# Patient Record
Sex: Male | Born: 1975 | Race: Black or African American | Hispanic: No | Marital: Single | Smoking: Current every day smoker
Health system: Southern US, Community
[De-identification: ages and names within clinical notes are randomized; demographics above are authoritative.]

## PROBLEM LIST (undated history)

## (undated) DIAGNOSIS — J189 Pneumonia, unspecified organism: Secondary | ICD-10-CM

## (undated) DIAGNOSIS — M869 Osteomyelitis, unspecified: Secondary | ICD-10-CM

## (undated) DIAGNOSIS — M87052 Idiopathic aseptic necrosis of left femur: Secondary | ICD-10-CM

## (undated) DIAGNOSIS — D5700 Hb-SS disease with crisis, unspecified: Secondary | ICD-10-CM

## (undated) DIAGNOSIS — IMO0001 Reserved for inherently not codable concepts without codable children: Secondary | ICD-10-CM

## (undated) DIAGNOSIS — M87059 Idiopathic aseptic necrosis of unspecified femur: Secondary | ICD-10-CM

## (undated) DIAGNOSIS — Z5189 Encounter for other specified aftercare: Secondary | ICD-10-CM

## (undated) HISTORY — PX: BONE GRAFT HIP ILIAC CREST: SUR159

---
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
## 1998-02-10 ENCOUNTER — Inpatient Hospital Stay (HOSPITAL_COMMUNITY): Admission: RE | Admit: 1998-02-10 | Discharge: 1998-02-13 | Payer: Self-pay | Admitting: Family Medicine

## 2000-02-06 ENCOUNTER — Inpatient Hospital Stay (HOSPITAL_COMMUNITY): Admission: EM | Admit: 2000-02-06 | Discharge: 2000-02-07 | Payer: Self-pay | Admitting: Emergency Medicine

## 2000-12-12 ENCOUNTER — Inpatient Hospital Stay (HOSPITAL_COMMUNITY): Admission: EM | Admit: 2000-12-12 | Discharge: 2000-12-19 | Payer: Self-pay

## 2000-12-13 ENCOUNTER — Encounter: Payer: Self-pay | Admitting: Internal Medicine

## 2000-12-16 ENCOUNTER — Encounter: Payer: Self-pay | Admitting: Internal Medicine

## 2000-12-19 ENCOUNTER — Encounter: Payer: Self-pay | Admitting: Internal Medicine

## 2001-04-25 ENCOUNTER — Inpatient Hospital Stay (HOSPITAL_COMMUNITY): Admission: EM | Admit: 2001-04-25 | Discharge: 2001-04-28 | Payer: Self-pay | Admitting: *Deleted

## 2001-06-27 ENCOUNTER — Inpatient Hospital Stay (HOSPITAL_COMMUNITY): Admission: EM | Admit: 2001-06-27 | Discharge: 2001-06-29 | Payer: Self-pay | Admitting: Emergency Medicine

## 2001-09-06 ENCOUNTER — Inpatient Hospital Stay (HOSPITAL_COMMUNITY): Admission: EM | Admit: 2001-09-06 | Discharge: 2001-09-09 | Payer: Self-pay

## 2001-09-06 ENCOUNTER — Encounter: Payer: Self-pay | Admitting: Internal Medicine

## 2002-05-12 ENCOUNTER — Encounter: Payer: Self-pay | Admitting: Emergency Medicine

## 2002-05-12 ENCOUNTER — Inpatient Hospital Stay (HOSPITAL_COMMUNITY): Admission: EM | Admit: 2002-05-12 | Discharge: 2002-05-14 | Payer: Self-pay | Admitting: Emergency Medicine

## 2002-09-30 ENCOUNTER — Encounter: Payer: Self-pay | Admitting: *Deleted

## 2002-09-30 ENCOUNTER — Inpatient Hospital Stay (HOSPITAL_COMMUNITY): Admission: EM | Admit: 2002-09-30 | Discharge: 2002-10-10 | Payer: Self-pay | Admitting: *Deleted

## 2002-10-03 ENCOUNTER — Encounter: Payer: Self-pay | Admitting: Family Medicine

## 2002-10-07 ENCOUNTER — Encounter: Payer: Self-pay | Admitting: Family Medicine

## 2002-10-10 ENCOUNTER — Encounter: Payer: Self-pay | Admitting: Family Medicine

## 2002-11-25 ENCOUNTER — Inpatient Hospital Stay (HOSPITAL_COMMUNITY): Admission: EM | Admit: 2002-11-25 | Discharge: 2002-12-05 | Payer: Self-pay | Admitting: Emergency Medicine

## 2002-11-25 ENCOUNTER — Encounter: Payer: Self-pay | Admitting: Emergency Medicine

## 2002-12-03 ENCOUNTER — Encounter: Payer: Self-pay | Admitting: Family Medicine

## 2003-08-13 ENCOUNTER — Inpatient Hospital Stay (HOSPITAL_COMMUNITY): Admission: EM | Admit: 2003-08-13 | Discharge: 2003-08-22 | Payer: Self-pay | Admitting: Emergency Medicine

## 2003-08-30 ENCOUNTER — Inpatient Hospital Stay (HOSPITAL_COMMUNITY): Admission: EM | Admit: 2003-08-30 | Discharge: 2003-09-02 | Payer: Self-pay | Admitting: Emergency Medicine

## 2003-10-16 ENCOUNTER — Emergency Department (HOSPITAL_COMMUNITY): Admission: EM | Admit: 2003-10-16 | Discharge: 2003-10-16 | Payer: Self-pay | Admitting: Emergency Medicine

## 2003-11-11 ENCOUNTER — Inpatient Hospital Stay (HOSPITAL_COMMUNITY): Admission: EM | Admit: 2003-11-11 | Discharge: 2003-11-26 | Payer: Self-pay | Admitting: Emergency Medicine

## 2003-12-18 ENCOUNTER — Emergency Department (HOSPITAL_COMMUNITY): Admission: EM | Admit: 2003-12-18 | Discharge: 2003-12-18 | Payer: Self-pay | Admitting: Emergency Medicine

## 2004-03-14 ENCOUNTER — Inpatient Hospital Stay (HOSPITAL_COMMUNITY): Admission: EM | Admit: 2004-03-14 | Discharge: 2004-03-17 | Payer: Self-pay | Admitting: *Deleted

## 2004-04-27 HISTORY — PX: JOINT REPLACEMENT: SHX530

## 2004-05-28 ENCOUNTER — Emergency Department (HOSPITAL_COMMUNITY): Admission: EM | Admit: 2004-05-28 | Discharge: 2004-05-28 | Payer: Self-pay | Admitting: Emergency Medicine

## 2004-05-30 ENCOUNTER — Inpatient Hospital Stay (HOSPITAL_COMMUNITY): Admission: EM | Admit: 2004-05-30 | Discharge: 2004-06-03 | Payer: Self-pay | Admitting: Family Medicine

## 2004-10-13 ENCOUNTER — Emergency Department (HOSPITAL_COMMUNITY): Admission: EM | Admit: 2004-10-13 | Discharge: 2004-10-13 | Payer: Self-pay | Admitting: Emergency Medicine

## 2004-11-17 ENCOUNTER — Emergency Department (HOSPITAL_COMMUNITY): Admission: EM | Admit: 2004-11-17 | Discharge: 2004-11-17 | Payer: Self-pay | Admitting: Emergency Medicine

## 2005-01-17 ENCOUNTER — Emergency Department (HOSPITAL_COMMUNITY): Admission: EM | Admit: 2005-01-17 | Discharge: 2005-01-17 | Payer: Self-pay | Admitting: Emergency Medicine

## 2005-02-20 ENCOUNTER — Inpatient Hospital Stay (HOSPITAL_COMMUNITY): Admission: RE | Admit: 2005-02-20 | Discharge: 2005-02-26 | Payer: Self-pay | Admitting: Orthopedic Surgery

## 2005-06-24 ENCOUNTER — Inpatient Hospital Stay (HOSPITAL_COMMUNITY): Admission: EM | Admit: 2005-06-24 | Discharge: 2005-06-27 | Payer: Self-pay | Admitting: Emergency Medicine

## 2005-08-31 ENCOUNTER — Inpatient Hospital Stay (HOSPITAL_COMMUNITY): Admission: EM | Admit: 2005-08-31 | Discharge: 2005-09-01 | Payer: Self-pay | Admitting: Emergency Medicine

## 2005-11-13 ENCOUNTER — Inpatient Hospital Stay (HOSPITAL_COMMUNITY): Admission: EM | Admit: 2005-11-13 | Discharge: 2005-11-14 | Payer: Self-pay | Admitting: Emergency Medicine

## 2006-01-05 ENCOUNTER — Emergency Department (HOSPITAL_COMMUNITY): Admission: EM | Admit: 2006-01-05 | Discharge: 2006-01-05 | Payer: Self-pay | Admitting: Emergency Medicine

## 2006-02-01 ENCOUNTER — Inpatient Hospital Stay (HOSPITAL_COMMUNITY): Admission: EM | Admit: 2006-02-01 | Discharge: 2006-02-03 | Payer: Self-pay | Admitting: Emergency Medicine

## 2006-03-01 ENCOUNTER — Emergency Department (HOSPITAL_COMMUNITY): Admission: EM | Admit: 2006-03-01 | Discharge: 2006-03-02 | Payer: Self-pay | Admitting: Emergency Medicine

## 2006-06-03 ENCOUNTER — Inpatient Hospital Stay (HOSPITAL_COMMUNITY): Admission: EM | Admit: 2006-06-03 | Discharge: 2006-06-05 | Payer: Self-pay | Admitting: Emergency Medicine

## 2006-07-21 ENCOUNTER — Emergency Department (HOSPITAL_COMMUNITY): Admission: EM | Admit: 2006-07-21 | Discharge: 2006-07-22 | Payer: Self-pay | Admitting: Emergency Medicine

## 2006-08-26 ENCOUNTER — Inpatient Hospital Stay (HOSPITAL_COMMUNITY): Admission: EM | Admit: 2006-08-26 | Discharge: 2006-08-30 | Payer: Self-pay | Admitting: Emergency Medicine

## 2006-10-30 ENCOUNTER — Inpatient Hospital Stay (HOSPITAL_COMMUNITY): Admission: EM | Admit: 2006-10-30 | Discharge: 2006-11-02 | Payer: Self-pay | Admitting: Emergency Medicine

## 2010-04-28 ENCOUNTER — Inpatient Hospital Stay (HOSPITAL_COMMUNITY)
Admission: EM | Admit: 2010-04-28 | Discharge: 2010-05-04 | Payer: Self-pay | Source: Home / Self Care | Attending: Internal Medicine | Admitting: Internal Medicine

## 2010-04-30 LAB — BASIC METABOLIC PANEL
BUN: 4 mg/dL — ABNORMAL LOW (ref 6–23)
CO2: 28 mEq/L (ref 19–32)
Calcium: 8.9 mg/dL (ref 8.4–10.5)
Chloride: 101 mEq/L (ref 96–112)
Creatinine, Ser: 0.74 mg/dL (ref 0.4–1.5)
GFR calc Af Amer: >60 mL/min (ref >60)
GFR calc non Af Amer: >60 mL/min (ref >60)
Glucose, Bld: 127 mg/dL — ABNORMAL HIGH (ref 70–99)
Potassium: 3.8 mEq/L (ref 3.5–5.1)
Sodium: 136 mEq/L (ref 135–145)

## 2010-04-30 LAB — CBC
HCT: 29.9 % — ABNORMAL LOW (ref 39.0–52.0)
Hemoglobin: 10.9 g/dL — ABNORMAL LOW (ref 13.0–17.0)
MCH: 34.2 pg — ABNORMAL HIGH (ref 26.0–34.0)
MCHC: 36.5 g/dL — ABNORMAL HIGH (ref 30.0–36.0)
MCV: 93.7 fL (ref 78.0–100.0)
Platelets: 209 10*3/uL (ref 150–400)
RBC: 3.19 MIL/uL — ABNORMAL LOW (ref 4.22–5.81)
RDW: 15.2 % (ref 11.5–15.5)
WBC: 19.6 10*3/uL — ABNORMAL HIGH (ref 4.0–10.5)

## 2010-04-30 LAB — DIFFERENTIAL
Basophils Absolute: 0.1 10*3/uL (ref 0.0–0.1)
Basophils Relative: 0 % (ref 0–1)
Eosinophils Absolute: 0.4 10*3/uL (ref 0.0–0.7)
Eosinophils Relative: 2 % (ref 0–5)
Lymphocytes Relative: 9 % — ABNORMAL LOW (ref 12–46)
Lymphs Abs: 1.8 10*3/uL (ref 0.7–4.0)
Monocytes Absolute: 1.8 10*3/uL — ABNORMAL HIGH (ref 0.1–1.0)
Monocytes Relative: 9 % (ref 3–12)
Neutro Abs: 15.6 10*3/uL — ABNORMAL HIGH (ref 1.7–7.7)
Neutrophils Relative %: 80 % — ABNORMAL HIGH (ref 43–77)

## 2010-05-01 LAB — CBC
HCT: 26.3 % — ABNORMAL LOW (ref 39.0–52.0)
Hemoglobin: 9.6 g/dL — ABNORMAL LOW (ref 13.0–17.0)
MCH: 34 pg (ref 26.0–34.0)
MCHC: 36.5 g/dL — ABNORMAL HIGH (ref 30.0–36.0)
MCV: 93.3 fL (ref 78.0–100.0)
Platelets: 195 10*3/uL (ref 150–400)
RBC: 2.82 MIL/uL — ABNORMAL LOW (ref 4.22–5.81)
RDW: 15.5 % (ref 11.5–15.5)
WBC: 16 10*3/uL — ABNORMAL HIGH (ref 4.0–10.5)

## 2010-05-01 LAB — DIFFERENTIAL
Basophils Absolute: 0.1 10*3/uL (ref 0.0–0.1)
Basophils Relative: 0 % (ref 0–1)
Eosinophils Absolute: 0.7 10*3/uL (ref 0.0–0.7)
Eosinophils Relative: 4 % (ref 0–5)
Lymphocytes Relative: 12 % (ref 12–46)
Lymphs Abs: 1.9 10*3/uL (ref 0.7–4.0)
Monocytes Absolute: 1.6 10*3/uL — ABNORMAL HIGH (ref 0.1–1.0)
Monocytes Relative: 10 % (ref 3–12)
Neutro Abs: 11.8 10*3/uL — ABNORMAL HIGH (ref 1.7–7.7)
Neutrophils Relative %: 74 % (ref 43–77)

## 2010-05-02 LAB — BASIC METABOLIC PANEL
BUN: 4 mg/dL — ABNORMAL LOW (ref 6–23)
CO2: 27 mEq/L (ref 19–32)
Calcium: 9 mg/dL (ref 8.4–10.5)
Chloride: 105 mEq/L (ref 96–112)
Creatinine, Ser: 0.61 mg/dL (ref 0.4–1.5)
GFR calc Af Amer: >60 mL/min (ref >60)
GFR calc non Af Amer: >60 mL/min (ref >60)
Glucose, Bld: 89 mg/dL (ref 70–99)
Potassium: 4.1 mEq/L (ref 3.5–5.1)
Sodium: 141 mEq/L (ref 135–145)

## 2010-05-02 LAB — CBC
HCT: 24.6 % — ABNORMAL LOW (ref 39.0–52.0)
Hemoglobin: 9.1 g/dL — ABNORMAL LOW (ref 13.0–17.0)
MCH: 34.1 pg — ABNORMAL HIGH (ref 26.0–34.0)
MCHC: 37 g/dL — ABNORMAL HIGH (ref 30.0–36.0)
MCV: 92.1 fL (ref 78.0–100.0)
Platelets: 190 10*3/uL (ref 150–400)
RBC: 2.67 MIL/uL — ABNORMAL LOW (ref 4.22–5.81)
RDW: 15.9 % — ABNORMAL HIGH (ref 11.5–15.5)
WBC: 16.5 10*3/uL — ABNORMAL HIGH (ref 4.0–10.5)

## 2010-05-12 LAB — CULTURE, BLOOD (ROUTINE X 2)
Culture  Setup Time: 201201040601
Culture  Setup Time: 201201040601
Culture: NO GROWTH
Culture: NO GROWTH

## 2010-06-05 NOTE — H&P (Signed)
NAMEDONTEE, JASO                ACCOUNT NO.:  [id number]  MEDICAL RECORD NO.:  [id number]          PATIENT TYPE:  EMS  LOCATION:  ED                           FACILITY:  [facility]  PHYSICIAN:  Calvert Cantor, M.D.     DATE OF BIRTH:  [birth year]  DATE OF ADMISSION:  04/28/2010 DATE OF DISCHARGE:                             HISTORY & PHYSICAL   PRIMARY CARE PHYSICIAN:  Fleet Contras, M.D.  PRESENTING COMPLAINT:  Chest pain.  HISTORY OF PRESENT ILLNESS:  This is a 35 year old male with sickle cell anemia, who comes in with a complaint of diffuse chest pain which started earlier today, described as 10/10.  He does have any other associated symptoms such as cough or shortness of breath, but he is having trouble taking a deep breath.  He has not had any fevers and has no other complaints.  The patient is being admitted for a sickle cell crisis.  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  FAMILY HISTORY:  Diabetes.  SOCIAL HISTORY:  Quit smoking on New Year's Eve, was smoking about a pack a day.  Drinks alcohol occasionally.  Does not admit to any drug use.  ALLERGIES:  No known drug allergies.  CURRENT MEDICATIONS:  None.  REVIEW OF SYSTEMS:  No recent weight loss or weight gain.  No complaints of headaches.  No blurred vision, double vision.  No sore throat, sinus trouble, earache.  RESPIRATORY:  No cough or shortness of breath. CARDIAC:  Positive for diffuse chest pain.  No palpitations.  No pedal edema.  GI:  No nausea, vomiting, diarrhea, or constipation. NEUROLOGICAL:  No focal numbness, weakness, tingling.  PSYCHOLOGICAL: No anxiety, depression.  PHYSICAL EXAMINATION:  GENERAL:  Young male lying in bed in moderate distress. VITAL SIGNS:  Blood pressure 141/93, pulse 84, respiratory rate 18, temperature 98.4, oxygen 97%. HEENT:  Pupils equal, round, reactive to light.  Extraocular  movements are intact.  Conjunctivae are pink.  No scleral icterus.  Oral mucosa is moist.  Normal dentition. NECK:  Supple.  No thyromegaly, lymphadenopathy, or carotid bruits. HEART:  Regular rate and rhythm.  No murmurs, rubs, or gallops. LUNGS:  Decreased breath sounds due to poor inspiration effort.  No rhonchi or wheezing. ABDOMEN:  Soft, nontender, nondistended.  Bowel sounds positive.  No organomegaly. EXTREMITIES:  No cyanosis, clubbing, or edema. NEUROLOGICAL:  Cranial nerves II through XII intact.  Strength intact in all 4 extremities. PSYCHOLOGICAL:  Awake, alert, oriented x3. SKIN:  Warm, dry.  No rash or bruising.  BLOOD WORK:  Abnormal blood work includes a WBC count of 15.2 and a hemoglobin of 11.6.  His retic count is normal at 2.1.  Metabolic panel is essentially normal.  He has had a chest x-ray 2-view which reveals scarring versus atelectasis in the left lung base and scarring in the right midlung. There is patchy sclerosis of humeral head suggestive of prior avascular necrosis.  ASSESSMENT AND PLAN: 1. Sickle cell crisis.  We will treat this with oxycodone and     Dilaudid,  IV fluids and oxygen.  We will need to monitor his     hemoglobin.  I am afraid that he is going to splint and may develop     a respiratory infection.  Therefore, once pain is controlled, we     will need to start incentive spirometry to prevent further     atelectasis. 2. Leukocytosis, possibly stress response.  We will go ahead and get a     UA to ensure that there is no infection and monitor for fevers as     well. 3. Anemia.  This is mild, continue to follow. 4. History of avascular necrosis status post bilateral hip     replacements. 5. Smoker.  We will get him a nicotine patch as he is trying to quit. 6. Deep venous thrombosis prophylaxis will be with Lovenox.  Time on admission 45 minutes.     Calvert Cantor, M.D.     SR/MEDQ  D:  04/28/2010  T:  04/28/2010  Job:   [id number]  cc:   Fleet Contras, M.D. Fax: [phone]  Electronically Signed by Calvert Cantor M.D. on 06/05/2010 12:21:38 PM

## 2010-07-07 LAB — BASIC METABOLIC PANEL
BUN: 16 mg/dL (ref 6–23)
BUN: 9 mg/dL (ref 6–23)
CO2: 27 mEq/L (ref 19–32)
CO2: 28 mEq/L (ref 19–32)
Calcium: 9.3 mg/dL (ref 8.4–10.5)
Chloride: 104 mEq/L (ref 96–112)
GFR calc non Af Amer: >60 mL/min (ref >60)
Glucose, Bld: 109 mg/dL — ABNORMAL HIGH (ref 70–99)
Glucose, Bld: 86 mg/dL (ref 70–99)
Potassium: 3.9 mEq/L (ref 3.5–5.1)
Potassium: 4.3 mEq/L (ref 3.5–5.1)
Sodium: 139 mEq/L (ref 135–145)
Sodium: 140 mEq/L (ref 135–145)

## 2010-07-07 LAB — URINALYSIS, ROUTINE W REFLEX MICROSCOPIC
Nitrite: NEGATIVE
Specific Gravity, Urine: 1.033 — ABNORMAL HIGH (ref 1.005–1.030)
Urobilinogen, UA: 1 mg/dL (ref 0.0–1.0)
pH: 6 (ref 5.0–8.0)

## 2010-07-07 LAB — CBC
Hemoglobin: 10.7 g/dL — ABNORMAL LOW (ref 13.0–17.0)
MCV: 94.3 fL (ref 78.0–100.0)
Platelets: 239 10*3/uL (ref 150–400)
Platelets: 301 10*3/uL (ref 150–400)
RBC: 3.36 MIL/uL — ABNORMAL LOW (ref 4.22–5.81)
WBC: 15.2 10*3/uL — ABNORMAL HIGH (ref 4.0–10.5)

## 2010-07-07 LAB — DIFFERENTIAL
Eosinophils Relative: 4 % (ref 0–5)
Lymphocytes Relative: 21 % (ref 12–46)
Lymphs Abs: 3.2 10*3/uL (ref 0.7–4.0)
Monocytes Relative: 11 % (ref 3–12)
Neutro Abs: 9.5 10*3/uL — ABNORMAL HIGH (ref 1.7–7.7)
Neutrophils Relative %: 63 % (ref 43–77)

## 2010-07-07 LAB — RAPID URINE DRUG SCREEN, HOSP PERFORMED
Cocaine: NOT DETECTED
Opiates: POSITIVE — AB

## 2010-07-09 ENCOUNTER — Inpatient Hospital Stay (HOSPITAL_COMMUNITY)
Admission: EM | Admit: 2010-07-09 | Discharge: 2010-07-18 | DRG: 812 | Disposition: A | Discharge status: Home / Self Care | Payer: Medicare Other | Attending: Internal Medicine | Admitting: Internal Medicine

## 2010-07-09 DIAGNOSIS — M79609 Pain in unspecified limb: Secondary | ICD-10-CM | POA: Diagnosis present

## 2010-07-09 DIAGNOSIS — D5700 Hb-SS disease with crisis, unspecified: Principal | ICD-10-CM | POA: Diagnosis present

## 2010-07-09 DIAGNOSIS — K5900 Constipation, unspecified: Secondary | ICD-10-CM | POA: Diagnosis present

## 2010-07-09 DIAGNOSIS — D72829 Elevated white blood cell count, unspecified: Secondary | ICD-10-CM | POA: Diagnosis present

## 2010-07-09 DIAGNOSIS — F17200 Nicotine dependence, unspecified, uncomplicated: Secondary | ICD-10-CM | POA: Diagnosis present

## 2010-07-09 LAB — CBC
HCT: 34.6 % — ABNORMAL LOW (ref 39.0–52.0)
MCH: 33.7 pg (ref 26.0–34.0)
MCV: 92.5 fL (ref 78.0–100.0)
Platelets: 268 10*3/uL (ref 150–400)
RBC: 3.74 MIL/uL — ABNORMAL LOW (ref 4.22–5.81)

## 2010-07-09 LAB — URINALYSIS, ROUTINE W REFLEX MICROSCOPIC
Glucose, UA: NEGATIVE mg/dL
Hgb urine dipstick: NEGATIVE
Specific Gravity, Urine: 1.009 (ref 1.005–1.030)
pH: 6.5 (ref 5.0–8.0)

## 2010-07-09 LAB — DIFFERENTIAL
Eosinophils Absolute: 0.9 10*3/uL — ABNORMAL HIGH (ref 0.0–0.7)
Eosinophils Relative: 8 % — ABNORMAL HIGH (ref 0–5)
Lymphocytes Relative: 23 % (ref 12–46)
Lymphs Abs: 2.8 10*3/uL (ref 0.7–4.0)
Monocytes Relative: 10 % (ref 3–12)
Neutrophils Relative %: 59 % (ref 43–77)

## 2010-07-09 LAB — RETICULOCYTES: Retic Ct Pct: 2.4 % (ref 0.4–3.1)

## 2010-07-09 LAB — BASIC METABOLIC PANEL
BUN: 10 mg/dL (ref 6–23)
CO2: 28 mEq/L (ref 19–32)
Chloride: 104 mEq/L (ref 96–112)
Creatinine, Ser: 0.63 mg/dL (ref 0.4–1.5)
Glucose, Bld: 81 mg/dL (ref 70–99)

## 2010-07-10 LAB — DIFFERENTIAL
Basophils Absolute: 0.1 10*3/uL (ref 0.0–0.1)
Eosinophils Relative: 8 % — ABNORMAL HIGH (ref 0–5)
Lymphocytes Relative: 31 % (ref 12–46)
Monocytes Relative: 10 % (ref 3–12)
Neutrophils Relative %: 50 % (ref 43–77)

## 2010-07-10 LAB — CBC
Hemoglobin: 10.4 g/dL — ABNORMAL LOW (ref 13.0–17.0)
MCH: 34 pg (ref 26.0–34.0)
Platelets: 213 10*3/uL (ref 150–400)
RBC: 3.06 MIL/uL — ABNORMAL LOW (ref 4.22–5.81)

## 2010-07-10 LAB — RETICULOCYTES
RBC.: 3.06 MIL/uL — ABNORMAL LOW (ref 4.22–5.81)
Retic Ct Pct: 3 % (ref 0.4–3.1)

## 2010-07-11 LAB — RETICULOCYTES
Retic Count, Absolute: 77 10*3/uL (ref 19.0–186.0)
Retic Ct Pct: 2.4 % (ref 0.4–3.1)

## 2010-07-11 LAB — CBC
MCH: 33 pg (ref 26.0–34.0)
MCV: 93.5 fL (ref 78.0–100.0)
Platelets: 225 10*3/uL (ref 150–400)
RBC: 3.21 MIL/uL — ABNORMAL LOW (ref 4.22–5.81)
RDW: 15.5 % (ref 11.5–15.5)
WBC: 10.3 10*3/uL (ref 4.0–10.5)

## 2010-07-11 LAB — LACTATE DEHYDROGENASE: LDH: 149 U/L (ref 94–250)

## 2010-07-12 LAB — CBC
Hemoglobin: 10.5 g/dL — ABNORMAL LOW (ref 13.0–17.0)
MCH: 33.2 pg (ref 26.0–34.0)
MCHC: 35.6 g/dL (ref 30.0–36.0)
MCV: 93.4 fL (ref 78.0–100.0)
RBC: 3.16 MIL/uL — ABNORMAL LOW (ref 4.22–5.81)

## 2010-07-13 LAB — CBC
HCT: 29.2 % — ABNORMAL LOW (ref 39.0–52.0)
MCH: 33.2 pg (ref 26.0–34.0)
MCHC: 35.6 g/dL (ref 30.0–36.0)
MCV: 93.3 fL (ref 78.0–100.0)
Platelets: 243 10*3/uL (ref 150–400)
RDW: 15.6 % — ABNORMAL HIGH (ref 11.5–15.5)

## 2010-07-14 LAB — CROSSMATCH
Antibody Screen: NEGATIVE
Unit division: 0

## 2010-07-14 LAB — CBC
HCT: 29.9 % — ABNORMAL LOW (ref 39.0–52.0)
MCH: 32.1 pg (ref 26.0–34.0)
MCHC: 35.1 g/dL (ref 30.0–36.0)
MCV: 91.4 fL (ref 78.0–100.0)
RDW: 15.9 % — ABNORMAL HIGH (ref 11.5–15.5)
WBC: 9.5 10*3/uL (ref 4.0–10.5)

## 2010-07-15 LAB — CBC
Hemoglobin: 10.5 g/dL — ABNORMAL LOW (ref 13.0–17.0)
MCH: 32.5 pg (ref 26.0–34.0)
MCHC: 35.1 g/dL (ref 30.0–36.0)
RDW: 15.9 % — ABNORMAL HIGH (ref 11.5–15.5)

## 2010-07-15 LAB — RETICULOCYTES: Retic Ct Pct: 2.6 % (ref 0.4–3.1)

## 2010-07-15 NOTE — H&P (Addendum)
[name], [name]                ACCOUNT NO.:  [id number]  MEDICAL RECORD NO.:  [id number]           PATIENT TYPE:  E  LOCATION:  WLED                         FACILITY:  [facility]  PHYSICIAN:  Homero Fellers, MD   DATE OF BIRTH:  09-Feb-1976  DATE OF ADMISSION:  07/09/2010 DATE OF DISCHARGE:                             HISTORY & PHYSICAL   PRIMARY CARE PHYSICIAN:  Fleet Contras, M.D.  The patient is being admitted to [facility].  CHIEF COMPLAINT:  Right arm pain.  HISTORY OF PRESENT ILLNESS:  [name] is a very pleasant 35 year old male with a history of sickle cell anemia who presents to the [facility] ED with a chief complaint of right arm pain.  Information is obtained from the patient.  He states that 2 days ago he developed a sharp, nonradiating pain in his right shoulder that was worse with movement.  He indicates that over the last 2 days this pain has worsened in intensity and spread to diffusely throughout the arm and down to his right wrist and right hand.  He does indicate that he has had the same type of pain in previous sickle cell crises, but it has never gone down to his hand.  He indicates that holding his arm up over his head is the most comfortable position and down by his side is the worst.  He rates the pain at 10/10 on presentation and at the time of my interview after pain medication an 8/10.  The patient does deny recent fever, chills, nausea, vomiting, diarrhea.  He also denies any chest pain, palpitation, cough, shortness of breath.  He indicates that his last crisis was in January of this year.  He was hospitalized at [facility] for 5 days at that time.  He indicates that he is compliant with his medications and that he quit smoking in January and has been wearing the patch ever since.  He continued with his home medications that he uses for pain without relief.  Today he awakened and the pain was so intense he was unable  to use his arm.  He decided to come to the emergency room.  ALLERGIES:  NO KNOWN DRUG ALLERGIES.  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  SOCIAL HISTORY:  He is single.  He quit smoking in January of this year. Prior to that he smoked about a pack a day for many years.  He has an occasional EtOH.  He denies any drug use.  FAMILY HISTORY:  Family medical history includes diabetes.  MEDICATIONS: 1. Phenergan 25 mg p.o. one tablet every 12 hours as needed for     nausea. 2. Nicotine patch 14 mg/24 hours transdermal one patch daily. 3. Morphine sulfate 30 mg p.o. one tablet every 4 hours as needed for     pain. 4. Ibuprofen 800 mg p.o. t.i.d. as needed for pain. 5. Hydroxyurea 500 mg p.o. daily. 6. Folic acid 1 mg p.o. daily. 7. Albuterol inhaler two puffs every 4 hours as needed  for shortness     of breath. 8. Morphine sulfate CR 60 mg p.o. one tablet every 12 hours.  REVIEW OF SYSTEMS:  GENERAL:  Denies fever, chills, anorexia or unintentional weight loss.  ENT:  Denies any ear pain, nasal congestion or sore throat.  CARDIOVASCULAR:  Denies chest pain, palpitations or lower extremity edema.  RESPIRATORY:  Denies any shortness of breath or cough.  MUSCULOSKELETAL:  See HPI.  NEUROLOGIC:  Denies headache, visual disturbances, numbness or tingling of extremities.  GASTROINTESTINAL: Denies any abdominal pain, nausea, vomiting, diarrhea or constipation. GENITOURINARY:  Denies dysuria, hematuria, frequency or urgency. PSYCHIATRIC:  Denies depression or anxiety.  HEMATOLOGIC:  Denies any unusual bruising or bleeding.  LABORATORY DATA:  Sodium 137, potassium 3.6, chloride 104, CO2 28, BUN 10, creatinine 0.63, glucose 81.  WBC is 12.0, hemoglobin 12.6, hematocrit 34.6, platelets 268, reticulocyte percentage 2.4, RBCs 3.77, absolute reticulocytes 90.5.  RADIOLOGIC:  EKG reviewed by Dr. Phineas Douglas  shows normal sinus rhythm at 60 a minute.  PHYSICAL EXAMINATION:  VITAL SIGNS:  Temperature 98.0, blood pressure 106/68, heart rate is 62, respiratory rate is 18, saturations 96% on room air. GENERAL:  Awake, alert, sitting up in bed, well-nourished, well- hydrated, in no acute distress. HEENT:  Head normocephalic, atraumatic.  Pupils equal, round and reactive to light.  EOMI.  Mucous membranes of his mouth are pink, slightly dry.  No obvious lesion or exudate in nose or ears. NECK:  Supple.  No JVD.  Full range of motion.  No lymphadenopathy. CARDIOVASCULAR:  Regular rate and rhythm.  No murmur, gallop or rub.  No lower extremity edema.  Pedal pulses present and palpable. RESPIRATORY:  No increased work of breathing.  Breath sounds clear to auscultation bilaterally.  No rhonchi, wheezes or rales. ABDOMEN:  Flat, soft, positive bowel sounds throughout.  Nontender to palpation.  No mass or organomegaly noted. MUSCULOSKELETAL:  Moves all extremities.  Joints without swelling/erythema.  Right shoulder without swelling or erythema.  Full range of motion.  Bilateral upper extremity strength 5/5. EXTREMITIES:  Without clubbing or cyanosis.  ASSESSMENT AND PLAN: 1. Sickle cell crisis.  Admit to floor.  We will hydrate vigorously     with half normal saline.  Provide oxygen support and provide pain     medicine, specifically Dilaudid 2 mg intravenous every two hours.     We will continue his home MS Contin at 30 mg per oral every 12.  We     will monitor effectiveness of this pain regimen and adjust as     needed.  We will monitor his hemoglobin. 2. Leukocytosis.  White count was 12.0.  Probably stress response.     The patient is currently afebrile with normal sinus rhythm.  We     will check a urinalysis.  No symptoms of cough or shortness of     breath.  We will monitor closely for fever. 3. Anemia.  Mild.  Hemoglobin 12.6.  We will monitor closely. 4. History of avascular necrosis  status post bilateral hip     replacement. 5. Former smoker.  The patient quit smoking in January and currently     uses nicotine patch.  We will continue. 6. Deep vein thrombosis prophylaxis.  We will use Lovenox. 7. Code status.  The patient is a Full Code.  This assessment and plan was discussed with Dr. Phineas Douglas.  It was truly a pleasure taking care of Mr. Joos.     Gwenyth Bender, NP  ______________________________ Homero Fellers, MD    KMB/MEDQ  D:  07/09/2010  T:  07/09/2010  Job:  [id number]  cc:   Fleet Contras, M.D. Fax: [phone]  Electronically Signed by Homero Fellers  on 07/15/2010 02:16:12 AM Electronically Signed by Toya Smothers  on 07/17/2010 10:42:34 AM

## 2010-07-15 NOTE — Progress Notes (Signed)
  [name], [name]                ACCOUNT NO.:  [id number]  MEDICAL RECORD NO.:  [id number]           PATIENT TYPE:  I  LOCATION:  1339                         FACILITY:  [facility]  PHYSICIAN:  Hillery Aldo, M.D.   DATE OF BIRTH:  [birth year]                                PROGRESS NOTE   DATE OF DISCHARGE: Pending.  PRIMARY CARE PHYSICIAN: Fleet Contras, M.D.  CURRENT DIAGNOSES: 1. Sickle cell anemia with vaso-occlusive crisis. 2. Constipation. 3. 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 4. Leukocytosis. 5. History of tobacco abuse.  DISCHARGE MEDICATIONS: Will be dictated at the time of actual discharge.  CONSULTATIONS: None.  BRIEF ADMISSION HISTORY OF PRESENT ILLNESS: The patient is a 35 year old male with past medical history of sickle cell anemia who presented to the hospital with a chief complaint of severe right upper extremity pain consistent with his usual presentation of vaso-occlusive crisis.  When his pain could not be adequately controlled in the emergency department, he was referred to the hospitalist service for further inpatient evaluation and stabilization. For full details, please see the dictated report done by Dr. Phineas Douglas.  PROCEDURES AND DIAGNOSTIC STUDIES: None.  DISCHARGE LABORATORY VALUES: Will be dictated at the time of actual discharge.  HOSPITAL COURSE: 1. Sickle cell anemia/vaso-occlusive crisis:  The patient was admitted     and put on a combination of IV fluids, intravenous pain     medications, and p.o. folic acid.  Because of ongoing symptoms of     severe right upper extremity pain, the patient was given an     exchange transfusion on July 13, 2010, which had a mild     improvement in his presenting complaints.  At this point, the     patient is still requiring IV narcotics for pain control and is not     yet stable for discharge.  Discharge summary addendum will be     dictated when he is actually discharged. 2.  Constipation:  The patient responds to sorbitol p.r.n. 3. Anemia secondary to sickle cell anemia:  The patient is to receive     exchange transfusion of 350 cc.  Hemoglobin has been stable. 4. Former tobacco abuse:  The patient has been maintained on nicotine     patch.  DISPOSITION: The patient will be discharged home once he is successfully weaned from IV narcotics and his pain can be adequately controlled with p.o. narcotics.  A discharge summary addendum will be dictated at that time.     Hillery Aldo, M.D.     CR/MEDQ  D:  07/15/2010  T:  07/15/2010  Job:  [id number]  cc:   Fleet Contras, M.D. Fax: [phone]  Electronically Signed by Hillery Aldo M.D. on 07/15/2010 03:31:13 PM

## 2010-07-16 LAB — BASIC METABOLIC PANEL
BUN: 9 mg/dL (ref 6–23)
Calcium: 9.1 mg/dL (ref 8.4–10.5)
GFR calc non Af Amer: >60 mL/min (ref >60)
Glucose, Bld: 100 mg/dL — ABNORMAL HIGH (ref 70–99)
Potassium: 4.1 mEq/L (ref 3.5–5.1)

## 2010-07-16 LAB — CBC
HCT: 28.2 % — ABNORMAL LOW (ref 39.0–52.0)
MCHC: 35.5 g/dL (ref 30.0–36.0)
MCV: 92.5 fL (ref 78.0–100.0)
RDW: 15.8 % — ABNORMAL HIGH (ref 11.5–15.5)

## 2010-07-17 LAB — COMPREHENSIVE METABOLIC PANEL
ALT: 33 U/L (ref 0–53)
AST: 50 U/L — ABNORMAL HIGH (ref 0–37)
Albumin: 3.9 g/dL (ref 3.5–5.2)
Alkaline Phosphatase: 83 U/L (ref 39–117)
CO2: 28 mEq/L (ref 19–32)
Chloride: 103 mEq/L (ref 96–112)
Creatinine, Ser: 0.77 mg/dL (ref 0.4–1.5)
GFR calc Af Amer: >60 mL/min (ref >60)
GFR calc non Af Amer: >60 mL/min (ref >60)
Potassium: 4.2 mEq/L (ref 3.5–5.1)
Sodium: 138 mEq/L (ref 135–145)
Total Bilirubin: 2.2 mg/dL — ABNORMAL HIGH (ref 0.3–1.2)

## 2010-07-17 LAB — DIFFERENTIAL
Basophils Absolute: 0.2 10*3/uL — ABNORMAL HIGH (ref 0.0–0.1)
Basophils Absolute: 0.1 10*3/uL (ref 0.0–0.1)
Basophils Relative: 2 % — ABNORMAL HIGH (ref 0–1)
Basophils Relative: 1 % (ref 0–1)
Eosinophils Absolute: 1 10*3/uL — ABNORMAL HIGH (ref 0.0–0.7)
Eosinophils Relative: 9 % — ABNORMAL HIGH (ref 0–5)
Lymphs Abs: 3.2 10*3/uL (ref 0.7–4.0)
Monocytes Absolute: 1.4 10*3/uL — ABNORMAL HIGH (ref 0.1–1.0)
Monocytes Relative: 13 % — ABNORMAL HIGH (ref 3–12)
Monocytes Relative: 13 % — ABNORMAL HIGH (ref 3–12)
Neutro Abs: 3.4 10*3/uL (ref 1.7–7.7)

## 2010-07-17 LAB — CBC
HCT: 28.1 % — ABNORMAL LOW (ref 39.0–52.0)
MCH: 32.4 pg (ref 26.0–34.0)
MCH: 32.2 pg (ref 26.0–34.0)
MCHC: 35.4 g/dL (ref 30.0–36.0)
MCV: 91.8 fL (ref 78.0–100.0)
Platelets: 246 10*3/uL (ref 150–400)
RBC: 3.06 MIL/uL — ABNORMAL LOW (ref 4.22–5.81)
RDW: 15.7 % — ABNORMAL HIGH (ref 11.5–15.5)

## 2010-07-18 LAB — DIFFERENTIAL
Basophils Absolute: 0.1 10*3/uL (ref 0.0–0.1)
Basophils Relative: 1 % (ref 0–1)
Eosinophils Absolute: 1.1 10*3/uL — ABNORMAL HIGH (ref 0.0–0.7)
Neutro Abs: 5.1 10*3/uL (ref 1.7–7.7)
Neutrophils Relative %: 50 % (ref 43–77)

## 2010-07-18 LAB — CBC
Hemoglobin: 9.8 g/dL — ABNORMAL LOW (ref 13.0–17.0)
MCH: 32.2 pg (ref 26.0–34.0)
MCHC: 34.9 g/dL (ref 30.0–36.0)
Platelets: 239 10*3/uL (ref 150–400)
RBC: 3.04 MIL/uL — ABNORMAL LOW (ref 4.22–5.81)

## 2010-07-21 LAB — PROTEIN ELECTROPH W RFLX QUANT IMMUNOGLOBULINS
Alpha-2-Globulin: 7.5 % (ref 7.1–11.8)
Gamma Globulin: 17.6 % (ref 11.1–18.8)
M-Spike, %: NOT DETECTED g/dL
Total Protein ELP: 7.5 g/dL (ref 6.0–8.3)

## 2010-08-05 NOTE — Discharge Summary (Signed)
[name], BOEHNING                ACCOUNT NO.:  [id number]  MEDICAL RECORD NO.:  [id number]           PATIENT TYPE:  I  LOCATION:  1339                         [facility]ILITY:  [facility]  PHYSICIAN:  Altha Harm, MDDATE OF BIRTH:  [birth year]  DATE OF ADMISSION:  07/09/2010 DATE OF DISCHARGE:  07/18/2010                              DISCHARGE SUMMARY   DISCHARGE DISPOSITION:  Home.  FINAL DISCHARGE DIAGNOSES: 1. Exacerbation of hemoglobin SS with vaso-occlusive crisis. 2. Right arm pain secondary to exacerbation of hemoglobin SS with vaso-     occlusive crisis. 3. Anemia status post exchange transfusion secondary to hemoglobin SS     vaso-occlusive episode. 4. Tobacco use disorder.  DISCHARGE MEDICATIONS:  Include the following: 1. Multivitamin with minerals 1 tablet p.o. daily. 2. Senokot 1 to 2 tabs p.o. as needed for constipation by mouth. 3. Albuterol inhaler 2 puffs p.o. q.4 h p.r.n. shortness of breath. 4. Folic acid 1 mg p.o. daily. 5. Hydrea 500 mg p.o. daily. 6. Ibuprofen 800 mg p.o. t.i.d. p.r.n. pain. 7. Morphine sulfate controlled release 60 mg p.o. q.12 h 8. Morphine sulfate instant release 30 mg p.o. q.4 h p.r.n. pain. 9. Nicotine patch 40 mg of 24 hours transdermally applied daily. 10.Phenergan 25 mg p.o. q.12 h p.r.n. nausea.  CONSULTANTS:  None.  PROCEDURES:  Status post exchange transfusion of 350 cc.  DIAGNOSTIC STUDIES:  None.  PRIMARY CARE PHYSICIAN:  Fleet Contras, M.D.  CODE STATUS:  Full code.  ALLERGIES:  No known drug allergies.  CHIEF COMPLAINT:  Right arm pain.  HISTORY OF PRESENT ILLNESS:  Please refer to the H and P dictated by Dr. Phineas Douglas for details of the HPI; however, in short, this is a 35 year old gentleman with a history of hemoglobin SS who presented to the hospital with a chief complaint of severe right upper extremity pain consistent with his usual presentation of vaso-occlusive episodes.  His pain could not be adequately  controlled in the emergency department and he was referred to the [facility] for further inpatient evaluation and stabilization.  HOSPITAL COURSE: 1. Hemoglobin SS with vaso-occlusive episode leading to right arm     pain.  The patient was started on IV hydration and analgesics.  The     patient was given long-acting morphine sulfate in addition to IV     Dilaudid.  The patient had minimal control of his pain and required     an exchange transfusion.  When his pain was not adequately     controlled and it did not appear to be preliminary response, the     patient received an exchange transfusion of 350 cc of blood.  He     had some improvement in pain.  It was noticed that the patient was     likely being on a dose of his narcotics as he normally takes the MS     Contin 60 mg q.12 h.  The long-acting was increased to 60 mg q.12 h     and the patient was resumed on his usual oral medication for     breakthrough pain.  He required minimal doses of Dilaudid once that     oral medication was increased.  The patient was then in the last 12     hours able to tolerate his pain without any need for IV     medications.  The patient states that his pain is usually 0 to 4 at     home and with his medications today, his pain has been down to     about the level of 4.  The patient is actually requesting to go     home and feels that he cannot adequately manage his pain at home.     During this hospitalization, the patient also received 24 hours of     Toradol on q.6 basis in place of his ibuprofen which appeared to     have assisted in ameliorating his pain.  The patient is tolerating     diet well.  He is able to ambulate without any difficulty.  He has     had no need for supplemental oxygen and reports that he is     comfortable at this time. 2. Anemia.  See above. 3. Tobacco use disorder.  The patient has been counseled against     further tobacco use and he is on a nicotine  patch at home.  LABORATORY STUDIES:  At the time of discharge, white blood cell count 10.4, hemoglobin 9.8, hematocrit 24.1, platelet count 239.  Reticulocyte percentage 2.9, absolute reticulocyte 88.7.  CONDITION AT TIME OF DISCHARGE:  Stable.  DISCHARGE PHYSICAL EXAMINATION:  VITAL SIGNS:  Temperature is 98.7, heart rate 73, blood pressure 110/82, respiratory rate 20, O2 sats are 96% on room air. GENERAL:  In general, the patient is well appearing. HEENT EXAMINATION:  He is normocephalic, atraumatic.  Pupils are equally round and reactive to light and accommodation.  Extraocular movements are intact.  Oropharynx is moist.  No exudate, erythema or lesions are noted. NECK EXAMINATION:  Trachea is midline.  No masses, no thyromegaly, no JVD, no carotid bruit. LUNGS:  Clear to auscultation.  No wheezing or rhonchi noted. CARDIOVASCULAR EXAM:  Normal S1 and S2.  No murmurs, rubs or gallops. PMI is nondisplaced.  No heaves or thrills on palpation. ABDOMEN:  Obese, soft, nontender, nondistended.  No masses, no hepatosplenomegaly noted. EXTREMITIES:  Showed no clubbing, cyanosis or edema. MUSCULOSKELETAL:  No warmth, swelling or erythema around the joints and the patient has full range of motion.  DIETARY RESTRICTIONS:  None.  PHYSICAL RESTRICTIONS:  None.  FOLLOWUP:  The patient to follow up with Dr. Concepcion Elk in 1 week.  Total time to coordinate this discharge including face-to-face time approximately 40 minutes.     Altha Harm, MD     MAM/MEDQ  D:  07/18/2010  T:  07/19/2010  Job:  [id number]  cc:   Fleet Contras, M.D. Fax: [phone]  Electronically Signed by Marthann Schiller MD on 08/05/2010 08:26:07 PM

## 2010-09-10 ENCOUNTER — Emergency Department (HOSPITAL_COMMUNITY)
Admission: EM | Admit: 2010-09-10 | Discharge: 2010-09-10 | Disposition: A | Discharge status: Home / Self Care | Payer: Medicare Other | Attending: Emergency Medicine | Admitting: Emergency Medicine

## 2010-09-10 DIAGNOSIS — R040 Epistaxis: Secondary | ICD-10-CM | POA: Insufficient documentation

## 2010-09-10 DIAGNOSIS — M549 Dorsalgia, unspecified: Secondary | ICD-10-CM | POA: Insufficient documentation

## 2010-09-10 DIAGNOSIS — D571 Sickle-cell disease without crisis: Secondary | ICD-10-CM | POA: Insufficient documentation

## 2010-09-10 DIAGNOSIS — R51 Headache: Secondary | ICD-10-CM | POA: Insufficient documentation

## 2010-09-10 LAB — CBC
Hemoglobin: 12.1 g/dL — ABNORMAL LOW (ref 13.0–17.0)
MCH: 33.2 pg (ref 26.0–34.0)
Platelets: 292 10*3/uL (ref 150–400)
RBC: 3.64 MIL/uL — ABNORMAL LOW (ref 4.22–5.81)
WBC: 13.6 10*3/uL — ABNORMAL HIGH (ref 4.0–10.5)

## 2010-09-12 NOTE — Discharge Summary (Signed)
[facility]  Patient:    [name], [name] Visit Number: [id number] MRN: [id number]          Service Type: MED Location: [id number] Attending Physician:  Rosanne Sack Dictated by:   Rosanne Sack, M.D. Admit Date:  04/25/2001 Discharge Date: 04/28/2001   CC:         Meredith Staggers, M.D.   Discharge Summary  DATE OF BIRTH:  1975/06/10.  ATTENDING PHYSICIAN:  Rosanne Sack, M.D.  DISCHARGE DIAGNOSES: 1. Acute sickle cell crisis. 2. Macrocytic anemia, secondary to problem #1.    a. Discharge hemoglobin 12.1, MCV 98. 3. History of sickle cell anemia. 4. History of trilobar community acquired pneumonia in August 2002.  DISCHARGE MEDICATIONS: 1. OxyContin 40 mg p.o. b.i.d. 2. OxyIR 10 mg p.o. q. 4 hours p.r.n. break through pain. 3. Folic acid 1 mg p.o. q.d.  DISCHARGE FOLLOW UP/DISPOSITION:  The patient will be followed by Dr. Arsenio Loader in [facility] within three to five days.  We recommend to reassess the patient from the crisis standpoint.  This time he presented with complaints of left hip and leg pain.  There was no evidence of infection.  CONSULTATIONS:  None.  PROCEDURES:  None.  DISCHARGE LABORATORY DATA:  Hemoglobin 12.1, MCV 98, white blood cell count 13.9, platelet count 256K.  Total bilirubin 2.9, sodium 136, potassium 3.8, chloride 101, cO2 30, BUN 6, creatinine 0.8, glucose 113.  Absolute reticulocyte count 108.1, percentage retic 2.8%.  HOSPITAL COURSE:  The patient is a very pleasant 35 year old gentleman with sickle cell anemia who presented with acute crisis on 04/25/01.  Please see admission report of history and physical by Dr. Marcelino Duster for further details regarding the history at presentation, physical examination and laboratory data.  #1 - SICKLE CELL CRISIS:  The patient was admitted to receive intravenous fluids and pain control.  No evidence of an infectious  process was noted throughout his hospital stay.  With intravenous fluids the hemoglobin came down from 13.7 to 12.1 without evidence of bleed.  The total bilirubin was 2.9.  With supportive care, the patients pain started to improve by day 2. On day 3 his pain was completely resolved.  The patients symptoms were mostly localized to the left lower extremity.  Of importance is that this patient was hospitalized in August of 2002 with sickle cell crisis induced by a trilobar pneumonia.  Throughout his hospital stay on prior admission, there was no evidence of chest pain nor upper respiratory tract symptoms.  No chest x-ray was obtained.  No urinalysis nor blood ______ were obtained.  Once again with supportive care, the clinical symptoms of left lower extremity pain improved and then almost resolved on the day of discharge.  The patient was placed on OxyContin two days prior to discharge.  The OxyContin was increased the day prior to discharge noticing an almost resolution of the pain syndrome.  Upon discharge Dr. Arsenio Loader will continue monitoring the patient for the sickle cell anemia standpoint.  A follow up hemoglobin may be recommended to confirm the stable anemia associated with sickle cell.  The patient was able to tolerate a regular diet without complications.  Folic acid was used during this hospital stay and then prescribed upon discharge.  I spent about 40 minutes in the discharge process of this patient.  MEDICAL CONDITION AT TIME OF DISCHARGE:  Improved. Dictated by:   Rosanne Sack, M.D. Attending Physician:  Rosanne Sack DD:  04/28/01 TD:  04/28/01 Job: [id number]

## 2010-09-12 NOTE — Discharge Summary (Signed)
[name], STIFF                ACCOUNT NO.:  [id number]   MEDICAL RECORD NO.:  [id number]          PATIENT TYPE:  INP   LOCATION:  1305                         FACILITY:  [facility]   PHYSICIAN:  Fleet Contras, M.D.    DATE OF BIRTH:  [birth year]   DATE OF ADMISSION:  02/01/2006  DATE OF DISCHARGE:  02/03/2006                                 DISCHARGE SUMMARY   HISTORY OF PRESENT ILLNESS:  Mr. 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  He  came to the emergency room with progressively worsening pain in the right  shoulder of two days duration which was not relieved by his usual home  regimen of MS Contin and MSIR.  In the emergency room, he received  intravenous morphine but his pain was still greater than 8 out of a possible  10 and was therefore admitted for pain medication as well as evaluation.   HOSPITAL COURSE:  On admission, his vital signs were stable.  He was  continued on his home regimen of MS Contin 60 mg daily.  He also received IV  morphine sulfate 5 mg q.4h., IV Phenergan 12.5 mg q.4h. and ibuprofen 800 mg  t.i.d. and Flexeril 10 mg t.i.d.  A MRI scan of the right shoulder was  performed and did show evidence of avascular necrosis with no evidence of  femoral head collapse.  An orthopedic consult was requested and the patient  was seen by Dr. Shelle Iron who suggested gentle activity as tolerated and to  follow up with Dr. Charlann Boxer in the office in two weeks.  His pain condition  improved and he was able to be considered for discharge on February 03, 2006.   ADMISSION DIAGNOSES:  1. Avascular necrosis of the right shoulder.  2. Sickle cell anemia.   DISCHARGE DIAGNOSES:  1. Avascular necrosis of the right shoulder.  2. Sickle cell anemia.   CONDITION ON DISCHARGE:  Stable.   DISCHARGE MEDICATIONS:  1. MS Contin 60 mg q.12h.  2. MSIR  50 mg 1 q.6h. p.r.n.  3. Ibuprofen 800 t.i.d. p.r.n.  4. Flexeril 10 mg t.i.d. p.r.n.  5. Phenergan 25 mg q.6h. p.r.n.  6. Heating pad p.r.n.  7. Lidoderm patch apply q.12h. p.r.n.   FOLLOW UP:  He is to follow up with me in two to four weeks and with Dr.  Shelle Iron and Dr. Charlann Boxer in two weeks.  His plan of care was discussed with him  and his questions were answered.      Fleet Contras, M.D.  Electronically Signed     EA/MEDQ  D:  02/28/2006  T:  02/28/2006  Job:  [id number]

## 2010-09-12 NOTE — Discharge Summary (Signed)
NAMEJOHNDANIEL, CATLIN                ACCOUNT NO.:  [id number]   MEDICAL RECORD NO.:  [id number]          PATIENT TYPE:  INP   LOCATION:  0351                         FACILITY:  [facility]   PHYSICIAN:  Lorelle Formosa, M.D.DATE OF BIRTH:  1975/08/18   DATE OF ADMISSION:  03/14/2004  DATE OF DISCHARGE:  03/17/2004                                 DISCHARGE SUMMARY   ADMISSION DIAGNOSIS:  SS sickle cell vaso-occlusive crisis.   DISCHARGE DIAGNOSIS:  SS sickle cell vaso-occlusive crisis.   CONDITION ON DISCHARGE:  Stable.   DISPOSITION:  Follow up in approximately one to two weeks.   DISCHARGE MEDICATIONS:  1.  OxyContin CR 20 mg b.i.d. p.r.n. pain.  2.  Oxy-IR 5 mg q.i.d. p.r.n. breakthrough pain.   DISCHARGE STATUS:  Stable and comfortable on oral medications.   HISTORY:  This patient is a 35 year old black male who has known SS sickle  cell disease. He was admitted to the emergency room with complaint of pain  in his left upper extremity onset the day prior and unrelieved by his home  medications. Home medications included OxyContin CR 20 mg b.i.d. and Oxy-IR.  He was seen by the emergency department  physician and given Dilaudid 2 mg  with 12.5 mg of Phenergan at three rounds from 8 a.m. to 1:30 p.m. He did  not relief at home and therefore was referred to me for further inpatient  care. The patient also uses folate 1 mg daily at home.  He denied any fever,  chills, shakes, coughing, sweats, or other type of symptoms. He does not  have any significant chest pain or significant back pain. He admits smoking  some, but no use of alcohol or drugs.   PAST MEDICAL HISTORY:  The patient has had multiple admissions for sickle  cell-related illnesses. He has a history of avascular necrosis in the past.  He has not had any operations and has no known allergies.   FAMILY HISTORY:  The patient is adopted, but lives with his father as his  mother is deceased at this time.   REVIEW OF  SYSTEMS:  Unremarkable except for present illness associated. The  patient is a high school graduate. He is not married and is not employed at  the moment.   PERSONAL HISTORY:  The patient smokes. No alcohol, drugs, or pica.   PHYSICAL EXAMINATION:  VITAL SIGNS: Blood pressure 138/68, pulse 72,  respirations 20, temperature 98.8.  GENERAL: The patient is an alert and oriented black male lying in bed,  appearing in moderate discomfort.  HEENT: Within normal limits. He has minimal scleral icterus.  NECK: Supple.  CHEST: Clear to auscultation.  HEART: Regular rate and rhythm without murmur.  ABDOMEN: Soft and flat.  EXTREMITIES: Normal except for pain to range of motion and palpation of his  upper extremity, left thigh. He was ambulatory without significant  difficulty.  GENITALIA: Grossly normal.   LABORATORY DATA:  CBC initially revealed white count of 14.5 with platelet  count of 301,000. Chemistries reveal no significant changes and urinalysis  was normal. The patient will have  repeat of his CBC on the day of discharge,  today.   Chest x-ray, PA and lateral, revealed no abnormality.   HOSPITAL COURSE:  The patient was admitted to the hospital and given IV  fluids of D-5 one-half normal saline with 20 mEq Kay Ciel per liter at 125  cc an hour. He was given Dilaudid 2 mg and Phenergan 12.5 mg IV q.2-3h.  p.r.n. pain. He was given Benadryl for itching and nasal cannula O2. He  gradually improved and was able to be transitioned to oral medication. He  was given two doses of K-Dur 20 mEq p.o. as a previous lab value of  potassium 3.3 was thought to be this admission.  Repeat of his potassium was  4.3. He was discharged and follow outpatient management __________ .      WWM/MEDQ  D:  03/17/2004  T:  03/17/2004  Job:  [id number]

## 2010-09-12 NOTE — H&P (Signed)
[place]  Patient:    [name], [name] Visit Number: [id number] MRN: [id number]          Service Type: OBV Location: [id number] Attending Physician:  Cala Bradford Dictated by:   Stacie Acres Cliffton Asters, M.D. Admit Date:  06/26/2001                           History and Physical  CHIEF COMPLAINT:  Pain, bilateral lower extremities x1 week.  HISTORY OF PRESENT ILLNESS:  [age]-year-old male with a history of sickle cell anemia presents with severe bilateral lower extremity pain from the knees down, left greater than right, x1 week.  No chest pain, shortness of breath or fever.  Similar to previous episodes.  Has been taking increased fluids, folic acid and OxyContin 40 mg q.4h. without relief.  No precipitating factor that he is aware of, specifically no dehydration, no URI symptoms.  Generally has two or three crises per year.  PAST MEDICAL HISTORY:  Sickle cell anemia; status post bone marrow transplant; status post community-acquired pneumonia, August 2002.  CURRENT MEDICATIONS: 1. OxyContin 40 mg q.4h. 2. Folic acid 1 mg q.d.  ALLERGIES:  No known drug allergies.  SOCIAL HISTORY:  Patient lives with his dad, smokes a half pack per day, denies alcohol or drug use.  He is unable to work due to pain crises, is applying for disability.  FAMILY HISTORY:  His family history is unknown as he is adopted.  REVIEW OF SYSTEMS:  As per HPI.  All other systems are negative.  PHYSICAL EXAMINATION:  GENERAL:  WD, WN BM is ambulatory but with obvious pain his legs.  VITAL SIGNS:  T 99.3, BP 121/69, P 89, R 20.  Pulse oximetry 95% on room air.  HEENT:  Tympanic membranes clear bilaterally.  Pupils equal, round and reactive to light.  Oropharynx clear.  NECK:  Supple.  No lymphadenopathy.  CARDIAC:  Regular rate and rhythm, without murmur, S3 or S4.  LUNGS:  Clear to auscultation.  ABDOMEN:  Soft, nondistended and nontender.  Positive  bowel sounds.  GU:  Groin:  Normal male genitalia.  RECTAL:  Deferred.  NEUROLOGIC:  Cranial nerves II-XII are intact.  Strength 5/5, bilateral upper and lower extremities.  Reflexes 1+ and symmetric.  EXTREMITIES:  No edema.  Dorsalis pedis 2+ bilaterally.  MUSCULOSKELETAL:  Full range of motion in all joints without pain.  There is pain to palpation in bilateral calves with no erythema or swelling.  LABORATORY AND ACCESSORY DATA:  Labs reveal hemoglobin of 12.0, white count 11.2 with 57% neutrophils; reticulocyte count percent is 1.27.  ASSESSMENT:  [age]-year-old male with a history of sickle cell disease presents with acute sickle cell crisis similar to previous crises, no evidence of infection or acute chest syndrome.  PLAN:  Admit for 23-hour observation, sickle cell protocol, use morphine rather than Dilaudid as he developed itching with Dilaudid last time and culture if he becomes febrile. Dictated by:   Stacie Acres Cliffton Asters, M.D. Attending Physician:  Cala Bradford DD:  06/26/01 TD:  06/27/01 Job: [id number]

## 2010-09-12 NOTE — Discharge Summary (Signed)
[place]  Patient:    [name], [name] Visit Number: [id number] MRN: [id number]          Service Type: MED Location: [id number] Attending Physician:  Rosanne Sack Dictated by:   Viviana Simpler, M.D. Adm. Date:  12/12/2000 Disc. Date: 12/19/2000   CC:         Justine Null, M.D. [facility]   Discharge Summary  DATE OF BIRTH:  Sep 02, 1975.  CONSULTS:  None.  PROCEDURES:  None.  DISCHARGE DIAGNOSES: 1. Trilobar pneumonia with element of possible sickle cell chest syndrome. 2. Sickle cell pain crisis.  Discharge hemoglobin 9.3 (admission 12.4 in    hemoconcentrated state). 3. Anemia secondary to the above.  DISCHARGE MEDICATIONS: 1. Folic acid 1 mg p.o. q.d. 2. Tequin 400 mg p.o. q.d. to complete a 12-day antibiotic course. 3. Ibuprofen 200 mg three p.o. q.6h. p.r.n. pleuritic chest pain. 4. OxyContin 30 mg p.o. b.i.d. x 3 additional days, with oxycodone 5 mg    p.o. q.4-6h. p.r.n. 5. Combivent inhaler two puffs t.i.d. for one week.  DISCHARGE FOLLOW-UP: The patient is discharged on a Sunday.  He is advised to schedule an appointment within the next three days with Meredith Staggers, M.D., for a recheck.  HISTORY OF PRESENT ILLNESS:  The patient is a 35 year old male with a history of sickle cell disease, most recently hospitalized about one year ago.  He presented with back and chest pain typical of previous crises, self-treated with oxycodone at home with little improvement.  In the emergency department he was administered IV fluids and Dilaudid with some improvement, but was admitted by Dr. Arsenio Loader for further care.  HOSPITAL COURSE:  Morgan was found to be febrile at 100.1 with elevated white blood cell count of 15.4, and right lower lobe density on chest x-ray and a clinically dehydrated scenario.  Chest, back, and thigh pain was felt related to sickle cell crisis, and in addition to analgesics and IV  fluids, pain control was initiated.  Empiric antibiotics in the form of IV Tequin were provided and then follow-up with rehydration, chest x-ray was consistent with trilobar pneumonia involving the right middle and lower lobes and left lower lobe.  Hypoxia was mild, with oxygen saturation in the mid 80s on room air, treated with supplemental oxygen by nasal cannula.  Total bilirubin on admission was 2.8, rising to 3.4, and again resolving to 2.8 at the time of discharge, consistent with hemolysis.  Symptoms progressively resolved, and on the date of discharge, leukocytosis has completely resolved.  Oxygen supplementation is no longer required.  Fever has resolved, and pain is well-controlled.  Most problematic pain toward the end of hospitalization was pleuritic in nature, treated with ibuprofen with excellent results.  [place] will follow up early this week with primary medical Nolie Bignell.  He is advised to return to the hospital for further medical care should any worsening occur. Dictated by:   Viviana Simpler, M.D. Attending Physician:  Rosanne Sack DD:  12/19/00 TD:  12/20/00 Job: [id number]

## 2010-09-12 NOTE — Discharge Summary (Signed)
NAME:  [name], [name]                          ACCOUNT NO.:  [id number]   MEDICAL RECORD NO.:  [id number]                   PATIENT TYPE:  INP   LOCATION:  0358                                 FACILITY:  [facility]   PHYSICIAN:  Lorelle Formosa, M.D.           DATE OF BIRTH:  July 18, 1975   DATE OF ADMISSION:  11/25/2002  DATE OF DISCHARGE:  12/05/2002                                 DISCHARGE SUMMARY   ADMISSION DIAGNOSES:  Sickle cell crisis with pyrexia.   DISCHARGE DIAGNOSES:  [place] vasoocclusive crisis.   CONDITION ON DISCHARGE:  Stable.   DISCHARGE DISPOSITION:  Follow up in the office in approximately one to two  weeks.   DISCHARGE MEDICATIONS:  1. MS Contin 60 mg b.i.d.  2. MSIR 50 mg q.i.d. p.r.n. breakthrough pain.  3. Ibuprofen 800 mg t.i.d. p.r.n. mild to moderate pain.   HISTORY:  This patient is a 35 year old single black male who has known [place]  sickle cell disease.  He presented with pain, temperature 101, and was  evaluated by on-call physician who admitted him.  He did not have any cough,  congestion, diarrhea, urgency, or frequency or dysuria.   His past medical history, family history, personal history, social history,  review of systems are outlined in recent history and physical.   PHYSICAL EXAMINATION:  VITAL SIGNS:  Blood pressure 153/81, pulse 81,  respirations 22, temperature 97.3.  HEENT:  Mucous membranes were moist and patient had no active disease.  Sclerae were icteric.  Pupils were reactive.  NECK:  Supple.  LUNGS:  Clear.  HEART:  Regular rhythm and rate with no murmur.  ABDOMEN:  Soft and flat.  EXTREMITIES:  Normal.  NEUROLOGIC:  Grossly physiologic.   LABORATORIES:  CBC revealed white count 16,100, hematocrit 35.4, hemoglobin  12.5.  Repeat review of white count 13.2 with hemoglobin 8.9.  Red blood  cell morphology revealed target cells, teardrop cells, sickle cells,  nucleated red cells, reticulocyte count 2.2.  Chemistries were  unremarkable.  Urinalysis was normal.  He had a chest x-ray which showed scarring  throughout both lungs with no evidence of acute disease.  On August 4 chest  x-ray revealed acute right middle and lower lobe infiltrates with follow-up  recommended.    HOSPITAL COURSE:  The patient was admitted to hospital and treated with IV  fluids of D5 one-half normal saline and given ________ p.o. for fever and  Dilaudid 1-2 mg q.2-4h. p.r.n.  He was also given Tequin 400 mg IV daily.  The patient improved and was able to be transitioned to oral medicines.  He  was given also incentive spirometry.  He was discharged for outpatient  management.  Lorelle Formosa, M.D.    WWM/MEDQ  D:  12/21/2002  T:  12/21/2002  Job:  [id number]

## 2010-09-12 NOTE — Discharge Summary (Signed)
NAME:  [name], [name]                          ACCOUNT NO.:  [id number]   MEDICAL RECORD NO.:  [id number]                   PATIENT TYPE:  INP   LOCATION:  0440                                 FACILITY:  [facility]   PHYSICIAN:  Elliot Cousin, M.D.                 DATE OF BIRTH:  [birth year]   DATE OF ADMISSION:  11/10/2003  DATE OF DISCHARGE:  11/24/2003                                 DISCHARGE SUMMARY   DISCHARGE DIAGNOSES:  1. Bilateral community-acquired pneumonia with fibroproliferative changes.  2. Possible acute chest syndrome.  3. Hypoxic respiratory failure secondary to pneumonia.  4. Sickle cell crisis.  5. Leukocytosis secondary to pneumonia and steroids.  6. Hyperglycemia secondary to steroids.  7. Left lower extremity ankle fracture.  8. 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  9. Tobacco abuse.   DISCHARGE MEDICATIONS:  1. Avelox 400 mg daily x 5 more days.  2. Dilaudid 2 mg q.6h. p.r.n. pain.  3. Prednisone dose taper 5 mg 6 day course, take as directed.   DISCHARGE DISPOSITION:  The anticipated date of discharge is on November 24, 2003.  The patient is currently in much improved and stable condition.  The  patient will follow up with Dr. Parke Simmers on Monday, August 8, at 1:45 p.m.  He  will follow up with orthopedic surgeon, Dr. Darrelyn Hillock as scheduled.   CONSULTATIONS:  Dr. Sandrea Hughs.   HISTORY OF PRESENT ILLNESS:  The patient is a 35 year old man with a past  medical history significant for sickle cell disease, who presented to the  emergency department on November 11, 2003, with a 48 hour history of left-sided  chest pain and back pain.  The pain was worse with movement.  The patient  had no relieving factors with the exception of staying still.  The patient  had no complaints of fever or shortness of breath.  When the patient was  evaluated in the emergency department, his temperature was found to be 99.4,  heart rate 88, respiratory rate 18, and blood pressure  108/71.  His chest x-  ray revealed bibasilar changes suggestive of chronic scarring.  The  patient's bilirubin was elevated at 3.8.  His reticulocyte count was normal  at 2.2%.  His hemoglobin was normal at 12.7 with an MCV of 95.4.  Given the  patient's history of sickle cell anemia, he was admitted for presumed sickle  cell crisis.   HOSPITAL COURSE:  #1 - BILATERAL COMMUNITY-ACQUIRED PNEUMONIA WITH  FIBROPROLIFERATIVE CHANGES AND HYPOXIC RESPIRATORY FAILURE:  The patient was  initially admitted to the hematology floor.  He was treated with aggressive  volume repletion with D5 normal saline with 20 mEq of potassium chloride  added at 150 mL/hr.  The patient's pain was treated with 4 mg of Dilaudid IV  q.3h. p.r.n.  Elavil at 25 mg q.h.s. was added for sleep and also as a pain  modulator.  The patient also actually received a bolus of normal saline at  500 mL x 1 while in the ED.  The patient was treated as needed with  Phenergan 25 mg IV q.4h.  Cardiac enzymes were ordered every 8 hours x 3 to  rule out myocardial infarction.  The cardiac enzymes were negative x 3 sets.  As stated above, the patient's chest x-ray revealed bibasilar opacities  thought to be secondary to chronic scarring.  His white blood cell count on  admission was 19.5 with an absolute neutrophil count of 14.9.  The following  morning, the patient became febrile with a temperature of 101.3.  Blood  cultures were ordered.  His white blood cell count increased to 20.9.  A  repeat chest x-ray revealed bibasilar opacities.  The patient was therefore  started on Avelox 400 mg IV daily.  On hospital day #3, it was noted that  the patient's oxygen saturations were beginning to fall.  He was placed on 2  L of nasal cannula oxygen to be titrated upward.  However, his oxygen  saturations actually fell to 76% on 2 L of nasal cannula oxygen.  He was  subsequently placed on 100% nonrebreather.  His oxygen saturations increased  to  86%.  On hospital day #3, his temperature increased to 103.2 as well.  Given these findings, the antibiotic regimen was expanded to Avelox,  cefepime, and vancomycin.  The patient was transferred to the ICU.  Pulmonologist/critical care physician, Dr. Sherene Sires was consulted.   Dr. Sherene Sires agreed with the medical management.  He thought that the patient  had probable community-acquired pneumonia in the setting of sickle cell  disease.  He recommended adding DVT prophylaxis and GI prophylaxis with  Lovenox and with Protonix, respectively.  A respiratory specimen was  collected.  The Gram stain revealed rare WBCs, few gram-positive cocci, and  rare gram-positive rods.  The culture was pending on hospital day #3.  It  was also noted that the patient's white blood cell count actually increased  to 34.9.  The patient was continued on aggressive treatment with oxygen  therapy and antibiotic therapy.  Atrovent and Xopenex nebulizers were also  added.  The patient had quite a bit of left-sided pleuritic chest pain and  therefore the Dilaudid had to be increased to 8 mg IV every 3 hours.  The  patient was also treated with MS Contin 15 mg q.12h. by mouth.  After  several days, the patient did not appear to improve symptomatically.  The  patient actually had to be placed on BiPAP for at least 48 hours to improve  oxygenation and ventilation.  Evaluating the patient further with a CT scan  of the chest was entertained; however, it was felt that a CT scan was not  needed at that particular time.  After the BiPAP therapy, the patient's  oxygenation improved significantly.  His ABG on 100% nonrebreather and BiPAP  revealed a pH of 7.367, pCO2 of 45.3, and a pO2 of 66.7.  Gradually, the  BiPAP was weaned off, and the supplemental oxygen was titrated down.  The  patient began to require 5-6 Lpm of nasal cannula oxygen to keep his oxygen saturations above 93%.  The respiratory culture grew out normal   oropharyngeal flora.  The vancomycin was therefore discontinued.  The  patient was also started on Solu-Medrol 80 mg IV every 12 hours by Dr. Sherene Sires  for presumed fibroproliferative changes.  Approximately 24 hours after  the  treatment with Solu-Medrol, the patient's symptoms began to improve.   The patient's fever curve began to improve on hospital day #6.  The  patient's fever curve began to improve on hospital day #6.  The cefepime was  eventually discontinued.  The patient was maintained on Avelox.  The patient  was eventually transferred out of the ICU to the floor.  The Avelox was  changed from IV to p.o.  The oxygen saturations improved to 97-98% on 3-4 L  of nasal cannula oxygen.  His fever curve improved tremendously.  The  patient has now been afebrile for the past 4 days.  He is currently  oxygenating 94-95% on room air.  The pleuritic chest pain has almost  completely resolved.   The Solu-Medrol was tapered off, and the patient was subsequently started on  a prednisone dose taper.  He is currently on Avelox 400 mg daily and will  continue on Avelox for an additional 5 more days after hospital discharge.  The patient's prednisone will be transitioned over to a prednisone dose-pack  5 mg 6 day course.  The patient is currently in no acute distress.  He is  much improved clinically and symptomatically.  He will probably be  discharged home tomorrow with follow up with Dr. Parke Simmers in one week.  The  patient will also be sent home on as needed Dilaudid at 2 mg q.6h. p.r.n.  for pain.  The cause of the patient's hypoxic respiratory failure was felt  to be secondary to bilateral community-acquired pneumonia with  fibroproliferative changes and possibly acute chest syndrome in the setting  of sickle cell disease.   #2 - SICKLE CELL DISEASE WITH SICKLE CELL CRISIS:  The patient had  significant left-sided pleuritic chest pain and low back pain.  The patient  in part was secondary to  sickle cell crisis and possibly acute chest  syndrome.  The patient's reticulocyte count on admission was 2.2% with the  absolute reticulocyte count at 85.5.  His hemoglobin was 12.7, hematocrit  35.9, and MCV was 95.4 on hospital admission.  The patient's reticulocyte  count was reevaluated several days later.  The percentage had increased to  3.2.  His hemoglobin also actually fell to 8.5.  The patient was typed and  crossed for 2 units of packed red blood cells by Dr. Sherene Sires.  He was  subsequently transfused 2 units.  His hemoglobin increased to 10.3 following  transfusion.  The patient's repeated reticulocyte count several days later  revealed a decrease to 2.6% with the absolute reticulocyte count decreasing  to 67.1.  The patient's LDH was elevated at 271.  A hemoglobin panel was  ordered per Dr. Sherene Sires.  It revealed hemoglobin F elevated at 3.8, hemoglobin S elevated at 43.3, and hemoglobin C elevated at 46.6.  His hemoglobin as of  November 22, 2003, was 10.4, and the hematocrit was 30.8.   #3 - HYPERGLYCEMIA SECONDARY TO PREDNISONE/SOLU-MEDROL:  The patient's  capillary blood glucose increased during the hospital course.  His capillary  blood sugars were assessed before each meal and at bedtime.  After the Solu-  Medrol was started, the patient's capillary blood sugars ranged from 100 up  to 250.  He was treated with a sliding-scale insulin regimen with regular  insulin and q.h.s. dosing of Lantus.  The Lantus was titrated up to keep his  capillary blood sugars below 150.  Once the Solu-Medrol was titrated off,  the patient's capillary blood sugars improved  to a range of 120-140.  The  patient is currently on prednisone at 30 mg b.i.d.  He is still currently on  a sliding-scale insulin regimen and Lantus.  Once the prednisone is tapered  off, it is anticipated that the patient will not have any insulin  requirements.  The patient will therefore be discharged home on no insulin  and no  oral hypoglycemic agents.   #4 - LEFT ANKLE FRACTURE:  The patient fractured his ankle several weeks  ago.  He was followed by orthopedic surgeon, Dr. Darrelyn Hillock.  He was admitted  with a hard cast on his left lower extremity.  As of yesterday, the ortho  tech from Dr. Jeannetta Ellis office removed the cast.  The patient has evidence  of left lower extremity atrophy.  He is currently ambulating with crutches.  The patient will follow up with Dr. Darrelyn Hillock as scheduled.                                               Elliot Cousin, M.D.    DF/MEDQ  D:  11/23/2003  T:  11/23/2003  Job:  [id number]   cc:   Renaye Rakers, M.D.  [phone]  [place]  [place] [address]  Fax: [phone]   Lorelle Formosa, M.D.  [phone] [address]  [place]  [place] [address]  Fax: [phone]

## 2010-09-12 NOTE — Discharge Summary (Signed)
[name], [name]                ACCOUNT NO.:  [id number]   MEDICAL RECORD NO.:  [id number]          PATIENT TYPE:  INP   LOCATION:  1526                         FACILITY:  [facility]   PHYSICIAN:  Fleet Contras, M.D.    DATE OF BIRTH:  08/12/1975   DATE OF ADMISSION:  10/30/2006  DATE OF DISCHARGE:  11/02/2006                               DISCHARGE SUMMARY   HISTORY OF PRESENT ILLNESS:  Summary of admission of this 35 year old  African-American gentleman with past medical history significant for  hemoglobin SS sickle cell disease admitted via the emergency room at  [facility] with complaints of pain in the lower back and chest  for several days not relieved by his usual pain medications at home. He  was in mild to moderate painful distress in the emergency room. His  vital signs were stable.  He had no history of trauma or injury.  He had  no fevers or chills. He received intravenous analgesia in the emergency  room with pain not sufficiently relieved for discharge home.  He was,  therefore, admitted for close monitoring and pain management.   HOSPITAL COURSE:  On admission, his vital signs were stable.  Physical  exam essentially was negative.  The laboratory data showed hematocrit of  34.5 total bilirubin was 2.5.  He received intravenous fluids as well as  intravenous analgesia.  He did have some bibasilar rales in his lungs.  A chest x-ray was performed.  This was negative for pneumonia.  However,  he was started on Zithromax and Rocephin on admission, but on November 02, 2006, the patient decided to leave the hospital against medical advice,  and he signed the form and left the hospital without completing his  treatment. The risks of this decision were made not to him prior to his  discharge.      Fleet Contras, M.D.  Electronically Signed     EA/MEDQ  D:  12/18/2006  T:  12/19/2006  Job:  [id number]

## 2010-09-12 NOTE — Discharge Summary (Signed)
NAMEADD, DINAPOLI                ACCOUNT NO.:  [id number]   MEDICAL RECORD NO.:  [id number]          PATIENT TYPE:  INP   LOCATION:  1322                         FACILITY:  [facility]   PHYSICIAN:  Fleet Contras, M.D.    DATE OF BIRTH:  [birth year]   DATE OF ADMISSION:  11/13/2005  DATE OF DISCHARGE:  11/14/2005                                 DISCHARGE SUMMARY   ADMITTING DIAGNOSES:  1. Sickle cell pain crisis.  2. Avascular necrosis of the hips.   HISTORY OF PRESENT ILLNESS:  Mr. 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  He came to the  emergency room with progressively worsening pain in the left hip involving  the left side.  He had no history of trauma or falls and no fevers or  chills.  He had used his home medications without adequate relief.  In the  emergency room he rated his pain as a 10 out of a maximum of 10.  He was  therefore admitted to the hospital for pain management and further  evaluation.   HOSPITAL COURSE:  On admission his vital signs were stable.  O2 saturations  on room air were 96%.  He was tender over the trochanter on the left hip  region.  There was no obvious deformity or swelling.  His initial laboratory  data showed a hemoglobin of 12.8, hematocrit of 38.  His white count was  10.7, platelet count 290,000, and retic count was 3.2%.  His chemistry study  was normal.  His urine drug screen was positive for cannabinoids and  opiates.  His chest x-ray was negative for any acute infiltrates.  He was  started on intravenous fluids half normal saline at 75 mL an hour, IV  morphine 5 mg q.4 h. was given for pain.  He improved very quickly and  within 24 hours his pain was well controlled, and he felt he  was ready to  go home.   DISCHARGE DIAGNOSES:  1. Avascular necrosis of the hips.  2. Sickle cell disease.  3. Drug  abuse.   DISCHARGE MEDICATIONS:  1. MS Contin 60 mg q.12 h.  2. MS-IR 50 mg q.6 h. p.r.n.  3. Ibuprofen 800 t.i.d. p.r.n.   Has a follow-up with me in two to four weeks and he was arrange to follow up  with Dr. Charlann Boxer, the orthopedist, based on his appointments.   DISCHARGE CONDITION:  Stable.   DISPOSITION:  For home.      Fleet Contras, M.D.  Electronically Signed     EA/MEDQ  D:  11/30/2005  T:  12/01/2005  Job:  [id number]

## 2010-09-12 NOTE — Discharge Summary (Signed)
NAME:  [name], [name]                          ACCOUNT NO.:  [id number]   MEDICAL RECORD NO.:  [id number]                   PATIENT TYPE:  INP   LOCATION:  0257                                 FACILITY:  [facility]   PHYSICIAN:  Lorelle Formosa, M.D.           DATE OF BIRTH:  03-Aug-1975   DATE OF ADMISSION:  05/12/2002  DATE OF DISCHARGE:  05/14/2002                                 DISCHARGE SUMMARY   ADMISSION DIAGNOSES:  1. History of sickle cell disease with vaso-occlusive crisis.  2. 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   DISCHARGE DIAGNOSES:  1. History of sickle cell disease with vaso-occlusive crisis.  2. 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   HISTORY OF PRESENT ILLNESS:  This patient is a transitional white, single  male who presented to the ER for evaluation of severe pain in the hips and  pelvis.  He has known diagnosis of sickle cell disease and apparently is  currently disabled and receiving SSI.  His medicines include methadone 10 mg  per day.  He usually takes one pill daily as needed.  He also may take folic  acid at times.  He was seen in the emergency room and was evaluated by  emergency department physician where he had a temperature of 102.1 degrees  orally.  Vital signs were otherwise, stable except for tachycardia of 114,  pulse oxygen was 97% on room air.  CBC revealed hemoglobin 12.4 with  hematocrit of 35.1, white count 23,000, and platelets 256,000.  Total  bilirubin 3.3.  CMET was remarkable only for potassium of 3.3.  Chest x-ray  had no active disease.  The patient was given IV fluids and Tylenol 1000 mg  with morphine sulfate 4 mg IV and Phenergan 12.5 mg IV.  In absence of on  cal MD, I was called to further treat him if he did not improve enough to go  home.   His past medical history, family history, personal history, and review of  systems are as outlined in the admission history and physical.   LABORATORY DATA:  Laboratories are as outlined in the admission history and  physical except for blood cultures and urine cultures -  He had no growth.   HOSPITAL COURSE:  The patient was admitted to the sickle cell unit and given  IV fluids and IV Dilaudid as indicated.  He was given Theo-Dur p.o.  He  improved and basically was voicing that he needed to go home and did not  wait for on-call physician to see him on May 14, 2002.  The patient  apparently was seen at admission by me and then by the on-call physician for  the next two days. On the 18th I had a telephone call received from the  police department stating that they received a call from him stating that we  basically were trying  to poison him and that he wanted to leave [facility] right away, and apparently the officer was told that he would allow  the nurse to take care of the situation when and this was apparently when  the patient blockaded the door and would not allow the nurse in the room.  He was asked repeatedly to open the door so that we could have  communication, and he refused.  The nurse stated too that he would not open  the door either.  The patient's father was in the room and the patient asked  his father to open the door.  The patient was very upset stating to his  father that it took too long to give him on his medicines.  He was unsure of  what I gave him.  The patient had received Dilaudid and Tranxene at 1815  hours, the nurse also gave him 2 L normal saline. It was his major complaint  about the nurse. As mentioned, in giving him his medicines, he was being  very impolite so she gave him medicines slowly over five minutes. The  patient continued to be irate and the patient's father then closed the door  on the nurse, per her report.  She subsequently notified the hospital  administration of her situation, __________for - _______ sleep with the  patient and continued with complaints that the  nursing staff forcing him and  that the staff was congregating in the hall and then the door was opened up  again.  I was notified of the situation and instructed the staff to call the  doctor on call, Dr. Pecola Leisure was notified and she spoke with the patient's  father.  The patient said basically that he wanted to have a good friend  talk, so I spoke with him but after speaking with Dr. Pecola Leisure he agreed to  stay if another nurse cared for him. Apparently Ceasar Mons, his care  Nyriah Coote, and the nurse signed off and assigned him to a Bretta Bang who  would continue his care.  However, per nursing note of the same evening at  21:00, the patient said he wanted to be discharged and he apparently did  leave with his girlfriend, Candace.  The patient refused the AMA form and  left the hospital against medical advice and told the nurse that he did not  any further questions. Just stated he felt well enough to go home. He phoned  his father and told him that he would be there and that Candace, his  girlfriend, would bring him home.  The administration coordinator in [facility] was  notified and the patient left accompanied by his girlfriend.  Apparently,  the patient had an appropriate conversation and said that he felt he could  go home and would be more comfortable at home.  He indicated that his crisis  could be handled at home; thus, he left AMA in the company of his  girlfriend.                                               Lorelle Formosa, M.D.    WWM/MEDQ  D:  06/16/2002  T:  06/16/2002  Job:  [id number]

## 2010-09-12 NOTE — Discharge Summary (Signed)
[facility]  Patient:    [name], [name] Visit Number: [id number] MRN: [id number]          Service Type: MED Location: 2S 0256 01 Attending Physician:  Anastasio Auerbach Dictated by:   Anastasio Auerbach, M.D. Admit Date:  09/06/2001 Discharge Date: 09/09/2001   CC:         Meredith Staggers, M.D.   Discharge Summary  DATE OF BIRTH:  April 18, 1976  DISCHARGE DIAGNOSES: 1. Acute sickle cell crisis.    a. Painful lower extremities. 2. Normocytic anemia secondary to #1.    a. Discharge hemoglobin 11.0.  Reticulocyte percentage 4.4.    b. Total bilirubin 3.3 (representing chronic sickle cell/hemolysis). 3. Leukocytosis, improved.    a. Suspect secondary to stress.    b. No evidence of infection. 4. Chronic tobacco use.    a. A half pack per day. 5. Status post bone graft, lower extremity. 6. Transient hypoxia.    a. Secondary to atelectasis. 7. Dehydration, resolved. 8. Marijuana use.    a. Urine drug screen positive.  DISCHARGE MEDICATIONS: 1. Folic acid 1 mg 2 q.d. 2. Percocet 5/500 one to two q.6h. p.r.n. severe pain (dispensed #20, no    refills).  CONDITION UPON DISCHARGE:  Stable.  Oxygen saturations 100% on room air.  DISPOSITION:  Home.  RECOMMENDED ACTIVITY:  As tolerated.  RECOMMENDED DIET:  As tolerated.  Drink plenty of water.  SPECIAL INSTRUCTIONS:  NO SMOKING.  FOLLOWUP:  The patient is to contact Dr. Dayton Scrape to schedule followup in 1-2 weeks.  At that visit, he should have a CBC checked.  HOSPITAL COURSE: #1 - ACUTE SICKLE CELL CRISIS:  The patient is a 35 year old African American gentleman with sickle cell anemia.  He presents with painful lower extremities.  Pathology review of blood smear reveals a few sickle cells and a pattern suspicious for [place] disease.  The patient is admitted and given IV fluids and pain medicines.  He had some transient hypoxia, likely secondary to atelectasis and sedation from the pain medication.   Over time, this improved, as did his pain.  He is discharged home with 20 Percocet.  He will follow up with his primary care physician or return to the emergency department if there are significant problems.  #2 - NORMOCYTIC ANEMIA:  Again, I think he probably does have [place] disease. Discharge hemoglobin was 11.0.  Reticulocyte count 4.4.  His liver function tests show an elevated total bilirubin at 3.3.  We did not fractionate this but my guess is that it is related to chronic red cell breakdown because of his [place] disease.  OTHER PERTINENT DISCHARGE LABORATORY DATA:  WBC 10,600, platelet count 233. Sodium 141, potassium 3.8, chloride 106, bicarb 29, BUN 5, creatinine 0.9, glucose 82, calcium 9.3. Dictated by:   Anastasio Auerbach, M.D. Attending Physician:  Anastasio Auerbach DD:  09/26/01 TD:  09/27/01 Job: [id number]

## 2010-09-12 NOTE — Discharge Summary (Signed)
NAME:  [name], [name]                          ACCOUNT NO.:  [id number]   MEDICAL RECORD NO.:  [id number]                   PATIENT TYPE:  INP   LOCATION:  0277                                 FACILITY:  [facility]   PHYSICIAN:  Lorelle Formosa, M.D.           DATE OF BIRTH:  July 04, 1975   DATE OF ADMISSION:  09/30/2002  DATE OF DISCHARGE:  10/10/2002                                 DISCHARGE SUMMARY   ADMISSION DIAGNOSIS:  Sickle cell vasoocclusive crisis.   DISCHARGE DIAGNOSES:  1. Sickle cell vasoocclusive crisis.  2. Mild right lower lobe pneumonia.   DISCHARGE CONDITION:  Stable.   DISPOSITION:  Discharged in good condition, to be followed in the office in  two weeks.   DISCHARGE MEDICINE:  1. Zithromax 500 mg daily.  2. Continue home medicines for pain.   HISTORY:  This patient is a 35 year old male, who has SS sickle cell  disease, who presented with right-sided chest pain, diaphoresis, and  shortness of breath.  He was admitted by Dr. Renaye Rakers.  Past medical  history, family history, personal history, review of systems as on admission  history and physical.   PHYSICAL EXAMINATION:  VITAL SIGNS:  Blood pressure 121/82, pulse 75,  respirations 20, pulse oxygen on room air 95%.  GENERAL:  The patient is a healthy black male, sitting, in no acute  distress.  HEENT:  Anicteric sclerae.  NECK:  Supple with no adenopathy, venous distention, or bruit.  CHEST:  Clear to auscultation.  HEART:  No murmur.  ABDOMEN:  Soft, flat, with no masses.  EXTREMITIES:  Unremarkable.  NEUROLOGICAL:  Grossly physiologic.   LABORATORY DATA:  CBC:  White blood count 15.6, hemoglobin 13.2, platelets  278,000.  Repeat white count was 5.7 with hemoglobin 10.9, hematocrit 31.4,  platelets 432,000.  Red blood cell morphology revealed polychromasia, target  cells, basophilic stippling, sickle cells, and a hemoglobin crystals.  Reticulocyte count was 2.4.  Chemistries were basically normal.   Cardiac  enzymes were unremarkable.  Respiratory cultures were normal.   EKG had marked sinus bradycardia.   Chest x-ray revealed bilateral basilar atelectasis with blunting of the  right costovertebral angle with a possibility of early peribronchial  infiltrate at the base.  Heart was mildly enlarged.  Repeat chest x-ray  revealed worsening bibasilar infiltrate or atelectasis, right greater than  left probably with right effusion.  Repeat on June 12, revealed slight  improvement and on June 15, revealed improved bibasilar patchy air space  disease and improved right effusion.   HOSPITAL COURSE:  The patient was treated with IV fluids of normal saline  and morphine sulfate 2-4 mg q.4 h.  He was given Zithromax 500 mg q.24 h.  p.o. and symptomatic medicines.  The patient was admitted by Dr. Leilani Able.  He was also given albuterol 2.5 mg nebulizer q.4 h. and __________.  Chest x-ray and O2 saturations were  monitored  which revealed gradual  improvement.  He was thus able to be transitioned to oral medicines and  discharged, comfortable, for outpatient management.                                               Lorelle Formosa, M.D.    WWM/MEDQ  D:  10/19/2002  T:  10/19/2002  Job:  [id number]

## 2010-09-12 NOTE — Discharge Summary (Signed)
[name], [name]                ACCOUNT NO.:  [id number]   MEDICAL RECORD NO.:  [id number]          PATIENT TYPE:  INP   LOCATION:  1321                         FACILITY:  [facility]   PHYSICIAN:  Fleet Contras, M.D.    DATE OF BIRTH:  Sep 15, 1975   DATE OF ADMISSION:  06/24/2005  DATE OF DISCHARGE:  06/27/2005                                 DISCHARGE SUMMARY   HISTORY OF PRESENT ILLNESS:  [name] is a 35 year old African-American  gentleman with a past medical history significant for sickle cell disease  and history of avascular necrosis of the right hip, status post right  hemiarthroplasty in October, 2006, who presented to the emergency room at  [facility] with a few days of progressively worsening pain of the  left hip.  He had no history of trauma or falls.  His pain was not relieved  by his usual analgesic of MS Contin and MSIR.  In the emergency room, he  rated his pain as a 10/10 just prior to intravenous therapy in the emergency  room, his pain was still at his level of 7-8/10.  He had no fevers or  chills.  He had no pain in other joints.  Due to the persistency of his pain  and poor ambulation, he was therefore admitted to the hospital for further  evaluation and appropriate therapy.   HOSPITAL COURSE:  On admission, the patient was started on intravenous  fluids with half normal saline at 5 cc/hr.  He also received intravenous  Dilaudid for pain 2-4 mg q.2-4h. accompanied by Phenergan 12.5 mg q.4h.  His  home medications of MS Contin 30 mg b.i.d. was also continued.  He was also  started on oral ibuprofen 800 mg t.i.d. p.r.n. pain.  His symptoms improved  slowly during the course of admission.  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  His symptoms improved with  pain control in the hospital.  He is therefore going to be followed up in  the office by his orthopedist, Dr. Durene Romans, at [facility], for further evaluation and probable left hip replacement in  the near future.   PHYSICAL EXAMINATION:  GENERAL:  Today, he is feeling much better.  His pain  is well controlled.  He is ambulatory in the hallway.  He is not in acute  respiratory or painful distress.  VITAL SIGNS:  He is afebrile.  He is well-hydrated.  His vital signs are  stable.  CHEST:  His chest shows good air entry bilaterally with no rales, rhonchi,  or wheezes.  ABDOMEN:  Abdomen is soft and nontender.  No masses.  Bowel sounds are  present.  EXTREMITIES:  Extremities show no edema, calf tenderness, or swelling.  His  left hip is not deformed.  There  is no tenderness.  No pain on full range of  motion.   His latest laboratory data on June 25, 2005 shows a sodium of 140, potassium  3.8, chloride 107, bicarbonate of 27, glucose 108, BUN 8, creatinine 0.8,  calcium 8.8.  His white cell count was 16.2, hemoglobin 11.4, hematocrit  32.7, platelet count 168.  His urinalysis was essentially negative.   ASSESSMENT:  [name] is a 35 year old African-American gentleman with a  past medical history significant for sickle cell disease, presenting with  left hip pain secondary to avascular necrosis of the femoral head.  He is  considered stable for discharge from today.   DISCHARGE DIAGNOSES:  1.  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  2.  Sickle cell disease.   DISCHARGE MEDICATIONS:  1.  MS Contin 30 mg b.i.d.  2.  MSIR 10 mg 1 q.6h. p.r.n.  3.  Ibuprofen 800 1 p.o. t.i.d. p.r.n.  4.  Folic acid 1 mg daily.   He is to follow up with me in 1-2 weeks and with Dr. Durene Romans in 1-2  weeks.   Disposition on discharge is stable.  This patient is going home.      Fleet Contras,  M.D.  Electronically Signed     EA/MEDQ  D:  06/27/2005  T:  06/28/2005  Job:  [id number]   cc:   Madlyn Frankel Charlann Boxer, M.D.  Fax: [phone]

## 2010-09-12 NOTE — Discharge Summary (Signed)
[name], [name]                ACCOUNT NO.:  [id number]   MEDICAL RECORD NO.:  [id number]          PATIENT TYPE:  INP   LOCATION:  1509                         FACILITY:  [facility]   PHYSICIAN:  Madlyn Frankel. Charlann Boxer, M.D.  DATE OF BIRTH:  05/21/1975   DATE OF ADMISSION:  02/20/2005  DATE OF DISCHARGE:  02/26/2005                                 DISCHARGE SUMMARY   ADMISSION DIAGNOSES:  1.  Failed previous hip surgery for a diagnosis of avascular necrosis in the      setting of sickle cell anemia.  2.  Sickle cell anemia without recent sickle cell crisis.   DISCHARGE DIAGNOSES:  1.  Failed previous hip surgery for a diagnosis of avascular necrosis in the      setting of sickle cell anemia.  2.  Sickle cell anemia without recent sickle cell crisis.  3.  Postoperative anemia treated with transfusion.   OPERATION:  On February 20, 2005, the patient underwent conversion of  previous right hip surgery to right total hip replacement arthroplasty  utilizing a DePuy hip system. D.L. [name], P.A.-C. assisted.   BRIEF HISTORY:  This is a 35 year old male with right hip pain who was seen  in our office where x-rays revealed a failed free vascular fibular grafting  for avascular necrosis of the femoral head of the right hip. The patient had  to take a considerable amount of pain medication secondary to his pain in  the hip and is having more and more difficulty getting about. After much  discussion including the risks and benefits of surgery and the availability  of metal on metal prosthesis, it was decided to go ahead with total hip  replacement arthroplasty and he was scheduled for same.   HOSPITAL COURSE:  The patient tolerated the surgical procedure quite well  and was placed on Coumadin protocol postoperatively for prevention of DVT.  He began slowly with his increase in activity. It was noted that he had a  drop in his hemoglobin which was not that surprising in the face of sickle  cell,  however, we did treat him with 2 units of blood. This brought his  hemoglobin up somewhat but it dropped again to 7.3 and he was transfused a  second time. He really had no trouble with his postoperative rehabilitation  and was able to maintain touchdown weightbearing as indicated. The wound  remained dry, he remained awake and alert and we were able to wean him from  the stronger analgesics. Home health was arranged for the patient as well as  home durable medical goods. Once this was in place, it was felt he could be  maintained in his home environment and arrangements were made for discharge.  Laboratory values in the hospital hematologically shows a CBC with  differential. Preoperatively there was an elevated white count of 11.4,  hemoglobin was 13.2, hematocrit was 38.6. RBCs were 3.88. Final hemoglobin  was 8.2, his hematocrit of 23.0 and this is stable over a 3 day run. Blood  chemistries were essentially normal. RBC morph showed target cell and  elliptocytes.  Blood chemistries were essentially normal, urinalysis negative  for urinary tract infection. Electrocardiogram showed normal sinus rhythm,  chest x-ray showed bilateral basilar atelectasis with possible associated  infiltrates, left more prominent than the right.   CONDITION ON DISCHARGE:  Improved and stable.   PLAN:  The patient is discharged to his home in the care of his family. He  is to continue his Coumadin protocol. We will keep him on iron p.o. x3  weeks. He is given OxyContin 40 mg 1 p.o. q.12 h for pain, Robaxin as a  muscle  relaxant and oxycodone for breakthrough. Will use Valium 5 mg as a muscle  relaxant. Continue with partial weight bearing to the right lower extremity  for at least 3 weeks. Should he have any medical problem we recommend he  followup with his family physician.      [name].      Madlyn Frankel Charlann Boxer, M.D.  Electronically Signed    DLU/MEDQ  D:  03/04/2005  T:   03/05/2005  Job:  [id number]

## 2010-09-12 NOTE — Discharge Summary (Signed)
NAME:  [name], [name]                          ACCOUNT NO.:  [id number]   MEDICAL RECORD NO.:  [id number]                   PATIENT TYPE:  INP   LOCATION:  0257                                 FACILITY:  [facility]   PHYSICIAN:  Lorelle Formosa, M.D.           DATE OF BIRTH:  1975/05/29   DATE OF ADMISSION:  08/13/2003  DATE OF DISCHARGE:  08/22/2003                                 DISCHARGE SUMMARY   ADMISSION DIAGNOSIS:  Sickle cell vaso-occlusive crisis.   DISCHARGE DIAGNOSIS:  Sickle cell vaso-occlusive crisis.   HISTORY:  Patient is a 35 year old single black male who presented to the  emergency room with a complaint of hurting.  This pain is mostly in the  upper.  He had onset of about two days prior and had run out of home  medicines.  These included MS Contin 60 mg b.i.d., MSIR 50 mg q.i.d. p.r.n.  breakthrough pain, and folic acid 1 mg daily.  The patient has not  experienced any cold symptoms of significance.  Plus he was given IV fluids,  Dilaudid in the ER without obtaining significant relief, and he was thus  referred for further inpatient care.   PAST MEDICAL HISTORY:  Outlined in the admission history and physical.   FAMILY HISTORY:  Outlined in the admission history and physical.   PERSONAL HISTORY:  Outlined in the admission history and physical.   REVIEW OF SYSTEMS:  Outlined in the admission history and physical.   PHYSICAL EXAMINATION:  VITAL SIGNS:  Blood pressure 124/78, pulse 86,  respirations 20, temperature 98.1.  O2 sats were 98% on room air.  GENERAL:  Patient appeared to be a healthy black male lying supine on the  bed.  HEENT:  Within normal limits except for mild scleral icterus.  NECK:  Supple.  LUNGS:  Clear to auscultation.  HEART:  Regular rate and rhythm without murmur.  ABDOMEN:  Soft.  GENITOURINARY:  Bilateral descended testes and uncircumcised penis.  NEUROLOGIC:  Grossly physiologic.  SKIN:  No significant lesions.   LABS:  CBC  revealed a white count of 9.5, hemoglobin 12.1, platelets 258,000  with repeat white count of 14 with hemoglobin of 9.9, hematocrit 27.3.  White blood cell morphology revealed target cells, __________ and hemoglobin  crystals.  Chemistries were not significantly remarkable.  Retic count was  1.8.   HOSPITAL COURSE:  The patient was given IV fluids of D5-1/2 normal saline  and analgesics.  He gradually improved and was able to be transitioned to  oral medicines and be discharged to outpatient management.   DISCHARGE MEDICATIONS:  1. MSIR 15 mg q.i.d. p.r.n. breakthrough pain.  2. MS Contin 30 mg b.i.d. p.r.n. pain.  3. He is to resume his folic acid 1 mg daily.  Lorelle Formosa, M.D.    WWM/MEDQ  D:  10/17/2003  T:  10/18/2003  Job:  [id number]

## 2010-09-12 NOTE — H&P (Signed)
[name], [name]                ACCOUNT NO.:  [id number]   MEDICAL RECORD NO.:  [id number]          PATIENT TYPE:  INP   LOCATION:  0106                         FACILITY:  [facility]   PHYSICIAN:  Fleet Contras, M.D.    DATE OF BIRTH:  05-19-1975   DATE OF ADMISSION:  06/03/2006  DATE OF DISCHARGE:                              HISTORY & PHYSICAL   PRESENTING COMPLAINT:  Right hip pain.   HISTORY OF PRESENT ILLNESS:  [name] is a 35 year old African American  gentleman with a past medical history significant for sickle cell  disease and avascular necrosis of the right hip status post right  hemiarthroplasty x2 in the past. He came to the emergency room with a 2-  day history of progressively worsening pain in the right hip,  nonradiating without associated history of trauma or injury.  He has had  no fevers or chills. He took his usual home medications of MS Contin and  MSIR but they did not adequately control his pain. At the emergency  room, he received multiple doses of intravenous Dilaudid but again his  pain was still severe and he is therefore being admitted for further  pain management.   PAST MEDICAL HISTORY:  1. Sickle cell disease.  2. Avascular necrosis of the hips.  3. History of pneumonia in the past.   SURGICAL HISTORY:  1. Right hemiarthroplasty in October 2006.  2. Right hip bone graft in 1995.   MEDICATION HISTORY:  1. MS Contin 60 mg q. 12.  2. MSIR 30 mg q.6 p.r.n.  3. Ibuprofen 800 mg t.i.d.  4. Folic acid 1 mg daily.   ALLERGIES:  He has no known drug allergies.   FAMILY HISTORY AND SOCIAL HISTORY:  He currently lives with his family.  He denies any use of alcohol, tobacco or illicit drugs   REVIEW OF SYSTEMS:  GENERAL:  He denies any fevers, chills or sweating.  CNS:  He has no headaches, dizziness, blurring of vision or slurring of  speech.  CVS:  He has no chest pain, shortness of breath, orthopnea or  PND.  RESPIRATORY:  He has no cough, sputum  production or hemoptysis.  GI:  He denies abdominal pain, nausea, vomiting or diarrhea.  GU: He has  no dysuria, frequency or hematuria   PHYSICAL EXAMINATION:  GENERAL:  He is sitting on the examination table  not in acute respiratory distress.  He has some mild to moderate painful  distress when he tries to stand on that right leg. He is not pale.  He  is not icteric.  He is not cyanosed, he is well hydrated.  VITAL SIGNS:  His vital signs are stable. Blood pressure 143/84, heart  rate 95 regular, respiratory rate of 14, temperature 99.1, O2 sats on  room air 97%.  CHEST:  Clear to auscultation.  HEART:  Heart sounds were not well heard with no murmurs, no S3 gallops.  ABDOMEN:  Flat, soft, nontender, no masses.  Bowel sounds are present.  EXTREMITIES:  Shows no edema, no calf tenderness or swelling.  MUSCULOSKELETAL:  There  is tenderness over the anterior aspect of the  right hip and there is pain on external rotation of the hip.  There is  no obvious deformity.   LABORATORY DATA:  His hemoglobin is 12.8, hematocrit 36.2.  His retic  count is 3.4%.   ASSESSMENT:  [name] is a 35 year old, African American gentleman with  past medical history significant for sickle cell anemia complicated by  avascular necrosis of the hips.  He is being admitted to the hospital  for pain in the right hip possibly due to flare of avascular necrosis.   ADMISSION DIAGNOSES:  1. Avascular necrosis of the right hip.  2. Sickle cell disease.   PLAN OF CARE:  He will be admitted to a medical bed. He will be on a  regular diet.  Vital signs q.4 h. He can ambulate as tolerated. He will  receive IV fluids half normal saline at 75 mL an hour. He will receive  IV Dilaudid 2-4 mg q.2 h p.r.n., IV Phenergan 12.5 mg q.4 h p.r.n.,  ibuprofen 800 t.i.d. His home medications will be continued. An  orthopedic consult will be requested if his pain does not subside or he  develops further symptoms.  This plan of  care has been discussed with  him and his questions answered.      Fleet Contras, M.D.  Electronically Signed     EA/MEDQ  D:  06/04/2006  T:  06/04/2006  Job:  [id number]

## 2010-09-12 NOTE — Discharge Summary (Signed)
NAMEBREKKEN, BEACH                ACCOUNT NO.:  [id number]   MEDICAL RECORD NO.:  [id number]          PATIENT TYPE:  INP   LOCATION:  1323                         FACILITY:  [facility]   PHYSICIAN:  Fleet Contras, M.D.    DATE OF BIRTH:  09-28-1975   DATE OF ADMISSION:  06/03/2006  DATE OF DISCHARGE:  06/05/2006                               DISCHARGE SUMMARY   HISTORY OF PRESENT ILLNESS:  Mr. Campusano is a 35 year old African-American  male with history of sickle cell disease, avascular necrosis of the  hips, presented to the emergency room at [facility] with 2  days of progressively worsening pain in the right hip which was not  completely relieved by his usual home regimen of MS-Contin and MSIR.  In  the emergency room, he received multiple doses of intravenous Dilaudid,  but his pain was not adequately relieved, and he was therefore admitted  for pain management.   HOSPITAL COURSE:  On admission, the patient was placed in a medical bed.  He was on IV fluids with half-normal at 5 mL/hr.  He received  intravenous Dilaudid 2-4 mg q 2 h and Phenergan12.5 MG q.4h. p.r.n.  His  usual home medications including MS Contin, ibuprofen, and Flexeril were  continued.  The symptoms improved during the course of hospitalization.  His hemoglobin was 12.8, hematocrit 36.2, reticulocyte count was 3.4.  Today, he is feeling much better.  He is able to ambulate in the hallway  without much distress, and he feels ready to go home.  His vital signs  are stable.  He is afebrile.  He is well-hydrated.  His chest is clear  to auscultation.  Abdomen is benign.  Bowel sounds are present.  His  right hip is slightly tender anteriorly, but there is no deformity or  swelling.  His peripheral pulses are present bilaterally.   DISCHARGE DIAGNOSES:  1. Avascular necrosis of the right hip.  2. Sickle cell anemia.   CONDITION ON DISCHARGE:  Stable.   DISPOSITION:  Home.   FOLLOW UP:  With me in 2-4  weeks, and he has an appointment to see an  orthopedic surgeon at [facility] in a few weeks.   DISCHARGE MEDICATIONS:  1. MS-Contin 60 mg q.12h.  2. MSIR 15 mg 1 p.o. q.6h. p.r.n.  3. Ibuprofen 800 t.i.d. p.r.n.  4. Flexeril 10 mg t.i.d. p.r.n.  5. Phenergan 25 mg q.6h. p.r.n.  6. Folic acid 1 mg daily.   His plan of care has been discussed with him and his questions answered.      Fleet Contras, M.D.  Electronically Signed     EA/MEDQ  D:  06/05/2006  T:  06/05/2006  Job:  [id number]

## 2010-09-12 NOTE — Discharge Summary (Signed)
[name]  Patient:    [name], [name] Visit Number: [id number] MRN: [id number]          Service Type: MED Location: [phone] Attending Physician:  Anastasio Auerbach Dictated by:   Anastasio Auerbach, M.D. Admit Date:  06/26/2001 Discharge Date: 06/29/2001   CC:         Meredith Staggers, M.D.   Discharge Summary  DATE OF BIRTH:  08-13-1975  DISCHARGE DIAGNOSES: 1. Acute sickle cell crisis:    a. Right tibial pain. 2. Sickle cell disease. 3. Anemia secondary to above:    a. Discharge hemoglobin 10.5.    b. On folic acid chronically.    c. Mean cell volume 97, reticulocyte count 2.3%. 4. Status post bone graft to the right tibia after avascular necrosis. 5. History of pneumonia, August 2002. 6. Tobacco use:    a. Half-pack-per-day.  ALLERGIES:  No known drug allergies.  DISCHARGE MEDICATIONS: 1. OxyContin 40 mg p.o. b.i.d. x5 days, suspense #10, no refills. 2. OxyIR 10 mg one p.o. q.4h. p.r.n. breakthrough pain, (dispense #10, no    refills). 3. Folic acid 1 mg q.d.  CONDITION UPON DISCHARGE:  Stable.  DISPOSITION:  Home.  RECOMMENDED ACTIVITY:  As tolerated.  RECOMMENDED DIET:  As tolerated, drink plenty of water.  SPECIAL INSTRUCTIONS: 1. Stop smoking. 2. Call or return if problems.  FOLLOWUP:  The patient is to contact Dr. Dayton Scrape to schedule a follow up in two weeks.  HOSPITAL COURSE:  #1 - ACUTE SICKLE PAIN CRISIS.  The patient is a 35 year old African American gentleman is [place] disease.  He presents with bilateral lower extremity pain, progressive over the past week.  This is primarily notable in the right anterior tibial area.  He has had similar episodes in the past.  He is admitted and given IV fluids and started on morphine PCA.  His admission hemoglobin was 12.0 with a reticulocyte percentage of 1.7.  After hydration, his hemoglobin settled at 10.5 with an MCV of 97.  His follow up reticulocyte count was 2.3.   He was tolerating oral pain medications at the time of discharge and will follow up with Dr. Dayton Scrape.  #2 - ANEMIA.  The patient does have [place] disease/suspected baseline hemoglobin is around 10-11.  He has had pneumonia in the past.  In general, _______ have splenomegaly.  I will defer any kind of vaccinations.  Will follow up to Dr. Dayton Scrape who follows with him regularly.  #3 - DEHYDRATION.  Resolved.  #4 - DECONDITIONING.  The patient did well with ambulation prior to discharge.  DISCHARGE LABORATORY DATA:  Sodium 137, potassium 4.2, chloride 106, bicarbonate 26, BUN 8, creatinine 0.8, glucose 77.  Total bilirubin 1.7, albumin 3.4.  Hemoglobin 10.5, MCV 97, WBC 7700, platelet count 280. Reticulocyte count 2.4.  ________ 2.3%. Dictated by:   Anastasio Auerbach, M.D. Attending Physician:  Anastasio Auerbach DD:  06/29/01 TD:  07/01/01 Job: [id number]

## 2010-09-12 NOTE — H&P (Signed)
[name], [name]                ACCOUNT NO.:  [id number]   MEDICAL RECORD NO.:  [id number]          PATIENT TYPE:  INP   LOCATION:  1322                         FACILITY:  [facility]   PHYSICIAN:  Fleet Contras, M.D.    DATE OF BIRTH:  [birth year]   DATE OF ADMISSION:  11/13/2005  DATE OF DISCHARGE:                                HISTORY & PHYSICAL   PRESENTING COMPLAINT:  Left-sided pain.   HISTORY OF PRESENT ILLNESS:  [name] is a 35 year old African-American  gentleman with past medical history significant for sickle cell disease,  avascular necrosis of the right hip status post right hemiarthroplasty in  October, 2006. He presented to the emergency room at [facility]  with a few days of progressively worsening pain on the left side going from  the rib all the way down to the left hip.  He denies any history of trauma  or falls. He has had no fevers or chills. The pain is not relieved by his  usual analgesic regimen of MS Contin and MSIR at home.  In the emergency  room he had received multiple doses of intravenous narcotic pain medicine  but continues to be in pain, rating his pain as 7 to 8 out of 10 from a  level of 10 out of 10 on presentation.  He has no pain in other joints. He  has had no abdominal pain, no vomiting, no diarrhea. Due to the persistent  nature of his pain he has been admitted to the hospital for pain control and  close monitoring.   PAST MEDICAL HISTORY:  1.  Sickle cell anemia, hemoglobin SS.  2.  Avascular necrosis of the hips.  3.  History of pneumonia in September, 2005.   MEDICATIONS:  1.  MS Contin 60 mg daily.  2.  MSIR 20 mg one p.o. q.6 hours p.r.n.   ALLERGIES:  No known drug allergies.   PAST SURGICAL HISTORY:  1.  He has had right hip bone grafting in 1995.  2.  Right hip hemiarthroplasty, October 2006 by Dr. Durene Romans.   FAMILY AND SOCIAL HISTORY:  He currently lives with his family.   REVIEW OF SYSTEMS:   Noncontributory.   PHYSICAL EXAMINATION:  GENERAL:  He is lying in bed in mild to moderate  painful distress. He is not having respiratory distress. He is not pale. He  is not icteric. No cyanosis.  VITAL SIGNS:  Blood pressure 134/93, heart rate of 53 and regular,  temperature 98.1, respiratory rate of 18, O2 saturation on room air 96%.  CHEST:  Clear to auscultation.  CARDIOVASCULAR:  Heart sounds 1 and 2 are heard with no murmurs.  ABDOMEN:  Soft, nontender, no masses, bowel sounds are present.  EXTREMITIES:  Show tenderness over the left hip region. No deformity, no  swelling, no edema of the legs. No calf tenderness.  CNS:  Alert and oriented x3 with no focal neurological deficits.   LABORATORY DATA:  White count is 10,700, hemoglobin 12.8, hematocrit 38,  platelet count of 290,000, retic count is 3.2%. Sodium is 135,  potassium  3.6, chloride 104, bicarbonate of 27, BUN of 7, creatinine 0.85, glucose of  91, calcium is 9.0.  Urine drug screen is positive for cannabinoid's and  opiates.   Chest x-ray is reported as negative for acute infiltrates.   ASSESSMENT:  [name] is a 35 year old African-American gentleman with past  medical history significant for sickle cell anemia as well as avascular  necrosis of the hips, right worse than the left, status post right  hemiarthroplasty. He presents with onset of pain on the left side involving  the left hip and not relieved by his usual pain medications at home.  He has  been admitted for pain management and monitoring.   ADMISSION DIAGNOSES:  1.  Sickle cell pain crisis.  2.  Avascular necrosis of the hip.   PLAN:  He will be admitted to a medical bed, he will be on a regular diet.  Vital signs q.6 hours. He can ambulate as tolerated. He will be on IV fluids  with half-normal saline at 75cc an hour, IV morphine 5 mg q.4 hours p.r.n.,  IV Phenergan 12.5 mg q.4 hours p.r.n.  His home medications will be  continued.  Plan of care  and his questions have been answered.      Fleet Contras, M.D.  Electronically Signed     EA/MEDQ  D:  11/13/2005  T:  11/13/2005  Job:  [id number]

## 2010-09-12 NOTE — H&P (Signed)
[name], [name]                ACCOUNT NO.:  [id number]   MEDICAL RECORD NO.:  [id number]          PATIENT TYPE:  INP   LOCATION:  1305                         FACILITY:  [facility]   PHYSICIAN:  Fleet Contras, M.D.    DATE OF BIRTH:  February 27, 1976   DATE OF ADMISSION:  02/01/2006  DATE OF DISCHARGE:  02/03/2006                                HISTORY & PHYSICAL   PRESENTING COMPLAINT:  Right shoulder pain.   HISTORY OF PRESENTING ILLNESS:  Mr. Spanos is a 35 year old right-handed  gentleman with a past medical history significant for sickle cell disease,  avascular necrosis of the right hip, and status post right hemiarthroplasty  in October of 2006.  He presented to the emergency room at [facility] with a 2-day history of progressively worsening pain in the right  shoulder radiating all the way down the arm.  He denies any history of  trauma during a fall.  He has had no fevers or chills.  He has been taking  his usual pain medications at home including MS Contin and MSIR, but this  did not give him adequate control.  In the emergency room he received  multiple doses of intravenous morphine injections, but his pain was still  greater than 8/10 and he was, therefore, admitted for further evaluation.  He had no pain in the other joints.  He had no abdominal pain, no chest  pain, no nausea, vomiting or diarrhea.   PAST MEDICAL HISTORY:  1. Sickle cell disease.  2. Avascular necrosis of the hips.  3. Bone infection, left shoulder.  4. History of pneumonia.   PAST SURGICAL HISTORY:  1. Right hemiarthroplasty in October of 2006.  2. Right hip bone graft in 1995.   MEDICATIONS:  He is on MS Contin 60 mg daily, MSIR 20 mg 1 p.o. q. 6h  p.r.n., ibuprofen 800 p.o. t.i.d., and folic acid 1 mg daily.   ALLERGIES:  HE HAS NO KNOWN DRUG ALLERGIES.   SOCIAL HISTORY:  Currently lives with his family.   REVIEW OF SYSTEMS:  As above.   PHYSICAL EXAMINATION:  GENERAL:  He is in mild  to moderate painful distress.  He is no pale, not icteric and not cyanosed.  He is well-hydrated.  VITAL SIGNS:  Initial vital signs show a blood pressure of 123/87, heart  rate of 84 and regular, respiratory rate of 20 and temperature of 98  degrees.  O2 sat on 2 L is 99%.  CHEST:  Clear to auscultation.  HEART:  S1 and S2 are heard with no murmurs.  ABDOMEN:  Soft, flat and nontender.  No masses.  Bowel sounds are present.  EXTREMITIES:  Show no edema, no calf tenderness or swelling.  He is tender  at the top of the right shoulder.  There is no obvious deformity and no  swelling.  Peripheral pulses are present bilaterally and equal.   LABORATORY DATA:  Initial laboratory data shows a white count of 18.8,  hemoglobin of 12.1, hematocrit of 34.7 and platelet count of 314; retic  count is 3.1%.  Sodium is 140, potassium is 3.8, chloride is 103,  bicarbonate is 27, BUN is 11, creatinine is 0.9 and glucose is 92.  Total  bilirubin is 3.2.  AST, ALT, alkaline phosphatase and albumin are all within  normal limits.   ASSESSMENT:  This is a 35 year old African American gentleman with a past  medical history significant for sickle cell anemia, complicated by a splenic  process of multiple joints including the hips, neck, and both sides of the  left shoulder, who now presents with pain to the right shoulder, which  suggests possible avascular necrosis.   ADMISSION DIAGNOSES:  1. Right shoulder pain, rule out avascular necrosis.  2. Sickle cell anemia.  3. History of avascular necrosis of the hip and both sides of the left      shoulder.   PLAN OF CARE:  1. Admitted to a medical bed.  2. Given a regular diet.  3. Vital signs q. 4h.  4. He can ambulate as tolerated.  5. Will receive IV fluids 1000 mL an hour.  6. IV morphine 5 mg q. 4h p.r.n.  7. IV Phenergan 12.5 mg q. 4h p.r.n.  8. Ibuprofen 800 mg t.i.d.  9. Flexeril 10 mg t.i.d. p.r.n.  10.X-ray and MRI scan of the right shoulder  performed to evaluate for      avascular necrosis.  11.Orthopedic consult will be requested for evaluation of possible steroid      injections to the right shoulder for pain relief.      Fleet Contras, M.D.  Electronically Signed     EA/MEDQ  D:  02/02/2006  T:  02/03/2006  Job:  [id number]

## 2010-09-12 NOTE — H&P (Signed)
Malik, [name]                ACCOUNT NO.:  [id number]   MEDICAL RECORD NO.:  [id number]          PATIENT TYPE:  INP   LOCATION:  1338                         FACILITY:  [facility]   PHYSICIAN:  Fleet Contras, M.D.    DATE OF BIRTH:  May 31, 1975   DATE OF ADMISSION:  08/25/2006  DATE OF DISCHARGE:                              HISTORY & PHYSICAL   PRESENTING COMPLAINT:  Right chest pain.   HISTORY OF PRESENT ILLNESS:  [name] is a 35 year old African American  gentleman with hemoglobin SS sickle cell disease, presenting to the  emergency room at [facility] with a few days of right-sided  chest pain which started gradually and progressively got worse. He did  have some dry cough with rare scant sputum.  He denied having any  hemoptysis.  No orthopnea, no PND, no shortness of breath or dyspnea on  exertion.  He had no left-sided chest pain.  He had no nausea, vomiting,  diarrhea, or abdominal pain.  At the emergency room, his vital signs  were stable, and he received intravenous analgesia therapy, but his  symptoms did not improve significantly for him to be discharged home.  He was therefore admitted for further pain management in the hospital.   PAST MEDICAL HISTORY:  1. Sickle cell disease.  2. Avascular necrosis of the hips  3. History of pneumonia in the past.   PAST SURGICAL HISTORY:  1. Right hemiarthroplasty in October 2006.  2. Right hip bone graft 1995.   MEDICATION HISTORY:  1. MS Contin 60 mg q.12 h.  2. MS-IR 15 mg q.6 h p.r.n.  3. Ibuprofen 800 t.i.d.  4. Flexeril 10 mg t.i.d.  5. Folic acid 1 mg daily.   ALLERGIES:  He has no known drug allergies.   FAMILY HISTORY AND SOCIAL HISTORY:  He lives with his family. Denies any  use of alcohol, tobacco, or illicit drugs.   REVIEW OF SYSTEMS:  Essentially as above.  He denies any fevers, chills,  or sweating.  He has no headaches, dizziness, blurring of vision, or  slurring of speech.  He has no pains in  his hips or shoulders today.  He  has no dysuria, frequency, or hematuria.   PHYSICAL EXAMINATION:  GENERAL:  He is in mild to moderate painful  distress.  He is not pale.  He is not icteric.  He is not cyanosed.  INITIAL VITAL SIGNS:  Blood pressure of 126/81, heart rate of 62,  respiratory rate of 16, temperature 98, O2 saturations on 2 liters of  nasal cannula 99%.  CHEST:  Clear to auscultation, but there is tenderness over the right  lower rib cage.  There are no obvious rashes or deformities or swelling.  He has good air entry bilaterally to the lungs, with no adventitious  sounds.  HEART:  Sounds 1 and 2 are heard, with no murmurs, no S3 gallops.  ABDOMEN:  Flat, soft, nontender, no masses.  Bowel sounds are present.  EXTREMITIES:  Show no edema, no calf tenderness or swelling.   LABORATORY DATA:  His retic count  she is 3%, white count was 12.1,  hemoglobin 11.8, hematocrit 34.5, platelet count of 242. Sodium was 136,  potassium 4, chloride 101, bicarbonate of 26, BUN 13, creatinine 0.37,  and glucose of 86, AST is 26, ALT 15, alkaline phosphatase 85, total  bilirubin is 2.8, and albumin is 4.2.  Chest x-ray is reported as  showing no acute cardiopulmonary process.  Urinalysis is negative.   ASSESSMENT:  [name] is a 35 year old African American gentleman with  sickle cell disease, admitted with painful right-sided chest wall, most  likely due to crisis involving the ribs.   ADMISSION DIAGNOSES:  1. Sickle cell painful crisis.  2. Chest wall pain.  3. History of avascular necrosis of the hip.   PLAN OF CARE:  He will be admitted to a medical bed.  He will be on IV  fluids, half-normal saline at 75 cc an hour.  He will receive  intravenous Dilaudid 2-4 mg q.4 h. p.r.n., and intravenous Phenergan  12.5 mg q.4 h p.r.n.  His home medication will be continued as above.  He will be on incentive spirometry q.2 h. while awake, and supplemental  oxygen therapy as needed.  This  plan of care has been discussed with him  and his questions answered.      Fleet Contras, M.D.  Electronically Signed     EA/MEDQ  D:  08/26/2006  T:  08/26/2006  Job:  [id number]

## 2010-09-12 NOTE — H&P (Signed)
[name], WIERZBICKI                            ACCOUNT NO.:  [id number]   MEDICAL RECORD NO.:  [id number]                   PATIENT TYPE:   LOCATION:                                       FACILITY:   PHYSICIAN:  Renato Battles, M.D.                  DATE OF BIRTH:  01-29-1976   DATE OF ADMISSION:  11/11/2003  DATE OF DISCHARGE:                                HISTORY & PHYSICAL   REASON FOR ADMISSION:  Left-sided chest and back pain.   HISTORY OF PRESENT ILLNESS:  The patient is a pleasant 35 year old African-  American male who has been experiencing progressively worse left-sided chest  and back pain for the last 48 hours.  The pain is worse with movement.  No  relieving factors except for staying still.  No fever, shortness of breath.  No dysuria or hematuria.   REVIEW OF SYSTEMS:  CONSTITUTIONAL:  No fever, chills, or night sweats.  No  weight changes.  GASTROINTESTINAL:  No nausea, vomiting, diarrhea, or constipation.  CARDIOPULMONARY:  No shortness of breath.  No orthopnea or PND.  GENITOURINARY:  No dysuria, hematuria, or urinary retention.   PAST MEDICAL HISTORY:  1. Sickle cell disease.  2. Right AVN.  3. Left ankle sprain.   PAST SURGICAL HISTORY:  1. Right bone graft.  2. It was mentioned somewhere in patient's medical record that patient had     bone marrow transplant.  The patient denies that.   FAMILY HISTORY:  The patient is adopted.  Does not know his family history.   SOCIAL HISTORY:  Smokes half pack a day cigarettes.  Denies alcohol.  Denies  drugs.  He lives with his father.  He does not work.   ALLERGIES:  No known drug allergies.   MEDICATIONS:  The only medication the patient takes is MS-IR 15 mg p.o.  q.4h. p.r.n. for pain.   PHYSICAL EXAMINATION:  GENERAL:  He is alert and oriented x3.  No acute  distress.  VITAL SIGNS:  Temperature 99.4, heart rate 88, respiratory rate 18, blood  pressure 108/71.  HEENT:  The head is atraumatic and  normocephalic.  Pupils are equal, round,  and reactive to light and accommodation.  Extraocular movement intact  bilaterally.  NECK:  No lymphadenopathy.  No thyromegaly.  No JVD.  CHEST:  Clear to auscultation bilaterally.  No wheezing, rales, or rhonchi.  HEART:  Regular rhythm, tachycardia.  No murmurs.  ABDOMEN:  Soft, nontender, nondistended.  Normoactive bowel sounds.  EXTREMITIES:  No clubbing, cyanosis, edema.   STUDIES:  White count was elevated 19.5 with 77% neutrophils.  Hemoglobin  was normal at 12.7 with MCV of 95.4.  Reticular count was normal at 2.2%.  Electrolytes were all within normal.  Kidney function is normal.  Glucose  normal.  Chest x-ray showed bibasilar changes suggestive of chronic  scarring.  Liver functions showed elevated bilirubin  at 3.8.  EKG showed  sinus tachycardia, otherwise negative.   ASSESSMENT/PLAN:  1. Back pain.  This could be sickle cell crisis.  The patient suddenly     confirmed that symptoms he is having are consistent with his pattern of     sickle cell crisis.  Going to start analgesia with intravenous Dilaudid     as needed.  Intravenous fluids will also be provided in addition to     supplemental oxygen.  Folic acid will be started as well.  At this point     I do not feel patient requires transfusion unless hemoglobin drops     significantly after rehydration.  2. Dehydration.  Going to start patient on intravenous fluids and monitor.  3. Abnormal chest x-ray.  The difference in technique between this chest x-     ray and the previous one is so significant that I cannot call whether     these chronic changes or acute ones.  Going to repeat chest x-ray once     patient is rehydrated.  Certainly, there is no other suggestion of     pneumonia at this point.  4. Chest pain.  This is very unlikely to be of coronary origin.  However, I     am going to check cardiac enzymes to be absolutely sure.  5. Chronic management of sickle cell disease.   The patient has not taken     hydroxyurea.  This seemed to be a result of not keeping follow-up with     primary care doctor as patient on hydroxyurea and needs to be monitored     monthly so this will not be started at this point.                                               Renato Battles, M.D.    SA/MEDQ  D:  11/11/2003  T:  11/11/2003  Job:  [id number]   cc:   Thayer Headings, M.D.  Int. Med. - Resident - [address]  [place], [place] [address]  Fax: [phone]

## 2010-09-12 NOTE — Discharge Summary (Signed)
[name], [name]                ACCOUNT NO.:  [id number]   MEDICAL RECORD NO.:  [id number]          PATIENT TYPE:  INP   LOCATION:  1338                         FACILITY:  [facility]   PHYSICIAN:  Fleet Contras, M.D.    DATE OF BIRTH:  08-17-1975   DATE OF ADMISSION:  08/25/2006  DATE OF DISCHARGE:  08/30/2006                               DISCHARGE SUMMARY   HISTORY OF PRESENTING ILLNESS:  [name] is a 35 year old African  American man with past medical history significant for sickle cell  disease, admitted via the emergency room at [facility] with  bilateral lower chest wall pain posteriorly also on the right side which  started gradually but progressively got worse.  He did have some cough  with scanty sputum which was yellow.  He denied having any hemoptysis,  no orthopnea, PND, shortness of breath, or dyspnea on exertion.  He  denied any nausea, vomiting, diarrhea, or abdominal pain.  At the  emergency room, he received intravenous Dilaudid, but his symptoms did  not improve sufficiently for him to be discharged home.  We therefore  admitted him to the hospital for further evaluation.   HOSPITAL COURSE:  On admission, he was in mild to moderate painful  distress.  He was not pale.  He was not icteric.  He was not cyanosed.  He was afebrile.  His vital signs were stable.  Chest showed tenderness  to the chest wall over the right posterior lower rib cage.  There were  no obvious rashes, deformities, or swelling.  His initial chest x-ray  revealed no acute cardiopulmonary process.  His hemoglobin was 11.8, his  white count was 12, reticulocyte count was 3%.  He was therefore  admitted to a medical bed and started on IV fluids and intravenous  Dilaudid.  His home medications were also continued.  During the course  of admission, his cough became more productive of yellowish sputum, and  he had decreased air entry on the right base, and a repeat chest x-ray  did reveal  worsening atelectasis, but pneumonia could not be ruled out.  He was therefore started on oral Avelox as well as Tussionex for the  cough, and his symptoms improved during that time. He has had no fevers.  His white count came down to 11.7, and his hemoglobin remained stable at  10.5.  Today, he is feeling much better.  The pain is less, and he feels  he can take control of the pain with oral analgesic agents at home.  He  is requiring less of a dose of Dilaudid and less frequently. He is not  pale.  He is not icteric.  He is not cyanosed.  His vital signs are  stable.  He is afebrile.  His chest did show some decreased air entry on  the right base, but better than on admission, and there is less  tenderness to the chest wall.  The abdomen is benign.  Extremities show  no edema or tenderness.  CNS is alert and oriented x3.   ASSESSMENT:  [name]  is a 35 year old African American  gentleman with past medical history significant for sickle cell disease,  admitted to the hospital with chest wall pain which is pleuritic in  nature, most likely due to pneumonia in the right lower lobe.  He has  received oral Avelox as well as a analgesic medication, and his symptoms  have improved, and he is considered stable for discharge home today.   DISCHARGE DIAGNOSES:  1. Right lower lobe pneumonia.  2. Pleuritic chest pain.  3. Sickle cell painful crisis.  4. Sickle cell anemia.   CONDITION ON DISCHARGE:  Stable.   DISPOSITION:  Home.   FOLLOWUP:  Will be with me in 1-2 weeks.   DISCHARGE MEDICATIONS:  1. Folic acid 1 mg daily.  2. MS Contin 60 mg q.12 h.  3. MS-IR 15 mg q.6 h. p.r.n.  4. Avelox 400 mg daily.  5. Ibuprofen 800 mg t.i.d. p.r.n.   He will continue the Avelox for 5 more days and then stop.  This plan of  care has been discussed with him and his questions answered.      Fleet Contras, M.D.  Electronically Signed     EA/MEDQ  D:  08/30/2006  T:  08/30/2006   Job:  [id number]

## 2010-09-12 NOTE — Op Note (Signed)
[name]                ACCOUNT NO.:  [id number]   MEDICAL RECORD NO.:  [id number]          PATIENT TYPE:  INP   LOCATION:  1509                         FACILITY:  [facility]   PHYSICIAN:  Madlyn Frankel. Charlann Boxer, M.D.  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   PROCEDURE:  Conversion of previous right hip surgery to right total hip  replacement.   SURGEON:  Madlyn Frankel. Charlann Boxer, M.D.   ASSISTANTDruscilla Brownie. Cherlynn June.   COMPONENTS USED:  DePuy hip system with a size 56 ASR cup and S/ROM femoral  size 20 x 15, 61F large with a 49+0 ball.   ANESTHESIA:  General.   BLOOD LOSS:  600 cc.   DRAINS:  One.   COMPLICATIONS:  None.   INDICATIONS FOR PROCEDURE:  [name] is a 35 year old male who had presented  to the office with right hip pain.  Radiographs revealed failed free  vascularized fibular grafting for AVN.  Apparently, the patient had some  collapse prior to his surgery but report notes are not available.  The  patient is on significant narcotic pain medicine just for his hip pain.  We  discussed the risks and benefits of hip replacement surgery and discussed  bearing surfaces, risks and benefits.  They discussed this at length based  on his age.  At this point, current technology will allow for metal-on-metal  with large ASR technology with increased surface area, increased fluid film  layer and decreased wear versus ceramics.  The large head size would provide  better range of motion in a potentially younger, active patient.  We did  discuss the possibility of squeaking in both and having to convert to a new  hip replacement if this was the  case.   Consent was obtained.   PROCEDURE IN DETAIL:  Patient was brought to the operating theater.  Once  adequate anesthesia and preoperative antibiotics were performed with 1 gm of  Ancef were administered, the patient was positioned in the left lateral  decubitus position with the right side up.  The right lower extremity was  then prepped and draped in a sterile fashion.  The patient's old incision  was noted to be anterior and lateral.  This incision was extended from a  portion of this and then posterior.  The posterior approach to the hip was  carried out with development of exposure of the iliotibial band and gluteus  maximus fascia.  This was incised from a posterior approach.  The patient's  previous K wire that was utilized to hold the fibula in place was identified  and removed, utilizing a plier.  At this point, a standard posterior  approach was carried out with taking down the external rotators separate  from the posterior  capsule, which was saved for retraction and protection  against the sciatic nerve.  A portion of this was later repaired to the  superior leaflet.   Hip exposure was obtained, and hip dislocation carried out.  The patient's  femoral head was noted to be proximal to the tip of the trochanter.  A neck  osteotomy was made based off of the anatomical landmarks.  Based off this  neck cut, femoral preparation would be carried out.  At this point,  acetabular exposure was carried out, including retraction of the femur  anteriorly.  With acetabular exposure, reaming commenced with a 45 reamer.  This was taken down to the median wall and sequentially reamed up.  At 55  reaming, I had excellent bone purchase, particularly in the superior lateral  posterior and medial anterior wall.  With this, I decided to go ahead and  place the 56 cup.  The final 56 cup was positioned and then checked in  position with a hip guide and noted to be about 35-40 degrees of  abduction  and 15 degrees of forward flexion.  It was in an anatomic position beneath  the anterior rim.  Given these parameters, attention was now directed to the  femur.  The patient's old fibular strut was very well incorporated, and  thus, a high speed bur was utilized to remove a portion of this.  The canal  was then identified using a hand reamer.   Once this was done, sequential reaming and axial reaming distally was  carried up to where I had gotten an excellent cortical purchase with a 15  reamer.  This 15.5 reamer was then drilled down 3/4 of the way down.  At  this point, proximal reaming was carried out and noted to be 52F.  Note that  burring of the fibula was carried out to allow for this.  Milling was then  carried out to an S large.  Trial reduction was then carried out initially  with a 36+8 stem; however, when this stem was reduced, it felt that there  was too much tension on the iliotibial band.  I then tried a 36 standard  neck.  With the 49+0 ball, this had an excellent range of motion.  Leg  lengths appeared equal to the down leg and was not excessively tight along  the iliotibial band.  There was no evidence of impingement.  Note that the  patient's sleeve was positioned in around 5-10 degrees of anteversion, and I  added 20 degrees of anteversion to a trial.  With this, there was a touch of  impingement with external rotation in extension.  For this reason, I went  ahead and backed the final component back down to about 15 degrees of  anteversion.   At this point, the final components were brought into the field, including  the 49 ball and the 0 mm taper.  The final 52F large sleeve was impacted  followed by placement of a 20 x 15 36 standard stem and about 15 degrees of  anteversion, giving me a femoral anteversion angle of about 20-25 degrees.  The combined anteversion was 45 degrees.  The final 49 ball was impacted onto the clean and dried trunnion and hip  reduced.  The hip was copiously  irrigated throughout the case and again at the end of the case.  Medium  Hemovac drain was placed.  The posterior capsular leaflet was reapproximated  to the superior leaflet, adding to some  support but not impinging into the  femoral neck/head area.  The piriformis and short external rotators were not  reapproximated.  At this point, the remainder of the wound was closed in  layers with #1 Ethibond on the iliotibial band and #1 Vicryl on the gluteus  maximus fascia.  Vicryl 2-0 was used in the subcu layer followed by 4-0  Monocryl.  At this point, the patient was extubated and taken to the  recovery room in a stable condition.  The patient tolerated the procedure  without known complications.      Madlyn Frankel Charlann Boxer, M.D.  Electronically Signed     MDO/MEDQ  D:  02/20/2005  T:  02/20/2005  Job:  [id number]

## 2010-09-12 NOTE — Discharge Summary (Signed)
[name], [name]                ACCOUNT NO.:  [id number]   MEDICAL RECORD NO.:  [id number]          PATIENT TYPE:  INP   LOCATION:  0352                         FACILITY:  [facility]   PHYSICIAN:  Lorelle Formosa, M.D.DATE OF BIRTH:  Apr 13, 1976   DATE OF ADMISSION:  05/30/2004  DATE OF DISCHARGE:  06/03/2004                                 DISCHARGE SUMMARY   ADMISSION DIAGNOSIS:  Sickle cell vaso-occlusive crisis.   DISCHARGE DIAGNOSIS:  Sickle cell vaso-occlusive crisis.   DISCHARGE MEDICATIONS:  1. MS Contin 50 mg b.i.d. p.r.n. pain.  2. MSIR 30 mg q.i.d. p.r.n. breakthrough pain.  3. Ibuprofen 800 mg t.i.d. p.r.n. pain of lesser severity.  4. Folic acid 1 mg daily.     HISTORY:  This patient is a 35 year old black male who has known SS sickle  cell disease.  He presented to my office on the day of admission complaining  of moderate pain and was admitted for more aggressive pain control.  He was  seen in the emergency room 2-3 days prior to this and referred to my office  for having hours of IV fluids, analgesics and nasal cannula O2.  The  patient's pain medicines were doubled from that point for a day to try to  subside his pain.  His pain medicines were doubled from that date to try to  subside his pain.  His home medicines include MS Contin 50 mg b.i.d., MSIR  30 mg q.i.d. p.r.n. breakthrough pain and folic acid 1 mg daily.  He also  takes ibuprofen admittedly for pain of lesser type.  The patient was thus  admitted.   His past medical history, family history, personal history, social history  and review of systems are outlined in his recent admissions here.   PHYSICAL EXAMINATION:  VITAL SIGNS:  Blood pressure 130/70, pulse 68,  respirations 20, temperature 98.7 degrees orally.  GENERAL APPEARANCE:  The patient is an alert and oriented black male who  appeared in moderate discomfort.  HEENT:  PERRLA.  EOMs were intact.  NECK:  Supple.  There was no jugular venous  distention, bruits, nodes or  thyromegaly.  CHEST:  Clear to auscultation.  HEART:  Regular rhythm and rate without murmur.  ABDOMEN:  Soft and flat.  EXTREMITIES:  Normal.  NEUROLOGICAL:  Grossly physiologic.  SKIN:  Revealed no lesions.   LABORATORY:  CBC, which was done on February 1st, revealed a white count of  15.1 with hemoglobin of 12.7, hematocrit of 30.4 and platelet count of  315,000.  Retic count was 2.6.  His BMET was normal with glucose of 102,  bilirubin of 2.7 and SGOT of 41.  Portable chest x-ray at that time revealed  no evidence of acute abnormality.  There was stable cardiomegaly and mild  peribronchial thickening.   HOSPITAL COURSE:  The patient was admitted to the hospital and given IV  fluids and analgesics with nasal cannula O2.  Analgesics included Dilaudid 2-  4 mg IV q.3h. with Protonix 40 mg p.o. and Benadryl 50 mg p.o. or IV.  He  was also given  Phenergan 25 mg by mouth to preserve his vein as he does not  have a Port-A-Cath.  The patient had some worsening of pain, and the  frequency of Dilaudid was increased to every 2-3 hours.  He gradually  improved and was transitioned to oral medicines, which he is tolerating  well.  He has a good appetite and is ambulatory without difficulty.  He did  have some complaint of pain more in his right shoulder and proximal arm  regions.  He showed that he had some popping of his shoulder when he goes  through a range of motion.  The patient will have this monitored on an  outpatient basis.  Thus, he is discharged for outpatient management.      WWM/MEDQ  D:  06/03/2004  T:  06/03/2004  Job:  [id number]

## 2010-09-12 NOTE — H&P (Signed)
NAMEJEVIN, CAMINO                ACCOUNT NO.:  [id number]   MEDICAL RECORD NO.:  [id number]          PATIENT TYPE:  INP   LOCATION:  NA                           FACILITY:  [facility]   PHYSICIAN:  Madlyn Frankel. Charlann Boxer, M.D.  DATE OF BIRTH:  November 17, 1975   DATE OF ADMISSION:  DATE OF DISCHARGE:                                HISTORY & PHYSICAL   ADMISSION DIAGNOSIS:  Failed previous hip surgery for diagnosis of avascular  necrosis in the setting of sickle cell anemia.   SECONDARY DIAGNOSIS:  Sickle cell anemia.   HISTORY OF PRESENT ILLNESS:  Imani is a 35 year old black male with a  history of sickle cell anemia who presented for evaluation of right hip  pain. He has a history of a right hip avascular necrosis with subsequent  surgical intervention in 1995.  He had a free vascularized fibula grafting  procedure performed in [facility].  He has had progressive and increasing  discomfort in his right hip requiring significant pain medication usage.  At  this point he has no other complaints or concerns.   PAST SURGICAL HISTORY:  1.  Right hip revascularized fibula grafting done in 1995 at [facility].  2.  History of a left ankle fracture.   PAST MEDICAL HISTORY:  1.  Sickle cell anemia.  2.  Right hip avascular necrosis.  3.  History of pneumonia in September 2005.  4.  No recent sickle cell crises.   ALLERGIES:  No known drug allergies.   MEDICATIONS:  Morphine sulfate 60 mg daily.   FAMILY HISTORY:  Noncontributory.   REVIEW OF SYSTEMS:  Unremarkable other than the past two weeks for any upper  respiratory, pulmonary, cardiac, gastrointestinal, genitourinary issues.   PHYSICAL EXAMINATION:  VITAL SIGNS:  Temperature 98.1, pulse 64,  respirations 16, blood pressure 120/68.  GENERAL:  He is awake, alert and oriented and cooperative, in no acute  distress.  HEAD AND NECK:  Normal with no evidence of any asymmetry.  NECK:  Supple with no nodes palpable.  Negative bruits to  auscultation.  CHEST:  Clear to auscultation bilaterally.  HEART:  Regular rate and rhythm.  No murmurs detected.  ABDOMEN:  Soft, nontender with positive bowel sounds.  EXTREMITIES:  Examination of the right lower extremity revealed that he is  neurovascularly intact.  He has a limited, painful range of motion to the  right hip compared to left hip.  Incision is well-healed.  No signs of  cellulitis.   LABORATORY DATA:  Radiographs revealed a failed right revascularized fibula  grafting for avascular necrosis with advanced collapse and exposed fibular  head.   IMPRESSION:  As above.   PLAN:  The patient will be admitted on February 20, 2005 for same-day  admission for conversion of previous right hip surgery for the total hip  replacement.  The risks and benefits have been reviewed in the office and  discussed again today.  Plan will be for an S-ROM hip system with an ASR  ball to provide as much longevity and range of motion in this 35 year old  male.  Questions were encouraged and answered and reviewed. Consent will be  obtained based on discussion today.      Madlyn Frankel Charlann Boxer, M.D.  Electronically Signed     MDO/MEDQ  D:  02/18/2005  T:  02/18/2005  Job:  [id number]

## 2010-09-12 NOTE — Discharge Summary (Signed)
NAME:  [name], [name]                          ACCOUNT NO.:  [id number]   MEDICAL RECORD NO.:  [id number]                   PATIENT TYPE:  INP   LOCATION:  6045                                 FACILITY:  [facility]   PHYSICIAN:  Lorelle Formosa, M.D.           DATE OF BIRTH:  Feb 27, 1976   DATE OF ADMISSION:  08/30/2003  DATE OF DISCHARGE:  09/02/2003                                 DISCHARGE SUMMARY   ADMISSION DIAGNOSIS:  Sickle cell vaso-occlusive crisis.   DISCHARGE DIAGNOSIS:  Sickle cell vaso-occlusive crisis.   DISCHARGE CONDITION:  Stable.   DISCHARGE DISPOSITION:  Follow up in the office in approximately a week.   DISCHARGE MEDICATIONS:  1. MS Contin 60 mg b.i.d.  2. MSIR 15 mg q.d. p.r.n. breakthrough pain.  3. Folic acid 1 mg daily.   HISTORY:  This patient is a 35 year old single black male who presented with  hurting in his legs with difficulty in walking.  He was treated several  hours by the emergency department physician without relief and referred to  me for further inpatient care.  Patient was discharged from this hospital  from April 18 through the 27th with vaso-occlusive crisis.  His past medical  history, family history, social history, review of systems, and personal  history are outlined and obtained from that admission.   PHYSICAL EXAMINATION:  VITAL SIGNS:  Blood pressure 132/74, pulse 74,  respirations 18, temperature 98.5.  O2 sats normal.  GENERAL:  Patient is lying supine in bed in some discomfort.  HEENT:  PERRLA.  EOMs are intact.  NECK:  Supple.  LUNGS:  Clear to auscultation.  HEART:  Regular rate and rhythm without murmur.  ABDOMEN:  Soft and flat.  GENITOURINARY:  Normal male with bilaterally descended testicles.  NEUROLOGIC:  Grossly physiologic.  SKIN:  No rashes.   LABORATORY DATA:  CBC revealed a white count of 14.2 with a hematocrit of  34.2, hemoglobin 11.6, platelets 482,000.  Reticulocyte count was 6.2.   HOSPITAL COURSE:  The  patient was given IV fluids of D5-1/2 normal saline  with Dilaudid 4 mg and Phenergan 12.5 mg IV q.3h.  He improved and was able  to be transitioned to oral medicine and remained comfortable.  He will be  discharged for outpatient management.                                               Lorelle Formosa, M.D.    WWM/MEDQ  D:  10/17/2003  T:  10/18/2003  Job:  [id number]

## 2010-10-10 ENCOUNTER — Emergency Department (HOSPITAL_COMMUNITY): Payer: Medicare Other

## 2010-10-10 ENCOUNTER — Inpatient Hospital Stay (HOSPITAL_COMMUNITY)
Admission: EM | Admit: 2010-10-10 | Discharge: 2010-10-15 | DRG: 812 | Disposition: A | Discharge status: Home / Self Care | Payer: Medicare Other | Attending: Internal Medicine | Admitting: Internal Medicine

## 2010-10-10 DIAGNOSIS — E871 Hypo-osmolality and hyponatremia: Secondary | ICD-10-CM | POA: Diagnosis present

## 2010-10-10 DIAGNOSIS — K5900 Constipation, unspecified: Secondary | ICD-10-CM | POA: Diagnosis present

## 2010-10-10 DIAGNOSIS — F17200 Nicotine dependence, unspecified, uncomplicated: Secondary | ICD-10-CM | POA: Diagnosis present

## 2010-10-10 DIAGNOSIS — Z96649 Presence of unspecified artificial hip joint: Secondary | ICD-10-CM

## 2010-10-10 DIAGNOSIS — D5700 Hb-SS disease with crisis, unspecified: Principal | ICD-10-CM | POA: Diagnosis present

## 2010-10-10 LAB — RETICULOCYTES
RBC.: 3.19 MIL/uL — ABNORMAL LOW (ref 4.22–5.81)
Retic Count, Absolute: 114.8 10*3/uL (ref 19.0–186.0)
Retic Ct Pct: 3.6 % — ABNORMAL HIGH (ref 0.4–3.1)

## 2010-10-10 LAB — BASIC METABOLIC PANEL
Calcium: 9.4 mg/dL (ref 8.4–10.5)
GFR calc Af Amer: >60 mL/min (ref >60)
GFR calc non Af Amer: >60 mL/min (ref >60)
Glucose, Bld: 83 mg/dL (ref 70–99)
Potassium: 4.2 mEq/L (ref 3.5–5.1)
Sodium: 134 mEq/L — ABNORMAL LOW (ref 135–145)

## 2010-10-10 LAB — DIFFERENTIAL
Basophils Relative: 1 % (ref 0–1)
Eosinophils Relative: 7 % — ABNORMAL HIGH (ref 0–5)
Lymphocytes Relative: 28 % (ref 12–46)
Monocytes Relative: 11 % (ref 3–12)
Neutrophils Relative %: 53 % (ref 43–77)

## 2010-10-10 LAB — CBC
Hemoglobin: 10.4 g/dL — ABNORMAL LOW (ref 13.0–17.0)
MCH: 32.6 pg (ref 26.0–34.0)
Platelets: 277 10*3/uL (ref 150–400)
RBC: 3.19 MIL/uL — ABNORMAL LOW (ref 4.22–5.81)
WBC: 12.4 10*3/uL — ABNORMAL HIGH (ref 4.0–10.5)

## 2010-10-10 NOTE — H&P (Signed)
[name], [name]                ACCOUNT NO.:  [id number]  MEDICAL RECORD NO.:  [id number]  LOCATION:  WLED                         FACILITY:  [facility]  PHYSICIAN:  Malik Blower, MD       DATE OF BIRTH:  1975-08-18  DATE OF ADMISSION:  10/10/2010 DATE OF DISCHARGE:                             HISTORY & PHYSICAL   PRIMARY CARE PHYSICIAN:  [name], M.D.  CHIEF COMPLAINT:  Right arm and hip pain.  HISTORY OF PRESENT ILLNESS:  [name] is a 35 year old African-American male with history of sickle cell disease and anemia who presents with right arm and hip pain.  He reports that his symptoms started about 2 days ago and have been gradually getting worse.  It is a constant pain in the right arm, elbow and right hip.  The patient also reports that the pain has extended now to the right side of his chest.  He reports that this pain is similar to his previous sickle cell crisis.  The patient has received about 6 mg of IV Dilaudid in the ER with only minimal relief in his symptoms.  As a result, the hospitalist service was consulted to help manage his pain further.  He denies any recent fevers, chills.  Denies any nausea, vomiting.  Denies any chest pain other than pain in right side of the chest that comes and goes. Currently denies any chest pain.  Denies any shortness of breath. Denies any abdominal pain, diarrhea.  Denies any headaches or vision changes.  He does report that he has had intermittent cough but denies any production of sputum.  REVIEW OF SYSTEMS:  All systems were reviewed with the patient with positive as per HPI; otherwise, all other systems are negative.  PAST MEDICAL HISTORY: 1. History of sickle cell crisis. 2. Sickle cell disease. 3. Sickle cell anemia. 4. History of avascular necrosis of both hips, status post bilateral     hip replacement. 5. Tobacco use.  SOCIAL HISTORY:  The patient reports that they he is smoking one pack over a period of week.   He has been trying to quit and has been using patches intermittently.  Denies any alcohol use.  Denies any illegal drugs or substances.  Currently disabled.  FAMILY HISTORY:  Reports that he is adopted.  HOME MEDICATIONS: 1. Afrin one nasal spray twice daily. 2. Milk of magnesia 30 mL as needed for constipation. 3. Phenergan 25 mg every 12 hours as needed for nausea. 4. Nicotine patches 14 mg one patch daily 5. Morphine sulfate CR 60 mg p.o. q.2 hour. 6. Morphine sulfate 30 mg p.o. q.4 h as needed for pain. 7. Ibuprofen 800 mg p.o. 3 times a day as needed. 8. Hydroxyurea 500 mg p.o. daily 9. Folic acid 1 mg p.o. daily. 10.Albuterol inhaler 2 puffs every 4 hours as needed.  PHYSICAL EXAMINATION:  VITAL SIGNS:  Temperature is 97.7, blood pressure is 114/71, heart rate of 62, respirations 20, satting 95% on room air. GENERAL:  The patient was alert and oriented, did not appear to be in acute distress, is laying in bed comfortably. HEENT:  Extraocular motions are intact.  Pupils equal, round.  Had moist mucous membranes. NECK:  Supple. HEART:  Regular with S1 and S2. LUNGS:  Clear.  Had good air movement, scattered crackles at bases particularly on the right. ABDOMEN:  Soft, nontender, nondistended.  Positive bowel sounds. EXTREMITIES:  The patient good peripheral pulses.  Trace edema. NEURO:  Cranial nerves II through XII grossly intact.  Had 5/5 motor strength in upper as well as lower extremities.  RADIOLOGY/IMAGING:  No imaging obtained.  LABORATORY DATA:  CBC shows a white count of 12.4, hemoglobin 10.4, hematocrit 29.0, platelet count 277.  Electrolytes normal except sodium is 134, BUN is 9, creatinine 0.53.  ASSESSMENT AND PLAN: 1. Sickle-cell crisis, pain not well controlled.  We will admit the     patient for pain management.  We will start aggressive IV hydration     with half normal saline solution.  We will also have him on oxygen.     We will have him on p.r.n.  Dilaudid.  We will resume his home     medications.  We will get a chest x-ray as he has reported that he     has some intermittent cough to see if he has any etiology in the     chest that could be triggering the crisis. 2. Anemia, likely due to sickle cell disease.  Hemoglobin stable at     this time.  Has had exchange transfusion in March of 2012 for his     previous sickle cell crisis. 3. Tobacco use disorder.  Encourage cessation, continue nicotine     patches.  Counseled the patient on smoking cessation.  The patient     indicated to try electronic cigarette after his discharge. 4. Leukocytosis, likely due to painful crisis.  Monitor for now. 5. Mild hyponatremia likely due to pain.  Monitor for now. 6. Prophylaxis.  Lovenox for DVT prophylaxis. 7. Code status.  The patient is full code.  Time spent on admission, talking to the patient, coordinating care was 45 minutes.   Malik Blower, MD   SR/MEDQ  D:  10/10/2010  T:  10/10/2010  Job:  [id number]  Electronically Signed by Wardell Heath Denyla Cortese  on 10/10/2010 03:18:37 PM

## 2010-10-11 LAB — BASIC METABOLIC PANEL
BUN: 8 mg/dL (ref 6–23)
CO2: 29 mEq/L (ref 19–32)
Creatinine, Ser: 0.62 mg/dL (ref 0.50–1.35)
GFR calc Af Amer: >60 mL/min (ref >60)
Potassium: 4.2 mEq/L (ref 3.5–5.1)
Sodium: 137 mEq/L (ref 135–145)

## 2010-10-11 LAB — CBC
MCH: 32.8 pg (ref 26.0–34.0)
Platelets: 250 10*3/uL (ref 150–400)
RBC: 2.99 MIL/uL — ABNORMAL LOW (ref 4.22–5.81)

## 2010-10-12 LAB — CBC
HCT: 27.5 % — ABNORMAL LOW (ref 39.0–52.0)
MCH: 32.6 pg (ref 26.0–34.0)
MCHC: 35.6 g/dL (ref 30.0–36.0)
RDW: 15.6 % — ABNORMAL HIGH (ref 11.5–15.5)

## 2010-10-14 LAB — CBC
MCH: 33.1 pg (ref 26.0–34.0)
MCHC: 36.3 g/dL — ABNORMAL HIGH (ref 30.0–36.0)
MCV: 91.3 fL (ref 78.0–100.0)
Platelets: 242 10*3/uL (ref 150–400)
RBC: 2.99 MIL/uL — ABNORMAL LOW (ref 4.22–5.81)
RDW: 16 % — ABNORMAL HIGH (ref 11.5–15.5)

## 2010-10-14 LAB — RETICULOCYTES: Retic Ct Pct: 3.2 % — ABNORMAL HIGH (ref 0.4–3.1)

## 2010-11-05 NOTE — Discharge Summary (Signed)
  NAMECASSIDY, [name]                ACCOUNT NO.:  [id number]  MEDICAL RECORD NO.:  [id number]  LOCATION:  1320                         [facility]ACILITY:  [facility]  PHYSICIAN:  Brendia Sacks, MD    DATE OF BIRTH:  1975-07-14  DATE OF ADMISSION:  10/10/2010 DATE OF DISCHARGE:                              DISCHARGE SUMMARY   TENTATIVE DATE OF DISCHARGE:  October 15, 2010.  PRIMARY CARE PHYSICIAN:  Fleet Contras, MD  DISPOSITION:  Home.  CONDITION ON DISCHARGE:  Improved.  DISCHARGE DIAGNOSES: 1. Sickle-cell pain crisis. 2. Cigarette smoker.  HISTORY OF PRESENT ILLNESS:  This is a 35 year old man who presented to the emergency room with right arm and right hip pain consistent with previous sickle-cell pain crises.  HOSPITAL COURSE: 1. Sickle-cell pain crisis:  The patient was admitted to the medical     floor, placed on IV fluids and patient-controlled analgesia.  His     condition has slowly improved.  His hemoglobin has remained stable.     He is feeling better and is potentially ready for discharge     tomorrow.  His PCA has been discontinued and he was continued on     his chronic pain medications.  If he does well throughout the     evening and pain remains controlled tomorrow, he will be discharged     home, can follow up with Dr. Fleet Contras in 1 to 2 weeks.  CONSULTATIONS:  None.  PROCEDURES:  None.  IMAGING:  Chest x-ray:  Mild cardiomegaly without pulmonary edema. Scarring in both lungs, right greater than left.  No acute cardiopulmonary disease.  PERTINENT LABORATORY STUDIES: 1. CBC notable for hemoglobin of 9.9, which is stable.  White blood     cell count, platelet count within normal limits. 2. Basic metabolic panel unremarkable.  DISCHARGE INSTRUCTIONS: 1. The patient will be discharged home if his pain is controlled on     June 20. 2. Diet is a regular diet. 3. Activity as tolerated. 4. Recommend discontinuing smoking. 5. Follow up with Dr. Fleet Contras in  1 to 2 weeks.  DISCHARGE MEDICATIONS: 1. Colace 100 mg p.o. b.i.d. 2. Senna 2 tablets p.o. daily as needed for constipation. 3. Afrin 1 spray nasally b.i.d. as needed. 4. Albuterol inhaler 2 puffs every 4 hours as needed for shortness of     breath. 5. Folic acid 1 mg p.o. daily. 6. Hydroxyurea 500 mg p.o. daily. 7. Ibuprofen 800 mg p.o. t.i.d. as needed for pain. 8. Milk of Magnesia 30 mL daily as needed for constipation. 9. Morphine sulfate 30 mg p.o. every 4 hours as needed for     breakthrough pain. 10.Morphine sulfate controlled release 60 mg p.o. q.12 h. 11.Nicotine patch 40 mg per 24 hours as nonsmoking. 12.Phenergan 25 mg 1 tablet every 12 hours as needed for nausea.  Time coordinating discharge, 20 minutes.     Brendia Sacks, MD     DG/MEDQ  D:  10/14/2010  T:  10/14/2010  Job:  [id number]  cc:   Fleet Contras, M.D. Fax: [phone]  Electronically Signed by Brendia Sacks  on 11/05/2010 06:28:59 PM

## 2010-12-09 ENCOUNTER — Emergency Department (HOSPITAL_COMMUNITY): Payer: Medicare Other

## 2010-12-09 ENCOUNTER — Emergency Department (HOSPITAL_COMMUNITY)
Admission: EM | Admit: 2010-12-09 | Discharge: 2010-12-10 | Disposition: A | Discharge status: Home / Self Care | Payer: Medicare Other | Attending: Emergency Medicine | Admitting: Emergency Medicine

## 2010-12-09 DIAGNOSIS — R0602 Shortness of breath: Secondary | ICD-10-CM | POA: Insufficient documentation

## 2010-12-09 DIAGNOSIS — D571 Sickle-cell disease without crisis: Secondary | ICD-10-CM | POA: Insufficient documentation

## 2010-12-09 DIAGNOSIS — F17200 Nicotine dependence, unspecified, uncomplicated: Secondary | ICD-10-CM | POA: Insufficient documentation

## 2010-12-09 DIAGNOSIS — R079 Chest pain, unspecified: Secondary | ICD-10-CM | POA: Insufficient documentation

## 2010-12-09 LAB — CBC
Hemoglobin: 10.3 g/dL — ABNORMAL LOW (ref 13.0–17.0)
MCH: 33 pg (ref 26.0–34.0)
MCV: 89.7 fL (ref 78.0–100.0)
RBC: 3.12 MIL/uL — ABNORMAL LOW (ref 4.22–5.81)
WBC: 11.9 10*3/uL — ABNORMAL HIGH (ref 4.0–10.5)

## 2010-12-09 LAB — BASIC METABOLIC PANEL
BUN: 14 mg/dL (ref 6–23)
CO2: 28 mEq/L (ref 19–32)
Chloride: 101 mEq/L (ref 96–112)
Creatinine, Ser: 0.65 mg/dL (ref 0.50–1.35)
Glucose, Bld: 88 mg/dL (ref 70–99)

## 2010-12-09 LAB — RETICULOCYTES
Retic Count, Absolute: 143.5 10*3/uL (ref 19.0–186.0)
Retic Ct Pct: 4.6 % — ABNORMAL HIGH (ref 0.4–3.1)

## 2010-12-09 LAB — DIFFERENTIAL
Basophils Relative: 1 % (ref 0–1)
Eosinophils Relative: 9 % — ABNORMAL HIGH (ref 0–5)
Lymphs Abs: 2.7 10*3/uL (ref 0.7–4.0)
Monocytes Absolute: 1.3 10*3/uL — ABNORMAL HIGH (ref 0.1–1.0)

## 2010-12-18 ENCOUNTER — Emergency Department (HOSPITAL_COMMUNITY): Payer: Medicare Other

## 2010-12-18 ENCOUNTER — Inpatient Hospital Stay (HOSPITAL_COMMUNITY)
Admission: EM | Admit: 2010-12-18 | Discharge: 2010-12-22 | DRG: 193 | Disposition: A | Discharge status: Home / Self Care | Payer: Medicare Other | Attending: Internal Medicine | Admitting: Internal Medicine

## 2010-12-18 DIAGNOSIS — Z96649 Presence of unspecified artificial hip joint: Secondary | ICD-10-CM

## 2010-12-18 DIAGNOSIS — J189 Pneumonia, unspecified organism: Principal | ICD-10-CM | POA: Diagnosis present

## 2010-12-18 DIAGNOSIS — D5700 Hb-SS disease with crisis, unspecified: Secondary | ICD-10-CM | POA: Diagnosis present

## 2010-12-18 LAB — CBC
HCT: 28.8 % — ABNORMAL LOW (ref 39.0–52.0)
MCHC: 35.8 g/dL (ref 30.0–36.0)
Platelets: 298 10*3/uL (ref 150–400)
RDW: 14.4 % (ref 11.5–15.5)
WBC: 12.7 10*3/uL — ABNORMAL HIGH (ref 4.0–10.5)

## 2010-12-18 LAB — BASIC METABOLIC PANEL
CO2: 26 mEq/L (ref 19–32)
GFR calc non Af Amer: >60 mL/min (ref >60)
Glucose, Bld: 88 mg/dL (ref 70–99)
Potassium: 4.1 mEq/L (ref 3.5–5.1)
Sodium: 137 mEq/L (ref 135–145)

## 2010-12-18 LAB — DIFFERENTIAL
Basophils Absolute: 0.1 10*3/uL (ref 0.0–0.1)
Eosinophils Absolute: 0.6 10*3/uL (ref 0.0–0.7)
Lymphocytes Relative: 16 % (ref 12–46)
Monocytes Relative: 10 % (ref 3–12)
Neutro Abs: 8.7 10*3/uL — ABNORMAL HIGH (ref 1.7–7.7)
Neutrophils Relative %: 68 % (ref 43–77)

## 2010-12-19 LAB — URINALYSIS, ROUTINE W REFLEX MICROSCOPIC
Leukocytes, UA: NEGATIVE
Nitrite: NEGATIVE
Protein, ur: NEGATIVE mg/dL
Specific Gravity, Urine: 1.008 (ref 1.005–1.030)
Urobilinogen, UA: 0.2 mg/dL (ref 0.0–1.0)

## 2010-12-19 LAB — RAPID URINE DRUG SCREEN, HOSP PERFORMED
Cocaine: NOT DETECTED
Opiates: POSITIVE — AB
Tetrahydrocannabinol: NOT DETECTED

## 2010-12-20 LAB — CBC
HCT: 25.8 % — ABNORMAL LOW (ref 39.0–52.0)
Hemoglobin: 9.4 g/dL — ABNORMAL LOW (ref 13.0–17.0)
MCH: 32.9 pg (ref 26.0–34.0)
MCHC: 36.4 g/dL — ABNORMAL HIGH (ref 30.0–36.0)
MCV: 90.2 fL (ref 78.0–100.0)
RBC: 2.86 MIL/uL — ABNORMAL LOW (ref 4.22–5.81)
RDW: 14.3 % (ref 11.5–15.5)

## 2010-12-20 LAB — BASIC METABOLIC PANEL
BUN: 6 mg/dL (ref 6–23)
CO2: 25 mEq/L (ref 19–32)
Calcium: 9.1 mg/dL (ref 8.4–10.5)
Creatinine, Ser: 0.65 mg/dL (ref 0.50–1.35)
GFR calc non Af Amer: >60 mL/min (ref >60)
Glucose, Bld: 88 mg/dL (ref 70–99)
Potassium: 4 mEq/L (ref 3.5–5.1)
Sodium: 139 mEq/L (ref 135–145)

## 2010-12-22 LAB — CBC
Hemoglobin: 8.3 g/dL — ABNORMAL LOW (ref 13.0–17.0)
MCH: 32.2 pg (ref 26.0–34.0)
MCHC: 35.9 g/dL (ref 30.0–36.0)
MCV: 89.5 fL (ref 78.0–100.0)
Platelets: 269 10*3/uL (ref 150–400)
RBC: 2.58 MIL/uL — ABNORMAL LOW (ref 4.22–5.81)

## 2011-01-05 ENCOUNTER — Emergency Department (HOSPITAL_COMMUNITY): Payer: Medicare Other

## 2011-01-05 ENCOUNTER — Inpatient Hospital Stay (HOSPITAL_COMMUNITY)
Admission: EM | Admit: 2011-01-05 | Discharge: 2011-01-12 | DRG: 812 | Disposition: A | Discharge status: Home / Self Care | Payer: Medicare Other | Attending: Internal Medicine | Admitting: Internal Medicine

## 2011-01-05 DIAGNOSIS — R091 Pleurisy: Secondary | ICD-10-CM | POA: Diagnosis present

## 2011-01-05 DIAGNOSIS — D5700 Hb-SS disease with crisis, unspecified: Principal | ICD-10-CM | POA: Diagnosis present

## 2011-01-05 DIAGNOSIS — K5900 Constipation, unspecified: Secondary | ICD-10-CM | POA: Diagnosis not present

## 2011-01-05 DIAGNOSIS — Z96649 Presence of unspecified artificial hip joint: Secondary | ICD-10-CM

## 2011-01-05 DIAGNOSIS — Z79899 Other long term (current) drug therapy: Secondary | ICD-10-CM

## 2011-01-05 LAB — BASIC METABOLIC PANEL
CO2: 26 mEq/L (ref 19–32)
Chloride: 101 mEq/L (ref 96–112)
GFR calc non Af Amer: >60 mL/min (ref >60)
Glucose, Bld: 91 mg/dL (ref 70–99)
Potassium: 4.1 mEq/L (ref 3.5–5.1)
Sodium: 137 mEq/L (ref 135–145)

## 2011-01-05 LAB — DIFFERENTIAL
Basophils Relative: 1 % (ref 0–1)
Eosinophils Absolute: 0.8 10*3/uL — ABNORMAL HIGH (ref 0.0–0.7)
Eosinophils Relative: 5 % (ref 0–5)
Lymphs Abs: 1.8 10*3/uL (ref 0.7–4.0)
Monocytes Absolute: 1.4 10*3/uL — ABNORMAL HIGH (ref 0.1–1.0)

## 2011-01-05 LAB — CBC
Hemoglobin: 11 g/dL — ABNORMAL LOW (ref 13.0–17.0)
MCH: 32.8 pg (ref 26.0–34.0)
MCHC: 36.1 g/dL — ABNORMAL HIGH (ref 30.0–36.0)
MCV: 91 fL (ref 78.0–100.0)
RBC: 3.35 MIL/uL — ABNORMAL LOW (ref 4.22–5.81)

## 2011-01-06 LAB — BASIC METABOLIC PANEL
BUN: 12 mg/dL (ref 6–23)
CO2: 25 mEq/L (ref 19–32)
Calcium: 9.1 mg/dL (ref 8.4–10.5)
Glucose, Bld: 79 mg/dL (ref 70–99)
Sodium: 137 mEq/L (ref 135–145)

## 2011-01-06 LAB — CBC
HCT: 27.5 % — ABNORMAL LOW (ref 39.0–52.0)
Hemoglobin: 9.9 g/dL — ABNORMAL LOW (ref 13.0–17.0)
MCH: 32.5 pg (ref 26.0–34.0)
MCV: 90.2 fL (ref 78.0–100.0)
RBC: 3.05 MIL/uL — ABNORMAL LOW (ref 4.22–5.81)

## 2011-01-07 LAB — CBC
MCH: 32.3 pg (ref 26.0–34.0)
MCHC: 35.4 g/dL (ref 30.0–36.0)
MCV: 91.1 fL (ref 78.0–100.0)
Platelets: 347 10*3/uL (ref 150–400)
RBC: 3.16 MIL/uL — ABNORMAL LOW (ref 4.22–5.81)

## 2011-01-07 LAB — BASIC METABOLIC PANEL
BUN: 7 mg/dL (ref 6–23)
CO2: 26 mEq/L (ref 19–32)
Chloride: 103 mEq/L (ref 96–112)
Glucose, Bld: 84 mg/dL (ref 70–99)
Potassium: 3.7 mEq/L (ref 3.5–5.1)

## 2011-01-08 LAB — CBC
HCT: 26.2 % — ABNORMAL LOW (ref 39.0–52.0)
Hemoglobin: 9.3 g/dL — ABNORMAL LOW (ref 13.0–17.0)
MCH: 32.5 pg (ref 26.0–34.0)
MCHC: 35.5 g/dL (ref 30.0–36.0)
MCV: 91.6 fL (ref 78.0–100.0)
RBC: 2.86 MIL/uL — ABNORMAL LOW (ref 4.22–5.81)

## 2011-01-09 LAB — CBC
Hemoglobin: 9.1 g/dL — ABNORMAL LOW (ref 13.0–17.0)
MCH: 32.2 pg (ref 26.0–34.0)
MCV: 91.2 fL (ref 78.0–100.0)
RBC: 2.83 MIL/uL — ABNORMAL LOW (ref 4.22–5.81)
WBC: 9.4 10*3/uL (ref 4.0–10.5)

## 2011-01-09 LAB — BASIC METABOLIC PANEL
CO2: 25 mEq/L (ref 19–32)
Calcium: 9.2 mg/dL (ref 8.4–10.5)
Chloride: 105 mEq/L (ref 96–112)
Creatinine, Ser: 0.57 mg/dL (ref 0.50–1.35)
Glucose, Bld: 82 mg/dL (ref 70–99)

## 2011-01-11 LAB — COMPREHENSIVE METABOLIC PANEL
ALT: 20 U/L (ref 0–53)
Alkaline Phosphatase: 86 U/L (ref 39–117)
BUN: 8 mg/dL (ref 6–23)
CO2: 24 mEq/L (ref 19–32)
GFR calc Af Amer: >60 mL/min (ref >60)
GFR calc non Af Amer: >60 mL/min (ref >60)
Glucose, Bld: 108 mg/dL — ABNORMAL HIGH (ref 70–99)
Potassium: 3.8 mEq/L (ref 3.5–5.1)
Total Protein: 7.1 g/dL (ref 6.0–8.3)

## 2011-01-11 LAB — CBC
HCT: 25.8 % — ABNORMAL LOW (ref 39.0–52.0)
MCHC: 35.7 g/dL (ref 30.0–36.0)
Platelets: 340 10*3/uL (ref 150–400)
RDW: 14.5 % (ref 11.5–15.5)
WBC: 10.2 10*3/uL (ref 4.0–10.5)

## 2011-01-11 LAB — RETICULOCYTES
RBC.: 2.83 MIL/uL — ABNORMAL LOW (ref 4.22–5.81)
Retic Count, Absolute: 130.2 10*3/uL (ref 19.0–186.0)
Retic Ct Pct: 4.6 % — ABNORMAL HIGH (ref 0.4–3.1)

## 2011-01-28 NOTE — H&P (Signed)
[name], [name]                ACCOUNT NO.:  [id number]  MEDICAL RECORD NO.:  [id number]  LOCATION:  [facility]LED                         FACILITY:  [facility]  PHYSICIAN:  Carlota Raspberry, MD         DATE OF BIRTH:  29-Sep-1975  DATE OF ADMISSION:  01/05/2011 DATE OF DISCHARGE:                             HISTORY & PHYSICAL   PRIMARY CARE PHYSICIAN:  Fleet Contras, M.D.  CHIEF COMPLAINT:  Right-sided chest pain.  HISTORY OF PRESENT ILLNESS:  This is a 35 year old male with a history of sickle cell disease who was admitted here on August 23rd for right- sided chest pain with chest x-ray at that time showing a new right lower lobe infiltrate.  He was admitted, given IV Avelox, IV fluids, and O2 and his white blood cell count was 12.7, hemoglobin 10.3 that admission.  He went home and finished his course of Avelox, and then Friday night he started having worsening right-sided chest pain and back pain that persisted through Saturday night, for which he tried some MS Contin 60 mg and 30 mg of MSIR, but this does not help to relieve his pain and it persisted such that he presented to the emergency room earlier tonight where his initial vitals were 98.3, blood pressure 123/80, pulse 93, respirations 20.  Through his course in the emergency room, he was seen to have a stable hematocrit of 30.5, which is above his baseline, white blood cell count is 15.4, platelets are 364 also above baseline and his retic count was 4.2.  His oxygenation has been normal on room air and he had gotten 2 liters of IV fluids.  In review of systems, he relates the above history and also endorses some shortness of breath, but no dizziness, light-headedness, nausea, vomiting, or abdominal pain.  He has had no cough, denies any fever or chills, but does endorse some sweats this morning.  He has had no stroke symptoms.  He states that the pain is solely on the right side of the chest and nothing on the left.  REVIEW OF  SYSTEMS:  Otherwise negative.  PAST MEDICAL HISTORY:  Sickle cell disease.  PAST SURGICAL HISTORY:  Hip replacement and bone graft.  MEDICATION LIST:  As reconciled by the pharmacy and includes, 1. Phenergan 25 mg q.12 as needed. 2. Nicotine patch. 3. Avelox 400 mg q.24 that he already finished. 4. MSIR 30 mg q.4 p.r.n. 5. MS Contin 60 mg q.12. 6. Lactulose as needed. 7. Folate 1 mg daily. 8. Albuterol inhaler as needed. 9. Afrin 1 spray twice daily as needed. 10.Milk of magnesia 30 as needed.  However, the patient states that he only takes folic acid on a daily basis.  ALLERGIES:  He has no known drug allergies.  SOCIAL HISTORY:  He is on disability and lives at home with his father. He denies alcohol, tobacco, or drug use.  He is not married, has no children.  FAMILY HISTORY:  The patient is adopted.  PHYSICAL EXAMINATION:  VITAL SIGNS:  Most recent vitals are 104/69, pulse 71, 25, and 97.9 temperature, his systolics have ranged 109-116 on the monitor, and he is saturating steadily 96% on  room air while I am talking to him. GENERAL:  He is a young, well-developed, well-nourished, well-appearing man in some discomfort. HEENT:  His pupils are equal, round, reactive to light and accommodation.  Extraocular muscles are intact.  Sclerae clear and normal appearing.  His mouth is moist and normal appearing. LUNGS:  Clear to auscultation except at his bilateral bases.  He has pain inspiratory crackles going around from posteriorly to anteriorly, but the rest of his lung fields are clear. HEART:  Regular rate and rhythm without any murmurs or gallops. ABDOMEN:  Soft, nontender, nondistended, and benign. EXTREMITIES:  Warm, well perfused without cyanosis, clubbing.  There is no bilateral lower extremity edema and his muscle tone in his extremities is good. NEUROLOGIC:  Intact with no focal neurological deficits noted.  LABORATORY WORK:  His white blood cell count is 15.4 just  above its baseline.  His hematocrit is 30.5 and this is clearly above his baseline.  His platelets are 364, also above his baseline.  Chemistry is normal including calcium 9.7.  IMAGING STUDIES:  Chest x-ray shows stable patchy bilateral air space opacities, right greater than left.  There is no EKG.  IMPRESSION:  This is a 35 year old male with a history of sickle cell disease and a recent admission for right-sided chest pain, status post course of Avelox who now is admitted with persistent right-sided chest pain and back pain. 1. Sickle cell crisis.  We will admit him and get his pain under     control with IV Dilaudid and p.o. oxycodone.  We will give him IV     fluids and oxygen at 2 liters per minute, despite the fact that he     is stable on room air.  In review of his chest x-ray and the fact     that he has completed a course of Avelox, I think that an acute     chest syndrome is not an unreasonable consideration, given that he     is now being seen despite being treated with IV antibiotics.     Nevertheless, at this time his white blood cell count is above its     baseline and I will treat him with a little bit of stepped-up     antibiotics with IV vancomycin and ceftriaxone and see how he does     with this, but I am not entirely convinced that this is pure     pneumonia, given the lack of purulence sputum or frank fever.     Otherwise, there are no acute issues.  Fluid, electrolytes, and     nutrition, we will give him a 100 cc per hour of normal saline     continuous and he can get a regular diet. 2. IV access.  He has 1 peripheral IV. 3. Prophylaxis.  We will give him a bowel regimen while he is     admitted, subcutaneous heparin, pain control as     above. 4. Code status.  He is a full code as I discussed with him.  The patient will be admitted to [facility] 5.          ______________________________ Carlota Raspberry, MD     EB/MEDQ  D:  01/05/2011  T:   01/05/2011  Job:  [id number]  Electronically Signed by Carlota Raspberry MD on 01/28/2011 12:15:00 PM

## 2011-02-01 NOTE — Discharge Summary (Signed)
  NAMEDAMERE, BRANDENBURG                ACCOUNT NO.:  [id number]  MEDICAL RECORD NO.:  [id number]  LOCATION:  1520                         FACILITY:  [facility]  PHYSICIAN:  Peggye Pitt, M.D. DATE OF BIRTH:  November 13, 1975  DATE OF ADMISSION:  01/05/2011 DATE OF DISCHARGE:  01/12/2011                              DISCHARGE SUMMARY   DISCHARGE DIAGNOSIS:  Painful sickle cell vasoocclusive crisis.  DISCHARGE MEDICATIONS:  Include; 1. Zofran 4 mg every 6 hours as needed for pain. 2. Albuterol inhaler 2 puffs every 4 hours as needed for shortness of     breath. 3. Folic acid 1 mg daily. 4. Lactulose 30 mL daily as needed for constipation. 5. Milk of Magnesia 30 mL daily as needed for constipation. 6. MS Contin 60 mg every 12 hours. 7. MSIR 30 mg every 4 hours as needed for pain. 8. Nicotine patch 14 mg to change daily. 9. Phenergan 1 tablet every 12 hours as needed for nausea.  CONSULTATION OF THIS HOSPITALIZATION:  None.  DISPOSITION AND FOLLOWUP:  [name] will be discharged home today in stable and improved condition.  He will need to follow up with his primary care physician within 1-2 weeks.  IMAGES AND PROCEDURES PERFORMED DURING THIS HOSPITALIZATION:  Include a chest x-ray on September 10 that showed stable patchy bilateral airspace opacities right greater than left.  HISTORY AND PHYSICAL:  For full details please see dictation by Dr. Kaylyn Layer on September 10.  However, in brief [name] is a pleasant 35- year-old black gentleman with a history of sickle cell disease.  He was admitted on August 23 for right-sided chest pain and chest x-ray that showed a new right lower lobe infiltrate.  He was admitted given Avelox fluids and was sent home.  He started having worsening chest pain and came into the hospital for further evaluation.  HOSPITAL COURSE BY PROBLEM:  Painful vasoocclusive crisis in a patient with sickle cell anemia:  He has completed his course of Avelox, he  was started back on his oral narcotics.  His pain is now very well controlled, he is no longer short of breath, not requiring oxygen.  I believe he is ready for discharge home today.  He has been instructed to follow up with his PCP within 2 weeks.  VITAL SIGNS ON DAY OF DISCHARGE:  Blood pressure 108/63, heart rate 50, respirations 20, sats 97% on room air, and temperature of 98.0.     Peggye Pitt, M.D.     EH/MEDQ  D:  01/12/2011  T:  01/12/2011  Job:  [id number]  Electronically Signed by Peggye Pitt M.D. on 02/01/2011 07:58:52 AM

## 2011-02-10 LAB — URINALYSIS, ROUTINE W REFLEX MICROSCOPIC
Bilirubin Urine: NEGATIVE
Hgb urine dipstick: NEGATIVE
Ketones, ur: NEGATIVE
Nitrite: NEGATIVE
Protein, ur: NEGATIVE
Specific Gravity, Urine: 1.012
Urobilinogen, UA: 1

## 2011-02-10 LAB — CBC
HCT: 31.4 — ABNORMAL LOW
Hemoglobin: 11.5 — ABNORMAL LOW
MCHC: 34.8
MCHC: 34.8
MCV: 99.2
MCV: 99.3
MCV: 99.6
Platelets: 199
Platelets: 211
Platelets: 218
Platelets: 273
RBC: 3.69 — ABNORMAL LOW
RDW: 15.7 — ABNORMAL HIGH
RDW: 16.4 — ABNORMAL HIGH
RDW: 16.8 — ABNORMAL HIGH
WBC: 15.1 — ABNORMAL HIGH
WBC: 15.4 — ABNORMAL HIGH

## 2011-02-10 LAB — COMPREHENSIVE METABOLIC PANEL
ALT: 26
ALT: 33
ALT: 38
AST: 38 — ABNORMAL HIGH
AST: 42 — ABNORMAL HIGH
AST: 50 — ABNORMAL HIGH
Albumin: 3.7
Albumin: 3.8
Alkaline Phosphatase: 74
Alkaline Phosphatase: 76
BUN: 1 — ABNORMAL LOW
CO2: 28
CO2: 29
Calcium: 9.2
Calcium: 9.4
Chloride: 101
Chloride: 104
Creatinine, Ser: 0.73
Creatinine, Ser: 0.74
GFR calc Af Amer: >60
GFR calc Af Amer: >60
GFR calc non Af Amer: >60
GFR calc non Af Amer: >60
GFR calc non Af Amer: >60
Potassium: 3.6
Potassium: 4
Sodium: 140
Sodium: 140
Total Bilirubin: 4.9 — ABNORMAL HIGH
Total Protein: 6.9
Total Protein: 6.9
Total Protein: 7.6

## 2011-02-10 LAB — DIFFERENTIAL
Basophils Absolute: 0
Basophils Absolute: 0.1
Eosinophils Relative: 3
Eosinophils Relative: 4
Lymphocytes Relative: 10 — ABNORMAL LOW
Lymphocytes Relative: 19
Lymphocytes Relative: 9 — ABNORMAL LOW
Lymphs Abs: 1.6
Monocytes Relative: 11
Neutro Abs: 11.9 — ABNORMAL HIGH
Neutro Abs: 7.6
Neutrophils Relative %: 77

## 2011-02-10 LAB — RETICULOCYTES
RBC.: 3.66 — ABNORMAL LOW
Retic Count, Absolute: 91.5

## 2011-02-10 LAB — SEDIMENTATION RATE: Sed Rate: 3

## 2011-05-12 ENCOUNTER — Encounter (HOSPITAL_COMMUNITY): Payer: Self-pay | Admitting: *Deleted

## 2011-05-12 ENCOUNTER — Inpatient Hospital Stay (HOSPITAL_COMMUNITY)
Admission: EM | Admit: 2011-05-12 | Discharge: 2011-05-22 | DRG: 812 | Disposition: A | Discharge status: Home / Self Care | Payer: Medicare Other | Attending: Internal Medicine | Admitting: Internal Medicine

## 2011-05-12 DIAGNOSIS — D5701 Hb-SS disease with acute chest syndrome: Secondary | ICD-10-CM | POA: Diagnosis present

## 2011-05-12 DIAGNOSIS — Z79899 Other long term (current) drug therapy: Secondary | ICD-10-CM

## 2011-05-12 DIAGNOSIS — J40 Bronchitis, not specified as acute or chronic: Secondary | ICD-10-CM | POA: Diagnosis present

## 2011-05-12 DIAGNOSIS — Z720 Tobacco use: Secondary | ICD-10-CM | POA: Diagnosis present

## 2011-05-12 DIAGNOSIS — D5700 Hb-SS disease with crisis, unspecified: Secondary | ICD-10-CM | POA: Diagnosis present

## 2011-05-12 DIAGNOSIS — K5900 Constipation, unspecified: Secondary | ICD-10-CM | POA: Diagnosis present

## 2011-05-12 DIAGNOSIS — G894 Chronic pain syndrome: Secondary | ICD-10-CM | POA: Diagnosis present

## 2011-05-12 DIAGNOSIS — F17200 Nicotine dependence, unspecified, uncomplicated: Secondary | ICD-10-CM | POA: Diagnosis present

## 2011-05-12 DIAGNOSIS — J9819 Other pulmonary collapse: Secondary | ICD-10-CM | POA: Diagnosis present

## 2011-05-12 HISTORY — DX: Idiopathic aseptic necrosis of unspecified femur: M87.059

## 2011-05-12 HISTORY — DX: Osteomyelitis, unspecified: M86.9

## 2011-05-12 HISTORY — DX: Reserved for inherently not codable concepts without codable children: IMO0001

## 2011-05-12 HISTORY — DX: Hb-SS disease with crisis, unspecified: D57.00

## 2011-05-12 HISTORY — DX: Encounter for other specified aftercare: Z51.89

## 2011-05-12 HISTORY — DX: Pneumonia, unspecified organism: J18.9

## 2011-05-12 MED ORDER — SODIUM CHLORIDE 0.9 % IV BOLUS (SEPSIS)
1000.0000 mL | Freq: Once | INTRAVENOUS | Status: AC
  Administered 2011-05-13: 1000 mL via INTRAVENOUS

## 2011-05-12 MED ORDER — ONDANSETRON HCL 4 MG/2ML IJ SOLN
4.0000 mg | Freq: Once | INTRAMUSCULAR | Status: AC
  Administered 2011-05-13: 4 mg via INTRAVENOUS
  Filled 2011-05-12: qty 2

## 2011-05-12 MED ORDER — HYDROMORPHONE HCL PF 1 MG/ML IJ SOLN
1.0000 mg | Freq: Once | INTRAMUSCULAR | Status: AC
  Administered 2011-05-13: 1 mg via INTRAVENOUS
  Filled 2011-05-12: qty 1

## 2011-05-12 MED ORDER — DIPHENHYDRAMINE HCL 50 MG/ML IJ SOLN
25.0000 mg | Freq: Once | INTRAMUSCULAR | Status: AC
  Administered 2011-05-13: 25 mg via INTRAVENOUS
  Filled 2011-05-12: qty 1

## 2011-05-12 NOTE — ED Notes (Signed)
Pt in c/o sickle cell crisis, pain to back and chest, states this is typical for his crisis, pain x4-5 days

## 2011-05-13 ENCOUNTER — Emergency Department (HOSPITAL_COMMUNITY): Payer: Medicare Other

## 2011-05-13 ENCOUNTER — Encounter (HOSPITAL_COMMUNITY): Payer: Self-pay | Admitting: Family Medicine

## 2011-05-13 DIAGNOSIS — D5700 Hb-SS disease with crisis, unspecified: Secondary | ICD-10-CM | POA: Diagnosis present

## 2011-05-13 LAB — BASIC METABOLIC PANEL
BUN: 11 mg/dL (ref 6–23)
CO2: 25 mEq/L (ref 19–32)
Glucose, Bld: 80 mg/dL (ref 70–99)
Potassium: 3.8 mEq/L (ref 3.5–5.1)
Sodium: 137 mEq/L (ref 135–145)

## 2011-05-13 LAB — CBC
HCT: 32.7 % — ABNORMAL LOW (ref 39.0–52.0)
Hemoglobin: 12.1 g/dL — ABNORMAL LOW (ref 13.0–17.0)
RBC: 3.62 MIL/uL — ABNORMAL LOW (ref 4.22–5.81)

## 2011-05-13 MED ORDER — HYDROMORPHONE HCL PF 1 MG/ML IJ SOLN
INTRAMUSCULAR | Status: AC
  Administered 2011-05-13: 1 mg via INTRAVENOUS
  Filled 2011-05-13: qty 1

## 2011-05-13 MED ORDER — HYDROMORPHONE HCL PF 1 MG/ML IJ SOLN
1.0000 mg | Freq: Once | INTRAMUSCULAR | Status: AC
  Administered 2011-05-13: 1 mg via INTRAVENOUS
  Filled 2011-05-13: qty 1

## 2011-05-13 MED ORDER — MORPHINE SULFATE 30 MG PO TABS
30.0000 mg | ORAL_TABLET | ORAL | Status: DC | PRN
  Administered 2011-05-14: 30 mg via ORAL
  Filled 2011-05-13: qty 1

## 2011-05-13 MED ORDER — ENOXAPARIN SODIUM 40 MG/0.4ML ~~LOC~~ SOLN
40.0000 mg | SUBCUTANEOUS | Status: DC
  Administered 2011-05-13 – 2011-05-22 (×5): 40 mg via SUBCUTANEOUS
  Filled 2011-05-13 (×10): qty 0.4

## 2011-05-13 MED ORDER — FOLIC ACID 1 MG PO TABS
1.0000 mg | ORAL_TABLET | Freq: Every day | ORAL | Status: DC
  Administered 2011-05-13 – 2011-05-22 (×10): 1 mg via ORAL
  Filled 2011-05-13 (×10): qty 1

## 2011-05-13 MED ORDER — MORPHINE SULFATE CR 30 MG PO TB12
60.0000 mg | ORAL_TABLET | Freq: Two times a day (BID) | ORAL | Status: DC
  Administered 2011-05-13 – 2011-05-22 (×19): 60 mg via ORAL
  Filled 2011-05-13 (×19): qty 2

## 2011-05-13 MED ORDER — PROMETHAZINE HCL 25 MG PO TABS
25.0000 mg | ORAL_TABLET | Freq: Four times a day (QID) | ORAL | Status: DC | PRN
  Administered 2011-05-13 – 2011-05-15 (×5): 25 mg via ORAL
  Filled 2011-05-13 (×5): qty 1

## 2011-05-13 MED ORDER — NICOTINE 7 MG/24HR TD PT24
7.0000 mg | MEDICATED_PATCH | Freq: Every day | TRANSDERMAL | Status: DC
  Administered 2011-05-14 – 2011-05-22 (×9): 7 mg via TRANSDERMAL
  Filled 2011-05-13 (×9): qty 1

## 2011-05-13 MED ORDER — HYDROMORPHONE HCL PF 2 MG/ML IJ SOLN
2.0000 mg | INTRAMUSCULAR | Status: DC | PRN
  Administered 2011-05-13 – 2011-05-14 (×6): 2 mg via INTRAVENOUS
  Filled 2011-05-13 (×6): qty 1

## 2011-05-13 MED ORDER — POTASSIUM CHLORIDE IN NACL 20-0.9 MEQ/L-% IV SOLN
INTRAVENOUS | Status: DC
  Administered 2011-05-13: 23:00:00 via INTRAVENOUS
  Administered 2011-05-13: 1000 mL via INTRAVENOUS
  Administered 2011-05-14 – 2011-05-15 (×3): via INTRAVENOUS
  Filled 2011-05-13 (×11): qty 1000

## 2011-05-13 NOTE — ED Notes (Signed)
Pt reports increased pain 8/10. Pt medicated per prn orders.

## 2011-05-13 NOTE — H&P (Signed)
PCP:   Dorrene German, MD, MD   Chief Complaint:  Chest and back pain  HPI: This is a 36 year old male with known history of sickle cell disease, who states his chest and back pain for the last 4-5 days. He states he's been taking his home by mouth medications without any improvement, therefore decided to come to the ER today. Reports no fevers, no chills, no nausea, no vomiting, no cough, no runny noses, no wheezing. He does report some shortness of breath with deep breathing. In the year he was on bleed with pain medications the hospitalist service was called with a request to admit.   Review of Systems: positives bolded anorexia, fever, weight loss,, vision loss, decreased hearing, hoarseness, chest pain, syncope, dyspnea on exertion, peripheral edema, balance deficits, hemoptysis, abdominal pain, melena, hematochezia, severe indigestion/heartburn, hematuria, incontinence, genital sores, muscle weakness, suspicious skin lesions, transient blindness, difficulty walking, depression, unusual weight change, abnormal bleeding, enlarged lymph nodes, angioedema, and breast masses.  Past Medical History: Past Medical History  Diagnosis Date  . Sickle cell crisis    Past Surgical History  Procedure Date  . Orif hip fracture     Medications: Prior to Admission medications   Medication Sig Start Date End Date Taking? Authorizing Provider  morphine (MS CONTIN) 60 MG 12 hr tablet Take 60 mg by mouth 2 (two) times daily.   Yes Historical Provider, MD  morphine (MSIR) 30 MG tablet Take 30 mg by mouth every 4 (four) hours as needed. Breakthrough pain   Yes Historical Provider, MD  promethazine (PHENERGAN) 25 MG tablet Take 25 mg by mouth every 6 (six) hours as needed.   Yes Historical Provider, MD    Allergies:  No Known Allergies  Social History:  reports that he has been smoking.  He does not have any smokeless tobacco history on file. He reports that he drinks alcohol. He reports that he  does not use illicit drugs.  Family History: History reviewed. No pertinent family history.  Physical Exam: Filed Vitals:   05/12/11 2035 05/13/11 0043 05/13/11 0222  BP: 138/74 133/89 117/68  Pulse: 77 55 54  Temp: 98.6 F (37 C) 98.3 F (36.8 C)   TempSrc: Oral Oral   Resp: 20 16   SpO2: 100% 95% 97%    General:  Alert and oriented times three, well developed and nourished, no acute distress Eyes: PERRLA, pink conjunctiva, no scleral icterus ENT: Moist oral mucosa, neck supple, no thyromegaly Lungs: clear to ascultation, no wheeze, no crackles, no use of accessory muscles Cardiovascular: regular rate and rhythm, no regurgitation, no gallops, no murmurs. No carotid bruits, no JVD Abdomen: soft, positive BS, non-tender, non-distended, no organomegaly, not an acute abdomen GU: not examined Neuro: CN II - XII grossly intact, sensation intact Musculoskeletal: strength 5/5 all extremities, no clubbing, cyanosis or edema Skin: no rash, no subcutaneous crepitation, no decubitus Psych: appropriate patient   Labs on Admission:   Basename 05/13/11 0020  NA 137  K 3.8  CL 102  CO2 25  GLUCOSE 80  BUN 11  CREATININE 0.82  CALCIUM 9.4  MG --  PHOS --   No results found for this basename: AST:2,ALT:2,ALKPHOS:2,BILITOT:2,PROT:2,ALBUMIN:2 in the last 72 hours No results found for this basename: LIPASE:2,AMYLASE:2 in the last 72 hours  Basename 05/13/11 0020  WBC 10.6*  NEUTROABS --  HGB 12.1*  HCT 32.7*  MCV 90.3  PLT 304   No results found for this basename: CKTOTAL:3,CKMB:3,CKMBINDEX:3,TROPONINI:3 in the last 72 hours  No components found with this basename: POCBNP:3 No results found for this basename: DDIMER:2 in the last 72 hours No results found for this basename: HGBA1C:2 in the last 72 hours No results found for this basename: CHOL:2,HDL:2,LDLCALC:2,TRIG:2,CHOLHDL:2,LDLDIRECT:2 in the last 72 hours No results found for this basename:  TSH,T4TOTAL,FREET3,T3FREE,THYROIDAB in the last 72 hours No results found for this basename: VITAMINB12:2,FOLATE:2,FERRITIN:2,TIBC:2,IRON:2,RETICCTPCT:2 in the last 72 hours  Micro Results: No results found for this or any previous visit (from the past 240 hour(s)).   Radiological Exams on Admission: Dg Chest Portable 1 View  05/13/2011  *RADIOLOGY REPORT*  Clinical Data: Chest pain  PORTABLE CHEST - 1 VIEW  Comparison: 01/05/2011  Findings: Prompt heart size and vascular congestion.  Scattered areas of parenchymal scarring/atelectasis.  Minimal residual streaky basilar opacities.  No effusion or pneumothorax.  Trachea midline.  Slight curvature of the spine may be positional.  IMPRESSION: Mild cardiac enlargement with vascular congestion.  Residual perihilar and basilar scarring/atelectasis.  Improving basilar aeration.  Original Report Authenticated By: Judie Petit. Ruel Favors, M.D.    Assessment/Plan Present on Admission:  .Sickle cell anemia with crisis Admit to MedSurg  IV fluids and pain medications as needed   Full code  DVT prophylaxis Team 1/Dr. Cyd Silence, Vonetta Foulk 05/13/2011, 3:33 AM

## 2011-05-13 NOTE — Progress Notes (Signed)
I have seen and examined pt, still with back and chest pain requiring IV analgesics, will continue current management plan as per  Dr Joneen Roach- follow and further recs pending evolution of clinical course.

## 2011-05-13 NOTE — ED Notes (Signed)
Pt has HX sickle cell. Onset of pain approx four days ago. Pt states pain is typical of past crisis.

## 2011-05-13 NOTE — ED Provider Notes (Signed)
History     CSN: [id number]  Arrival date & time 05/12/11  2018   First MD Initiated Contact with Patient 05/12/11 2256      Chief Complaint  Patient presents with  . Sickle Cell Pain Crisis    pain to back x 5 days.    (Consider location/radiation/quality/duration/timing/severity/associated sxs/prior treatment) Patient is a 36 y.o. male presenting with sickle cell pain. The history is provided by the patient.  Sickle Cell Pain Crisis  This is a new problem. The current episode started 2 days ago. The problem occurs continuously. The problem has been gradually worsening. Associated with: Typical sickle cell pain sinus chest and back. The pain is present in the midline region. Site of pain is localized in bone. The pain is similar to prior episodes. The pain is severe. The symptoms are relieved by nothing. Associated symptoms include chest pain and back pain. Pertinent negatives include no photophobia, no abdominal pain, no diarrhea, no nausea, no vomiting, no dysuria, no headaches, no sore throat, no neck pain, no loss of sensation, no tingling, no weakness, no cough, no difficulty breathing, no rash and no eye pain. There is no swelling present. He has been behaving normally. He has been eating and drinking normally. There were no sick contacts. He has received no recent medical care.   patient denies any fevers, chills or recent illness otherwise. He has been taking his medications as prescribed with worsening symptoms typical for his sickle cell crisis. Pain is sharp in quality and not radiating. Patient feels like he needs to be admitted to the hospital.  Past Medical History  Diagnosis Date  . Sickle cell crisis     History reviewed. No pertinent past surgical history.  History reviewed. No pertinent family history.  History  Substance Use Topics  . Smoking status: Current Everyday Smoker  . Smokeless tobacco: Not on file  . Alcohol Use: Yes      Review of Systems    Constitutional: Negative for fever and chills.  HENT: Negative for sore throat, neck pain and neck stiffness.   Eyes: Negative for photophobia and pain.  Respiratory: Negative for cough and shortness of breath.   Cardiovascular: Positive for chest pain.  Gastrointestinal: Negative for nausea, vomiting, abdominal pain and diarrhea.  Genitourinary: Negative for dysuria.  Musculoskeletal: Positive for back pain.  Skin: Negative for rash.  Neurological: Negative for tingling, weakness and headaches.  All other systems reviewed and are negative.    Allergies  Review of patient's allergies indicates no known allergies.  Home Medications   Current Outpatient Rx  Name Route Sig Dispense Refill  . MORPHINE SULFATE ER 60 MG PO TB12 Oral Take 60 mg by mouth 2 (two) times daily.    . MORPHINE SULFATE 30 MG PO TABS Oral Take 30 mg by mouth every 4 (four) hours as needed. Breakthrough pain    . PROMETHAZINE HCL 25 MG PO TABS Oral Take 25 mg by mouth every 6 (six) hours as needed.      BP 117/68  Pulse 54  Temp(Src) 98.3 F (36.8 C) (Oral)  Resp 16  SpO2 97%  Physical Exam  Constitutional: He is oriented to person, place, and time. He appears well-developed and well-nourished.  HENT:  Head: Normocephalic and atraumatic.  Eyes: Conjunctivae and EOM are normal. Pupils are equal, round, and reactive to light.  Neck: Trachea normal. Neck supple. No thyromegaly present.  Cardiovascular: Normal rate, regular rhythm, S1 normal, S2 normal and normal pulses.  No systolic murmur is present   No diastolic murmur is present  Pulses:      Radial pulses are 2+ on the right side, and 2+ on the left side.  Pulmonary/Chest: Effort normal and breath sounds normal. He has no wheezes. He has no rhonchi. He has no rales. He exhibits no tenderness.  Abdominal: Soft. Normal appearance and bowel sounds are normal. There is no tenderness. There is no CVA tenderness and negative Murphy's sign.   Musculoskeletal:       BLE:s Calves nontender, no cords or erythema, negative Homans sign  Neurological: He is alert and oriented to person, place, and time. He has normal strength. No cranial nerve deficit or sensory deficit. GCS eye subscore is 4. GCS verbal subscore is 5. GCS motor subscore is 6.  Skin: Skin is warm and dry. No rash noted. He is not diaphoretic.  Psychiatric: His speech is normal.       Cooperative and appropriate    ED Course  Procedures (including critical care time)  Labs Reviewed  CBC - Abnormal; Notable for the following:    WBC 10.6 (*)    RBC 3.62 (*)    Hemoglobin 12.1 (*)    HCT 32.7 (*)    MCHC 37.0 (*)    All other components within normal limits  BASIC METABOLIC PANEL   Dg Chest Portable 1 View  05/13/2011  *RADIOLOGY REPORT*  Clinical Data: Chest pain  PORTABLE CHEST - 1 VIEW  Comparison: 01/05/2011  Findings: Prompt heart size and vascular congestion.  Scattered areas of parenchymal scarring/atelectasis.  Minimal residual streaky basilar opacities.  No effusion or pneumothorax.  Trachea midline.  Slight curvature of the spine may be positional.  IMPRESSION: Mild cardiac enlargement with vascular congestion.  Residual perihilar and basilar scarring/atelectasis.  Improving basilar aeration.  Original Report Authenticated By: Judie Petit. Ruel Favors, M.D.     1. Sickle cell crisis    IV fluids, IV narcotics, oxygen provided. Serial evaluations with minimal improvement in pain. Chest x-ray labs reviewed as above. Medicine consult for admit acute sickle cell crisis. Case discussed as above with [facility]ad hospitalist who agrees to ED evaluation and admission.   MDM  Acute sickle cell crisis        Sunnie Nielsen, MD 05/13/11 [phone]

## 2011-05-14 DIAGNOSIS — K5900 Constipation, unspecified: Secondary | ICD-10-CM | POA: Diagnosis present

## 2011-05-14 LAB — CBC
HCT: 31 % — ABNORMAL LOW (ref 39.0–52.0)
Hemoglobin: 11.6 g/dL — ABNORMAL LOW (ref 13.0–17.0)
MCH: 33.8 pg (ref 26.0–34.0)
MCH: 32.8 pg (ref 26.0–34.0)
MCHC: 37.4 g/dL — ABNORMAL HIGH (ref 30.0–36.0)
MCHC: 36.1 g/dL — ABNORMAL HIGH (ref 30.0–36.0)
MCV: 90.4 fL (ref 78.0–100.0)
MCV: 91 fL (ref 78.0–100.0)
Platelets: 276 10*3/uL (ref 150–400)
RBC: 3.44 MIL/uL — ABNORMAL LOW (ref 4.22–5.81)
RDW: 15 % (ref 11.5–15.5)

## 2011-05-14 LAB — BASIC METABOLIC PANEL
BUN: 9 mg/dL (ref 6–23)
CO2: 24 mEq/L (ref 19–32)
Chloride: 103 mEq/L (ref 96–112)
Creatinine, Ser: 0.81 mg/dL (ref 0.50–1.35)
Glucose, Bld: 89 mg/dL (ref 70–99)
Potassium: 4.5 mEq/L (ref 3.5–5.1)

## 2011-05-14 MED ORDER — HYDROMORPHONE HCL PF 2 MG/ML IJ SOLN
2.0000 mg | INTRAMUSCULAR | Status: DC | PRN
  Administered 2011-05-14 – 2011-05-15 (×12): 2 mg via INTRAVENOUS
  Filled 2011-05-14 (×12): qty 1

## 2011-05-14 MED ORDER — SORBITOL 70 % SOLN
30.0000 mL | Freq: Every day | Status: DC | PRN
  Administered 2011-05-17 – 2011-05-21 (×4): 30 mL via ORAL
  Filled 2011-05-14 (×4): qty 30

## 2011-05-14 MED ORDER — DM-GUAIFENESIN ER 30-600 MG PO TB12
1.0000 | ORAL_TABLET | Freq: Two times a day (BID) | ORAL | Status: DC
  Administered 2011-05-14 – 2011-05-18 (×8): 1 via ORAL
  Filled 2011-05-14 (×9): qty 1

## 2011-05-14 NOTE — Progress Notes (Signed)
PATIENT DETAILS Name: [name] Age: 36 y.o. Sex: male Date of Birth: 1975/08/23 Admit Date: 05/12/2011 OZH:YQMVHQI,ONGEX A, MD, MD Emergency contact: Jorah, Hua (Father) [phone]   CONSULTS: None  Interval History: [name] is a 36 year old male with known history of sickle cell disease, who states he has had chest and back pain for 4-5 days prior to admission. He states he's been taking his home by mouth medications without any improvement, therefore decided to come to the ER patient on 05/13/2011. He has been receiving fairly frequent doses of Dilaudid-HP since admission for pain control.   ROS: [name] currently continues to complain of neck pain, hip pain, and back pain. He is intermittently nauseated. He complains of constipation. He complains of inadequate pain relief with every 4 hour dosing of his pain medication.   Objective: Vital signs in last 24 hours: Temp:  [97.7 F (36.5 C)-98.7 F (37.1 C)] 98.7 F (37.1 C) (01/17 1118) Pulse Rate:  [53-78] 78  (01/17 1118) Resp:  [16-20] 18  (01/17 1118) BP: (110-144)/(72-90) 144/83 mmHg (01/17 1118) SpO2:  [95 %-99 %] 95 % (01/17 1118) Weight:  [87.998 kg (194 lb)-88.083 kg (194 lb 3 oz)] 87.998 kg (194 lb) (01/17 0511) Weight change:     Intake/Output from previous day:  Intake/Output Summary (Last 24 hours) at 05/14/11 1444 Last data filed at 05/14/11 1120  Gross per 24 hour  Intake    240 ml  Output   3900 ml  Net  -3660 ml     Physical Exam:  Gen:  NAD Cardiovascular:  RRR, No M/R/G Respiratory: Lungs CTAB Gastrointestinal: Abdomen soft, NT/ND with normal active bowel sounds. Extremities: No C/E/C     Lab Results: Basic Metabolic Panel:  Lab 05/14/11 1027 05/13/11 0020  NA 137 137  K 4.5 3.8  CL 103 102  CO2 24 25  GLUCOSE 89 80  BUN 9 11  CREATININE 0.81 0.82  CALCIUM 9.2 9.4  MG -- --  PHOS -- --   GFR Estimated Creatinine Clearance: 148 ml/min (by C-G formula based on Cr of  0.81).  CBC:  Lab 05/14/11 0325 05/14/11 0045 05/13/11 0020  WBC 10.0 10.9* 10.6*  NEUTROABS -- -- --  HGB 11.3* 11.6* 12.1*  HCT 31.3* 31.0* 32.7*  MCV 91.0 90.4 90.3  PLT 276 237 304    Studies/Results: Dg Chest Portable 1 View  05/13/2011  *RADIOLOGY REPORT*  Clinical Data: Chest pain  PORTABLE CHEST - 1 VIEW  Comparison: 01/05/2011  Findings: Prompt heart size and vascular congestion.  Scattered areas of parenchymal scarring/atelectasis.  Minimal residual streaky basilar opacities.  No effusion or pneumothorax.  Trachea midline.  Slight curvature of the spine may be positional.  IMPRESSION: Mild cardiac enlargement with vascular congestion.  Residual perihilar and basilar scarring/atelectasis.  Improving basilar aeration.  Original Report Authenticated By: Judie Petit. Ruel Favors, M.D.    Medications: Scheduled Meds:    . dextromethorphan-guaiFENesin  1 tablet Oral BID  . enoxaparin  40 mg Subcutaneous Q24H  . folic acid  1 mg Oral Daily  . morphine  60 mg Oral BID  . nicotine  7 mg Transdermal Daily   Continuous Infusions:    . 0.9 % NaCl with KCl 20 mEq / L 100 mL/hr at 05/14/11 0905   PRN Meds:.HYDROmorphone (DILAUDID) injection, morphine, promethazine, sorbitol, DISCONTD:  HYDROmorphone (DILAUDID) injection Antibiotics: Anti-infectives    None       Assessment/Plan:  Principal Problem:  *Sickle cell anemia with crisis We'll  continue to receive IV fluids and when necessary Dilaudid-HP. I then increased the frequency of how often he receives his Dilaudid-HP from every 4 hours to every 2 hours. I have referred him to Dr. Diamantina [facility] sickle cell service and Dr. August Saucer will pick him up as a patient in the morning.  Continue K-pad for symptomatic treatment. Active Problems:  Constipation I have added sorbitol when necessary for constipation.    LOS: 2 days   Hillery Aldo, MD Pager [phone]  05/14/2011, 2:44 PM

## 2011-05-14 NOTE — Progress Notes (Signed)
CARE MANAGEMENT NOTE 05/14/2011  Patient:  [name], [name]   Account Number:  [id number]  Date Initiated:  05/14/2011  Documentation initiated by:  PEARSON,COOKIE  Subjective/Objective Assessment:   pt admitted with cco chest and back pain with sickle cell     Action/Plan:   plan to dc home   Anticipated DC Date:  05/16/2011   Anticipated DC Plan:  HOME/SELF CARE         Choice offered to / List presented to:             Status of service:  In process, will continue to follow Medicare Important Message given?  NO (If response is "NO", the following Medicare IM given date fields will be blank) Date Medicare IM given:   Date Additional Medicare IM given:    Discharge Disposition:  HOME/SELF CARE  Per UR Regulation:  Reviewed for med. necessity/level of care/duration of stay  Comments:  05/14/11 MPearson, RN, BSN Pt from home. Chart reviewed. Will continue to follow for discharge needs.

## 2011-05-15 ENCOUNTER — Inpatient Hospital Stay (HOSPITAL_COMMUNITY): Payer: Medicare Other

## 2011-05-15 DIAGNOSIS — Z720 Tobacco use: Secondary | ICD-10-CM | POA: Diagnosis present

## 2011-05-15 DIAGNOSIS — J40 Bronchitis, not specified as acute or chronic: Secondary | ICD-10-CM | POA: Diagnosis present

## 2011-05-15 DIAGNOSIS — D5701 Hb-SS disease with acute chest syndrome: Secondary | ICD-10-CM | POA: Diagnosis present

## 2011-05-15 LAB — TYPE AND SCREEN
Antibody Screen: NEGATIVE
Unit division: 0

## 2011-05-15 LAB — CBC
HCT: 30.1 % — ABNORMAL LOW (ref 39.0–52.0)
MCHC: 36.5 g/dL — ABNORMAL HIGH (ref 30.0–36.0)
Platelets: 270 10*3/uL (ref 150–400)
RDW: 15 % (ref 11.5–15.5)

## 2011-05-15 LAB — FERRITIN: Ferritin: 879 ng/mL — ABNORMAL HIGH (ref 22–322)

## 2011-05-15 LAB — RETICULOCYTES: RBC.: 3.33 MIL/uL — ABNORMAL LOW (ref 4.22–5.81)

## 2011-05-15 MED ORDER — DOCUSATE SODIUM 100 MG PO CAPS
100.0000 mg | ORAL_CAPSULE | Freq: Two times a day (BID) | ORAL | Status: DC
  Administered 2011-05-15 – 2011-05-22 (×14): 100 mg via ORAL
  Filled 2011-05-15 (×15): qty 1

## 2011-05-15 MED ORDER — PANTOPRAZOLE SODIUM 40 MG PO TBEC
40.0000 mg | DELAYED_RELEASE_TABLET | Freq: Every day | ORAL | Status: DC
  Administered 2011-05-16 – 2011-05-22 (×7): 40 mg via ORAL
  Filled 2011-05-15 (×9): qty 1

## 2011-05-15 MED ORDER — LIDOCAINE 5 % EX PTCH
1.0000 | MEDICATED_PATCH | CUTANEOUS | Status: DC
  Administered 2011-05-15 – 2011-05-21 (×7): 1 via TRANSDERMAL
  Filled 2011-05-15 (×8): qty 1

## 2011-05-15 MED ORDER — DEXTROSE-NACL 5-0.45 % IV SOLN
INTRAVENOUS | Status: DC
Start: 2011-05-15 — End: 2011-05-22
  Administered 2011-05-15 – 2011-05-19 (×7): via INTRAVENOUS
  Administered 2011-05-19: 100 mL/h via INTRAVENOUS
  Administered 2011-05-20 – 2011-05-22 (×5): via INTRAVENOUS

## 2011-05-15 MED ORDER — HYDROMORPHONE HCL PF 2 MG/ML IJ SOLN
2.0000 mg | INTRAMUSCULAR | Status: DC
  Administered 2011-05-15 (×3): 2 mg via INTRAVENOUS
  Administered 2011-05-15: 4 mg via INTRAVENOUS
  Administered 2011-05-15 – 2011-05-16 (×2): 2 mg via INTRAVENOUS
  Administered 2011-05-16 (×2): 4 mg via INTRAVENOUS
  Administered 2011-05-16: 2 mg via INTRAVENOUS
  Administered 2011-05-16: 4 mg via INTRAVENOUS
  Administered 2011-05-16 (×2): 2 mg via INTRAVENOUS
  Administered 2011-05-16: 4 mg via INTRAVENOUS
  Administered 2011-05-16 – 2011-05-17 (×4): 2 mg via INTRAVENOUS
  Administered 2011-05-17: 4 mg via INTRAVENOUS
  Administered 2011-05-17 (×4): 2 mg via INTRAVENOUS
  Administered 2011-05-17 – 2011-05-18 (×6): 4 mg via INTRAVENOUS
  Administered 2011-05-18: 2 mg via INTRAVENOUS
  Administered 2011-05-18: 4 mg via INTRAVENOUS
  Administered 2011-05-18 (×3): 2 mg via INTRAVENOUS
  Administered 2011-05-18 (×2): 4 mg via INTRAVENOUS
  Administered 2011-05-18 (×3): 2 mg via INTRAVENOUS
  Administered 2011-05-19: 4 mg via INTRAVENOUS
  Administered 2011-05-19 (×2): 2 mg via INTRAVENOUS
  Administered 2011-05-19: 4 mg via INTRAVENOUS
  Administered 2011-05-19 (×5): 2 mg via INTRAVENOUS
  Administered 2011-05-19 (×3): 4 mg via INTRAVENOUS
  Administered 2011-05-20 (×3): 2 mg via INTRAVENOUS
  Administered 2011-05-20: 4 mg via INTRAVENOUS
  Administered 2011-05-20 (×4): 2 mg via INTRAVENOUS
  Administered 2011-05-20: 4 mg via INTRAVENOUS
  Administered 2011-05-20: 2 mg via INTRAVENOUS
  Administered 2011-05-20: 4 mg via INTRAVENOUS
  Administered 2011-05-20: 2 mg via INTRAVENOUS
  Administered 2011-05-21 (×2): 4 mg via INTRAVENOUS
  Administered 2011-05-21: 2 mg via INTRAVENOUS
  Administered 2011-05-21: 4 mg via INTRAVENOUS
  Administered 2011-05-21: 2 mg via INTRAVENOUS
  Administered 2011-05-21: 4 mg via INTRAVENOUS
  Administered 2011-05-21 (×2): 2 mg via INTRAVENOUS
  Administered 2011-05-21: 4 mg via INTRAVENOUS
  Administered 2011-05-21: 2 mg via INTRAVENOUS
  Administered 2011-05-21: 4 mg via INTRAVENOUS
  Administered 2011-05-22 (×4): 2 mg via INTRAVENOUS
  Administered 2011-05-22 (×2): 4 mg via INTRAVENOUS
  Administered 2011-05-22: 2 mg via INTRAVENOUS
  Filled 2011-05-15 (×8): qty 2
  Filled 2011-05-15: qty 1
  Filled 2011-05-15: qty 2
  Filled 2011-05-15: qty 1
  Filled 2011-05-15 (×2): qty 2
  Filled 2011-05-15 (×5): qty 1
  Filled 2011-05-15: qty 2
  Filled 2011-05-15 (×3): qty 1
  Filled 2011-05-15: qty 2
  Filled 2011-05-15 (×4): qty 1
  Filled 2011-05-15: qty 2
  Filled 2011-05-15 (×3): qty 1
  Filled 2011-05-15 (×2): qty 2
  Filled 2011-05-15 (×9): qty 1
  Filled 2011-05-15: qty 2
  Filled 2011-05-15 (×4): qty 1
  Filled 2011-05-15 (×2): qty 2
  Filled 2011-05-15: qty 1
  Filled 2011-05-15 (×2): qty 2
  Filled 2011-05-15 (×2): qty 1
  Filled 2011-05-15: qty 2
  Filled 2011-05-15 (×5): qty 1
  Filled 2011-05-15: qty 2
  Filled 2011-05-15 (×3): qty 1
  Filled 2011-05-15 (×3): qty 2
  Filled 2011-05-15: qty 1
  Filled 2011-05-15: qty 2
  Filled 2011-05-15 (×2): qty 1
  Filled 2011-05-15: qty 2
  Filled 2011-05-15: qty 1
  Filled 2011-05-15: qty 2
  Filled 2011-05-15: qty 1
  Filled 2011-05-15: qty 2
  Filled 2011-05-15: qty 1
  Filled 2011-05-15: qty 2
  Filled 2011-05-15 (×4): qty 1

## 2011-05-15 MED ORDER — DIPHENHYDRAMINE HCL 50 MG/ML IJ SOLN
12.5000 mg | INTRAMUSCULAR | Status: DC
  Administered 2011-05-15 – 2011-05-17 (×2): 25 mg via INTRAVENOUS
  Filled 2011-05-15 (×21): qty 0.5
  Filled 2011-05-15: qty 1
  Filled 2011-05-15 (×12): qty 0.5
  Filled 2011-05-15: qty 1
  Filled 2011-05-15 (×9): qty 0.5

## 2011-05-15 MED ORDER — HYDROXYUREA 500 MG PO CAPS
500.0000 mg | ORAL_CAPSULE | Freq: Two times a day (BID) | ORAL | Status: DC
  Administered 2011-05-15 – 2011-05-22 (×14): 500 mg via ORAL
  Filled 2011-05-15 (×15): qty 1

## 2011-05-15 MED ORDER — PROMETHAZINE HCL 25 MG PO TABS
25.0000 mg | ORAL_TABLET | ORAL | Status: DC | PRN
  Administered 2011-05-15 – 2011-05-21 (×5): 25 mg via ORAL
  Filled 2011-05-15 (×6): qty 1

## 2011-05-15 MED ORDER — PROMETHAZINE HCL 25 MG/ML IJ SOLN
12.5000 mg | INTRAMUSCULAR | Status: DC | PRN
  Administered 2011-05-15: 25 mg via INTRAVENOUS
  Administered 2011-05-16 (×2): 12.5 mg via INTRAVENOUS
  Administered 2011-05-17: 25 mg via INTRAVENOUS
  Administered 2011-05-17 – 2011-05-18 (×3): 12.5 mg via INTRAVENOUS
  Administered 2011-05-20: 25 mg via INTRAVENOUS
  Filled 2011-05-15 (×7): qty 1

## 2011-05-15 MED ORDER — ONDANSETRON HCL 4 MG PO TABS: 4.0000 mg | ORAL_TABLET | ORAL | Status: DC | PRN

## 2011-05-15 MED ORDER — MOXIFLOXACIN HCL IN NACL 400 MG/250ML IV SOLN
400.0000 mg | INTRAVENOUS | Status: DC
  Administered 2011-05-15 – 2011-05-17 (×3): 400 mg via INTRAVENOUS
  Filled 2011-05-15 (×4): qty 250

## 2011-05-15 MED ORDER — IBUPROFEN 800 MG PO TABS
800.0000 mg | ORAL_TABLET | Freq: Three times a day (TID) | ORAL | Status: AC
  Administered 2011-05-15 – 2011-05-18 (×9): 800 mg via ORAL
  Filled 2011-05-15 (×14): qty 1

## 2011-05-15 MED ORDER — LEVALBUTEROL HCL 0.63 MG/3ML IN NEBU
0.6300 mg | INHALATION_SOLUTION | Freq: Four times a day (QID) | RESPIRATORY_TRACT | Status: AC
  Administered 2011-05-15 – 2011-05-18 (×8): 0.63 mg via RESPIRATORY_TRACT
  Filled 2011-05-15 (×12): qty 3

## 2011-05-15 MED ORDER — ONDANSETRON HCL 4 MG/2ML IJ SOLN: 4.0000 mg | INTRAMUSCULAR | Status: DC | PRN

## 2011-05-15 MED ORDER — DIPHENHYDRAMINE HCL 25 MG PO CAPS
25.0000 mg | ORAL_CAPSULE | ORAL | Status: DC
  Administered 2011-05-16 – 2011-05-22 (×34): 25 mg via ORAL
  Filled 2011-05-15: qty 2
  Filled 2011-05-15: qty 1
  Filled 2011-05-15 (×2): qty 2
  Filled 2011-05-15: qty 1
  Filled 2011-05-15 (×3): qty 2
  Filled 2011-05-15: qty 1
  Filled 2011-05-15: qty 2
  Filled 2011-05-15: qty 1
  Filled 2011-05-15: qty 2
  Filled 2011-05-15 (×2): qty 1
  Filled 2011-05-15: qty 2
  Filled 2011-05-15: qty 1
  Filled 2011-05-15 (×4): qty 2
  Filled 2011-05-15: qty 1
  Filled 2011-05-15 (×4): qty 2
  Filled 2011-05-15 (×2): qty 1
  Filled 2011-05-15: qty 2
  Filled 2011-05-15 (×2): qty 1
  Filled 2011-05-15 (×8): qty 2
  Filled 2011-05-15 (×2): qty 1
  Filled 2011-05-15: qty 2
  Filled 2011-05-15: qty 1
  Filled 2011-05-15: qty 2
  Filled 2011-05-15 (×3): qty 1
  Filled 2011-05-15 (×2): qty 2
  Filled 2011-05-15 (×2): qty 1
  Filled 2011-05-15 (×3): qty 2
  Filled 2011-05-15: qty 1
  Filled 2011-05-15 (×4): qty 2
  Filled 2011-05-15: qty 1
  Filled 2011-05-15 (×2): qty 2
  Filled 2011-05-15 (×2): qty 1
  Filled 2011-05-15 (×3): qty 2

## 2011-05-15 NOTE — Progress Notes (Signed)
Subjective: The patient was seen on rounds today.   Initially the patient was ambulating in the hallway, then the patient was sitting on the bed quietly in his room and being visited by his father.  The patient is complaining of pain 9/10 in his chest, back and left hip.  The patient has a previous right hip replacement and states that his left hip pain is very severe.  The patient is also has a productive cough with green sputum.  The patient is in visible distress.  The patient's nurse noted that the patient does not have much of an appetite.  Explained explicitly to the patient and his father about the plan of care including pain management, IV hydration, PICC line insertion, blood exchange and IS usage.  The patient does not want a PCA for pain management.  No other patient, family or nursing concerns.   Objective: Vital signs in last 24 hours: Blood pressure 145/95, pulse 91, temperature 99.1 F (37.3 C), temperature source Oral, resp. rate 18, height 6\' 2"  (1.88 m), weight 194 lb (87.998 kg), SpO2 93.00%.  General Appearance: Alert, cooperative, well developed, severe distress Head: Normocephalic, without obvious abnormality, atraumatic Eyes: PERRLA, EOMI, scleral icterus Nose: Nares, septum and mucosa are normal, no drainage or sinus tenderness Throat: Lips, mucosa, and tongue normal; teeth and gums normal Neck: No adenopathy, supple, symmetrical, trachea midline,thyroid not enlarged, symmetric, no tenderness/mass/nodules Back: Symmetric, ROM normal, no CVA tenderness Resp: Diminished breath sounds bibasilar and bilaterally Cardio: Regular rate and rhythm, S1, S2 normal, no murmur, click, rub or gallop GI: Soft, non-tender, non-distended, hypoactive bowel sounds, no masses, no organomegaly Male Genitalia: Deferred, the patient denies priapism Extremities: Normal, atraumatic, no cyanosis or edema and Homans sign is negative, no sign of DVT, limited ROM bilateral LEs Pulses: 2+ and  symmetric Skin: Skin color, texture, turgor normal, No rashes or lesions, LLE hyperpigmentation, well healed scars on trunk and extremities  Neurologic: Grossly normal, AO x 3, CN II - XII grossly intact, no focal deficits Psych:  Appropriate affect, visible distress due to pain  Lab Results: Results for orders placed during the hospital encounter of 05/12/11 (from the past 48 hour(s))  BASIC METABOLIC PANEL     Status: Normal   Collection Time   05/14/11 12:45 AM      Component Value Range Comment   Sodium 137  135 - 145 (mEq/L)    Potassium 4.5  3.5 - 5.1 (mEq/L)    Chloride 103  96 - 112 (mEq/L)    CO2 24  19 - 32 (mEq/L)    Glucose, Bld 89  70 - 99 (mg/dL)    BUN 9  6 - 23 (mg/dL)    Creatinine, Ser 7.84  0.50 - 1.35 (mg/dL)    Calcium 9.2  8.4 - 10.5 (mg/dL)    GFR calc non Af Amer >90  >90 (mL/min)    GFR calc Af Amer >90  >90 (mL/min)   CBC     Status: Abnormal   Collection Time   05/14/11 12:45 AM      Component Value Range Comment   WBC 10.9 (*) 4.0 - 10.5 (K/uL)    RBC 3.43 (*) 4.22 - 5.81 (MIL/uL)    Hemoglobin 11.6 (*) 13.0 - 17.0 (g/dL)    HCT 69.6 (*) 29.5 - 52.0 (%)    MCV 90.4  78.0 - 100.0 (fL)    MCH 33.8  26.0 - 34.0 (pg)    MCHC 37.4 (*) 30.0 -  36.0 (g/dL)    RDW 21.3  08.6 - 57.8 (%)    Platelets 237  150 - 400 (K/uL)   CBC     Status: Abnormal   Collection Time   05/14/11  3:25 AM      Component Value Range Comment   WBC 10.0  4.0 - 10.5 (K/uL)    RBC 3.44 (*) 4.22 - 5.81 (MIL/uL)    Hemoglobin 11.3 (*) 13.0 - 17.0 (g/dL)    HCT 46.9 (*) 62.9 - 52.0 (%)    MCV 91.0  78.0 - 100.0 (fL)    MCH 32.8  26.0 - 34.0 (pg)    MCHC 36.1 (*) 30.0 - 36.0 (g/dL)    RDW 52.8  41.3 - 24.4 (%)    Platelets 276  150 - 400 (K/uL)   CBC     Status: Abnormal   Collection Time   05/15/11  3:42 AM      Component Value Range Comment   WBC 11.0 (*) 4.0 - 10.5 (K/uL)    RBC 3.33 (*) 4.22 - 5.81 (MIL/uL)    Hemoglobin 11.0 (*) 13.0 - 17.0 (g/dL)    HCT 01.0 (*) 27.2 -  52.0 (%)    MCV 90.4  78.0 - 100.0 (fL)    MCH 33.0  26.0 - 34.0 (pg)    MCHC 36.5 (*) 30.0 - 36.0 (g/dL)    RDW 53.6  64.4 - 03.4 (%)    Platelets 270  150 - 400 (K/uL)   RETICULOCYTES     Status: Abnormal   Collection Time   05/15/11  3:42 AM      Component Value Range Comment   Retic Ct Pct 3.7 (*) 0.4 - 3.1 (%)    RBC. 3.33 (*) 4.22 - 5.81 (MIL/uL)    Retic Count, Manual 123.2  19.0 - 186.0 (K/uL)     Studies/Results: Dg Chest Portable 1 View 05/13/2011  *RADIOLOGY REPORT*  Clinical Data: Chest pain  PORTABLE CHEST - 1 VIEW  Comparison: 01/05/2011  Findings: Prompt heart size and vascular congestion.  Scattered areas of parenchymal scarring/atelectasis.  Minimal residual streaky basilar opacities.  No effusion or pneumothorax.  Trachea midline.  Slight curvature of the spine may be positional.  IMPRESSION: Mild cardiac enlargement with vascular congestion.  Residual perihilar and basilar scarring/atelectasis.  Improving basilar aeration.  Original Report Authenticated By: Judie Petit. Ruel Favors, M.D.    Medications: I have reviewed the patient's current medications.  No Known Allergies   Current Facility-Administered Medications  Medication Dose Route Frequency Provider Last Rate Last Dose  . dextromethorphan-guaiFENesin (MUCINEX DM) 30-600 MG per 12 hr tablet 1 tablet  1 tablet Oral BID Hillery Aldo, MD   1 tablet at 05/15/11 1025  . dextrose 5 %-0.45 % sodium chloride infusion   Intravenous Continuous Larina Bras, NP 100 mL/hr at 05/15/11 1454    . diphenhydrAMINE (BENADRYL) injection 12.5-25 mg  12.5-25 mg Intravenous Q4H Larina Bras, NP       Or  . diphenhydrAMINE (BENADRYL) capsule 25-50 mg  25-50 mg Oral Q4H Larina Bras, NP      . docusate sodium (COLACE) capsule 100 mg  100 mg Oral BID Larina Bras, NP      . enoxaparin (LOVENOX) injection 40 mg  40 mg Subcutaneous Q24H Debby Crosley, MD   40 mg at 05/14/11 1017  . folic acid (FOLVITE) tablet 1 mg  1 mg  Oral Daily Debby Crosley, MD   1 mg at 05/15/11 1025  .  HYDROmorphone (DILAUDID) injection 2-4 mg  2-4 mg Intravenous Q2H Larina Bras, NP   4 mg at 05/15/11 1614  . hydroxyurea (HYDREA) capsule 500 mg  500 mg Oral BID Larina Bras, NP      . ibuprofen (ADVIL,MOTRIN) tablet 800 mg  800 mg Oral TID WC Larina Bras, NP      . levalbuterol (XOPENEX) nebulizer solution 0.63 mg  0.63 mg Nebulization Q6H Larina Bras, NP      . lidocaine (LIDODERM) 5 % 1 patch  1 patch Transdermal Q24H Larina Bras, NP      . morphine (MS CONTIN) 12 hr tablet 60 mg  60 mg Oral BID Debby Crosley, MD   60 mg at 05/15/11 1025  . morphine (MSIR) tablet 30 mg  30 mg Oral Q4H PRN Gery Pray, MD   30 mg at 05/14/11 0905  . moxifloxacin (AVELOX) IVPB 400 mg  400 mg Intravenous Q24H Larina Bras, NP      . nicotine (NICODERM CQ - dosed in mg/24 hr) patch 7 mg  7 mg Transdermal Daily Debby Crosley, MD   7 mg at 05/15/11 1026  . pantoprazole (PROTONIX) EC tablet 40 mg  40 mg Oral Q0600 Larina Bras, NP      . promethazine (PHENERGAN) injection 12.5-25 mg  12.5-25 mg Intravenous Q4H PRN Larina Bras, NP       Or  . promethazine (PHENERGAN) tablet 25 mg  25 mg Oral Q4H PRN Larina Bras, NP      . sorbitol 70 % solution 30 mL  30 mL Oral Daily PRN Hillery Aldo, MD        Assessment/Plan: Patient Active Problem List  Diagnoses  . Sickle cell anemia with crisis  . Constipation  . Bronchitis  . Acute chest syndrome  . Tobacco abuse   Sickle Cell Crisis:  The patient is scheduled to receive a blood exchange after his PICC line is inserted.  The patient will be receiving pain management, IV hydration, Hydroxyurea, pruritis/nausea management, NSAID therapy, DVT/GI prophylaxis.  Hemoglobinopathy and Ferritin pending.  CMP and CBC in the am.  The x-ray of the patient's painful left hip was unremarkable. Acute Chest Syndrome/Bronchitis:  The patient will be receiving IV antibiotic  therapy with Avelox, nebulizer treatments with the flutter valve and Mucinex. Tobacco Abuse:  The patient received smoking cessation counseling.  The patient will receive a nicotine patch daily. Constipation:  The patient will receive Sorbitol PRN and Colace BID  Discussed and agreed upon plan of care with the patient.   The plan of care will be adjusted based on the patient's clinical progress.   Larina Bras, NP-C 05/15/2011, 3:57 PM

## 2011-05-15 NOTE — Progress Notes (Signed)
Clinical Social Worker completed brief psychosocial assessment.  Assessment in [facility] chart.  Joscelin Fray J Trenise Turay MSW, LCSW [phone]  

## 2011-05-16 ENCOUNTER — Inpatient Hospital Stay (HOSPITAL_COMMUNITY): Payer: Medicare Other

## 2011-05-16 LAB — COMPREHENSIVE METABOLIC PANEL
ALT: 13 U/L (ref 0–53)
AST: 31 U/L (ref 0–37)
Albumin: 4 g/dL (ref 3.5–5.2)
Alkaline Phosphatase: 76 U/L (ref 39–117)
Calcium: 8.9 mg/dL (ref 8.4–10.5)
Glucose, Bld: 87 mg/dL (ref 70–99)
Potassium: 4.7 mEq/L (ref 3.5–5.1)
Sodium: 136 mEq/L (ref 135–145)
Total Protein: 7.7 g/dL (ref 6.0–8.3)

## 2011-05-16 LAB — CULTURE, RESPIRATORY W GRAM STAIN: Culture: NORMAL

## 2011-05-16 LAB — CBC
Hemoglobin: 10.6 g/dL — ABNORMAL LOW (ref 13.0–17.0)
MCH: 33.2 pg (ref 26.0–34.0)
MCHC: 37.1 g/dL — ABNORMAL HIGH (ref 30.0–36.0)
Platelets: 235 10*3/uL (ref 150–400)

## 2011-05-16 MED ORDER — SODIUM CHLORIDE 0.9 % IJ SOLN
10.0000 mL | Freq: Two times a day (BID) | INTRAMUSCULAR | Status: DC
  Administered 2011-05-16: 10 mL
  Administered 2011-05-17: 20 mL
  Administered 2011-05-17 – 2011-05-21 (×7): 10 mL

## 2011-05-16 MED ORDER — SODIUM CHLORIDE 0.9 % IJ SOLN
10.0000 mL | INTRAMUSCULAR | Status: DC | PRN
  Administered 2011-05-18 – 2011-05-19 (×3): 10 mL

## 2011-05-16 NOTE — Progress Notes (Signed)
Subjective:  Patient complains of cough with whitish yellow phlegm Also complains of back Side and left hip pain grade 12/03/08 Denies any fever or chills overall feels little better  Objective:  Vital Signs in the last 24 hours: Temp:  [98.1 F (36.7 C)-98.5 F (36.9 C)] 98.1 F (36.7 C) (01/19 0558) Pulse Rate:  [77-98] 77  (01/19 0558) Resp:  [18] 18  (01/19 0558) BP: (127-130)/(66-87) 130/87 mmHg (01/19 0558) SpO2:  [90 %-98 %] 90 % (01/19 0741)  Intake/Output from previous day: 01/18 0701 - 01/19 0700 In: 240 [P.O.:240] Out: 3450 [Urine:3450] Intake/Output from this shift:    Physical Exam: General appearance: alert and cooperative Neck: no carotid bruit, no JVD and supple, symmetrical, trachea midline Lungs: clear to auscultation bilaterally Heart: regular rate and rhythm, S1, S2 normal, no murmur, click, rub or gallop Abdomen: soft, non-tender; bowel sounds normal; no masses,  no organomegaly Extremities: extremities normal, atraumatic, no cyanosis or edema  Lab Results:  Basename 05/16/11 0325 05/15/11 0342  WBC 7.3 11.0*  HGB 10.6* 11.0*  PLT 235 270    Basename 05/16/11 0325 05/14/11 0045  NA 136 137  K 4.7 4.5  CL 102 103  CO2 23 24  GLUCOSE 87 89  BUN 8 9  CREATININE 0.80 0.81   No results found for this basename: TROPONINI:2,CK,MB:2 in the last 72 hours Hepatic Function Panel  Basename 05/16/11 0325  PROT 7.7  ALBUMIN 4.0  AST 31  ALT 13  ALKPHOS 76  BILITOT 2.6*  BILIDIR --  IBILI --   No results found for this basename: CHOL in the last 72 hours No results found for this basename: PROTIME in the last 72 hours  Imaging: Dg Hip 1 View Left  05/15/2011  *RADIOLOGY REPORT*  Clinical Data: Severe left hip pain, history of AVN  LEFT HIP - 1 VIEW:  Comparison: 04/23/2006  Findings: Single view of the left hip submitted.  No acute fracture or subluxation.  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  Stable minimal contour irregularity of the left femoral head.  IMPRESSION: No acute fracture or subluxation.  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  Original Report Authenticated By: Natasha Mead, M.D.    Cardiac Studies:  Assessment/Plan:  Sickle cell crisis Resolving bronchitis History of tobacco abuse Chronic pain syndrome Plan Continue present management  LOS: 4 days    Jamian Andujo N 05/16/2011, 1:37 PM

## 2011-05-17 LAB — COMPREHENSIVE METABOLIC PANEL
ALT: 21 U/L (ref 0–53)
BUN: 10 mg/dL (ref 6–23)
CO2: 26 mEq/L (ref 19–32)
Calcium: 9.1 mg/dL (ref 8.4–10.5)
GFR calc Af Amer: >90 mL/min (ref >90)
GFR calc non Af Amer: >90 mL/min (ref >90)
Glucose, Bld: 99 mg/dL (ref 70–99)
Sodium: 138 mEq/L (ref 135–145)
Total Protein: 7.4 g/dL (ref 6.0–8.3)

## 2011-05-17 LAB — CBC
HCT: 27.5 % — ABNORMAL LOW (ref 39.0–52.0)
Hemoglobin: 10.3 g/dL — ABNORMAL LOW (ref 13.0–17.0)
MCH: 32.9 pg (ref 26.0–34.0)
MCHC: 37.5 g/dL — ABNORMAL HIGH (ref 30.0–36.0)
MCV: 87.9 fL (ref 78.0–100.0)
RBC: 3.13 MIL/uL — ABNORMAL LOW (ref 4.22–5.81)

## 2011-05-17 NOTE — Progress Notes (Signed)
Subjective:  Patient complains of cough with yellowish phlegm states overall feels better also complains of left hip and back pain grade 8/10 Denies any fever or chills   Objective:  Vital Signs in the last 24 hours: Temp:  [97 F (36.1 C)-98.6 F (37 C)] 98.2 F (36.8 C) (01/20 1308) Pulse Rate:  [74-102] 74  (01/20 1308) Resp:  [16-20] 18  (01/20 1308) BP: (115-138)/(68-90) 125/75 mmHg (01/20 1308) SpO2:  [84 %-100 %] 90 % (01/20 1405)  Intake/Output from previous day: 01/19 0701 - 01/20 0700 In: 1842.5 [P.O.:480; I.V.:1350; Blood:12.5] Out: 2750 [Urine:2750] Intake/Output from this shift: Total I/O In: 600 [P.O.:600] Out: 1600 [Urine:1600]  Physical Exam: Neck: no adenopathy, no carotid bruit, no JVD and supple, symmetrical, trachea midline Lungs: Decreased breath sound at bases no rhonchi or rales Heart: regular rate and rhythm, S1, S2 normal, no murmur, click, rub or gallop Abdomen: soft, non-tender; bowel sounds normal; no masses,  no organomegaly Extremities: extremities normal, atraumatic, no cyanosis or edema  Lab Results:  Basename 05/17/11 0625 05/16/11 0325  WBC 13.2* 7.3  HGB 10.3* 10.6*  PLT 233 235    Basename 05/17/11 0625 05/16/11 0325  NA 138 136  K 4.2 4.7  CL 103 102  CO2 26 23  GLUCOSE 99 87  BUN 10 8  CREATININE 0.83 0.80   No results found for this basename: TROPONINI:2,CK,MB:2 in the last 72 hours Hepatic Function Panel  Basename 05/17/11 0625  PROT 7.4  ALBUMIN 4.1  AST 33  ALT 21  ALKPHOS 80  BILITOT 3.0*  BILIDIR --  IBILI --   No results found for this basename: CHOL in the last 72 hours No results found for this basename: PROTIME in the last 72 hours  Imaging: Dg Hip 1 View Left  05/15/2011  *RADIOLOGY REPORT*  Clinical Data: Severe left hip pain, history of AVN  LEFT HIP - 1 VIEW:  Comparison: 04/23/2006  Findings: Single view of the left hip submitted.  No acute fracture or subluxation.  Sclerotic serpiginous changes  due to prior infarcts in the left femoral head are again noted.  Stable minimal contour irregularity of the left femoral head.  IMPRESSION: No acute fracture or subluxation.  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  Original Report Authenticated By: Natasha Mead, M.D.   Chest Portable 1 View Post Insertion To Confirm Placement As Interpreted By Radiologist  05/16/2011  *RADIOLOGY REPORT*  Clinical Data: Right-sided PICC line placement  PORTABLE CHEST - 1 VIEW  Comparison: 05/13/2011  Findings: Right-sided PICC line tip terminates over the proximal SVC.  No pneumothorax. Lung volumes are low with crowding of the bronchovascular markings.  Lung volumes are lower than on the prior study with bibasilar atelectasis.  Heart size is upper limits of normal. Aorta is ectatic and unfolded.  IMPRESSION: Right-sided PICC line tip over proximal SVC.  If cavoatrial positioning is desired, this could be advanced 9.5 cm.  Original Report Authenticated By: Harrel Lemon, M.D.    Cardiac Studies:  Assessment/Plan:  Resolving sickle cell crisis Resolving bronchitis Bilateral atelectasis History of tobacco abuse Plan Continue present management  Incentive spirometry  LOS: 5 days    Mya Suell N 05/17/2011, 4:27 PM

## 2011-05-18 LAB — COMPREHENSIVE METABOLIC PANEL
ALT: 24 U/L (ref 0–53)
Albumin: 4.2 g/dL (ref 3.5–5.2)
Alkaline Phosphatase: 88 U/L (ref 39–117)
Chloride: 101 mEq/L (ref 96–112)
Potassium: 4.1 mEq/L (ref 3.5–5.1)
Sodium: 136 mEq/L (ref 135–145)
Total Bilirubin: 2.6 mg/dL — ABNORMAL HIGH (ref 0.3–1.2)
Total Protein: 8.1 g/dL (ref 6.0–8.3)

## 2011-05-18 LAB — CBC
HCT: 27.3 % — ABNORMAL LOW (ref 39.0–52.0)
Hemoglobin: 10.2 g/dL — ABNORMAL LOW (ref 13.0–17.0)
MCHC: 37.4 g/dL — ABNORMAL HIGH (ref 30.0–36.0)
RDW: 17 % — ABNORMAL HIGH (ref 11.5–15.5)
WBC: 12.5 10*3/uL — ABNORMAL HIGH (ref 4.0–10.5)

## 2011-05-18 MED ORDER — MOXIFLOXACIN HCL 400 MG PO TABS
400.0000 mg | ORAL_TABLET | Freq: Every day | ORAL | Status: DC
  Administered 2011-05-18 – 2011-05-21 (×4): 400 mg via ORAL
  Filled 2011-05-18 (×5): qty 1

## 2011-05-18 MED ORDER — LEVALBUTEROL HCL 0.63 MG/3ML IN NEBU
0.6300 mg | INHALATION_SOLUTION | Freq: Four times a day (QID) | RESPIRATORY_TRACT | Status: DC | PRN
  Filled 2011-05-18: qty 3

## 2011-05-18 MED ORDER — GUAIFENESIN-DM 100-10 MG/5ML PO SYRP: 5.0000 mL | ORAL_SOLUTION | Freq: Four times a day (QID) | ORAL | Status: DC

## 2011-05-18 MED ORDER — GUAIFENESIN-DM 100-10 MG/5ML PO SYRP
5.0000 mL | ORAL_SOLUTION | Freq: Four times a day (QID) | ORAL | Status: DC
  Administered 2011-05-18 – 2011-05-22 (×15): 5 mL via ORAL
  Filled 2011-05-18 (×16): qty 10

## 2011-05-18 MED ORDER — KETOROLAC TROMETHAMINE 30 MG/ML IJ SOLN
30.0000 mg | Freq: Four times a day (QID) | INTRAMUSCULAR | Status: AC
  Administered 2011-05-18 – 2011-05-20 (×8): 30 mg via INTRAVENOUS
  Filled 2011-05-18 (×12): qty 1

## 2011-05-18 NOTE — Progress Notes (Signed)
MEDICATION RELATED CONSULT NOTE - FOLLOW UP   Pharmacy Consult for Hydroxyurea Indication: Sickle Cell Anemia  No Known Allergies  Patient Measurements: Height: 6\' 2"  (188 cm) Weight: 175 lb 0.7 oz (79.4 kg) IBW/kg (Calculated) : 82.2   Vital Signs: Temp: 97.7 F (36.5 C) (01/21 0500) Temp src: Oral (01/21 0500) BP: 132/84 mmHg (01/21 0500) Pulse Rate: 75  (01/21 0500) Intake/Output from previous day: 01/20 0701 - 01/21 0700 In: 4980 [P.O.:2580; I.V.:2400] Out: 5700 [Urine:5700] Intake/Output from this shift:    Labs:  Basename 05/18/11 0500 05/17/11 0625 05/16/11 0325 05/15/11 1619  WBC 12.5* 13.2* 7.3 --  HGB 10.2* 10.3* 10.6* --  HCT 27.3* 27.5* 28.6* --  PLT 246 233 235 --  APTT -- -- -- --  CREATININE 0.80 0.83 0.80 --  LABCREA -- -- -- --  CREATININE 0.80 0.83 0.80 --  CREAT24HRUR -- -- -- --  MG -- -- -- 1.9  PHOS -- -- -- 3.7  ALBUMIN 4.2 4.1 4.0 --  PROT 8.1 7.4 7.7 --  ALBUMIN 4.2 4.1 4.0 --  AST 34 33 31 --  ALT 24 21 13  --  ALKPHOS 88 80 76 --  BILITOT 2.6* 3.0* 2.6* --  BILIDIR -- -- -- --  IBILI -- -- -- --   Estimated Creatinine Clearance: 144.7 ml/min (by C-G formula based on Cr of 0.8).   Medications:  Prescriptions prior to admission  Medication Sig Dispense Refill  . morphine (MS CONTIN) 60 MG 12 hr tablet Take 60 mg by mouth 2 (two) times daily.      Marland Kitchen morphine (MSIR) 30 MG tablet Take 30 mg by mouth every 4 (four) hours as needed. Breakthrough pain      . promethazine (PHENERGAN) 25 MG tablet Take 25 mg by mouth every 6 (six) hours as needed.       Scheduled:    . dextromethorphan-guaiFENesin  1 tablet Oral BID  . diphenhydrAMINE  12.5-25 mg Intravenous Q4H   Or  . diphenhydrAMINE  25-50 mg Oral Q4H  . docusate sodium  100 mg Oral BID  . enoxaparin  40 mg Subcutaneous Q24H  . folic acid  1 mg Oral Daily  .  HYDROmorphone (DILAUDID) injection  2-4 mg Intravenous Q2H  . hydroxyurea  500 mg Oral BID  . ibuprofen  800 mg Oral TID  WC  . levalbuterol  0.63 mg Nebulization Q6H  . lidocaine  1 patch Transdermal Q24H  . morphine  60 mg Oral BID  . moxifloxacin  400 mg Intravenous Q24H  . nicotine  7 mg Transdermal Daily  . pantoprazole  40 mg Oral Q0600  . sodium chloride  10-40 mL Intracatheter Q12H    Assessment:  Pharmacy consult for hydroxyurea was ordered - not necessary since pharmacy follows all inpatients receiving hydroxyurea.  Hydroxyurea 500mg  PO BID started this admission. This is close to  Recommended starting dose of 15mg /kg/day.  Consider dose increases every 12 weeks if tolerating.   Labs okay currently.  Note: per [facility] policy, hydroxyurea will be automatically held for the following parameters: ANC < 2, Pltc < 80K, Hgb <or= 6, Reticulocytes < 80K when Hgb < 9.  Plan:  Discontinue consult but cont to follow daily.   [name] 05/18/2011,8:46 AM

## 2011-05-18 NOTE — Progress Notes (Signed)
Subjective: The patient was seen on rounds today.  The patient is sitting on the side of the bed, watching television and visiting with his father.   The patient states his pain is better, but he still has pain rated as 8/10 in his chest (non-cardiac), back and left hip. The patient has a productive cough of green sputum.  The patient states that he has been refusing the Lovenox injections.  Explained to the patient the purpose of the Lovenox injection and the potential outcomes if he does not receive the injection.  The patient stated he understood.  The patient has had a couple of bowel movements.  No other patient, family or nursing concerns.   Objective: Vital signs in last 24 hours: Blood pressure 135/83, pulse 69, temperature 98.5 F (36.9 C), temperature source Oral, resp. rate 20, height 6\' 2"  (1.88 m), weight 175 lb 0.7 oz (79.4 kg), SpO2 99.00%.  General Appearance: Alert, cooperative, well developed, mild distress Head: Normocephalic, without obvious abnormality, atraumatic Eyes: PERRLA, EOMI, scleral icterus Nose: Nares, septum and mucosa are normal, no drainage or sinus tenderness Throat: Lips, mucosa, and tongue normal; teeth and gums normal Neck: No adenopathy, supple, symmetrical, trachea midline,thyroid not enlarged, symmetric, no tenderness/mass/nodules Back: Symmetric, ROM normal, no CVA tenderness Resp: Diminished breath sounds bibasilar and bilaterally Cardio: Regular rate and rhythm, S1, S2 normal, no murmur, click, rub or gallop GI: Soft, non-tender, non-distended, hypoactive bowel sounds, no masses, no organomegaly Male Genitalia: Deferred, the patient denies priapism Extremities: Normal, atraumatic, no cyanosis, no edema, Homans sign is negative, no sign of DVT, limited ROM LLE Pulses: 2+ and symmetric Skin: Skin color, texture, turgor normal, No rashes or lesions, LLE hyperpigmentation, well healed scars on trunk and extremities  Neurologic: Grossly normal, AO x 3, CN  II - XII grossly intact, no focal deficits Psych:  Appropriate affect  Lab Results: Results for orders placed during the hospital encounter of 05/12/11 (from the past 48 hour(s))  CBC     Status: Abnormal   Collection Time   05/17/11  6:25 AM      Component Value Range Comment   WBC 13.2 (*) 4.0 - 10.5 (K/uL) WHITE COUNT CONFIRMED ON SMEAR   RBC 3.13 (*) 4.22 - 5.81 (MIL/uL)    Hemoglobin 10.3 (*) 13.0 - 17.0 (g/dL)    HCT 40.9 (*) 81.1 - 52.0 (%)    MCV 87.9  78.0 - 100.0 (fL)    MCH 32.9  26.0 - 34.0 (pg)    MCHC 37.5 (*) 30.0 - 36.0 (g/dL) SICKLE CELLS   RDW 91.4 (*) 11.5 - 15.5 (%)    Platelets 233  150 - 400 (K/uL)   COMPREHENSIVE METABOLIC PANEL     Status: Abnormal   Collection Time   05/17/11  6:25 AM      Component Value Range Comment   Sodium 138  135 - 145 (mEq/L)    Potassium 4.2  3.5 - 5.1 (mEq/L)    Chloride 103  96 - 112 (mEq/L)    CO2 26  19 - 32 (mEq/L)    Glucose, Bld 99  70 - 99 (mg/dL)    BUN 10  6 - 23 (mg/dL)    Creatinine, Ser 7.82  0.50 - 1.35 (mg/dL)    Calcium 9.1  8.4 - 10.5 (mg/dL)    Total Protein 7.4  6.0 - 8.3 (g/dL)    Albumin 4.1  3.5 - 5.2 (g/dL)    AST 33  0 - 37 (U/L)  ALT 21  0 - 53 (U/L)    Alkaline Phosphatase 80  39 - 117 (U/L)    Total Bilirubin 3.0 (*) 0.3 - 1.2 (mg/dL)    GFR calc non Af Amer >90  >90 (mL/min)    GFR calc Af Amer >90  >90 (mL/min)   CBC     Status: Abnormal   Collection Time   05/18/11  5:00 AM      Component Value Range Comment   WBC 12.5 (*) 4.0 - 10.5 (K/uL)    RBC 3.12 (*) 4.22 - 5.81 (MIL/uL)    Hemoglobin 10.2 (*) 13.0 - 17.0 (g/dL)    HCT 95.6 (*) 21.3 - 52.0 (%)    MCV 87.5  78.0 - 100.0 (fL)    MCH 32.7  26.0 - 34.0 (pg)    MCHC 37.4 (*) 30.0 - 36.0 (g/dL) SICKLE CELLS   RDW 08.6 (*) 11.5 - 15.5 (%)    Platelets 246  150 - 400 (K/uL)   COMPREHENSIVE METABOLIC PANEL     Status: Abnormal   Collection Time   05/18/11  5:00 AM      Component Value Range Comment   Sodium 136  135 - 145 (mEq/L)     Potassium 4.1  3.5 - 5.1 (mEq/L)    Chloride 101  96 - 112 (mEq/L)    CO2 26  19 - 32 (mEq/L)    Glucose, Bld 114 (*) 70 - 99 (mg/dL)    BUN 7  6 - 23 (mg/dL)    Creatinine, Ser 5.78  0.50 - 1.35 (mg/dL)    Calcium 9.2  8.4 - 10.5 (mg/dL)    Total Protein 8.1  6.0 - 8.3 (g/dL)    Albumin 4.2  3.5 - 5.2 (g/dL)    AST 34  0 - 37 (U/L)    ALT 24  0 - 53 (U/L)    Alkaline Phosphatase 88  39 - 117 (U/L)    Total Bilirubin 2.6 (*) 0.3 - 1.2 (mg/dL)    GFR calc non Af Amer >90  >90 (mL/min)    GFR calc Af Amer >90  >90 (mL/min)     Studies/Results: Dg Hip 1 View Left 05/15/2011  *RADIOLOGY REPORT*  Clinical Data: Severe left hip pain, history of AVN  LEFT HIP - 1 VIEW:  Comparison: 04/23/2006  Findings: Single view of the left hip submitted.  No acute fracture or subluxation.  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  Stable minimal contour irregularity of the left femoral head.  IMPRESSION: No acute fracture or subluxation.  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  Original Report Authenticated By: Natasha Mead, M.D.   Chest Portable 1 View Post Insertion To Confirm Placement As Interpreted By Radiologist 05/16/2011  *RADIOLOGY REPORT*  Clinical Data: Right-sided PICC line placement  PORTABLE CHEST - 1 VIEW  Comparison: 05/13/2011  Findings: Right-sided PICC line tip terminates over the proximal SVC.  No pneumothorax. Lung volumes are low with crowding of the bronchovascular markings.  Lung volumes are lower than on the prior study with bibasilar atelectasis.  Heart size is upper limits of normal. Aorta is ectatic and unfolded.  IMPRESSION: Right-sided PICC line tip over proximal SVC.  If cavoatrial positioning is desired, this could be advanced 9.5 cm.  Original Report Authenticated By: Harrel Lemon, M.D.   Dg Chest Portable 1 View 05/13/2011  *RADIOLOGY REPORT*  Clinical Data:  Chest pain  PORTABLE CHEST - 1 VIEW  Comparison: 01/05/2011  Findings: Prompt heart size and vascular congestion.  Scattered areas of parenchymal scarring/atelectasis.  Minimal residual streaky basilar opacities.  No effusion or pneumothorax.  Trachea midline.  Slight curvature of the spine may be positional.  IMPRESSION: Mild cardiac enlargement with vascular congestion.  Residual perihilar and basilar scarring/atelectasis.  Improving basilar aeration.  Original Report Authenticated By: Judie Petit. Ruel Favors, M.D.   Medications: I have reviewed the patient's current medications. No Known Allergies  Current Facility-Administered Medications  Medication Dose Route Frequency Provider Last Rate Last Dose  . dextrose 5 %-0.45 % sodium chloride infusion   Intravenous Continuous Larina Bras, NP 100 mL/hr at 05/18/11 0747    . diphenhydrAMINE (BENADRYL) injection 12.5-25 mg  12.5-25 mg Intravenous Q4H Larina Bras, NP   25 mg at 05/17/11 0414   Or  . diphenhydrAMINE (BENADRYL) capsule 25-50 mg  25-50 mg Oral Q4H Larina Bras, NP   25 mg at 05/18/11 1201  . docusate sodium (COLACE) capsule 100 mg  100 mg Oral BID Larina Bras, NP   100 mg at 05/18/11 0928  . enoxaparin (LOVENOX) injection 40 mg  40 mg Subcutaneous Q24H Debby Crosley, MD   40 mg at 05/14/11 1017  . folic acid (FOLVITE) tablet 1 mg  1 mg Oral Daily Debby Crosley, MD   1 mg at 05/18/11 0928  . guaiFENesin-dextromethorphan (ROBITUSSIN DM) 100-10 MG/5ML syrup 5 mL  5 mL Oral QID Otho Bellows, PHARMD      . HYDROmorphone (DILAUDID) injection 2-4 mg  2-4 mg Intravenous Q2H Larina Bras, NP   2 mg at 05/18/11 1200  . hydroxyurea (HYDREA) capsule 500 mg  500 mg Oral BID Larina Bras, NP   500 mg at 05/18/11 0747  . ketorolac (TORADOL) 30 MG/ML injection 30 mg  30 mg Intravenous Q6H Larina Bras, NP      . levalbuterol (XOPENEX) nebulizer solution 0.63 mg  0.63 mg Nebulization Q6H PRN Larina Bras, NP      .  lidocaine (LIDODERM) 5 % 1 patch  1 patch Transdermal Q24H Larina Bras, NP   1 patch at 05/17/11 1813  . morphine (MS CONTIN) 12 hr tablet 60 mg  60 mg Oral BID Gery Pray, MD   60 mg at 05/18/11 0928  . morphine (MSIR) tablet 30 mg  30 mg Oral Q4H PRN Gery Pray, MD   30 mg at 05/14/11 0905  . moxifloxacin (AVELOX) tablet 400 mg  400 mg Oral q1800 Larina Bras, NP      . nicotine (NICODERM CQ - dosed in mg/24 hr) patch 7 mg  7 mg Transdermal Daily Debby Crosley, MD   7 mg at 05/18/11 0931  . pantoprazole (PROTONIX) EC tablet 40 mg  40 mg Oral Q0600 Larina Bras, NP   40 mg at 05/18/11 1610  . promethazine (PHENERGAN) injection 12.5-25 mg  12.5-25 mg Intravenous Q4H PRN Larina Bras, NP   12.5 mg at 05/18/11 1210   Or  . promethazine (PHENERGAN) tablet 25 mg  25 mg Oral Q4H PRN Larina Bras, NP   25 mg at 05/15/11 2150  . sodium chloride 0.9 % injection 10-40 mL  10-40 mL Intracatheter Q12H Willey Blade, MD   10 mL at 05/18/11 1200  . sodium chloride 0.9 % injection 10-40 mL  10-40 mL Intracatheter PRN Willey Blade, MD      . sorbitol 70 % solution 30 mL  30 mL Oral  Daily PRN Hillery Aldo, MD   30 mL at 05/18/11 1200  . DISCONTD: dextromethorphan-guaiFENesin (MUCINEX DM) 30-600 MG per 12 hr tablet 1 tablet  1 tablet Oral BID Hillery Aldo, MD   1 tablet at 05/18/11 0928  . DISCONTD: guaiFENesin-dextromethorphan (ROBITUSSIN DM) 100-10 MG/5ML syrup 5 mL  5 mL Oral QID Larina Bras, NP      . DISCONTD: moxifloxacin (AVELOX) IVPB 400 mg  400 mg Intravenous Q24H Larina Bras, NP   400 mg at 05/17/11 1653    Assessment/Plan: Patient Active Problem List  Diagnoses  . Sickle cell anemia with crisis  . Constipation  . Bronchitis  . Acute chest syndrome  . Tobacco abuse   Sickle Cell Crisis:  The patient will continue receiving pain management, IV hydration, Hydroxyurea, pruritis/nausea management, NSAID therapy (with Toradol), DVT/GI prophylaxis.   Hemoglobinopathy is pending.  CMP, ProBNP and CBC in the am.   Acute Chest Syndrome/Bronchitis:  The patient will be receiving antibiotic therapy with Avelox, nebulizer treatments with the flutter valve Robitussin DM. Tobacco Abuse:   The patient is receiving  a nicotine patch daily. Constipation:  The patient has had a bowel movent.  Patient continues to receive Colace BID and Sorbitol PRN.  Discussed and agreed upon plan of care with the patient.   The plan of care will be adjusted based on the patient's clinical progress.   Larina Bras, NP-C 05/18/2011, 12:45 PM

## 2011-05-19 LAB — HEMOGLOBINOPATHY EVALUATION
Hemoglobin Other: 43 % — ABNORMAL HIGH
Hgb A2 Quant: 2.6 % (ref 2.2–3.2)

## 2011-05-19 LAB — CBC
HCT: 26.6 % — ABNORMAL LOW (ref 39.0–52.0)
MCH: 32.3 pg (ref 26.0–34.0)
MCHC: 36.8 g/dL — ABNORMAL HIGH (ref 30.0–36.0)
MCV: 87.8 fL (ref 78.0–100.0)
Platelets: 256 10*3/uL (ref 150–400)
RDW: 16.8 % — ABNORMAL HIGH (ref 11.5–15.5)

## 2011-05-19 LAB — PRO B NATRIURETIC PEPTIDE: Pro B Natriuretic peptide (BNP): 205 pg/mL — ABNORMAL HIGH (ref 0–125)

## 2011-05-19 LAB — PREPARE RBC (CROSSMATCH)

## 2011-05-19 LAB — COMPREHENSIVE METABOLIC PANEL
Albumin: 4.1 g/dL (ref 3.5–5.2)
BUN: 8 mg/dL (ref 6–23)
Calcium: 9.1 mg/dL (ref 8.4–10.5)
Creatinine, Ser: 0.81 mg/dL (ref 0.50–1.35)
Total Bilirubin: 2.3 mg/dL — ABNORMAL HIGH (ref 0.3–1.2)
Total Protein: 7.5 g/dL (ref 6.0–8.3)

## 2011-05-19 NOTE — Progress Notes (Signed)
Subjective: The patient was seen on rounds today.  The patient was laying in his bed, watching television and visiting with a male visitor.  The patient states his pain is about the same today, rated as 8/10 in his chest (non-cardiac), back and left hip. The patient has a productive cough of green sputum.  The patient has requested Sorbitol and states that he feels slightly constipated.  No other patient or nursing concerns.   Objective: Vital signs in last 24 hours: Blood pressure 145/75, pulse 80, temperature 97.6 F (36.4 C), temperature source Oral, resp. rate 18, height 6\' 2"  (1.88 m), weight 175 lb 1.6 oz (79.425 kg), SpO2 93.00%.  General Appearance: Alert, cooperative, well developed, mild distress Head: Normocephalic, without obvious abnormality, atraumatic Eyes: PERRLA, EOMI, scleral icterus Nose: Nares, septum and mucosa are normal, no drainage or sinus tenderness Throat: Lips, mucosa, and tongue normal; teeth and gums normal Neck: No adenopathy, supple, symmetrical, trachea midline,thyroid not enlarged, symmetric, no tenderness/mass/nodules Back: Symmetric, ROM normal, no CVA tenderness Resp: Diminished breath sounds bibasilar and bilaterally, productive cough Cardio: Regular rate and rhythm, S1, S2 normal, no murmur, click, rub or gallop GI: Soft, non-tender, non-distended, hypoactive bowel sounds, no masses, no organomegaly Male Genitalia: Deferred, the patient denies priapism Extremities: Normal, atraumatic, no cyanosis, no edema, Homans sign is negative, no sign of DVT, limited ROM LLE Pulses: 2+ and symmetric Skin: Skin color, texture, turgor normal, No rashes or lesions, LLE hyperpigmentation, well healed scars on trunk and extremities  Neurologic: Grossly normal, AO x 3, CN II - XII grossly intact, no focal deficits Psych:  Appropriate affect  Lab Results: Results for orders placed during the hospital encounter of 05/12/11 (from the past 48 hour(s))  CBC     Status:  Abnormal   Collection Time   05/18/11  5:00 AM      Component Value Range Comment   WBC 12.5 (*) 4.0 - 10.5 (K/uL)    RBC 3.12 (*) 4.22 - 5.81 (MIL/uL)    Hemoglobin 10.2 (*) 13.0 - 17.0 (g/dL)    HCT 16.1 (*) 09.6 - 52.0 (%)    MCV 87.5  78.0 - 100.0 (fL)    MCH 32.7  26.0 - 34.0 (pg)    MCHC 37.4 (*) 30.0 - 36.0 (g/dL) SICKLE CELLS   RDW 04.5 (*) 11.5 - 15.5 (%)    Platelets 246  150 - 400 (K/uL)   COMPREHENSIVE METABOLIC PANEL     Status: Abnormal   Collection Time   05/18/11  5:00 AM      Component Value Range Comment   Sodium 136  135 - 145 (mEq/L)    Potassium 4.1  3.5 - 5.1 (mEq/L)    Chloride 101  96 - 112 (mEq/L)    CO2 26  19 - 32 (mEq/L)    Glucose, Bld 114 (*) 70 - 99 (mg/dL)    BUN 7  6 - 23 (mg/dL)    Creatinine, Ser 4.09  0.50 - 1.35 (mg/dL)    Calcium 9.2  8.4 - 10.5 (mg/dL)    Total Protein 8.1  6.0 - 8.3 (g/dL)    Albumin 4.2  3.5 - 5.2 (g/dL)    AST 34  0 - 37 (U/L)    ALT 24  0 - 53 (U/L)    Alkaline Phosphatase 88  39 - 117 (U/L)    Total Bilirubin 2.6 (*) 0.3 - 1.2 (mg/dL)    GFR calc non Af Amer >90  >90 (mL/min)  GFR calc Af Amer >90  >90 (mL/min)   CBC     Status: Abnormal   Collection Time   05/19/11  6:00 AM      Component Value Range Comment   WBC 11.4 (*) 4.0 - 10.5 (K/uL)    RBC 3.03 (*) 4.22 - 5.81 (MIL/uL)    Hemoglobin 9.8 (*) 13.0 - 17.0 (g/dL)    HCT 40.9 (*) 81.1 - 52.0 (%)    MCV 87.8  78.0 - 100.0 (fL)    MCH 32.3  26.0 - 34.0 (pg)    MCHC 36.8 (*) 30.0 - 36.0 (g/dL)    RDW 91.4 (*) 78.2 - 15.5 (%)    Platelets 256  150 - 400 (K/uL)   COMPREHENSIVE METABOLIC PANEL     Status: Abnormal   Collection Time   05/19/11  6:00 AM      Component Value Range Comment   Sodium 138  135 - 145 (mEq/L)    Potassium 4.3  3.5 - 5.1 (mEq/L)    Chloride 102  96 - 112 (mEq/L)    CO2 28  19 - 32 (mEq/L)    Glucose, Bld 99  70 - 99 (mg/dL)    BUN 8  6 - 23 (mg/dL)    Creatinine, Ser 9.56  0.50 - 1.35 (mg/dL)    Calcium 9.1  8.4 - 10.5 (mg/dL)     Total Protein 7.5  6.0 - 8.3 (g/dL)    Albumin 4.1  3.5 - 5.2 (g/dL)    AST 46 (*) 0 - 37 (U/L)    ALT 32  0 - 53 (U/L)    Alkaline Phosphatase 98  39 - 117 (U/L)    Total Bilirubin 2.3 (*) 0.3 - 1.2 (mg/dL)    GFR calc non Af Amer >90  >90 (mL/min)    GFR calc Af Amer >90  >90 (mL/min)   PRO B NATRIURETIC PEPTIDE     Status: Abnormal   Collection Time   05/19/11  6:00 AM      Component Value Range Comment   Pro B Natriuretic peptide (BNP) 205.0 (*) 0 - 125 (pg/mL)     Studies/Results: No results found.  Medications: I have reviewed the patient's current medications. No Known Allergies  Current Facility-Administered Medications  Medication Dose Route Frequency Provider Last Rate Last Dose  . dextrose 5 %-0.45 % sodium chloride infusion   Intravenous Continuous Larina Bras, NP 100 mL/hr at 05/19/11 1304 100 mL/hr at 05/19/11 1304  . diphenhydrAMINE (BENADRYL) injection 12.5-25 mg  12.5-25 mg Intravenous Q4H Larina Bras, NP   25 mg at 05/17/11 0414   Or  . diphenhydrAMINE (BENADRYL) capsule 25-50 mg  25-50 mg Oral Q4H Larina Bras, NP   25 mg at 05/19/11 1216  . docusate sodium (COLACE) capsule 100 mg  100 mg Oral BID Larina Bras, NP   100 mg at 05/19/11 0946  . enoxaparin (LOVENOX) injection 40 mg  40 mg Subcutaneous Q24H Debby Crosley, MD   40 mg at 05/19/11 0952  . folic acid (FOLVITE) tablet 1 mg  1 mg Oral Daily Debby Crosley, MD   1 mg at 05/19/11 0946  . guaiFENesin-dextromethorphan (ROBITUSSIN DM) 100-10 MG/5ML syrup 5 mL  5 mL Oral QID Otho Bellows, PHARMD   5 mL at 05/19/11 1359  . HYDROmorphone (DILAUDID) injection 2-4 mg  2-4 mg Intravenous Q2H Larina Bras, NP   2 mg at 05/19/11 1359  . hydroxyurea (HYDREA) capsule 500 mg  500 mg  Oral BID Larina Bras, NP   500 mg at 05/19/11 0802  . ketorolac (TORADOL) 30 MG/ML injection 30 mg  30 mg Intravenous Q6H Larina Bras, NP   30 mg at 05/19/11 1216  . levalbuterol (XOPENEX) nebulizer  solution 0.63 mg  0.63 mg Nebulization Q6H PRN Larina Bras, NP      . lidocaine (LIDODERM) 5 % 1 patch  1 patch Transdermal Q24H Larina Bras, NP   1 patch at 05/18/11 1758  . morphine (MS CONTIN) 12 hr tablet 60 mg  60 mg Oral BID Debby Crosley, MD   60 mg at 05/19/11 0946  . morphine (MSIR) tablet 30 mg  30 mg Oral Q4H PRN Gery Pray, MD   30 mg at 05/14/11 0905  . moxifloxacin (AVELOX) tablet 400 mg  400 mg Oral q1800 Larina Bras, NP   400 mg at 05/18/11 1757  . nicotine (NICODERM CQ - dosed in mg/24 hr) patch 7 mg  7 mg Transdermal Daily Debby Crosley, MD   7 mg at 05/19/11 0948  . pantoprazole (PROTONIX) EC tablet 40 mg  40 mg Oral Q0600 Larina Bras, NP   40 mg at 05/19/11 0612  . promethazine (PHENERGAN) injection 12.5-25 mg  12.5-25 mg Intravenous Q4H PRN Larina Bras, NP   12.5 mg at 05/18/11 1210   Or  . promethazine (PHENERGAN) tablet 25 mg  25 mg Oral Q4H PRN Larina Bras, NP   25 mg at 05/15/11 2150  . sodium chloride 0.9 % injection 10-40 mL  10-40 mL Intracatheter Q12H Willey Blade, MD   10 mL at 05/19/11 1049  . sodium chloride 0.9 % injection 10-40 mL  10-40 mL Intracatheter PRN Willey Blade, MD   10 mL at 05/18/11 1545  . sorbitol 70 % solution 30 mL  30 mL Oral Daily PRN Hillery Aldo, MD   30 mL at 05/19/11 1403     Assessment/Plan: Patient Active Problem List  Diagnoses  . Sickle cell anemia with crisis  . Constipation  . Bronchitis  . Acute chest syndrome  . Tobacco abuse   Sickle Cell Crisis: The patient will have a blood exchange today.  The patient will continue receiving pain management, IV hydration, Hydroxyurea, pruritis/nausea management, NSAID therapy for another 24 hours (with Toradol), DVT/GI prophylaxis.  Hemoglobinopathy has been received.  CMP, ProBNP, Ferritin and CBC in the am.   Acute Chest Syndrome/Bronchitis:  The patient is receiving antibiotic therapy with Avelox, nebulizer treatments with the flutter valve and  Robitussin DM. Tobacco Abuse:   The patient is receiving  a nicotine patch daily. Constipation:  The patient has had a bowel movements but feels slightly constipated today.  Patient continues to receive Colace BID and Sorbitol PRN.  Discussed and agreed upon plan of care with the patient.   The plan of care will be adjusted based on the patient's clinical progress.   Larina Bras, NP-C 05/19/2011, 2:58 PM

## 2011-05-20 LAB — CBC
Hemoglobin: 9.5 g/dL — ABNORMAL LOW (ref 13.0–17.0)
MCH: 32.1 pg (ref 26.0–34.0)
MCHC: 37.5 g/dL — ABNORMAL HIGH (ref 30.0–36.0)

## 2011-05-20 LAB — COMPREHENSIVE METABOLIC PANEL
ALT: 25 U/L (ref 0–53)
BUN: 8 mg/dL (ref 6–23)
Calcium: 8.8 mg/dL (ref 8.4–10.5)
GFR calc Af Amer: >90 mL/min (ref >90)
Glucose, Bld: 95 mg/dL (ref 70–99)
Sodium: 135 mEq/L (ref 135–145)
Total Protein: 7.1 g/dL (ref 6.0–8.3)

## 2011-05-20 LAB — FERRITIN: Ferritin: 724 ng/mL — ABNORMAL HIGH (ref 22–322)

## 2011-05-20 NOTE — Progress Notes (Signed)
Subjective:  Patient reports he's feeling better today. He rates his pain as a 7-8/10. No new sites of pain at this time. He ambulated briefly with his father while visiting. Patient denies any increase shortness of breath. He is using his incentive spirometer as well. His medications are controlling his pain at this time.   No Known Allergies Current Facility-Administered Medications  Medication Dose Route Frequency Provider Last Rate Last Dose  . dextrose 5 %-0.45 % sodium chloride infusion   Intravenous Continuous Larina Bras, NP 100 mL/hr at 05/20/11 1254    . diphenhydrAMINE (BENADRYL) injection 12.5-25 mg  12.5-25 mg Intravenous Q4H Larina Bras, NP   25 mg at 05/17/11 0414   Or  . diphenhydrAMINE (BENADRYL) capsule 25-50 mg  25-50 mg Oral Q4H Larina Bras, NP   25 mg at 05/20/11 1623  . docusate sodium (COLACE) capsule 100 mg  100 mg Oral BID Larina Bras, NP   100 mg at 05/20/11 1009  . enoxaparin (LOVENOX) injection 40 mg  40 mg Subcutaneous Q24H Debby Crosley, MD   40 mg at 05/20/11 1011  . folic acid (FOLVITE) tablet 1 mg  1 mg Oral Daily Debby Crosley, MD   1 mg at 05/20/11 1010  . guaiFENesin-dextromethorphan (ROBITUSSIN DM) 100-10 MG/5ML syrup 5 mL  5 mL Oral QID Otho Bellows, PHARMD   5 mL at 05/20/11 1811  . HYDROmorphone (DILAUDID) injection 2-4 mg  2-4 mg Intravenous Q2H Larina Bras, NP   2 mg at 05/20/11 1810  . hydroxyurea (HYDREA) capsule 500 mg  500 mg Oral BID Larina Bras, NP   500 mg at 05/20/11 0848  . ketorolac (TORADOL) 30 MG/ML injection 30 mg  30 mg Intravenous Q6H Larina Bras, NP   30 mg at 05/20/11 0605  . levalbuterol (XOPENEX) nebulizer solution 0.63 mg  0.63 mg Nebulization Q6H PRN Larina Bras, NP      . lidocaine (LIDODERM) 5 % 1 patch  1 patch Transdermal Q24H Larina Bras, NP   1 patch at 05/20/11 1811  . morphine (MS CONTIN) 12 hr tablet 60 mg  60 mg Oral BID Debby Crosley, MD   60 mg at 05/20/11 1009  .  morphine (MSIR) tablet 30 mg  30 mg Oral Q4H PRN Gery Pray, MD   30 mg at 05/14/11 0905  . moxifloxacin (AVELOX) tablet 400 mg  400 mg Oral q1800 Larina Bras, NP   400 mg at 05/20/11 1810  . nicotine (NICODERM CQ - dosed in mg/24 hr) patch 7 mg  7 mg Transdermal Daily Debby Crosley, MD   7 mg at 05/20/11 1013  . pantoprazole (PROTONIX) EC tablet 40 mg  40 mg Oral Q0600 Larina Bras, NP   40 mg at 05/20/11 0605  . promethazine (PHENERGAN) injection 12.5-25 mg  12.5-25 mg Intravenous Q4H PRN Larina Bras, NP   25 mg at 05/20/11 1223   Or  . promethazine (PHENERGAN) tablet 25 mg  25 mg Oral Q4H PRN Larina Bras, NP   25 mg at 05/19/11 2018  . sodium chloride 0.9 % injection 10-40 mL  10-40 mL Intracatheter Q12H Willey Blade, MD   10 mL at 05/19/11 2202  . sodium chloride 0.9 % injection 10-40 mL  10-40 mL Intracatheter PRN Willey Blade, MD   10 mL at 05/19/11 1935  . sorbitol 70 % solution 30 mL  30 mL Oral Daily PRN Hillery Aldo, MD   30 mL at 05/19/11 1403    Objective: Blood pressure 126/80, pulse  91, temperature 96.6 F (35.9 C), temperature source Oral, resp. rate 18, height 6\' 2"  (1.88 m), weight 175 lb 1.6 oz (79.425 kg), SpO2 90.00%.  Well-developed well-nourished black male in no acute distress. HEENT: No sinus tenderness. NECK: No posterior cervical nodes. LUNGS: Clear to auscultation. CV: Normal S1, S2 without S3. ABD: No epigastric tenderness. MSK: Negative Homans no edema. NEURO: Intact.  Lab results: Results for orders placed during the hospital encounter of 05/12/11 (from the past 48 hour(s))  CBC     Status: Abnormal   Collection Time   05/19/11  6:00 AM      Component Value Range Comment   WBC 11.4 (*) 4.0 - 10.5 (K/uL)    RBC 3.03 (*) 4.22 - 5.81 (MIL/uL)    Hemoglobin 9.8 (*) 13.0 - 17.0 (g/dL)    HCT 16.1 (*) 09.6 - 52.0 (%)    MCV 87.8  78.0 - 100.0 (fL)    MCH 32.3  26.0 - 34.0 (pg)    MCHC 36.8 (*) 30.0 - 36.0 (g/dL)    RDW 04.5 (*) 40.9 -  15.5 (%)    Platelets 256  150 - 400 (K/uL)   COMPREHENSIVE METABOLIC PANEL     Status: Abnormal   Collection Time   05/19/11  6:00 AM      Component Value Range Comment   Sodium 138  135 - 145 (mEq/L)    Potassium 4.3  3.5 - 5.1 (mEq/L)    Chloride 102  96 - 112 (mEq/L)    CO2 28  19 - 32 (mEq/L)    Glucose, Bld 99  70 - 99 (mg/dL)    BUN 8  6 - 23 (mg/dL)    Creatinine, Ser 8.11  0.50 - 1.35 (mg/dL)    Calcium 9.1  8.4 - 10.5 (mg/dL)    Total Protein 7.5  6.0 - 8.3 (g/dL)    Albumin 4.1  3.5 - 5.2 (g/dL)    AST 46 (*) 0 - 37 (U/L)    ALT 32  0 - 53 (U/L)    Alkaline Phosphatase 98  39 - 117 (U/L)    Total Bilirubin 2.3 (*) 0.3 - 1.2 (mg/dL)    GFR calc non Af Amer >90  >90 (mL/min)    GFR calc Af Amer >90  >90 (mL/min)   PRO B NATRIURETIC PEPTIDE     Status: Abnormal   Collection Time   05/19/11  6:00 AM      Component Value Range Comment   Pro B Natriuretic peptide (BNP) 205.0 (*) 0 - 125 (pg/mL)   PREPARE RBC (CROSSMATCH)     Status: Normal   Collection Time   05/19/11  3:30 PM      Component Value Range Comment   Order Confirmation ORDER PROCESSED BY BLOOD BANK     TYPE AND SCREEN     Status: Normal   Collection Time   05/19/11  3:55 PM      Component Value Range Comment   ABO/RH(D) O POS      Antibody Screen NEG      Sample Expiration 05/22/2011      Unit Number [id number]      Blood Component Type RBC LR PHER2      Unit division 00      Status of Unit ISSUED,FINAL      Transfusion Status OK TO TRANSFUSE      Crossmatch Result Compatible     CBC     Status: Abnormal  Collection Time   05/20/11  6:05 AM      Component Value Range Comment   WBC 9.9  4.0 - 10.5 (K/uL)    RBC 2.96 (*) 4.22 - 5.81 (MIL/uL)    Hemoglobin 9.5 (*) 13.0 - 17.0 (g/dL)    HCT 81.1 (*) 91.4 - 52.0 (%)    MCV 86.8  78.0 - 100.0 (fL)    MCH 32.1  26.0 - 34.0 (pg)    MCHC 37.5 (*) 30.0 - 36.0 (g/dL) SICKLE CELLS   RDW 78.2 (*) 11.5 - 15.5 (%)    Platelets 226  150 - 400 (K/uL)     COMPREHENSIVE METABOLIC PANEL     Status: Abnormal   Collection Time   05/20/11  6:05 AM      Component Value Range Comment   Sodium 135  135 - 145 (mEq/L)    Potassium 3.9  3.5 - 5.1 (mEq/L)    Chloride 102  96 - 112 (mEq/L)    CO2 29  19 - 32 (mEq/L)    Glucose, Bld 95  70 - 99 (mg/dL)    BUN 8  6 - 23 (mg/dL)    Creatinine, Ser 9.56  0.50 - 1.35 (mg/dL)    Calcium 8.8  8.4 - 10.5 (mg/dL)    Total Protein 7.1  6.0 - 8.3 (g/dL)    Albumin 3.7  3.5 - 5.2 (g/dL)    AST 28  0 - 37 (U/L)    ALT 25  0 - 53 (U/L)    Alkaline Phosphatase 89  39 - 117 (U/L)    Total Bilirubin 2.0 (*) 0.3 - 1.2 (mg/dL)    GFR calc non Af Amer >90  >90 (mL/min)    GFR calc Af Amer >90  >90 (mL/min)   FERRITIN     Status: Abnormal   Collection Time   05/20/11  6:05 AM      Component Value Range Comment   Ferritin 724 (*) 22 - 322 (ng/mL)   PRO B NATRIURETIC PEPTIDE     Status: Abnormal   Collection Time   05/20/11  6:05 AM      Component Value Range Comment   Pro B Natriuretic peptide (BNP) 184.6 (*) 0 - 125 (pg/mL)     Studies/Results: No results found.  Patient Active Problem List  Diagnoses  . Sickle cell anemia with crisis  . Constipation  . Bronchitis  . Acute chest syndrome  . Tobacco abuse    Impression: Sickle cell crisis gradually improving. Sickle cell lung disease. Acute bronchitis presently improving. He continues with the incentive spirometry use antibiotics and nebulized therapy. Tobacco abuse. Presently abstaining.    Plan: Continue his present regimen. Increase activity as tolerated. Followup CBC CMET in a.m.   August Saucer, Dain Laseter 05/20/2011 7:13 PM

## 2011-05-21 LAB — COMPREHENSIVE METABOLIC PANEL WITH GFR
ALT: 20 U/L (ref 0–53)
AST: 26 U/L (ref 0–37)
Albumin: 3.8 g/dL (ref 3.5–5.2)
Alkaline Phosphatase: 88 U/L (ref 39–117)
BUN: 7 mg/dL (ref 6–23)
CO2: 28 meq/L (ref 19–32)
Calcium: 8.9 mg/dL (ref 8.4–10.5)
Chloride: 104 meq/L (ref 96–112)
Creatinine, Ser: 0.77 mg/dL (ref 0.50–1.35)
GFR calc Af Amer: >90 mL/min (ref >90)
GFR calc non Af Amer: >90 mL/min (ref >90)
Glucose, Bld: 92 mg/dL (ref 70–99)
Potassium: 4 meq/L (ref 3.5–5.1)
Sodium: 138 meq/L (ref 135–145)
Total Bilirubin: 1.9 mg/dL — ABNORMAL HIGH (ref 0.3–1.2)
Total Protein: 7.2 g/dL (ref 6.0–8.3)

## 2011-05-21 LAB — CBC
Platelets: 243 10*3/uL (ref 150–400)
RBC: 2.9 MIL/uL — ABNORMAL LOW (ref 4.22–5.81)
WBC: 10.7 10*3/uL — ABNORMAL HIGH (ref 4.0–10.5)

## 2011-05-21 MED ORDER — KETOROLAC TROMETHAMINE 30 MG/ML IJ SOLN
30.0000 mg | Freq: Three times a day (TID) | INTRAMUSCULAR | Status: AC
  Administered 2011-05-21 – 2011-05-22 (×3): 30 mg via INTRAVENOUS
  Filled 2011-05-21 (×3): qty 1

## 2011-05-21 NOTE — Progress Notes (Signed)
Clinical Social Worker met with patient in order to follow up previous assessment due to extended length of stay.  Patient identified that he had a positive disposition and his pain was becoming manageable.  Patient has transportation plan. No CSW needs, signing-off.  Beverly Sessions MSW, LCSW [phone]

## 2011-05-21 NOTE — Progress Notes (Signed)
Subjective: The patient was seen on rounds today.  The patient was sitting on the side of his bed and watching television.  The patient states his pain is a 7-8/10 in his chest (non-cardiac), back and left hip. The patient states the pain in his left hip is the worst pain that he has.  The patient states that the pain in his chest and back are better.  The patient also states that his productive cough is getting better.  The patient has had a bowel movement.  The patient is not quite ready to manage his pain at home.  Explained to the patient that he will have a blood exchange today as he states blood exchanges help his pain.  No other patient or nursing concerns.   Objective: Vital signs in last 24 hours: Blood pressure 142/89, pulse 77, temperature 98.7 F (37.1 C), temperature source Oral, resp. rate 18, height 6\' 2"  (1.88 m), weight 175 lb 1.6 oz (79.425 kg), SpO2 92.00%.  General Appearance: Alert, cooperative, well developed, no apparent distress Head: Normocephalic, without obvious abnormality, atraumatic Eyes: PERRLA, EOMI, scleral icterus Nose: Nares, septum and mucosa are normal, no drainage or sinus tenderness Throat: Lips, mucosa, and tongue normal; teeth and gums normal Neck: No adenopathy, supple, symmetrical, trachea midline,thyroid not enlarged, symmetric, no tenderness/mass/nodules Back: Symmetric, ROM normal, no CVA tenderness Resp: Diminished breath sounds bibasilar and bilaterally Cardio: Regular rate and rhythm, S1, S2 normal, no murmur, click, rub or gallop GI: Soft, non-tender, non-distended, hypoactive bowel sounds, no masses, no organomegaly Male Genitalia: Deferred, the patient denies priapism Extremities: Normal, atraumatic, no cyanosis, no edema, Homans sign is negative, no sign of DVT, limited ROM LLE Pulses: 2+ and symmetric Skin: Skin color, texture, turgor normal, No rashes or lesions, LLE hyperpigmentation, well healed scars on trunk and extremities    Neurologic: Grossly normal, AO x 3, CN II - XII grossly intact, no focal deficits Psych:  Appropriate affect  Lab Results: Results for orders placed during the hospital encounter of 05/12/11 (from the past 48 hour(s))  PREPARE RBC (CROSSMATCH)     Status: Normal   Collection Time   05/19/11  3:30 PM      Component Value Range Comment   Order Confirmation ORDER PROCESSED BY BLOOD BANK     TYPE AND SCREEN     Status: Normal (Preliminary result)   Collection Time   05/19/11  3:55 PM      Component Value Range Comment   ABO/RH(D) O POS      Antibody Screen NEG      Sample Expiration 05/22/2011      Unit Number [id number]      Blood Component Type RBC LR PHER2      Unit division 00      Status of Unit ISSUED,FINAL      Transfusion Status OK TO TRANSFUSE      Crossmatch Result Compatible      Unit Number [id number]      Blood Component Type RBC LR PHER1      Unit division 00      Status of Unit ALLOCATED      Transfusion Status OK TO TRANSFUSE      Crossmatch Result Compatible     CBC     Status: Abnormal   Collection Time   05/20/11  6:05 AM      Component Value Range Comment   WBC 9.9  4.0 - 10.5 (K/uL)    RBC 2.96 (*) 4.22 - 5.81 (  MIL/uL)    Hemoglobin 9.5 (*) 13.0 - 17.0 (g/dL)    HCT 16.1 (*) 09.6 - 52.0 (%)    MCV 86.8  78.0 - 100.0 (fL)    MCH 32.1  26.0 - 34.0 (pg)    MCHC 37.5 (*) 30.0 - 36.0 (g/dL) SICKLE CELLS   RDW 04.5 (*) 11.5 - 15.5 (%)    Platelets 226  150 - 400 (K/uL)   COMPREHENSIVE METABOLIC PANEL     Status: Abnormal   Collection Time   05/20/11  6:05 AM      Component Value Range Comment   Sodium 135  135 - 145 (mEq/L)    Potassium 3.9  3.5 - 5.1 (mEq/L)    Chloride 102  96 - 112 (mEq/L)    CO2 29  19 - 32 (mEq/L)    Glucose, Bld 95  70 - 99 (mg/dL)    BUN 8  6 - 23 (mg/dL)    Creatinine, Ser 4.09  0.50 - 1.35 (mg/dL)    Calcium 8.8  8.4 - 10.5 (mg/dL)    Total Protein 7.1  6.0 - 8.3 (g/dL)    Albumin 3.7  3.5 - 5.2 (g/dL)    AST 28  0 - 37 (U/L)     ALT 25  0 - 53 (U/L)    Alkaline Phosphatase 89  39 - 117 (U/L)    Total Bilirubin 2.0 (*) 0.3 - 1.2 (mg/dL)    GFR calc non Af Amer >90  >90 (mL/min)    GFR calc Af Amer >90  >90 (mL/min)   FERRITIN     Status: Abnormal   Collection Time   05/20/11  6:05 AM      Component Value Range Comment   Ferritin 724 (*) 22 - 322 (ng/mL)   PRO B NATRIURETIC PEPTIDE     Status: Abnormal   Collection Time   05/20/11  6:05 AM      Component Value Range Comment   Pro B Natriuretic peptide (BNP) 184.6 (*) 0 - 125 (pg/mL)   CBC     Status: Abnormal   Collection Time   05/21/11  6:00 AM      Component Value Range Comment   WBC 10.7 (*) 4.0 - 10.5 (K/uL)    RBC 2.90 (*) 4.22 - 5.81 (MIL/uL)    Hemoglobin 9.3 (*) 13.0 - 17.0 (g/dL)    HCT 81.1 (*) 91.4 - 52.0 (%)    MCV 87.9  78.0 - 100.0 (fL)    MCH 32.1  26.0 - 34.0 (pg)    MCHC 36.5 (*) 30.0 - 36.0 (g/dL)    RDW 78.2 (*) 95.6 - 15.5 (%)    Platelets 243  150 - 400 (K/uL)   COMPREHENSIVE METABOLIC PANEL     Status: Abnormal   Collection Time   05/21/11  6:00 AM      Component Value Range Comment   Sodium 138  135 - 145 (mEq/L)    Potassium 4.0  3.5 - 5.1 (mEq/L)    Chloride 104  96 - 112 (mEq/L)    CO2 28  19 - 32 (mEq/L)    Glucose, Bld 92  70 - 99 (mg/dL)    BUN 7  6 - 23 (mg/dL)    Creatinine, Ser 2.13  0.50 - 1.35 (mg/dL)    Calcium 8.9  8.4 - 10.5 (mg/dL)    Total Protein 7.2  6.0 - 8.3 (g/dL)    Albumin 3.8  3.5 - 5.2 (g/dL)  AST 26  0 - 37 (U/L)    ALT 20  0 - 53 (U/L)    Alkaline Phosphatase 88  39 - 117 (U/L)    Total Bilirubin 1.9 (*) 0.3 - 1.2 (mg/dL)    GFR calc non Af Amer >90  >90 (mL/min)    GFR calc Af Amer >90  >90 (mL/min)     Studies/Results: No results found.  Medications: I have reviewed the patient's current medications. No Known Allergies  Current Facility-Administered Medications  Medication Dose Route Frequency Provider Last Rate Last Dose  . dextrose 5 %-0.45 % sodium chloride infusion   Intravenous  Continuous Larina Bras, NP 100 mL/hr at 05/21/11 0916    . diphenhydrAMINE (BENADRYL) injection 12.5-25 mg  12.5-25 mg Intravenous Q4H Larina Bras, NP   25 mg at 05/17/11 0414   Or  . diphenhydrAMINE (BENADRYL) capsule 25-50 mg  25-50 mg Oral Q4H Larina Bras, NP   25 mg at 05/21/11 1154  . docusate sodium (COLACE) capsule 100 mg  100 mg Oral BID Larina Bras, NP   100 mg at 05/21/11 0914  . enoxaparin (LOVENOX) injection 40 mg  40 mg Subcutaneous Q24H Debby Crosley, MD   40 mg at 05/20/11 1011  . folic acid (FOLVITE) tablet 1 mg  1 mg Oral Daily Debby Crosley, MD   1 mg at 05/21/11 0914  . guaiFENesin-dextromethorphan (ROBITUSSIN DM) 100-10 MG/5ML syrup 5 mL  5 mL Oral QID Otho Bellows, PHARMD   5 mL at 05/21/11 1348  . HYDROmorphone (DILAUDID) injection 2-4 mg  2-4 mg Intravenous Q2H Larina Bras, NP   2 mg at 05/21/11 1347  . hydroxyurea (HYDREA) capsule 500 mg  500 mg Oral BID Larina Bras, NP   500 mg at 05/21/11 0755  . ketorolac (TORADOL) 30 MG/ML injection 30 mg  30 mg Intravenous Q8H Larina Bras, NP   30 mg at 05/21/11 1347  . levalbuterol (XOPENEX) nebulizer solution 0.63 mg  0.63 mg Nebulization Q6H PRN Larina Bras, NP      . lidocaine (LIDODERM) 5 % 1 patch  1 patch Transdermal Q24H Larina Bras, NP   1 patch at 05/20/11 1811  . morphine (MS CONTIN) 12 hr tablet 60 mg  60 mg Oral BID Debby Crosley, MD   60 mg at 05/21/11 0914  . morphine (MSIR) tablet 30 mg  30 mg Oral Q4H PRN Gery Pray, MD   30 mg at 05/14/11 0905  . moxifloxacin (AVELOX) tablet 400 mg  400 mg Oral q1800 Larina Bras, NP   400 mg at 05/20/11 1810  . nicotine (NICODERM CQ - dosed in mg/24 hr) patch 7 mg  7 mg Transdermal Daily Debby Crosley, MD   7 mg at 05/21/11 0920  . pantoprazole (PROTONIX) EC tablet 40 mg  40 mg Oral Q0600 Larina Bras, NP   40 mg at 05/21/11 0607  . promethazine (PHENERGAN) injection 12.5-25 mg  12.5-25 mg Intravenous Q4H PRN  Larina Bras, NP   25 mg at 05/20/11 1223   Or  . promethazine (PHENERGAN) tablet 25 mg  25 mg Oral Q4H PRN Larina Bras, NP   25 mg at 05/21/11 0755  . sodium chloride 0.9 % injection 10-40 mL  10-40 mL Intracatheter Q12H Willey Blade, MD   10 mL at 05/21/11 1006  . sodium chloride 0.9 % injection 10-40 mL  10-40 mL Intracatheter PRN Willey Blade, MD   10 mL at 05/19/11 1935  . sorbitol 70 % solution 30 mL  30 mL Oral Daily PRN Hillery Aldo, MD   30 mL at 05/21/11 0920    Assessment/Plan: Patient Active Problem List  Diagnoses  . Sickle cell anemia with crisis  . Constipation  . Bronchitis  . Acute chest syndrome  . Tobacco abuse   Sickle Cell Crisis: The patient will have a blood exchange today.  The patient will continue receiving pain management, IV hydration, Hydroxyurea, pruritis/nausea management, anti-inflammatory therapy for another 24 hours (with Toradol), DVT/GI prophylaxis.  CMP and CBC in the am.   Acute Chest Syndrome/Bronchitis:  The patient is receiving antibiotic therapy with Avelox, nebulizer treatments with the flutter valve PRN and Robitussin DM. Tobacco Abuse:   The patient is receiving  a nicotine patch daily. Constipation:  The patient continues to receive Colace BID and Sorbitol PRN.  Discussed and agreed upon plan of care with the patient.   The plan of care will be adjusted based on the patient's clinical progress.   Larina Bras, NP-C 05/21/2011, 2:11 PM

## 2011-05-22 LAB — CBC
Hemoglobin: 9.4 g/dL — ABNORMAL LOW (ref 13.0–17.0)
MCH: 32.3 pg (ref 26.0–34.0)
MCHC: 36.3 g/dL — ABNORMAL HIGH (ref 30.0–36.0)
Platelets: 253 10*3/uL (ref 150–400)

## 2011-05-22 LAB — TYPE AND SCREEN
ABO/RH(D): O POS
Antibody Screen: NEGATIVE
Unit division: 0

## 2011-05-22 LAB — COMPREHENSIVE METABOLIC PANEL
ALT: 21 U/L (ref 0–53)
Calcium: 8.5 mg/dL (ref 8.4–10.5)
GFR calc Af Amer: >90 mL/min (ref >90)
Glucose, Bld: 102 mg/dL — ABNORMAL HIGH (ref 70–99)
Sodium: 138 mEq/L (ref 135–145)
Total Protein: 7.1 g/dL (ref 6.0–8.3)

## 2011-05-22 MED ORDER — MORPHINE SULFATE CR 60 MG PO TB12: 60.0000 mg | ORAL_TABLET | Freq: Two times a day (BID) | ORAL | Status: DC

## 2011-05-22 MED ORDER — IBUPROFEN 800 MG PO TABS: 800.0000 mg | ORAL_TABLET | Freq: Three times a day (TID) | ORAL | Status: AC | PRN

## 2011-05-22 MED ORDER — MORPHINE SULFATE 30 MG PO TABS: 30.0000 mg | ORAL_TABLET | ORAL | Status: DC | PRN

## 2011-05-22 MED ORDER — FOLIC ACID 1 MG PO TABS: 1.0000 mg | ORAL_TABLET | Freq: Every day | ORAL | Status: AC

## 2011-05-22 MED ORDER — LEVOFLOXACIN 750 MG PO TABS: 750.0000 mg | ORAL_TABLET | Freq: Every day | ORAL | Status: AC

## 2011-05-22 MED ORDER — PROMETHAZINE HCL 25 MG PO TABS: 25.0000 mg | ORAL_TABLET | Freq: Four times a day (QID) | ORAL | Status: DC | PRN

## 2011-05-22 NOTE — Discharge Summary (Signed)
Physician Discharge Summary  Patient ID: [name] MRN: [id number] DOB/AGE: April 04, 1976 36 y.o.  Admit date: 05/12/2011 Discharge date: 05/22/2011  Admission Diagnoses: Sickle cell anemia with crisis  Discharge Diagnoses:  Sickle cell anemia with crisis Constipation Bronchitis Acute chest syndrome Tobacco abuse   Discharged Condition: Stable  Hospital Course:  Please refer to the detailed History and Physical for information regarding this patient's admission.  In short, [name] is a  36 year old male with known history of sickle cell disease.  He presented to the ER complaining of chest and back pain for the 4-5 days prior.  The patient also reported some shortness of breath with deep breathing.  Upon admission to the hospital,  the patient had a PICC line inserted, received aggressive pain management, IV hydration, blood exchanges, IV antibiotic therapy with Avelox, nebulizer treatments, NSAID therapy, smoking cessation counseling, GI and DVT prophylaxis.  The patient started refusing the Lovenox injections during his hospitalization and the potential consequences of his medication refusal were explained to him.  The patient had excruciating pain in his left hip.  The left hip x-ray to rule out AVN was unremarkable.  The patient declined physical therapy for his ongoing hip pain.  The patient's pain has improved and the patient is ready to manage his pain at home.  The patient will be discharged to home in stable condition at this time.  Consults: None  Significant Diagnostic Studies:  Dg Hip 1 View Left 05/15/2011  *RADIOLOGY REPORT*  Clinical Data: Severe left hip pain, history of AVN  LEFT HIP - 1 VIEW:  Comparison: 04/23/2006  Findings: Single view of the left hip submitted.  No acute fracture or subluxation.  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  Stable minimal contour irregularity of the left femoral head. IMPRESSION: No  acute fracture or subluxation.  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  Original Report Authenticated By: Natasha Mead, M.D.   Chest Portable 1 View Post Insertion To Confirm Placement As Interpreted By Radiologist 05/16/2011  *RADIOLOGY REPORT*  Clinical Data: Right-sided PICC line placement  PORTABLE CHEST - 1 VIEW  Comparison: 05/13/2011  Findings: Right-sided PICC line tip terminates over the proximal SVC.  No pneumothorax. Lung volumes are low with crowding of the bronchovascular markings.  Lung volumes are lower than on the prior study with bibasilar atelectasis.  Heart size is upper limits of normal. Aorta is ectatic and unfolded.  IMPRESSION: Right-sided PICC line tip over proximal SVC.  If cavoatrial positioning is desired, this could be advanced 9.5 cm.  Original Report Authenticated By: Harrel Lemon, M.D.   Dg Chest Portable 1 View 05/13/2011  *RADIOLOGY REPORT*  Clinical Data: Chest pain  PORTABLE CHEST - 1 VIEW  Comparison: 01/05/2011  Findings: Prompt heart size and vascular congestion.  Scattered areas of parenchymal scarring/atelectasis.  Minimal residual streaky basilar opacities.  No effusion or pneumothorax.  Trachea midline.  Slight curvature of the spine may be positional.  IMPRESSION: Mild cardiac enlargement with vascular congestion.  Residual perihilar and basilar scarring/atelectasis.  Improving basilar aeration.  Original Report Authenticated By: Judie Petit. Ruel Favors, M.D.    Treatments:  IV hydration Antibiotics: Avelox Analgesia: Dilaudid Anticoagulation: Lovenox Respiratory therapy: O2 and Nebulizer treatments Procedures: PICC line insertion and blood exchanges  Discharge Exam: Blood pressure 133/79, pulse 60, temperature 98.5 F (36.9 C), temperature source Oral, resp. rate 18, height 6\' 2"  (  1.88 m), weight 175 lb 1.6 oz (79.425 kg), SpO2 93.00%.  General Appearance: Alert, cooperative, well developed, no  apparent distress  Head: Normocephalic, without obvious abnormality, atraumatic  Eyes: PERRLA, EOMI, scleral icterus  Nose: Nares, septum and mucosa are normal, no drainage or sinus tenderness  Throat: Lips, mucosa, and tongue normal; teeth and gums normal  Neck: No adenopathy, supple, symmetrical, trachea midline,thyroid not enlarged, symmetric, no tenderness/mass/nodules  Back: Symmetric, ROM normal, no CVA tenderness  Resp: Diminished breath sounds bibasilar and bilaterally, but improving  Cardio: Regular rate and rhythm, S1, S2 normal, no murmur, click, rub or gallop  GI: Soft, non-tender, non-distended, hypoactive bowel sounds, no masses, no organomegaly  Male Genitalia: Deferred, the patient denies priapism  Extremities: Normal, atraumatic, no cyanosis, no edema, Homans sign is negative, no sign of DVT, limited ROM LLE  Pulses: 2+ and symmetric  Skin: Skin color, texture, turgor normal, No rashes or lesions, LLE hyperpigmentation, well healed scars on trunk and extremities  Neurologic: Grossly normal, AO x 3, CN II - XII grossly intact, no focal deficits  Psych: Appropriate affect   Disposition: Home or Self Care  Discharge Orders    Future Orders Please Complete By Expires   Increase activity slowly      Discharge instructions      Comments:   TAKE ALL MEDICATIONS AS PRESCRIBED Keep warm, keep well hydrated, get plenty of rest Keep all follow-up appointments Keep legs elevated when you are experiencing pain     Medication List  As of 05/22/2011  2:12 PM   TAKE these medications         folic acid 1 MG tablet   Commonly known as: FOLVITE   Take 1 tablet (1 mg total) by mouth daily.      ibuprofen 800 MG tablet   Commonly known as: ADVIL,MOTRIN   Take 1 tablet (800 mg total) by mouth every 8 (eight) hours as needed for pain.      levofloxacin 750 MG tablet   Commonly known as: LEVAQUIN   Take 1 tablet (750 mg total) by mouth daily.      morphine 60 MG 12 hr tablet    Commonly known as: MS CONTIN   Take 1 tablet (60 mg total) by mouth 2 (two) times daily.      morphine 30 MG tablet   Commonly known as: MSIR   Take 1 tablet (30 mg total) by mouth every 4 (four) hours as needed. Breakthrough pain      promethazine 25 MG tablet   Commonly known as: PHENERGAN   Take 1 tablet (25 mg total) by mouth every 6 (six) hours as needed.           Follow-up Information    Follow up with AVBUERE,EDWIN A, MD. Schedule an appointment as soon as possible for a visit in 1 week.   Contact information:   [address] [place] [address] [phone]         The entire discharge summary including medications an the importance of adherence to outpatient follow-up was explained  to the patient.   He stated he understood and agreed.  All of his questions were answered and his concerns were addressed.  Discharge Time:  Greater than 30 minutes   Signed: Larina Bras 05/22/2011, 11:10 AM

## 2011-05-25 LAB — HGB ELECTROPHORESIS REFLEXED REPORT
Hemoglobin A - HGBRFX: 0 % — ABNORMAL LOW (ref >96.0)
Hemoglobin A2 - HGBRFX: 3.8 % — ABNORMAL HIGH (ref 1.8–3.5)
Hemoglobin Elect C: 44.1 % — ABNORMAL HIGH
Hemoglobin F - HGBRFX: 2.8 % — ABNORMAL HIGH (ref <2.0)
Hemoglobin S - HGBRFX: 49.3 % — ABNORMAL HIGH

## 2011-08-20 ENCOUNTER — Emergency Department (HOSPITAL_COMMUNITY): Payer: Medicare Other

## 2011-08-20 ENCOUNTER — Inpatient Hospital Stay (HOSPITAL_COMMUNITY)
Admission: EM | Admit: 2011-08-20 | Discharge: 2011-08-27 | DRG: 812 | Disposition: A | Discharge status: Home / Self Care | Payer: Medicare Other | Attending: Internal Medicine | Admitting: Internal Medicine

## 2011-08-20 ENCOUNTER — Encounter (HOSPITAL_COMMUNITY): Payer: Self-pay | Admitting: Emergency Medicine

## 2011-08-20 DIAGNOSIS — M25559 Pain in unspecified hip: Secondary | ICD-10-CM | POA: Diagnosis present

## 2011-08-20 DIAGNOSIS — D5700 Hb-SS disease with crisis, unspecified: Secondary | ICD-10-CM | POA: Diagnosis present

## 2011-08-20 DIAGNOSIS — J40 Bronchitis, not specified as acute or chronic: Secondary | ICD-10-CM | POA: Diagnosis present

## 2011-08-20 DIAGNOSIS — Z720 Tobacco use: Secondary | ICD-10-CM | POA: Diagnosis present

## 2011-08-20 DIAGNOSIS — F17200 Nicotine dependence, unspecified, uncomplicated: Secondary | ICD-10-CM | POA: Diagnosis present

## 2011-08-20 DIAGNOSIS — R7989 Other specified abnormal findings of blood chemistry: Secondary | ICD-10-CM

## 2011-08-20 DIAGNOSIS — K5900 Constipation, unspecified: Secondary | ICD-10-CM | POA: Diagnosis present

## 2011-08-20 DIAGNOSIS — D5701 Hb-SS disease with acute chest syndrome: Secondary | ICD-10-CM | POA: Diagnosis present

## 2011-08-20 DIAGNOSIS — Z96649 Presence of unspecified artificial hip joint: Secondary | ICD-10-CM

## 2011-08-20 DIAGNOSIS — R209 Unspecified disturbances of skin sensation: Secondary | ICD-10-CM | POA: Diagnosis not present

## 2011-08-20 DIAGNOSIS — M87052 Idiopathic aseptic necrosis of left femur: Secondary | ICD-10-CM | POA: Diagnosis present

## 2011-08-20 HISTORY — DX: Idiopathic aseptic necrosis of left femur: M87.052

## 2011-08-20 LAB — CBC
HCT: 31.7 % — ABNORMAL LOW (ref 39.0–52.0)
Hemoglobin: 11.5 g/dL — ABNORMAL LOW (ref 13.0–17.0)
MCHC: 36.3 g/dL — ABNORMAL HIGH (ref 30.0–36.0)
MCV: 91.6 fL (ref 78.0–100.0)

## 2011-08-20 LAB — RETICULOCYTES: Retic Count, Absolute: 107.3 10*3/uL (ref 19.0–186.0)

## 2011-08-20 LAB — BASIC METABOLIC PANEL
BUN: 13 mg/dL (ref 6–23)
Creatinine, Ser: 0.85 mg/dL (ref 0.50–1.35)
GFR calc non Af Amer: >90 mL/min (ref >90)
Glucose, Bld: 89 mg/dL (ref 70–99)
Potassium: 3.8 mEq/L (ref 3.5–5.1)

## 2011-08-20 LAB — DIFFERENTIAL
Eosinophils Relative: 6 % — ABNORMAL HIGH (ref 0–5)
Lymphocytes Relative: 30 % (ref 12–46)
Lymphs Abs: 3 10*3/uL (ref 0.7–4.0)
Monocytes Relative: 10 % (ref 3–12)
Neutrophils Relative %: 54 % (ref 43–77)

## 2011-08-20 MED ORDER — HYDROMORPHONE HCL PF 2 MG/ML IJ SOLN
2.0000 mg | Freq: Once | INTRAMUSCULAR | Status: AC
  Administered 2011-08-20: 2 mg via INTRAVENOUS
  Filled 2011-08-20: qty 1

## 2011-08-20 MED ORDER — ONDANSETRON HCL 4 MG/2ML IJ SOLN
4.0000 mg | Freq: Once | INTRAMUSCULAR | Status: AC
  Administered 2011-08-20: 4 mg via INTRAVENOUS
  Filled 2011-08-20: qty 2

## 2011-08-20 NOTE — ED Provider Notes (Addendum)
History     CSN: [id number]  Arrival date & time 08/20/11  2037   First MD Initiated Contact with Patient 08/20/11 2128      Chief Complaint  Patient presents with  . Sickle Cell Pain Crisis    (Consider location/radiation/quality/duration/timing/severity/associated sxs/prior treatment) Patient is a 36 y.o. male presenting with sickle cell pain. The history is provided by the patient.  Sickle Cell Pain Crisis  This is a recurrent problem. The current episode started 5 to 7 days ago. The onset was gradual. The problem occurs continuously. The problem has been gradually worsening. The pain is associated with an unknown factor. Pain location: Bilateral hips. Site of pain is localized in a joint. The pain is similar to prior episodes. The pain is severe. The symptoms are relieved by nothing. The symptoms are not relieved by one or more prescription drugs. The symptoms are aggravated by activity and movement. Associated symptoms include joint pain. Pertinent negatives include no chest pain, no diarrhea, no nausea, no vomiting, no rhinorrhea, no weakness, no cough and no difficulty breathing. There is no swelling present. He has been behaving normally. He has been eating and drinking normally. Urine output has been normal. His past medical history is significant for chronic pain. He sickle cell type is SS. There is no history of acute chest syndrome. There have been no frequent pain crises. There is no history of stroke. He has been treated with chronic transfusion therapy. He has not been treated with hydroxyurea. There were no sick contacts.    Past Medical History  Diagnosis Date  . Sickle cell crisis   . Blood transfusion   . Avascular necrosis of hip     bilateral  . Infection of bone, shoulder region     left shoulder  . Pneumonia     Past Surgical History  Procedure Date  . Orif right hip fracture 1995  . Joint replacement 2006    right total hip arthroplasty    History  reviewed. No pertinent family history.  History  Substance Use Topics  . Smoking status: Current Everyday Smoker -- 0.5 packs/day    Types: Cigarettes  . Smokeless tobacco: Never Used  . Alcohol Use: Yes      Review of Systems  HENT: Negative for rhinorrhea.   Respiratory: Negative for cough.   Cardiovascular: Negative for chest pain.  Gastrointestinal: Negative for nausea, vomiting and diarrhea.  Musculoskeletal: Positive for joint pain.  Neurological: Negative for weakness.  All other systems reviewed and are negative.    Allergies  Review of patient's allergies indicates no known allergies.  Home Medications   Current Outpatient Rx  Name Route Sig Dispense Refill  . FOLIC ACID 1 MG PO TABS Oral Take 1 tablet (1 mg total) by mouth daily.    . MORPHINE SULFATE ER 60 MG PO TB12 Oral Take 1 tablet (60 mg total) by mouth 2 (two) times daily. 40 tablet 0  . MORPHINE SULFATE 30 MG PO TABS Oral Take 1 tablet (30 mg total) by mouth every 4 (four) hours as needed. Breakthrough pain 60 tablet 0  . PROMETHAZINE HCL 25 MG PO TABS Oral Take 1 tablet (25 mg total) by mouth every 6 (six) hours as needed. 40 tablet 0    BP 116/96  Pulse 81  Temp(Src) 98.3 F (36.8 C) (Oral)  Resp 16  SpO2 100%  Physical Exam  Nursing note and vitals reviewed. Constitutional: He is oriented to person, place, and time. He appears  well-developed and well-nourished. No distress.  HENT:  Head: Normocephalic and atraumatic.  Mouth/Throat: Oropharynx is clear and moist.  Eyes: Conjunctivae and EOM are normal. Pupils are equal, round, and reactive to light.  Neck: Normal range of motion. Neck supple.  Cardiovascular: Normal rate, regular rhythm and intact distal pulses.   No murmur heard. Pulmonary/Chest: Effort normal and breath sounds normal. No respiratory distress. He has no wheezes. He has no rales.  Abdominal: Soft. He exhibits no distension. There is no tenderness. There is no rebound and no  guarding.  Musculoskeletal: Normal range of motion. He exhibits tenderness. He exhibits no edema.       Pain with range of motion of the hips but no warmth, effusion of the hips  Neurological: He is alert and oriented to person, place, and time.  Skin: Skin is warm and dry. No rash noted. No erythema.  Psychiatric: He has a normal mood and affect. His behavior is normal.    ED Course  Procedures (including critical care time)  Labs Reviewed  CBC - Abnormal; Notable for the following:    RBC 3.46 (*)    Hemoglobin 11.5 (*)    HCT 31.7 (*)    MCHC 36.3 (*)    All other components within normal limits  RETICULOCYTES - Abnormal; Notable for the following:    RBC. 3.46 (*)    All other components within normal limits  DIFFERENTIAL - Abnormal; Notable for the following:    Eosinophils Relative 6 (*)    All other components within normal limits  BASIC METABOLIC PANEL   Dg Chest 2 View  08/20/2011  *RADIOLOGY REPORT*  Clinical Data: Sickle cell crisis.  Short of breath  CHEST - 2 VIEW  Comparison: 05/16/2011  Findings: Heart size is normal and the vascularity is  normal. Prominent lung markings due to scarring.  Negative for pneumonia or effusion.  IMPRESSION: Chronic lung disease.  No acute abnormality.  Original Report Authenticated By: Camelia Phenes, M.D.     Date: 08/21/2011  Rate: 63  Rhythm: normal sinus rhythm  QRS Axis: normal  Intervals: normal  ST/T Wave abnormalities: normal  Conduction Disutrbances: none  Narrative Interpretation: unremarkable       1. Sickle cell pain crisis       MDM   Patient with history of sickle cell disease he takes MS Contin and morphine tablets for his crisis who states he started having bilateral hip pain for the last 6 days which is worsening and not improved by his pain medication. He denies any fever or or chest symptoms concerning for acute chest. After one round of Dilaudid patient's pain is unchanged. However he states he wants  to try repeat doses of medication here and hopefully go home. Hemoglobin counts are stable and no other acute symptoms he does have a history of avascular necrosis of his hip but denies any swelling or pain to the hip and no recent trauma.       Gwyneth Sprout, MD 08/21/11 0002  Gwyneth Sprout, MD 08/21/11 0002

## 2011-08-20 NOTE — ED Notes (Addendum)
Patient complain of sickle cell pain in bilateral hips, lower ribs bilaterally with deep breaths. Pt reports pain began on Sunday, but worsened through week.

## 2011-08-21 ENCOUNTER — Encounter (HOSPITAL_COMMUNITY): Payer: Self-pay | Admitting: Internal Medicine

## 2011-08-21 LAB — COMPREHENSIVE METABOLIC PANEL
AST: 18 U/L (ref 0–37)
Alkaline Phosphatase: 72 U/L (ref 39–117)
CO2: 27 mEq/L (ref 19–32)
Calcium: 8.9 mg/dL (ref 8.4–10.5)
Creatinine, Ser: 0.78 mg/dL (ref 0.50–1.35)
Total Bilirubin: 1.7 mg/dL — ABNORMAL HIGH (ref 0.3–1.2)

## 2011-08-21 LAB — CBC
HCT: 27.5 % — ABNORMAL LOW (ref 39.0–52.0)
MCH: 32.9 pg (ref 26.0–34.0)
MCHC: 36 g/dL (ref 30.0–36.0)
MCV: 91.4 fL (ref 78.0–100.0)
Platelets: 299 10*3/uL (ref 150–400)
RDW: 14.6 % (ref 11.5–15.5)

## 2011-08-21 MED ORDER — DEXTROSE-NACL 5-0.45 % IV SOLN
INTRAVENOUS | Status: DC
  Administered 2011-08-21 – 2011-08-22 (×3): via INTRAVENOUS
  Administered 2011-08-22: 1000 mL via INTRAVENOUS
  Administered 2011-08-23 – 2011-08-24 (×4): via INTRAVENOUS
  Administered 2011-08-24: 75 mL/h via INTRAVENOUS
  Administered 2011-08-25 – 2011-08-26 (×2): via INTRAVENOUS

## 2011-08-21 MED ORDER — ENOXAPARIN SODIUM 40 MG/0.4ML ~~LOC~~ SOLN
40.0000 mg | SUBCUTANEOUS | Status: DC
  Administered 2011-08-21 – 2011-08-27 (×7): 40 mg via SUBCUTANEOUS
  Filled 2011-08-21 (×7): qty 0.4

## 2011-08-21 MED ORDER — HYDROMORPHONE HCL PF 2 MG/ML IJ SOLN
2.0000 mg | Freq: Once | INTRAMUSCULAR | Status: AC
  Administered 2011-08-21: 2 mg via INTRAVENOUS
  Filled 2011-08-21: qty 1

## 2011-08-21 MED ORDER — POLYETHYLENE GLYCOL 3350 17 G PO PACK
17.0000 g | PACK | Freq: Every day | ORAL | Status: DC
  Administered 2011-08-21 – 2011-08-24 (×3): 17 g via ORAL
  Filled 2011-08-21 (×4): qty 1

## 2011-08-21 MED ORDER — ONDANSETRON HCL 4 MG/2ML IJ SOLN: 4.0000 mg | Freq: Four times a day (QID) | INTRAMUSCULAR | Status: DC | PRN

## 2011-08-21 MED ORDER — ONDANSETRON HCL 4 MG PO TABS: 4.0000 mg | ORAL_TABLET | Freq: Four times a day (QID) | ORAL | Status: DC | PRN

## 2011-08-21 MED ORDER — HYDROXYUREA 500 MG PO CAPS
500.0000 mg | ORAL_CAPSULE | Freq: Two times a day (BID) | ORAL | Status: DC
  Administered 2011-08-21 – 2011-08-27 (×12): 500 mg via ORAL
  Filled 2011-08-21 (×14): qty 1

## 2011-08-21 MED ORDER — PROMETHAZINE HCL 25 MG PO TABS: 25.0000 mg | ORAL_TABLET | Freq: Four times a day (QID) | ORAL | Status: DC | PRN

## 2011-08-21 MED ORDER — DIPHENHYDRAMINE HCL 50 MG/ML IJ SOLN
12.5000 mg | INTRAMUSCULAR | Status: DC | PRN
  Administered 2011-08-21: 12.5 mg via INTRAVENOUS
  Administered 2011-08-25: 25 mg via INTRAVENOUS
  Filled 2011-08-21 (×2): qty 1

## 2011-08-21 MED ORDER — FOLIC ACID 1 MG PO TABS
1.0000 mg | ORAL_TABLET | Freq: Every day | ORAL | Status: DC
  Administered 2011-08-21 – 2011-08-27 (×7): 1 mg via ORAL
  Filled 2011-08-21 (×7): qty 1

## 2011-08-21 MED ORDER — LIDOCAINE 5 % EX PTCH
2.0000 | MEDICATED_PATCH | CUTANEOUS | Status: DC
  Administered 2011-08-22 – 2011-08-24 (×3): 2 via TRANSDERMAL
  Filled 2011-08-21 (×4): qty 2

## 2011-08-21 MED ORDER — MORPHINE SULFATE 30 MG PO TABS: 30.0000 mg | ORAL_TABLET | ORAL | Status: DC | PRN

## 2011-08-21 MED ORDER — HYDROMORPHONE HCL PF 1 MG/ML IJ SOLN
2.0000 mg | Freq: Once | INTRAMUSCULAR | Status: AC
  Administered 2011-08-21: 2 mg via INTRAVENOUS
  Filled 2011-08-21: qty 2

## 2011-08-21 MED ORDER — HYDROMORPHONE HCL PF 1 MG/ML IJ SOLN
2.0000 mg | INTRAMUSCULAR | Status: DC | PRN
  Administered 2011-08-21 (×2): 2 mg via INTRAVENOUS
  Filled 2011-08-21 (×2): qty 2

## 2011-08-21 MED ORDER — ACETAMINOPHEN 325 MG PO TABS: 650.0000 mg | ORAL_TABLET | Freq: Four times a day (QID) | ORAL | Status: DC | PRN

## 2011-08-21 MED ORDER — HYDROMORPHONE HCL PF 2 MG/ML IJ SOLN
2.0000 mg | INTRAMUSCULAR | Status: DC | PRN
  Administered 2011-08-21: 4 mg via INTRAVENOUS
  Administered 2011-08-21 (×3): 2 mg via INTRAVENOUS
  Administered 2011-08-22 – 2011-08-26 (×37): 4 mg via INTRAVENOUS
  Administered 2011-08-26: 2 mg via INTRAVENOUS
  Administered 2011-08-26 – 2011-08-27 (×5): 4 mg via INTRAVENOUS
  Filled 2011-08-21: qty 4
  Filled 2011-08-21: qty 2
  Filled 2011-08-21: qty 4
  Filled 2011-08-21: qty 2
  Filled 2011-08-21: qty 4
  Filled 2011-08-21: qty 2
  Filled 2011-08-21: qty 4
  Filled 2011-08-21 (×2): qty 2
  Filled 2011-08-21 (×5): qty 4
  Filled 2011-08-21: qty 2
  Filled 2011-08-21: qty 4
  Filled 2011-08-21 (×8): qty 2
  Filled 2011-08-21: qty 4
  Filled 2011-08-21 (×7): qty 2
  Filled 2011-08-21: qty 4
  Filled 2011-08-21 (×2): qty 2
  Filled 2011-08-21: qty 4
  Filled 2011-08-21: qty 2
  Filled 2011-08-21: qty 4
  Filled 2011-08-21 (×5): qty 2
  Filled 2011-08-21: qty 4
  Filled 2011-08-21 (×5): qty 2
  Filled 2011-08-21: qty 1

## 2011-08-21 MED ORDER — SODIUM CHLORIDE 0.9 % IV SOLN
INTRAVENOUS | Status: DC
  Administered 2011-08-21: 11:00:00 via INTRAVENOUS

## 2011-08-21 MED ORDER — PROMETHAZINE HCL 25 MG/ML IJ SOLN
12.5000 mg | INTRAMUSCULAR | Status: DC | PRN
  Administered 2011-08-21 – 2011-08-22 (×3): 12.5 mg via INTRAVENOUS
  Administered 2011-08-22 – 2011-08-26 (×15): 25 mg via INTRAVENOUS
  Administered 2011-08-26: 12.5 mg via INTRAVENOUS
  Administered 2011-08-26 – 2011-08-27 (×5): 25 mg via INTRAVENOUS
  Filled 2011-08-21 (×24): qty 1

## 2011-08-21 MED ORDER — ACETAMINOPHEN 650 MG RE SUPP: 650.0000 mg | Freq: Four times a day (QID) | RECTAL | Status: DC | PRN

## 2011-08-21 MED ORDER — LIDOCAINE 5 % EX PTCH
2.0000 | MEDICATED_PATCH | Freq: Once | CUTANEOUS | Status: AC
  Administered 2011-08-21: 2 via TRANSDERMAL
  Filled 2011-08-21: qty 2

## 2011-08-21 MED ORDER — NICOTINE 14 MG/24HR TD PT24
14.0000 mg | MEDICATED_PATCH | Freq: Every day | TRANSDERMAL | Status: DC
  Administered 2011-08-21 – 2011-08-27 (×7): 14 mg via TRANSDERMAL
  Filled 2011-08-21 (×7): qty 1

## 2011-08-21 MED ORDER — KETOROLAC TROMETHAMINE 15 MG/ML IJ SOLN
15.0000 mg | Freq: Three times a day (TID) | INTRAMUSCULAR | Status: DC
  Administered 2011-08-21 – 2011-08-24 (×9): 15 mg via INTRAVENOUS
  Filled 2011-08-21 (×14): qty 1

## 2011-08-21 MED ORDER — MORPHINE SULFATE CR 30 MG PO TB12
60.0000 mg | ORAL_TABLET | Freq: Two times a day (BID) | ORAL | Status: DC
  Administered 2011-08-21 – 2011-08-27 (×13): 60 mg via ORAL
  Filled 2011-08-21 (×8): qty 2

## 2011-08-21 NOTE — Progress Notes (Signed)
Subjective: The patient was seen on rounds today.  The patient was laying in his bed quietly. The patient states his pain is a 8/10 in his back and bilateral hip areas. The patient stated the recent fluctuations in the weather is what he believes precipitated his current crisis.  The patient states that he has had some RUE intermittent numbness since receiving a PICC line during his last hospitalization.  The patient declined PT for the numbness.  Will avoid blood exchanges at this time, thereby avoiding the need for a PICC line.  The patient also states that Zofran is not working for his nausea - I will switch the patient to Phenergan. The patient denies any other symptomatology.  No other patient or nursing concerns.   Objective: Vital signs in last 24 hours: Blood pressure 122/83, pulse 59, temperature 97.7 F (36.5 C), temperature source Oral, resp. rate 16, height 6\' 2"  (1.88 m), weight 180 lb (81.647 kg), SpO2 98.00%.  General Appearance: Alert, cooperative, well developed, mild distress Head: Normocephalic, without obvious abnormality, atraumatic Eyes: PERRLA, EOMI, scleral icterus Nose: Nares, septum and mucosa are normal, no drainage or sinus tenderness Throat: Lips, mucosa, and tongue normal; teeth and gums normal Neck: No adenopathy, supple, symmetrical, trachea midline,thyroid not enlarged, symmetric, no tenderness/mass/nodules Back: Symmetric, impaired ROM, bilateral CVA tenderness Resp: Diminished breath sounds bibasilar, CTA, no wheezes/rales/rhonchi Cardio: Regular rate and rhythm, S1, S2 normal, distant heart sounds,  no murmur, click, rub or gallop GI: Soft, non-tender, non-distended, hypoactive bowel sounds, no masses, no organomegaly Male Genitalia: Deferred, the patient denies priapism Extremities: Normal, atraumatic, no cyanosis, no edema, Homans sign is negative, no sign of DVT, limited ROM bilateral LEs Pulses: 2+ and symmetric Skin: Skin color, texture, turgor normal,  No rashes or lesions, LLE hyperpigmentation, well healed scars on trunk and extremities  Neurologic: Grossly normal, AO x 3, CN II - XII grossly intact, no focal deficits Psych:  Appropriate affect  Lab Results: Results for orders placed during the hospital encounter of 08/20/11 (from the past 48 hour(s))  CBC     Status: Abnormal   Collection Time   08/20/11  8:53 PM      Component Value Range Comment   WBC 10.0  4.0 - 10.5 (K/uL)    RBC 3.46 (*) 4.22 - 5.81 (MIL/uL)    Hemoglobin 11.5 (*) 13.0 - 17.0 (g/dL)    HCT 53.6 (*) 64.4 - 52.0 (%)    MCV 91.6  78.0 - 100.0 (fL)    MCH 33.2  26.0 - 34.0 (pg)    MCHC 36.3 (*) 30.0 - 36.0 (g/dL)    RDW 03.4  74.2 - 59.5 (%)    Platelets 316  150 - 400 (K/uL)   BASIC METABOLIC PANEL     Status: Normal   Collection Time   08/20/11  8:53 PM      Component Value Range Comment   Sodium 139  135 - 145 (mEq/L)    Potassium 3.8  3.5 - 5.1 (mEq/L)    Chloride 104  96 - 112 (mEq/L)    CO2 26  19 - 32 (mEq/L)    Glucose, Bld 89  70 - 99 (mg/dL)    BUN 13  6 - 23 (mg/dL)    Creatinine, Ser 6.38  0.50 - 1.35 (mg/dL)    Calcium 9.3  8.4 - 10.5 (mg/dL)    GFR calc non Af Amer >90  >90 (mL/min)    GFR calc Af Amer >90  >90 (  mL/min)   RETICULOCYTES     Status: Abnormal   Collection Time   08/20/11  8:53 PM      Component Value Range Comment   Retic Ct Pct 3.1  0.4 - 3.1 (%)    RBC. 3.46 (*) 4.22 - 5.81 (MIL/uL)    Retic Count, Manual 107.3  19.0 - 186.0 (K/uL)   DIFFERENTIAL     Status: Abnormal   Collection Time   08/20/11  8:53 PM      Component Value Range Comment   Neutrophils Relative 54  43 - 77 (%)    Neutro Abs 5.4  1.7 - 7.7 (K/uL)    Lymphocytes Relative 30  12 - 46 (%)    Lymphs Abs 3.0  0.7 - 4.0 (K/uL)    Monocytes Relative 10  3 - 12 (%)    Monocytes Absolute 1.0  0.1 - 1.0 (K/uL)    Eosinophils Relative 6 (*) 0 - 5 (%)    Eosinophils Absolute 0.6  0.0 - 0.7 (K/uL)    Basophils Relative 1  0 - 1 (%)    Basophils Absolute 0.1  0.0  - 0.1 (K/uL)   COMPREHENSIVE METABOLIC PANEL     Status: Abnormal   Collection Time   08/21/11  6:45 AM      Component Value Range Comment   Sodium 137  135 - 145 (mEq/L)    Potassium 3.9  3.5 - 5.1 (mEq/L)    Chloride 105  96 - 112 (mEq/L)    CO2 27  19 - 32 (mEq/L)    Glucose, Bld 94  70 - 99 (mg/dL)    BUN 10  6 - 23 (mg/dL)    Creatinine, Ser 1.61  0.50 - 1.35 (mg/dL)    Calcium 8.9  8.4 - 10.5 (mg/dL)    Total Protein 7.0  6.0 - 8.3 (g/dL)    Albumin 3.7  3.5 - 5.2 (g/dL)    AST 18  0 - 37 (U/L)    ALT 9  0 - 53 (U/L)    Alkaline Phosphatase 72  39 - 117 (U/L)    Total Bilirubin 1.7 (*) 0.3 - 1.2 (mg/dL)    GFR calc non Af Amer >90  >90 (mL/min)    GFR calc Af Amer >90  >90 (mL/min)   CBC     Status: Abnormal   Collection Time   08/21/11  6:45 AM      Component Value Range Comment   WBC 10.4  4.0 - 10.5 (K/uL)    RBC 3.01 (*) 4.22 - 5.81 (MIL/uL)    Hemoglobin 9.9 (*) 13.0 - 17.0 (g/dL)    HCT 09.6 (*) 04.5 - 52.0 (%)    MCV 91.4  78.0 - 100.0 (fL)    MCH 32.9  26.0 - 34.0 (pg)    MCHC 36.0  30.0 - 36.0 (g/dL)    RDW 40.9  81.1 - 91.4 (%)    Platelets 299  150 - 400 (K/uL)     Studies/Results: Dg Chest 2 View  08/20/2011  *RADIOLOGY REPORT*  Clinical Data: Sickle cell crisis.  Short of breath  CHEST - 2 VIEW  Comparison: 05/16/2011  Findings: Heart size is normal and the vascularity is  normal. Prominent lung markings due to scarring.  Negative for pneumonia or effusion.  IMPRESSION: Chronic lung disease.  No acute abnormality.  Original Report Authenticated By: Camelia Phenes, M.D.    Medications: No Known Allergies  Current Facility-Administered Medications  Medication Dose Route Frequency Provider Last Rate Last Dose  . acetaminophen (TYLENOL) tablet 650 mg  650 mg Oral Q6H PRN Eduard Clos, MD       Or  . acetaminophen (TYLENOL) suppository 650 mg  650 mg Rectal Q6H PRN Eduard Clos, MD      . dextrose 5 %-0.45 % sodium chloride infusion    Intravenous Continuous Keitha Butte, NP 100 mL/hr at 08/21/11 1212    . diphenhydrAMINE (BENADRYL) injection 12.5-25 mg  12.5-25 mg Intravenous Q4H PRN Keitha Butte, NP      . enoxaparin (LOVENOX) injection 40 mg  40 mg Subcutaneous Q24H Keitha Butte, NP      . folic acid (FOLVITE) tablet 1 mg  1 mg Oral Daily Eduard Clos, MD   1 mg at 08/21/11 0947  . HYDROmorphone (DILAUDID) injection 2-4 mg  2-4 mg Intravenous Q2H PRN Keitha Butte, NP      . hydroxyurea (HYDREA) capsule 500 mg  500 mg Oral BID Keitha Butte, NP      . ketorolac (TORADOL) 15 MG/ML injection 15 mg  15 mg Intravenous Q8H Keitha Butte, NP      . lidocaine (LIDODERM) 5 % 2 patch  2 patch Transdermal Q24H Keitha Butte, NP      . morphine (MS CONTIN) 12 hr tablet 60 mg  60 mg Oral BID Eduard Clos, MD   60 mg at 08/21/11 0947  . morphine (MSIR) tablet 30 mg  30 mg Oral Q4H PRN Eduard Clos, MD      . nicotine (NICODERM CQ - dosed in mg/24 hours) patch 14 mg  14 mg Transdermal Daily Keitha Butte, NP      . polyethylene glycol (MIRALAX / GLYCOLAX) packet 17 g  17 g Oral Daily Keitha Butte, NP      . promethazine (PHENERGAN) injection 12.5-25 mg  12.5-25 mg Intravenous Q4H PRN Keitha Butte, NP      . promethazine (PHENERGAN) tablet 25 mg  25 mg Oral Q6H PRN Eduard Clos, MD      . DISCONTD: 0.9 %  sodium chloride infusion   Intravenous Continuous Eduard Clos, MD 125 mL/hr at 08/21/11 1050    . DISCONTD: HYDROmorphone (DILAUDID) injection 2 mg  2 mg Intravenous Q3H PRN Eduard Clos, MD   2 mg at 08/21/11 1050  . DISCONTD: ondansetron (ZOFRAN) injection 4 mg  4 mg Intravenous Q6H PRN Eduard Clos, MD      . DISCONTD: ondansetron (ZOFRAN) tablet 4 mg  4 mg Oral Q6H PRN Eduard Clos, MD        Assessment/Plan: Patient Active Problem List  Diagnoses  . Sickle cell anemia with crisis  . Constipation  . Bronchitis   . Acute chest syndrome  . Tobacco abuse   Sickle Cell Crisis:  The patient will continue receiving pain/nausea/pruritis management, IV hydration, anti-inflammatory therapy, Hydroxyurea, Folic Acid and DVT prophylaxis.  CMP, ProBNP, Hemoglobinopathy, Ferritin, Mg, Phos and CBC in the am.   Tobacco Abuse:   The patient is receiving  a nicotine patch daily.  The patient did receive smoking cessation counseling today.  Discussed and agreed upon plan of care with the patient.   The plan of care will be adjusted based on the patient's clinical progress.   Larina Bras, NP-C 08/21/2011, 11:54 AM

## 2011-08-21 NOTE — ED Notes (Signed)
Reported to [facility] that patient will be coming to floor.

## 2011-08-21 NOTE — ED Notes (Signed)
RN upstairs requested patient to be held in ED until seen by hospitalist. Charge nurse [facility] notified and stated it was ok to hold patient.

## 2011-08-21 NOTE — H&P (Signed)
[name] is an 36 y.o. male.   PCP - Dr.Edwin Avbuerre Chief Complaint: Pain. HPI: 36 year old male with known history of sickle cell disease presented to the ER because of increasing pain in his hip and low back for last 3-4 days. He denies any shortness of breath fever cough or chest pain. Patient has received multiple doses of pain rate medications despite which he still has pain and has been admitted for further pain management. Patient otherwise denies any nausea vomiting abdominal pain any headache or focal deficits.  Past Medical History  Diagnosis Date  . Sickle cell crisis   . Blood transfusion   . Avascular necrosis of hip     bilateral  . Infection of bone, shoulder region     left shoulder  . Pneumonia     Past Surgical History  Procedure Date  . Orif right hip fracture 1995  . Joint replacement 2006    right total hip arthroplasty    History reviewed. No pertinent family history. Social History:  reports that he has been smoking Cigarettes.  He has been smoking about .5 packs per day. He has never used smokeless tobacco. He reports that he drinks alcohol. He reports that he does not use illicit drugs.  Allergies: No Known Allergies   (Not in a hospital admission)  Results for orders placed during the hospital encounter of 08/20/11 (from the past 48 hour(s))  CBC     Status: Abnormal   Collection Time   08/20/11  8:53 PM      Component Value Range Comment   WBC 10.0  4.0 - 10.5 (K/uL)    RBC 3.46 (*) 4.22 - 5.81 (MIL/uL)    Hemoglobin 11.5 (*) 13.0 - 17.0 (g/dL)    HCT 16.1 (*) 09.6 - 52.0 (%)    MCV 91.6  78.0 - 100.0 (fL)    MCH 33.2  26.0 - 34.0 (pg)    MCHC 36.3 (*) 30.0 - 36.0 (g/dL)    RDW 04.5  40.9 - 81.1 (%)    Platelets 316  150 - 400 (K/uL)   BASIC METABOLIC PANEL     Status: Normal   Collection Time   08/20/11  8:53 PM      Component Value Range Comment   Sodium 139  135 - 145 (mEq/L)    Potassium 3.8  3.5 - 5.1 (mEq/L)    Chloride 104   96 - 112 (mEq/L)    CO2 26  19 - 32 (mEq/L)    Glucose, Bld 89  70 - 99 (mg/dL)    BUN 13  6 - 23 (mg/dL)    Creatinine, Ser 9.14  0.50 - 1.35 (mg/dL)    Calcium 9.3  8.4 - 10.5 (mg/dL)    GFR calc non Af Amer >90  >90 (mL/min)    GFR calc Af Amer >90  >90 (mL/min)   RETICULOCYTES     Status: Abnormal   Collection Time   08/20/11  8:53 PM      Component Value Range Comment   Retic Ct Pct 3.1  0.4 - 3.1 (%)    RBC. 3.46 (*) 4.22 - 5.81 (MIL/uL)    Retic Count, Manual 107.3  19.0 - 186.0 (K/uL)   DIFFERENTIAL     Status: Abnormal   Collection Time   08/20/11  8:53 PM      Component Value Range Comment   Neutrophils Relative 54  43 - 77 (%)    Neutro Abs 5.4  1.7 - 7.7 (K/uL)    Lymphocytes Relative 30  12 - 46 (%)    Lymphs Abs 3.0  0.7 - 4.0 (K/uL)    Monocytes Relative 10  3 - 12 (%)    Monocytes Absolute 1.0  0.1 - 1.0 (K/uL)    Eosinophils Relative 6 (*) 0 - 5 (%)    Eosinophils Absolute 0.6  0.0 - 0.7 (K/uL)    Basophils Relative 1  0 - 1 (%)    Basophils Absolute 0.1  0.0 - 0.1 (K/uL)    Dg Chest 2 View  08/20/2011  *RADIOLOGY REPORT*  Clinical Data: Sickle cell crisis.  Short of breath  CHEST - 2 VIEW  Comparison: 05/16/2011  Findings: Heart size is normal and the vascularity is  normal. Prominent lung markings due to scarring.  Negative for pneumonia or effusion.  IMPRESSION: Chronic lung disease.  No acute abnormality.  Original Report Authenticated By: Camelia Phenes, M.D.    Review of Systems  Constitutional: Negative.   HENT: Negative.   Eyes: Negative.   Respiratory: Negative.   Cardiovascular: Negative.   Gastrointestinal: Negative.   Genitourinary: Negative.   Musculoskeletal: Positive for back pain and joint pain.  Skin: Negative.   Neurological: Negative.   Endo/Heme/Allergies: Negative.   Psychiatric/Behavioral: Negative.     Blood pressure 105/55, pulse 60, temperature 98.3 F (36.8 C), temperature source Oral, resp. rate 13, SpO2 97.00%. Physical  Exam  Constitutional: He is oriented to person, place, and time. He appears well-developed and well-nourished. No distress.  HENT:  Head: Normocephalic and atraumatic.  Right Ear: External ear normal.  Left Ear: External ear normal.  Mouth/Throat: No oropharyngeal exudate.  Eyes: Conjunctivae are normal. Pupils are equal, round, and reactive to light. Right eye exhibits no discharge. Left eye exhibits no discharge.  Neck: Normal range of motion. Neck supple.  Cardiovascular: Normal rate and regular rhythm.   Respiratory: Effort normal and breath sounds normal. No respiratory distress. He has no wheezes. He has no rales.  GI: Soft. Bowel sounds are normal. He exhibits no distension. There is no tenderness. There is no rebound.  Musculoskeletal: Normal range of motion. He exhibits no edema and no tenderness.  Neurological: He is alert and oriented to person, place, and time.       Moves all extremities.  Skin: Skin is warm and dry. He is not diaphoretic.  Psychiatric: His behavior is normal.     Assessment/Plan #1. Sickle cell anemia with pain crisis - continue his home medications. IV hydration. Dilaudid when necessary IV. #2. Tobacco abuse - advised patient to quit smoking.  CODE STATUS - full code.  Lamisha Roussell N. 08/21/2011, 6:00 AM

## 2011-08-22 LAB — COMPREHENSIVE METABOLIC PANEL
ALT: 10 U/L (ref 0–53)
BUN: 8 mg/dL (ref 6–23)
CO2: 27 mEq/L (ref 19–32)
Calcium: 9.1 mg/dL (ref 8.4–10.5)
Creatinine, Ser: 0.83 mg/dL (ref 0.50–1.35)
GFR calc Af Amer: >90 mL/min (ref >90)
GFR calc non Af Amer: >90 mL/min (ref >90)
Glucose, Bld: 81 mg/dL (ref 70–99)
Total Protein: 7.4 g/dL (ref 6.0–8.3)

## 2011-08-22 LAB — CBC
Hemoglobin: 10.3 g/dL — ABNORMAL LOW (ref 13.0–17.0)
MCHC: 36.4 g/dL — ABNORMAL HIGH (ref 30.0–36.0)
RBC: 3.08 MIL/uL — ABNORMAL LOW (ref 4.22–5.81)
WBC: 11.1 10*3/uL — ABNORMAL HIGH (ref 4.0–10.5)

## 2011-08-22 LAB — PRO B NATRIURETIC PEPTIDE: Pro B Natriuretic peptide (BNP): 236 pg/mL — ABNORMAL HIGH (ref 0–125)

## 2011-08-22 NOTE — Progress Notes (Signed)
Subjective:  Patient continues with pain in his back and legs. Pain still at 8-9/10. No new complaints. No recent bowel movements. No nausea vomiting. He is followed normally by Dr. Concepcion Elk.   No Known Allergies Current Facility-Administered Medications  Medication Dose Route Frequency Provider Last Rate Last Dose  . acetaminophen (TYLENOL) tablet 650 mg  650 mg Oral Q6H PRN Eduard Clos, MD       Or  . acetaminophen (TYLENOL) suppository 650 mg  650 mg Rectal Q6H PRN Eduard Clos, MD      . dextrose 5 %-0.45 % sodium chloride infusion   Intravenous Continuous Keitha Butte, NP 100 mL/hr at 08/22/11 9811    . diphenhydrAMINE (BENADRYL) injection 12.5-25 mg  12.5-25 mg Intravenous Q4H PRN Keitha Butte, NP   12.5 mg at 08/21/11 1242  . enoxaparin (LOVENOX) injection 40 mg  40 mg Subcutaneous Q24H Keitha Butte, NP   40 mg at 08/22/11 0946  . folic acid (FOLVITE) tablet 1 mg  1 mg Oral Daily Eduard Clos, MD   1 mg at 08/22/11 0946  . HYDROmorphone (DILAUDID) injection 2-4 mg  2-4 mg Intravenous Q2H PRN Keitha Butte, NP   4 mg at 08/22/11 1423  . hydroxyurea (HYDREA) capsule 500 mg  500 mg Oral BID Keitha Butte, NP   500 mg at 08/22/11 0830  . ketorolac (TORADOL) 15 MG/ML injection 15 mg  15 mg Intravenous Q8H Keitha Butte, NP   15 mg at 08/22/11 1423  . lidocaine (LIDODERM) 5 % 2 patch  2 patch Transdermal Q24H Keitha Butte, NP   2 patch at 08/22/11 1146  . lidocaine (LIDODERM) 5 % 2 patch  2 patch Transdermal Once Gwenyth Bender, MD   2 patch at 08/21/11 1256  . morphine (MS CONTIN) 12 hr tablet 60 mg  60 mg Oral BID Eduard Clos, MD   60 mg at 08/22/11 0946  . morphine (MSIR) tablet 30 mg  30 mg Oral Q4H PRN Eduard Clos, MD      . nicotine (NICODERM CQ - dosed in mg/24 hours) patch 14 mg  14 mg Transdermal Daily Keitha Butte, NP   14 mg at 08/22/11 0947  . polyethylene glycol (MIRALAX / GLYCOLAX) packet 17 g   17 g Oral Daily Keitha Butte, NP   17 g at 08/22/11 0950  . promethazine (PHENERGAN) injection 12.5-25 mg  12.5-25 mg Intravenous Q4H PRN Keitha Butte, NP   25 mg at 08/22/11 0828  . promethazine (PHENERGAN) tablet 25 mg  25 mg Oral Q6H PRN Eduard Clos, MD        Objective: Blood pressure 122/90, pulse 86, temperature 98.3 F (36.8 C), temperature source Oral, resp. rate 16, height 6\' 2"  (1.88 m), weight 180 lb (81.647 kg), SpO2 98.00%.  WDWN black male in  No acute distress. HEENT:no sinus tenderness. NECK:no posterior cervical nodes. LUNGS: clear to auscultation and percussion. No CVAT. BJ:YNWGNF S1, S2 without S3 AOZ:HYQMVH lower lumbosacral spine. Negative homan's. NEURO:intact.  Lab results: Results for orders placed during the hospital encounter of 08/20/11 (from the past 48 hour(s))  CBC     Status: Abnormal   Collection Time   08/20/11  8:53 PM      Component Value Range Comment   WBC 10.0  4.0 - 10.5 (K/uL)    RBC 3.46 (*) 4.22 - 5.81 (MIL/uL)    Hemoglobin 11.5 (*) 13.0 - 17.0 (g/dL)  HCT 31.7 (*) 39.0 - 52.0 (%)    MCV 91.6  78.0 - 100.0 (fL)    MCH 33.2  26.0 - 34.0 (pg)    MCHC 36.3 (*) 30.0 - 36.0 (g/dL)    RDW 16.1  09.6 - 04.5 (%)    Platelets 316  150 - 400 (K/uL)   BASIC METABOLIC PANEL     Status: Normal   Collection Time   08/20/11  8:53 PM      Component Value Range Comment   Sodium 139  135 - 145 (mEq/L)    Potassium 3.8  3.5 - 5.1 (mEq/L)    Chloride 104  96 - 112 (mEq/L)    CO2 26  19 - 32 (mEq/L)    Glucose, Bld 89  70 - 99 (mg/dL)    BUN 13  6 - 23 (mg/dL)    Creatinine, Ser 4.09  0.50 - 1.35 (mg/dL)    Calcium 9.3  8.4 - 10.5 (mg/dL)    GFR calc non Af Amer >90  >90 (mL/min)    GFR calc Af Amer >90  >90 (mL/min)   RETICULOCYTES     Status: Abnormal   Collection Time   08/20/11  8:53 PM      Component Value Range Comment   Retic Ct Pct 3.1  0.4 - 3.1 (%)    RBC. 3.46 (*) 4.22 - 5.81 (MIL/uL)    Retic Count, Manual  107.3  19.0 - 186.0 (K/uL)   DIFFERENTIAL     Status: Abnormal   Collection Time   08/20/11  8:53 PM      Component Value Range Comment   Neutrophils Relative 54  43 - 77 (%)    Neutro Abs 5.4  1.7 - 7.7 (K/uL)    Lymphocytes Relative 30  12 - 46 (%)    Lymphs Abs 3.0  0.7 - 4.0 (K/uL)    Monocytes Relative 10  3 - 12 (%)    Monocytes Absolute 1.0  0.1 - 1.0 (K/uL)    Eosinophils Relative 6 (*) 0 - 5 (%)    Eosinophils Absolute 0.6  0.0 - 0.7 (K/uL)    Basophils Relative 1  0 - 1 (%)    Basophils Absolute 0.1  0.0 - 0.1 (K/uL)   COMPREHENSIVE METABOLIC PANEL     Status: Abnormal   Collection Time   08/21/11  6:45 AM      Component Value Range Comment   Sodium 137  135 - 145 (mEq/L)    Potassium 3.9  3.5 - 5.1 (mEq/L)    Chloride 105  96 - 112 (mEq/L)    CO2 27  19 - 32 (mEq/L)    Glucose, Bld 94  70 - 99 (mg/dL)    BUN 10  6 - 23 (mg/dL)    Creatinine, Ser 8.11  0.50 - 1.35 (mg/dL)    Calcium 8.9  8.4 - 10.5 (mg/dL)    Total Protein 7.0  6.0 - 8.3 (g/dL)    Albumin 3.7  3.5 - 5.2 (g/dL)    AST 18  0 - 37 (U/L)    ALT 9  0 - 53 (U/L)    Alkaline Phosphatase 72  39 - 117 (U/L)    Total Bilirubin 1.7 (*) 0.3 - 1.2 (mg/dL)    GFR calc non Af Amer >90  >90 (mL/min)    GFR calc Af Amer >90  >90 (mL/min)   CBC     Status: Abnormal   Collection Time  08/21/11  6:45 AM      Component Value Range Comment   WBC 10.4  4.0 - 10.5 (K/uL)    RBC 3.01 (*) 4.22 - 5.81 (MIL/uL)    Hemoglobin 9.9 (*) 13.0 - 17.0 (g/dL)    HCT 16.1 (*) 09.6 - 52.0 (%)    MCV 91.4  78.0 - 100.0 (fL)    MCH 32.9  26.0 - 34.0 (pg)    MCHC 36.0  30.0 - 36.0 (g/dL)    RDW 04.5  40.9 - 81.1 (%)    Platelets 299  150 - 400 (K/uL)   PHOSPHORUS     Status: Abnormal   Collection Time   08/22/11  7:05 AM      Component Value Range Comment   Phosphorus 4.7 (*) 2.3 - 4.6 (mg/dL)   MAGNESIUM     Status: Normal   Collection Time   08/22/11  7:05 AM      Component Value Range Comment   Magnesium 2.2  1.5 - 2.5  (mg/dL)   COMPREHENSIVE METABOLIC PANEL     Status: Abnormal   Collection Time   08/22/11  7:05 AM      Component Value Range Comment   Sodium 139  135 - 145 (mEq/L)    Potassium 4.1  3.5 - 5.1 (mEq/L)    Chloride 104  96 - 112 (mEq/L)    CO2 27  19 - 32 (mEq/L)    Glucose, Bld 81  70 - 99 (mg/dL)    BUN 8  6 - 23 (mg/dL)    Creatinine, Ser 9.14  0.50 - 1.35 (mg/dL)    Calcium 9.1  8.4 - 10.5 (mg/dL)    Total Protein 7.4  6.0 - 8.3 (g/dL)    Albumin 4.1  3.5 - 5.2 (g/dL)    AST 19  0 - 37 (U/L)    ALT 10  0 - 53 (U/L)    Alkaline Phosphatase 77  39 - 117 (U/L)    Total Bilirubin 2.1 (*) 0.3 - 1.2 (mg/dL)    GFR calc non Af Amer >90  >90 (mL/min)    GFR calc Af Amer >90  >90 (mL/min)   CBC     Status: Abnormal   Collection Time   08/22/11  7:05 AM      Component Value Range Comment   WBC 11.1 (*) 4.0 - 10.5 (K/uL)    RBC 3.08 (*) 4.22 - 5.81 (MIL/uL)    Hemoglobin 10.3 (*) 13.0 - 17.0 (g/dL)    HCT 78.2 (*) 95.6 - 52.0 (%)    MCV 91.9  78.0 - 100.0 (fL)    MCH 33.4  26.0 - 34.0 (pg)    MCHC 36.4 (*) 30.0 - 36.0 (g/dL)    RDW 21.3  08.6 - 57.8 (%)    Platelets 252  150 - 400 (K/uL)   PRO B NATRIURETIC PEPTIDE     Status: Abnormal   Collection Time   08/22/11  7:05 AM      Component Value Range Comment   Pro B Natriuretic peptide (BNP) 236.0 (*) 0 - 125 (pg/mL)     Studies/Results: Dg Chest 2 View  08/20/2011  *RADIOLOGY REPORT*  Clinical Data: Sickle cell crisis.  Short of breath  CHEST - 2 VIEW  Comparison: 05/16/2011  Findings: Heart size is normal and the vascularity is  normal. Prominent lung markings due to scarring.  Negative for pneumonia or effusion.  IMPRESSION: Chronic lung disease.  No acute abnormality.  Original Report Authenticated By: Camelia Phenes, M.D.    Patient Active Problem List  Diagnoses  . Sickle cell anemia with crisis  . Constipation  . Bronchitis  . Acute chest syndrome  . Tobacco abuse    Impression: Sickle cell crisis. Persists. Sickle  cell lung disease. Patient's pro BNP is elevated. History of tobacco abuse.    Plan: Continue therapy. Incentive spirometry therapy. Evaluate for exchange transfusion in am if no improvement in pain level.   August Saucer, Sinaya Minogue 08/22/2011 3:24 PM

## 2011-08-23 LAB — COMPREHENSIVE METABOLIC PANEL
Alkaline Phosphatase: 80 U/L (ref 39–117)
BUN: 8 mg/dL (ref 6–23)
Creatinine, Ser: 0.89 mg/dL (ref 0.50–1.35)
GFR calc Af Amer: >90 mL/min (ref >90)
Glucose, Bld: 86 mg/dL (ref 70–99)
Potassium: 4.3 mEq/L (ref 3.5–5.1)
Total Bilirubin: 2.1 mg/dL — ABNORMAL HIGH (ref 0.3–1.2)
Total Protein: 7.7 g/dL (ref 6.0–8.3)

## 2011-08-23 LAB — CBC
HCT: 29.3 % — ABNORMAL LOW (ref 39.0–52.0)
Platelets: 249 10*3/uL (ref 150–400)
RBC: 3.21 MIL/uL — ABNORMAL LOW (ref 4.22–5.81)
RDW: 15.4 % (ref 11.5–15.5)
WBC: 12.3 10*3/uL — ABNORMAL HIGH (ref 4.0–10.5)

## 2011-08-23 MED ORDER — LACTULOSE 10 GM/15ML PO SOLN
30.0000 g | Freq: Two times a day (BID) | ORAL | Status: DC | PRN
  Administered 2011-08-23: 30 g via ORAL
  Filled 2011-08-23 (×2): qty 45

## 2011-08-23 NOTE — Progress Notes (Signed)
Subjective:  Patient rates pain as 8/10. No improvement. Discussed exchange transfusion. Patient does not have a PICC line in. He would like to wait another day to see if he improves. Weather changed today. No BM since 3 days ago. No other new complaints. Discussed smoking cessation.   No Known Allergies Current Facility-Administered Medications  Medication Dose Route Frequency Provider Last Rate Last Dose  . acetaminophen (TYLENOL) tablet 650 mg  650 mg Oral Q6H PRN Eduard Clos, MD       Or  . acetaminophen (TYLENOL) suppository 650 mg  650 mg Rectal Q6H PRN Eduard Clos, MD      . dextrose 5 %-0.45 % sodium chloride infusion   Intravenous Continuous Keitha Butte, NP 100 mL/hr at 08/23/11 0421    . diphenhydrAMINE (BENADRYL) injection 12.5-25 mg  12.5-25 mg Intravenous Q4H PRN Keitha Butte, NP   12.5 mg at 08/21/11 1242  . enoxaparin (LOVENOX) injection 40 mg  40 mg Subcutaneous Q24H Keitha Butte, NP   40 mg at 08/23/11 0935  . folic acid (FOLVITE) tablet 1 mg  1 mg Oral Daily Eduard Clos, MD   1 mg at 08/23/11 0932  . HYDROmorphone (DILAUDID) injection 2-4 mg  2-4 mg Intravenous Q2H PRN Keitha Butte, NP   4 mg at 08/23/11 0416  . hydroxyurea (HYDREA) capsule 500 mg  500 mg Oral BID Keitha Butte, NP   500 mg at 08/23/11 0842  . ketorolac (TORADOL) 15 MG/ML injection 15 mg  15 mg Intravenous Q8H Keitha Butte, NP   15 mg at 08/23/11 0517  . lactulose (CHRONULAC) 10 GM/15ML solution 30 g  30 g Oral BID PRN Gwenyth Bender, MD      . lidocaine (LIDODERM) 5 % 2 patch  2 patch Transdermal Q24H Keitha Butte, NP   2 patch at 08/22/11 1146  . morphine (MS CONTIN) 12 hr tablet 60 mg  60 mg Oral BID Eduard Clos, MD   60 mg at 08/23/11 0932  . morphine (MSIR) tablet 30 mg  30 mg Oral Q4H PRN Eduard Clos, MD      . nicotine (NICODERM CQ - dosed in mg/24 hours) patch 14 mg  14 mg Transdermal Daily Keitha Butte, NP    14 mg at 08/23/11 0933  . polyethylene glycol (MIRALAX / GLYCOLAX) packet 17 g  17 g Oral Daily Keitha Butte, NP   17 g at 08/22/11 0950  . promethazine (PHENERGAN) injection 12.5-25 mg  12.5-25 mg Intravenous Q4H PRN Keitha Butte, NP   25 mg at 08/23/11 0927  . promethazine (PHENERGAN) tablet 25 mg  25 mg Oral Q6H PRN Eduard Clos, MD        Objective: Blood pressure 143/91, pulse 71, temperature 98.7 F (37.1 C), temperature source Oral, resp. rate 18, height 6\' 2"  (1.88 m), weight 180 lb (81.647 kg), SpO2 100.00%.  WDWN black male in no acute distress. HEENT:no sinus tenderness. Mild scleral icteurs. NECK:no posterior cervical nodes. LUNGS:right basilar crackles. No vocal fremitus. ZO:XWRUEA S1, S2 without S3. ABD:no tenderness. VWU:JWJXBJYN Homans no edema. NEURO:intact.  Lab results: Results for orders placed during the hospital encounter of 08/20/11 (from the past 48 hour(s))  PHOSPHORUS     Status: Abnormal   Collection Time   08/22/11  7:05 AM      Component Value Range Comment   Phosphorus 4.7 (*) 2.3 - 4.6 (mg/dL)   MAGNESIUM  Status: Normal   Collection Time   08/22/11  7:05 AM      Component Value Range Comment   Magnesium 2.2  1.5 - 2.5 (mg/dL)   COMPREHENSIVE METABOLIC PANEL     Status: Abnormal   Collection Time   08/22/11  7:05 AM      Component Value Range Comment   Sodium 139  135 - 145 (mEq/L)    Potassium 4.1  3.5 - 5.1 (mEq/L)    Chloride 104  96 - 112 (mEq/L)    CO2 27  19 - 32 (mEq/L)    Glucose, Bld 81  70 - 99 (mg/dL)    BUN 8  6 - 23 (mg/dL)    Creatinine, Ser 1.61  0.50 - 1.35 (mg/dL)    Calcium 9.1  8.4 - 10.5 (mg/dL)    Total Protein 7.4  6.0 - 8.3 (g/dL)    Albumin 4.1  3.5 - 5.2 (g/dL)    AST 19  0 - 37 (U/L)    ALT 10  0 - 53 (U/L)    Alkaline Phosphatase 77  39 - 117 (U/L)    Total Bilirubin 2.1 (*) 0.3 - 1.2 (mg/dL)    GFR calc non Af Amer >90  >90 (mL/min)    GFR calc Af Amer >90  >90 (mL/min)   CBC     Status:  Abnormal   Collection Time   08/22/11  7:05 AM      Component Value Range Comment   WBC 11.1 (*) 4.0 - 10.5 (K/uL)    RBC 3.08 (*) 4.22 - 5.81 (MIL/uL)    Hemoglobin 10.3 (*) 13.0 - 17.0 (g/dL)    HCT 09.6 (*) 04.5 - 52.0 (%)    MCV 91.9  78.0 - 100.0 (fL)    MCH 33.4  26.0 - 34.0 (pg)    MCHC 36.4 (*) 30.0 - 36.0 (g/dL)    RDW 40.9  81.1 - 91.4 (%)    Platelets 252  150 - 400 (K/uL)   PRO B NATRIURETIC PEPTIDE     Status: Abnormal   Collection Time   08/22/11  7:05 AM      Component Value Range Comment   Pro B Natriuretic peptide (BNP) 236.0 (*) 0 - 125 (pg/mL)   COMPREHENSIVE METABOLIC PANEL     Status: Abnormal   Collection Time   08/23/11  6:50 AM      Component Value Range Comment   Sodium 138  135 - 145 (mEq/L)    Potassium 4.3  3.5 - 5.1 (mEq/L)    Chloride 103  96 - 112 (mEq/L)    CO2 28  19 - 32 (mEq/L)    Glucose, Bld 86  70 - 99 (mg/dL)    BUN 8  6 - 23 (mg/dL)    Creatinine, Ser 7.82  0.50 - 1.35 (mg/dL)    Calcium 9.2  8.4 - 10.5 (mg/dL)    Total Protein 7.7  6.0 - 8.3 (g/dL)    Albumin 4.1  3.5 - 5.2 (g/dL)    AST 22  0 - 37 (U/L)    ALT 11  0 - 53 (U/L)    Alkaline Phosphatase 80  39 - 117 (U/L)    Total Bilirubin 2.1 (*) 0.3 - 1.2 (mg/dL)    GFR calc non Af Amer >90  >90 (mL/min)    GFR calc Af Amer >90  >90 (mL/min)   CBC     Status: Abnormal   Collection Time  08/23/11  6:50 AM      Component Value Range Comment   WBC 12.3 (*) 4.0 - 10.5 (K/uL)    RBC 3.21 (*) 4.22 - 5.81 (MIL/uL)    Hemoglobin 10.8 (*) 13.0 - 17.0 (g/dL)    HCT 21.3 (*) 08.6 - 52.0 (%)    MCV 91.3  78.0 - 100.0 (fL)    MCH 33.6  26.0 - 34.0 (pg)    MCHC 36.9 (*) 30.0 - 36.0 (g/dL)    RDW 57.8  46.9 - 62.9 (%)    Platelets 249  150 - 400 (K/uL)     Studies/Results: No results found.  Patient Active Problem List  Diagnoses  . Sickle cell anemia with crisis  . Constipation  . Bronchitis  . Acute chest syndrome  . Tobacco abuse    Impression: Sickle cell crisis. Active  hemolysis continues. Colonic dysfuction. Sickle cell lung disease. Tobacco abuse.   Plan: Continue present therapy. Re-evaluate in am. Exchange transfusion if no improvement. PICC line. Lactulose 30 cc p.o. B.i.d. P.r.n.   August Saucer, Ronetta Molla 08/23/2011 11:01 AM

## 2011-08-24 LAB — COMPREHENSIVE METABOLIC PANEL
ALT: 12 U/L (ref 0–53)
Albumin: 3.9 g/dL (ref 3.5–5.2)
Alkaline Phosphatase: 77 U/L (ref 39–117)
Calcium: 8.8 mg/dL (ref 8.4–10.5)
Potassium: 3.6 mEq/L (ref 3.5–5.1)
Sodium: 140 mEq/L (ref 135–145)
Total Protein: 7.1 g/dL (ref 6.0–8.3)

## 2011-08-24 LAB — CBC
HCT: 27.1 % — ABNORMAL LOW (ref 39.0–52.0)
Hemoglobin: 9.8 g/dL — ABNORMAL LOW (ref 13.0–17.0)
MCV: 90.3 fL (ref 78.0–100.0)
RDW: 15.3 % (ref 11.5–15.5)
WBC: 12.6 10*3/uL — ABNORMAL HIGH (ref 4.0–10.5)

## 2011-08-24 LAB — FERRITIN: Ferritin: 749 ng/mL — ABNORMAL HIGH (ref 22–322)

## 2011-08-24 LAB — PREPARE RBC (CROSSMATCH)

## 2011-08-24 MED ORDER — KETOROLAC TROMETHAMINE 30 MG/ML IJ SOLN
30.0000 mg | Freq: Three times a day (TID) | INTRAMUSCULAR | Status: AC
  Administered 2011-08-24 – 2011-08-27 (×9): 30 mg via INTRAVENOUS
  Filled 2011-08-24 (×7): qty 1

## 2011-08-24 MED ORDER — ACETAMINOPHEN 325 MG PO TABS
650.0000 mg | ORAL_TABLET | Freq: Once | ORAL | Status: AC
  Administered 2011-08-25: 650 mg via ORAL
  Filled 2011-08-24 (×2): qty 2

## 2011-08-24 MED ORDER — PANTOPRAZOLE SODIUM 40 MG PO TBEC
40.0000 mg | DELAYED_RELEASE_TABLET | Freq: Every day | ORAL | Status: DC
  Administered 2011-08-25 – 2011-08-27 (×3): 40 mg via ORAL
  Filled 2011-08-24 (×4): qty 1

## 2011-08-24 MED ORDER — DIPHENHYDRAMINE HCL 50 MG/ML IJ SOLN
25.0000 mg | Freq: Once | INTRAMUSCULAR | Status: AC
  Administered 2011-08-25: 25 mg via INTRAVENOUS
  Filled 2011-08-24 (×2): qty 1

## 2011-08-24 MED ORDER — LACTULOSE 10 GM/15ML PO SOLN
10.0000 g | Freq: Every day | ORAL | Status: DC
  Administered 2011-08-25 – 2011-08-27 (×3): 10 g via ORAL
  Filled 2011-08-24 (×3): qty 15

## 2011-08-24 MED ORDER — MUSCLE RUB 10-15 % EX CREA
TOPICAL_CREAM | CUTANEOUS | Status: DC | PRN
  Administered 2011-08-24: 17:00:00 via TOPICAL
  Filled 2011-08-24: qty 85

## 2011-08-24 NOTE — Progress Notes (Signed)
CARE MANAGE MENT UTILIZATION REVIEW NOTE 08/24/2011     Patient:  [name], [name]   Account Number:  [id number]  Documented by:  Bjorn Loser Braeden Kennan   Per Ur Regulation Reviewed for med. necessity/level of care/duration of stay

## 2011-08-24 NOTE — Progress Notes (Signed)
Patient informed that his central line placement is scheduled for tomorrow 08/25/11 and blood exchange will take place after line placed.  He verbalized understanding

## 2011-08-24 NOTE — Progress Notes (Signed)
Patient given the muscle rub to keep at his bedside for use

## 2011-08-24 NOTE — Progress Notes (Signed)
Subjective: The patient was seen on rounds today.  The patient was standing up at the sink and then sat in the reclining chair. The patient states his pain is a 8/10 in his back and bilateral hip areas. The patient stated the recent fluctuations in the weather is what he believes precipitated his current crisis.  The patient continues to have intermittent RUE numbness since receiving a PICC line during his last hospitalization.  The patient would like to receive PT for the numbness.  The patient is agreeable to receiving a PICC line to receive blood exchanges at this time as he states that his pain is not getting any better.  The patient denies any other symptomatology.  No other patient or nursing concerns.   Objective: Vital signs in last 24 hours: Blood pressure 114/61, pulse 72, temperature 97.8 F (36.6 C), temperature source Oral, resp. rate 18, height 6\' 2"  (1.88 m), weight 180 lb (81.647 kg), SpO2 96.00%.  General Appearance: Alert, cooperative, well developed, mild distress Head: Normocephalic, without obvious abnormality, atraumatic Eyes: PERRLA, EOMI, scleral icterus Nose: Nares, septum and mucosa are normal, no drainage or sinus tenderness Throat: Lips, mucosa, and tongue normal; teeth and gums normal Neck: No adenopathy, supple, symmetrical, trachea midline,thyroid not enlarged, symmetric, no tenderness/mass/nodules Back: Symmetric, impaired ROM, bilateral CVA tenderness Resp: Diminished breath sounds bibasilar, CTA, no wheezes/rales/rhonchi Cardio: Regular rate and rhythm, S1, S2 normal, distant heart sounds,  no murmur, click, rub or gallop GI: Soft, non-tender, non-distended, hypoactive bowel sounds, no masses, no organomegaly Male Genitalia: Deferred, the patient denies priapism Extremities: Normal, atraumatic, no cyanosis, no edema, Homans sign is negative, no sign of DVT, limited ROM bilateral LEs Pulses: 2+ and symmetric Skin: Skin color, texture, turgor normal, No rashes or  lesions, LLE hyperpigmentation, well healed scars on trunk and extremities  Neurologic: Grossly normal, AO x 3, CN II - XII grossly intact, no focal deficits Psych:  Appropriate affect  Lab Results: Results for orders placed during the hospital encounter of 08/20/11 (from the past 48 hour(s))  COMPREHENSIVE METABOLIC PANEL     Status: Abnormal   Collection Time   08/23/11  6:50 AM      Component Value Range Comment   Sodium 138  135 - 145 (mEq/L)    Potassium 4.3  3.5 - 5.1 (mEq/L)    Chloride 103  96 - 112 (mEq/L)    CO2 28  19 - 32 (mEq/L)    Glucose, Bld 86  70 - 99 (mg/dL)    BUN 8  6 - 23 (mg/dL)    Creatinine, Ser 4.09  0.50 - 1.35 (mg/dL)    Calcium 9.2  8.4 - 10.5 (mg/dL)    Total Protein 7.7  6.0 - 8.3 (g/dL)    Albumin 4.1  3.5 - 5.2 (g/dL)    AST 22  0 - 37 (U/L)    ALT 11  0 - 53 (U/L)    Alkaline Phosphatase 80  39 - 117 (U/L)    Total Bilirubin 2.1 (*) 0.3 - 1.2 (mg/dL)    GFR calc non Af Amer >90  >90 (mL/min)    GFR calc Af Amer >90  >90 (mL/min)   CBC     Status: Abnormal   Collection Time   08/23/11  6:50 AM      Component Value Range Comment   WBC 12.3 (*) 4.0 - 10.5 (K/uL)    RBC 3.21 (*) 4.22 - 5.81 (MIL/uL)    Hemoglobin 10.8 (*) 13.0 -  17.0 (g/dL)    HCT 45.4 (*) 09.8 - 52.0 (%)    MCV 91.3  78.0 - 100.0 (fL)    MCH 33.6  26.0 - 34.0 (pg)    MCHC 36.9 (*) 30.0 - 36.0 (g/dL)    RDW 11.9  14.7 - 82.9 (%)    Platelets 249  150 - 400 (K/uL)   COMPREHENSIVE METABOLIC PANEL     Status: Abnormal   Collection Time   08/24/11  6:45 AM      Component Value Range Comment   Sodium 140  135 - 145 (mEq/L)    Potassium 3.6  3.5 - 5.1 (mEq/L)    Chloride 103  96 - 112 (mEq/L)    CO2 29  19 - 32 (mEq/L)    Glucose, Bld 100 (*) 70 - 99 (mg/dL)    BUN 8  6 - 23 (mg/dL)    Creatinine, Ser 5.62  0.50 - 1.35 (mg/dL)    Calcium 8.8  8.4 - 10.5 (mg/dL)    Total Protein 7.1  6.0 - 8.3 (g/dL)    Albumin 3.9  3.5 - 5.2 (g/dL)    AST 22  0 - 37 (U/L)    ALT 12  0 - 53  (U/L)    Alkaline Phosphatase 77  39 - 117 (U/L)    Total Bilirubin 2.5 (*) 0.3 - 1.2 (mg/dL)    GFR calc non Af Amer >90  >90 (mL/min)    GFR calc Af Amer >90  >90 (mL/min)   CBC     Status: Abnormal   Collection Time   08/24/11  6:45 AM      Component Value Range Comment   WBC 12.6 (*) 4.0 - 10.5 (K/uL)    RBC 3.00 (*) 4.22 - 5.81 (MIL/uL)    Hemoglobin 9.8 (*) 13.0 - 17.0 (g/dL)    HCT 13.0 (*) 86.5 - 52.0 (%)    MCV 90.3  78.0 - 100.0 (fL)    MCH 32.7  26.0 - 34.0 (pg)    MCHC 36.2 (*) 30.0 - 36.0 (g/dL)    RDW 78.4  69.6 - 29.5 (%)    Platelets 266  150 - 400 (K/uL)     Studies/Results: No results found.  Medications: No Known Allergies  Current Facility-Administered Medications  Medication Dose Route Frequency Provider Last Rate Last Dose  . acetaminophen (TYLENOL) tablet 650 mg  650 mg Oral Q6H PRN Eduard Clos, MD       Or  . acetaminophen (TYLENOL) suppository 650 mg  650 mg Rectal Q6H PRN Eduard Clos, MD      . acetaminophen (TYLENOL) tablet 650 mg  650 mg Oral Once Keitha Butte, NP      . dextrose 5 %-0.45 % sodium chloride infusion   Intravenous Continuous Keitha Butte, NP 75 mL/hr at 08/24/11 0936 75 mL/hr at 08/24/11 0936  . diphenhydrAMINE (BENADRYL) injection 12.5-25 mg  12.5-25 mg Intravenous Q4H PRN Keitha Butte, NP   12.5 mg at 08/21/11 1242  . diphenhydrAMINE (BENADRYL) injection 25 mg  25 mg Intravenous Once Keitha Butte, NP      . enoxaparin (LOVENOX) injection 40 mg  40 mg Subcutaneous Q24H Keitha Butte, NP   40 mg at 08/24/11 0937  . folic acid (FOLVITE) tablet 1 mg  1 mg Oral Daily Eduard Clos, MD   1 mg at 08/24/11 0930  . HYDROmorphone (DILAUDID) injection 2-4 mg  2-4 mg Intravenous Q2H PRN  Keitha Butte, NP   4 mg at 08/24/11 0929  . hydroxyurea (HYDREA) capsule 500 mg  500 mg Oral BID Keitha Butte, NP   500 mg at 08/24/11 0807  . ketorolac (TORADOL) 30 MG/ML injection 30 mg  30 mg  Intravenous Q8H Keitha Butte, NP      . lactulose (CHRONULAC) 10 GM/15ML solution 10 g  10 g Oral Daily Keitha Butte, NP      . lidocaine (LIDODERM) 5 % 2 patch  2 patch Transdermal Q24H Keitha Butte, NP   2 patch at 08/23/11 1221  . morphine (MS CONTIN) 12 hr tablet 60 mg  60 mg Oral BID Eduard Clos, MD   60 mg at 08/24/11 0945  . morphine (MSIR) tablet 30 mg  30 mg Oral Q4H PRN Eduard Clos, MD      . Muscle Rub CREA   Topical PRN Keitha Butte, NP      . nicotine (NICODERM CQ - dosed in mg/24 hours) patch 14 mg  14 mg Transdermal Daily Keitha Butte, NP   14 mg at 08/24/11 0934  . pantoprazole (PROTONIX) EC tablet 40 mg  40 mg Oral Q1200 Keitha Butte, NP      . promethazine (PHENERGAN) injection 12.5-25 mg  12.5-25 mg Intravenous Q4H PRN Keitha Butte, NP   25 mg at 08/24/11 0659  . promethazine (PHENERGAN) tablet 25 mg  25 mg Oral Q6H PRN Eduard Clos, MD      . DISCONTD: ketorolac (TORADOL) 15 MG/ML injection 15 mg  15 mg Intravenous Q8H Keitha Butte, NP   15 mg at 08/24/11 7829  . DISCONTD: lactulose (CHRONULAC) 10 GM/15ML solution 30 g  30 g Oral BID PRN Gwenyth Bender, MD   30 g at 08/23/11 1431  . DISCONTD: polyethylene glycol (MIRALAX / GLYCOLAX) packet 17 g  17 g Oral Daily Keitha Butte, NP   17 g at 08/24/11 5621    Assessment/Plan: Patient Active Problem List  Diagnoses  . Sickle cell anemia with crisis  . Constipation  . Bronchitis  . Acute chest syndrome  . Tobacco abuse   Sickle Cell Crisis:  The patient will receive a PICC line and then is scheduled to receive blood exchange x 2. The patient will continue receiving pain/nausea/pruritis management, IV hydration, anti-inflammatory therapy, Hydroxyurea, Folic Acid and GI/DVT prophylaxis.  CMP, ProBNP and CBC in the am.  Hemoglobinopathy is pending.   Tobacco Abuse:   The patient is receiving  a nicotine patch daily.    Discussed and agreed upon  plan of care with the patient.   The plan of care will be adjusted based on the patient's clinical progress.   Larina Bras, NP-C 08/24/2011, 11:37 AM

## 2011-08-24 NOTE — Progress Notes (Signed)
IR reports that they cannot do the central line until tomorrow.  Malik Chapel, NP notified and gave the OK to wait until tomorrow, but the sooner the better because the patient will need an exchange after the central line is placed.  IR called and notified of the situation.  If they have any openings, they will let us know.

## 2011-08-25 ENCOUNTER — Inpatient Hospital Stay (HOSPITAL_COMMUNITY): Payer: Medicare Other

## 2011-08-25 DIAGNOSIS — R7989 Other specified abnormal findings of blood chemistry: Secondary | ICD-10-CM

## 2011-08-25 LAB — COMPREHENSIVE METABOLIC PANEL
ALT: 11 U/L (ref 0–53)
AST: 21 U/L (ref 0–37)
Albumin: 3.8 g/dL (ref 3.5–5.2)
Alkaline Phosphatase: 77 U/L (ref 39–117)
Calcium: 8.7 mg/dL (ref 8.4–10.5)
Glucose, Bld: 97 mg/dL (ref 70–99)
Potassium: 3.8 mEq/L (ref 3.5–5.1)
Sodium: 138 mEq/L (ref 135–145)
Total Protein: 7 g/dL (ref 6.0–8.3)

## 2011-08-25 LAB — CBC
HCT: 25.8 % — ABNORMAL LOW (ref 39.0–52.0)
Hemoglobin: 9.4 g/dL — ABNORMAL LOW (ref 13.0–17.0)
MCH: 33.1 pg (ref 26.0–34.0)
MCHC: 36.4 g/dL — ABNORMAL HIGH (ref 30.0–36.0)
MCV: 90.8 fL (ref 78.0–100.0)
RDW: 15.3 % (ref 11.5–15.5)

## 2011-08-25 LAB — HEMOGLOBINOPATHY EVALUATION
Hgb A2 Quant: 2.9 % (ref 2.2–3.2)
Hgb S Quant: 44.5 % — ABNORMAL HIGH

## 2011-08-25 MED ORDER — DICLOFENAC SODIUM 1 % TD GEL
Freq: Four times a day (QID) | TRANSDERMAL | Status: DC
  Administered 2011-08-25 – 2011-08-27 (×8): via TOPICAL
  Filled 2011-08-25: qty 100

## 2011-08-25 MED ORDER — ALTEPLASE 100 MG IV SOLR
2.0000 mg | Freq: Once | INTRAVENOUS | Status: AC | PRN
  Filled 2011-08-25: qty 2

## 2011-08-25 MED ORDER — LIDOCAINE 5 % EX PTCH
2.0000 | MEDICATED_PATCH | Freq: Every day | CUTANEOUS | Status: DC | PRN
  Filled 2011-08-25: qty 2

## 2011-08-25 NOTE — Progress Notes (Signed)
Subjective: The patient was seen on rounds today.  The patient was visiting with his nurses and his father.  The patient has received his PICC line and is starting the first of two blood exchanges. The patient states his pain is a 8/10 in his back and bilateral hip areas.  The patient has been assessed by PT for intermittent RUE numbness since receiving a PICC line during his last hospitalization.  The patient denies any other symptomatology.  No other patient or nursing concerns.  Objective: Vital signs in last 24 hours: Blood pressure 130/77, pulse 88, temperature 97.9 F (36.6 C), temperature source Oral, resp. rate 18, height 6\' 2"  (1.88 m), weight 180 lb (81.647 kg), SpO2 97.00%.  General Appearance: Alert, cooperative, well developed, mild distress Head: Normocephalic, without obvious abnormality, atraumatic Eyes: PERRLA, EOMI, scleral icterus Nose: Nares, septum and mucosa are normal, no drainage or sinus tenderness Throat: Lips, mucosa, and tongue normal; teeth and gums normal Neck: No adenopathy, supple, symmetrical, trachea midline,thyroid not enlarged, symmetric, no tenderness/mass/nodules Back: Symmetric, impaired ROM, bilateral CVA tenderness Resp: Diminished breath sounds bibasilar, CTA, no wheezes/rales/rhonchi Cardio: Regular rate and rhythm, S1, S2 normal, no murmur, click, rub or gallop GI: Soft, non-tender, non-distended, hypoactive bowel sounds, no masses, no organomegaly Male Genitalia: Deferred, the patient denies priapism Extremities: Normal, atraumatic, no cyanosis, no edema, Homans sign is negative, no sign of DVT, limited ROM bilateral LEs Pulses: 2+ and symmetric Skin: Skin color, texture, turgor normal, No rashes or lesions, LLE hyperpigmentation, well healed scars on trunk and extremities  Neurologic: Grossly normal, AO x 3, CN II - XII grossly intact, no focal deficits Psych:  Appropriate affect  Lab Results: Results for orders placed during the hospital  encounter of 08/20/11 (from the past 48 hour(s))  COMPREHENSIVE METABOLIC PANEL     Status: Abnormal   Collection Time   08/24/11  6:45 AM      Component Value Range Comment   Sodium 140  135 - 145 (mEq/L)    Potassium 3.6  3.5 - 5.1 (mEq/L)    Chloride 103  96 - 112 (mEq/L)    CO2 29  19 - 32 (mEq/L)    Glucose, Bld 100 (*) 70 - 99 (mg/dL)    BUN 8  6 - 23 (mg/dL)    Creatinine, Ser 1.09  0.50 - 1.35 (mg/dL)    Calcium 8.8  8.4 - 10.5 (mg/dL)    Total Protein 7.1  6.0 - 8.3 (g/dL)    Albumin 3.9  3.5 - 5.2 (g/dL)    AST 22  0 - 37 (U/L)    ALT 12  0 - 53 (U/L)    Alkaline Phosphatase 77  39 - 117 (U/L)    Total Bilirubin 2.5 (*) 0.3 - 1.2 (mg/dL)    GFR calc non Af Amer >90  >90 (mL/min)    GFR calc Af Amer >90  >90 (mL/min)   CBC     Status: Abnormal   Collection Time   08/24/11  6:45 AM      Component Value Range Comment   WBC 12.6 (*) 4.0 - 10.5 (K/uL)    RBC 3.00 (*) 4.22 - 5.81 (MIL/uL)    Hemoglobin 9.8 (*) 13.0 - 17.0 (g/dL)    HCT 60.4 (*) 54.0 - 52.0 (%)    MCV 90.3  78.0 - 100.0 (fL)    MCH 32.7  26.0 - 34.0 (pg)    MCHC 36.2 (*) 30.0 - 36.0 (g/dL)  RDW 15.3  11.5 - 15.5 (%)    Platelets 266  150 - 400 (K/uL)   PREPARE RBC (CROSSMATCH)     Status: Normal   Collection Time   08/24/11 12:00 PM      Component Value Range Comment   Order Confirmation ORDER PROCESSED BY BLOOD BANK     TYPE AND SCREEN     Status: Normal (Preliminary result)   Collection Time   08/24/11 12:15 PM      Component Value Range Comment   ABO/RH(D) O POS      Antibody Screen NEG      Sample Expiration 08/27/2011      Unit Number [id number]      Blood Component Type RED CELLS,LR      Unit division 00      Status of Unit ISSUED      Transfusion Status OK TO TRANSFUSE      Crossmatch Result Compatible      Unit Number [id number]      Blood Component Type RBC LR PHER1      Unit division 00      Status of Unit ALLOCATED      Transfusion Status OK TO TRANSFUSE      Crossmatch Result  Compatible     COMPREHENSIVE METABOLIC PANEL     Status: Abnormal   Collection Time   08/25/11  6:55 AM      Component Value Range Comment   Sodium 138  135 - 145 (mEq/L)    Potassium 3.8  3.5 - 5.1 (mEq/L)    Chloride 105  96 - 112 (mEq/L)    CO2 27  19 - 32 (mEq/L)    Glucose, Bld 97  70 - 99 (mg/dL)    BUN 9  6 - 23 (mg/dL)    Creatinine, Ser 7.82  0.50 - 1.35 (mg/dL)    Calcium 8.7  8.4 - 10.5 (mg/dL)    Total Protein 7.0  6.0 - 8.3 (g/dL)    Albumin 3.8  3.5 - 5.2 (g/dL)    AST 21  0 - 37 (U/L)    ALT 11  0 - 53 (U/L)    Alkaline Phosphatase 77  39 - 117 (U/L)    Total Bilirubin 2.0 (*) 0.3 - 1.2 (mg/dL)    GFR calc non Af Amer >90  >90 (mL/min)    GFR calc Af Amer >90  >90 (mL/min)   CBC     Status: Abnormal   Collection Time   08/25/11  6:55 AM      Component Value Range Comment   WBC 10.2  4.0 - 10.5 (K/uL)    RBC 2.84 (*) 4.22 - 5.81 (MIL/uL)    Hemoglobin 9.4 (*) 13.0 - 17.0 (g/dL)    HCT 95.6 (*) 21.3 - 52.0 (%)    MCV 90.8  78.0 - 100.0 (fL)    MCH 33.1  26.0 - 34.0 (pg)    MCHC 36.4 (*) 30.0 - 36.0 (g/dL)    RDW 08.6  57.8 - 46.9 (%)    Platelets 251  150 - 400 (K/uL)   PRO B NATRIURETIC PEPTIDE     Status: Abnormal   Collection Time   08/25/11  6:55 AM      Component Value Range Comment   Pro B Natriuretic peptide (BNP) 199.1 (*) 0 - 125 (pg/mL)     Studies/Results: Ir Fluoro Guide Cv Line Left  08/25/2011  *RADIOLOGY REPORT*  Clinical Data: Sickle cell crisis in  need of IV access.  PICC LINE PLACEMENT WITH ULTRASOUND AND FLUOROSCOPIC  GUIDANCE  Fluoroscopy Time: 0.2 minutes.  The left arm was prepped with chlorhexidine, draped in the usual sterile fashion using maximum barrier technique (cap and mask, sterile gown, sterile gloves, large sterile sheet, hand hygiene and cutaneous antisepsis) and infiltrated locally with 1% Lidocaine.  Ultrasound demonstrated patency of the left basilic vein, and this was documented with an image.  Under real-time ultrasound  guidance, this vein was accessed with a 21 gauge micropuncture needle and image documentation was performed.  The needle was exchanged over a guidewire for a peel-away sheath through which a five [place] dual lumen PICC trimmed to 47 cm was advanced, positioned with its tip at the lower SVC/right atrial junction.  Fluoroscopy during the procedure and fluoro spot radiograph confirms appropriate catheter position.  The catheter was flushed, secured to the skin with Prolene sutures, and covered with a sterile dressing.  Complications:  None immediate  IMPRESSION: Successful left arm PICC line placement with ultrasound and fluoroscopic guidance.  The catheter is ready for use.  Read by: Anselm Pancoast, P.A.-C  Original Report Authenticated By: Richarda Overlie, M.D.   Ir US Guide Vasc Access Left  08/25/2011  *RADIOLOGY REPORT*  Clinical Data: Sickle cell crisis in need of IV access.  PICC LINE PLACEMENT WITH ULTRASOUND AND FLUOROSCOPIC  GUIDANCE  Fluoroscopy Time: 0.2 minutes.  The left arm was prepped with chlorhexidine, draped in the usual sterile fashion using maximum barrier technique (cap and mask, sterile gown, sterile gloves, large sterile sheet, hand hygiene and cutaneous antisepsis) and infiltrated locally with 1% Lidocaine.  Ultrasound demonstrated patency of the left basilic vein, and this was documented with an image.  Under real-time ultrasound guidance, this vein was accessed with a 21 gauge micropuncture needle and image documentation was performed.  The needle was exchanged over a guidewire for a peel-away sheath through which a five [place] dual lumen PICC trimmed to 47 cm was advanced, positioned with its tip at the lower SVC/right atrial junction.  Fluoroscopy during the procedure and fluoro spot radiograph confirms appropriate catheter position.  The catheter was flushed, secured to the skin with Prolene sutures, and covered with a sterile dressing.  Complications:  None immediate  IMPRESSION:  Successful left arm PICC line placement with ultrasound and fluoroscopic guidance.  The catheter is ready for use.  Read by: Anselm Pancoast, P.A.-C  Original Report Authenticated By: Richarda Overlie, M.D.    Medications: No Known Allergies  Current Facility-Administered Medications  Medication Dose Route Frequency Provider Last Rate Last Dose  . acetaminophen (TYLENOL) tablet 650 mg  650 mg Oral Q6H PRN Eduard Clos, MD       Or  . acetaminophen (TYLENOL) suppository 650 mg  650 mg Rectal Q6H PRN Eduard Clos, MD      . acetaminophen (TYLENOL) tablet 650 mg  650 mg Oral Once Keitha Butte, NP   650 mg at 08/25/11 1632  . alteplase (ACTIVASE) injection 2 mg  2 mg Intracatheter Once PRN Keitha Butte, NP      . dextrose 5 %-0.45 % sodium chloride infusion   Intravenous Continuous Keitha Butte, NP 50 mL/hr at 08/25/11 1700    . diclofenac sodium (VOLTAREN) 1 % transdermal gel   Topical QID Keitha Butte, NP      . diphenhydrAMINE (BENADRYL) injection 12.5-25 mg  12.5-25 mg Intravenous Q4H PRN Keitha Butte, NP   12.5 mg  at 08/21/11 1242  . diphenhydrAMINE (BENADRYL) injection 25 mg  25 mg Intravenous Once Keitha Butte, NP   25 mg at 08/25/11 1633  . enoxaparin (LOVENOX) injection 40 mg  40 mg Subcutaneous Q24H Keitha Butte, NP   40 mg at 08/25/11 1113  . folic acid (FOLVITE) tablet 1 mg  1 mg Oral Daily Eduard Clos, MD   1 mg at 08/25/11 1113  . HYDROmorphone (DILAUDID) injection 2-4 mg  2-4 mg Intravenous Q2H PRN Keitha Butte, NP   4 mg at 08/25/11 1658  . hydroxyurea (HYDREA) capsule 500 mg  500 mg Oral BID Keitha Butte, NP   500 mg at 08/25/11 0826  . ketorolac (TORADOL) 30 MG/ML injection 30 mg  30 mg Intravenous Q8H Keitha Butte, NP   30 mg at 08/25/11 1414  . lactulose (CHRONULAC) 10 GM/15ML solution 10 g  10 g Oral Daily Keitha Butte, NP   10 g at 08/25/11 1112  . lidocaine (LIDODERM) 5 % 2 patch  2  patch Transdermal Daily PRN Keitha Butte, NP      . morphine (MS CONTIN) 12 hr tablet 60 mg  60 mg Oral BID Eduard Clos, MD   60 mg at 08/25/11 1113  . morphine (MSIR) tablet 30 mg  30 mg Oral Q4H PRN Eduard Clos, MD      . Muscle Rub CREA   Topical PRN Keitha Butte, NP      . nicotine (NICODERM CQ - dosed in mg/24 hours) patch 14 mg  14 mg Transdermal Daily Keitha Butte, NP   14 mg at 08/25/11 1112  . pantoprazole (PROTONIX) EC tablet 40 mg  40 mg Oral Q1200 Keitha Butte, NP   40 mg at 08/25/11 1113  . promethazine (PHENERGAN) injection 12.5-25 mg  12.5-25 mg Intravenous Q4H PRN Keitha Butte, NP   25 mg at 08/25/11 1414  . promethazine (PHENERGAN) tablet 25 mg  25 mg Oral Q6H PRN Eduard Clos, MD      . DISCONTD: lidocaine (LIDODERM) 5 % 2 patch  2 patch Transdermal Q24H Keitha Butte, NP   2 patch at 08/24/11 1217    Assessment/Plan: Patient Active Problem List  Diagnoses  . Sickle cell anemia with crisis  . Constipation  . Bronchitis  . Acute chest syndrome  . Tobacco abuse  . Elevated brain natriuretic peptide (BNP) level   Sickle Cell Crisis:  The patient received a PICC line today and is scheduled to receive blood exchange x 2. The patient will continue receiving pain/nausea/pruritis management, IV hydration, anti-inflammatory therapy, Hydroxyurea, Folic Acid and GI/DVT prophylaxis.  CMP, ProBNP and CBC in the am.  Hemoglobinopathy has been received. Tobacco Abuse:   The patient is receiving  a nicotine patch daily.   Elevated BNP:  IVF's have been decreased to 50 ml/hr - recheck ProBNP in the am  Discussed and agreed upon plan of care with the patient.   The plan of care will be adjusted based on the patient's clinical progress.   Larina Bras, NP-C 08/25/2011, 5:14 PM

## 2011-08-25 NOTE — Progress Notes (Signed)
08/25/2011 PT screen performed.  Noted orders to assess patient's right arm due to numbness after recent PICC line placement.  The patient seems to be having some cutaneous numbness in his T1 dermatome that doesn't go into his fingers or hand.  It is localized to ~ 4" ovular area just distal to his elbow.  Elbow, wrist, shoulder and finger strength WNL, so numbness has only affected the dermatome, not the muscular innervation.  The PICC line may have traumatized a nerve and the nerve is slow to heal.  I educated the patient that it may take months for an injured nerve to heal and advised him on warning signs that it was getting worse (spreading of the numbness into the hand, decreased hand wrist or elbow strength) and when he would need to speak to the doctor again.  The patient has no current PT needs and no f/u PT needs related to his arm.  If PT is needed (patient did not think so at this time) for mobility assessment please re-order.  PT to sign off. Thanks, [name]. [name], PT, DPT [phone]

## 2011-08-26 ENCOUNTER — Inpatient Hospital Stay (HOSPITAL_COMMUNITY): Payer: Medicare Other

## 2011-08-26 LAB — TYPE AND SCREEN: Unit division: 0

## 2011-08-26 LAB — COMPREHENSIVE METABOLIC PANEL
ALT: 12 U/L (ref 0–53)
BUN: 10 mg/dL (ref 6–23)
CO2: 27 mEq/L (ref 19–32)
Calcium: 8.9 mg/dL (ref 8.4–10.5)
GFR calc Af Amer: >90 mL/min (ref >90)
GFR calc non Af Amer: >90 mL/min (ref >90)
Glucose, Bld: 83 mg/dL (ref 70–99)
Sodium: 140 mEq/L (ref 135–145)
Total Protein: 6.9 g/dL (ref 6.0–8.3)

## 2011-08-26 LAB — PRO B NATRIURETIC PEPTIDE: Pro B Natriuretic peptide (BNP): 144.3 pg/mL — ABNORMAL HIGH (ref 0–125)

## 2011-08-26 LAB — CBC
HCT: 28.7 % — ABNORMAL LOW (ref 39.0–52.0)
Hemoglobin: 10.3 g/dL — ABNORMAL LOW (ref 13.0–17.0)
MCHC: 35.9 g/dL (ref 30.0–36.0)
RDW: 15.8 % — ABNORMAL HIGH (ref 11.5–15.5)
WBC: 9.6 10*3/uL (ref 4.0–10.5)

## 2011-08-26 MED ORDER — SODIUM CHLORIDE 0.9 % IJ SOLN
10.0000 mL | Freq: Two times a day (BID) | INTRAMUSCULAR | Status: DC
  Administered 2011-08-26 – 2011-08-27 (×2): 10 mL

## 2011-08-26 MED ORDER — SODIUM CHLORIDE 0.9 % IJ SOLN
10.0000 mL | INTRAMUSCULAR | Status: DC | PRN
  Administered 2011-08-26 – 2011-08-27 (×2): 10 mL

## 2011-08-26 NOTE — Progress Notes (Signed)
Subjective:  The patient reports he's feeling much better today. He notes that his overall pain has dropped to a 4/10. He however has significant pain up to 7/10 in his left hip with ambulation. He denies increased chest pains or shortness of breath. Appetite otherwise good. No other new complaints.   No Known Allergies Current Facility-Administered Medications  Medication Dose Route Frequency Provider Last Rate Last Dose  . acetaminophen (TYLENOL) tablet 650 mg  650 mg Oral Q6H PRN Eduard Clos, MD       Or  . acetaminophen (TYLENOL) suppository 650 mg  650 mg Rectal Q6H PRN Eduard Clos, MD      . acetaminophen (TYLENOL) tablet 650 mg  650 mg Oral Once Keitha Butte, NP   650 mg at 08/25/11 1632  . alteplase (ACTIVASE) injection 2 mg  2 mg Intracatheter Once PRN Keitha Butte, NP      . dextrose 5 %-0.45 % sodium chloride infusion   Intravenous Continuous Keitha Butte, NP 50 mL/hr at 08/25/11 1842    . diclofenac sodium (VOLTAREN) 1 % transdermal gel   Topical QID Keitha Butte, NP      . diphenhydrAMINE (BENADRYL) injection 12.5-25 mg  12.5-25 mg Intravenous Q4H PRN Keitha Butte, NP   25 mg at 08/25/11 2132  . diphenhydrAMINE (BENADRYL) injection 25 mg  25 mg Intravenous Once Keitha Butte, NP   25 mg at 08/25/11 1633  . enoxaparin (LOVENOX) injection 40 mg  40 mg Subcutaneous Q24H Keitha Butte, NP   40 mg at 08/26/11 1107  . folic acid (FOLVITE) tablet 1 mg  1 mg Oral Daily Eduard Clos, MD   1 mg at 08/26/11 1108  . HYDROmorphone (DILAUDID) injection 2-4 mg  2-4 mg Intravenous Q2H PRN Keitha Butte, NP   4 mg at 08/26/11 0807  . hydroxyurea (HYDREA) capsule 500 mg  500 mg Oral BID Keitha Butte, NP   500 mg at 08/26/11 0807  . ketorolac (TORADOL) 30 MG/ML injection 30 mg  30 mg Intravenous Q8H Keitha Butte, NP   30 mg at 08/26/11 0609  . lactulose (CHRONULAC) 10 GM/15ML solution 10 g  10 g Oral Daily  Keitha Butte, NP   10 g at 08/26/11 1108  . lidocaine (LIDODERM) 5 % 2 patch  2 patch Transdermal Daily PRN Keitha Butte, NP      . morphine (MS CONTIN) 12 hr tablet 60 mg  60 mg Oral BID Eduard Clos, MD   60 mg at 08/26/11 1108  . morphine (MSIR) tablet 30 mg  30 mg Oral Q4H PRN Eduard Clos, MD      . Muscle Rub CREA   Topical PRN Keitha Butte, NP      . nicotine (NICODERM CQ - dosed in mg/24 hours) patch 14 mg  14 mg Transdermal Daily Keitha Butte, NP   14 mg at 08/26/11 1107  . pantoprazole (PROTONIX) EC tablet 40 mg  40 mg Oral Q1200 Keitha Butte, NP   40 mg at 08/26/11 1108  . promethazine (PHENERGAN) injection 12.5-25 mg  12.5-25 mg Intravenous Q4H PRN Keitha Butte, NP   25 mg at 08/26/11 0814  . promethazine (PHENERGAN) tablet 25 mg  25 mg Oral Q6H PRN Eduard Clos, MD      . DISCONTD: lidocaine (LIDODERM) 5 % 2 patch  2 patch Transdermal Q24H Keitha Butte, NP   2  patch at 08/24/11 1217    Objective: Blood pressure 123/83, pulse 71, temperature 97.9 F (36.6 C), temperature source Oral, resp. rate 20, height 6\' 2"  (1.88 m), weight 180 lb (81.647 kg), SpO2 93.00%.  Well-developed well-nourished black male in no acute distress. Father's present. HEENT: No sinus tenderness. No sclera icterus. NECK: No posterior cervical nodes. LUNGS: Clear to auscultation. No vocal fremitus. CV: Normal S1, S2 without S3. ABD: No epigastric tenderness. MSK: Tenderness in the left hip to palpation. Negative Homans bilaterally. NEURO: Intact.  Lab results: Results for orders placed during the hospital encounter of 08/20/11 (from the past 48 hour(s))  COMPREHENSIVE METABOLIC PANEL     Status: Abnormal   Collection Time   08/25/11  6:55 AM      Component Value Range Comment   Sodium 138  135 - 145 (mEq/L)    Potassium 3.8  3.5 - 5.1 (mEq/L)    Chloride 105  96 - 112 (mEq/L)    CO2 27  19 - 32 (mEq/L)    Glucose, Bld 97  70 - 99  (mg/dL)    BUN 9  6 - 23 (mg/dL)    Creatinine, Ser 4.54  0.50 - 1.35 (mg/dL)    Calcium 8.7  8.4 - 10.5 (mg/dL)    Total Protein 7.0  6.0 - 8.3 (g/dL)    Albumin 3.8  3.5 - 5.2 (g/dL)    AST 21  0 - 37 (U/L)    ALT 11  0 - 53 (U/L)    Alkaline Phosphatase 77  39 - 117 (U/L)    Total Bilirubin 2.0 (*) 0.3 - 1.2 (mg/dL)    GFR calc non Af Amer >90  >90 (mL/min)    GFR calc Af Amer >90  >90 (mL/min)   CBC     Status: Abnormal   Collection Time   08/25/11  6:55 AM      Component Value Range Comment   WBC 10.2  4.0 - 10.5 (K/uL)    RBC 2.84 (*) 4.22 - 5.81 (MIL/uL)    Hemoglobin 9.4 (*) 13.0 - 17.0 (g/dL)    HCT 09.8 (*) 11.9 - 52.0 (%)    MCV 90.8  78.0 - 100.0 (fL)    MCH 33.1  26.0 - 34.0 (pg)    MCHC 36.4 (*) 30.0 - 36.0 (g/dL)    RDW 14.7  82.9 - 56.2 (%)    Platelets 251  150 - 400 (K/uL)   PRO B NATRIURETIC PEPTIDE     Status: Abnormal   Collection Time   08/25/11  6:55 AM      Component Value Range Comment   Pro B Natriuretic peptide (BNP) 199.1 (*) 0 - 125 (pg/mL)   COMPREHENSIVE METABOLIC PANEL     Status: Abnormal   Collection Time   08/26/11  5:42 AM      Component Value Range Comment   Sodium 140  135 - 145 (mEq/L)    Potassium 3.9  3.5 - 5.1 (mEq/L)    Chloride 104  96 - 112 (mEq/L)    CO2 27  19 - 32 (mEq/L)    Glucose, Bld 83  70 - 99 (mg/dL)    BUN 10  6 - 23 (mg/dL)    Creatinine, Ser 1.30  0.50 - 1.35 (mg/dL)    Calcium 8.9  8.4 - 10.5 (mg/dL)    Total Protein 6.9  6.0 - 8.3 (g/dL)    Albumin 3.7  3.5 - 5.2 (g/dL)  AST 23  0 - 37 (U/L)    ALT 12  0 - 53 (U/L)    Alkaline Phosphatase 74  39 - 117 (U/L)    Total Bilirubin 2.1 (*) 0.3 - 1.2 (mg/dL)    GFR calc non Af Amer >90  >90 (mL/min)    GFR calc Af Amer >90  >90 (mL/min)   CBC     Status: Abnormal   Collection Time   08/26/11  5:42 AM      Component Value Range Comment   WBC 9.6  4.0 - 10.5 (K/uL)    RBC 3.25 (*) 4.22 - 5.81 (MIL/uL)    Hemoglobin 10.3 (*) 13.0 - 17.0 (g/dL)    HCT 16.1 (*) 09.6  - 52.0 (%)    MCV 88.3  78.0 - 100.0 (fL)    MCH 31.7  26.0 - 34.0 (pg)    MCHC 35.9  30.0 - 36.0 (g/dL)    RDW 04.5 (*) 40.9 - 15.5 (%)    Platelets 234  150 - 400 (K/uL)   PRO B NATRIURETIC PEPTIDE     Status: Abnormal   Collection Time   08/26/11  5:42 AM      Component Value Range Comment   Pro B Natriuretic peptide (BNP) 144.3 (*) 0 - 125 (pg/mL)     Studies/Results: Dg Hip Bilateral W/pelvis  08/26/2011  *RADIOLOGY REPORT*  Clinical Data: Bilateral hip pain, history of sickle cell disease  BILATERAL HIP WITH PELVIS - 4+ VIEW  Comparison: Abdomen films of 11/02/2006  Findings: The right total hip replacement is unchanged in position. Abnormality of the left femoral head appears relatively stable in this patient with sickle cell disease with changes probably related to bone infarcts and possibly mild avascular necrosis.  Areas of sclerosis in the iliac bones and the SI joints are again noted probably related to bone infarcts as well.  No acute fracture is seen.  IMPRESSION:  1.  Stable right total hip replacement. 2.  No change in deformity of the left femoral head most likely related to bone infarcts in this patient with sickle cell disease.  Original Report Authenticated By: Juline Patch, M.D.   Ir Fluoro Guide Cv Line Left  08/25/2011  *RADIOLOGY REPORT*  Clinical Data: Sickle cell crisis in need of IV access.  PICC LINE PLACEMENT WITH ULTRASOUND AND FLUOROSCOPIC  GUIDANCE  Fluoroscopy Time: 0.2 minutes.  The left arm was prepped with chlorhexidine, draped in the usual sterile fashion using maximum barrier technique (cap and mask, sterile gown, sterile gloves, large sterile sheet, hand hygiene and cutaneous antisepsis) and infiltrated locally with 1% Lidocaine.  Ultrasound demonstrated patency of the left basilic vein, and this was documented with an image.  Under real-time ultrasound guidance, this vein was accessed with a 21 gauge micropuncture needle and image documentation was performed.   The needle was exchanged over a guidewire for a peel-away sheath through which a five [place] dual lumen PICC trimmed to 47 cm was advanced, positioned with its tip at the lower SVC/right atrial junction.  Fluoroscopy during the procedure and fluoro spot radiograph confirms appropriate catheter position.  The catheter was flushed, secured to the skin with Prolene sutures, and covered with a sterile dressing.  Complications:  None immediate  IMPRESSION: Successful left arm PICC line placement with ultrasound and fluoroscopic guidance.  The catheter is ready for use.  Read by: Anselm Pancoast, P.A.-C  Original Report Authenticated By: Richarda Overlie, M.D.   Ir US Guide  Vasc Access Left  08/25/2011  *RADIOLOGY REPORT*  Clinical Data: Sickle cell crisis in need of IV access.  PICC LINE PLACEMENT WITH ULTRASOUND AND FLUOROSCOPIC  GUIDANCE  Fluoroscopy Time: 0.2 minutes.  The left arm was prepped with chlorhexidine, draped in the usual sterile fashion using maximum barrier technique (cap and mask, sterile gown, sterile gloves, large sterile sheet, hand hygiene and cutaneous antisepsis) and infiltrated locally with 1% Lidocaine.  Ultrasound demonstrated patency of the left basilic vein, and this was documented with an image.  Under real-time ultrasound guidance, this vein was accessed with a 21 gauge micropuncture needle and image documentation was performed.  The needle was exchanged over a guidewire for a peel-away sheath through which a five [place] dual lumen PICC trimmed to 47 cm was advanced, positioned with its tip at the lower SVC/right atrial junction.  Fluoroscopy during the procedure and fluoro spot radiograph confirms appropriate catheter position.  The catheter was flushed, secured to the skin with Prolene sutures, and covered with a sterile dressing.  Complications:  None immediate  IMPRESSION: Successful left arm PICC line placement with ultrasound and fluoroscopic guidance.  The catheter is ready for use.   Read by: Anselm Pancoast, P.A.-C  Original Report Authenticated By: Richarda Overlie, M.D.    Patient Active Problem List  Diagnoses  . Sickle cell anemia with crisis  . Constipation  . Bronchitis  . Acute chest syndrome  . Tobacco abuse  . Elevated brain natriuretic peptide (BNP) level    Impression: Resolving sickle cell crisis. 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 Sickle cell lung disease. Tobacco abuse, presently abstaining.    Plan: Continues present therapy. Orthopedic consult. Questionable candidate for intra-articular injection. Progress for discharge. He is able to manage his other pains except his hip.   August Saucer, Cristan Hout 08/26/2011 1:49 PM

## 2011-08-27 ENCOUNTER — Encounter (HOSPITAL_COMMUNITY): Payer: Self-pay | Admitting: Orthopedic Surgery

## 2011-08-27 DIAGNOSIS — M87052 Idiopathic aseptic necrosis of left femur: Secondary | ICD-10-CM

## 2011-08-27 HISTORY — DX: Idiopathic aseptic necrosis of left femur: M87.052

## 2011-08-27 LAB — CBC
HCT: 29.4 % — ABNORMAL LOW (ref 39.0–52.0)
MCHC: 35.4 g/dL (ref 30.0–36.0)
MCV: 88.8 fL (ref 78.0–100.0)
RDW: 15.9 % — ABNORMAL HIGH (ref 11.5–15.5)
WBC: 11 10*3/uL — ABNORMAL HIGH (ref 4.0–10.5)

## 2011-08-27 LAB — COMPREHENSIVE METABOLIC PANEL
ALT: 14 U/L (ref 0–53)
AST: 26 U/L (ref 0–37)
Alkaline Phosphatase: 78 U/L (ref 39–117)
Calcium: 8.8 mg/dL (ref 8.4–10.5)
Potassium: 3.9 mEq/L (ref 3.5–5.1)
Sodium: 139 mEq/L (ref 135–145)
Total Protein: 7 g/dL (ref 6.0–8.3)

## 2011-08-27 MED ORDER — LIDOCAINE 5 % EX PTCH: 2.0000 | MEDICATED_PATCH | Freq: Every day | CUTANEOUS | Status: AC | PRN

## 2011-08-27 MED ORDER — HYDROXYUREA 500 MG PO CAPS: 500.0000 mg | ORAL_CAPSULE | Freq: Two times a day (BID) | ORAL | Status: AC

## 2011-08-27 MED ORDER — PROMETHAZINE HCL 25 MG PO TABS: 25.0000 mg | ORAL_TABLET | Freq: Four times a day (QID) | ORAL | Status: DC | PRN

## 2011-08-27 MED ORDER — DICLOFENAC SODIUM 1 % TD GEL: 1.0000 "application " | Freq: Four times a day (QID) | TRANSDERMAL | Status: DC

## 2011-08-27 MED ORDER — MORPHINE SULFATE 30 MG PO TABS: 30.0000 mg | ORAL_TABLET | ORAL | Status: DC | PRN

## 2011-08-27 NOTE — Discharge Summary (Signed)
Physician Discharge Summary  Patient ID: [name] MRN: [id number] DOB/AGE: 29-Nov-1975 36 y.o.  Admit date: 08/20/2011 Discharge date: 08/27/2011  Admission Diagnoses: Sickle cell anemia with pain crisis   Tobacco abuse   Discharge Diagnoses:  Sickle Cell Crisis Tobacco Abuse Elevated BNP  Discharged Condition: Stable  Hospital Course:   Please refer to the detailed H&P for information surrounding this patient's admission.  In short, [name] is a pleasant 36 year-old, African-American male who presented to the ER complaining of increasing pain in his hip and low back for 3-4 days prior to presentation.  Upon admission, conservative measures of IV hydration and pain management were undertaken to relieve the patient's pain as the patient declined to receive a PICC line.  After a few days of the patient's pain not subsiding, the patient agreed to receive a PICC line and the patient receive blood exchanges.  The patient also receive aggressive pain/nausea/pruritis/bowel management while hospitalized.  The patient was complaining of numbness in his RUE from when he received a PICC line during his prior hospitalization.  The patient was assessed by PT for this RUE numbness.  The patient also underwent bilateral hip x-rays for his continuous hip pain.  The x-rays were essentially negative (see below).  Dr. Dion Saucier, Orthopedic Surgeon was consulted regarding the patient's chronic hip pain.  The patient stated that he was ready to manage his pain at home.  The patient was discharged in stable condition to home.   Consults: Dr. Dion Saucier, Orthopedic Surgeon  Significant Diagnostic Studies:  Dg Chest 2 View 08/20/2011  *RADIOLOGY REPORT*  Clinical Data: Sickle cell crisis.  Short of breath  CHEST - 2 VIEW  Comparison: 05/16/2011  Findings: Heart size is normal and the vascularity is  normal. Prominent lung markings due to scarring.  Negative for pneumonia or effusion.  IMPRESSION: Chronic lung  disease.  No acute abnormality.  Original Report Authenticated By: Camelia Phenes, M.D.   Dg Hip Bilateral Vito Berger 08/26/2011  *RADIOLOGY REPORT*  Clinical Data: Bilateral hip pain, history of sickle cell disease  BILATERAL HIP WITH PELVIS - 4+ VIEW  Comparison: Abdomen films of 11/02/2006  Findings: The right total hip replacement is unchanged in position. Abnormality of the left femoral head appears relatively stable in this patient with sickle cell disease with changes probably related to bone infarcts and possibly mild avascular necrosis.  Areas of sclerosis in the iliac bones and the SI joints are again noted probably related to bone infarcts as well.  No acute fracture is seen.  IMPRESSION:  1.  Stable right total hip replacement. 2.  No change in deformity of the left femoral head most likely related to bone infarcts in this patient with sickle cell disease.  Original Report Authenticated By: Juline Patch, M.D.   Ir Fluoro Guide Cv Line Left 08/25/2011  *RADIOLOGY REPORT*  Clinical Data: Sickle cell crisis in need of IV access.  PICC LINE PLACEMENT WITH ULTRASOUND AND FLUOROSCOPIC  GUIDANCE  Fluoroscopy Time: 0.2 minutes.  The left arm was prepped with chlorhexidine, draped in the usual sterile fashion using maximum barrier technique (cap and mask, sterile gown, sterile gloves, large sterile sheet, hand hygiene and cutaneous antisepsis) and infiltrated locally with 1% Lidocaine.  Ultrasound demonstrated patency of the left basilic vein, and this was documented with an image.  Under real-time ultrasound guidance, this vein was accessed with a 21 gauge micropuncture needle and image documentation was performed.  The needle was exchanged over a  guidewire for a peel-away sheath through which a five [place] dual lumen PICC trimmed to 47 cm was advanced, positioned with its tip at the lower SVC/right atrial junction.  Fluoroscopy during the procedure and fluoro spot radiograph confirms appropriate catheter  position.  The catheter was flushed, secured to the skin with Prolene sutures, and covered with a sterile dressing.  Complications:  None immediate  IMPRESSION: Successful left arm PICC line placement with ultrasound and fluoroscopic guidance.  The catheter is ready for use.  Read by: Anselm Pancoast, P.A.-C  Original Report Authenticated By: Richarda Overlie, M.D.    Treatments: IV hydration,  analgesia: Dilaudid, anticoagulation: Lovenox, respiratory therapy: O2, therapies: PT, procedures: PICC line, Exchange transfusions  Discharge Exam: Blood pressure 133/82, pulse 69, temperature 98.3 F (36.8 C), temperature source Oral, resp. rate 20, height 6\' 2"  (1.88 m), weight 180 lb (81.647 kg), SpO2 96.00%.  General Appearance: Alert, cooperative, well developed, no apparent distress  Head: Normocephalic, without obvious abnormality, atraumatic  Eyes: PERRLA, EOMI, scleral icterus  Nose: Nares, septum and mucosa are normal, no drainage or sinus tenderness  Throat: Lips, mucosa, and tongue normal; teeth and gums normal  Neck: No adenopathy, supple, symmetrical, trachea midline,thyroid not enlarged, symmetric, no tenderness/mass/nodules  Back: Symmetric, improved ROM, slight tenderness  Resp: Diminished breath sounds bibasilar, CTA, no wheezes/rales/rhonchi  Cardio: Regular rate and rhythm, S1, S2 normal, no murmur, click, rub or gallop  GI: Soft, non-tender, non-distended, hypoactive bowel sounds, no masses, no organomegaly  Male Genitalia: Deferred, the patient denies priapism  Extremities: Normal, atraumatic, no cyanosis, no edema, Homans sign is negative, no sign of DVT, improved ROM bilateral LEs  Pulses: 2+ and symmetric  Skin: Skin color, texture, turgor normal, No rashes or lesions, LLE hyperpigmentation, well healed scars on trunk and extremities  Neurologic: Grossly normal, AO x 3, CN II - XII grossly intact, no focal deficits  Psych: Appropriate affect   Disposition: 01-Home or Self  Care Discharge Orders    Future Orders Please Complete By Expires   Increase activity slowly      Discharge instructions      Comments:   Take all medications as prescribed Rest Hydrate Continue to use incentive spirometer daily Keep warm Keep all follow up appointments   Call MD for:  severe uncontrolled pain        Medication List  As of 08/27/2011 12:41 PM   TAKE these medications         diclofenac sodium 1 % Gel   Commonly known as: VOLTAREN   Apply 1 application topically 4 (four) times daily.      folic acid 1 MG tablet   Commonly known as: FOLVITE   Take 1 tablet (1 mg total) by mouth daily.      hydroxyurea 500 MG capsule   Commonly known as: HYDREA   Take 1 capsule (500 mg total) by mouth 2 (two) times daily. May take with food to minimize GI side effects.      lidocaine 5 %   Commonly known as: LIDODERM   Place 2 patches onto the skin daily as needed. Remove & Discard patch within 12 hours or as directed by MD      morphine 60 MG 12 hr tablet   Commonly known as: MS CONTIN   Take 1 tablet (60 mg total) by mouth 2 (two) times daily.      morphine 30 MG tablet   Commonly known as: MSIR   Take 1 tablet (  30 mg total) by mouth every 4 (four) hours as needed for pain. Breakthrough pain      promethazine 25 MG tablet   Commonly known as: PHENERGAN   Take 1 tablet (25 mg total) by mouth every 6 (six) hours as needed.           Follow-up Information    Follow up with LANDAU,JOSHUA P, MD in 2 weeks.   Contact information:   [facility] [address] [place] [address] [phone]       Follow up with Dorrene German, MD. Schedule an appointment as soon as possible for a visit in 1 month.   Contact information:   [address] [place] [address] [phone]       Follow up with August Saucer, ERIC, MD. Schedule an appointment as soon as possible for a visit in 1 week. (Go to the ER or the sickle  cell medical clinic if symptoms worsen)    Contact information:   [address] [facility] [place] [address] [phone]         Time Spent on Discharge:  Greater than 30 minutes  Signed: Larina Bras 08/27/2011, 12:41 PM

## 2011-08-27 NOTE — Consult Note (Signed)
ORTHOPAEDIC CONSULTATION  REQUESTING PHYSICIAN: Gwenyth Bender, MD  Chief Complaint: Left hip pain  HPI: [name] is a 36 y.o. male who complains of  left hip pain. This has been chronic, and going on for years. He has sickle cell disease, and is currently admitted to the hospital with sickle cell crisis. 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 He says the right hip has done fairly well, and is definitely better than it was before his hip replacement, although he still gets pain in his right hip from time to time, but this is tolerable.  His left hip currently has moderate to severe pain, and he says that this comes and goes, depending on his crisis. He has been able to walk, but is requiring a fair amount of narcotics. It's located around the left hip and groin, as well as lateral side. It's better with pain medication and worse with activity.  Past Medical History  Diagnosis Date  . Sickle cell crisis   . Blood transfusion   . Avascular necrosis of hip     bilateral  . Infection of bone, shoulder region     left shoulder  . Pneumonia    Past Surgical History  Procedure Date  . Orif right hip fracture 1995  . Joint replacement 2006    right total hip arthroplasty   History   Social History  . Marital Status: Single    Spouse Name: N/A    Number of Children: N/A  . Years of Education: N/A   Social History Main Topics  . Smoking status: Current Everyday Smoker -- 0.5 packs/day    Types: Cigarettes  . Smokeless tobacco: Never Used  . Alcohol Use: Yes  . Drug Use: No  . Sexually Active:    Other Topics Concern  . None   Social History Narrative  . None   History reviewed. No pertinent family history. his family history is unknown, as she is adopted. No Known Allergies Prior to Admission medications   Medication Sig Start Date End Date  Taking? Authorizing Provider  folic acid (FOLVITE) 1 MG tablet Take 1 tablet (1 mg total) by mouth daily. 05/22/11 05/21/12 Yes Keitha Butte, NP  morphine (MS CONTIN) 60 MG 12 hr tablet Take 1 tablet (60 mg total) by mouth 2 (two) times daily. 05/22/11  Yes Keitha Butte, NP  morphine (MSIR) 30 MG tablet Take 1 tablet (30 mg total) by mouth every 4 (four) hours as needed. Breakthrough pain 05/22/11  Yes Keitha Butte, NP  promethazine (PHENERGAN) 25 MG tablet Take 1 tablet (25 mg total) by mouth every 6 (six) hours as needed. 05/22/11  Yes Keitha Butte, NP   Dg Hip Bilateral W/pelvis  08/26/2011  *RADIOLOGY REPORT*  Clinical Data: Bilateral hip pain, history of sickle cell disease  BILATERAL HIP WITH PELVIS - 4+ VIEW  Comparison: Abdomen films of 11/02/2006  Findings: The right total hip replacement is unchanged in position. Abnormality of the left femoral head appears relatively stable in this patient with sickle cell disease with changes probably related to bone infarcts and possibly mild avascular necrosis.  Areas of sclerosis in the iliac bones and the SI joints are again noted probably related to bone infarcts as well.  No acute fracture is seen.  IMPRESSION:  1.  Stable right total hip replacement.  2.  No change in deformity of the left femoral head most likely related to bone infarcts in this patient with sickle cell disease.  Original Report Authenticated By: Juline Patch, M.D.   Ir Fluoro Guide Cv Line Left  08/25/2011  *RADIOLOGY REPORT*  Clinical Data: Sickle cell crisis in need of IV access.  PICC LINE PLACEMENT WITH ULTRASOUND AND FLUOROSCOPIC  GUIDANCE  Fluoroscopy Time: 0.2 minutes.  The left arm was prepped with chlorhexidine, draped in the usual sterile fashion using maximum barrier technique (cap and mask, sterile gown, sterile gloves, large sterile sheet, hand hygiene and cutaneous antisepsis) and infiltrated locally with 1% Lidocaine.  Ultrasound demonstrated  patency of the left basilic vein, and this was documented with an image.  Under real-time ultrasound guidance, this vein was accessed with a 21 gauge micropuncture needle and image documentation was performed.  The needle was exchanged over a guidewire for a peel-away sheath through which a five [place] dual lumen PICC trimmed to 47 cm was advanced, positioned with its tip at the lower SVC/right atrial junction.  Fluoroscopy during the procedure and fluoro spot radiograph confirms appropriate catheter position.  The catheter was flushed, secured to the skin with Prolene sutures, and covered with a sterile dressing.  Complications:  None immediate  IMPRESSION: Successful left arm PICC line placement with ultrasound and fluoroscopic guidance.  The catheter is ready for use.  Read by: Anselm Pancoast, P.A.-C  Original Report Authenticated By: Richarda Overlie, M.D.   Ir US Guide Vasc Access Left  08/25/2011  *RADIOLOGY REPORT*  Clinical Data: Sickle cell crisis in need of IV access.  PICC LINE PLACEMENT WITH ULTRASOUND AND FLUOROSCOPIC  GUIDANCE  Fluoroscopy Time: 0.2 minutes.  The left arm was prepped with chlorhexidine, draped in the usual sterile fashion using maximum barrier technique (cap and mask, sterile gown, sterile gloves, large sterile sheet, hand hygiene and cutaneous antisepsis) and infiltrated locally with 1% Lidocaine.  Ultrasound demonstrated patency of the left basilic vein, and this was documented with an image.  Under real-time ultrasound guidance, this vein was accessed with a 21 gauge micropuncture needle and image documentation was performed.  The needle was exchanged over a guidewire for a peel-away sheath through which a five [place] dual lumen PICC trimmed to 47 cm was advanced, positioned with its tip at the lower SVC/right atrial junction.  Fluoroscopy during the procedure and fluoro spot radiograph confirms appropriate catheter position.  The catheter was flushed, secured to the skin with  Prolene sutures, and covered with a sterile dressing.  Complications:  None immediate  IMPRESSION: Successful left arm PICC line placement with ultrasound and fluoroscopic guidance.  The catheter is ready for use.  Read by: Anselm Pancoast, P.A.-C  Original Report Authenticated By: Richarda Overlie, M.D.    Positive ROS: All other systems have been reviewed and were otherwise negative with the exception of those mentioned in the HPI and as above.  Physical Exam: General: Alert, no acute distress, lying in bed. Cardiovascular: No pedal edema Respiratory: No cyanosis, no use of accessory musculature GI: No organomegaly, abdomen is soft and non-tender Skin: No lesions in the area of chief complaint Neurologic: Sensation intact distally Psychiatric: Patient is competent for consent with normal mood and affect Lymphatic: No axillary or cervical lymphadenopathy  MUSCULOSKELETAL: Right proximal fibula has well-healed surgical wounds, as well as wounds over his right hip. EHL and FHL are firing on his left leg. His hip range of motion is 0 to 100,  internal rotation to 10, external rotation to 20. Leg lengths are approximately equal.  Assessment: Left hip avascular necrosis, status post right total hip arthroplasty, sickle cell disease  Plan: I discussed the options with him, and he has a chronic problem, that seems to be currently reasonably well managed with pain medications. He will at some point I would expect desire for a left total hip arthroplasty. We have discussed the risks benefits and alternatives of this, particularly in light of his young age, having said that he does have sickle cell disease, as well as avascular necrosis, and would be a reasonable candidate if his symptoms continue to be persistent. He is going to plan to recover from his current crisis, and see me as an outpatient, and we may continue to discussion regarding the option of a left total hip arthroplasty. In the meantime I  would defer his medical management to Dr. August Saucer.  Thank you for this consultation. He can be weightbearing as tolerated and can work with physical therapy if desired.    Eulas Post, MD 08/27/2011 12:14 AM

## 2011-08-27 NOTE — Progress Notes (Signed)
Patient discharged home in stable condition.  Discharge instructions and scripts given with verbal understanding.

## 2011-08-28 ENCOUNTER — Telehealth (HOSPITAL_COMMUNITY): Payer: Self-pay | Admitting: *Deleted

## 2011-08-28 NOTE — Telephone Encounter (Signed)
Follow up call made post hospital discharge 

## 2011-11-07 ENCOUNTER — Encounter (HOSPITAL_COMMUNITY): Payer: Self-pay | Admitting: Emergency Medicine

## 2011-11-07 ENCOUNTER — Emergency Department (HOSPITAL_COMMUNITY): Payer: Medicare Other

## 2011-11-07 ENCOUNTER — Inpatient Hospital Stay (HOSPITAL_COMMUNITY)
Admission: EM | Admit: 2011-11-07 | Discharge: 2011-11-20 | DRG: 812 | Disposition: A | Discharge status: Home / Self Care | Payer: Medicare Other | Attending: Internal Medicine | Admitting: Internal Medicine

## 2011-11-07 DIAGNOSIS — D649 Anemia, unspecified: Secondary | ICD-10-CM

## 2011-11-07 DIAGNOSIS — M87052 Idiopathic aseptic necrosis of left femur: Secondary | ICD-10-CM

## 2011-11-07 DIAGNOSIS — M87029 Idiopathic aseptic necrosis of unspecified humerus: Secondary | ICD-10-CM | POA: Diagnosis present

## 2011-11-07 DIAGNOSIS — F17200 Nicotine dependence, unspecified, uncomplicated: Secondary | ICD-10-CM | POA: Diagnosis present

## 2011-11-07 DIAGNOSIS — Z720 Tobacco use: Secondary | ICD-10-CM | POA: Diagnosis present

## 2011-11-07 DIAGNOSIS — D638 Anemia in other chronic diseases classified elsewhere: Secondary | ICD-10-CM | POA: Diagnosis present

## 2011-11-07 DIAGNOSIS — D5701 Hb-SS disease with acute chest syndrome: Secondary | ICD-10-CM | POA: Diagnosis present

## 2011-11-07 DIAGNOSIS — R7989 Other specified abnormal findings of blood chemistry: Secondary | ICD-10-CM

## 2011-11-07 DIAGNOSIS — Z96649 Presence of unspecified artificial hip joint: Secondary | ICD-10-CM

## 2011-11-07 DIAGNOSIS — D5700 Hb-SS disease with crisis, unspecified: Principal | ICD-10-CM | POA: Diagnosis present

## 2011-11-07 LAB — BASIC METABOLIC PANEL
BUN: 10 mg/dL (ref 6–23)
Calcium: 9.1 mg/dL (ref 8.4–10.5)
Creatinine, Ser: 0.76 mg/dL (ref 0.50–1.35)
GFR calc Af Amer: >90 mL/min (ref >90)
GFR calc non Af Amer: >90 mL/min (ref >90)
Potassium: 3.7 mEq/L (ref 3.5–5.1)

## 2011-11-07 LAB — CBC WITH DIFFERENTIAL/PLATELET
Basophils Relative: 1 % (ref 0–1)
Eosinophils Absolute: 0.6 10*3/uL (ref 0.0–0.7)
Eosinophils Relative: 5 % (ref 0–5)
Hemoglobin: 11 g/dL — ABNORMAL LOW (ref 13.0–17.0)
MCH: 32.3 pg (ref 26.0–34.0)
MCHC: 36.4 g/dL — ABNORMAL HIGH (ref 30.0–36.0)
MCV: 88.6 fL (ref 78.0–100.0)
Monocytes Relative: 9 % (ref 3–12)
Neutrophils Relative %: 61 % (ref 43–77)

## 2011-11-07 MED ORDER — HYDROMORPHONE HCL PF 1 MG/ML IJ SOLN
2.0000 mg | Freq: Once | INTRAMUSCULAR | Status: AC
  Administered 2011-11-07: 2 mg via INTRAVENOUS
  Filled 2011-11-07: qty 2

## 2011-11-07 MED ORDER — SODIUM CHLORIDE 0.9 % IV BOLUS (SEPSIS)
1000.0000 mL | Freq: Once | INTRAVENOUS | Status: AC
  Administered 2011-11-07: 1000 mL via INTRAVENOUS

## 2011-11-07 MED ORDER — DIPHENHYDRAMINE HCL 50 MG/ML IJ SOLN
25.0000 mg | Freq: Once | INTRAMUSCULAR | Status: AC
  Administered 2011-11-07: 25 mg via INTRAVENOUS
  Filled 2011-11-07: qty 2

## 2011-11-07 NOTE — ED Notes (Signed)
IV team paged.  

## 2011-11-07 NOTE — ED Provider Notes (Signed)
History     CSN: [id number]  Arrival date & time 11/07/11  1558   First MD Initiated Contact with Patient 11/07/11 1921      Chief Complaint  Patient presents with  . Sickle Cell Pain Crisis    onset 4-5 days ago, using meds from home w/o relief    (Consider location/radiation/quality/duration/timing/severity/associated sxs/prior treatment) HPI  H/o sickle cell HgbSC presents with chest pain. The patient states this is his typical sickle cell crisis. He describes bilateral anterior lower rib pain. He has right flank pain as well. He rates his pain as a 10 out of 10 at this time. Onset was 3-4 days ago. He's been taking morphine at home both extended release 60 mg twice a day as well as 30 mg tablets when necessary for breakthrough pain. There is been. Minimal relief with this. He denies fevers, chills. He denies cough. He does state that he had history of acute chest syndrome approximately 6 months ago. This does not feel similar. No sick contacts. His triggers include change in weather.   ED Notes, ED Provider Notes from 11/07/11 0000 to 11/07/11 18:17:29       Malik Pyo Spainhour, RN 11/07/2011 16:44      Onset of sickle cell pain 3-4 days ago, bottom of ribs to top of shoulders, thru and thru     Past Medical History  Diagnosis Date  . Sickle cell crisis   . Blood transfusion   . Avascular necrosis of hip     bilateral  . Infection of bone, shoulder region     left shoulder  . Pneumonia   . Avascular necrosis of hip, left 08/27/2011    Past Surgical History  Procedure Date  . Orif right hip fracture 1995  . Joint replacement 2006    right total hip arthroplasty    No family history on file.  History  Substance Use Topics  . Smoking status: Current Everyday Smoker -- 0.5 packs/day    Types: Cigarettes  . Smokeless tobacco: Never Used  . Alcohol Use: No    Review of Systems  All other systems reviewed and are negative.   except as noted HPI    Allergies    Review of patient's allergies indicates no known allergies.  Home Medications   Current Outpatient Rx  Name Route Sig Dispense Refill  . DICLOFENAC SODIUM 1 % TD GEL Topical Apply 1 application topically 4 (four) times daily.    Marland Kitchen FOLIC ACID 1 MG PO TABS Oral Take 1 tablet (1 mg total) by mouth daily.    . MORPHINE SULFATE ER 60 MG PO TB12 Oral Take 1 tablet (60 mg total) by mouth 2 (two) times daily. 40 tablet 0  . MORPHINE SULFATE 30 MG PO TABS Oral Take 1 tablet (30 mg total) by mouth every 4 (four) hours as needed for pain. Breakthrough pain 60 tablet 0  . PROMETHAZINE HCL 25 MG PO TABS Oral Take 1 tablet (25 mg total) by mouth every 6 (six) hours as needed. 60 tablet 0    BP 119/75  Pulse 54  Temp 97.7 F (36.5 C) (Oral)  Resp 19  SpO2 98%  Physical Exam  Nursing note and vitals reviewed. Constitutional: He is oriented to person, place, and time. He appears well-developed and well-nourished. No distress.  HENT:  Head: Atraumatic.  Mouth/Throat: Oropharynx is clear and moist.  Eyes: Conjunctivae are normal. Pupils are equal, round, and reactive to light.  Neck: Neck supple.  Cardiovascular: Normal rate, regular rhythm, normal heart sounds and intact distal pulses.  Exam reveals no gallop and no friction rub.   No murmur heard. Pulmonary/Chest: Effort normal. No respiratory distress. He has no wheezes. He has no rales. He exhibits tenderness.       +Right lower lung field crackles  Abdominal: Soft. Bowel sounds are normal. There is no tenderness. There is no rebound and no guarding.  Musculoskeletal: Normal range of motion. He exhibits no edema and no tenderness.  Neurological: He is alert and oriented to person, place, and time.  Skin: Skin is warm and dry.  Psychiatric: He has a normal mood and affect.    Date: 11/07/2011  Rate: 64  Rhythm: normal sinus rhythm  QRS Axis: normal  Intervals: normal  ST/T Wave abnormalities: normal  Conduction Disutrbances:none   Narrative Interpretation:   Old EKG Reviewed: no sig change  ED Course  Procedures (including critical care time)  Labs Reviewed  CBC WITH DIFFERENTIAL - Abnormal; Notable for the following:    WBC 11.2 (*)     RBC 3.41 (*)     Hemoglobin 11.0 (*)     HCT 30.2 (*)     MCHC 36.4 (*)     All other components within normal limits  RETICULOCYTES - Abnormal; Notable for the following:    RBC. 3.41 (*)     All other components within normal limits  BASIC METABOLIC PANEL   Dg Chest 2 View  11/07/2011  *RADIOLOGY REPORT*  Clinical Data: Chest pain.  Lower rib pain.  Sickle cell crisis.  CHEST - 2 VIEW  Comparison: 08/20/2011.  Findings: Normal cardiac and mediastinal silhouette.  Normal vascularity.  Prominent lung markings due to scarring.  No acute infiltrate is seen.  There is no effusion or pneumothorax. Similar appearance to priors.  IMPRESSION: Stable chest.  Chronic lung disease.  Original Report Authenticated By: Elsie Stain, M.D.    1. Sickle cell crisis     MDM  PW what he states is a typical crisis. Denies cough/sob/fever/chills. He has min R lower lung field crackles. CXR unremarkable. Initially no hypoxia. Noted to be 92%RA my assessment after dilaudid. hgb baseline. His pain remains uncontrolled. DW Patient. D/W [facility] Dr. Lovell Sheehan. Admit to tele.        Forbes Cellar, MD 11/08/11 0110

## 2011-11-07 NOTE — ED Notes (Signed)
IV team to obtain labs with the start of IV per RN

## 2011-11-07 NOTE — ED Notes (Signed)
Onset of sickle cell pain 3-4 days ago, bottom of ribs to top of shoulders, thru and thru

## 2011-11-07 NOTE — ED Notes (Signed)
IV team RN stated that there are multiple ahead of pt.

## 2011-11-08 ENCOUNTER — Encounter (HOSPITAL_COMMUNITY): Payer: Self-pay | Admitting: Internal Medicine

## 2011-11-08 DIAGNOSIS — D638 Anemia in other chronic diseases classified elsewhere: Secondary | ICD-10-CM | POA: Diagnosis present

## 2011-11-08 DIAGNOSIS — D5701 Hb-SS disease with acute chest syndrome: Secondary | ICD-10-CM

## 2011-11-08 DIAGNOSIS — D5700 Hb-SS disease with crisis, unspecified: Principal | ICD-10-CM

## 2011-11-08 DIAGNOSIS — F17200 Nicotine dependence, unspecified, uncomplicated: Secondary | ICD-10-CM

## 2011-11-08 DIAGNOSIS — D649 Anemia, unspecified: Secondary | ICD-10-CM

## 2011-11-08 LAB — BASIC METABOLIC PANEL
BUN: 9 mg/dL (ref 6–23)
Calcium: 8.4 mg/dL (ref 8.4–10.5)
Creatinine, Ser: 0.77 mg/dL (ref 0.50–1.35)
GFR calc Af Amer: >90 mL/min (ref >90)
GFR calc non Af Amer: >90 mL/min (ref >90)
Glucose, Bld: 93 mg/dL (ref 70–99)
Potassium: 3.7 mEq/L (ref 3.5–5.1)

## 2011-11-08 LAB — CARDIAC PANEL(CRET KIN+CKTOT+MB+TROPI)
CK, MB: 0.9 ng/mL (ref 0.3–4.0)
CK, MB: 0.8 ng/mL (ref 0.3–4.0)
Relative Index: INVALID (ref 0.0–2.5)
Total CK: 69 U/L (ref 7–232)
Troponin I: <0.3 ng/mL (ref <0.30)
Troponin I: <0.3 ng/mL (ref <0.30)

## 2011-11-08 MED ORDER — DIPHENHYDRAMINE HCL 50 MG/ML IJ SOLN
25.0000 mg | Freq: Four times a day (QID) | INTRAMUSCULAR | Status: DC | PRN
  Administered 2011-11-08 – 2011-11-20 (×28): 25 mg via INTRAVENOUS
  Filled 2011-11-08 (×28): qty 1

## 2011-11-08 MED ORDER — ONDANSETRON HCL 4 MG/2ML IJ SOLN
4.0000 mg | Freq: Once | INTRAMUSCULAR | Status: AC
  Administered 2011-11-08: 4 mg via INTRAVENOUS
  Filled 2011-11-08: qty 2

## 2011-11-08 MED ORDER — NICOTINE 14 MG/24HR TD PT24
14.0000 mg | MEDICATED_PATCH | Freq: Every day | TRANSDERMAL | Status: DC
  Administered 2011-11-08 – 2011-11-20 (×13): 14 mg via TRANSDERMAL
  Filled 2011-11-08 (×13): qty 1

## 2011-11-08 MED ORDER — HYDROMORPHONE HCL PF 2 MG/ML IJ SOLN
2.0000 mg | INTRAMUSCULAR | Status: DC | PRN
  Administered 2011-11-08 (×6): 3 mg via INTRAVENOUS
  Filled 2011-11-08 (×7): qty 2

## 2011-11-08 MED ORDER — ALUM & MAG HYDROXIDE-SIMETH 200-200-20 MG/5ML PO SUSP: 15.0000 mL | ORAL | Status: DC | PRN

## 2011-11-08 MED ORDER — PANTOPRAZOLE SODIUM 40 MG IV SOLR
40.0000 mg | Freq: Every day | INTRAVENOUS | Status: DC
  Administered 2011-11-19: 40 mg via INTRAVENOUS
  Filled 2011-11-08 (×13): qty 40

## 2011-11-08 MED ORDER — PROMETHAZINE HCL 25 MG PO TABS
12.5000 mg | ORAL_TABLET | ORAL | Status: DC | PRN
  Administered 2011-11-08 – 2011-11-17 (×22): 25 mg via ORAL
  Filled 2011-11-08: qty 2
  Filled 2011-11-08 (×22): qty 1

## 2011-11-08 MED ORDER — PANTOPRAZOLE SODIUM 40 MG PO TBEC
40.0000 mg | DELAYED_RELEASE_TABLET | Freq: Every day | ORAL | Status: DC
  Administered 2011-11-08 – 2011-11-20 (×12): 40 mg via ORAL
  Filled 2011-11-08 (×13): qty 1

## 2011-11-08 MED ORDER — ENOXAPARIN SODIUM 40 MG/0.4ML ~~LOC~~ SOLN
40.0000 mg | SUBCUTANEOUS | Status: DC
Start: 2011-11-08 — End: 2011-11-20
  Administered 2011-11-08 – 2011-11-20 (×10): 40 mg via SUBCUTANEOUS
  Filled 2011-11-08 (×13): qty 0.4

## 2011-11-08 MED ORDER — FOLIC ACID 1 MG PO TABS
1.0000 mg | ORAL_TABLET | Freq: Every day | ORAL | Status: DC
  Administered 2011-11-08 – 2011-11-20 (×13): 1 mg via ORAL
  Filled 2011-11-08 (×13): qty 1

## 2011-11-08 MED ORDER — HYDROMORPHONE HCL PF 4 MG/ML IJ SOLN
2.0000 mg | INTRAMUSCULAR | Status: DC | PRN
  Administered 2011-11-08: 3 mg via INTRAVENOUS
  Administered 2011-11-09 (×5): 4 mg via INTRAVENOUS
  Administered 2011-11-09: 3 mg via INTRAVENOUS
  Administered 2011-11-10 – 2011-11-13 (×21): 4 mg via INTRAVENOUS
  Filled 2011-11-08 (×27): qty 1

## 2011-11-08 MED ORDER — DEXTROSE-NACL 5-0.45 % IV SOLN
INTRAVENOUS | Status: DC
  Administered 2011-11-08 – 2011-11-13 (×14): via INTRAVENOUS
  Administered 2011-11-13 – 2011-11-14 (×3): 125 mL/h via INTRAVENOUS
  Administered 2011-11-14 – 2011-11-20 (×11): via INTRAVENOUS

## 2011-11-08 MED ORDER — HYDROMORPHONE HCL PF 1 MG/ML IJ SOLN
2.0000 mg | Freq: Once | INTRAMUSCULAR | Status: AC
  Administered 2011-11-08: 2 mg via INTRAVENOUS
  Filled 2011-11-08: qty 2

## 2011-11-08 MED ORDER — OXYCODONE HCL 5 MG PO TABS
10.0000 mg | ORAL_TABLET | Freq: Four times a day (QID) | ORAL | Status: DC | PRN
  Administered 2011-11-12 – 2011-11-17 (×4): 10 mg via ORAL
  Filled 2011-11-08: qty 1
  Filled 2011-11-08 (×3): qty 2
  Filled 2011-11-08: qty 1

## 2011-11-08 MED ORDER — SODIUM CHLORIDE 0.9 % IV SOLN: INTRAVENOUS | Status: AC

## 2011-11-08 MED ORDER — PROMETHAZINE HCL 12.5 MG RE SUPP
12.5000 mg | RECTAL | Status: DC | PRN
  Filled 2011-11-08: qty 2

## 2011-11-08 NOTE — H&P (Signed)
DATE OF ADMISSION:  11/08/2011  PCP:    Dorrene German, MD   Chief Complaint:   Chest Pain   HPI: [name] is an 36 y.o. male with sickle Cell [place] Disease who presents to the ED with complaints of Left sided sharp Chest pain rated at a 10 /10 occurring intermittently X 1 week.  Patient reports having no relief with his home medications.    Past Medical History  Diagnosis Date  . Sickle cell crisis   . Blood transfusion   . Avascular necrosis of hip     bilateral  . Infection of bone, shoulder region     left shoulder  . Pneumonia   . Avascular necrosis of hip, left 08/27/2011    Past Surgical History  Procedure Date  . Orif right hip fracture 1995  . Joint replacement 2006    right total hip arthroplasty    Medications:  HOME MEDS: Prior to Admission medications   Medication Sig Start Date End Date Taking? Authorizing Provider  diclofenac sodium (VOLTAREN) 1 % GEL Apply 1 application topically 4 (four) times daily. 08/27/11  Yes Keitha Butte, NP  folic acid (FOLVITE) 1 MG tablet Take 1 tablet (1 mg total) by mouth daily. 05/22/11 05/21/12 Yes Keitha Butte, NP  morphine (MS CONTIN) 60 MG 12 hr tablet Take 1 tablet (60 mg total) by mouth 2 (two) times daily. 05/22/11  Yes Keitha Butte, NP  morphine (MSIR) 30 MG tablet Take 1 tablet (30 mg total) by mouth every 4 (four) hours as needed for pain. Breakthrough pain 08/27/11  Yes Keitha Butte, NP  promethazine (PHENERGAN) 25 MG tablet Take 1 tablet (25 mg total) by mouth every 6 (six) hours as needed. 08/27/11  Yes Keitha Butte, NP    Allergies:  No Known Allergies  Social History:   reports that he has been smoking Cigarettes.  He has been smoking about .5 packs per day. He has never used smokeless tobacco. He reports that he does not drink alcohol or use illicit drugs.  Family History: No family history on file.  Review of Systems: Positive  For:  Chest pain,  The patient denies anorexia,  fever, weight loss, vision loss, decreased hearing, hoarseness, syncope, dyspnea on exertion, peripheral edema, balance deficits, hemoptysis, abdominal pain, melena, hematochezia, severe indigestion/heartburn, hematuria, incontinence, genital sores, muscle weakness, suspicious skin lesions, transient blindness, difficulty walking, depression, unusual weight change, abnormal bleeding, enlarged lymph nodes, angioedema, and breast masses.   Physical Exam:  GEN:  Pleasant 36 year old well nourished and well developed African American male examined and in discomfort but no acute distress; cooperative with exam Filed Vitals:   11/07/11 1632 11/07/11 1926 11/07/11 2334 11/08/11 0234  BP: 114/84 129/87 119/75 126/83  Pulse: 81 71 54 67  Temp: 98.7 F (37.1 C) 97.7 F (36.5 C)  97.7 F (36.5 C)  TempSrc: Oral Oral  Oral  Resp:  20 19 18   Height:    6\' 2"  (1.88 m)  Weight:    81.285 kg (179 lb 3.2 oz)  SpO2: 95% 98% 98% 90%   Blood pressure 126/83, pulse 67, temperature 97.7 F (36.5 C), temperature source Oral, resp. rate 18, height 6\' 2"  (1.88 m), weight 81.285 kg (179 lb 3.2 oz), SpO2 90.00%. PSYCH: He is alert and oriented x4; does not appear anxious does not appear depressed; affect is normal HEENT: Normocephalic and Atraumatic, Mucous membranes pink; PERRLA; EOM intact; Fundi:  Benign;  No  scleral icterus, Nares: Patent, Oropharynx: Clear, Fair Dentition, Neck:  FROM, no cervical lymphadenopathy nor thyromegaly or carotid bruit; no JVD; Breasts:: Not examined CHEST WALL: No tenderness CHEST: Normal respiration, clear to auscultation bilaterally HEART: Regular rate and rhythm; no murmurs rubs or gallops BACK: No kyphosis or scoliosis; no CVA tenderness ABDOMEN: Positive Bowel Sounds, Scaphoid, soft non-tender; no masses, no organomegaly.   Rectal Exam: Not done EXTREMITIES: No bone or joint deformity; age-appropriate arthropathy of the hands and knees; no cyanosis, clubbing or edema; no  ulcerations. Genitalia: not examined PULSES: 2+ and symmetric SKIN: Normal hydration no rash or ulceration CNS: Cranial nerves 2-12 grossly intact no focal neurologic deficit   Labs & Imaging Results for orders placed during the hospital encounter of 11/07/11 (from the past 48 hour(s))  CBC WITH DIFFERENTIAL     Status: Abnormal   Collection Time   11/07/11 10:08 PM      Component Value Range Comment   WBC 11.2 (*) 4.0 - 10.5 K/uL    RBC 3.41 (*) 4.22 - 5.81 MIL/uL    Hemoglobin 11.0 (*) 13.0 - 17.0 g/dL    HCT 16.1 (*) 09.6 - 52.0 %    MCV 88.6  78.0 - 100.0 fL    MCH 32.3  26.0 - 34.0 pg    MCHC 36.4 (*) 30.0 - 36.0 g/dL    RDW 04.5  40.9 - 81.1 %    Platelets 275  150 - 400 K/uL    Neutrophils Relative 61  43 - 77 %    Neutro Abs 6.8  1.7 - 7.7 K/uL    Lymphocytes Relative 25  12 - 46 %    Lymphs Abs 2.8  0.7 - 4.0 K/uL    Monocytes Relative 9  3 - 12 %    Monocytes Absolute 1.0  0.1 - 1.0 K/uL    Eosinophils Relative 5  0 - 5 %    Eosinophils Absolute 0.6  0.0 - 0.7 K/uL    Basophils Relative 1  0 - 1 %    Basophils Absolute 0.1  0.0 - 0.1 K/uL   BASIC METABOLIC PANEL     Status: Normal   Collection Time   11/07/11 10:08 PM      Component Value Range Comment   Sodium 138  135 - 145 mEq/L    Potassium 3.7  3.5 - 5.1 mEq/L    Chloride 101  96 - 112 mEq/L    CO2 27  19 - 32 mEq/L    Glucose, Bld 88  70 - 99 mg/dL    BUN 10  6 - 23 mg/dL    Creatinine, Ser 9.14  0.50 - 1.35 mg/dL    Calcium 9.1  8.4 - 78.2 mg/dL    GFR calc non Af Amer >90  >90 mL/min    GFR calc Af Amer >90  >90 mL/min   RETICULOCYTES     Status: Abnormal   Collection Time   11/07/11 10:08 PM      Component Value Range Comment   Retic Ct Pct 2.3  0.4 - 3.1 %    RBC. 3.41 (*) 4.22 - 5.81 MIL/uL    Retic Count, Manual 78.4  19.0 - 186.0 K/uL    Dg Chest 2 View  11/07/2011  *RADIOLOGY REPORT*  Clinical Data: Chest pain.  Lower rib pain.  Sickle cell crisis.  CHEST - 2 VIEW  Comparison: 08/20/2011.   Findings: Normal cardiac and mediastinal silhouette.  Normal vascularity.  Prominent lung markings due to scarring.  No acute infiltrate is seen.  There is no effusion or pneumothorax. Similar appearance to priors.  IMPRESSION: Stable chest.  Chronic lung disease.  Original Report Authenticated By: Elsie Stain, M.D.    EKG:  Normal Sinus Rhythm, No acute ST segment changes   Assessment: Present on Admission:  .Sickle cell anemia with crisis .Acute chest syndrome .Anemia .Tobacco abuse   Plan:    Pain Control  Cardiac Enzymes Oxygen Tobacco Cessation counseled DVT prophylaxis Other plans as per orders.    CODE STATUS:      FULL CODE       Vin Yonke C 11/08/2011, 2:41 AM

## 2011-11-08 NOTE — Progress Notes (Signed)
Pt c/o 9/10 chest soreness. No pain medications ordered. MD paged. Awaiting return call. Will continue to monitor.

## 2011-11-08 NOTE — Progress Notes (Signed)
Orders placed in Epic for pain medication and Benadryl. Pt asleep at this time. Will reassess pain in 30 minutes.

## 2011-11-08 NOTE — ED Notes (Signed)
Pt had [place] sandwich, cheese stick and a gingerale

## 2011-11-08 NOTE — Progress Notes (Signed)
I have seen and examined pt with history significant for  Sickle cell [place] disease admitted by Dr Lovell Sheehan today with L. Sided chest pain, last discharged form Dr Diamantina Providence service on 4/25 for sickle cell anemia with pain crisis. On follow up today pt still with left sided chest pain, denies SOB, CXR with no acute infiltrate and cardiac enzymes so far neg. Will change IVF to 1/2NS, continue pain management, current management plan as per Dr Lovell Sheehan, follow up on am CXR and further management as clinically appropriate. Pt has been transferred to Dr Diamantina Providence service to begin seeing in am and I have sent a message to his web page and also notified Dr Sharyn Lull on call for him.  Donnalee Curry MD [phone]

## 2011-11-08 NOTE — ED Notes (Signed)
Pt asked for sandwich and was instructed to wait until zofran helped with nausea pt had reported earlier. Pt asked tech shortly after and ate 100% of sandwich and had ginger ale. Pt watching TV. No distress noted.

## 2011-11-08 NOTE — ED Notes (Signed)
Pt stated that nausea is better

## 2011-11-09 LAB — COMPREHENSIVE METABOLIC PANEL
BUN: 7 mg/dL (ref 6–23)
CO2: 28 mEq/L (ref 19–32)
Calcium: 9.3 mg/dL (ref 8.4–10.5)
Creatinine, Ser: 0.79 mg/dL (ref 0.50–1.35)
GFR calc Af Amer: >90 mL/min (ref >90)
GFR calc non Af Amer: >90 mL/min (ref >90)
Glucose, Bld: 90 mg/dL (ref 70–99)

## 2011-11-09 LAB — CBC
Hemoglobin: 10.1 g/dL — ABNORMAL LOW (ref 13.0–17.0)
MCH: 32.2 pg (ref 26.0–34.0)
MCV: 90.1 fL (ref 78.0–100.0)
RBC: 3.14 MIL/uL — ABNORMAL LOW (ref 4.22–5.81)

## 2011-11-09 LAB — PRO B NATRIURETIC PEPTIDE: Pro B Natriuretic peptide (BNP): 401.2 pg/mL — ABNORMAL HIGH (ref 0–125)

## 2011-11-09 NOTE — Progress Notes (Signed)
Subjective: Mr. Buttram was seen on rounds today. He was lying quietly in bed in no acute distress until he tried to adjust self in bed continues to have pain with deep inspiration , side and back rates pain 7/10. (Also, diffuse abdominal pain )  Objective: Vital signs in last 24 hours: Filed Vitals:   11/08/11 2140 11/09/11 0542 11/09/11 0900 11/09/11 1414  BP: 122/81 113/71 128/85 124/83  Pulse: 67 61 65 70  Temp: 98.2 F (36.8 C) 98.1 F (36.7 C) 97.7 F (36.5 C) 98.2 F (36.8 C)  TempSrc: Oral Oral Oral Oral  Resp: 18 18 18 18   Height:      Weight:      SpO2: 95% 98% 97% 96%   Weight change:   Intake/Output Summary (Last 24 hours) at 11/09/11 1709 Last data filed at 11/09/11 1500  Gross per 24 hour  Intake   2860 ml  Output   8650 ml  Net  -5790 ml    Physical Exam: BP 124/83  Pulse 70  Temp 98.2 F (36.8 C) (Oral)  Resp 18  Ht 6\' 2"  (1.88 m)  Wt 81.285 kg (179 lb 3.2 oz)  BMI 23.01 kg/m2  SpO2 96% General Appearance: Alert and oriented, cooperative, well developed, well nourished, acute distress with self  repositioning  Head: Normocephalic, atraumatic  Eyes: PERRLA, EOMI, scleral icterus  Nose: No sinus tenderness, no visible drainage, septum midline, normal mucosa  Throat: Lips, mucosa and tongue are normal, gums appear normal, dentition good condition  Neck: No adenopathy and supple, symmetrical, trachea midline  Back: Symmetric, no curvature, ROM normal, diffuse tenderness, bilateral CVA tenderness  Resp: CTA bilaterally, no wheezes/rales/rhonchi  Cardio: S1, S2 normal, no murmur/click/rub/gallop  GI: Soft, diffuse tenderness, slightly distended, bowel sounds present  Male Genitalia: Deferred, Denies  priapism Extremities: Normal, atraumatic, no cyanosis, Homans sign is negative,  no edema  Neurologic: CN II - XII intact  Psych: Appropriate affect   Lab Results:  Basename 11/09/11 0452 11/08/11 0303  NA 138 139  K 4.0 3.7  CL 102 105  CO2 28 25    GLUCOSE 90 93  BUN 7 9  CREATININE 0.79 0.77  CALCIUM 9.3 8.4  MG -- --  PHOS -- --    Basename 11/09/11 0452  AST 21  ALT 10  ALKPHOS 77  BILITOT 2.2*  PROT 7.3  ALBUMIN 4.1   No results found for this basename: LIPASE:2,AMYLASE:2 in the last 72 hours  Basename 11/09/11 0452 11/07/11 2208  WBC 9.6 11.2*  NEUTROABS -- 6.8  HGB 10.1* 11.0*  HCT 28.3* 30.2*  MCV 90.1 88.6  PLT 247 275    Basename 11/08/11 1409 11/08/11 0814 11/08/11 0303  CKTOTAL 69 50 41  CKMB 0.8 0.9 0.9  CKMBINDEX -- -- --  TROPONINI <0.30 <0.30 <0.30   No components found with this basename: POCBNP:3 No results found for this basename: DDIMER:2 in the last 72 hours No results found for this basename: HGBA1C:2 in the last 72 hours No results found for this basename: CHOL:2,HDL:2,LDLCALC:2,TRIG:2,CHOLHDL:2,LDLDIRECT:2 in the last 72 hours No results found for this basename: TSH,T4TOTAL,FREET3,T3FREE,THYROIDAB in the last 72 hours  Basename 11/09/11 0452 11/07/11 2208  VITAMINB12 -- --  FOLATE -- --  FERRITIN 758* --  TIBC -- --  IRON -- --  RETICCTPCT 2.0 2.3    Micro Results: No results found for this or any previous visit (from the past 240 hour(s)).  Studies/Results: Dg Chest 2 View  11/07/2011  *RADIOLOGY  REPORT*  Clinical Data: Chest pain.  Lower rib pain.  Sickle cell crisis.  CHEST - 2 VIEW  Comparison: 08/20/2011.  Findings: Normal cardiac and mediastinal silhouette.  Normal vascularity.  Prominent lung markings due to scarring.  No acute infiltrate is seen.  There is no effusion or pneumothorax. Similar appearance to priors.  IMPRESSION: Stable chest.  Chronic lung disease.  Original Report Authenticated By: Elsie Stain, M.D.    Medications: Scheduled Meds:   . sodium chloride   Intravenous STAT  . enoxaparin (LOVENOX) injection  40 mg Subcutaneous Q24H  . folic acid  1 mg Oral Daily  . nicotine  14 mg Transdermal Daily  . pantoprazole  40 mg Oral Q1200   Or  .  pantoprazole (PROTONIX) IV  40 mg Intravenous Q1200   Continuous Infusions:   . dextrose 5 % and 0.45% NaCl 125 mL/hr at 11/09/11 1054   PRN Meds:.alum & mag hydroxide-simeth, diphenhydrAMINE, HYDROmorphone (DILAUDID) injection, oxyCODONE, promethazine, promethazine, DISCONTD:  HYDROmorphone (DILAUDID) injection  Assessment/Plan: Principal Problem: Sickle cell anemia with crisis IVF , pain ,pruritis bowel management and DVT prophylaxis,   Active Problems:  Tobacco abuse nicotine patch   Anemia secondary to SCD     LOS: 2 days  [name] 11/09/2011, 5:09 PM

## 2011-11-09 NOTE — Progress Notes (Signed)
   CARE MANAGEMENT NOTE 11/09/2011  Patient:  [name], [name]   Account Number:  [id number]  Date Initiated:  11/09/2011  Documentation initiated by:  Jiles Crocker  Subjective/Objective Assessment:   ADMITTED WITH SICKLE CELL CRISIS WITH CHEST PAIN     Action/Plan:   PCP:  Dorrene German, MD;  INDEPENDENT PRIOR TO ADMISSION   Anticipated DC Date:  11/16/2011   Anticipated DC Plan:  HOME/SELF CARE      DC Planning Services  CM consult               Status of service:  In process, will continue to follow Medicare Important Message given?  NA - LOS <3 / Initial given by admissions (If response is "NO", the following Medicare IM given date fields will be blank)  Per UR Regulation:  Reviewed for med. necessity/level of care/duration of stay  Comments:  11/09/2011- B Miliana Gangwer RN, BSN, MHA

## 2011-11-10 ENCOUNTER — Inpatient Hospital Stay (HOSPITAL_COMMUNITY): Payer: Medicare Other

## 2011-11-10 LAB — CBC WITH DIFFERENTIAL/PLATELET
Basophils Absolute: 0.1 10*3/uL (ref 0.0–0.1)
HCT: 28.9 % — ABNORMAL LOW (ref 39.0–52.0)
Hemoglobin: 10.6 g/dL — ABNORMAL LOW (ref 13.0–17.0)
Lymphocytes Relative: 22 % (ref 12–46)
Monocytes Absolute: 0.9 10*3/uL (ref 0.1–1.0)
Monocytes Relative: 8 % (ref 3–12)
Neutro Abs: 6.2 10*3/uL (ref 1.7–7.7)
Neutrophils Relative %: 58 % (ref 43–77)
RDW: 15.1 % (ref 11.5–15.5)
WBC: 10.8 10*3/uL — ABNORMAL HIGH (ref 4.0–10.5)

## 2011-11-10 LAB — COMPREHENSIVE METABOLIC PANEL
ALT: 10 U/L (ref 0–53)
AST: 24 U/L (ref 0–37)
Alkaline Phosphatase: 83 U/L (ref 39–117)
CO2: 27 mEq/L (ref 19–32)
Chloride: 100 mEq/L (ref 96–112)
Creatinine, Ser: 0.74 mg/dL (ref 0.50–1.35)
GFR calc non Af Amer: >90 mL/min (ref >90)
Potassium: 3.6 mEq/L (ref 3.5–5.1)
Total Bilirubin: 2.6 mg/dL — ABNORMAL HIGH (ref 0.3–1.2)

## 2011-11-10 MED ORDER — LACTULOSE 10 GM/15ML PO SOLN
20.0000 g | Freq: Two times a day (BID) | ORAL | Status: DC
  Administered 2011-11-10 – 2011-11-20 (×20): 20 g via ORAL
  Filled 2011-11-10 (×22): qty 30

## 2011-11-10 NOTE — Progress Notes (Signed)
Nurse extern noted IV tubing had been removed from IV pump.  Line intact from bag to pt's IV, but no longer in pump and pump turned off.  Pt denies tampering with IV pump, states he is unsure how it got that way.  Educated pt on importance of not manipulating equipment, risk of infection or errors, etc.  He stated understanding.  IV tubing reinserted in pump and pump reprogrammed as ordered for IVF delivery.  Will monitor closely.  Ardyth Gal, RN 11/10/2011

## 2011-11-10 NOTE — Progress Notes (Deleted)
Patient identified from high risk for readmission report.  Chart review complete. Patient discharged to home today prior to bedside encounter.  Patient will receive a transition of care call to determine the need for chronic disease management services to include monthly home visits for disease process and medication management.  For any additional questions or new referrals please contact Anibal Henderson BSN RN [facility] Liaison at [phone].

## 2011-11-10 NOTE — Progress Notes (Signed)
Subjective: [name] was seen on rounds today. He was lying quietly in bed in mild distress  continues to have pain with deep inspiration , right shoulder ,side and back rates pain 7/10. (Also, diffuse abdominal pain ) Decrease ROM in right arm/soulder   Objective: Vital signs in last 24 hours: Filed Vitals:   11/09/11 2056 11/10/11 0130 11/10/11 0517 11/10/11 1414  BP: 130/80 137/82 134/85 140/88  Pulse: 59 70 64 66  Temp: 98.6 F (37 C) 98.4 F (36.9 C) 97.7 F (36.5 C) 98.5 F (36.9 C)  TempSrc: Oral Oral Oral Oral  Resp: 18 18 18 18   Height:      Weight:   79.2 kg (174 lb 9.7 oz)   SpO2: 95% 91% 96% 98%   Weight change:   Intake/Output Summary (Last 24 hours) at 11/10/11 1607 Last data filed at 11/10/11 0910  Gross per 24 hour  Intake   1780 ml  Output   2700 ml  Net   -920 ml    Physical Exam: BP 140/88  Pulse 66  Temp 98.5 F (36.9 C) (Oral)  Resp 18  Ht 6\' 2"  (1.88 m)  Wt 79.2 kg (174 lb 9.7 oz)  BMI 22.42 kg/m2  SpO2 98% General Appearance: Alert and oriented, cooperative, well developed, well nourished, acute distress  Head: Normocephalic, atraumatic  Eyes: PERRLA, EOMI, scleral icterus  Nose: No sinus tenderness, no visible drainage, septum midline, normal mucosa  Throat: Lips, mucosa and tongue are normal, gums appear normal, dentition good condition  Neck: No adenopathy and supple, symmetrical, trachea midline  Back: Symmetric, no curvature, ROM normal, diffuse tenderness, bilateral CVA tenderness  Resp: CTA bilaterally, no wheezes/rales/rhonchi  Cardio: S1, S2 normal, no murmur/click/rub/gallop  GI: Soft, diffuse tenderness, slightly distended, bowel sounds present  Male Genitalia: Deferred, Denies  priapism Extremities: Normal, atraumatic, no cyanosis, Homans sign is negative,  no edema  Neurologic: CN II - XII intact  Psych: Appropriate affect   Lab Results:  Basename 11/10/11 1330 11/09/11 0452  NA 135 138  K 3.6 4.0  CL 100 102  CO2 27  28  GLUCOSE 127* 90  BUN 8 7  CREATININE 0.74 0.79  CALCIUM 9.5 9.3  MG -- --  PHOS -- --    Basename 11/10/11 1330 11/09/11 0452  AST 24 21  ALT 10 10  ALKPHOS 83 77  BILITOT 2.6* 2.2*  PROT 7.8 7.3  ALBUMIN 4.1 4.1   No results found for this basename: LIPASE:2,AMYLASE:2 in the last 72 hours  Basename 11/10/11 1330 11/09/11 0452 11/07/11 2208  WBC 10.8* 9.6 --  NEUTROABS 6.2 -- 6.8  HGB 10.6* 10.1* --  HCT 28.9* 28.3* --  MCV 88.1 90.1 --  PLT 253 247 --    Basename 11/08/11 1409 11/08/11 0814 11/08/11 0303  CKTOTAL 69 50 41  CKMB 0.8 0.9 0.9  CKMBINDEX -- -- --  TROPONINI <0.30 <0.30 <0.30   No components found with this basename: POCBNP:3 No results found for this basename: DDIMER:2 in the last 72 hours No results found for this basename: HGBA1C:2 in the last 72 hours No results found for this basename: CHOL:2,HDL:2,LDLCALC:2,TRIG:2,CHOLHDL:2,LDLDIRECT:2 in the last 72 hours No results found for this basename: TSH,T4TOTAL,FREET3,T3FREE,THYROIDAB in the last 72 hours  Basename 11/09/11 0452 11/07/11 2208  VITAMINB12 -- --  FOLATE -- --  FERRITIN 758* --  TIBC -- --  IRON -- --  RETICCTPCT 2.0 2.3    Micro Results: No results found for this or any  previous visit (from the past 240 hour(s)).  Studies/Results: No results found.  Medications: Scheduled Meds:    . enoxaparin (LOVENOX) injection  40 mg Subcutaneous Q24H  . folic acid  1 mg Oral Daily  . lactulose  20 g Oral BID  . nicotine  14 mg Transdermal Daily  . pantoprazole  40 mg Oral Q1200   Or  . pantoprazole (PROTONIX) IV  40 mg Intravenous Q1200   Continuous Infusions:    . dextrose 5 % and 0.45% NaCl 125 mL/hr at 11/10/11 1344   PRN Meds:.alum & mag hydroxide-simeth, diphenhydrAMINE, HYDROmorphone (DILAUDID) injection, oxyCODONE, promethazine, promethazine  Assessment/Plan: Principal Problem: Sickle cell anemia with crisis IVF , pain ,pruritis bowel management and DVT prophylaxis,   (reorder chest x-ray) Active Problems:  Tobacco abuse nicotine patch   Anemia secondary to SCD Right shoulder pain- xray     LOS: 3 days  EDWARDS, MICHELLE P 11/10/2011, 4:07 PM

## 2011-11-11 LAB — CBC WITH DIFFERENTIAL/PLATELET
Basophils Absolute: 0 10*3/uL (ref 0.0–0.1)
Basophils Relative: 0 % (ref 0–1)
Eosinophils Absolute: 1.1 10*3/uL — ABNORMAL HIGH (ref 0.0–0.7)
Eosinophils Relative: 10 % — ABNORMAL HIGH (ref 0–5)
MCH: 32.7 pg (ref 26.0–34.0)
MCHC: 36.6 g/dL — ABNORMAL HIGH (ref 30.0–36.0)
MCV: 89.3 fL (ref 78.0–100.0)
Neutrophils Relative %: 56 % (ref 43–77)
Platelets: 267 10*3/uL (ref 150–400)
RBC: 3.09 MIL/uL — ABNORMAL LOW (ref 4.22–5.81)
RDW: 15.3 % (ref 11.5–15.5)

## 2011-11-11 LAB — COMPREHENSIVE METABOLIC PANEL
ALT: 9 U/L (ref 0–53)
Albumin: 3.9 g/dL (ref 3.5–5.2)
Alkaline Phosphatase: 80 U/L (ref 39–117)
Calcium: 9.4 mg/dL (ref 8.4–10.5)
GFR calc Af Amer: >90 mL/min (ref >90)
Potassium: 3.8 mEq/L (ref 3.5–5.1)
Sodium: 138 mEq/L (ref 135–145)
Total Protein: 7.5 g/dL (ref 6.0–8.3)

## 2011-11-11 LAB — HEMOGLOBINOPATHY EVALUATION
Hgb A2 Quant: 2.9 % (ref 2.2–3.2)
Hgb F Quant: 3.1 % — ABNORMAL HIGH (ref 0.0–2.0)

## 2011-11-11 NOTE — Progress Notes (Signed)
Subjective: [name] was seen on rounds today. He was lying quietly in bed in mild distress  continues to have pain with deep inspiration , right shoulder ,side and back rates pain 7/10. Decrease ROM in right arm/shoulder .Discussed orthopedist will be in tomorrow and evaluate with possible treatment of shoulder injection patient in agreement .   Objective: Vital signs in last 24 hours: Filed Vitals:   11/10/11 0517 11/10/11 1414 11/10/11 2055 11/11/11 0522  BP: 134/85 140/88 131/86 135/87  Pulse: 64 66 70 59  Temp: 97.7 F (36.5 C) 98.5 F (36.9 C) 98.7 F (37.1 C) 98.7 F (37.1 C)  TempSrc: Oral Oral Oral Oral  Resp: 18 18 18 18   Height:      Weight: 79.2 kg (174 lb 9.7 oz)   79.5 kg (175 lb 4.3 oz)  SpO2: 96% 98% 97% 94%   Weight change: 0.3 kg (10.6 oz)  Intake/Output Summary (Last 24 hours) at 11/11/11 0932 Last data filed at 11/11/11 0439  Gross per 24 hour  Intake 3035.42 ml  Output   2750 ml  Net 285.42 ml    Physical Exam: BP 135/87  Pulse 59  Temp 98.7 F (37.1 C) (Oral)  Resp 18  Ht 6\' 2"  (1.88 m)  Wt 79.5 kg (175 lb 4.3 oz)  BMI 22.50 kg/m2  SpO2 94% General Appearance: Alert and oriented, cooperative, well developed, well nourished, acute distress  Head: Normocephalic, atraumatic  Eyes: PERRLA, EOMI, scleral icterus  Nose: No sinus tenderness, no visible drainage, septum midline, normal mucosa  Throat: Lips, mucosa and tongue are normal, gums appear normal, dentition good condition  Neck: No adenopathy and supple, symmetrical, trachea midline  Back: Symmetric, no curvature, ROM normal, diffuse tenderness, bilateral CVA tenderness  Resp: CTA bilaterally, no wheezes/rales/rhonchi  Cardio: S1, S2 normal, no murmur/click/rub/gallop  GI: Soft, diffuse tenderness, slightly distended, bowel sounds present  Male Genitalia: Deferred, Denies  priapism Extremities: Normal, atraumatic, no cyanosis, Homans sign is negative,  no edema  Neurologic: CN II - XII  intact  Psych: Appropriate affect   Lab Results:  Basename 11/11/11 0427 11/10/11 1330  NA 138 135  K 3.8 3.6  CL 102 100  CO2 29 27  GLUCOSE 85 127*  BUN 7 8  CREATININE 0.79 0.74  CALCIUM 9.4 9.5  MG -- --  PHOS -- --    Basename 11/11/11 0427 11/10/11 1330  AST 22 24  ALT 9 10  ALKPHOS 80 83  BILITOT 2.4* 2.6*  PROT 7.5 7.8  ALBUMIN 3.9 4.1   No results found for this basename: LIPASE:2,AMYLASE:2 in the last 72 hours  Basename 11/11/11 0427 11/10/11 1330  WBC 10.6* 10.8*  NEUTROABS 6.0 6.2  HGB 10.1* 10.6*  HCT 27.6* 28.9*  MCV 89.3 88.1  PLT 267 253    Basename 11/08/11 1409  CKTOTAL 69  CKMB 0.8  CKMBINDEX --  TROPONINI <0.30   No components found with this basename: POCBNP:3 No results found for this basename: DDIMER:2 in the last 72 hours No results found for this basename: HGBA1C:2 in the last 72 hours No results found for this basename: CHOL:2,HDL:2,LDLCALC:2,TRIG:2,CHOLHDL:2,LDLDIRECT:2 in the last 72 hours No results found for this basename: TSH,T4TOTAL,FREET3,T3FREE,THYROIDAB in the last 72 hours  Basename 11/09/11 0452  VITAMINB12 --  FOLATE --  FERRITIN 758*  TIBC --  IRON --  RETICCTPCT 2.0    Micro Results: No results found for this or any previous visit (from the past 240 hour(s)).  Studies/Results: Dg Chest  2 View  11/10/2011  *RADIOLOGY REPORT*  Clinical Data: Right-sided chest pain and right shoulder pain. Sickle cell disease.  CHEST - 2 VIEW  Comparison: 11/07/2011  Findings: There is chronic accentuation of the interstitial markings.  Heart size and vascularity are normal.  Minimal scarring at the lung bases.  No acute abnormalities.  The bones are slightly sclerotic consistent with a history of sickle cell disease.  No effusions.  IMPRESSION: No acute abnormalities.  Original Report Authenticated By: Gwynn Burly, M.D.   Dg Shoulder Right  11/10/2011  *RADIOLOGY REPORT*  Clinical Data: Right shoulder pain.  RIGHT SHOULDER -  2+ VIEW  Comparison: Radiographs and a MRI dated 02/02/2006  Findings: There is sclerosis of the right humeral head and proximal neck consistent with multiple bone infarcts as demonstrated on the prior MRI scan.  No collapse of the humeral head.  Other osseous structures of the shoulder demonstrate no abnormalities.  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  Original Report Authenticated By: Gwynn Burly, M.D.    Medications: Scheduled Meds:    . enoxaparin (LOVENOX) injection  40 mg Subcutaneous Q24H  . folic acid  1 mg Oral Daily  . lactulose  20 g Oral BID  . nicotine  14 mg Transdermal Daily  . pantoprazole  40 mg Oral Q1200   Or  . pantoprazole (PROTONIX) IV  40 mg Intravenous Q1200   Continuous Infusions:    . dextrose 5 % and 0.45% NaCl 125 mL/hr at 11/10/11 1344   PRN Meds:.alum & mag hydroxide-simeth, diphenhydrAMINE, HYDROmorphone (DILAUDID) injection, oxyCODONE, promethazine, promethazine  Assessment/Plan: Principal Problem: Sickle cell anemia with crisis IVF , pain ,pruritis bowel management and DVT prophylaxis,  (reorder chest x-ray) Active Problems:  Tobacco abuse nicotine patch   Anemia secondary to SCD Right shoulder pain- xray completed reviewed with Dr. Dion Saucier he will see pt in AM and consider a shoulder injection  Constipation resolved good results with Lactulose   LOS: 4 days  Minor Iden P 11/11/2011, 9:32 AM

## 2011-11-11 NOTE — Progress Notes (Signed)
Physical Therapy Note  Order received. Chart reviewed. Noted MD has consulted with orthopedics to assess R shoulder. Will hold PT until ortho assessment. Otherwise, pt reports ambulating in hallway without physical assist or difficulty. Will check back on tomorrow afternoon to follow-up with pt for any further PT needs. Thanks.  Rebeca Alert, PT  [phone]

## 2011-11-11 NOTE — Progress Notes (Signed)
Returned for follow up with patient at bedside today.  Written consents for [facility] CM services obtained.  Will provide a transition of care call and comprehensive assessment upon discharge.  Traditionally he has gone directly the ED for acute sickle cell crisis.  Will attempt to establish more consistent contact with the [facility] and the primary care office services to redirect this pattern.  For any additional questions or new referrals please contact Anibal Henderson BSN RN [facility] Liaison at [phone].

## 2011-11-12 ENCOUNTER — Encounter (HOSPITAL_COMMUNITY): Payer: Self-pay | Admitting: *Deleted

## 2011-11-12 LAB — CBC WITH DIFFERENTIAL/PLATELET
Basophils Absolute: 0.1 10*3/uL (ref 0.0–0.1)
Basophils Relative: 1 % (ref 0–1)
Eosinophils Absolute: 1.5 10*3/uL — ABNORMAL HIGH (ref 0.0–0.7)
HCT: 25.8 % — ABNORMAL LOW (ref 39.0–52.0)
Hemoglobin: 9.3 g/dL — ABNORMAL LOW (ref 13.0–17.0)
Lymphocytes Relative: 24 % (ref 12–46)
Lymphs Abs: 2.4 10*3/uL (ref 0.7–4.0)
MCHC: 36 g/dL (ref 30.0–36.0)
MCV: 89.6 fL (ref 78.0–100.0)
Monocytes Relative: 12 % (ref 3–12)
Neutro Abs: 4.8 10*3/uL (ref 1.7–7.7)
RDW: 15.1 % (ref 11.5–15.5)

## 2011-11-12 LAB — COMPREHENSIVE METABOLIC PANEL
ALT: 10 U/L (ref 0–53)
Alkaline Phosphatase: 76 U/L (ref 39–117)
BUN: 6 mg/dL (ref 6–23)
CO2: 25 mEq/L (ref 19–32)
GFR calc Af Amer: >90 mL/min (ref >90)
GFR calc non Af Amer: >90 mL/min (ref >90)
Glucose, Bld: 137 mg/dL — ABNORMAL HIGH (ref 70–99)
Potassium: 3.5 mEq/L (ref 3.5–5.1)
Sodium: 135 mEq/L (ref 135–145)
Total Bilirubin: 2.3 mg/dL — ABNORMAL HIGH (ref 0.3–1.2)

## 2011-11-12 NOTE — Progress Notes (Signed)
PT Cancellation Note  Treatment cancelled today due to pt with cortisone shot in shoulder today and MD advised pt not to do much today. No further PT orders were written.  Pt will gradually progress ROM within his tolerance and follow up at ortho MD office Pt independent in mobility.  No PT at this time is needed  Bayard Hugger. Manson Passey, PT 11/12/2011, 3:45 PM

## 2011-11-12 NOTE — Consult Note (Signed)
ORTHOPAEDIC CONSULTATION  REQUESTING PHYSICIAN: Gwenyth Bender, MD  Chief Complaint: Right shoulder pain  HPI: [name] is a 36 y.o. male who complains of  right chronic shoulder pain as moderate to severe, worse with activity, better with rest. Worse with overhead motion. He has tried minimizing use, but continues to have moderate to severe pain. He has been seen before for his hip, but he now says that his hip is doing reasonably well. He is admitted for sickle cell crisis.  Past Medical History  Diagnosis Date  . Sickle cell crisis   . Blood transfusion   . Avascular necrosis of hip     bilateral  . Infection of bone, shoulder region     left shoulder  . Pneumonia   . Avascular necrosis of hip, left 08/27/2011   Past Surgical History  Procedure Date  . Orif right hip fracture 1995  . Joint replacement 2006    right total hip arthroplasty   History   Social History  . Marital Status: Single    Spouse Name: N/A    Number of Children: N/A  . Years of Education: N/A   Social History Main Topics  . Smoking status: Current Everyday Smoker -- 0.5 packs/day    Types: Cigarettes  . Smokeless tobacco: Never Used  . Alcohol Use: No  . Drug Use: No  . Sexually Active:    Other Topics Concern  . None   Social History Narrative  . None   Family History  Problem Relation Age of Onset  . Family history unknown: Yes   No Known Allergies Prior to Admission medications   Medication Sig Start Date End Date Taking? Authorizing Provider  diclofenac sodium (VOLTAREN) 1 % GEL Apply 1 application topically 4 (four) times daily. 08/27/11  Yes Keitha Butte, NP  folic acid (FOLVITE) 1 MG tablet Take 1 tablet (1 mg total) by mouth daily. 05/22/11 05/21/12 Yes Keitha Butte, NP  morphine (MS CONTIN) 60 MG 12 hr tablet Take 1 tablet (60 mg total) by mouth 2 (two) times daily. 05/22/11  Yes Keitha Butte, NP  morphine (MSIR) 30 MG tablet Take 1 tablet (30 mg total) by  mouth every 4 (four) hours as needed for pain. Breakthrough pain 08/27/11  Yes Keitha Butte, NP  promethazine (PHENERGAN) 25 MG tablet Take 1 tablet (25 mg total) by mouth every 6 (six) hours as needed. 08/27/11  Yes Keitha Butte, NP   Dg Chest 2 View  11/10/2011  *RADIOLOGY REPORT*  Clinical Data: Right-sided chest pain and right shoulder pain. Sickle cell disease.  CHEST - 2 VIEW  Comparison: 11/07/2011  Findings: There is chronic accentuation of the interstitial markings.  Heart size and vascularity are normal.  Minimal scarring at the lung bases.  No acute abnormalities.  The bones are slightly sclerotic consistent with a history of sickle cell disease.  No effusions.  IMPRESSION: No acute abnormalities.  Original Report Authenticated By: Gwynn Burly, M.D.   Dg Shoulder Right  11/10/2011  *RADIOLOGY REPORT*  Clinical Data: Right shoulder pain.  RIGHT SHOULDER - 2+ VIEW  Comparison: Radiographs and a MRI dated 02/02/2006  Findings: There is sclerosis of the right humeral head and proximal neck consistent with multiple bone infarcts as demonstrated on the prior MRI scan.  No collapse of the humeral head.  Other osseous structures of the shoulder demonstrate no abnormalities.  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  Original  Report Authenticated By: Gwynn Burly, M.D.    Positive ROS: All other systems have been reviewed and were otherwise negative with the exception of those mentioned in the HPI and as above.  Physical Exam: General: Alert, no acute distress Cardiovascular: No pedal edema Respiratory: No cyanosis, no use of accessory musculature GI: No organomegaly, abdomen is soft and non-tender Skin: No lesions in the area of chief complaint Neurologic: Sensation intact distally Psychiatric: Patient is competent for consent with normal mood and affect Lymphatic: No axillary or cervical lymphadenopathy  MUSCULOSKELETAL: Right shoulder active  forward flexion is 0-165. Assessment with reluctance, secondary to pain. External rotation is to 30. Rotator cuff strength is limited by pain.  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 Sickle cell disease.  Plan: This is somewhat of a chronic problem, which doesn't have a very good conservative solution. Nonetheless activity modification, periodic injections, and pain medications are option. He would like to begin with an intra-articular injection today, we will go ahead and do this. I will plan to see him in the next couple of weeks in the office, and depending on the severity of his shoulder pain, he may elect ultimately for hemiarthroplasty, although I would put this off as long as possible given his young age. He has already however had a total hip replacement, which seems to be doing fair. It is a metal-on-metal replacement, and I have referred him in the past of Dr. Turner Daniels for revision, however he has not actually followed through with this referral.  11/12/2011  9:53 AM  PATIENT:  [name]    PRE-PROCEDURE DIAGNOSIS:  Right shoulder avascular necrosis  POST-OPERATIVE DIAGNOSIS:  Same  PROCEDURE:  Intra-articular injection right shoulder  PROCEDURE DETAILS:  After informed verbal consent was obtained the posterior portal was prepped with alcohol and the right shoulder was injected into the glenohumeral joint with 4 cc of Xylocaine and 1 cc of Depo-Medrol was injected. He tolerated this well and a Band-Aid was placed.     Nesanel Aguila P, MD Cell [phone] Pager [phone]  11/12/2011 9:50 AM

## 2011-11-12 NOTE — Progress Notes (Signed)
Subjective: [name] was seen on rounds today. He was lying quietly in bed in mild distress  continues to have pain with deep inspiration , right shoulder ,side and back rates pain 8/10. Decrease ROM in right arm/shoulder Dr. Dion Saucier performed an injection tolerated it well patient does feel some progress was mad he is now able to left arm some what without aching sharp pain.  Objective: Vital signs in last 24 hours: Filed Vitals:   11/11/11 1510 11/11/11 2141 11/12/11 0500 11/12/11 0508  BP: 129/82 137/88  110/67  Pulse: 68 83  66  Temp: 98.1 F (36.7 C) 98.3 F (36.8 C)  98 F (36.7 C)  TempSrc: Oral Oral  Oral  Resp: 18 18  18   Height:      Weight:   79.5 kg (175 lb 4.3 oz)   SpO2: 94% 96%  96%   Weight change: 0 kg (0 lb)  Intake/Output Summary (Last 24 hours) at 11/12/11 1151 Last data filed at 11/12/11 0900  Gross per 24 hour  Intake   1860 ml  Output   2000 ml  Net   -140 ml    Physical Exam: BP 110/67  Pulse 66  Temp 98 F (36.7 C) (Oral)  Resp 18  Ht 6\' 2"  (1.88 m)  Wt 79.5 kg (175 lb 4.3 oz)  BMI 22.50 kg/m2  SpO2 96% General Appearance: Alert and oriented, cooperative, well developed, well nourished, acute distress  Head: Normocephalic, atraumatic  Eyes: PERRLA, EOMI, scleral icterus  Nose: No sinus tenderness, no visible drainage, septum midline, normal mucosa  Throat: Lips, mucosa and tongue are normal, gums appear normal, dentition good condition  Neck: No adenopathy and supple, symmetrical, trachea midline  Back: Symmetric, no curvature, ROM normal, diffuse tenderness, bilateral CVA tenderness  Resp:  no wheezes/rales rhonchi scattered right base  Cardio: S1, S2 normal, no murmur/click/rub/gallop  GI: Soft, diffuse tenderness, slightly distended, bowel sounds present  Male Genitalia: Deferred, Denies  priapism Extremities: Normal, atraumatic, no cyanosis, Homans sign is negative,  no edema  Neurologic: CN II - XII intact  Psych: Appropriate  affect   Lab Results:  Basename 11/12/11 0446 11/11/11 0427  NA 135 138  K 3.5 3.8  CL 102 102  CO2 25 29  GLUCOSE 137* 85  BUN 6 7  CREATININE 0.74 0.79  CALCIUM 8.8 9.4  MG -- --  PHOS -- --    Basename 11/12/11 0446 11/11/11 0427  AST 22 22  ALT 10 9  ALKPHOS 76 80  BILITOT 2.3* 2.4*  PROT 6.9 7.5  ALBUMIN 3.7 3.9   No results found for this basename: LIPASE:2,AMYLASE:2 in the last 72 hours  Basename 11/12/11 0446 11/11/11 0427  WBC 10.0 10.6*  NEUTROABS 4.8 6.0  HGB 9.3* 10.1*  HCT 25.8* 27.6*  MCV 89.6 89.3  PLT 273 267   No results found for this basename: CKTOTAL:3,CKMB:3,CKMBINDEX:3,TROPONINI:3 in the last 72 hours No components found with this basename: POCBNP:3 No results found for this basename: DDIMER:2 in the last 72 hours No results found for this basename: HGBA1C:2 in the last 72 hours No results found for this basename: CHOL:2,HDL:2,LDLCALC:2,TRIG:2,CHOLHDL:2,LDLDIRECT:2 in the last 72 hours No results found for this basename: TSH,T4TOTAL,FREET3,T3FREE,THYROIDAB in the last 72 hours No results found for this basename: VITAMINB12:2,FOLATE:2,FERRITIN:2,TIBC:2,IRON:2,RETICCTPCT:2 in the last 72 hours  Micro Results: No results found for this or any previous visit (from the past 240 hour(s)).  Studies/Results: Dg Chest 2 View  11/10/2011  *RADIOLOGY REPORT*  Clinical Data:  Right-sided chest pain and right shoulder pain. Sickle cell disease.  CHEST - 2 VIEW  Comparison: 11/07/2011  Findings: There is chronic accentuation of the interstitial markings.  Heart size and vascularity are normal.  Minimal scarring at the lung bases.  No acute abnormalities.  The bones are slightly sclerotic consistent with a history of sickle cell disease.  No effusions.  IMPRESSION: No acute abnormalities.  Original Report Authenticated By: Gwynn Burly, M.D.   Dg Shoulder Right  11/10/2011  *RADIOLOGY REPORT*  Clinical Data: Right shoulder pain.  RIGHT SHOULDER - 2+ VIEW   Comparison: Radiographs and a MRI dated 02/02/2006  Findings: There is sclerosis of the right humeral head and proximal neck consistent with multiple bone infarcts as demonstrated on the prior MRI scan.  No collapse of the humeral head.  Other osseous structures of the shoulder demonstrate no abnormalities.  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  Original Report Authenticated By: Gwynn Burly, M.D.    Medications: Scheduled Meds:    . enoxaparin (LOVENOX) injection  40 mg Subcutaneous Q24H  . folic acid  1 mg Oral Daily  . lactulose  20 g Oral BID  . nicotine  14 mg Transdermal Daily  . pantoprazole  40 mg Oral Q1200   Or  . pantoprazole (PROTONIX) IV  40 mg Intravenous Q1200   Continuous Infusions:    . dextrose 5 % and 0.45% NaCl 125 mL/hr at 11/12/11 1130   PRN Meds:.alum & mag hydroxide-simeth, diphenhydrAMINE, HYDROmorphone (DILAUDID) injection, oxyCODONE, promethazine, promethazine  Assessment/Plan: Principal Problem: Sickle cell anemia with crisis IVF , pain ,pruritis bowel management and DVT prophylaxis, Active Problems:  Tobacco abuse nicotine patch   Anemia secondary to SCD Right shoulder pain- Dr. Dion Saucier saw pt today and performed  a shoulder injection  Constipation resolved good results with Lactulose continues   LOS: 5 days  [name] 11/12/2011, 11:51 AM

## 2011-11-13 LAB — CBC WITH DIFFERENTIAL/PLATELET
Basophils Absolute: 0.1 10*3/uL (ref 0.0–0.1)
Eosinophils Absolute: 1.4 10*3/uL — ABNORMAL HIGH (ref 0.0–0.7)
HCT: 26.3 % — ABNORMAL LOW (ref 39.0–52.0)
Lymphocytes Relative: 21 % (ref 12–46)
Lymphs Abs: 2.4 10*3/uL (ref 0.7–4.0)
MCHC: 36.9 g/dL — ABNORMAL HIGH (ref 30.0–36.0)
MCV: 88.6 fL (ref 78.0–100.0)
Monocytes Relative: 10 % (ref 3–12)
Neutro Abs: 6.4 10*3/uL (ref 1.7–7.7)
Platelets: 272 10*3/uL (ref 150–400)
RDW: 15.1 % (ref 11.5–15.5)
WBC: 11.4 10*3/uL — ABNORMAL HIGH (ref 4.0–10.5)

## 2011-11-13 LAB — COMPREHENSIVE METABOLIC PANEL
CO2: 26 mEq/L (ref 19–32)
Calcium: 9.1 mg/dL (ref 8.4–10.5)
Creatinine, Ser: 0.74 mg/dL (ref 0.50–1.35)
GFR calc Af Amer: >90 mL/min (ref >90)
GFR calc non Af Amer: >90 mL/min (ref >90)
Glucose, Bld: 106 mg/dL — ABNORMAL HIGH (ref 70–99)
Total Protein: 7.3 g/dL (ref 6.0–8.3)

## 2011-11-13 MED ORDER — HYDROMORPHONE HCL PF 4 MG/ML IJ SOLN
2.0000 mg | INTRAMUSCULAR | Status: DC | PRN
  Administered 2011-11-13 – 2011-11-20 (×59): 4 mg via INTRAVENOUS
  Filled 2011-11-13 (×61): qty 1

## 2011-11-13 MED ORDER — KETOROLAC TROMETHAMINE 15 MG/ML IJ SOLN
15.0000 mg | Freq: Four times a day (QID) | INTRAMUSCULAR | Status: DC | PRN
  Administered 2011-11-13 – 2011-11-16 (×4): 15 mg via INTRAVENOUS
  Filled 2011-11-13 (×5): qty 1

## 2011-11-13 NOTE — Progress Notes (Signed)
Subjective: [name] was seen on rounds today. He was up oob sitting on lounge area in his room on the phone in no acute distress He continues to have pain with deep inspiration chest x-ray completed  , right shoulder ,side and back rates pain 9/10. Decrease ROM in right arm/shoulder injection hope for improvement on tomorrow.  Objective: Vital signs in last 24 hours: Filed Vitals:   11/12/11 1444 11/12/11 2130 11/13/11 0500 11/13/11 0535  BP: 139/84 126/79  132/83  Pulse: 77 71  57  Temp: 98.6 F (37 C) 98.5 F (36.9 C)  98 F (36.7 C)  TempSrc: Oral Oral  Oral  Resp: 18 16  18   Height:      Weight:   79.6 kg (175 lb 7.8 oz)   SpO2: 95% 94%  96%   Weight change: 0.1 kg (3.5 oz)  Intake/Output Summary (Last 24 hours) at 11/13/11 1316 Last data filed at 11/13/11 0954  Gross per 24 hour  Intake   3345 ml  Output   6125 ml  Net  -2780 ml    Physical Exam: BP 132/83  Pulse 57  Temp 98 F (36.7 C) (Oral)  Resp 18  Ht 6\' 2"  (1.88 m)  Wt 79.6 kg (175 lb 7.8 oz)  BMI 22.53 kg/m2  SpO2 96% General Appearance: Alert and oriented, cooperative, well developed, well nourished, acute distress  Head: Normocephalic, atraumatic  Eyes: PERRLA, EOMI, scleral icterus  Nose: No sinus tenderness, no visible drainage, septum midline, normal mucosa  Throat: Lips, mucosa and tongue are normal, gums appear normal, dentition good condition  Neck: No adenopathy and supple, symmetrical, trachea midline  Back: Symmetric, no curvature, ROM normal, diffuse tenderness, bilateral CVA tenderness  Resp:  no wheezes/rales rhonchi scattered right base  Cardio: S1, S2 normal, no murmur/click/rub/gallop  GI: Soft, diffuse tenderness, slightly distended, bowel sounds present  Male Genitalia: Deferred, Denies  priapism Extremities: Normal, atraumatic, no cyanosis, Homans sign is negative,  no edema right shoulder hurts more today  Neurologic: CN II - XII intact  Psych: Appropriate affect   Lab  Results:  Basename 11/13/11 0430 11/12/11 0446  NA 136 135  K 3.7 3.5  CL 102 102  CO2 26 25  GLUCOSE 106* 137*  BUN 7 6  CREATININE 0.74 0.74  CALCIUM 9.1 8.8  MG -- --  PHOS -- --    Basename 11/13/11 0430 11/12/11 0446  AST 22 22  ALT 10 10  ALKPHOS 77 76  BILITOT 2.4* 2.3*  PROT 7.3 6.9  ALBUMIN 3.8 3.7   No results found for this basename: LIPASE:2,AMYLASE:2 in the last 72 hours  Basename 11/13/11 0430 11/12/11 0446  WBC 11.4* 10.0  NEUTROABS 6.4 4.8  HGB 9.7* 9.3*  HCT 26.3* 25.8*  MCV 88.6 89.6  PLT 272 273   No results found for this basename: CKTOTAL:3,CKMB:3,CKMBINDEX:3,TROPONINI:3 in the last 72 hours No components found with this basename: POCBNP:3 No results found for this basename: DDIMER:2 in the last 72 hours No results found for this basename: HGBA1C:2 in the last 72 hours No results found for this basename: CHOL:2,HDL:2,LDLCALC:2,TRIG:2,CHOLHDL:2,LDLDIRECT:2 in the last 72 hours No results found for this basename: TSH,T4TOTAL,FREET3,T3FREE,THYROIDAB in the last 72 hours No results found for this basename: VITAMINB12:2,FOLATE:2,FERRITIN:2,TIBC:2,IRON:2,RETICCTPCT:2 in the last 72 hours  Micro Results: No results found for this or any previous visit (from the past 240 hour(s)).  Studies/Results: No results found.  Medications: Scheduled Meds:    . enoxaparin (LOVENOX) injection  40 mg  Subcutaneous Q24H  . folic acid  1 mg Oral Daily  . lactulose  20 g Oral BID  . nicotine  14 mg Transdermal Daily  . pantoprazole  40 mg Oral Q1200   Or  . pantoprazole (PROTONIX) IV  40 mg Intravenous Q1200   Continuous Infusions:    . dextrose 5 % and 0.45% NaCl 125 mL/hr (11/13/11 0954)   PRN Meds:.alum & mag hydroxide-simeth, diphenhydrAMINE, HYDROmorphone (DILAUDID) injection, oxyCODONE, promethazine, promethazine  Assessment/Plan: Principal Problem: Sickle cell anemia with crisis IVF , pain ,pruritis bowel management and DVT prophylaxis   Tobacco  abuse nicotine patch   Anemia secondary to SCD Right shoulder pain- s/p shoulder injection feel worst today (PT signed off) Constipation resolved    LOS: 6 days  [name] P 11/13/2011, 1:16 PM

## 2011-11-14 LAB — CBC WITH DIFFERENTIAL/PLATELET
Basophils Relative: 1 % (ref 0–1)
HCT: 24.7 % — ABNORMAL LOW (ref 39.0–52.0)
Hemoglobin: 9.1 g/dL — ABNORMAL LOW (ref 13.0–17.0)
Lymphocytes Relative: 24 % (ref 12–46)
Lymphs Abs: 2.9 10*3/uL (ref 0.7–4.0)
MCHC: 36.8 g/dL — ABNORMAL HIGH (ref 30.0–36.0)
Monocytes Relative: 12 % (ref 3–12)
Neutro Abs: 6 10*3/uL (ref 1.7–7.7)
Neutrophils Relative %: 50 % (ref 43–77)
RBC: 2.8 MIL/uL — ABNORMAL LOW (ref 4.22–5.81)
WBC: 11.9 10*3/uL — ABNORMAL HIGH (ref 4.0–10.5)

## 2011-11-14 LAB — COMPREHENSIVE METABOLIC PANEL
Albumin: 3.8 g/dL (ref 3.5–5.2)
Alkaline Phosphatase: 77 U/L (ref 39–117)
BUN: 7 mg/dL (ref 6–23)
CO2: 27 mEq/L (ref 19–32)
Chloride: 101 mEq/L (ref 96–112)
GFR calc non Af Amer: >90 mL/min (ref >90)
Potassium: 3.8 mEq/L (ref 3.5–5.1)
Total Bilirubin: 2.7 mg/dL — ABNORMAL HIGH (ref 0.3–1.2)

## 2011-11-14 LAB — PREPARE RBC (CROSSMATCH)

## 2011-11-14 MED ORDER — SODIUM CHLORIDE 0.9 % IJ SOLN
10.0000 mL | INTRAMUSCULAR | Status: DC | PRN
  Administered 2011-11-14 – 2011-11-19 (×6): 10 mL

## 2011-11-14 NOTE — Progress Notes (Signed)
Subjective:  Patient reports ongoing pain in his shoulders and chest. He rates his pain as 8/10. There's been no improvement from yesterday. Denies significant cough or shortness of breath. Pain medication continues to help however. Patient received the intra-articular steroid injections. He notes however that his shoulder pain did not improve.   No Known Allergies Current Facility-Administered Medications  Medication Dose Route Frequency Provider Last Rate Last Dose  . alum & mag hydroxide-simeth (MAALOX/MYLANTA) 200-200-20 MG/5ML suspension 15-30 mL  15-30 mL Oral Q4H PRN Ron Parker, MD      . dextrose 5 %-0.45 % sodium chloride infusion   Intravenous Continuous Ron Parker, MD 125 mL/hr at 11/14/11 1210 125 mL/hr at 11/14/11 1210  . diphenhydrAMINE (BENADRYL) injection 25 mg  25 mg Intravenous Q6H PRN Ron Parker, MD   25 mg at 11/14/11 0913  . enoxaparin (LOVENOX) injection 40 mg  40 mg Subcutaneous Q24H Ron Parker, MD   40 mg at 11/14/11 0912  . folic acid (FOLVITE) tablet 1 mg  1 mg Oral Daily Ron Parker, MD   1 mg at 11/14/11 0912  . HYDROmorphone (DILAUDID) injection 2-4 mg  2-4 mg Intravenous Q2H PRN Grayce Sessions, NP   4 mg at 11/14/11 1208  . ketorolac (TORADOL) 15 MG/ML injection 15 mg  15 mg Intravenous Q6H PRN Grayce Sessions, NP   15 mg at 11/13/11 1811  . lactulose (CHRONULAC) 10 GM/15ML solution 20 g  20 g Oral BID Grayce Sessions, NP   20 g at 11/14/11 0912  . nicotine (NICODERM CQ - dosed in mg/24 hours) patch 14 mg  14 mg Transdermal Daily Jinger Neighbors, NP   14 mg at 11/14/11 0912  . oxyCODONE (Oxy IR/ROXICODONE) immediate release tablet 10 mg  10 mg Oral Q6H PRN Ron Parker, MD   10 mg at 11/13/11 1143  . pantoprazole (PROTONIX) EC tablet 40 mg  40 mg Oral Q1200 Ron Parker, MD   40 mg at 11/14/11 1208   Or  . pantoprazole (PROTONIX) injection 40 mg  40 mg Intravenous Q1200 Ron Parker, MD      .  promethazine (PHENERGAN) tablet 12.5-25 mg  12.5-25 mg Oral Q4H PRN Ron Parker, MD   25 mg at 11/14/11 9562   Or  . promethazine (PHENERGAN) suppository 12.5-25 mg  12.5-25 mg Rectal Q4H PRN Ron Parker, MD        Objective: Blood pressure 120/71, pulse 61, temperature 98.2 F (36.8 C), temperature source Oral, resp. rate 20, height 6\' 2"  (1.88 m), weight 178 lb 2.1 oz (80.8 kg), SpO2 94.00%.  Well-developed slender black male in no acute distress. HEENT: No sinus tenderness. Mild scleral icterus. NECK: No enlarged thyroid. NODES: No adenopathy. LUNGS: Basilar crackles bilaterally. No wheezes. No vocal fremitus. CV: Normal S1, S2 without S3. ABD: Soft, nontender. MSK: Negative Homans. No edema. Tenderness in right a.c. joint greater than left a.c. joint. NEURO: Intact. SKIN: Patient with IV in the right hand.  Lab results: Results for orders placed during the hospital encounter of 11/07/11 (from the past 48 hour(s))  CBC WITH DIFFERENTIAL     Status: Abnormal   Collection Time   11/13/11  4:30 AM      Component Value Range Comment   WBC 11.4 (*) 4.0 - 10.5 K/uL WHITE COUNT CONFIRMED ON SMEAR   RBC 2.97 (*) 4.22 - 5.81 MIL/uL    Hemoglobin 9.7 (*) 13.0 - 17.0  g/dL    HCT 66.4 (*) 40.3 - 52.0 %    MCV 88.6  78.0 - 100.0 fL    MCH 32.7  26.0 - 34.0 pg    MCHC 36.9 (*) 30.0 - 36.0 g/dL    RDW 47.4  25.9 - 56.3 %    Platelets 272  150 - 400 K/uL    Neutrophils Relative 56  43 - 77 %    Lymphocytes Relative 21  12 - 46 %    Monocytes Relative 10  3 - 12 %    Eosinophils Relative 12 (*) 0 - 5 %    Basophils Relative 1  0 - 1 %    Neutro Abs 6.4  1.7 - 7.7 K/uL    Lymphs Abs 2.4  0.7 - 4.0 K/uL    Monocytes Absolute 1.1 (*) 0.1 - 1.0 K/uL    Eosinophils Absolute 1.4 (*) 0.0 - 0.7 K/uL    Basophils Absolute 0.1  0.0 - 0.1 K/uL    RBC Morphology TARGET CELLS   SICKLE CELLS   WBC Morphology WHITE COUNT CONFIRMED ON SMEAR      Smear Review PLATELET COUNT CONFIRMED BY  SMEAR   LARGE PLATELETS PRESENT  COMPREHENSIVE METABOLIC PANEL     Status: Abnormal   Collection Time   11/13/11  4:30 AM      Component Value Range Comment   Sodium 136  135 - 145 mEq/L    Potassium 3.7  3.5 - 5.1 mEq/L    Chloride 102  96 - 112 mEq/L    CO2 26  19 - 32 mEq/L    Glucose, Bld 106 (*) 70 - 99 mg/dL    BUN 7  6 - 23 mg/dL    Creatinine, Ser 8.75  0.50 - 1.35 mg/dL    Calcium 9.1  8.4 - 64.3 mg/dL    Total Protein 7.3  6.0 - 8.3 g/dL    Albumin 3.8  3.5 - 5.2 g/dL    AST 22  0 - 37 U/L    ALT 10  0 - 53 U/L    Alkaline Phosphatase 77  39 - 117 U/L    Total Bilirubin 2.4 (*) 0.3 - 1.2 mg/dL    GFR calc non Af Amer >90  >90 mL/min    GFR calc Af Amer >90  >90 mL/min   CBC WITH DIFFERENTIAL     Status: Abnormal   Collection Time   11/14/11  5:57 AM      Component Value Range Comment   WBC 11.9 (*) 4.0 - 10.5 K/uL    RBC 2.80 (*) 4.22 - 5.81 MIL/uL    Hemoglobin 9.1 (*) 13.0 - 17.0 g/dL    HCT 32.9 (*) 51.8 - 52.0 %    MCV 88.2  78.0 - 100.0 fL    MCH 32.5  26.0 - 34.0 pg    MCHC 36.8 (*) 30.0 - 36.0 g/dL    RDW 84.1  66.0 - 63.0 %    Platelets 261  150 - 400 K/uL    Neutrophils Relative 50  43 - 77 %    Neutro Abs 6.0  1.7 - 7.7 K/uL    Lymphocytes Relative 24  12 - 46 %    Lymphs Abs 2.9  0.7 - 4.0 K/uL    Monocytes Relative 12  3 - 12 %    Monocytes Absolute 1.5 (*) 0.1 - 1.0 K/uL    Eosinophils Relative 12 (*) 0 - 5 %  Eosinophils Absolute 1.5 (*) 0.0 - 0.7 K/uL    Basophils Relative 1  0 - 1 %    Basophils Absolute 0.1  0.0 - 0.1 K/uL   COMPREHENSIVE METABOLIC PANEL     Status: Abnormal   Collection Time   11/14/11  5:57 AM      Component Value Range Comment   Sodium 136  135 - 145 mEq/L    Potassium 3.8  3.5 - 5.1 mEq/L    Chloride 101  96 - 112 mEq/L    CO2 27  19 - 32 mEq/L    Glucose, Bld 89  70 - 99 mg/dL    BUN 7  6 - 23 mg/dL    Creatinine, Ser 6.96  0.50 - 1.35 mg/dL    Calcium 9.0  8.4 - 29.5 mg/dL    Total Protein 7.2  6.0 - 8.3 g/dL     Albumin 3.8  3.5 - 5.2 g/dL    AST 22  0 - 37 U/L    ALT 10  0 - 53 U/L    Alkaline Phosphatase 77  39 - 117 U/L    Total Bilirubin 2.7 (*) 0.3 - 1.2 mg/dL    GFR calc non Af Amer >90  >90 mL/min    GFR calc Af Amer >90  >90 mL/min     Studies/Results: No results found.  Patient Active Problem List  Diagnosis  . Sickle cell anemia with crisis  . Constipation  . Acute chest syndrome  . Tobacco abuse  . Elevated brain natriuretic peptide (BNP) level  . Avascular necrosis of hip, left  . Anemia    Impression: Sickle cell crisis. This continues with slow resolution of her active hemolysis. Avascular necrosis of the shoulders and hips. Tobacco abuse history. He's been encouraged to discontinue.   colonic dysfunction.    Plan: Decrease his IV fluids to 50 cc an hour. PICC line placement. Exchange transfusion when feasible. Followup CBC, CMET in a.m.   August Saucer, Zakary Kimura 11/14/2011 1:59 PM

## 2011-11-14 NOTE — Progress Notes (Signed)
Peripherally Inserted Central Catheter/Midline Placement  The IV Nurse has discussed with the patient and/or persons authorized to consent for the patient, the purpose of this procedure and the potential benefits and risks involved with this procedure.  The benefits include less needle sticks, lab draws from the catheter and patient may be discharged home with the catheter.  Risks include, but not limited to, infection, bleeding, blood clot (thrombus formation), and puncture of an artery; nerve damage and irregular heat beat.  Alternatives to this procedure were also discussed.  PICC/Midline Placement Documentation        [name] 11/14/2011, 5:36 PM

## 2011-11-15 LAB — COMPREHENSIVE METABOLIC PANEL
ALT: 13 U/L (ref 0–53)
AST: 23 U/L (ref 0–37)
CO2: 27 mEq/L (ref 19–32)
Calcium: 8.9 mg/dL (ref 8.4–10.5)
Chloride: 103 mEq/L (ref 96–112)
GFR calc non Af Amer: >90 mL/min (ref >90)
Sodium: 137 mEq/L (ref 135–145)
Total Bilirubin: 2.5 mg/dL — ABNORMAL HIGH (ref 0.3–1.2)

## 2011-11-15 LAB — CBC WITH DIFFERENTIAL/PLATELET
Basophils Relative: 1 % (ref 0–1)
HCT: 26.5 % — ABNORMAL LOW (ref 39.0–52.0)
Hemoglobin: 9.4 g/dL — ABNORMAL LOW (ref 13.0–17.0)
Lymphocytes Relative: 24 % (ref 12–46)
Monocytes Relative: 13 % — ABNORMAL HIGH (ref 3–12)
Neutro Abs: 6.9 10*3/uL (ref 1.7–7.7)
WBC: 13.7 10*3/uL — ABNORMAL HIGH (ref 4.0–10.5)

## 2011-11-15 MED ORDER — HYDROMORPHONE HCL PF 2 MG/ML IJ SOLN
INTRAMUSCULAR | Status: AC
  Administered 2011-11-15: 20:00:00
  Filled 2011-11-15: qty 2

## 2011-11-15 MED ORDER — HYDROMORPHONE HCL PF 2 MG/ML IJ SOLN
INTRAMUSCULAR | Status: AC
  Administered 2011-11-15: 22:00:00
  Filled 2011-11-15: qty 2

## 2011-11-16 LAB — COMPREHENSIVE METABOLIC PANEL
ALT: 14 U/L (ref 0–53)
BUN: 9 mg/dL (ref 6–23)
Calcium: 8.7 mg/dL (ref 8.4–10.5)
Creatinine, Ser: 0.74 mg/dL (ref 0.50–1.35)
GFR calc Af Amer: >90 mL/min (ref >90)
Glucose, Bld: 86 mg/dL (ref 70–99)
Sodium: 138 mEq/L (ref 135–145)
Total Protein: 7.1 g/dL (ref 6.0–8.3)

## 2011-11-16 LAB — CBC WITH DIFFERENTIAL/PLATELET
Eosinophils Absolute: 1.5 10*3/uL — ABNORMAL HIGH (ref 0.0–0.7)
Eosinophils Relative: 12 % — ABNORMAL HIGH (ref 0–5)
Lymphs Abs: 3.1 10*3/uL (ref 0.7–4.0)
MCH: 31.3 pg (ref 26.0–34.0)
MCV: 88.1 fL (ref 78.0–100.0)
Monocytes Absolute: 1.5 10*3/uL — ABNORMAL HIGH (ref 0.1–1.0)
Platelets: 243 10*3/uL (ref 150–400)
RBC: 3.1 MIL/uL — ABNORMAL LOW (ref 4.22–5.81)

## 2011-11-16 LAB — TYPE AND SCREEN
ABO/RH(D): O POS
Unit division: 0
Unit division: 0

## 2011-11-16 MED ORDER — HYDROMORPHONE HCL PF 2 MG/ML IJ SOLN
INTRAMUSCULAR | Status: AC
  Administered 2011-11-16: 01:00:00
  Filled 2011-11-16: qty 2

## 2011-11-16 NOTE — Progress Notes (Signed)
Subjective: [name] was seen on rounds today. He was lying in bed in no acute distress . States breathing is better less pain but increase pain in shoulder they are swapping places. Rates pain 9/10. Decrease ROM in right arm/shoulder.   Objective: Vital signs in last 24 hours: Filed Vitals:   11/15/11 1520 11/16/11 0518 11/16/11 1012 11/16/11 1405  BP: 135/86 147/89 134/81 144/92  Pulse: 82 54 74 69  Temp: 98.2 F (36.8 C) 98.2 F (36.8 C) 97.8 F (36.6 C) 98.3 F (36.8 C)  TempSrc: Oral Oral Oral Oral  Resp: 20 19 20 18   Height:      Weight:      SpO2: 95% 93% 96% 93%   Weight change:   Intake/Output Summary (Last 24 hours) at 11/16/11 1626 Last data filed at 11/16/11 1600  Gross per 24 hour  Intake    480 ml  Output   5975 ml  Net  -5495 ml    Physical Exam: BP 144/92  Pulse 69  Temp 98.3 F (36.8 C) (Oral)  Resp 18  Ht 6\' 2"  (1.88 m)  Wt 81.466 kg (179 lb 9.6 oz)  BMI 23.06 kg/m2  SpO2 93% General Appearance: Alert and oriented, cooperative, well developed, well nourished, acute distress  Head: Normocephalic, atraumatic  Eyes: PERRLA, EOMI, scleral icterus  Nose: No sinus tenderness, no visible drainage, septum midline, normal mucosa  Throat: Lips, mucosa and tongue are normal, gums appear normal, dentition good condition  Neck: No adenopathy and supple, symmetrical, trachea midline  Back: Symmetric, no curvature, ROM normal, diffuse tenderness, bilateral CVA tenderness  Resp:  no wheezes/rales rhonchi scattered right base  Cardio: S1, S2 normal, no murmur/click/rub/gallop  GI: Soft, slightly distended, bowel sounds present  Male Genitalia: Deferred, Denies  priapism Extremities: Normal, atraumatic, no cyanosis, Homans sign is negative,  no edema right shoulder hurts  Neurologic: CN II - XII intact  Psych: Appropriate affect  Lab Results:  Basename 11/16/11 0630 11/15/11 0458  NA 138 137  K 3.8 3.8  CL 102 103  CO2 27 27  GLUCOSE 86 102*  BUN 9 11    CREATININE 0.74 0.75  CALCIUM 8.7 8.9  MG -- --  PHOS -- --    Basename 11/16/11 0630 11/15/11 0458  AST 26 23  ALT 14 13  ALKPHOS 78 79  BILITOT 2.9* 2.5*  PROT 7.1 7.2  ALBUMIN 3.7 3.8   No results found for this basename: LIPASE:2,AMYLASE:2 in the last 72 hours  Basename 11/16/11 0630 11/15/11 0458  WBC 12.5* 13.7*  NEUTROABS 6.4 6.9  HGB 9.7* 9.4*  HCT 27.3* 26.5*  MCV 88.1 88.9  PLT 243 273   No results found for this basename: CKTOTAL:3,CKMB:3,CKMBINDEX:3,TROPONINI:3 in the last 72 hours No components found with this basename: POCBNP:3 No results found for this basename: DDIMER:2 in the last 72 hours No results found for this basename: HGBA1C:2 in the last 72 hours No results found for this basename: CHOL:2,HDL:2,LDLCALC:2,TRIG:2,CHOLHDL:2,LDLDIRECT:2 in the last 72 hours No results found for this basename: TSH,T4TOTAL,FREET3,T3FREE,THYROIDAB in the last 72 hours No results found for this basename: VITAMINB12:2,FOLATE:2,FERRITIN:2,TIBC:2,IRON:2,RETICCTPCT:2 in the last 72 hours  Micro Results: No results found for this or any previous visit (from the past 240 hour(s)).  Studies/Results: No results found.  Medications: Scheduled Meds:    . enoxaparin (LOVENOX) injection  40 mg Subcutaneous Q24H  . folic acid  1 mg Oral Daily  . HYDROmorphone      . HYDROmorphone      .  HYDROmorphone      . lactulose  20 g Oral BID  . nicotine  14 mg Transdermal Daily  . pantoprazole  40 mg Oral Q1200   Or  . pantoprazole (PROTONIX) IV  40 mg Intravenous Q1200   Continuous Infusions:    . dextrose 5 % and 0.45% NaCl 125 mL/hr at 11/16/11 0200   PRN Meds:.alum & mag hydroxide-simeth, diphenhydrAMINE, HYDROmorphone (DILAUDID) injection, ketorolac, oxyCODONE, promethazine, promethazine, sodium chloride  Assessment/Plan: Principal Problem:  Sickle cell anemia with crisis :  Patient [place] in active hemolysis, IVF, pain ,pruritis, bowel management and DVT prophylaxis  (recently had exchanges and blood transfusion)  Tobacco abuse: nicotine patch   Anemia: secondary to SCD Right shoulder pain: s/p shoulder injection feels no  Worst or better  Constipation : resolved  Questionable HTN monitor   LOS: 9 days  EDWARDS, MICHELLE P 11/16/2011, 4:26 PM

## 2011-11-16 NOTE — Progress Notes (Signed)
Subjective:  Patient was complaining of increasing left-sided chest pain. Pain is slightly increased from yesterday. Denies any cough. No increased shortness of breath. Denies significant limb pain otherwise. Patient underwent exchange transfusion which he tolerated well. No other new complaints.   No Known Allergies Current Facility-Administered Medications  Medication Dose Route Frequency Provider Last Rate Last Dose  . alum & mag hydroxide-simeth (MAALOX/MYLANTA) 200-200-20 MG/5ML suspension 15-30 mL  15-30 mL Oral Q4H PRN Ron Parker, MD      . dextrose 5 %-0.45 % sodium chloride infusion   Intravenous Continuous Ron Parker, MD 125 mL/hr at 11/16/11 0200    . diphenhydrAMINE (BENADRYL) injection 25 mg  25 mg Intravenous Q6H PRN Ron Parker, MD   25 mg at 11/16/11 0424  . enoxaparin (LOVENOX) injection 40 mg  40 mg Subcutaneous Q24H Ron Parker, MD   40 mg at 11/15/11 1007  . folic acid (FOLVITE) tablet 1 mg  1 mg Oral Daily Ron Parker, MD   1 mg at 11/15/11 1007  . HYDROmorphone (DILAUDID) 2 MG/ML injection           . HYDROmorphone (DILAUDID) 2 MG/ML injection           . HYDROmorphone (DILAUDID) 2 MG/ML injection           . HYDROmorphone (DILAUDID) injection 2-4 mg  2-4 mg Intravenous Q2H PRN Grayce Sessions, NP   4 mg at 11/16/11 0829  . ketorolac (TORADOL) 15 MG/ML injection 15 mg  15 mg Intravenous Q6H PRN Grayce Sessions, NP   15 mg at 11/14/11 1632  . lactulose (CHRONULAC) 10 GM/15ML solution 20 g  20 g Oral BID Grayce Sessions, NP   20 g at 11/15/11 2105  . nicotine (NICODERM CQ - dosed in mg/24 hours) patch 14 mg  14 mg Transdermal Daily Jinger Neighbors, NP   14 mg at 11/15/11 1008  . oxyCODONE (Oxy IR/ROXICODONE) immediate release tablet 10 mg  10 mg Oral Q6H PRN Ron Parker, MD   10 mg at 11/13/11 1143  . pantoprazole (PROTONIX) EC tablet 40 mg  40 mg Oral Q1200 Ron Parker, MD   40 mg at 11/15/11 1220   Or  .  pantoprazole (PROTONIX) injection 40 mg  40 mg Intravenous Q1200 Ron Parker, MD      . promethazine (PHENERGAN) tablet 12.5-25 mg  12.5-25 mg Oral Q4H PRN Ron Parker, MD   25 mg at 11/16/11 1191   Or  . promethazine (PHENERGAN) suppository 12.5-25 mg  12.5-25 mg Rectal Q4H PRN Ron Parker, MD      . sodium chloride 0.9 % injection 10-40 mL  10-40 mL Intracatheter PRN Gwenyth Bender, MD   10 mL at 11/14/11 2139    Objective: Blood pressure 147/89, pulse 54, temperature 98.2 F (36.8 C), temperature source Oral, resp. rate 19, height 6\' 2"  (1.88 m), weight 179 lb 9.6 oz (81.466 kg), SpO2 93.00%.  Well-developed large black male in mild distress. HEENT: No sinus tenderness. Mild sclera icterus. NECK: No enlarged thyroid. No posterior cervical nodes. LUNGS: Clear to auscultation. He sounds at left base. No vocal fremitus. Minimal tenderness to percussion. CV: Normal S1, S2 without S3. ABD: Soft, nontender. MSK: Negative Homans. No edema. NEURO: Intact.  Lab results: Hemoglobin 9.4. Total bilirubin 2.5.   Studies/Results: No results found.  Patient Active Problem List  Diagnosis  . Sickle cell anemia with crisis  . Constipation  .  Acute chest syndrome  . Tobacco abuse  . Elevated brain natriuretic peptide (BNP) level  . Avascular necrosis of hip, left  . Anemia     Impression: Sickle cell crisis. Early acute chest syndrome. History of tobacco abuse. 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  Elevated pro BNP.    Plan: Continue supportive therapy.  Continue IV fluids and empiric antibiotics.  Followup CBC, CMET in a.m.  Evaluate for repeat exchange transfusions.  Rubina Basinski 11/15/2011, 08:30 PM

## 2011-11-17 LAB — CBC WITH DIFFERENTIAL/PLATELET
Basophils Relative: 1 % (ref 0–1)
Eosinophils Relative: 12 % — ABNORMAL HIGH (ref 0–5)
HCT: 26.4 % — ABNORMAL LOW (ref 39.0–52.0)
Hemoglobin: 9.2 g/dL — ABNORMAL LOW (ref 13.0–17.0)
Lymphocytes Relative: 23 % (ref 12–46)
Monocytes Relative: 12 % (ref 3–12)
Neutro Abs: 7.2 10*3/uL (ref 1.7–7.7)
RBC: 3.01 MIL/uL — ABNORMAL LOW (ref 4.22–5.81)
WBC: 13.6 10*3/uL — ABNORMAL HIGH (ref 4.0–10.5)

## 2011-11-17 LAB — COMPREHENSIVE METABOLIC PANEL
ALT: 19 U/L (ref 0–53)
AST: 30 U/L (ref 0–37)
Albumin: 3.9 g/dL (ref 3.5–5.2)
CO2: 26 mEq/L (ref 19–32)
Chloride: 101 mEq/L (ref 96–112)
GFR calc non Af Amer: >90 mL/min (ref >90)
Sodium: 137 mEq/L (ref 135–145)
Total Bilirubin: 2.5 mg/dL — ABNORMAL HIGH (ref 0.3–1.2)

## 2011-11-17 MED ORDER — KETOROLAC TROMETHAMINE 30 MG/ML IJ SOLN
30.0000 mg | Freq: Four times a day (QID) | INTRAMUSCULAR | Status: DC | PRN
  Administered 2011-11-17 – 2011-11-19 (×3): 30 mg via INTRAVENOUS
  Filled 2011-11-17 (×3): qty 1

## 2011-11-17 MED FILL — Ketorolac Tromethamine Inj 15 MG/ML: INTRAMUSCULAR | Qty: 1 | Status: AC

## 2011-11-17 MED FILL — Hydromorphone HCl Preservative Free (PF) Inj 4 MG/ML: INTRAMUSCULAR | Qty: 1 | Status: AC

## 2011-11-17 MED FILL — Promethazine HCl Tab 25 MG: ORAL | Qty: 1 | Status: AC

## 2011-11-17 NOTE — Progress Notes (Signed)
Subjective: [name] was seen on rounds today. He was lying in bed in mild  distress . Father visiting voiced concerns this is the longest he has been in the hospital and they are ready ready for him to be home. He continues to have a lot of discomfort in right shoulder despite injection will f/u with Dr. Dion Saucier in AM and let him know of his status. Rates pain 9/10. Decrease ROM in right arm/shoulder.   Objective: Vital signs in last 24 hours: Filed Vitals:   11/16/11 1746 11/16/11 2211 11/17/11 0603 11/17/11 1406  BP: 128/78 137/83 114/74 139/90  Pulse: 63 67 85 68  Temp: 97.8 F (36.6 C) 98.2 F (36.8 C) 97.9 F (36.6 C) 97.6 F (36.4 C)  TempSrc: Oral Oral Oral Oral  Resp: 20 20 20 18   Height:      Weight:   81.466 kg (179 lb 9.6 oz)   SpO2: 95% 95% 96% 95%   Weight change:   Intake/Output Summary (Last 24 hours) at 11/17/11 2048 Last data filed at 11/17/11 1900  Gross per 24 hour  Intake   6015 ml  Output   3780 ml  Net   2235 ml    Physical Exam: BP 144/92  Pulse 69  Temp 98.3 F (36.8 C) (Oral)  Resp 18  Ht 6\' 2"  (1.88 m)  Wt 81.466 kg (179 lb 9.6 oz)  BMI 23.06 kg/m2  SpO2 93% General Appearance: Alert and oriented, cooperative, well developed, well nourished, acute distress esp with right shoulder movement decrease ROM Head: Normocephalic, atraumatic  Eyes: PERRLA, EOMI, mild scleral icterus  Nose: No sinus tenderness, no visible drainage, septum midline, normal mucosa  Throat: Lips, mucosa and tongue are normal, gums appear normal, dentition good condition  Neck: No adenopathy and supple, symmetrical, trachea midline  Back: Symmetric, no curvature, ROM normal, diffuse tenderness, bilateral CVA tenderness  Resp:  no wheezes/rales rhonchi scattered right base  Cardio: S1, S2 normal, no murmur/click/rub/gallop  GI: Soft, slightly distended, bowel sounds present  Male Genitalia: Deferred, Denies  priapism Extremities: Normal, atraumatic, no cyanosis, Homans  sign is negative,  no edema right shoulder cont's to  hurts  Neurologic: CN II - XII intact  Psych: Appropriate affect  Lab Results:  Basename 11/17/11 0400 11/16/11 0630  NA 137 138  K 3.6 3.8  CL 101 102  CO2 26 27  GLUCOSE 110* 86  BUN 7 9  CREATININE 0.71 0.74  CALCIUM 9.0 8.7  MG -- --  PHOS -- --    Basename 11/17/11 0400 11/16/11 0630  AST 30 26  ALT 19 14  ALKPHOS 78 78  BILITOT 2.5* 2.9*  PROT 7.0 7.1  ALBUMIN 3.9 3.7   No results found for this basename: LIPASE:2,AMYLASE:2 in the last 72 hours  Basename 11/17/11 0400 11/16/11 0630  WBC 13.6* 12.5*  NEUTROABS 7.2 6.4  HGB 9.2* 9.7*  HCT 26.4* 27.3*  MCV 87.7 88.1  PLT 247 243   No results found for this basename: CKTOTAL:3,CKMB:3,CKMBINDEX:3,TROPONINI:3 in the last 72 hours No components found with this basename: POCBNP:3 No results found for this basename: DDIMER:2 in the last 72 hours No results found for this basename: HGBA1C:2 in the last 72 hours No results found for this basename: CHOL:2,HDL:2,LDLCALC:2,TRIG:2,CHOLHDL:2,LDLDIRECT:2 in the last 72 hours No results found for this basename: TSH,T4TOTAL,FREET3,T3FREE,THYROIDAB in the last 72 hours No results found for this basename: VITAMINB12:2,FOLATE:2,FERRITIN:2,TIBC:2,IRON:2,RETICCTPCT:2 in the last 72 hours  Micro Results: No results found for this or any  previous visit (from the past 240 hour(s)).  Studies/Results: No results found.  Medications: Scheduled Meds:    . enoxaparin (LOVENOX) injection  40 mg Subcutaneous Q24H  . folic acid  1 mg Oral Daily  . lactulose  20 g Oral BID  . nicotine  14 mg Transdermal Daily  . pantoprazole  40 mg Oral Q1200   Or  . pantoprazole (PROTONIX) IV  40 mg Intravenous Q1200   Continuous Infusions:    . dextrose 5 % and 0.45% NaCl 125 mL/hr at 11/17/11 1704   PRN Meds:.alum & mag hydroxide-simeth, diphenhydrAMINE, HYDROmorphone (DILAUDID) injection, ketorolac, oxyCODONE, promethazine, promethazine,  sodium chloride, DISCONTD: ketorolac  Assessment/Plan: Principal Problem:  Sickle cell anemia with crisis :  Patient is in active hemolysis, IVF, pain ,pruritis, bowel management and DVT prophylaxis (recently had exchanges and blood transfusion considering another one for tomorrow if no progression  Tobacco abuse: nicotine patch   Anemia: secondary to SCD Right shoulder pain: s/p shoulder injection feels no  Worst or better Increased Toradol to 30 mg for better pain controll Constipation : resolved  Questionable HTN monitor: may be secondary to uncontrol pain    LOS: 10 days  EDWARDS, MICHELLE P 11/17/2011, 8:48 PM

## 2011-11-18 LAB — COMPREHENSIVE METABOLIC PANEL
CO2: 27 mEq/L (ref 19–32)
Calcium: 8.9 mg/dL (ref 8.4–10.5)
Creatinine, Ser: 0.71 mg/dL (ref 0.50–1.35)
GFR calc Af Amer: >90 mL/min (ref >90)
GFR calc non Af Amer: >90 mL/min (ref >90)
Glucose, Bld: 117 mg/dL — ABNORMAL HIGH (ref 70–99)
Sodium: 137 mEq/L (ref 135–145)
Total Protein: 7.1 g/dL (ref 6.0–8.3)

## 2011-11-18 LAB — CBC WITH DIFFERENTIAL/PLATELET
Basophils Relative: 1 % (ref 0–1)
Eosinophils Relative: 11 % — ABNORMAL HIGH (ref 0–5)
HCT: 27.2 % — ABNORMAL LOW (ref 39.0–52.0)
Hemoglobin: 9.5 g/dL — ABNORMAL LOW (ref 13.0–17.0)
Lymphocytes Relative: 22 % (ref 12–46)
Monocytes Relative: 11 % (ref 3–12)
Neutro Abs: 7.8 10*3/uL — ABNORMAL HIGH (ref 1.7–7.7)
Neutrophils Relative %: 55 % (ref 43–77)
RBC: 3.1 MIL/uL — ABNORMAL LOW (ref 4.22–5.81)

## 2011-11-18 MED ORDER — MOXIFLOXACIN HCL IN NACL 400 MG/250ML IV SOLN
400.0000 mg | INTRAVENOUS | Status: DC
  Administered 2011-11-18 – 2011-11-19 (×2): 400 mg via INTRAVENOUS
  Filled 2011-11-18 (×3): qty 250

## 2011-11-18 MED ORDER — ACETAMINOPHEN 325 MG PO TABS
650.0000 mg | ORAL_TABLET | Freq: Once | ORAL | Status: AC
  Administered 2011-11-18: 650 mg via ORAL
  Filled 2011-11-18: qty 2

## 2011-11-18 MED ORDER — DIPHENHYDRAMINE HCL 50 MG/ML IJ SOLN
25.0000 mg | Freq: Once | INTRAMUSCULAR | Status: AC
  Administered 2011-11-18: 25 mg via INTRAVENOUS
  Filled 2011-11-18: qty 1

## 2011-11-18 NOTE — Progress Notes (Signed)
Spoke to IV team RN upon notification of blood being ready, to let them be aware of exchange needed prior to transfusion. He is on list to be seen.

## 2011-11-18 NOTE — Progress Notes (Signed)
Subjective: [name] was seen on rounds today. He was lying in bed in no acute distress . He continues to have a lot of discomfort in right shoulder despite injection, Spoke with Dr. Dion Saucier he suggested placing arm in a sling/insaids and f/u with him after d/c on out patient bases and pt may need to consider surgical intervention. Rates pain 7/10. Decrease ROM in right arm/shoulder.  Feels Toradol may have help some rested well last night. Ordered blood exchange and transfusions d/w pt and he is in agreement with tx,    Objective: Vital signs in last 24 hours: Filed Vitals:   11/17/11 0603 11/17/11 1406 11/17/11 2122 11/18/11 0523  BP: 114/74 139/90 144/89 159/89  Pulse: 85 68 65 64  Temp: 97.9 F (36.6 C) 97.6 F (36.4 C) 98.6 F (37 C) 98.4 F (36.9 C)  TempSrc: Oral Oral Oral Oral  Resp: 20 18 18 20   Height:      Weight: 81.466 kg (179 lb 9.6 oz)   82 kg (180 lb 12.4 oz)  SpO2: 96% 95% 96% 97%   Weight change: 0.534 kg (1 lb 2.8 oz)  Intake/Output Summary (Last 24 hours) at 11/18/11 1221 Last data filed at 11/18/11 1100  Gross per 24 hour  Intake 3699.17 ml  Output   4100 ml  Net -400.83 ml    Physical Exam: BP 144/92  Pulse 69  Temp 98.3 F (36.8 C) (Oral)  Resp 18  Ht 6\' 2"  (1.88 m)  Wt 81.466 kg (179 lb 9.6 oz)  BMI 23.06 kg/m2  SpO2 93% General Appearance: Alert and oriented, cooperative, well developed, well nourished, acute distress esp with right shoulder movement decrease ROM Head: Normocephalic, atraumatic  Eyes: PERRLA, EOMI, mild scleral icterus  Nose: No sinus tenderness, no visible drainage, septum midline, normal mucosa  Throat: Lips, mucosa and tongue are normal, gums appear normal, dentition good condition  Neck: No adenopathy and supple, symmetrical, trachea midline  Back: Symmetric, no curvature, ROM normal, diffuse tenderness, bilateral CVA tenderness  Resp:  no wheezes/rales rhonchi CTA Cardio: S1, S2 normal, no murmur/click/rub/gallop  GI:  Soft, slightly distended, bowel sounds present  Male Genitalia: Deferred, Denies  priapism Extremities: Normal, atraumatic, no cyanosis, Homans sign is negative,  no edema right shoulder cont's to  hurts  Neurologic: CN II - XII intact  Psych: Appropriate affect  Lab Results:  Basename 11/18/11 0510 11/17/11 0400  NA 137 137  K 3.7 3.6  CL 102 101  CO2 27 26  GLUCOSE 117* 110*  BUN 7 7  CREATININE 0.71 0.71  CALCIUM 8.9 9.0  MG -- --  PHOS -- --    Basename 11/18/11 0510 11/17/11 0400  AST 34 30  ALT 26 19  ALKPHOS 86 78  BILITOT 2.2* 2.5*  PROT 7.1 7.0  ALBUMIN 3.9 3.9   No results found for this basename: LIPASE:2,AMYLASE:2 in the last 72 hours  Basename 11/18/11 0510 11/17/11 0400  WBC 14.2* 13.6*  NEUTROABS 7.8* 7.2  HGB 9.5* 9.2*  HCT 27.2* 26.4*  MCV 87.7 87.7  PLT 268 247   No results found for this basename: CKTOTAL:3,CKMB:3,CKMBINDEX:3,TROPONINI:3 in the last 72 hours No components found with this basename: POCBNP:3 No results found for this basename: DDIMER:2 in the last 72 hours No results found for this basename: HGBA1C:2 in the last 72 hours No results found for this basename: CHOL:2,HDL:2,LDLCALC:2,TRIG:2,CHOLHDL:2,LDLDIRECT:2 in the last 72 hours No results found for this basename: TSH,T4TOTAL,FREET3,T3FREE,THYROIDAB in the last 72 hours No results  found for this basename: VITAMINB12:2,FOLATE:2,FERRITIN:2,TIBC:2,IRON:2,RETICCTPCT:2 in the last 72 hours  Micro Results: No results found for this or any previous visit (from the past 240 hour(s)).  Studies/Results: No results found.  Medications: Scheduled Meds:    . acetaminophen  650 mg Oral Once  . diphenhydrAMINE  25 mg Intravenous Once  . enoxaparin (LOVENOX) injection  40 mg Subcutaneous Q24H  . folic acid  1 mg Oral Daily  . lactulose  20 g Oral BID  . nicotine  14 mg Transdermal Daily  . pantoprazole  40 mg Oral Q1200   Or  . pantoprazole (PROTONIX) IV  40 mg Intravenous Q1200    Continuous Infusions:    . dextrose 5 % and 0.45% NaCl 125 mL/hr at 11/18/11 0930   PRN Meds:.alum & mag hydroxide-simeth, diphenhydrAMINE, HYDROmorphone (DILAUDID) injection, ketorolac, oxyCODONE, promethazine, promethazine, sodium chloride, DISCONTD: ketorolac  Assessment/Plan: Principal Problem:  Sickle cell anemia with crisis :  Patient is in active hemolysis, IVF, pain ,pruritis, bowel management and DVT prophylaxis ordered exchanges and blood transfusion for today  Tobacco abuse: nicotine patch   Anemia: secondary to SCD Right shoulder pain: s/p shoulder injection cont  Toradol ordered sling per ortho recommendation  Constipation : resolved  Questionable HTN monitor: may be secondary to uncontrol pain  Leucocytosis/(hx) ACS : abt's  Initiated    LOS: 11 days  EDWARDS, MICHELLE P 11/18/2011, 12:21 PM

## 2011-11-19 LAB — TYPE AND SCREEN
ABO/RH(D): O POS
Antibody Screen: NEGATIVE

## 2011-11-19 LAB — COMPREHENSIVE METABOLIC PANEL
AST: 33 U/L (ref 0–37)
Albumin: 3.8 g/dL (ref 3.5–5.2)
Alkaline Phosphatase: 88 U/L (ref 39–117)
BUN: 8 mg/dL (ref 6–23)
Chloride: 101 mEq/L (ref 96–112)
Potassium: 3.9 mEq/L (ref 3.5–5.1)
Total Bilirubin: 1.9 mg/dL — ABNORMAL HIGH (ref 0.3–1.2)

## 2011-11-19 LAB — CBC WITH DIFFERENTIAL/PLATELET
Basophils Relative: 1 % (ref 0–1)
Eosinophils Relative: 9 % — ABNORMAL HIGH (ref 0–5)
HCT: 28.9 % — ABNORMAL LOW (ref 39.0–52.0)
Hemoglobin: 10.3 g/dL — ABNORMAL LOW (ref 13.0–17.0)
MCH: 31 pg (ref 26.0–34.0)
MCV: 87 fL (ref 78.0–100.0)
Monocytes Absolute: 1.7 10*3/uL — ABNORMAL HIGH (ref 0.1–1.0)
Neutro Abs: 8.5 10*3/uL — ABNORMAL HIGH (ref 1.7–7.7)
Neutrophils Relative %: 60 % (ref 43–77)
RBC: 3.32 MIL/uL — ABNORMAL LOW (ref 4.22–5.81)

## 2011-11-19 NOTE — Progress Notes (Signed)
Subjective: [name] was seen on rounds today. He was lying in bed in no acute distress . He continues to have a lot of discomfort in right shoulder despite injection Blood exchange and transfusions tolerated well. Rates pain 7/10 chest feels better after transfusion.    Objective: Vital signs in last 24 hours: Filed Vitals:   11/18/11 1915 11/18/11 2202 11/19/11 0218 11/19/11 0553  BP: 136/85 137/88 127/83 134/88  Pulse: 77 76 74 67  Temp: 98.5 F (36.9 C) 97.8 F (36.6 C) 98.3 F (36.8 C) 97.9 F (36.6 C)  TempSrc: Oral Oral Oral Oral  Resp: 18 12  16   Height:      Weight:    83.1 kg (183 lb 3.2 oz)  SpO2:  96% 96% 98%   Weight change: 1.1 kg (2 lb 6.8 oz)  Intake/Output Summary (Last 24 hours) at 11/19/11 1206 Last data filed at 11/19/11 0700  Gross per 24 hour  Intake 4242.08 ml  Output   1400 ml  Net 2842.08 ml    Physical Exam: BP 144/92  Pulse 69  Temp 98.3 F (36.8 C) (Oral)  Resp 18  Ht 6\' 2"  (1.88 m)  Wt 81.466 kg (179 lb 9.6 oz)  BMI 23.06 kg/m2  SpO2 93% General Appearance: Alert and oriented, cooperative, well developed, well nourished, acute distress esp with right shoulder movement decrease ROM Head: Normocephalic, atraumatic  Eyes: PERRLA, EOMI, mild scleral icterus  Nose: No sinus tenderness, no visible drainage, septum midline, normal mucosa  Throat: Lips, mucosa and tongue are normal, gums appear normal, dentition good condition  Neck: No adenopathy and supple, symmetrical, trachea midline  Back: Symmetric, no curvature, ROM normal, diffuse tenderness, bilateral CVA tenderness  Resp:  no wheezes/rales rhonchi CTA Cardio: S1, S2 normal, no murmur/click/rub/gallop  GI: Soft, slightly distended, bowel sounds present  Male Genitalia: Deferred, Denies priapism Extremities: Normal, atraumatic, no cyanosis, Homans sign is negative,  no edema right shoulder cont's to  hurts  Neurologic: CN II - XII intact  Psych: Appropriate affect  Lab  Results:  [name] 11/19/11 0416 11/18/11 0510  NA 137 137  K 3.9 3.7  CL 101 102  CO2 26 27  GLUCOSE 76 117*  BUN 8 7  CREATININE 0.73 0.71  CALCIUM 8.9 8.9  MG -- --  PHOS -- --    Basename 11/19/11 0416 11/18/11 0510  AST 33 34  ALT 28 26  ALKPHOS 88 86  BILITOT 1.9* 2.2*  PROT 7.1 7.1  ALBUMIN 3.8 3.9   No results found for this basename: LIPASE:2,AMYLASE:2 in the last 72 hours  Basename 11/19/11 0416 11/18/11 0510  WBC 14.2* 14.2*  NEUTROABS 8.5* 7.8*  HGB 10.3* 9.5*  HCT 28.9* 27.2*  MCV 87.0 87.7  PLT 222 268   No results found for this basename: CKTOTAL:3,CKMB:3,CKMBINDEX:3,TROPONINI:3 in the last 72 hours No components found with this basename: POCBNP:3 No results found for this basename: DDIMER:2 in the last 72 hours No results found for this basename: HGBA1C:2 in the last 72 hours No results found for this basename: CHOL:2,HDL:2,LDLCALC:2,TRIG:2,CHOLHDL:2,LDLDIRECT:2 in the last 72 hours No results found for this basename: TSH,T4TOTAL,FREET3,T3FREE,THYROIDAB in the last 72 hours No results found for this basename: VITAMINB12:2,FOLATE:2,FERRITIN:2,TIBC:2,IRON:2,RETICCTPCT:2 in the last 72 hours  Micro Results: No results found for this or any previous visit (from the past 240 hour(s)).  Studies/Results: No results found.  Medications: Scheduled Meds:    . acetaminophen  650 mg Oral Once  . diphenhydrAMINE  25 mg Intravenous Once  .  enoxaparin (LOVENOX) injection  40 mg Subcutaneous Q24H  . folic acid  1 mg Oral Daily  . lactulose  20 g Oral BID  . moxifloxacin  400 mg Intravenous Q24H  . nicotine  14 mg Transdermal Daily  . pantoprazole  40 mg Oral Q1200   Or  . pantoprazole (PROTONIX) IV  40 mg Intravenous Q1200   Continuous Infusions:    . dextrose 5 % and 0.45% NaCl 125 mL/hr at 11/19/11 1026   PRN Meds:.alum & mag hydroxide-simeth, diphenhydrAMINE, HYDROmorphone (DILAUDID) injection, ketorolac, oxyCODONE, promethazine, promethazine,  sodium chloride  Assessment/Plan: Principal Problem:  Sickle cell anemia with crisis :  Patient is in active hemolysis, IVF, pain ,pruritis, bowel management and DVT prophylaxis Tobacco abuse: nicotine patch   Anemia: secondary to SCD Right shoulder pain: s/p shoulder injection cont  Toradol  & sling providing minimal improvement   Constipation : resolved  Questionable HTN monitor: resent BP  Acceptable  Leucocytosis/(hx) ACS : abt's    Discussed discharge planing in 24 hrs  LOS: 12 days  [name] P 11/19/2011, 12:06 PM

## 2011-11-20 LAB — COMPREHENSIVE METABOLIC PANEL
ALT: 29 U/L (ref 0–53)
AST: 31 U/L (ref 0–37)
Calcium: 8.9 mg/dL (ref 8.4–10.5)
GFR calc Af Amer: >90 mL/min (ref >90)
Sodium: 135 mEq/L (ref 135–145)
Total Protein: 7.1 g/dL (ref 6.0–8.3)

## 2011-11-20 LAB — CBC WITH DIFFERENTIAL/PLATELET
Basophils Absolute: 0.1 10*3/uL (ref 0.0–0.1)
Eosinophils Absolute: 1.5 10*3/uL — ABNORMAL HIGH (ref 0.0–0.7)
Eosinophils Relative: 12 % — ABNORMAL HIGH (ref 0–5)
MCH: 30.6 pg (ref 26.0–34.0)
MCV: 87.5 fL (ref 78.0–100.0)
Platelets: 248 10*3/uL (ref 150–400)
RDW: 15.5 % (ref 11.5–15.5)
WBC: 12.1 10*3/uL — ABNORMAL HIGH (ref 4.0–10.5)

## 2011-11-20 MED ORDER — CELECOXIB 100 MG PO CAPS: 100.0000 mg | ORAL_CAPSULE | Freq: Two times a day (BID) | ORAL | Status: AC

## 2011-11-20 MED ORDER — PROMETHAZINE HCL 25 MG PO TABS: 25.0000 mg | ORAL_TABLET | Freq: Four times a day (QID) | ORAL | Status: DC | PRN

## 2011-11-20 MED ORDER — MORPHINE SULFATE CR 60 MG PO TB12: 60.0000 mg | ORAL_TABLET | Freq: Two times a day (BID) | ORAL | Status: DC

## 2011-11-20 MED ORDER — MORPHINE SULFATE 30 MG PO TABS: 30.0000 mg | ORAL_TABLET | ORAL | Status: DC | PRN

## 2011-11-20 NOTE — Discharge Summary (Signed)
[facility] Discharge Summary   Patient ID: [name] MRN: [id number] DOB/AGE: 1975/05/29 36 y.o.  Admit date: 11/07/2011 Discharge date: 11/20/2011  Primary Care Physician:  Dorrene German, MD  Admission Diagnoses:  Principal Problem:  *Sickle cell anemia with crisis Active Problems:  Acute chest syndrome  Tobacco abuse  Anemia   Discharge Diagnoses:   Sickle cell crisis  AVN  Discharge Medications:  Medication List  As of 11/20/2011 12:55 PM   TAKE these medications         celecoxib 100 MG capsule   Commonly known as: CELEBREX   Take 1 capsule (100 mg total) by mouth 2 (two) times daily.      diclofenac sodium 1 % Gel   Commonly known as: VOLTAREN   Apply 1 application topically 4 (four) times daily.      folic acid 1 MG tablet   Commonly known as: FOLVITE   Take 1 tablet (1 mg total) by mouth daily.      morphine 60 MG 12 hr tablet   Commonly known as: MS CONTIN   Take 1 tablet (60 mg total) by mouth 2 (two) times daily.      morphine 30 MG tablet   Commonly known as: MSIR   Take 1 tablet (30 mg total) by mouth every 4 (four) hours as needed for pain. Breakthrough pain      promethazine 25 MG tablet   Commonly known as: PHENERGAN   Take 1 tablet (25 mg total) by mouth every 6 (six) hours as needed.             Consults:  Orthopedic   Significant Diagnostic Studies:  Dg Chest 2 View  11/10/2011  *RADIOLOGY REPORT*  Clinical Data: Right-sided chest pain and right shoulder pain. Sickle cell disease.  CHEST - 2 VIEW  Comparison: 11/07/2011  Findings: There is chronic accentuation of the interstitial markings.  Heart size and vascularity are normal.  Minimal scarring at the lung bases.  No acute abnormalities.  The bones are slightly sclerotic consistent with a history of sickle cell disease.  No effusions.  IMPRESSION: No acute abnormalities.  Original Report Authenticated By: Gwynn Burly, M.D.   Dg Chest 2 View  11/07/2011   *RADIOLOGY REPORT*  Clinical Data: Chest pain.  Lower rib pain.  Sickle cell crisis.  CHEST - 2 VIEW  Comparison: 08/20/2011.  Findings: Normal cardiac and mediastinal silhouette.  Normal vascularity.  Prominent lung markings due to scarring.  No acute infiltrate is seen.  There is no effusion or pneumothorax. Similar appearance to priors.  IMPRESSION: Stable chest.  Chronic lung disease.  Original Report Authenticated By: Elsie Stain, M.D.   Dg Shoulder Right  11/10/2011  *RADIOLOGY REPORT*  Clinical Data: Right shoulder pain.  RIGHT SHOULDER - 2+ VIEW  Comparison: Radiographs and a MRI dated 02/02/2006  Findings: There is sclerosis of the right humeral head and proximal neck consistent with multiple bone infarcts as demonstrated on the prior MRI scan.  No collapse of the humeral head.  Other osseous structures of the shoulder demonstrate no abnormalities.  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  Original Report Authenticated By: Gwynn Burly, M.D.     [facility] Course:  For complete details please refer to admission H and P, but in brief, Mr .Spiers is an 36 y.o. male with sickle Cell Albright Disease who presents to the ED with complaints of Left sided sharp Chest  pain rated at a 10 /10 occurring intermittently X 1 week. Patient reports having no relief with his home medications. He was admitted for further evaluation and aggressive treatment. Left shoulder pain became aggressively worst ortho was consulted and tx in hospital he is to f/u on out patient bases. Also, received several exchanges and blood transfusions during this hospitalization.     Physical Exam at Discharge:  BP 121/75  Pulse 66  Temp 98.1 F (36.7 C) (Oral)  Resp 16  Ht 6\' 2"  (1.88 m)  Wt 82.6 kg (182 lb 1.6 oz)  BMI 23.38 kg/m2  SpO2 98%  General Appearance: Alert and oriented, cooperative, well developed, well nourished, acute distress esp with right shoulder movement  decrease ROM  Resp: no wheezes/rales rhonchi CTA  Cardio: S1, S2 normal, no murmur/click/rub/gallop  GI: Soft, slightly distended, bowel sounds present  Male Genitalia: Deferred, Denies priapism  Extremities: Normal, atraumatic, no cyanosis, Homans sign is negative, no edema right shoulder cont's to hurts   Disposition at Discharge: 01-Home or Self Care  Discharge Orders: Follow up with primary in 1 week and call Dr. August Saucer office at [phone] for follow- up in 2 weeks   Condition at Discharge:   Stable  Time spent on Discharge:  Greater than 30 minutes.  SignedGrayce Sessions 11/20/2011, 12:55 PM

## 2012-04-28 ENCOUNTER — Observation Stay (HOSPITAL_COMMUNITY)
Admission: EM | Admit: 2012-04-28 | Discharge: 2012-04-28 | Disposition: A | Discharge status: Other Acute Inpatient Hospital | Payer: Medicare Other | Source: Home / Self Care | Attending: Emergency Medicine | Admitting: Emergency Medicine

## 2012-04-28 ENCOUNTER — Telehealth (HOSPITAL_COMMUNITY): Payer: Self-pay

## 2012-04-28 ENCOUNTER — Encounter (HOSPITAL_COMMUNITY): Payer: Self-pay | Admitting: *Deleted

## 2012-04-28 ENCOUNTER — Emergency Department (HOSPITAL_COMMUNITY): Payer: Medicare Other

## 2012-04-28 ENCOUNTER — Inpatient Hospital Stay (HOSPITAL_COMMUNITY)
Admission: AD | Admit: 2012-04-28 | Discharge: 2012-05-04 | DRG: 812 | Disposition: A | Discharge status: Home / Self Care | Payer: Medicare Other | Attending: Internal Medicine | Admitting: Internal Medicine

## 2012-04-28 DIAGNOSIS — M87059 Idiopathic aseptic necrosis of unspecified femur: Secondary | ICD-10-CM | POA: Diagnosis present

## 2012-04-28 DIAGNOSIS — Z79899 Other long term (current) drug therapy: Secondary | ICD-10-CM

## 2012-04-28 DIAGNOSIS — E876 Hypokalemia: Secondary | ICD-10-CM | POA: Diagnosis not present

## 2012-04-28 DIAGNOSIS — Z87891 Personal history of nicotine dependence: Secondary | ICD-10-CM

## 2012-04-28 DIAGNOSIS — R7989 Other specified abnormal findings of blood chemistry: Secondary | ICD-10-CM

## 2012-04-28 DIAGNOSIS — Z96649 Presence of unspecified artificial hip joint: Secondary | ICD-10-CM

## 2012-04-28 DIAGNOSIS — K5900 Constipation, unspecified: Secondary | ICD-10-CM | POA: Diagnosis present

## 2012-04-28 DIAGNOSIS — Z720 Tobacco use: Secondary | ICD-10-CM | POA: Diagnosis present

## 2012-04-28 DIAGNOSIS — D72829 Elevated white blood cell count, unspecified: Secondary | ICD-10-CM | POA: Diagnosis not present

## 2012-04-28 DIAGNOSIS — D638 Anemia in other chronic diseases classified elsewhere: Secondary | ICD-10-CM | POA: Diagnosis present

## 2012-04-28 DIAGNOSIS — D5700 Hb-SS disease with crisis, unspecified: Principal | ICD-10-CM | POA: Diagnosis present

## 2012-04-28 DIAGNOSIS — D5701 Hb-SS disease with acute chest syndrome: Secondary | ICD-10-CM

## 2012-04-28 DIAGNOSIS — D649 Anemia, unspecified: Secondary | ICD-10-CM

## 2012-04-28 LAB — CBC WITH DIFFERENTIAL/PLATELET
Basophils Relative: 0 % (ref 0–1)
Eosinophils Absolute: 0.5 10*3/uL (ref 0.0–0.7)
HCT: 30.5 % — ABNORMAL LOW (ref 39.0–52.0)
Hemoglobin: 8.5 g/dL — ABNORMAL LOW (ref 13.0–17.0)
Lymphs Abs: 1.8 10*3/uL (ref 0.7–4.0)
MCH: 25.2 pg — ABNORMAL LOW (ref 26.0–34.0)
MCHC: 27.9 g/dL — ABNORMAL LOW (ref 30.0–36.0)
MCV: 90.5 fL (ref 78.0–100.0)
Monocytes Absolute: 1.4 10*3/uL — ABNORMAL HIGH (ref 0.1–1.0)
Neutro Abs: 9.9 10*3/uL — ABNORMAL HIGH (ref 1.7–7.7)
Neutrophils Relative %: 73 % (ref 43–77)

## 2012-04-28 LAB — COMPREHENSIVE METABOLIC PANEL
BUN: 14 mg/dL (ref 6–23)
CO2: 25 mEq/L (ref 19–32)
Calcium: 9.7 mg/dL (ref 8.4–10.5)
Creatinine, Ser: 0.73 mg/dL (ref 0.50–1.35)
GFR calc Af Amer: >90 mL/min (ref >90)
GFR calc non Af Amer: >90 mL/min (ref >90)
Glucose, Bld: 86 mg/dL (ref 70–99)
Total Bilirubin: 2.5 mg/dL — ABNORMAL HIGH (ref 0.3–1.2)

## 2012-04-28 LAB — RETICULOCYTES: RBC.: 3.37 MIL/uL — ABNORMAL LOW (ref 4.22–5.81)

## 2012-04-28 MED ORDER — MORPHINE SULFATE 30 MG PO TABS
30.0000 mg | ORAL_TABLET | ORAL | Status: DC | PRN
  Administered 2012-04-29: 30 mg via ORAL
  Filled 2012-04-28: qty 2

## 2012-04-28 MED ORDER — ACETAMINOPHEN 325 MG PO TABS: 650.0000 mg | ORAL_TABLET | Freq: Four times a day (QID) | ORAL | Status: DC | PRN

## 2012-04-28 MED ORDER — MORPHINE SULFATE ER 30 MG PO TBCR
60.0000 mg | EXTENDED_RELEASE_TABLET | Freq: Two times a day (BID) | ORAL | Status: DC
  Administered 2012-04-29 (×3): 60 mg via ORAL
  Filled 2012-04-28 (×3): qty 2

## 2012-04-28 MED ORDER — FOLIC ACID 1 MG PO TABS
1.0000 mg | ORAL_TABLET | Freq: Every day | ORAL | Status: DC
Start: 2012-04-29 — End: 2012-04-29
  Administered 2012-04-29: 1 mg via ORAL
  Filled 2012-04-28: qty 1

## 2012-04-28 MED ORDER — KETOROLAC TROMETHAMINE 30 MG/ML IJ SOLN
30.0000 mg | Freq: Three times a day (TID) | INTRAMUSCULAR | Status: DC | PRN
  Administered 2012-04-29 (×2): 30 mg via INTRAVENOUS
  Filled 2012-04-28 (×2): qty 1

## 2012-04-28 MED ORDER — NICOTINE 14 MG/24HR TD PT24: 14.0000 mg | MEDICATED_PATCH | Freq: Every day | TRANSDERMAL | Status: DC

## 2012-04-28 MED ORDER — DEXTROSE-NACL 5-0.45 % IV SOLN
INTRAVENOUS | Status: DC
  Administered 2012-04-29 (×3): via INTRAVENOUS

## 2012-04-28 MED ORDER — PROMETHAZINE HCL 25 MG/ML IJ SOLN
12.5000 mg | Freq: Four times a day (QID) | INTRAMUSCULAR | Status: DC | PRN
  Administered 2012-04-29 (×2): 12.5 mg via INTRAVENOUS
  Administered 2012-04-29: 25 mg via INTRAVENOUS
  Filled 2012-04-28 (×3): qty 1

## 2012-04-28 MED ORDER — DICLOFENAC SODIUM 1 % TD GEL
2.0000 g | Freq: Four times a day (QID) | TRANSDERMAL | Status: DC
  Administered 2012-04-29 (×2): 2 g via TOPICAL
  Filled 2012-04-28: qty 100

## 2012-04-28 MED ORDER — HYDROMORPHONE HCL PF 2 MG/ML IJ SOLN
2.0000 mg | INTRAMUSCULAR | Status: DC | PRN
  Administered 2012-04-28 – 2012-04-29 (×8): 2 mg via INTRAVENOUS
  Filled 2012-04-28 (×9): qty 1

## 2012-04-28 MED ORDER — HYDROMORPHONE HCL PF 2 MG/ML IJ SOLN
2.0000 mg | Freq: Once | INTRAMUSCULAR | Status: AC
  Administered 2012-04-28: 2 mg via INTRAVENOUS
  Filled 2012-04-28: qty 1

## 2012-04-28 MED ORDER — NICOTINE 14 MG/24HR TD PT24
14.0000 mg | MEDICATED_PATCH | Freq: Every day | TRANSDERMAL | Status: DC
  Filled 2012-04-28: qty 1

## 2012-04-28 NOTE — Progress Notes (Signed)
Report given by [facility] RN@[facility]  ED, patient has 24 g Left hand, had 3 doses of Dilaudid prior to [facility] pain 10/10. Nicoderm patch in place prior to admission. Patient transported [facility] with all personal belongings. Pain rated 9 out of 10 on scale 0-10.  This RN will continue to monitor

## 2012-04-28 NOTE — H&P (Signed)
Have seen and examined the patient. Agree with above note and plan. Will admit to sickle cell clinic per sickle cell protocol.   [name] 12:01 AM

## 2012-04-28 NOTE — H&P (Signed)
[name] is an 37 y.o. male.   PCP: Scherrie Gerlach Chief Complaint: right shoulder and anteriolateral rib cage pain unrelieved by home pain regimen HPI: [name] is a pleasant, 37 yo AAM who presents to the [facility] ED today for sickle cell pain unrelieved by home pain medication regimen. He states his pain to the left anterior/lateral rib cage and shoulder began about 2 weeks ago. However, his father had just had surgery and pt lives with father, so pt says he tried to "tough it out at home". His pain has increased daily and today became a 10/10 so he decided to come to ED. His normal goal pain level at home is 3/10 and is normally controlled by his home meds. He has a hx of hospitalization for SS with blood transfusions and exchange transfusion here in July 2013.  He denies any cold or flu like sx, n/v/d, abd pain, left sided chest pain, other joint pain than aforementioned, SOB, fever, or sputum production.  Therefore, because of the pt's unrelieved sickle cell pain at home on normal pain meds, [facility] are asked to admit. After reviewing chart, labs, and tests, it is felt the pt is a candidate for the out patient sickle cell center for 23 hr OBS.    Past Medical History  Diagnosis Date  . Sickle cell crisis   . Blood transfusion   . Avascular necrosis of hip     bilateral  . Infection of bone, shoulder region     left shoulder  . Pneumonia   . Avascular necrosis of hip, left 08/27/2011    Past Surgical History  Procedure Date  . Orif right hip fracture 1995  . Joint replacement 2006    right total hip arthroplasty    No family history on file. Social History:  reports that he quit smoking about 3 months ago. His smoking use included Cigarettes. He smoked .5 packs per day. He has never used smokeless tobacco. He reports that he does not drink alcohol or use illicit drugs.  Allergies: No Known Allergies  Medications Prior to Admission  Medication Sig Dispense Refill  .  diclofenac sodium (VOLTAREN) 1 % GEL Apply 1 application topically 4 (four) times daily.      . folic acid (FOLVITE) 1 MG tablet Take 1 tablet (1 mg total) by mouth daily.      Marland Kitchen morphine (MS CONTIN) 60 MG 12 hr tablet Take 1 tablet (60 mg total) by mouth 2 (two) times daily.  40 tablet  0  . morphine (MSIR) 30 MG tablet Take 1 tablet (30 mg total) by mouth every 4 (four) hours as needed for pain. Breakthrough pain  60 tablet  0  . nicotine (NICODERM CQ - DOSED IN MG/24 HOURS) 14 mg/24hr patch Place 1 patch onto the skin daily.      . promethazine (PHENERGAN) 25 MG tablet Take 1 tablet (25 mg total) by mouth every 6 (six) hours as needed.  60 tablet  0    Results for orders placed during the hospital encounter of 04/28/12 (from the past 48 hour(s))  CBC WITH DIFFERENTIAL     Status: Abnormal   Collection Time   04/28/12  4:40 PM      Component Value Range Comment   WBC 13.6 (*) 4.0 - 10.5 K/uL    RBC 3.37 (*) 4.22 - 5.81 MIL/uL    Hemoglobin 8.5 (*) 13.0 - 17.0 g/dL    HCT 16.1 (*) 09.6 - 52.0 %  MCV 90.5  78.0 - 100.0 fL    MCH 25.2 (*) 26.0 - 34.0 pg    MCHC 27.9 (*) 30.0 - 36.0 g/dL    RDW 45.4  09.8 - 11.9 %    Platelets 239  150 - 400 K/uL    Neutrophils Relative 73  43 - 77 %    Lymphocytes Relative 13  12 - 46 %    Monocytes Relative 10  3 - 12 %    Eosinophils Relative 4  0 - 5 %    Basophils Relative 0  0 - 1 %    Neutro Abs 9.9 (*) 1.7 - 7.7 K/uL    Lymphs Abs 1.8  0.7 - 4.0 K/uL    Monocytes Absolute 1.4 (*) 0.1 - 1.0 K/uL    Eosinophils Absolute 0.5  0.0 - 0.7 K/uL    Basophils Absolute 0.0  0.0 - 0.1 K/uL    RBC Morphology TARGET CELLS   SPHEROCYTES   Smear Review PLATELET COUNT CONFIRMED BY SMEAR     COMPREHENSIVE METABOLIC PANEL     Status: Abnormal   Collection Time   04/28/12  4:40 PM      Component Value Range Comment   Sodium 139  135 - 145 mEq/L    Potassium 3.9  3.5 - 5.1 mEq/L    Chloride 102  96 - 112 mEq/L    CO2 25  19 - 32 mEq/L    Glucose, Bld 86  70  - 99 mg/dL    BUN 14  6 - 23 mg/dL    Creatinine, Ser 1.47  0.50 - 1.35 mg/dL    Calcium 9.7  8.4 - 82.9 mg/dL    Total Protein 7.8  6.0 - 8.3 g/dL    Albumin 4.3  3.5 - 5.2 g/dL    AST 19  0 - 37 U/L    ALT 9  0 - 53 U/L    Alkaline Phosphatase 86  39 - 117 U/L    Total Bilirubin 2.5 (*) 0.3 - 1.2 mg/dL    GFR calc non Af Amer >90  >90 mL/min    GFR calc Af Amer >90  >90 mL/min   RETICULOCYTES     Status: Abnormal   Collection Time   04/28/12  4:40 PM      Component Value Range Comment   Retic Ct Pct 4.0 (*) 0.4 - 3.1 %    RBC. 3.37 (*) 4.22 - 5.81 MIL/uL    Retic Count, Manual 134.8  19.0 - 186.0 K/uL    Dg Chest 2 View  04/28/2012  *RADIOLOGY REPORT*  Clinical Data: Sickle cell crisis  CHEST - 2 VIEW  Comparison: Chest radiograph 11/10/2011  Findings: Normal cardiac silhouette.  The ascending aorta is prominent.  There is chronic scarring within the lungs.  No focal consolidation.  Trace pleural effusions.  IMPRESSION: 1.  No evidence of overt pulmonary edema or pneumonia. 2.   Chronic scarring of the lungs.  3.  Prominent ascending aorta.  This can be associated with aortic stenosis.   Original Report Authenticated By: Genevive Bi, M.D.    Dg Chest Port 1 View  04/28/2012  *RADIOLOGY REPORT*  Clinical Data: Chest pain, history of sickle cell disease.  PORTABLE CHEST - 1 VIEW  Comparison: 04/28/2012  Findings: Mild cardiac enlargement.  Normal lung volumes.  No pleural effusion or edema.  Chronic scarring is noted in both lungs and appears lower lobe predominant.  No superimposed airspace consolidation  identified.  IMPRESSION:  1.  No change from prior exam. 2.  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   Original Report Authenticated By: Signa Kell, M.D.     Review of Systems  Constitutional: Negative for fever, chills, weight loss, malaise/fatigue and diaphoresis.  HENT: Negative for ear pain, congestion and sore throat.   Eyes: Negative for blurred  vision, double vision, pain, discharge and redness.  Respiratory: Negative for cough, sputum production, shortness of breath and wheezing.   Cardiovascular: Positive for chest pain (described as right sided rib cage pain and shoulder pain). Negative for palpitations, orthopnea and PND.  Gastrointestinal: Negative for nausea, vomiting, abdominal pain and diarrhea.  Genitourinary: Negative for dysuria.  Musculoskeletal: Positive for joint pain (right shoulder and right anterior rib cage). Negative for myalgias.  Skin: Negative for itching and rash.  Neurological: Negative for dizziness, tingling, sensory change, speech change, focal weakness, weakness and headaches.  Endo/Heme/Allergies: Does not bruise/bleed easily.  Psychiatric/Behavioral: Negative for depression and substance abuse. The patient is not nervous/anxious.     Blood pressure 128/73, pulse 64, temperature 98.3 F (36.8 C), temperature source Oral, resp. rate 18, SpO2 95.00%. Physical Exam  Constitutional: He is oriented to person, place, and time. He appears well-developed and well-nourished. No distress.       Appears well. In NAD  HENT:  Head: Normocephalic and atraumatic.  Right Ear: External ear normal.  Left Ear: External ear normal.  Nose: Nose normal.  Mouth/Throat: Oropharynx is clear and moist. No oropharyngeal exudate.  Eyes: Conjunctivae normal are normal. Pupils are equal, round, and reactive to light. Right eye exhibits no discharge. Left eye exhibits no discharge. No scleral icterus.  Neck: Normal range of motion. Neck supple. No thyromegaly present.  Cardiovascular: Normal rate and regular rhythm.  Exam reveals no gallop and no friction rub.   No murmur heard. Respiratory: Effort normal and breath sounds normal. No respiratory distress. He has no wheezes. He has no rales. He exhibits tenderness (to right anterior rib cage laterally, not sternal).  GI: Soft. Bowel sounds are normal. He exhibits no distension.  There is no tenderness.  Genitourinary:       deferred  Musculoskeletal: Normal range of motion. Tenderness: right shoulder.  Lymphadenopathy:    He has no cervical adenopathy.  Neurological: He is alert and oriented to person, place, and time. No cranial nerve deficit. He exhibits normal muscle tone. Coordination normal.  Skin: Skin is warm and dry. No rash noted. He is not diaphoretic. No erythema.  Psychiatric: He has a normal mood and affect.     Assessment/Plan 1. Sickle cell anemia with pain-I think it is reasonable to admit to 23 hr OBS sickle cell unit. Dr. August Saucer to assume care in am. Continue home pain meds and add Dilaudid IV, Toradol IV, and hydrate well. CXR shows no sx indicative of acute chest syndrome and pt denies any sx of infection at home. Hgb is 8.5 and normally runs about 9-10. His electrolytes and kidney fx are normal. Bili is slightly elevated and is baseline when compared to past labs. Repeat labs in a.m.  2. Right rib cage pain-he doesn't describe this as sternal pain and seems to be related to the shoulder pain. He has no cold/flu/chest symptoms. EKG and CXR neg. Will follow. 3. Tobacco abuse-pt states he quit 3 mos ago, but conts to wear a nicotine patch for help. Will cont that while here.  4. Leukocytosis-no sx of infection, likely stress de  margination.   Dr. Adela Glimpse to review note and cosign. Admission time: 35 min.   Jimmye Norman 04/28/2012, 11:19 PM

## 2012-04-28 NOTE — ED Notes (Signed)
Called and spoke with Desiree at the [facility]le cell center.   Desiree reported that it would be a few more minutes before pt is transported.

## 2012-04-28 NOTE — ED Notes (Signed)
Pt reports sickle SSC pain for a few weeks. From "shoulder to bottom of ribs" on R side. Has been controlling at home, no longer effective.

## 2012-04-28 NOTE — ED Provider Notes (Addendum)
History     CSN: [id number]  Arrival date & time 04/28/12  1534   First MD Initiated Contact with Patient 04/28/12 1658      Chief Complaint  Patient presents with  . Sickle Cell Pain Crisis    (Consider location/radiation/quality/duration/timing/severity/associated sxs/prior treatment) HPI Complains of right shoulder pain and right-sided lateral chest pain, nonradiating onset 3 weeks ago typical of sickle cell crisis pain he's had in the past pain is pleuritic in nature. No fever no other associated symptoms. Himself with morphine at home, without adequate analgesia. Nothing makes symptoms better or worse pain is sharp, pleuritic and severe Past Medical History  Diagnosis Date  . Sickle cell crisis   . Blood transfusion   . Avascular necrosis of hip     bilateral  . Infection of bone, shoulder region     left shoulder  . Pneumonia   . Avascular necrosis of hip, left 08/27/2011    Past Surgical History  Procedure Date  . Orif right hip fracture 1995  . Joint replacement 2006    right total hip arthroplasty    No family history on file.  History  Substance Use Topics  . Smoking status: Former Smoker -- 0.5 packs/day    Types: Cigarettes    Quit date: 01/27/2012  . Smokeless tobacco: Never Used  . Alcohol Use: No      Review of Systems  Constitutional: Negative.   HENT: Negative.   Respiratory: Negative.   Cardiovascular: Positive for chest pain.  Gastrointestinal: Negative.   Musculoskeletal: Positive for arthralgias.       Right shoulder pain  Skin: Negative.   Neurological: Negative.   Hematological: Negative.   Psychiatric/Behavioral: Negative.   All other systems reviewed and are negative.    Allergies  Review of patient's allergies indicates no known allergies.  Home Medications   Current Outpatient Rx  Name  Route  Sig  Dispense  Refill  . DICLOFENAC SODIUM 1 % TD GEL   Topical   Apply 1 application topically 4 (four) times daily.         Marland Kitchen FOLIC ACID 1 MG PO TABS   Oral   Take 1 tablet (1 mg total) by mouth daily.         . MORPHINE SULFATE ER 60 MG PO TB12   Oral   Take 1 tablet (60 mg total) by mouth 2 (two) times daily.   40 tablet   0   . NICOTINE 14 MG/24HR TD PT24   Transdermal   Place 1 patch onto the skin daily.         Marland Kitchen PROMETHAZINE HCL 25 MG PO TABS   Oral   Take 1 tablet (25 mg total) by mouth every 6 (six) hours as needed.   60 tablet   0   . MORPHINE SULFATE 30 MG PO TABS   Oral   Take 1 tablet (30 mg total) by mouth every 4 (four) hours as needed for pain. Breakthrough pain   60 tablet   0     BP 134/78  Pulse 80  Temp 98.6 F (37 C) (Oral)  Resp 16  SpO2 96%  Physical Exam  Nursing note and vitals reviewed. Constitutional: He is oriented to person, place, and time. He appears well-developed and well-nourished.  HENT:  Head: Normocephalic and atraumatic.  Eyes: Conjunctivae normal are normal. Pupils are equal, round, and reactive to light.  Neck: Neck supple. No tracheal deviation present. No thyromegaly present.  Cardiovascular: Normal rate and regular rhythm.   No murmur heard. Pulmonary/Chest: Effort normal and breath sounds normal.  Abdominal: Soft. Bowel sounds are normal. He exhibits no distension. There is no tenderness.  Musculoskeletal: Normal range of motion. He exhibits no edema and no tenderness.  Neurological: He is alert and oriented to person, place, and time. Coordination normal.  Skin: Skin is warm and dry. No rash noted.  Psychiatric: He has a normal mood and affect.    ED Course  Procedures (including critical care time)  Date: 04/28/2012  Rate: 85  Rhythm: normal sinus rhythm  QRS Axis: normal  Intervals: normal  ST/T Wave abnormalities: normal  Conduction Disutrbances: none  Narrative Interpretation: unremarkable  No significant change from 11/07/2011 interpreted by me   Labs Reviewed  CBC WITH DIFFERENTIAL  COMPREHENSIVE METABOLIC PANEL   RETICULOCYTES   No results found. Results for orders placed during the hospital encounter of 04/28/12  CBC WITH DIFFERENTIAL      Component Value Range   WBC 13.6 (*) 4.0 - 10.5 K/uL   RBC 3.37 (*) 4.22 - 5.81 MIL/uL   Hemoglobin 8.5 (*) 13.0 - 17.0 g/dL   HCT 14.7 (*) 82.9 - 56.2 %   MCV 90.5  78.0 - 100.0 fL   MCH 25.2 (*) 26.0 - 34.0 pg   MCHC 27.9 (*) 30.0 - 36.0 g/dL   RDW 13.0  86.5 - 78.4 %   Platelets 239  150 - 400 K/uL   Neutrophils Relative 73  43 - 77 %   Lymphocytes Relative 13  12 - 46 %   Monocytes Relative 10  3 - 12 %   Eosinophils Relative 4  0 - 5 %   Basophils Relative 0  0 - 1 %   Neutro Abs 9.9 (*) 1.7 - 7.7 K/uL   Lymphs Abs 1.8  0.7 - 4.0 K/uL   Monocytes Absolute 1.4 (*) 0.1 - 1.0 K/uL   Eosinophils Absolute 0.5  0.0 - 0.7 K/uL   Basophils Absolute 0.0  0.0 - 0.1 K/uL   RBC Morphology TARGET CELLS     Smear Review PLATELET COUNT CONFIRMED BY SMEAR    COMPREHENSIVE METABOLIC PANEL      Component Value Range   Sodium 139  135 - 145 mEq/L   Potassium 3.9  3.5 - 5.1 mEq/L   Chloride 102  96 - 112 mEq/L   CO2 25  19 - 32 mEq/L   Glucose, Bld 86  70 - 99 mg/dL   BUN 14  6 - 23 mg/dL   Creatinine, Ser 6.96  0.50 - 1.35 mg/dL   Calcium 9.7  8.4 - 29.5 mg/dL   Total Protein 7.8  6.0 - 8.3 g/dL   Albumin 4.3  3.5 - 5.2 g/dL   AST 19  0 - 37 U/L   ALT 9  0 - 53 U/L   Alkaline Phosphatase 86  39 - 117 U/L   Total Bilirubin 2.5 (*) 0.3 - 1.2 mg/dL   GFR calc non Af Amer >90  >90 mL/min   GFR calc Af Amer >90  >90 mL/min  RETICULOCYTES      Component Value Range   Retic Ct Pct 4.0 (*) 0.4 - 3.1 %   RBC. 3.37 (*) 4.22 - 5.81 MIL/uL   Retic Count, Manual 134.8  19.0 - 186.0 K/uL   Dg Chest 2 View  04/28/2012  *RADIOLOGY REPORT*  Clinical Data: Sickle cell crisis  CHEST - 2 VIEW  Comparison: Chest radiograph 11/10/2011  Findings: Normal cardiac silhouette.  The ascending aorta is prominent.  There is chronic scarring within the lungs.  No focal  consolidation.  Trace pleural effusions.  IMPRESSION: 1.  No evidence of overt pulmonary edema or pneumonia. 2.   Chronic scarring of the lungs.  3.  Prominent ascending aorta.  This can be associated with aortic stenosis.   Original Report Authenticated By: Genevive Bi, M.D.     No diagnosis found. Results for orders placed during the hospital encounter of 04/28/12  CBC WITH DIFFERENTIAL      Component Value Range   WBC 13.6 (*) 4.0 - 10.5 K/uL   RBC 3.37 (*) 4.22 - 5.81 MIL/uL   Hemoglobin 8.5 (*) 13.0 - 17.0 g/dL   HCT 16.1 (*) 09.6 - 04.5 %   MCV 90.5  78.0 - 100.0 fL   MCH 25.2 (*) 26.0 - 34.0 pg   MCHC 27.9 (*) 30.0 - 36.0 g/dL   RDW 40.9  81.1 - 91.4 %   Platelets 239  150 - 400 K/uL   Neutrophils Relative 73  43 - 77 %   Lymphocytes Relative 13  12 - 46 %   Monocytes Relative 10  3 - 12 %   Eosinophils Relative 4  0 - 5 %   Basophils Relative 0  0 - 1 %   Neutro Abs 9.9 (*) 1.7 - 7.7 K/uL   Lymphs Abs 1.8  0.7 - 4.0 K/uL   Monocytes Absolute 1.4 (*) 0.1 - 1.0 K/uL   Eosinophils Absolute 0.5  0.0 - 0.7 K/uL   Basophils Absolute 0.0  0.0 - 0.1 K/uL   RBC Morphology TARGET CELLS     Smear Review PLATELET COUNT CONFIRMED BY SMEAR    COMPREHENSIVE METABOLIC PANEL      Component Value Range   Sodium 139  135 - 145 mEq/L   Potassium 3.9  3.5 - 5.1 mEq/L   Chloride 102  96 - 112 mEq/L   CO2 25  19 - 32 mEq/L   Glucose, Bld 86  70 - 99 mg/dL   BUN 14  6 - 23 mg/dL   Creatinine, Ser 7.82  0.50 - 1.35 mg/dL   Calcium 9.7  8.4 - 95.6 mg/dL   Total Protein 7.8  6.0 - 8.3 g/dL   Albumin 4.3  3.5 - 5.2 g/dL   AST 19  0 - 37 U/L   ALT 9  0 - 53 U/L   Alkaline Phosphatase 86  39 - 117 U/L   Total Bilirubin 2.5 (*) 0.3 - 1.2 mg/dL   GFR calc non Af Amer >90  >90 mL/min   GFR calc Af Amer >90  >90 mL/min  RETICULOCYTES      Component Value Range   Retic Ct Pct 4.0 (*) 0.4 - 3.1 %   RBC. 3.37 (*) 4.22 - 5.81 MIL/uL   Retic Count, Manual 134.8  19.0 - 186.0 K/uL   Dg Chest  2 View  04/28/2012  *RADIOLOGY REPORT*  Clinical Data: Sickle cell crisis  CHEST - 2 VIEW  Comparison: Chest radiograph 11/10/2011  Findings: Normal cardiac silhouette.  The ascending aorta is prominent.  There is chronic scarring within the lungs.  No focal consolidation.  Trace pleural effusions.  IMPRESSION: 1.  No evidence of overt pulmonary edema or pneumonia. 2.   Chronic scarring of the lungs.  3.  Prominent ascending aorta.  This can be associated with aortic stenosis.  Original Report Authenticated By: Genevive Bi, M.D.    Dg Chest Port 1 View  04/28/2012  *RADIOLOGY REPORT*  Clinical Data: Chest pain, history of sickle cell disease.  PORTABLE CHEST - 1 VIEW  Comparison: 04/28/2012  Findings: Mild cardiac enlargement.  Normal lung volumes.  No pleural effusion or edema.  Chronic scarring is noted in both lungs and appears lower lobe predominant.  No superimposed airspace consolidation identified.  IMPRESSION:  1.  No change from prior exam. 2.  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   Original Report Authenticated By: Signa Kell, M.D.     8:10 PM pain improved after several doses of hydromorphone MDM  Spoke with Dr.Doutova will arrange for  transfer to  sickle cell unit Diagnosis acute sickle cell crisis      Doug Sou, MD 04/28/12 2022  Doug Sou, MD 04/29/12 4540

## 2012-04-29 ENCOUNTER — Encounter (HOSPITAL_COMMUNITY): Payer: Self-pay | Admitting: *Deleted

## 2012-04-29 ENCOUNTER — Telehealth (HOSPITAL_COMMUNITY): Payer: Self-pay

## 2012-04-29 LAB — CBC
HCT: 27.9 % — ABNORMAL LOW (ref 39.0–52.0)
Hemoglobin: 10.3 g/dL — ABNORMAL LOW (ref 13.0–17.0)
MCH: 33.3 pg (ref 26.0–34.0)
RBC: 3.09 MIL/uL — ABNORMAL LOW (ref 4.22–5.81)

## 2012-04-29 LAB — BASIC METABOLIC PANEL
CO2: 26 mEq/L (ref 19–32)
Chloride: 101 mEq/L (ref 96–112)
GFR calc non Af Amer: >90 mL/min (ref >90)
Glucose, Bld: 99 mg/dL (ref 70–99)
Potassium: 3.5 mEq/L (ref 3.5–5.1)
Sodium: 136 mEq/L (ref 135–145)

## 2012-04-29 MED ORDER — DEXTROSE-NACL 5-0.45 % IV SOLN
INTRAVENOUS | Status: DC
  Administered 2012-04-29: 125 mL/h via INTRAVENOUS
  Administered 2012-04-30: 14:00:00 via INTRAVENOUS
  Administered 2012-04-30 – 2012-05-01 (×4): 125 mL/h via INTRAVENOUS
  Administered 2012-05-02: 1000 mL via INTRAVENOUS
  Administered 2012-05-02: 125 mL/h via INTRAVENOUS
  Administered 2012-05-02: 1000 mL via INTRAVENOUS

## 2012-04-29 MED ORDER — DIPHENHYDRAMINE HCL 50 MG/ML IJ SOLN
12.5000 mg | Freq: Four times a day (QID) | INTRAMUSCULAR | Status: DC | PRN
  Administered 2012-04-29: 12.5 mg via INTRAVENOUS
  Filled 2012-04-29: qty 1

## 2012-04-29 MED ORDER — MORPHINE SULFATE ER 30 MG PO TBCR
60.0000 mg | EXTENDED_RELEASE_TABLET | Freq: Two times a day (BID) | ORAL | Status: DC
  Administered 2012-04-30 – 2012-05-04 (×9): 60 mg via ORAL
  Filled 2012-04-29: qty 1
  Filled 2012-04-29 (×4): qty 2
  Filled 2012-04-29: qty 1
  Filled 2012-04-29 (×4): qty 2

## 2012-04-29 MED ORDER — HYDROMORPHONE HCL PF 2 MG/ML IJ SOLN
2.0000 mg | INTRAMUSCULAR | Status: DC | PRN
  Administered 2012-04-30 – 2012-05-02 (×24): 2 mg via INTRAVENOUS
  Filled 2012-04-29 (×5): qty 1
  Filled 2012-04-29: qty 2
  Filled 2012-04-29 (×18): qty 1

## 2012-04-29 MED ORDER — DICLOFENAC SODIUM 1 % TD GEL
2.0000 g | Freq: Four times a day (QID) | TRANSDERMAL | Status: DC
  Administered 2012-04-30 – 2012-05-04 (×13): 2 g via TOPICAL
  Filled 2012-04-29: qty 100

## 2012-04-29 MED ORDER — MORPHINE SULFATE 30 MG PO TABS: 30.0000 mg | ORAL_TABLET | ORAL | Status: DC | PRN

## 2012-04-29 MED ORDER — DIPHENHYDRAMINE HCL 50 MG/ML IJ SOLN
12.5000 mg | Freq: Four times a day (QID) | INTRAMUSCULAR | Status: DC | PRN
  Administered 2012-05-02 – 2012-05-04 (×5): 12.5 mg via INTRAVENOUS
  Filled 2012-04-29 (×5): qty 1

## 2012-04-29 MED ORDER — KETOROLAC TROMETHAMINE 30 MG/ML IJ SOLN
30.0000 mg | Freq: Three times a day (TID) | INTRAMUSCULAR | Status: DC | PRN
  Filled 2012-04-29: qty 1

## 2012-04-29 MED ORDER — PROMETHAZINE HCL 25 MG/ML IJ SOLN
12.5000 mg | Freq: Four times a day (QID) | INTRAMUSCULAR | Status: DC | PRN
  Administered 2012-04-30 – 2012-05-03 (×8): 25 mg via INTRAVENOUS
  Filled 2012-04-29 (×8): qty 1

## 2012-04-29 MED ORDER — NICOTINE 14 MG/24HR TD PT24
14.0000 mg | MEDICATED_PATCH | Freq: Every day | TRANSDERMAL | Status: DC
  Filled 2012-04-29 (×2): qty 1

## 2012-04-29 MED ORDER — FOLIC ACID 1 MG PO TABS
1.0000 mg | ORAL_TABLET | Freq: Every day | ORAL | Status: DC
  Administered 2012-04-30 – 2012-05-04 (×5): 1 mg via ORAL
  Filled 2012-04-29 (×5): qty 1

## 2012-04-29 MED ORDER — ACETAMINOPHEN 325 MG PO TABS: 650.0000 mg | ORAL_TABLET | Freq: Four times a day (QID) | ORAL | Status: DC | PRN

## 2012-04-29 NOTE — Discharge Summary (Signed)
Physician Discharge Summary  QUANTAE MARTEL OZH:[id number] DOB: 1975-05-27 DOA: 04/28/2012  PCP: Dorrene German, MD  Admit date: 04/28/2012 Discharge date: 04/29/2012  Time spent: 30 minutes  Recommendations for Outpatient Follow-up:  1. Tranfer to inpatient status with sickle cell crisis pain not yet resolved. 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 #3 Chronic anemia.  #4 tobacco abuse. Continues on nicotine patch. #5 leukocytosis. No sign of infection likely stress.    Discharge Condition: Stable for transfer  Diet recommendation: Continue hospital diet  There were no vitals filed for this visit.  History of present illness: 37 year old male admitted to sickle cell Center for 23 hour observation. Complained of right shoulder and anterolateral rib cage pain unrelieved by home pain medication. He denied any cold-like symptoms, nausea vomiting, denies diarrhea, abdominal pain, left-sided chest pain, shortness of breath, fever, sputum production.   Hospital Course:  Patient to was admitted for IV analgesics, IV hydration over 23 hours. There was not adequate relief of acute pain to allow for discharge following 23 hours of treatment and observation status. The decision was made to discharge the patient from observation and a readmit as an inpatient for further IV hydration and IV narcotic pain management. Procedures:  None (i.e. Studies not automatically included, echos, thoracentesis, etc; not x-rays)  Consultations:  None  Discharge Exam: Filed Vitals:   04/29/12 0649 04/29/12 0924 04/29/12 1347 04/29/12 1809  BP: 118/64 110/68 118/66 123/63  Pulse: 66 79 75 82  Temp: 98.3 F (36.8 C) 97.6 F (36.4 C) 98.2 F (36.8 C) 98.4 F (36.9 C)  TempSrc: Oral Oral Oral Oral  Resp: 18 18 20 20     SpO2: 93% 97% 97% 95%    General: Well-developed middle-aged male in no distress. Cardiovascular: Regular rate and rhythm. No jugular venous distention or edema. Respiratory: Breath sounds clear and equal. No distress or cough. Abdomen. Soft and positive bowel sounds. No pain. Extremities. Moves all 4 independently. Strengths equal x4. Neurologic. Cranial nerves 2-12 grossly intact. No unilateral or focal defects.  Discharge Instructions     Medication List     As of 04/29/2012  8:50 PM    TAKE these medications         diclofenac sodium 1 % Gel   Commonly known as: VOLTAREN   Apply 1 application topically 4 (four) times daily.      folic acid 1 MG tablet   Commonly known as: FOLVITE   Take 1 tablet (1 mg total) by mouth daily.      morphine 60 MG 12 hr tablet   Commonly known as: MS CONTIN   Take 1 tablet (60 mg total) by mouth 2 (two) times daily.      morphine 30 MG tablet   Commonly known as: MSIR   Take 1 tablet (30 mg total) by mouth every 4 (four) hours as needed for pain. Breakthrough pain      nicotine 14 mg/24hr patch   Commonly known as: NICODERM CQ - dosed in mg/24 hours   Place 1 patch onto the skin daily.      promethazine 25 MG tablet   Commonly known as: PHENERGAN   Take 1 tablet (25 mg total) by mouth every 6 (six) hours as needed.          The results of significant diagnostics from this  hospitalization (including imaging, microbiology, ancillary and laboratory) are listed below for reference.    Significant Diagnostic Studies: Dg Chest 2 View  04/28/2012  *RADIOLOGY REPORT*  Clinical Data: Sickle cell crisis  CHEST - 2 VIEW  Comparison: Chest radiograph 11/10/2011  Findings: Normal cardiac silhouette.  The ascending aorta is prominent.  There is chronic scarring within the lungs.  No focal consolidation.  Trace pleural effusions.  IMPRESSION: 1.  No evidence of overt pulmonary edema or pneumonia. 2.   Chronic scarring of the lungs.  3.  Prominent  ascending aorta.  This can be associated with aortic stenosis.   Original Report Authenticated By: Genevive Bi, M.D.    Dg Chest Port 1 View  04/28/2012  *RADIOLOGY REPORT*  Clinical Data: Chest pain, history of sickle cell disease.  PORTABLE CHEST - 1 VIEW  Comparison: 04/28/2012  Findings: Mild cardiac enlargement.  Normal lung volumes.  No pleural effusion or edema.  Chronic scarring is noted in both lungs and appears lower lobe predominant.  No superimposed airspace consolidation identified.  IMPRESSION:  1.  No change from prior exam. 2.  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   Original Report Authenticated By: Signa Kell, M.D.     Microbiology: No results found for this or any previous visit (from the past 240 hour(s)).   Labs: Basic Metabolic Panel:  Lab 04/29/12 1914 04/28/12 1640  NA 136 139  K 3.5 3.9  CL 101 102  CO2 26 25  GLUCOSE 99 86  BUN 8 14  CREATININE 0.72 0.73  CALCIUM 9.0 9.7  MG -- --  PHOS -- --   Liver Function Tests:  Lab 04/28/12 1640  AST 19  ALT 9  ALKPHOS 86  BILITOT 2.5*  PROT 7.8  ALBUMIN 4.3   No results found for this basename: LIPASE:5,AMYLASE:5 in the last 168 hours No results found for this basename: AMMONIA:5 in the last 168 hours CBC:  Lab 04/29/12 0745 04/28/12 1640  WBC 9.9 13.6*  NEUTROABS -- 9.9*  HGB 10.3* 8.5*  HCT 27.9* 30.5*  MCV 90.3 90.5  PLT 246 239   Cardiac Enzymes: No results found for this basename: CKTOTAL:5,CKMB:5,CKMBINDEX:5,TROPONINI:5 in the last 168 hours BNP: BNP (last 3 results)  Basename 11/09/11 0452 08/26/11 0542 08/25/11 0655  PROBNP 401.2* 144.3* 199.1*   CBG: No results found for this basename: GLUCAP:5 in the last 168 hours     Signed:  Trisha Mangle  [facility] 04/29/2012, 8:50 PM

## 2012-04-29 NOTE — Progress Notes (Signed)
SICKLE CELL  PROGRESS NOTE  [name] ZOX:[id number] DOB: [birth year] DOA: 04/28/2012 PCP: Dorrene German, MD  SUBJECTIVE: [name] was seen today in the [facility] unit today  lying in the bed rocking self back and forth remains in moderate distress pain rated 8/10 some relief with IV hydromorphone. Shoulders remain painful left>right increase with resistance no nocual rigidity. Hx of AVN.    Consultants:  None    Objective: Filed Vitals:   04/28/12 2315 04/29/12 0649 04/29/12 0924 04/29/12 1347  BP: 128/73 118/64 110/68 118/66  Pulse: 64 66 79 75  Temp: 98.3 F (36.8 C) 98.3 F (36.8 C) 97.6 F (36.4 C) 98.2 F (36.8 C)  TempSrc: Oral Oral Oral Oral  Resp: 18 18 18 20   SpO2: 95% 93% 97% 97%   No intake or output data in the 24 hours ending 04/29/12 1348  Exam: Gen:  NAD Cardiovascular:  Regular Rhythm Rate, No Mummur /Gallops  Respiratory: Lungs Clear to auscultation  Gastrointestinal: Abdomen soft, Non tender distended  with normal active bowel sounds.( note BM on yesterday) GU: denies priapism   SKIN: Warm and dry  Extremities: No edema negative homans sign increase pain elicit on resistance   Data Reviewed: COMPREHENSIVE METABOLIC PANEL:  Lab 04/29/12 8119 04/28/12 1640  NA 136 139  K 3.5 3.9  CL 101 102  CO2 26 25  GLUCOSE 99 86  BUN 8 14  CREATININE 0.72 0.73  CALCIUM 9.0 9.7  MG -- --  PHOS -- --  AST -- 19  ALT -- 9  ALKPHOS -- 86  BILITOT -- 2.5*  PROT -- 7.8  ALBUMIN -- 4.3    GFR Coagulation profile No results found for this basename: INR:5,PROTIME:5 in the last 168 hours  CBC:  Lab 04/29/12 0745 04/28/12 1640  WBC 9.9 13.6*  NEUTROABS -- 9.9*  HGB 10.3* 8.5*  HCT 27.9* 30.5*  MCV 90.3 90.5  PLT 246 239  Sickle cell/Anemia work up  [employer] 04/28/12 1640  FOLATE --  FERRITIN --  TIBC --  IRON --  RETICCTPCT 4.0*    Studies: Dg Chest 2 View  04/28/2012  *RADIOLOGY REPORT*  Clinical Data: Sickle cell crisis  CHEST - 2 VIEW   Comparison: Chest radiograph 11/10/2011  Findings: Normal cardiac silhouette.  The ascending aorta is prominent.  There is chronic scarring within the lungs.  No focal consolidation.  Trace pleural effusions.  IMPRESSION: 1.  No evidence of overt pulmonary edema or pneumonia. 2.   Chronic scarring of the lungs.  3.  Prominent ascending aorta.  This can be associated with aortic stenosis.   Original Report Authenticated By: Genevive Bi, M.D.    Dg Chest Port 1 View  04/28/2012  *RADIOLOGY REPORT*  Clinical Data: Chest pain, history of sickle cell disease.  PORTABLE CHEST - 1 VIEW  Comparison: 04/28/2012  Findings: Mild cardiac enlargement.  Normal lung volumes.  No pleural effusion or edema.  Chronic scarring is noted in both lungs and appears lower lobe predominant.  No superimposed airspace consolidation identified.  IMPRESSION:  1.  No change from prior exam. 2.  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   Original Report Authenticated By: Signa Kell, M.D.     Scheduled Meds:   . diclofenac sodium  2 g Topical QID  . folic acid  1 mg Oral Daily  . morphine  60 mg Oral BID  . nicotine  14 mg Transdermal Daily   Continuous Infusions:   .  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 Dion Saucier  Tobacco abuse: doesn't need intervention at this time   Disposition Plan:  Discharge vs/ inpatient service    Gwinda Passe, NP-C  Sickle Cell Internal Medicine Pager [phone].

## 2012-04-29 NOTE — Progress Notes (Signed)
Patient ID: [name], male   DOB: 09/03/1975, 37 y.o.   MRN: [id number] Report given to [facility] on [facility]. Transfer orders in the system by Benedetto Coons NP. Patient VSS, NAD, and all belongings with patient. Patient transported to [facility] room# 1301 via wheelchair.

## 2012-04-29 NOTE — H&P (Signed)
PCP:   Dorrene German, MD   Chief Complaint:  Sickle cell crisis pain  HPI: This 44 her old male was admitted to the sickle cell Center for 23 hour observation and treatment with IV fluids and IV analgesics for a sickle cell crisis. Despite the above mentioned treatment the patient's pain is not adequately controlled to allow him to return home. Because of this patient will be discharged from the sickle cell Center and readmitted as an inpatient at [facility].  Review of Systems:  The patient denies anorexia, fever, weight loss,, vision loss, decreased hearing, hoarseness, chest pain, syncope, dyspnea on exertion, peripheral edema, balance deficits, hemoptysis, abdominal pain, melena, hematochezia, severe indigestion/heartburn, hematuria, incontinence, genital sores, muscle weakness, suspicious skin lesions, transient blindness, difficulty walking, depression, unusual weight change, abnormal bleeding, enlarged lymph nodes, angioedema, and breast masses.  Past Medical History: Past Medical History  Diagnosis Date  . Sickle cell crisis   . Blood transfusion   . Avascular necrosis of hip     bilateral  . Infection of bone, shoulder region     left shoulder  . Pneumonia   . Avascular necrosis of hip, left 08/27/2011   Past Surgical History  Procedure Date  . Orif right hip fracture 1995  . Joint replacement 2006    right total hip arthroplasty    Medications: Prior to Admission medications   Medication Sig Start Date End Date Taking? Authorizing Provider  diclofenac sodium (VOLTAREN) 1 % GEL Apply 1 application topically 4 (four) times daily. 08/27/11  Yes Keitha Butte, NP  folic acid (FOLVITE) 1 MG tablet Take 1 tablet (1 mg total) by mouth daily. 05/22/11 05/21/12 Yes Keitha Butte, NP  morphine (MS CONTIN) 60 MG 12 hr tablet Take 1 tablet (60 mg total) by mouth 2 (two) times daily. 11/20/11  Yes Grayce Sessions, NP  morphine (MSIR) 30 MG tablet Take 1 tablet  (30 mg total) by mouth every 4 (four) hours as needed for pain. Breakthrough pain 11/20/11  Yes Grayce Sessions, NP  nicotine (NICODERM CQ - DOSED IN MG/24 HOURS) 14 mg/24hr patch Place 1 patch onto the skin daily.   Yes Historical Provider, MD  promethazine (PHENERGAN) 25 MG tablet Take 1 tablet (25 mg total) by mouth every 6 (six) hours as needed. 11/20/11  Yes Grayce Sessions, NP    Allergies:  No Known Allergies  Social History:  reports that he quit smoking about 3 months ago. His smoking use included Cigarettes. He smoked .5 packs per day. He has never used smokeless tobacco. He reports that he does not drink alcohol or use illicit drugs.  Family History: No family history on file.  Physical Exam: Filed Vitals:   04/29/12 0649 04/29/12 0924 04/29/12 1347 04/29/12 1809  BP: 118/64 110/68 118/66 123/63  Pulse: 66 79 75 82  Temp: 98.3 F (36.8 C) 97.6 F (36.4 C) 98.2 F (36.8 C) 98.4 F (36.9 C)  TempSrc: Oral Oral Oral Oral  Resp: 18 18 20 20   SpO2: 93% 97% 97% 95%   Physical exam. Vital signs. Temperature 98.4, pulse 82, respiration 20, blood pressure 123/63. O2 sats 95%. General appearance. Well-developed middle-aged male who is alert, cooperative and in no distress. Cardiac. Rate and rhythm regular without murmur, S3, S4. No jugular venous distention or edema. Lungs. Breath sounds clear and equal. No distress or cough. Abdomen. Soft with positive bowel sounds. No pain with palpation. Musculoskeletal. Muscle strength is 5/5 and equal in  all 4 extremity is. Decreased range of motion at the right shoulder secondary to pain. Decreased range of motion at the hips bilaterally due to pain. Neurologic. Cranial nerves 2-12 grossly intact. No unilateral or focal defects.   Labs on Admission:   [facility] 04/29/12 0745 04/28/12 1640  NA 136 139  K 3.5 3.9  CL 101 102  CO2 26 25  GLUCOSE 99 86  BUN 8 14  CREATININE 0.72 0.73  CALCIUM 9.0 9.7  MG -- --  PHOS -- --     Basename 04/28/12 1640  AST 19  ALT 9  ALKPHOS 86  BILITOT 2.5*  PROT 7.8  ALBUMIN 4.3   No results found for this basename: LIPASE:2,AMYLASE:2 in the last 72 hours  Basename 04/29/12 0745 04/28/12 1640  WBC 9.9 13.6*  NEUTROABS -- 9.9*  HGB 10.3* 8.5*  HCT 27.9* 30.5*  MCV 90.3 90.5  PLT 246 239   No results found for this basename: CKTOTAL:3,CKMB:3,CKMBINDEX:3,TROPONINI:3 in the last 72 hours No results found for this basename: TSH,T4TOTAL,FREET3,T3FREE,THYROIDAB in the last 72 hours  Basename 04/28/12 1640  VITAMINB12 --  FOLATE --  FERRITIN --  TIBC --  IRON --  RETICCTPCT 4.0*    Radiological Exams on Admission: Dg Chest 2 View  04/28/2012  *RADIOLOGY REPORT*  Clinical Data: Sickle cell crisis  CHEST - 2 VIEW  Comparison: Chest radiograph 11/10/2011  Findings: Normal cardiac silhouette.  The ascending aorta is prominent.  There is chronic scarring within the lungs.  No focal consolidation.  Trace pleural effusions.  IMPRESSION: 1.  No evidence of overt pulmonary edema or pneumonia. 2.   Chronic scarring of the lungs.  3.  Prominent ascending aorta.  This can be associated with aortic stenosis.   Original Report Authenticated By: Genevive Bi, M.D.    Dg Chest Port 1 View  04/28/2012  *RADIOLOGY REPORT*  Clinical Data: Chest pain, history of sickle cell disease.  PORTABLE CHEST - 1 VIEW  Comparison: 04/28/2012  Findings: Mild cardiac enlargement.  Normal lung volumes.  No pleural effusion or edema.  Chronic scarring is noted in both lungs and appears lower lobe predominant.  No superimposed airspace consolidation identified.  IMPRESSION:  1.  No change from prior exam. 2.  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   Original Report Authenticated By: Signa Kell, M.D.     Assessment/Plan Problem #1 vaso-occlusive crisis. Transfer to general medical room for continued repletion with IV fluids. Treat with IV pain medication, antiasthmatic and  anti-puretic medications. Problem #2 anemia. Secondary to sickle cell anemia. Last hemoglobin declined from 10.3 to current 8.5. We'll recheck in a.m. Problem #3. Avascular necrosis. The main source of current pain. This is followed by orthopedics Dr. Dion Saucier. Problem #4. Tobacco abuse. Patient continues on a nicotine patch.   Everett Graff M 04/29/2012, 9:15 PM

## 2012-04-29 NOTE — Progress Notes (Signed)
VSS remain stable throughout shift, patient still with pain to right shoulder and flank area, IV pain medication with minimal improvement throughout shift, patient c/o nausea serveral times, but no emesis noted, phenergan given per order.  Patient resting quietly at this time, in no acute distress.  RN will continue to monitor.

## 2012-04-30 LAB — CBC
HCT: 26.8 % — ABNORMAL LOW (ref 39.0–52.0)
Hemoglobin: 9.8 g/dL — ABNORMAL LOW (ref 13.0–17.0)
MCHC: 36.6 g/dL — ABNORMAL HIGH (ref 30.0–36.0)
MCV: 90.2 fL (ref 78.0–100.0)
RDW: 15.2 % (ref 11.5–15.5)

## 2012-04-30 LAB — BASIC METABOLIC PANEL
BUN: 5 mg/dL — ABNORMAL LOW (ref 6–23)
Chloride: 104 mEq/L (ref 96–112)
Creatinine, Ser: 0.71 mg/dL (ref 0.50–1.35)
GFR calc Af Amer: >90 mL/min (ref >90)
GFR calc non Af Amer: >90 mL/min (ref >90)
Glucose, Bld: 90 mg/dL (ref 70–99)
Potassium: 4.1 mEq/L (ref 3.5–5.1)

## 2012-04-30 MED ORDER — NICOTINE 14 MG/24HR TD PT24
14.0000 mg | MEDICATED_PATCH | TRANSDERMAL | Status: DC
  Administered 2012-04-30 – 2012-05-04 (×5): 14 mg via TRANSDERMAL
  Filled 2012-04-30 (×5): qty 1

## 2012-04-30 NOTE — Discharge Summary (Signed)
Patient have been seen and examined agree with above.  [name] 3:58 AM

## 2012-04-30 NOTE — H&P (Signed)
Patient have been seen and examined sill having some pain. Agree with admit to inpatient unit.  [name] 3:59 AM

## 2012-05-01 MED ORDER — SORBITOL 70 % SOLN
10.0000 mL | Freq: Every day | Status: DC | PRN
  Administered 2012-05-02 – 2012-05-04 (×2): 10 mL via ORAL
  Filled 2012-05-01 (×2): qty 30

## 2012-05-01 MED ORDER — ENOXAPARIN SODIUM 40 MG/0.4ML ~~LOC~~ SOLN
40.0000 mg | SUBCUTANEOUS | Status: DC
  Administered 2012-05-01 – 2012-05-03 (×2): 40 mg via SUBCUTANEOUS
  Filled 2012-05-01 (×4): qty 0.4

## 2012-05-01 MED ORDER — HYDROXYUREA 500 MG PO CAPS
1000.0000 mg | ORAL_CAPSULE | Freq: Every day | ORAL | Status: DC
  Administered 2012-05-01 – 2012-05-04 (×4): 1000 mg via ORAL
  Filled 2012-05-01 (×4): qty 2

## 2012-05-01 NOTE — Progress Notes (Signed)
Subjective: A 37 year old patient with sickle cell disease that was admitted with sickle cell pain crisis on 04/29/2012. Patient is complaining of continued pain involving his limbs as well as his chest. He denies shortness of breath no cough no nausea vomiting. He had constipation but currently responded to laxatives.  Objective: Vital signs in last 24 hours: Temp:  [97.7 F (36.5 C)-98.8 F (37.1 C)] 97.7 F (36.5 C) (01/05 1000) Pulse Rate:  [65-88] 65  (01/05 1000) Resp:  [16-18] 16  (01/05 1000) BP: (117-133)/(71-83) 127/79 mmHg (01/05 1000) SpO2:  [94 %-97 %] 97 % (01/05 1000) Weight change:  Last BM Date: 04/28/12  Intake/Output from previous day: 01/04 0701 - 01/05 0700 In: 3597.9 [P.O.:1200; I.V.:2397.9] Out: 6500 [Urine:6500] Intake/Output this shift: Total I/O In: 480 [P.O.:480] Out: 1000 [Urine:1000]  General appearance: alert, cooperative, appears stated age and no distress Head: Normocephalic, without obvious abnormality, atraumatic Eyes: conjunctivae/corneas clear. PERRL, EOM's intact. Fundi benign. Neck: no adenopathy, no carotid bruit, no JVD, supple, symmetrical, trachea midline and thyroid not enlarged, symmetric, no tenderness/mass/nodules Back: symmetric, no curvature. ROM normal. No CVA tenderness. Resp: clear to auscultation bilaterally Chest wall: no tenderness Cardio: regular rate and rhythm, S1, S2 normal, no murmur, click, rub or gallop Extremities: extremities normal, atraumatic, no cyanosis or edema Pulses: 2+ and symmetric Skin: Skin color, texture, turgor normal. No rashes or lesions Neurologic: Alert and oriented X 3, normal strength and tone. Normal symmetric reflexes. Normal coordination and gait  Lab Results:  Basename 04/30/12 0405 04/29/12 0745  WBC 9.0 9.9  HGB 9.8* 10.3*  HCT 26.8* 27.9*  PLT 224 246   BMET  Basename 04/30/12 0405 04/29/12 0745  NA 139 136  K 4.1 3.5  CL 104 101  CO2 26 26  GLUCOSE 90 99  BUN 5* 8    CREATININE 0.71 0.72  CALCIUM 9.0 9.0    Studies/Results: No results found.  Medications: I have reviewed the patient's current medications.  Assessment/Plan: 37 year old man with sickle cell pain crisis.  #1 sickle cell pain crisis: Patient is responding to be loaded IV. Per patient he has not felt any difference compared to yesterday. He however is not getting worse. He is on oral MS Contin MSIR Dilaudid and Toradol. We'll continue pain control per protocol. Patient is not on hydroxyurea. I will start him on that today.  #2 constipation: Continue laxatives. He symptoms responded to Dulcolax so we'll keep him on that.  #3 sickle cell anemia: H&H seems stable at this point. Continue to monitor.  #4 prophylaxis: Patient will be placed on Lovenox for DVT prophylaxis.  LOS: 3 days   GARBA,[name] 05/01/2012, 1:27 PM

## 2012-05-02 DIAGNOSIS — D72829 Elevated white blood cell count, unspecified: Secondary | ICD-10-CM | POA: Diagnosis not present

## 2012-05-02 DIAGNOSIS — E876 Hypokalemia: Secondary | ICD-10-CM | POA: Diagnosis not present

## 2012-05-02 LAB — BASIC METABOLIC PANEL
BUN: 4 mg/dL — ABNORMAL LOW (ref 6–23)
Calcium: 9 mg/dL (ref 8.4–10.5)
GFR calc Af Amer: >90 mL/min (ref >90)
GFR calc non Af Amer: >90 mL/min (ref >90)
Glucose, Bld: 102 mg/dL — ABNORMAL HIGH (ref 70–99)
Potassium: 3.3 mEq/L — ABNORMAL LOW (ref 3.5–5.1)

## 2012-05-02 LAB — CBC
HCT: 26.7 % — ABNORMAL LOW (ref 39.0–52.0)
Hemoglobin: 9.8 g/dL — ABNORMAL LOW (ref 13.0–17.0)
MCH: 32.9 pg (ref 26.0–34.0)
MCHC: 36.7 g/dL — ABNORMAL HIGH (ref 30.0–36.0)
RDW: 15.4 % (ref 11.5–15.5)

## 2012-05-02 LAB — HEPATIC FUNCTION PANEL
Bilirubin, Direct: 0.3 mg/dL (ref 0.0–0.3)
Indirect Bilirubin: 2.1 mg/dL — ABNORMAL HIGH (ref 0.3–0.9)
Total Bilirubin: 2.4 mg/dL — ABNORMAL HIGH (ref 0.3–1.2)

## 2012-05-02 MED ORDER — POTASSIUM CHLORIDE CRYS ER 20 MEQ PO TBCR
40.0000 meq | EXTENDED_RELEASE_TABLET | ORAL | Status: AC
  Administered 2012-05-02 (×2): 40 meq via ORAL
  Filled 2012-05-02 (×2): qty 2

## 2012-05-02 MED ORDER — HYDROMORPHONE HCL PF 2 MG/ML IJ SOLN
4.0000 mg | INTRAMUSCULAR | Status: DC
  Administered 2012-05-02 – 2012-05-03 (×9): 4 mg via INTRAVENOUS
  Filled 2012-05-02 (×9): qty 2

## 2012-05-02 NOTE — Progress Notes (Signed)
SICKLE CELL SERVICE PROGRESS NOTE  [name] OZD:[id number] DOB: 1975/11/14 DOA: 04/28/2012 PCP: Dorrene German, MD  Assessment/Plan:  1. Sickle Cell Anemia With Crisis: will change medication to scheduled bolus dilaudid 4 mg IV q 2 hours. I anticipate that this will decrease pain to the level of transition to oral medications within 48 hours. Continue Toradol. Decrease IVF as patient is fully hydrated. Continue Hydrea and Folic acid. 2. Hypokalemia: Pt with mild hypokalemia which will be orally replaced. 3. Tobacco Use Disorder: Pt will continue on Nicoderm patch.  Code Status: Full Code Family Communication: N/A Disposition Plan: Home at time of discharge.  [name] A.  [facility] Pager [phone]. If 7PM-7AM, please contact night-coverage. 05/02/2012, 5:14 PM  LOS: 4 days   Brief narrative: PT with [facility] [name] who was initially seen in the day center for acute VOC. However, pain was still not controlled and he was admitted to in-patient service for more aggressive pain management.  Consultants:  None  Procedures:  None  Antibiotics:  None  HPI/Subjective: Pt states that pain is localized to back and ribs. He rates the pain as 9/10 which decreases to 8/10 with bolus medications and relief last for approximately 2 hours. He denies any nausea or emesis.  Objective: Filed Vitals:   05/02/12 0140 05/02/12 0555 05/02/12 1029 05/02/12 1342  BP: 134/77 118/68 144/79 134/85  Pulse: 67 61 59 69  Temp: 97.7 F (36.5 C) 98.2 F (36.8 C) 98.1 F (36.7 C) 97.5 F (36.4 C)  TempSrc: Oral Oral Oral Oral  Resp: 18 19 18 18   Height:      Weight:      SpO2: 94% 95% 99% 97%   Weight change:   Intake/Output Summary (Last 24 hours) at 05/02/12 1714 Last data filed at 05/02/12 1300  Gross per 24 hour  Intake   1440 ml  Output   3700 ml  Net  -2260 ml    General: Alert, awake, oriented x3, in no acute distress. Very pleasant demeanor.  HEENT: [place]/AT PEERL, EOMI.  Mild icterus Neck: Trachea midline,  no masses, no thyromegal,y no JVD, no carotid bruit OROPHARYNX:  Moist, No exudate/ erythema/lesions.  Heart: Regular rate and rhythm, without murmurs, rubs, gallops.  Lungs: Clear to auscultation, no wheezing or rhonchi noted.   Abdomen: Soft, nontender, nondistended, positive bowel sounds, no masses no hepatosplenomegaly noted. Neuro: No focal neurological deficits noted cranial nerves II through XII grossly intact. Strength normal in bilateral upper and lower extremities. Musculoskeletal: No warm swelling or erythema around joints. Psychiatric: Patient alert and oriented x3, good insight and cognition, good recent to remote recall.    Data Reviewed: Basic Metabolic Panel:  Lab 05/02/12 7564 04/30/12 0405 04/29/12 0745 04/28/12 1640  NA 137 139 136 139  K 3.3* 4.1 3.5 3.9  CL 100 104 101 102  CO2 27 26 26 25   GLUCOSE 102* 90 99 86  BUN 4* 5* 8 14  CREATININE 0.67 0.71 0.72 0.73  CALCIUM 9.0 9.0 9.0 9.7  MG -- -- -- --  PHOS -- -- -- --   Liver Function Tests:  Lab 05/02/12 0405 04/28/12 1640  AST 24 19  ALT 9 9  ALKPHOS 73 86  BILITOT 2.4* 2.5*  PROT 6.9 7.8  ALBUMIN 3.8 4.3   No results found for this basename: LIPASE:5,AMYLASE:5 in the last 168 hours No results found for this basename: AMMONIA:5 in the last 168 hours CBC:  Lab 05/02/12 0405 04/30/12 0405 04/29/12 0745 04/28/12  1640  WBC 12.4* 9.0 9.9 13.6*  NEUTROABS -- -- -- 9.9*  HGB 9.8* 9.8* 10.3* 8.5*  HCT 26.7* 26.8* 27.9* 30.5*  MCV 89.6 90.2 90.3 90.5  PLT 247 224 246 239   Cardiac Enzymes: No results found for this basename: CKTOTAL:5,CKMB:5,CKMBINDEX:5,TROPONINI:5 in the last 168 hours BNP (last 3 results)  Basename 11/09/11 0452 08/26/11 0542 08/25/11 0655  PROBNP 401.2* 144.3* 199.1*   CBG: No results found for this basename: GLUCAP:5 in the last 168 hours  No results found for this or any previous visit (from the past 240 hour(s)).   Studies: Dg Chest  2 View  04/28/2012  *RADIOLOGY REPORT*  Clinical Data: Sickle cell crisis  CHEST - 2 VIEW  Comparison: Chest radiograph 11/10/2011  Findings: Normal cardiac silhouette.  The ascending aorta is prominent.  There is chronic scarring within the lungs.  No focal consolidation.  Trace pleural effusions.  IMPRESSION: 1.  No evidence of overt pulmonary edema or pneumonia. 2.   Chronic scarring of the lungs.  3.  Prominent ascending aorta.  This can be associated with aortic stenosis.   Original Report Authenticated By: Genevive Bi, M.D.    Dg Chest Port 1 View  04/28/2012  *RADIOLOGY REPORT*  Clinical Data: Chest pain, history of sickle cell disease.  PORTABLE CHEST - 1 VIEW  Comparison: 04/28/2012  Findings: Mild cardiac enlargement.  Normal lung volumes.  No pleural effusion or edema.  Chronic scarring is noted in both lungs and appears lower lobe predominant.  No superimposed airspace consolidation identified.  IMPRESSION:  1.  No change from prior exam. 2.  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   Original Report Authenticated By: Signa Kell, M.D.     Scheduled Meds:   . diclofenac sodium  2 g Topical QID  . enoxaparin (LOVENOX) injection  40 mg Subcutaneous Q24H  . folic acid  1 mg Oral Daily  .  HYDROmorphone (DILAUDID) injection  4 mg Intravenous Q2H  . hydroxyurea  1,000 mg Oral Daily  . morphine  60 mg Oral BID  . nicotine  14 mg Transdermal Custom   Continuous Infusions:   . dextrose 5 % and 0.45% NaCl 1,000 mL (05/02/12 1150)    Active Problems:  Sickle cell anemia with crisis

## 2012-05-03 LAB — CBC WITH DIFFERENTIAL/PLATELET
Basophils Relative: 1 % (ref 0–1)
Hemoglobin: 10.2 g/dL — ABNORMAL LOW (ref 13.0–17.0)
Lymphs Abs: 3 10*3/uL (ref 0.7–4.0)
MCHC: 36.7 g/dL — ABNORMAL HIGH (ref 30.0–36.0)
Monocytes Relative: 8 % (ref 3–12)
Neutro Abs: 7.2 10*3/uL (ref 1.7–7.7)
Neutrophils Relative %: 56 % (ref 43–77)
Platelets: 271 10*3/uL (ref 150–400)
RBC: 3.07 MIL/uL — ABNORMAL LOW (ref 4.22–5.81)

## 2012-05-03 LAB — COMPREHENSIVE METABOLIC PANEL
BUN: 6 mg/dL (ref 6–23)
CO2: 26 mEq/L (ref 19–32)
Calcium: 9.6 mg/dL (ref 8.4–10.5)
Chloride: 101 mEq/L (ref 96–112)
Creatinine, Ser: 0.7 mg/dL (ref 0.50–1.35)
GFR calc Af Amer: >90 mL/min (ref >90)
GFR calc non Af Amer: >90 mL/min (ref >90)
Glucose, Bld: 98 mg/dL (ref 70–99)
Potassium: 4.4 mEq/L (ref 3.5–5.1)
Sodium: 137 mEq/L (ref 135–145)

## 2012-05-03 MED ORDER — ONDANSETRON HCL 4 MG/2ML IJ SOLN
4.0000 mg | Freq: Four times a day (QID) | INTRAMUSCULAR | Status: DC | PRN
  Administered 2012-05-03 – 2012-05-04 (×3): 4 mg via INTRAVENOUS
  Filled 2012-05-03 (×3): qty 2

## 2012-05-03 MED ORDER — HYDROMORPHONE 0.3 MG/ML IV SOLN
INTRAVENOUS | Status: DC
  Administered 2012-05-03 (×2): via INTRAVENOUS
  Administered 2012-05-03: 3.5 mg via INTRAVENOUS
  Administered 2012-05-03: 7.5 mg via INTRAVENOUS
  Administered 2012-05-04: 08:00:00 via INTRAVENOUS
  Administered 2012-05-04: 2.7 mg via INTRAVENOUS
  Administered 2012-05-04: 2.4 mg via INTRAVENOUS
  Administered 2012-05-04: via INTRAVENOUS
  Administered 2012-05-04: 5.1 mg via INTRAVENOUS
  Filled 2012-05-03 (×4): qty 25

## 2012-05-03 MED ORDER — DIPHENHYDRAMINE HCL 50 MG PO CAPS: 50.0000 mg | ORAL_CAPSULE | Freq: Four times a day (QID) | ORAL | Status: DC | PRN

## 2012-05-03 MED ORDER — SODIUM CHLORIDE 0.9 % IJ SOLN: 9.0000 mL | INTRAMUSCULAR | Status: DC | PRN

## 2012-05-03 MED ORDER — NALOXONE HCL 0.4 MG/ML IJ SOLN: 0.4000 mg | INTRAMUSCULAR | Status: DC | PRN

## 2012-05-03 NOTE — Progress Notes (Signed)
SICKLE CELL SERVICE PROGRESS NOTE  [name] ZOX:[id number] DOB: [birth year] DOA: 04/28/2012 PCP: Dorrene German, MD  Assessment/Plan:  1. Sickle Cell Anemia With Crisis: Pt with acute episodic pain of VOC.  Pt's pain relief still not lasting through the interval between doses of analgesics. I will change to PCA . Continue Toradol. Decrease IVF as patient is fully hydrated. Continue Hydrea and Folic acid. 2. Hypokalemia:  Replaced. Now within normal limits. 3. Tobacco Use Disorder: Pt will continue on Nicoderm patch.  Code Status: Full Code Family Communication: N/A Disposition Plan: Home at time of discharge.  Maykel Reitter A.  [facility] Pager [phone]. If 7PM-7AM, please contact night-coverage. 05/03/2012, 1:51 PM  LOS: 5 days   Brief narrative: PT with [facility] [place] who was initially seen in the day center for acute VOC. However, pain was still not controlled and he was admitted to in-patient service for more aggressive pain management.  Consultants:  None  Procedures:  None  Antibiotics:  None  HPI/Subjective: Pt states that pain is still localized to back and ribs. He rates the pain as 8/10 which decreases to 6/10 with bolus medications and relief last for approximately 1- 11/2 hours. He denies any nausea or emesis.  Objective: Filed Vitals:   05/03/12 0954 05/03/12 1224 05/03/12 1232 05/03/12 1245  BP: 124/72  128/80 112/86  Pulse: 88  90   Temp: 98.6 F (37 C)  98.5 F (36.9 C) 98.6 F (37 C)  TempSrc: Oral  Oral Oral  Resp: 18 15  18   Height:      Weight:      SpO2: 97% 93%  94%   Weight change:   Intake/Output Summary (Last 24 hours) at 05/03/12 1351 Last data filed at 05/03/12 8295  Gross per 24 hour  Intake 7229.75 ml  Output   1925 ml  Net 5304.75 ml    General: Alert, awake, oriented x3, in no acute distress. Very pleasant demeanor.  HEENT: [place]/AT PEERL, EOMI. Mild icterus OROPHARYNX:  Moist, No exudate/ erythema/lesions.  Heart:  Regular rate and rhythm, without murmurs, rubs, gallops.  Lungs: Clear to auscultation, no wheezing or rhonchi noted.   Abdomen: Soft, nontender, nondistended, positive bowel sounds, no masses no hepatosplenomegaly noted. Neuro: No focal neurological deficits noted. Strength normal in bilateral upper and lower extremities. Musculoskeletal: No warm swelling or erythema around joints.     Data Reviewed: Basic Metabolic Panel:  Lab 05/03/12 6213 05/02/12 0405 04/30/12 0405 04/29/12 0745 04/28/12 1640  NA 137 137 139 136 139  K 4.4 3.3* 4.1 3.5 3.9  CL 101 100 104 101 102  CO2 26 27 26 26 25   GLUCOSE 98 102* 90 99 86  BUN 6 4* 5* 8 14  CREATININE 0.70 0.67 0.71 0.72 0.73  CALCIUM 9.6 9.0 9.0 9.0 9.7  MG -- -- -- -- --  PHOS -- -- -- -- --   Liver Function Tests:  Lab 05/03/12 0359 05/02/12 0405 04/28/12 1640  AST 24 24 19   ALT 10 9 9   ALKPHOS 80 73 86  BILITOT 3.0* 2.4* 2.5*  PROT 7.9 6.9 7.8  ALBUMIN 4.2 3.8 4.3   No results found for this basename: LIPASE:5,AMYLASE:5 in the last 168 hours No results found for this basename: AMMONIA:5 in the last 168 hours CBC:  Lab 05/03/12 0359 05/02/12 0405 04/30/12 0405 04/29/12 0745 04/28/12 1640  WBC 12.9* 12.4* 9.0 9.9 13.6*  NEUTROABS 7.2 -- -- -- 9.9*  HGB 10.2* 9.8* 9.8* 10.3* 8.5*  HCT 27.8* 26.7* 26.8* 27.9* 30.5*  MCV 90.6 89.6 90.2 90.3 90.5  PLT 271 247 224 246 239   Cardiac Enzymes: No results found for this basename: CKTOTAL:5,CKMB:5,CKMBINDEX:5,TROPONINI:5 in the last 168 hours BNP (last 3 results)  Basename 11/09/11 0452 08/26/11 0542 08/25/11 0655  PROBNP 401.2* 144.3* 199.1*   CBG: No results found for this basename: GLUCAP:5 in the last 168 hours  No results found for this or any previous visit (from the past 240 hour(s)).   Studies: Dg Chest 2 View  04/28/2012  *RADIOLOGY REPORT*  Clinical Data: Sickle cell crisis  CHEST - 2 VIEW  Comparison: Chest radiograph 11/10/2011  Findings: Normal cardiac  silhouette.  The ascending aorta is prominent.  There is chronic scarring within the lungs.  No focal consolidation.  Trace pleural effusions.  IMPRESSION: 1.  No evidence of overt pulmonary edema or pneumonia. 2.   Chronic scarring of the lungs.  3.  Prominent ascending aorta.  This can be associated with aortic stenosis.   Original Report Authenticated By: Genevive Bi, M.D.    Dg Chest Port 1 View  04/28/2012  *RADIOLOGY REPORT*  Clinical Data: Chest pain, history of sickle cell disease.  PORTABLE CHEST - 1 VIEW  Comparison: 04/28/2012  Findings: Mild cardiac enlargement.  Normal lung volumes.  No pleural effusion or edema.  Chronic scarring is noted in both lungs and appears lower lobe predominant.  No superimposed airspace consolidation identified.  IMPRESSION:  1.  No change from prior exam. 2.  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   Original Report Authenticated By: Signa Kell, M.D.     Scheduled Meds:    . diclofenac sodium  2 g Topical QID  . enoxaparin (LOVENOX) injection  40 mg Subcutaneous Q24H  . folic acid  1 mg Oral Daily  . HYDROmorphone PCA 0.3 mg/mL   Intravenous Q4H  . hydroxyurea  1,000 mg Oral Daily  . morphine  60 mg Oral BID  . nicotine  14 mg Transdermal Custom   Continuous Infusions:    . dextrose 5 % and 0.45% NaCl 1,000 mL (05/02/12 1717)    Active Problems:  Sickle cell anemia with crisis  Tobacco abuse  Anemia  Hypokalemia  Leukocytosis

## 2012-05-03 NOTE — Plan of Care (Signed)
Problem: Phase III Progression Outcomes Goal: IV fluids wean to po Outcome: Progressing kvo ivf's on 05/02/12, patient drinking juices and water.

## 2012-05-04 LAB — CBC WITH DIFFERENTIAL/PLATELET
Basophils Relative: 1 % (ref 0–1)
HCT: 25.3 % — ABNORMAL LOW (ref 39.0–52.0)
Hemoglobin: 9.4 g/dL — ABNORMAL LOW (ref 13.0–17.0)
Lymphocytes Relative: 26 % (ref 12–46)
Lymphs Abs: 2.9 10*3/uL (ref 0.7–4.0)
Monocytes Relative: 11 % (ref 3–12)
Neutro Abs: 5.4 10*3/uL (ref 1.7–7.7)
Neutrophils Relative %: 48 % (ref 43–77)
RBC: 2.8 MIL/uL — ABNORMAL LOW (ref 4.22–5.81)
WBC: 11.2 10*3/uL — ABNORMAL HIGH (ref 4.0–10.5)

## 2012-05-04 LAB — COMPREHENSIVE METABOLIC PANEL
ALT: 10 U/L (ref 0–53)
AST: 23 U/L (ref 0–37)
Albumin: 3.8 g/dL (ref 3.5–5.2)
Chloride: 98 mEq/L (ref 96–112)
Creatinine, Ser: 0.75 mg/dL (ref 0.50–1.35)
Potassium: 4 mEq/L (ref 3.5–5.1)
Sodium: 134 mEq/L — ABNORMAL LOW (ref 135–145)
Total Bilirubin: 2.4 mg/dL — ABNORMAL HIGH (ref 0.3–1.2)

## 2012-05-04 MED ORDER — MORPHINE SULFATE 30 MG PO TABS
60.0000 mg | ORAL_TABLET | ORAL | Status: DC
  Administered 2012-05-04: 60 mg via ORAL
  Filled 2012-05-04: qty 2

## 2012-05-04 MED ORDER — HYDROMORPHONE HCL PF 2 MG/ML IJ SOLN
2.0000 mg | INTRAMUSCULAR | Status: DC | PRN
  Administered 2012-05-04: 2 mg via INTRAVENOUS
  Filled 2012-05-04: qty 1

## 2012-05-04 NOTE — Discharge Summary (Signed)
[name] MRN: [id number] DOB/AGE: 1976/02/23 37 y.o.  Admit date: 04/28/2012 Discharge date: 05/04/2012  Primary Care Physician:  Dorrene German, MD   Discharge Diagnoses:   Patient Active Problem List  Diagnosis  . Sickle cell anemia with crisis  . Constipation  . Acute chest syndrome(517.3)  . Tobacco abuse  . Elevated brain natriuretic peptide (BNP) level  . Avascular necrosis of hip, left  . Anemia  . Hypokalemia  . Leukocytosis    DISCHARGE MEDICATION:   Medication List     As of 05/04/2012  2:09 PM    TAKE these medications         diclofenac sodium 1 % Gel   Commonly known as: VOLTAREN   Apply 1 application topically 4 (four) times daily.      folic acid 1 MG tablet   Commonly known as: FOLVITE   Take 1 tablet (1 mg total) by mouth daily.      morphine 60 MG 12 hr tablet   Commonly known as: MS CONTIN   Take 1 tablet (60 mg total) by mouth 2 (two) times daily.      morphine 30 MG tablet   Commonly known as: MSIR   Take 1 tablet (30 mg total) by mouth every 4 (four) hours as needed for pain. Breakthrough pain      nicotine 14 mg/24hr patch   Commonly known as: NICODERM CQ - dosed in mg/24 hours   Place 1 patch onto the skin daily.      promethazine 25 MG tablet   Commonly known as: PHENERGAN   Take 1 tablet (25 mg total) by mouth every 6 (six) hours as needed.          Consults:     SIGNIFICANT DIAGNOSTIC STUDIES:  Dg Chest 2 View  04/28/2012  *RADIOLOGY REPORT*  Clinical Data: Sickle cell crisis  CHEST - 2 VIEW  Comparison: Chest radiograph 11/10/2011  Findings: Normal cardiac silhouette.  The ascending aorta is prominent.  There is chronic scarring within the lungs.  No focal consolidation.  Trace pleural effusions.  IMPRESSION: 1.  No evidence of overt pulmonary edema or pneumonia. 2.   Chronic scarring of the lungs.  3.  Prominent ascending aorta.  This can be associated with aortic stenosis.   Original Report Authenticated By: Genevive Bi, M.D.    Dg Chest Port 1 View  04/28/2012  *RADIOLOGY REPORT*  Clinical Data: Chest pain, history of sickle cell disease.  PORTABLE CHEST - 1 VIEW  Comparison: 04/28/2012  Findings: Mild cardiac enlargement.  Normal lung volumes.  No pleural effusion or edema.  Chronic scarring is noted in both lungs and appears lower lobe predominant.  No superimposed airspace consolidation identified.  IMPRESSION:  1.  No change from prior exam. 2.  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   Original Report Authenticated By: Signa Kell, M.D.      BRIEF ADMITTING H & P: This 26 her old male was admitted to the sickle cell Center for 23 hour observation and treatment with IV fluids and IV analgesics for a sickle cell crisis. Despite the above mentioned treatment the patient's pain is not adequately controlled to allow him to return home. Because of this patient will be discharged from the sickle cell Center and readmitted as an inpatient at [facility] Course:  Present on Admission:  . Sickle cell anemia with crisis: Pt was initially treated with bolus doses of IV analgesics.  This was ineffective in controlling the patient's pain and he was thus started on a PCA. This effectively decreased pain. Pt was placed on his oral regimen and the PCA weaned off. At the time of  discharge, pt's pain was at a level that was manageable at home. He was discharged on his usual home regimen, and will follow up with his PMD in 1-2 weeks as needed.  . Tobacco abuse: Pt was counseled against further tobacco use particularly as it pertains to it's impact on sickle cell anemia.  . Anemia: Pt's hemoglobin was stable at 9.4. He states that his usual Hgb is around 9.5.  Marland KitchenConstipation: Pt was treated with sorbitol with little success. He refused a suppository and other laxatives. At the time  of discharge pt still had not had a BM and refused further intervention.  Disposition and  Follow-up:  Pt is discharged to home in good condition.  He is to follow up with his PMD in 1-2 weeks as needed.     Discharge Orders    Future Orders Please Complete By Expires   Diet general      Activity as tolerated - No restrictions         DISCHARGE EXAM: General: Alert, awake, oriented x3, in no acute distress. Very pleasant demeanor.  Vital Signs:BP 119/72, HR 62, T97.4 F (36.3 C), temperature source Oral, RR 16, height 6\' 2"  (1.88 m), weight 82.237 kg (181 lb 4.8 oz), SpO2 97.00%. HEENT: [name]/AT PEERL, EOMI. Mild icterus  OROPHARYNX: Moist, No exudate/ erythema/lesions.  Heart: Regular rate and rhythm, without murmurs, rubs, gallops.  Lungs: Clear to auscultation, no wheezing or rhonchi noted.  Abdomen: Soft, nontender, nondistended, positive bowel sounds, no masses no hepatosplenomegaly noted.  Neuro: No focal neurological deficits noted. Strength normal in bilateral upper and lower extremities.  Musculoskeletal: No warm swelling or erythema around joints.     Basename 05/04/12 0855 05/03/12 0359  NA 134* 137  K 4.0 4.4  CL 98 101  CO2 28 26  GLUCOSE 83 98  BUN 9 6  CREATININE 0.75 0.70  CALCIUM 9.1 9.6  MG -- --  PHOS -- --    Basename 05/04/12 0855 05/03/12 0359  AST 23 24  ALT 10 10  ALKPHOS 72 80  BILITOT 2.4* 3.0*  PROT 7.3 7.9  ALBUMIN 3.8 4.2   No results found for this basename: LIPASE:2,AMYLASE:2 in the last 72 hours  Basename 05/04/12 0855 05/03/12 0359  WBC 11.2* 12.9*  NEUTROABS 5.4 7.2  HGB 9.4* 10.2*  HCT 25.3* 27.8*  MCV 90.4 90.6  PLT 264 271   Total time for discharge including decision making and face to face time was greater than 30 minutes. Signed: Zarin Knupp A. 05/04/2012, 2:09 PM

## 2012-05-04 NOTE — Progress Notes (Signed)
No stool in 4 days. Given sorbitol prn last pm without results. May need a stronger laxative and/or scheduled laxatives.

## 2012-09-23 NOTE — Telephone Encounter (Signed)
04/29/2012 08:20 PM Phone (Incoming) Dr. Adela Glimpse (Provider) [phone]  Advised this patient needs to be admitted inpatient due o his 23 hour observation will end at 2202, and he does not feel better. Dr Adela Glimpse stated she does not know what to do she will call NP to get help.    By Jama Flavors, RN

## 2012-09-23 NOTE — Telephone Encounter (Signed)
04/28/2012 09:30 PM Phone (Incoming) Chyrel Masson, RN @ [facility] ED (Other) [phone]  Spoke with Chyrel Masson RN in ED, who agreed this patient is hemodamically stable, no CVA, no telemetry, no acute chest syndrome. Both Dr. Ethelda Chick and Donnamarie Poag NP feels pt will benefit from 23 hour obs. This RN advise pt normally goes inpt.    By Jama Flavors, RN

## 2012-10-13 ENCOUNTER — Inpatient Hospital Stay (HOSPITAL_COMMUNITY)
Admission: EM | Admit: 2012-10-13 | Discharge: 2012-10-21 | DRG: 812 | Disposition: A | Discharge status: Home / Self Care | Payer: Medicare Other | Attending: Internal Medicine | Admitting: Internal Medicine

## 2012-10-13 ENCOUNTER — Encounter (HOSPITAL_COMMUNITY): Payer: Self-pay | Admitting: Emergency Medicine

## 2012-10-13 ENCOUNTER — Emergency Department (HOSPITAL_COMMUNITY): Payer: Medicare Other

## 2012-10-13 DIAGNOSIS — F32A Depression, unspecified: Secondary | ICD-10-CM

## 2012-10-13 DIAGNOSIS — D649 Anemia, unspecified: Secondary | ICD-10-CM

## 2012-10-13 DIAGNOSIS — Z87891 Personal history of nicotine dependence: Secondary | ICD-10-CM

## 2012-10-13 DIAGNOSIS — Z96649 Presence of unspecified artificial hip joint: Secondary | ICD-10-CM

## 2012-10-13 DIAGNOSIS — F3289 Other specified depressive episodes: Secondary | ICD-10-CM | POA: Diagnosis present

## 2012-10-13 DIAGNOSIS — D5700 Hb-SS disease with crisis, unspecified: Principal | ICD-10-CM

## 2012-10-13 DIAGNOSIS — Z720 Tobacco use: Secondary | ICD-10-CM | POA: Diagnosis present

## 2012-10-13 DIAGNOSIS — M87059 Idiopathic aseptic necrosis of unspecified femur: Secondary | ICD-10-CM | POA: Diagnosis present

## 2012-10-13 DIAGNOSIS — D5720 Sickle-cell/Hb-C disease without crisis: Secondary | ICD-10-CM

## 2012-10-13 DIAGNOSIS — M87052 Idiopathic aseptic necrosis of left femur: Secondary | ICD-10-CM

## 2012-10-13 DIAGNOSIS — F329 Major depressive disorder, single episode, unspecified: Secondary | ICD-10-CM | POA: Diagnosis present

## 2012-10-13 DIAGNOSIS — D5701 Hb-SS disease with acute chest syndrome: Secondary | ICD-10-CM

## 2012-10-13 LAB — CBC WITH DIFFERENTIAL/PLATELET
Basophils Relative: 1 % (ref 0–1)
Eosinophils Relative: 6 % — ABNORMAL HIGH (ref 0–5)
Hemoglobin: 9.9 g/dL — ABNORMAL LOW (ref 13.0–17.0)
Lymphs Abs: 2.6 10*3/uL (ref 0.7–4.0)
MCV: 89.9 fL (ref 78.0–100.0)
Monocytes Relative: 9 % (ref 3–12)
Neutrophils Relative %: 59 % (ref 43–77)
Platelets: 287 10*3/uL (ref 150–400)
RBC: 2.98 MIL/uL — ABNORMAL LOW (ref 4.22–5.81)
WBC: 10.3 10*3/uL (ref 4.0–10.5)

## 2012-10-13 LAB — BASIC METABOLIC PANEL
BUN: 9 mg/dL (ref 6–23)
Chloride: 103 mEq/L (ref 96–112)
GFR calc Af Amer: >90 mL/min (ref >90)
Potassium: 3.8 mEq/L (ref 3.5–5.1)

## 2012-10-13 LAB — RETICULOCYTES: RBC.: 3.41 MIL/uL — ABNORMAL LOW (ref 4.22–5.81)

## 2012-10-13 LAB — CREATININE, SERUM
Creatinine, Ser: 0.72 mg/dL (ref 0.50–1.35)
GFR calc Af Amer: >90 mL/min (ref >90)

## 2012-10-13 LAB — LACTATE DEHYDROGENASE: LDH: 216 U/L (ref 94–250)

## 2012-10-13 MED ORDER — SODIUM CHLORIDE 0.9 % IJ SOLN: 3.0000 mL | INTRAMUSCULAR | Status: DC | PRN

## 2012-10-13 MED ORDER — ONDANSETRON HCL 4 MG/2ML IJ SOLN
4.0000 mg | INTRAMUSCULAR | Status: DC | PRN
  Administered 2012-10-15 – 2012-10-21 (×6): 4 mg via INTRAVENOUS
  Filled 2012-10-13 (×6): qty 2

## 2012-10-13 MED ORDER — SODIUM CHLORIDE 0.9 % IJ SOLN
3.0000 mL | Freq: Two times a day (BID) | INTRAMUSCULAR | Status: DC
  Administered 2012-10-20: 3 mL via INTRAVENOUS

## 2012-10-13 MED ORDER — PROMETHAZINE HCL 25 MG/ML IJ SOLN
12.5000 mg | Freq: Four times a day (QID) | INTRAMUSCULAR | Status: DC | PRN
  Administered 2012-10-13 – 2012-10-20 (×9): 12.5 mg via INTRAVENOUS
  Filled 2012-10-13 (×10): qty 1

## 2012-10-13 MED ORDER — HYDROMORPHONE HCL PF 1 MG/ML IJ SOLN
1.0000 mg | INTRAMUSCULAR | Status: DC | PRN
  Administered 2012-10-13: 1 mg via INTRAVENOUS
  Filled 2012-10-13: qty 1

## 2012-10-13 MED ORDER — IBUPROFEN 800 MG PO TABS
800.0000 mg | ORAL_TABLET | Freq: Three times a day (TID) | ORAL | Status: DC
  Administered 2012-10-14 – 2012-10-16 (×5): 800 mg via ORAL
  Filled 2012-10-13 (×10): qty 1

## 2012-10-13 MED ORDER — SODIUM CHLORIDE 0.45 % IV SOLN
INTRAVENOUS | Status: DC
  Administered 2012-10-13 – 2012-10-15 (×2): via INTRAVENOUS
  Administered 2012-10-15: 1000 mL via INTRAVENOUS
  Administered 2012-10-16 – 2012-10-20 (×5): via INTRAVENOUS

## 2012-10-13 MED ORDER — BISACODYL 5 MG PO TBEC
5.0000 mg | DELAYED_RELEASE_TABLET | Freq: Every day | ORAL | Status: DC | PRN
  Administered 2012-10-19: 5 mg via ORAL
  Filled 2012-10-13: qty 1

## 2012-10-13 MED ORDER — HYDROMORPHONE HCL PF 1 MG/ML IJ SOLN
1.0000 mg | Freq: Once | INTRAMUSCULAR | Status: AC
  Administered 2012-10-13: 1 mg via INTRAVENOUS
  Filled 2012-10-13: qty 1

## 2012-10-13 MED ORDER — FOLIC ACID 1 MG PO TABS
1.0000 mg | ORAL_TABLET | Freq: Every day | ORAL | Status: DC
  Administered 2012-10-14 – 2012-10-21 (×8): 1 mg via ORAL
  Filled 2012-10-13 (×8): qty 1

## 2012-10-13 MED ORDER — SODIUM CHLORIDE 0.9 % IV SOLN
Freq: Once | INTRAVENOUS | Status: AC
  Administered 2012-10-13: 18:00:00 via INTRAVENOUS

## 2012-10-13 MED ORDER — HYDROMORPHONE HCL PF 1 MG/ML IJ SOLN
1.0000 mg | INTRAMUSCULAR | Status: DC | PRN
  Administered 2012-10-13 – 2012-10-16 (×18): 2 mg via INTRAVENOUS
  Filled 2012-10-13 (×18): qty 2

## 2012-10-13 MED ORDER — DOCUSATE SODIUM 100 MG PO CAPS
100.0000 mg | ORAL_CAPSULE | Freq: Two times a day (BID) | ORAL | Status: DC
  Administered 2012-10-13 – 2012-10-18 (×10): 100 mg via ORAL
  Filled 2012-10-13 (×11): qty 1

## 2012-10-13 MED ORDER — SODIUM CHLORIDE 0.9 % IV SOLN: 250.0000 mL | INTRAVENOUS | Status: DC | PRN

## 2012-10-13 MED ORDER — NICOTINE 14 MG/24HR TD PT24
14.0000 mg | MEDICATED_PATCH | TRANSDERMAL | Status: DC
  Administered 2012-10-13 – 2012-10-20 (×8): 14 mg via TRANSDERMAL
  Filled 2012-10-13 (×9): qty 1

## 2012-10-13 MED ORDER — SODIUM CHLORIDE 0.9 % IV BOLUS (SEPSIS)
1000.0000 mL | Freq: Once | INTRAVENOUS | Status: AC
  Administered 2012-10-13: 1000 mL via INTRAVENOUS

## 2012-10-13 MED ORDER — HEPARIN SODIUM (PORCINE) 5000 UNIT/ML IJ SOLN
5000.0000 [IU] | Freq: Three times a day (TID) | INTRAMUSCULAR | Status: DC
  Administered 2012-10-13 – 2012-10-14 (×2): 5000 [IU] via SUBCUTANEOUS
  Filled 2012-10-13 (×5): qty 1

## 2012-10-13 MED ORDER — ONDANSETRON HCL 4 MG PO TABS
4.0000 mg | ORAL_TABLET | ORAL | Status: DC | PRN
  Administered 2012-10-16: 4 mg via ORAL
  Filled 2012-10-13: qty 1

## 2012-10-13 NOTE — ED Notes (Signed)
Pt states he has been having increased sickle cell pain for last 10 days.  Pt states pain is in shoulder and back. Pain typical for crisis.

## 2012-10-13 NOTE — H&P (Signed)
PCP:   Dorrene German, MD   Chief Complaint:  Pains in joints.   HPI: This is a 37 year old male, with known history of sickle cell disease, anemia, s/p previous blood transfusions, right shoulder avascular necrosis, bilateral hip avascular necrosis, s/p right hip bone graft and subsequent right hip TKA. According to patient, he has had progressive pains in the right shoulder, bilateral lower rib cage, right>left, as well as both hips and legs, in the usual pattern of his pain crises, for the last 2 weeks-10 days. As symptoms have persisted/worsened despite utilization of his regular analgesics, he decided to see the doctor today. Usually , he gets his care at Dr Avbuere's office, but he had heard of [facility] for some months now, so he went there, was seen by Dr Ashley Royalty, and referred to the ED. He denies fever, chills, abdoinmal pain, diarrhea or vomiting. No cough, although he gets very occasionally mildly SOB.   Allergies:  No Known Allergies    Past Medical History  Diagnosis Date  . Sickle cell crisis   . Blood transfusion   . Avascular necrosis of hip     bilateral  . Infection of bone, shoulder region     left shoulder  . Pneumonia   . Avascular necrosis of hip, left 08/27/2011    Past Surgical History  Procedure Laterality Date  . Orif right hip fracture  1995  . Joint replacement  2006    right total hip arthroplasty    Prior to Admission medications   Medication Sig Start Date End Date Taking? Authorizing Provider  diclofenac sodium (VOLTAREN) 1 % GEL Apply 1 application topically 4 (four) times daily as needed (for joint pain).  08/27/11  Yes Keitha Butte, NP  folic acid (FOLVITE) 1 MG tablet Take 1 mg by mouth daily.   Yes Historical Provider, MD  morphine (MS CONTIN) 60 MG 12 hr tablet Take 1 tablet (60 mg total) by mouth 2 (two) times daily. 11/20/11  Yes Grayce Sessions, NP  morphine (MSIR) 30 MG tablet Take 1 tablet (30 mg total) by mouth  every 4 (four) hours as needed for pain. Breakthrough pain 11/20/11  Yes Grayce Sessions, NP  nicotine (NICODERM CQ - DOSED IN MG/24 HOURS) 14 mg/24hr patch Place 1 patch onto the skin daily.   Yes Historical Provider, MD  promethazine (PHENERGAN) 25 MG tablet Take 25 mg by mouth every 6 (six) hours as needed for nausea.   Yes Historical Provider, MD    Social History: Patient reports that he quit smoking about 8 months ago. His smoking use included Cigarettes. He smoked 0.50 packs per day. He has never used smokeless tobacco. He reports that he does not drink alcohol or use illicit drugs.  Family History reviewed: Unobtainable. Patient was adopted.   Review of Systems:  As per HPI and chief complaint. Patent denies fatigue, diminished appetite, weight loss, fever, chills, headache, blurred vision, difficulty in speaking, dysphagia, chest pain, cough, shortness of breath, orthopnea, paroxysmal nocturnal dyspnea, nausea, diaphoresis, abdominal pain, vomiting, diarrhea, belching, heartburn, hematemesis, melena, dysuria, nocturia, urinary frequency, hematochezia, lower extremity swelling or redness. The rest of the systems review is negative.  Physical Exam:  General:  Patient does not appear to be in obvious acute distress. Alert, communicative, fully oriented, talking in complete sentences, not short of breath at rest.  HEENT:  Mild-moderate clinical pallor, no jaundice, no conjunctival injection or discharge. NECK:  Supple, JVP not  seen, no carotid bruits, no palpable lymphadenopathy, no palpable goiter. CHEST:  Clinically clear to auscultation, no wheezes, no crackles. HEART:  Sounds 1 and 2 heard, normal, regular, no murmurs. ABDOMEN:  Flat, soft, non-tender, no palpable organomegaly, no palpable masses, normal bowel sounds. GENITALIA:  Not examined. LOWER EXTREMITIES:  No pitting edema, palpable peripheral pulses. MUSCULOSKELETAL SYSTEM:  Unremarkable. CENTRAL NERVOUS SYSTEM:  No focal  neurologic deficit on gross examination.  Labs on Admission:  Results for orders placed during the hospital encounter of 10/13/12 (from the past 48 hour(s))  RETICULOCYTES     Status: Abnormal   Collection Time    10/13/12  1:40 PM      Result Value Range   Retic Ct Pct 2.8  0.4 - 3.1 %   RBC. 3.41 (*) 4.22 - 5.81 MIL/uL   Retic Count, Manual 95.5  19.0 - 186.0 K/uL  BASIC METABOLIC PANEL     Status: None   Collection Time    10/13/12  3:21 PM      Result Value Range   Sodium 136  135 - 145 mEq/L   Potassium 3.8  3.5 - 5.1 mEq/L   Chloride 103  96 - 112 mEq/L   CO2 24  19 - 32 mEq/L   Glucose, Bld 81  70 - 99 mg/dL   BUN 9  6 - 23 mg/dL   Creatinine, Ser 4.09  0.50 - 1.35 mg/dL   Calcium 8.8  8.4 - 81.1 mg/dL   GFR calc non Af Amer >90  >90 mL/min   GFR calc Af Amer >90  >90 mL/min   Comment:            The eGFR has been calculated     using the CKD EPI equation.     This calculation has not been     validated in all clinical     situations.     eGFR's persistently     <90 mL/min signify     possible Chronic Kidney Disease.  LACTATE DEHYDROGENASE     Status: None   Collection Time    10/13/12  3:21 PM      Result Value Range   LDH 216  94 - 250 U/L   Comment: NO VISIBLE HEMOLYSIS    Radiological Exams on Admission: Dg Shoulder Right  10/13/2012   *RADIOLOGY REPORT*  Clinical Data: Pain.  Sickle cell crisis  RIGHT SHOULDER - 2+ VIEW  Comparison: 11/10/2011  Findings: Normal alignment and no fracture.  No significant degenerative change.  Sclerotic bony density in the humerus compatible with sickle cell.  IMPRESSION: No acute abnormality.   Original Report Authenticated By: Janeece Riggers, M.D.   Dg Hip Complete Right  10/13/2012   *RADIOLOGY REPORT*  Clinical Data: Sickle cell crisis  RIGHT HIP - COMPLETE 2+ VIEW  Comparison: 02/17/2005  Findings: Total hip replacement on the right in satisfactory position and alignment.  No fracture or complication.  Probable AVN left  femoral head.  Degenerative changes and sclerosis in the SI joints.  IMPRESSION: No acute abnormality.   Original Report Authenticated By: Janeece Riggers, M.D.    Assessment/Plan Active Problems:   1. Sickle cell pain crisis: Patient is a known sickler, presenting with approximately 2-weeks of progressive joint and rib cage pains, consistent with his usual pattern of pain crisis. without obvious precipitating factors. He has failed outpatient treatment with his pre-admission opioid medication. Reticulocyte count is only 2.8, so this appears to be a mild crisis.  Will admit, hydrate and commence parenteral opioids, as well as Folate. Patient appear to be an ideal candidate to establish with the sickle cell clinic on discharge.  supplements.  2. Anemia: Clinically, patient appears pale. Unfortunately, CBC was not available at the time of this evaluation. We shall follow CBC and transfuse if and when indicated.  3. History of tobacco abuse: Per patient, he has quit smoking for about 8 weeks. He requests Nicoderm CQ , while hospitalized.   Further management will depend on clinical course.   Comment: Patient is FULL CODE.    Time Spent on Admission: 45 mins.   Celene Pippins,CHRISTOPHER 10/13/2012, 5:16 PM

## 2012-10-14 DIAGNOSIS — D5701 Hb-SS disease with acute chest syndrome: Secondary | ICD-10-CM

## 2012-10-14 LAB — CBC WITH DIFFERENTIAL/PLATELET
Basophils Absolute: 0.1 10*3/uL (ref 0.0–0.1)
Basophils Relative: 1 % (ref 0–1)
Eosinophils Absolute: 0.6 10*3/uL (ref 0.0–0.7)
HCT: 26.6 % — ABNORMAL LOW (ref 39.0–52.0)
MCH: 31.9 pg (ref 26.0–34.0)
MCHC: 35.7 g/dL (ref 30.0–36.0)
Monocytes Absolute: 1 10*3/uL (ref 0.1–1.0)
Monocytes Relative: 10 % (ref 3–12)
Neutro Abs: 5 10*3/uL (ref 1.7–7.7)
Neutrophils Relative %: 50 % (ref 43–77)
RDW: 14.4 % (ref 11.5–15.5)

## 2012-10-14 LAB — COMPREHENSIVE METABOLIC PANEL
AST: 15 U/L (ref 0–37)
CO2: 28 mEq/L (ref 19–32)
Calcium: 9 mg/dL (ref 8.4–10.5)
Creatinine, Ser: 0.74 mg/dL (ref 0.50–1.35)
GFR calc non Af Amer: >90 mL/min (ref >90)
Sodium: 138 mEq/L (ref 135–145)
Total Protein: 7.1 g/dL (ref 6.0–8.3)

## 2012-10-14 MED ORDER — ENOXAPARIN SODIUM 40 MG/0.4ML ~~LOC~~ SOLN
40.0000 mg | SUBCUTANEOUS | Status: DC
  Administered 2012-10-14 – 2012-10-20 (×6): 40 mg via SUBCUTANEOUS
  Filled 2012-10-14 (×8): qty 0.4

## 2012-10-14 MED ORDER — SORBITOL 70 % SOLN
30.0000 mL | Freq: Every day | Status: DC
  Administered 2012-10-14 – 2012-10-21 (×8): 30 mL via ORAL
  Filled 2012-10-14 (×8): qty 30

## 2012-10-14 NOTE — Progress Notes (Signed)
Patient ID: [name], male   DOB: 1975/07/27, 37 y.o.   MRN: [id number]  [facility] PROGRESS NOTE  CYNCERE SONTAG IHK:[id number] DOB: [birth year] DOA: 10/13/2012 PCP: Dorrene German, MD  Brief narrative: 37 year old male, with known history of sickle cell disease, anemia, s/p previous blood transfusions, right shoulder avascular necrosis, bilateral hip avascular necrosis, s/p right hip bone graft and subsequent right hip TKA. According to patient, he has had progressive pains in the right shoulder, bilateral lower rib cage, right>left, as well as both hips and legs, in the usual pattern of his pain crises, for the last 2 weeks. As symptoms have persisted/worsened despite utilization of his regular analgesics, he decided to see the doctor today. Usually , he gets his care at Dr Avbuere's office, but he heard of [facility] for some months now, so he went there, was seen by Dr Ashley Royalty, and referred to the ED. He denies fever, chills, abdoinmal pain, diarrhea or vomiting. No cough, although he gets very occasionally mildly SOB.   Active Problems:   Sickle cell pain crisis - pt clinically improving, responding well to supportive care - will continue IVF, analgesia as needed - no need for transfusion at this time as Hg and Hct are stable and at pt's baseline - will repeat CBC and BMP in AM   History of tobacco abuse - cessation discussed in detail   Consultants:  None  Procedures/Studies: Dg Shoulder Right 10/13/2012  No acute abnormality.     Dg Hip Complete Right 10/13/2012   No acute abnormality.    Antibiotics:  None  Code Status: Full Family Communication: Pt at bedside Disposition Plan: Home in AM  HPI/Subjective: No events overnight.   Objective: Filed Vitals:   10/14/12 0610 10/14/12 0644 10/14/12 1009 10/14/12 1422  BP: 122/82  120/65 123/76  Pulse: 61  66 73  Temp: 98 F (36.7 C)  98.7 F (37.1 C) 98.7 F (37.1 C)  TempSrc: Oral  Oral  Oral  Resp: 16  16 16   Height:      Weight:  77.293 kg (170 lb 6.4 oz)    SpO2: 97%  95% 96%    Intake/Output Summary (Last 24 hours) at 10/14/12 1529 Last data filed at 10/14/12 1422  Gross per 24 hour  Intake    240 ml  Output   2400 ml  Net  -2160 ml    Exam:   General:  Pt is alert, follows commands appropriately, not in acute distress  Cardiovascular: Regular rate and rhythm, S1/S2, no murmurs, no rubs, no gallops  Respiratory: Clear to auscultation bilaterally, no wheezing, no crackles, no rhonchi  Abdomen: Soft, non tender, non distended, bowel sounds present, no guarding  Extremities: No edema, pulses DP and PT palpable bilaterally  Neuro: Grossly nonfocal  Data Reviewed: Basic Metabolic Panel:  Recent Labs Lab 10/13/12 1521 10/13/12 1559 10/14/12 0425  NA 136  --  138  K 3.8  --  4.1  CL 103  --  105  CO2 24  --  28  GLUCOSE 81  --  85  BUN 9  --  9  CREATININE 0.72 0.72 0.74  CALCIUM 8.8  --  9.0  MG  --  1.9  --    Liver Function Tests:  Recent Labs Lab 10/14/12 0425  AST 15  ALT 9  ALKPHOS 75  BILITOT 1.9*  PROT 7.1  ALBUMIN 3.9   CBC:  Recent Labs Lab 10/13/12  1559 10/14/12 0425  WBC 10.3 10.1  NEUTROABS 6.1 5.0  HGB 9.9* 9.5*  HCT 26.8* 26.6*  MCV 89.9 89.3  PLT 287 240   Scheduled Meds: . docusate sodium  100 mg Oral BID  . enoxaparin (LOVENOX) injection  40 mg Subcutaneous Q24H  . folic acid  1 mg Oral Daily  . ibuprofen  800 mg Oral TID WC  . nicotine  14 mg Transdermal Q24H  . sodium chloride  3 mL Intravenous Q12H  . sorbitol  30 mL Oral Daily   Continuous Infusions: . sodium chloride 75 mL/hr at 10/13/12 1821   Debbora Presto, MD  [facility] Pager [phone]  If 7PM-7AM, please contact night-coverage [email or web address] Password [email or web address] 10/14/2012, 3:29 PM   LOS: 1 day

## 2012-10-15 LAB — GLUCOSE, CAPILLARY

## 2012-10-15 LAB — CBC
HCT: 25.9 % — ABNORMAL LOW (ref 39.0–52.0)
Hemoglobin: 9.4 g/dL — ABNORMAL LOW (ref 13.0–17.0)
MCH: 32.2 pg (ref 26.0–34.0)
MCV: 88.7 fL (ref 78.0–100.0)
RBC: 2.92 MIL/uL — ABNORMAL LOW (ref 4.22–5.81)
WBC: 9.3 10*3/uL (ref 4.0–10.5)

## 2012-10-15 LAB — COMPREHENSIVE METABOLIC PANEL
AST: 19 U/L (ref 0–37)
Albumin: 3.8 g/dL (ref 3.5–5.2)
BUN: 11 mg/dL (ref 6–23)
Chloride: 104 mEq/L (ref 96–112)
Creatinine, Ser: 0.72 mg/dL (ref 0.50–1.35)
Total Bilirubin: 2.1 mg/dL — ABNORMAL HIGH (ref 0.3–1.2)
Total Protein: 7.2 g/dL (ref 6.0–8.3)

## 2012-10-15 NOTE — ED Provider Notes (Signed)
History     CSN: [id number]  Arrival date & time 10/13/12  1304   First MD Initiated Contact with Patient 10/13/12 1305      Chief Complaint  Patient presents with  . Sickle Cell Pain Crisis    (Consider location/radiation/quality/duration/timing/severity/associated sxs/prior treatment) HPI Comments: 37 year old male, with known history of sickle cell disease, anemia, s/p previous blood transfusions, right shoulder avascular necrosis, bilateral hip avascular necrosis, s/p right hip bone graft and subsequent right hip TKA. According to patient, he has had progressive pains in the right shoulder, bilateral lower rib cage, right>left, as well as both hips and legs, in the usual pattern of his pain crises, for the last 2 weeks. As symptoms have persisted/worsened despite utilization of his regular analgesics, he decided to come to the ED. He denies fever, chills, abdoinmal pain, diarrhea or vomiting. No cough, although he gets very occasionally mildly SOB.    Patient is a 37 y.o. male presenting with sickle cell pain. The history is provided by the patient.  Sickle Cell Pain Crisis Associated symptoms: no chest pain, no cough and no shortness of breath     Past Medical History  Diagnosis Date  . Sickle cell crisis   . Blood transfusion   . Avascular necrosis of hip     bilateral  . Infection of bone, shoulder region     left shoulder  . Pneumonia   . Avascular necrosis of hip, left 08/27/2011    Past Surgical History  Procedure Laterality Date  . Orif right hip fracture  1995  . Joint replacement  2006    right total hip arthroplasty    History reviewed. No pertinent family history.  History  Substance Use Topics  . Smoking status: Former Smoker -- 0.50 packs/day    Types: Cigarettes    Quit date: 01/27/2012  . Smokeless tobacco: Never Used  . Alcohol Use: No      Review of Systems  Constitutional: Negative for activity change and appetite change.  Respiratory:  Negative for cough and shortness of breath.   Cardiovascular: Negative for chest pain.  Gastrointestinal: Negative for abdominal pain.  Genitourinary: Negative for dysuria.  Musculoskeletal: Positive for arthralgias.    Allergies  Review of patient's allergies indicates no known allergies.  Home Medications  No current outpatient prescriptions on file.  BP 122/76  Pulse 57  Temp(Src) 98.3 F (36.8 C) (Oral)  Resp 16  Ht 6\' 2"  (1.88 m)  Wt 171 lb 12.8 oz (77.928 kg)  BMI 22.05 kg/m2  SpO2 97%  Physical Exam  Nursing note and vitals reviewed. Constitutional: He is oriented to person, place, and time. He appears well-developed.  HENT:  Head: Normocephalic and atraumatic.  Eyes: Conjunctivae and EOM are normal. Pupils are equal, round, and reactive to light.  Neck: Normal range of motion. Neck supple.  Cardiovascular: Normal rate and regular rhythm.   Pulmonary/Chest: Effort normal and breath sounds normal.  Abdominal: Soft. Bowel sounds are normal. He exhibits no distension. There is no tenderness. There is no rebound and no guarding.  Musculoskeletal: He exhibits tenderness. He exhibits no edema.  Right sided shoulder, hip tenderness, no erythema, ROM is intact, no stiffness  Neurological: He is alert and oriented to person, place, and time.  Skin: Skin is warm.    ED Course  Procedures (including critical care time)  Labs Reviewed  RETICULOCYTES - Abnormal; Notable for the following:    RBC. 3.41 (*)    All other  components within normal limits  COMPREHENSIVE METABOLIC PANEL - Abnormal; Notable for the following:    Total Bilirubin 1.9 (*)    All other components within normal limits  CBC WITH DIFFERENTIAL - Abnormal; Notable for the following:    RBC 2.98 (*)    Hemoglobin 9.9 (*)    HCT 26.8 (*)    MCHC 36.9 (*)    Eosinophils Relative 6 (*)    All other components within normal limits  CBC WITH DIFFERENTIAL - Abnormal; Notable for the following:    RBC 2.98  (*)    Hemoglobin 9.5 (*)    HCT 26.6 (*)    Eosinophils Relative 6 (*)    All other components within normal limits  COMPREHENSIVE METABOLIC PANEL - Abnormal; Notable for the following:    Total Bilirubin 2.1 (*)    All other components within normal limits  CBC - Abnormal; Notable for the following:    RBC 2.92 (*)    Hemoglobin 9.4 (*)    HCT 25.9 (*)    MCHC 36.3 (*)    All other components within normal limits  BASIC METABOLIC PANEL  LACTATE DEHYDROGENASE  MAGNESIUM  CREATININE, SERUM  CBC WITH DIFFERENTIAL   Dg Shoulder Right  10/13/2012   *RADIOLOGY REPORT*  Clinical Data: Pain.  Sickle cell crisis  RIGHT SHOULDER - 2+ VIEW  Comparison: 11/10/2011  Findings: Normal alignment and no fracture.  No significant degenerative change.  Sclerotic bony density in the humerus compatible with sickle cell.  IMPRESSION: No acute abnormality.   Original Report Authenticated By: Janeece Riggers, M.D.   Dg Hip Complete Right  10/13/2012   *RADIOLOGY REPORT*  Clinical Data: Sickle cell crisis  RIGHT HIP - COMPLETE 2+ VIEW  Comparison: 02/17/2005  Findings: Total hip replacement on the right in satisfactory position and alignment.  No fracture or complication.  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  Degenerative changes and sclerosis in the SI joints.  IMPRESSION: No acute abnormality.   Original Report Authenticated By: Janeece Riggers, M.D.     1. Sickle cell anemia with pain   2. Anemia   3. History of tobacco abuse   4. Acute chest syndrome(517.3)       MDM  Pt with hx of sickle cell comes in with cc of pain. Exam reveals no concerns for septic joint. Has had complications of sickle cell. Will admit for pain control if the pain doesn't break.  Derwood Kaplan, MD 10/15/12 [phone]

## 2012-10-15 NOTE — Progress Notes (Signed)
Patient ID: [name], male   DOB: 1975-06-24, 37 y.o.   MRN: [id number]  [facility] PROGRESS NOTE  QUANTAVIS OBRYANT FAO:[id number] DOB: 08/08/1975 DOA: 10/13/2012 PCP: Dorrene German, MD  Brief narrative:  37 year old male, with known history of sickle cell disease, anemia, s/p previous blood transfusions, right shoulder avascular necrosis, bilateral hip avascular necrosis, s/p right hip bone graft and subsequent right hip TKA. According to patient, he has had progressive pains in the right shoulder, bilateral lower rib cage, right>left, as well as both hips and legs, in the usual pattern of his pain crises, for the last 2 weeks. As symptoms have persisted/worsened despite utilization of his regular analgesics, he decided to see the doctor today. Usually , he gets his care at Dr Avbuere's office, but he heard of [facility] for some months now, so he went there, was seen by Dr Ashley Royalty, and referred to the ED. He denies fever, chills, abdoinmal pain, diarrhea or vomiting. No cough, although he gets very occasionally mildly SOB.   Active Problems:  Sickle cell pain crisis  - pt clinically improving, responding well to supportive care  - will continue IVF, analgesia as needed  - no need for transfusion at this time as Hg and Hct are stable and at pt's baseline  - will repeat CBC and BMP in AM  - plan d/c in AM  History of tobacco abuse  - cessation discussed in detail  - pt refusing nicotine patch  Consultants:  None Procedures/Studies:  Dg Shoulder Right 10/13/2012  No acute abnormality.  Dg Hip Complete Right 10/13/2012  No acute abnormality.  Antibiotics:  None  Code Status: Full  Family Communication: Pt at bedside  Disposition Plan: Home in AM   Manson Passey, MD  [facility] Pager [phone]  If 7PM-7AM, please contact night-coverage [email or web address] Password [email or web address] 10/15/2012, 12:01 PM   LOS: 2 days   HPI/Subjective: No events  overnight. Pt still feels pain in lower extremities but says it is better compared to yesterday.   Objective: Filed Vitals:   10/14/12 1422 10/14/12 2032 10/15/12 0201 10/15/12 0539  BP: 123/76 112/68 112/70 122/76  Pulse: 73 71 63 57  Temp: 98.7 F (37.1 C) 98.6 F (37 C) 97.8 F (36.6 C) 98.3 F (36.8 C)  TempSrc: Oral Oral Oral Oral  Resp: 16 18 16 16   Height:      Weight:    77.928 kg (171 lb 12.8 oz)  SpO2: 96% 97% 99% 97%    Intake/Output Summary (Last 24 hours) at 10/15/12 1201 Last data filed at 10/15/12 0201  Gross per 24 hour  Intake 1773.75 ml  Output   2450 ml  Net -676.25 ml    Exam:   General:  Pt is alert, follows commands appropriately, not in acute distress  Cardiovascular: Regular rate and rhythm, S1/S2, no murmurs, no rubs, no gallops  Respiratory: Clear to auscultation bilaterally, no wheezing, no crackles, no rhonchi  Abdomen: Soft, non tender, non distended, bowel sounds present, no guarding  Extremities: No edema, pulses DP and PT palpable bilaterally  Neuro: Grossly nonfocal  Data Reviewed: Basic Metabolic Panel:  Recent Labs Lab 10/13/12 1521 10/13/12 1559 10/14/12 0425 10/15/12 0427  NA 136  --  138 137  K 3.8  --  4.1 3.8  CL 103  --  105 104  CO2 24  --  28 25  GLUCOSE 81  --  85 71  BUN 9  --  9 11  CREATININE 0.72 0.72 0.74 0.72  CALCIUM 8.8  --  9.0 8.9  MG  --  1.9  --   --    Liver Function Tests:  Recent Labs Lab 10/14/12 0425 10/15/12 0427  AST 15 19  ALT 9 10  ALKPHOS 75 74  BILITOT 1.9* 2.1*  PROT 7.1 7.2  ALBUMIN 3.9 3.8   No results found for this basename: LIPASE, AMYLASE,  in the last 168 hours No results found for this basename: AMMONIA,  in the last 168 hours CBC:  Recent Labs Lab 10/13/12 1559 10/14/12 0425 10/15/12 0427  WBC 10.3 10.1 9.3  NEUTROABS 6.1 5.0  --   HGB 9.9* 9.5* 9.4*  HCT 26.8* 26.6* 25.9*  MCV 89.9 89.3 88.7  PLT 287 240 251    Studies: Dg Shoulder  Right  10/13/2012   *RADIOLOGY REPORT*  Clinical Data: Pain.  Sickle cell crisis  RIGHT SHOULDER - 2+ VIEW  Comparison: 11/10/2011  Findings: Normal alignment and no fracture.  No significant degenerative change.  Sclerotic bony density in the humerus compatible with sickle cell.  IMPRESSION: No acute abnormality.   Original Report Authenticated By: Janeece Riggers, M.D.   Dg Hip Complete Right  10/13/2012   *RADIOLOGY REPORT*  Clinical Data: Sickle cell crisis  RIGHT HIP - COMPLETE 2+ VIEW  Comparison: 02/17/2005  Findings: Total hip replacement on the right in satisfactory position and alignment.  No fracture or complication.  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  Degenerative changes and sclerosis in the SI joints.  IMPRESSION: No acute abnormality.   Original Report Authenticated By: Janeece Riggers, M.D.    Scheduled Meds: . docusate sodium  100 mg Oral BID  . enoxaparin (LOVENOX) injection  40 mg Subcutaneous Q24H  . folic acid  1 mg Oral Daily  . ibuprofen  800 mg Oral TID WC  . nicotine  14 mg Transdermal Q24H  . sodium chloride  3 mL Intravenous Q12H  . sorbitol  30 mL Oral Daily   Continuous Infusions: . sodium chloride 1,000 mL (10/15/12 0842)

## 2012-10-16 LAB — COMPREHENSIVE METABOLIC PANEL
AST: 20 U/L (ref 0–37)
BUN: 13 mg/dL (ref 6–23)
CO2: 27 mEq/L (ref 19–32)
Calcium: 8.9 mg/dL (ref 8.4–10.5)
Chloride: 105 mEq/L (ref 96–112)
Creatinine, Ser: 0.81 mg/dL (ref 0.50–1.35)
GFR calc Af Amer: >90 mL/min (ref >90)
GFR calc non Af Amer: >90 mL/min (ref >90)
Glucose, Bld: 85 mg/dL (ref 70–99)
Total Bilirubin: 2.2 mg/dL — ABNORMAL HIGH (ref 0.3–1.2)

## 2012-10-16 MED ORDER — HYDROXYUREA 500 MG PO CAPS
1000.0000 mg | ORAL_CAPSULE | Freq: Every day | ORAL | Status: DC
  Administered 2012-10-16 – 2012-10-21 (×6): 1000 mg via ORAL
  Filled 2012-10-16 (×6): qty 2

## 2012-10-16 MED ORDER — HYDROMORPHONE HCL PF 2 MG/ML IJ SOLN
2.0000 mg | INTRAMUSCULAR | Status: DC | PRN
  Administered 2012-10-16 – 2012-10-18 (×20): 2 mg via INTRAVENOUS
  Filled 2012-10-16 (×20): qty 1

## 2012-10-16 MED ORDER — KETOROLAC TROMETHAMINE 30 MG/ML IJ SOLN
30.0000 mg | Freq: Four times a day (QID) | INTRAMUSCULAR | Status: DC
  Administered 2012-10-16 – 2012-10-21 (×19): 30 mg via INTRAVENOUS
  Filled 2012-10-16 (×26): qty 1

## 2012-10-16 NOTE — Progress Notes (Signed)
[facility] PROGRESS NOTE  [name] XBJ:[id number] DOB: 1975-10-12 DOA: 10/13/2012 PCP: Dorrene German, MD  Brief narrative:  37 year old male, with PMH of sickle cell disease, anemia, s/p previous blood transfusions, right shoulder avascular necrosis, bilateral hip avascular necrosis, s/p right hip bone graft and subsequent right hip TKA. According to patient, he has had progressive pains in the right shoulder, bilateral lower rib cage, right>left, as well as both hips and legs, in the usual pattern of his pain crises, for the last 2 weeks. Usually , he gets his care at Dr Avbuere's office, but he heard of the [facility] for some months now, so he went there, was seen by Dr Ashley Royalty, and referred to the ED.    Active Problems:  Sickle cell pain crisis  -Still reporting severe unresolved pain.  Will continue hypotonic IVF, supportive care. -Increase dilaudid to 2 mg Q 2 hours.  Change Ibuprofen to IV Toradol Q 6 hours ATC. -Not on Hydrea (says he "ran out"), will resume at 1,000 mg/day. -No need for transfusion at this time as Hg and Hct are stable and at pt's baseline.  History of tobacco abuse  -Cessation discussed in detail. -Refusing nicotine patch.  Code Status: Full. Family Communication: None at bedside. Disposition Plan: Home when stable.   Medical Consultants:  None.  Other Consultants:  None.  Anti-infectives:  None.  HPI/Subjective: [name] is complaining of 10/10 pain.  His pain is in the shoulders and flanks.  No N/V.  No dyspnea or cough.  Bowels moving "with help".  Objective: Filed Vitals:   10/15/12 1839 10/15/12 2052 10/16/12 0214 10/16/12 0537  BP: 110/60 118/77 114/63 115/69  Pulse: 66 68 57 56  Temp: 98.7 F (37.1 C) 98.6 F (37 C) 98.2 F (36.8 C) 97.9 F (36.6 C)  TempSrc: Oral Oral Oral Oral  Resp: 18 18 18 18   Height:      Weight:    79.062 kg (174 lb 4.8 oz)  SpO2: 98% 97% 93% 97%    Intake/Output Summary  (Last 24 hours) at 10/16/12 0929 Last data filed at 10/16/12 0725  Gross per 24 hour  Intake   2855 ml  Output   1300 ml  Net   1555 ml    Exam: Gen:  NAD Cardiovascular:  RRR, No M/R/G Respiratory:  Lungs CTAB Gastrointestinal:  Abdomen soft, NT/ND, + BS Extremities:  No C/E/C  Data Reviewed: Basic Metabolic Panel:  Recent Labs Lab 10/13/12 1521 10/13/12 1559 10/14/12 0425 10/15/12 0427 10/16/12 0500  NA 136  --  138 137 138  K 3.8  --  4.1 3.8 4.0  CL 103  --  105 104 105  CO2 24  --  28 25 27   GLUCOSE 81  --  85 71 85  BUN 9  --  9 11 13   CREATININE 0.72 0.72 0.74 0.72 0.81  CALCIUM 8.8  --  9.0 8.9 8.9  MG  --  1.9  --   --   --    GFR Estimated Creatinine Clearance: 139.7 ml/min (by C-G formula based on Cr of 0.81). Liver Function Tests:  Recent Labs Lab 10/14/12 0425 10/15/12 0427 10/16/12 0500  AST 15 19 20   ALT 9 10 10   ALKPHOS 75 74 71  BILITOT 1.9* 2.1* 2.2*  PROT 7.1 7.2 6.9  ALBUMIN 3.9 3.8 3.7    CBC:  Recent Labs Lab 10/13/12 1559 10/14/12 0425 10/15/12 0427  WBC 10.3  10.1 9.3  NEUTROABS 6.1 5.0  --   HGB 9.9* 9.5* 9.4*  HCT 26.8* 26.6* 25.9*  MCV 89.9 89.3 88.7  PLT 287 240 251   BNP (last 3 results)  Recent Labs  11/09/11 0452  PROBNP 401.2*   CBG:  Recent Labs Lab 10/15/12 2100  GLUCAP 78   Anemia work up  Recent Labs  10/13/12 1340  RETICCTPCT 2.8   Microbiology No results found for this or any previous visit (from the past 240 hour(s)).   Procedures and Diagnostic Studies: Dg Shoulder Right  10/13/2012   *RADIOLOGY REPORT*  Clinical Data: Pain.  Sickle cell crisis  RIGHT SHOULDER - 2+ VIEW  Comparison: 11/10/2011  Findings: Normal alignment and no fracture.  No significant degenerative change.  Sclerotic bony density in the humerus compatible with sickle cell.  IMPRESSION: No acute abnormality.   Original Report Authenticated By: Janeece Riggers, M.D.   Dg Hip Complete Right  10/13/2012   *RADIOLOGY REPORT*   Clinical Data: Sickle cell crisis  RIGHT HIP - COMPLETE 2+ VIEW  Comparison: 02/17/2005  Findings: Total hip replacement on the right in satisfactory position and alignment.  No fracture or complication.  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  Degenerative changes and sclerosis in the SI joints.  IMPRESSION: No acute abnormality.   Original Report Authenticated By: Janeece Riggers, M.D.    Scheduled Meds: . docusate sodium  100 mg Oral BID  . enoxaparin (LOVENOX) injection  40 mg Subcutaneous Q24H  . folic acid  1 mg Oral Daily  . ibuprofen  800 mg Oral TID WC  . nicotine  14 mg Transdermal Q24H  . sodium chloride  3 mL Intravenous Q12H  . sorbitol  30 mL Oral Daily   Continuous Infusions: . sodium chloride 75 mL/hr at 10/15/12 2120    Time spent: 25 minutes.   LOS: 3 days   RAMA,CHRISTINA  [facility] Pager [phone].   *Please note that the hospitalists switch teams on Wednesdays. Please call the flow manager at [phone] if you are having difficulty reaching the hospitalist taking care of this patient as she can update you and provide the most up-to-date pager number of provider caring for the patient. If 8PM-8AM, please contact night-coverage at [email or web address], password [facility]  10/16/2012, 9:29 AM

## 2012-10-17 LAB — CBC
HCT: 23.6 % — ABNORMAL LOW (ref 39.0–52.0)
MCH: 32.6 pg (ref 26.0–34.0)
MCV: 88.4 fL (ref 78.0–100.0)
RDW: 14.5 % (ref 11.5–15.5)
WBC: 14.5 10*3/uL — ABNORMAL HIGH (ref 4.0–10.5)

## 2012-10-17 LAB — RETICULOCYTES: Retic Ct Pct: 3.8 % — ABNORMAL HIGH (ref 0.4–3.1)

## 2012-10-17 MED ORDER — MORPHINE SULFATE ER 60 MG PO TBCR
60.0000 mg | EXTENDED_RELEASE_TABLET | Freq: Two times a day (BID) | ORAL | Status: DC
  Administered 2012-10-17 – 2012-10-21 (×8): 60 mg via ORAL
  Filled 2012-10-17 (×8): qty 1

## 2012-10-17 MED ORDER — MORPHINE SULFATE 15 MG PO TABS: 15.0000 mg | ORAL_TABLET | ORAL | Status: DC | PRN

## 2012-10-17 NOTE — Progress Notes (Signed)
Subjective: A 37 year old gentleman with sickle cell painful crisis who has been a patient of Dr. Concepcion Elk but is now interested in becoming a patient of the sickle cell clinic admitted to the teaching service with sickle cell painful crisis. Patient was transferred to our service today because of his desire to be outpatient. He has not established care unit with the sickle cell clinic but he intends to do so after discharge. He has been on Dilaudid 2 mg every 2 hours IV, has had Toradol added as well as hydroxyurea. He has however not been on his home medications including MS Contin and MSIR. Patient is complaining of 8/10 pain today. He however appears functional and able to walk around. No shortness of breath no cough no nausea vomiting or diarrhea.  Objective: Vital signs in last 24 hours: Temp:  [98 F (36.7 C)-98.6 F (37 C)] 98.6 F (37 C) (06/23 1413) Pulse Rate:  [57-65] 65 (06/23 1413) Resp:  [18-20] 18 (06/23 1413) BP: (112-131)/(65-75) 131/71 mmHg (06/23 1413) SpO2:  [95 %-97 %] 96 % (06/23 1413) Weight:  [77.883 kg (171 lb 11.2 oz)] 77.883 kg (171 lb 11.2 oz) (06/23 0500) Weight change: -1.179 kg (-2 lb 9.6 oz) Last BM Date: 10/15/12  Intake/Output from previous day: 06/22 0701 - 06/23 0700 In: 2152 [P.O.:480; I.V.:1672] Out: 4200 [Urine:4200] Intake/Output this shift: Total I/O In: 240 [P.O.:240] Out: 1200 [Urine:1200]  General appearance: alert, cooperative, appears stated age and no distress Eyes: conjunctivae/corneas clear. PERRL, EOM's intact. Fundi benign. Neck: no adenopathy, no carotid bruit, no JVD, supple, symmetrical, trachea midline and thyroid not enlarged, symmetric, no tenderness/mass/nodules Resp: clear to auscultation bilaterally Chest wall: no tenderness Cardio: regular rate and rhythm, S1, S2 normal, no murmur, click, rub or gallop GI: soft, non-tender; bowel sounds normal; no masses,  no organomegaly Extremities: extremities normal, atraumatic, no  cyanosis or edema Pulses: 2+ and symmetric Skin: Skin color, texture, turgor normal. No rashes or lesions Neurologic: Grossly normal  Lab Results:  Recent Labs  10/15/12 0427 10/17/12 0438  WBC 9.3 14.5*  HGB 9.4* 8.7*  HCT 25.9* 23.6*  PLT 251 234   BMET  Recent Labs  10/15/12 0427 10/16/12 0500  NA 137 138  K 3.8 4.0  CL 104 105  CO2 25 27  GLUCOSE 71 85  BUN 11 13  CREATININE 0.72 0.81  CALCIUM 8.9 8.9    Studies/Results: No results found.  Medications: I have reviewed the patient's current medications.  Assessment/Plan: 37 year old gentleman admitted with sickle cell painful crisis.  #1 sickle cell painful crisis: Patient has been slowly getting better but still having a pain of 8/10. He is functional him moving around. I have discussed the details of treatment with him with the strategy involved. I will restart his home medications today. I'll keep him on the Dilaudid 2 mg overnight. I've asked him to mobilize himself. I'll continue with his hydroxyurea the Toradol as well as folic acid. We will aim at discharging the patient within the next 24-48 hours.  #2 tobacco abuse: Patient has been counseled extensively. He is a nicotine patch here.  #3 sickle cell anemia: He dropped his hemoglobin from 9.4-8.7. No evidence of significant major hemolysis. We'll follow closely.  LOS: 4 days   GARBA,[name] 10/17/2012, 4:58 PM

## 2012-10-18 DIAGNOSIS — D5720 Sickle-cell/Hb-C disease without crisis: Secondary | ICD-10-CM

## 2012-10-18 DIAGNOSIS — M87059 Idiopathic aseptic necrosis of unspecified femur: Secondary | ICD-10-CM

## 2012-10-18 DIAGNOSIS — F329 Major depressive disorder, single episode, unspecified: Secondary | ICD-10-CM

## 2012-10-18 LAB — CBC WITH DIFFERENTIAL/PLATELET
Basophils Relative: 1 % (ref 0–1)
Eosinophils Absolute: 1.1 10*3/uL — ABNORMAL HIGH (ref 0.0–0.7)
Eosinophils Relative: 12 % — ABNORMAL HIGH (ref 0–5)
Hemoglobin: 8.8 g/dL — ABNORMAL LOW (ref 13.0–17.0)
Lymphs Abs: 2.7 10*3/uL (ref 0.7–4.0)
MCH: 31.8 pg (ref 26.0–34.0)
MCHC: 35.8 g/dL (ref 30.0–36.0)
MCV: 88.8 fL (ref 78.0–100.0)
Monocytes Relative: 12 % (ref 3–12)
Platelets: 257 10*3/uL (ref 150–400)
RBC: 2.77 MIL/uL — ABNORMAL LOW (ref 4.22–5.81)

## 2012-10-18 LAB — COMPREHENSIVE METABOLIC PANEL
BUN: 12 mg/dL (ref 6–23)
Calcium: 9.6 mg/dL (ref 8.4–10.5)
GFR calc Af Amer: >90 mL/min (ref >90)
Glucose, Bld: 89 mg/dL (ref 70–99)
Total Protein: 7.8 g/dL (ref 6.0–8.3)

## 2012-10-18 MED ORDER — GABAPENTIN 600 MG PO TABS
300.0000 mg | ORAL_TABLET | Freq: Every day | ORAL | Status: DC
  Filled 2012-10-18: qty 0.5

## 2012-10-18 MED ORDER — POLYETHYLENE GLYCOL 3350 17 G PO PACK
17.0000 g | PACK | Freq: Every day | ORAL | Status: DC | PRN
  Administered 2012-10-18: 17 g via ORAL
  Filled 2012-10-18 (×2): qty 1

## 2012-10-18 MED ORDER — HYDROMORPHONE HCL PF 2 MG/ML IJ SOLN
4.0000 mg | INTRAMUSCULAR | Status: DC
  Administered 2012-10-18 – 2012-10-20 (×24): 4 mg via INTRAVENOUS
  Filled 2012-10-18 (×24): qty 2

## 2012-10-18 MED ORDER — HYDROMORPHONE HCL PF 2 MG/ML IJ SOLN: 4.0000 mg | INTRAMUSCULAR | Status: DC | PRN

## 2012-10-18 MED ORDER — GABAPENTIN 300 MG PO CAPS
300.0000 mg | ORAL_CAPSULE | Freq: Every day | ORAL | Status: DC
  Administered 2012-10-18 – 2012-10-21 (×4): 300 mg via ORAL
  Filled 2012-10-18 (×4): qty 1

## 2012-10-18 MED ORDER — SENNOSIDES-DOCUSATE SODIUM 8.6-50 MG PO TABS
1.0000 | ORAL_TABLET | Freq: Two times a day (BID) | ORAL | Status: DC
  Administered 2012-10-18 – 2012-10-19 (×3): 1 via ORAL
  Filled 2012-10-18 (×4): qty 1

## 2012-10-18 NOTE — Care Management Note (Addendum)
Patient: NEEMA FLUEGGE DOB :12/05/1975 MRN :[id number]  Date: 10/18/2012  Documentation Initiated by : Jefm Miles  Subjective/Objective Assessment: Mr. Dralle is a 37 year old male with SCD currently inpatient due to [facility] pain crisis. Mr. Noon stated he is interested in changing PCP. Mr. Furr stated he called [facility] to avoid going to ED. This CM instructed Mr. Olivar to use the day hospital he must be a patient of Dr. Ashley Royalty. This CM explained that Dr.[name] is a PCP.     Barriers to Care: Patient thinking of changing PCP Prior Approval (PA) #:NA PA start date:NA PA end date:  NA  Action/Plan: This CM provided Mr. Gorka with potential new patient packet and explained the day hospital availability. This CM explained prescription refill/policy and advised Mr. Tolbert to look over all paper work. This CM will call Mr. Mahler back to allow time to complete the packet and to answer any questions he may have.    4:16 pm This CM spoke with Mr. Callicott who stated he has not completed the potential new pt. Packet, CM will check back tomorrow. Comments: No additional questions or concerns at this time.   Time spent: 30 minutes  Karoline Caldwell, RN, BSN, [place]  [phone]

## 2012-10-18 NOTE — Progress Notes (Signed)
SICK[facility] CELL SERVICE PROGRESS NOTE  [name] RUE:[id number] DOB: [birth year] DOA: 10/13/2012 PCP: Dorrene German, MD  Assessment/Plan: Active Problems:   Tobacco abuse   Sickle cell pain crisis   History of tobacco abuse   1. Acute VOC associated with SCD: Pt with [place] disease who has rare episodes of uncontrollable pain. The patient states that his pain currently still poorly controlled. Most of it is localized to the right shoulder with water amount of pain in bilateral hips. He states that the pain in the hips are not really affecting function, but the shoulder pain is such that he is unable to perform ADL's.  I will increase Dilaudid to 4 mg every 2 hours scheduled for the next 24 hours and then reassess for adjustment to therapeutic plan. Pt also to continue Ketoralac and adjunctive heat. He is fully hydrated and I will decrease IVF to prevent risk of over hydration and Acute chest.   2. Depression: Pt had a score of 8 on PHQ-9 which places him in the range of mild depression. I have discussed this with the patient and he does not want to pursue and therapy at this time. He states that he had counseling in the past but did not feel that this was helpful to him. He states however that he is dissatisfied with his life as it is currently and wants to make a positive change.  3. Sickle cell disease, type [place]:Pt is under care of Dr. Concepcion Elk. I have tried to obtain recent labs from the office but have been unsuccessful. Most recent records show Hb F 3.1%, Hb S 46.7%, Hb A 41%, and Ferritin of 758. Pt on appropriate dose of Folic acid 1 mg daily.  Code Status: Full Code Family Communication: N/A Disposition Plan: Anticipate discharge within 2-3 days  Shaneequa Bahner A.  Pager [phone]. If 7PM-7AM, please contact night-coverage.  10/18/2012, 10:04 AM  LOS: 5 days   Brief narrative: This is a 37 year old male, with known history of sickle cell disease, anemia, s/p previous blood  transfusions, right shoulder avascular necrosis, bilateral hip avascular necrosis, s/p right hip bone graft and subsequent right hip TKA. According to patient, he has had progressive pains in the right shoulder, bilateral lower rib cage, right>left, as well as both hips and legs, in the usual pattern of his pain crises, for the last 2 weeks-10 days. As symptoms have persisted/worsened despite utilization of his regular analgesics, he decided to see the doctor today. Usually , he gets his care at Dr Avbuere's office, but he had heard of the [facility] for some months now, so he went there, and was referred  to the ED. He denies fever, chills, abdoinmal pain, diarrhea or vomiting. No cough, although he gets very occasionally mildly SOB.    Consultants:  None  Procedures:  None  Antibiotics:  None  HPI/Subjective: The patient states that his pain currently still poorly controlled. Most of it is localized to the right shoulder with water amount of pain in bilateral hips. He states that the pain in the hips are not really affecting function, but the shoulder pain is such that he is unable to perform ADL's.  Objective: Filed Vitals:   10/17/12 2043 10/18/12 0153 10/18/12 0457 10/18/12 0551  BP: 128/79 121/84 128/62 122/74  Pulse: 64 58 68 54  Temp: 97.6 F (36.4 C) 98 F (36.7 C) 98.1 F (36.7 C) 97.8 F (36.6 C)  TempSrc: Oral Oral Oral Oral  Resp:  18 18 18 18   Height:      Weight:      SpO2: 100% 96% 100% 98%   Weight change:   Intake/Output Summary (Last 24 hours) at 10/18/12 1004 Last data filed at 10/18/12 0700  Gross per 24 hour  Intake 1309.17 ml  Output   2550 ml  Net -1240.83 ml    General: Alert, awake, oriented x3, in mild distress secondary to pain.  HEENT: /AT PEERL, EOMI, anicteric Heart: Regular rate and rhythm, without murmurs, rubs, gallops.  Lungs: Clear to auscultation, no wheezing or rhonchi noted.  Abdomen: Soft, nontender, nondistended,  positive bowel sounds, no masses no hepatosplenomegaly noted.  Neuro: No focal neurological deficits noted cranial nerves II through XII grossly intact.  Strength functional in bilateral upper and lower extremities. Musculoskeletal: No warm swelling or erythema around joints. Particularly no signs of inflammation around shoulders hips and knees. Psychiatric: Patient alert and oriented x3, good insight and cognition, good recent to remote recall. Depressed affect    Data Reviewed: Basic Metabolic Panel:  Recent Labs Lab 10/13/12 1521 10/13/12 1559 10/14/12 0425 10/15/12 0427 10/16/12 0500 10/18/12 0542  NA 136  --  138 137 138 140  K 3.8  --  4.1 3.8 4.0 4.0  CL 103  --  105 104 105 104  CO2 24  --  28 25 27 29   GLUCOSE 81  --  85 71 85 89  BUN 9  --  9 11 13 12   CREATININE 0.72 0.72 0.74 0.72 0.81 0.83  CALCIUM 8.8  --  9.0 8.9 8.9 9.6  MG  --  1.9  --   --   --   --    Liver Function Tests:  Recent Labs Lab 10/14/12 0425 10/15/12 0427 10/16/12 0500 10/17/12 1015 10/18/12 0542  AST 15 19 20   --  28  ALT 9 10 10   --  14  ALKPHOS 75 74 71  --  79  BILITOT 1.9* 2.1* 2.2* 2.5* 2.6*  PROT 7.1 7.2 6.9  --  7.8  ALBUMIN 3.9 3.8 3.7  --  4.2   No results found for this basename: LIPASE, AMYLASE,  in the last 168 hours No results found for this basename: AMMONIA,  in the last 168 hours CBC:  Recent Labs Lab 10/13/12 1559 10/14/12 0425 10/15/12 0427 10/17/12 0438 10/18/12 0405  WBC 10.3 10.1 9.3 14.5* 9.5  NEUTROABS 6.1 5.0  --   --  4.5  HGB 9.9* 9.5* 9.4* 8.7* 8.8*  HCT 26.8* 26.6* 25.9* 23.6* 24.6*  MCV 89.9 89.3 88.7 88.4 88.8  PLT 287 240 251 234 257   Cardiac Enzymes: No results found for this basename: CKTOTAL, CKMB, CKMBINDEX, TROPONINI,  in the last 168 hours BNP (last 3 results)  Recent Labs  11/09/11 0452  PROBNP 401.2*   CBG:  Recent Labs Lab 10/15/12 2100  GLUCAP 78    No results found for this or any previous visit (from the past 240  hour(s)).   Studies: Dg Shoulder Right  10/13/2012   *RADIOLOGY REPORT*  Clinical Data: Pain.  Sickle cell crisis  RIGHT SHOULDER - 2+ VIEW  Comparison: 11/10/2011  Findings: Normal alignment and no fracture.  No significant degenerative change.  Sclerotic bony density in the humerus compatible with sickle cell.  IMPRESSION: No acute abnormality.   Original Report Authenticated By: Janeece Riggers, M.D.   Dg Hip Complete Right  10/13/2012   *RADIOLOGY REPORT*  Clinical Data: Sickle cell crisis  RIGHT HIP - COMPLETE 2+ VIEW  Comparison: 02/17/2005  Findings: Total hip replacement on the right in satisfactory position and alignment.  No fracture or complication.  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  Degenerative changes and sclerosis in the SI joints.  IMPRESSION: No acute abnormality.   Original Report Authenticated By: Janeece Riggers, M.D.    Scheduled Meds: . enoxaparin (LOVENOX) injection  40 mg Subcutaneous Q24H  . folic acid  1 mg Oral Daily  . hydroxyurea  1,000 mg Oral Daily  . ketorolac  30 mg Intravenous Q6H  . morphine  60 mg Oral Q12H  . nicotine  14 mg Transdermal Q24H  . senna-docusate  1 tablet Oral BID  . sodium chloride  3 mL Intravenous Q12H  . sorbitol  30 mL Oral Daily   Continuous Infusions: . sodium chloride 50 mL/hr at 10/18/12 0404    Active Problems:   Tobacco abuse   Sickle cell pain crisis   History of tobacco abuse   Sickle cell disease, type [place]

## 2012-10-19 MED ORDER — MAGNESIUM CITRATE PO SOLN
1.0000 | Freq: Once | ORAL | Status: AC
  Administered 2012-10-19: 1 via ORAL

## 2012-10-19 MED ORDER — SENNOSIDES-DOCUSATE SODIUM 8.6-50 MG PO TABS
2.0000 | ORAL_TABLET | Freq: Two times a day (BID) | ORAL | Status: DC
  Administered 2012-10-20 – 2012-10-21 (×2): 2 via ORAL
  Filled 2012-10-19 (×4): qty 2

## 2012-10-19 NOTE — Progress Notes (Signed)
Subjective: A 37 yo man with sickle cell painful crisis now slowly improving. Patient's pain is now 5/10 and he is able to walk and do some ADLs without problem. No chest pain, no SOB, no NVD.  Objective: Vital signs in last 24 hours: Temp:  [97.5 F (36.4 C)-98.3 F (36.8 C)] 98 F (36.7 C) (06/25 1810) Pulse Rate:  [72-88] 82 (06/25 1810) Resp:  [16-18] 16 (06/25 1810) BP: (113-134)/(68-90) 130/80 mmHg (06/25 1810) SpO2:  [91 %-96 %] 95 % (06/25 1810) Weight:  [79.289 kg (174 lb 12.8 oz)] 79.289 kg (174 lb 12.8 oz) (06/25 0500) Weight change:  Last BM Date: 10/15/12  Intake/Output from previous day: 06/24 0701 - 06/25 0700 In: 2181.7 [P.O.:980; I.V.:1201.7] Out: 3700 [Urine:3700] Intake/Output this shift: Total I/O In: 240 [P.O.:240] Out: 700 [Urine:700]  General appearance: alert, cooperative, appears stated age and no distress Eyes: conjunctivae/corneas clear. PERRL, EOM's intact. Fundi benign. Back: symmetric, no curvature. ROM normal. No CVA tenderness. Resp: clear to auscultation bilaterally Chest wall: no tenderness Cardio: regular rate and rhythm, S1, S2 normal, no murmur, click, rub or gallop GI: soft, non-tender; bowel sounds normal; no masses,  no organomegaly Extremities: extremities normal, atraumatic, no cyanosis or edema Pulses: 2+ and symmetric Skin: Skin color, texture, turgor normal. No rashes or lesions Neurologic: Grossly normal  Lab Results:  Recent Labs  10/17/12 0438 10/18/12 0405  WBC 14.5* 9.5  HGB 8.7* 8.8*  HCT 23.6* 24.6*  PLT 234 257   BMET  Recent Labs  10/18/12 0542  NA 140  K 4.0  CL 104  CO2 29  GLUCOSE 89  BUN 12  CREATININE 0.83  CALCIUM 9.6    Studies/Results: No results found.  Medications: I have reviewed the patient's current medications.  Assessment/Plan: A 37 YO man with sickle Cell Painful Crisis. He is doing better now. Not back to his baseline but close.  #1 Sickle Cell painful crisis: seems doing  better. Will decrease IV dilaudid dose in preparation for possible discharge tomorrow. He will follow up with the S[facility] clinic to establish care after discharge.  #2 Sickle Cell Anemia: H/H is stable with no evidence of Hemolysis today. Continue to observe.  #3 Tobacco abuse: counseled.    LOS: 6 days   Rishab Stoudt,[name] 10/19/2012, 9:10 PM

## 2012-10-20 MED ORDER — MORPHINE SULFATE 30 MG PO TABS
30.0000 mg | ORAL_TABLET | ORAL | Status: DC
  Administered 2012-10-20 – 2012-10-21 (×7): 30 mg via ORAL
  Filled 2012-10-20 (×7): qty 1

## 2012-10-20 MED ORDER — HYDROMORPHONE HCL PF 2 MG/ML IJ SOLN
2.0000 mg | INTRAMUSCULAR | Status: DC | PRN
  Administered 2012-10-20 – 2012-10-21 (×3): 2 mg via INTRAVENOUS
  Filled 2012-10-20 (×3): qty 1
  Filled 2012-10-20: qty 2

## 2012-10-20 MED ORDER — GABAPENTIN 300 MG PO CAPS: 300.0000 mg | ORAL_CAPSULE | Freq: Every day | ORAL | Status: DC

## 2012-10-21 NOTE — Progress Notes (Signed)
Active Problems:  Tobacco abuse  Sickle cell pain crisis  History of tobacco abuse    1. Acute VOC associated with SCD: At baseline. home oral medication regimen resumed. Discharge home.  2. Depression: Pt had a score of 8 on PHQ-9 which places him in the range of mild depression. I have discussed this with the patient and he does not want to pursue and therapy at this time. He states that he had counseling in the past but did not feel that this was helpful to him. 3. Sickle cell disease, type [place]: Follow up with Dr. Concepcion Elk. Pt on appropriate dose of Folic acid 1 mg daily.    Code Status: Full Code  Family Communication: N/A  Disposition Plan: Anticipate discharge tomorrow.   Subjective: Patient seen, states his pain is back at baseline. Ready to be discharged. He states he has not made up his mind on following up at the sickle cell clinic, therefore he will follow up with his previous physician for now.   Objective: Weight change:   Intake/Output Summary (Last 24 hours) at 10/21/12 4098 Last data filed at 10/21/12 1191  Gross per 24 hour  Intake    960 ml  Output   2715 ml  Net  -1755 ml   BP 104/65  Pulse 83  Temp(Src) 98.1 F (36.7 C) (Oral)  Resp 16  Ht 6\' 2"  (1.88 m)  Wt 79.289 kg (174 lb 12.8 oz)  BMI 22.43 kg/m2  SpO2 91%  General Appearance:    Alert, cooperative, no distress, appears stated age  Head:    Normocephalic, without obvious abnormality, atraumatic  Eyes:    PERRL, conjunctiva/corneas clear       Nose:   Nares normal, septum midline, mucosa normal, no drainage    or sinus tenderness  Throat:   Lips, mucosa, and tongue normal; teeth and gums normal  Neck:   Supple, symmetrical, trachea midline, no adenopathy;       thyroid:  No enlargement/tenderness/nodules; no carotid   bruit or JVD  Back:     Symmetric, no curvature, ROM normal, no CVA tenderness  Lungs:     Clear to auscultation bilaterally, respirations unlabored  Chest wall:    No tenderness  or deformity  Heart:    Regular rate and rhythm, S1 and S2 normal, no murmur, rub   or gallop  Abdomen:     Soft, non-tender, bowel sounds active all four quadrants,    no masses, no organomegaly        Extremities:   Extremities normal, atraumatic, no cyanosis or edema  Pulses:   2+ and symmetric all extremities  Skin:   Skin color, texture, turgor normal, no rashes or lesions  Lymph nodes:   Cervical, supraclavicular, and axillary nodes normal  Neurologic:   CNII-XII intact. Normal strength, sensation and reflexes      throughout    Lab Results: No results found for this basename: NA, K, CL, CO2, GLUCOSE, BUN, CREATININE, CALCIUM, MG, PHOS,  in the last 72 hours No results found for this basename: AST, ALT, ALKPHOS, BILITOT, PROT, ALBUMIN,  in the last 72 hours No results found for this basename: LIPASE, AMYLASE,  in the last 72 hours No results found for this basename: WBC, NEUTROABS, HGB, HCT, MCV, PLT,  in the last 72 hours No results found for this basename: CKTOTAL, CKMB, CKMBINDEX, TROPONINI,  in the last 72 hours No components found with this basename: POCBNP,  No results found for this  basename: DDIMER,  in the last 72 hours No results found for this basename: HGBA1C,  in the last 72 hours No results found for this basename: CHOL, HDL, LDLCALC, TRIG, CHOLHDL, LDLDIRECT,  in the last 72 hours No results found for this basename: TSH, T4TOTAL, FREET3, T3FREE, THYROIDAB,  in the last 72 hours No results found for this basename: VITAMINB12, FOLATE, FERRITIN, TIBC, IRON, RETICCTPCT,  in the last 72 hours  Micro Results: No results found for this or any previous visit (from the past 240 hour(s)).  Studies/Results: Dg Shoulder Right  10/13/2012   *RADIOLOGY REPORT*  Clinical Data: Pain.  Sickle cell crisis  RIGHT SHOULDER - 2+ VIEW  Comparison: 11/10/2011  Findings: Normal alignment and no fracture.  No significant degenerative change.  Sclerotic bony density in the humerus  compatible with sickle cell.  IMPRESSION: No acute abnormality.   Original Report Authenticated By: Janeece Riggers, M.D.   Dg Hip Complete Right  10/13/2012   *RADIOLOGY REPORT*  Clinical Data: Sickle cell crisis  RIGHT HIP - COMPLETE 2+ VIEW  Comparison: 02/17/2005  Findings: Total hip replacement on the right in satisfactory position and alignment.  No fracture or complication.  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  Degenerative changes and sclerosis in the SI joints.  IMPRESSION: No acute abnormality.   Original Report Authenticated By: Janeece Riggers, M.D.   Medications: Scheduled Meds: . enoxaparin (LOVENOX) injection  40 mg Subcutaneous Q24H  . folic acid  1 mg Oral Daily  . gabapentin  300 mg Oral Daily  . hydroxyurea  1,000 mg Oral Daily  . ketorolac  30 mg Intravenous Q6H  . morphine  60 mg Oral Q12H  . morphine  30 mg Oral Q4H  . nicotine  14 mg Transdermal Q24H  . senna-docusate  2 tablet Oral BID  . sodium chloride  3 mL Intravenous Q12H  . sorbitol  30 mL Oral Daily   Continuous Infusions:  PRN Meds:.sodium chloride, bisacodyl, HYDROmorphone (DILAUDID) injection, ondansetron (ZOFRAN) IV, ondansetron, polyethylene glycol, promethazine, sodium chloride  Assessment/Plan: @PROBHOSP @  LOS: 8 days   [name] 10/21/2012, 8:21 AM

## 2012-10-21 NOTE — Progress Notes (Signed)
Pt. Expressed desire to be discharged today and was ready for discharge by 11:30 a.m. Father here to transport home. Rx phoned into pt's home pharmacy. Patient home with father.

## 2012-10-21 NOTE — Discharge Summary (Signed)
DISCHARGE SUMMARY  BARRY FAIRCLOTH  MR#: [id number]  DOB:1975-08-14  Date of Admission: 10/13/2012 Date of Discharge: 10/21/2012  Attending Physician:Romonia Yanik  Patient's WNU:UVOZDGU,YQIHK A, MD  Consults:Treatment Team:  Nehemiah Settle, MD  Discharge Diagnoses: Present on Admission:  . Sickle cell pain crisis . Tobacco abuse      Medication List    TAKE these medications       diclofenac sodium 1 % Gel  Commonly known as:  VOLTAREN  Apply 1 application topically 4 (four) times daily as needed (for joint pain).     folic acid 1 MG tablet  Commonly known as:  FOLVITE  Take 1 mg by mouth daily.     gabapentin 300 MG capsule  Commonly known as:  NEURONTIN  Take 1 capsule (300 mg total) by mouth daily.     morphine 60 MG 12 hr tablet  Commonly known as:  MS CONTIN  Take 1 tablet (60 mg total) by mouth 2 (two) times daily.     morphine 30 MG tablet  Commonly known as:  MSIR  Take 1 tablet (30 mg total) by mouth every 4 (four) hours as needed for pain. Breakthrough pain     nicotine 14 mg/24hr patch  Commonly known as:  NICODERM CQ - dosed in mg/24 hours  Place 1 patch onto the skin daily.     promethazine 25 MG tablet  Commonly known as:  PHENERGAN  Take 25 mg by mouth every 6 (six) hours as needed for nausea.          Hospital Course: Present on Admission:  . Sickle cell pain crisis . Tobacco abuse:  This is a 37 y/o male admitted with sickle cell crisis, usual pain distribution. He was initially treated with IV narcotics that was quickly converted to home PO medications in 48 hours. His pain is back at baseline and he is ready for discharge.    Disposition: Home  Follow-up Appts:   Follow-up with Dr.Avbuere 7 days.   Tests Needing Follow-up: None   Signed: Tynia Wiers 10/21/2012, 8:29 AM

## 2012-10-21 NOTE — Progress Notes (Signed)
SICKLE CELL SERVICE PROGRESS NOTE  [name] JOA:[id number] DOB: February 05, 1976 DOA: 10/13/2012 PCP: Dorrene German, MD  Assessment/Plan: Active Problems:   Tobacco abuse   Sickle cell pain crisis   History of tobacco abuse   1. Acute VOC associated with SCD: Pt doing much better today and has pain only in his shoulder region. Will resume oral medication regimen and give a small rescue dose of IV medication. Pt also to continue Ketoralac and adjunctive heat. He is fully hydrated and I will discontinue IVF. Anticipate discharge home tomorrow.  2. Depression: Pt had a score of 8 on PHQ-9 which places him in the range of mild depression. I have discussed this with the patient and he does not want to pursue and therapy at this time. He states that he had counseling in the past but did not feel that this was helpful to him. He states however that he is dissatisfied with his life as it is currently and wants to make a positive change.  3. Sickle cell disease, type [place]:Pt is under care of Dr. Concepcion Elk. I have tried to obtain recent labs from the office but have been unsuccessful. Most recent records show Hb F 3.1%, Hb S 46.7%, Hb A 41%, and Ferritin of 758. Pt on appropriate dose of Folic acid 1 mg daily.  Code Status: Full Code Family Communication: N/A Disposition Plan: Anticipate discharge tomorrow.  [name],[name]  Pager [phone]. If 7PM-7AM, please contact night-coverage.  10/21/2012, 8:09 AM  LOS: 8 days   Brief narrative: This is a 37 year old male, with known history of sickle cell disease, anemia, s/p previous blood transfusions, right shoulder avascular necrosis, bilateral hip avascular necrosis, s/p right hip bone graft and subsequent right hip TKA. According to patient, he has had progressive pains in the right shoulder, bilateral lower rib cage, right>left, as well as both hips and legs, in the usual pattern of his pain crises, for the last 2 weeks-10 days. As symptoms have  persisted/worsened despite utilization of his regular analgesics, he decided to see the doctor today. Usually , he gets his care at Dr Avbuere's office, but he had heard of the [facility] for some months now, so he went there, and was referred  to the ED. He denies fever, chills, abdoinmal pain, diarrhea or vomiting. No cough, although he gets very occasionally mildly SOB.    Consultants:  None  Procedures:  None  Antibiotics:  None  HPI/Subjective: The patient states that his pain currently is much better controlled and localized to the right shoulder. He rates his pain as 3/10. Objective: Filed Vitals:   10/20/12 1800 10/20/12 2046 10/21/12 0203 10/21/12 0455  BP: 130/70 126/77 116/67 104/65  Pulse: 90 76 74 83  Temp: 98.1 F (36.7 C) 98.1 F (36.7 C) 98.4 F (36.9 C) 98.1 F (36.7 C)  TempSrc: Oral Oral Oral Oral  Resp: 16 16 16 16   Height:      Weight:      SpO2: 93% 91% 90% 91%   Weight change:   Intake/Output Summary (Last 24 hours) at 10/21/12 0809 Last data filed at 10/21/12 0620  Gross per 24 hour  Intake    960 ml  Output   2715 ml  Net  -1755 ml    General: Alert, awake, oriented x3, in nodistress.  HEENT: [employer]/AT PEERL, EOMI, anicteric Heart: Regular rate and rhythm, without murmurs, rubs, gallops.  Lungs: Clear to auscultation, no wheezing or rhonchi noted.  Abdomen: Soft, nontender, nondistended, positive bowel sounds, no masses no hepatosplenomegaly noted.  Neuro: No focal neurological deficits noted cranial nerves II through XII grossly intact.  Strength functional in bilateral upper and lower extremities. Musculoskeletal: No warm swelling or erythema around joints. Particularly no signs of inflammation around shoulders hips and knees.    Data Reviewed: Basic Metabolic Panel:  Recent Labs Lab 10/15/12 0427 10/16/12 0500 10/18/12 0542  NA 137 138 140  K 3.8 4.0 4.0  CL 104 105 104  CO2 25 27 29   GLUCOSE 71 85 89  BUN 11 13 12    CREATININE 0.72 0.81 0.83  CALCIUM 8.9 8.9 9.6   Liver Function Tests:  Recent Labs Lab 10/15/12 0427 10/16/12 0500 10/17/12 1015 10/18/12 0542  AST 19 20  --  28  ALT 10 10  --  14  ALKPHOS 74 71  --  79  BILITOT 2.1* 2.2* 2.5* 2.6*  PROT 7.2 6.9  --  7.8  ALBUMIN 3.8 3.7  --  4.2   No results found for this basename: LIPASE, AMYLASE,  in the last 168 hours No results found for this basename: AMMONIA,  in the last 168 hours CBC:  Recent Labs Lab 10/15/12 0427 10/17/12 0438 10/18/12 0405  WBC 9.3 14.5* 9.5  NEUTROABS  --   --  4.5  HGB 9.4* 8.7* 8.8*  HCT 25.9* 23.6* 24.6*  MCV 88.7 88.4 88.8  PLT 251 234 257   Cardiac Enzymes: No results found for this basename: CKTOTAL, CKMB, CKMBINDEX, TROPONINI,  in the last 168 hours BNP (last 3 results)  Recent Labs  11/09/11 0452  PROBNP 401.2*   CBG:  Recent Labs Lab 10/15/12 2100  GLUCAP 78    No results found for this or any previous visit (from the past 240 hour(s)).   Studies: Dg Shoulder Right  10/13/2012   *RADIOLOGY REPORT*  Clinical Data: Pain.  Sickle cell crisis  RIGHT SHOULDER - 2+ VIEW  Comparison: 11/10/2011  Findings: Normal alignment and no fracture.  No significant degenerative change.  Sclerotic bony density in the humerus compatible with sickle cell.  IMPRESSION: No acute abnormality.   Original Report Authenticated By: Janeece Riggers, M.D.   Dg Hip Complete Right  10/13/2012   *RADIOLOGY REPORT*  Clinical Data: Sickle cell crisis  RIGHT HIP - COMPLETE 2+ VIEW  Comparison: 02/17/2005  Findings: Total hip replacement on the right in satisfactory position and alignment.  No fracture or complication.  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  Degenerative changes and sclerosis in the SI joints.  IMPRESSION: No acute abnormality.   Original Report Authenticated By: Janeece Riggers, M.D.    Scheduled Meds: . enoxaparin (LOVENOX) injection  40 mg Subcutaneous Q24H  . folic acid  1 mg Oral Daily  . gabapentin  300  mg Oral Daily  . hydroxyurea  1,000 mg Oral Daily  . ketorolac  30 mg Intravenous Q6H  . morphine  60 mg Oral Q12H  . morphine  30 mg Oral Q4H  . nicotine  14 mg Transdermal Q24H  . senna-docusate  2 tablet Oral BID  . sodium chloride  3 mL Intravenous Q12H  . sorbitol  30 mL Oral Daily   Continuous Infusions:    Active Problems:   Tobacco abuse   Sickle cell pain crisis   History of tobacco abuse   Sickle cell disease, type [name]

## 2012-10-25 ENCOUNTER — Telehealth (HOSPITAL_COMMUNITY): Payer: Self-pay

## 2012-10-25 NOTE — Telephone Encounter (Signed)
This CM left message for call back regarding new patient packet completion.  Karoline Caldwell, RN, BSN, [place]    [phone]

## 2013-01-16 IMAGING — CR DG CHEST 2V
2 series · 2 of 2 positions shown · non-contrast
Comparison: Two-view chest x-ray and CT angio chest 04/28/2010.
Two-view chest x-ray 08/27/2006 and 08/31/2005.

CLINICAL DATA: Chest pain.  Shortness of breath.  Generalized
weakness and fatigue.  Sickle cell crisis.

CHEST - 2 VIEW 10/10/2010:

[w chest pa]
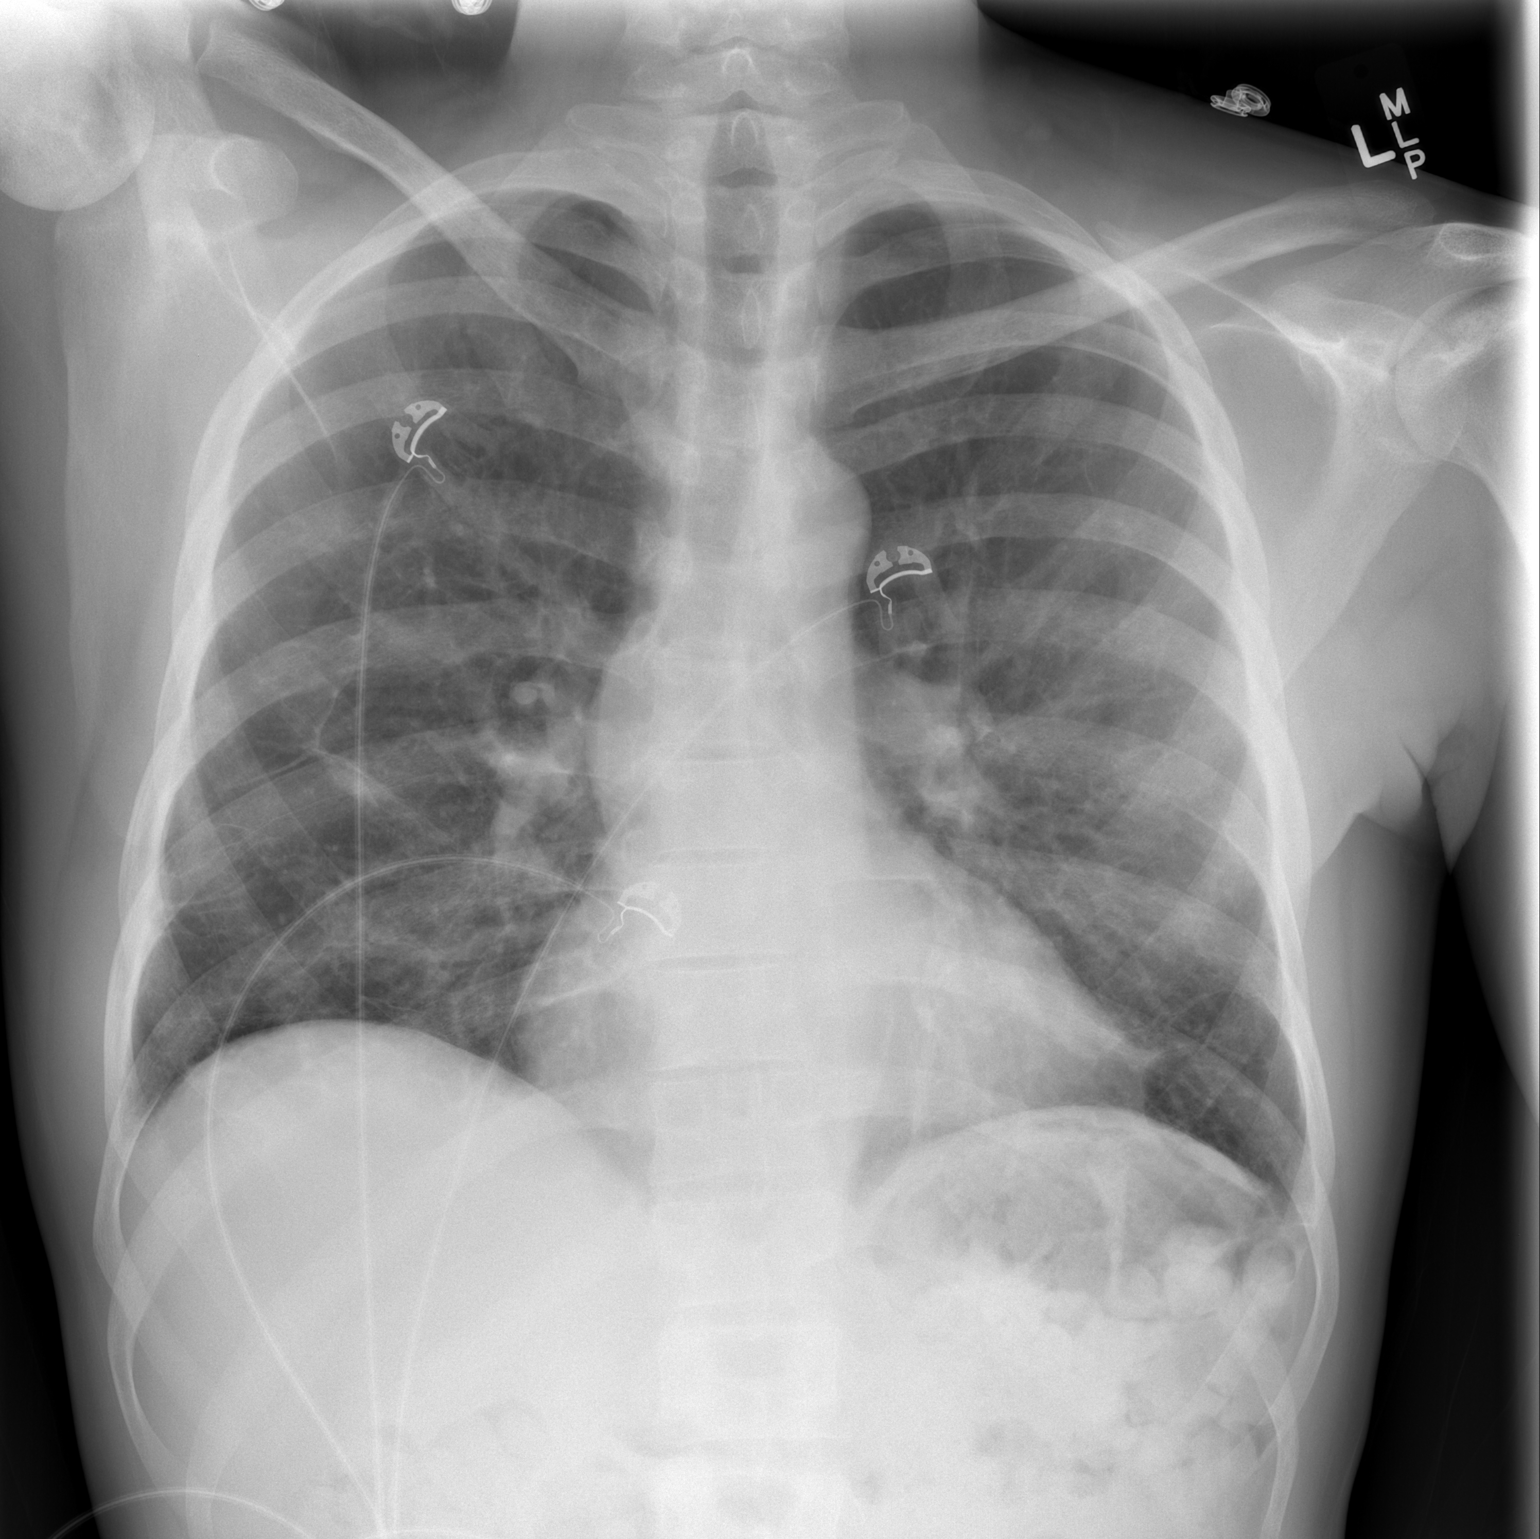

[w chest lat]
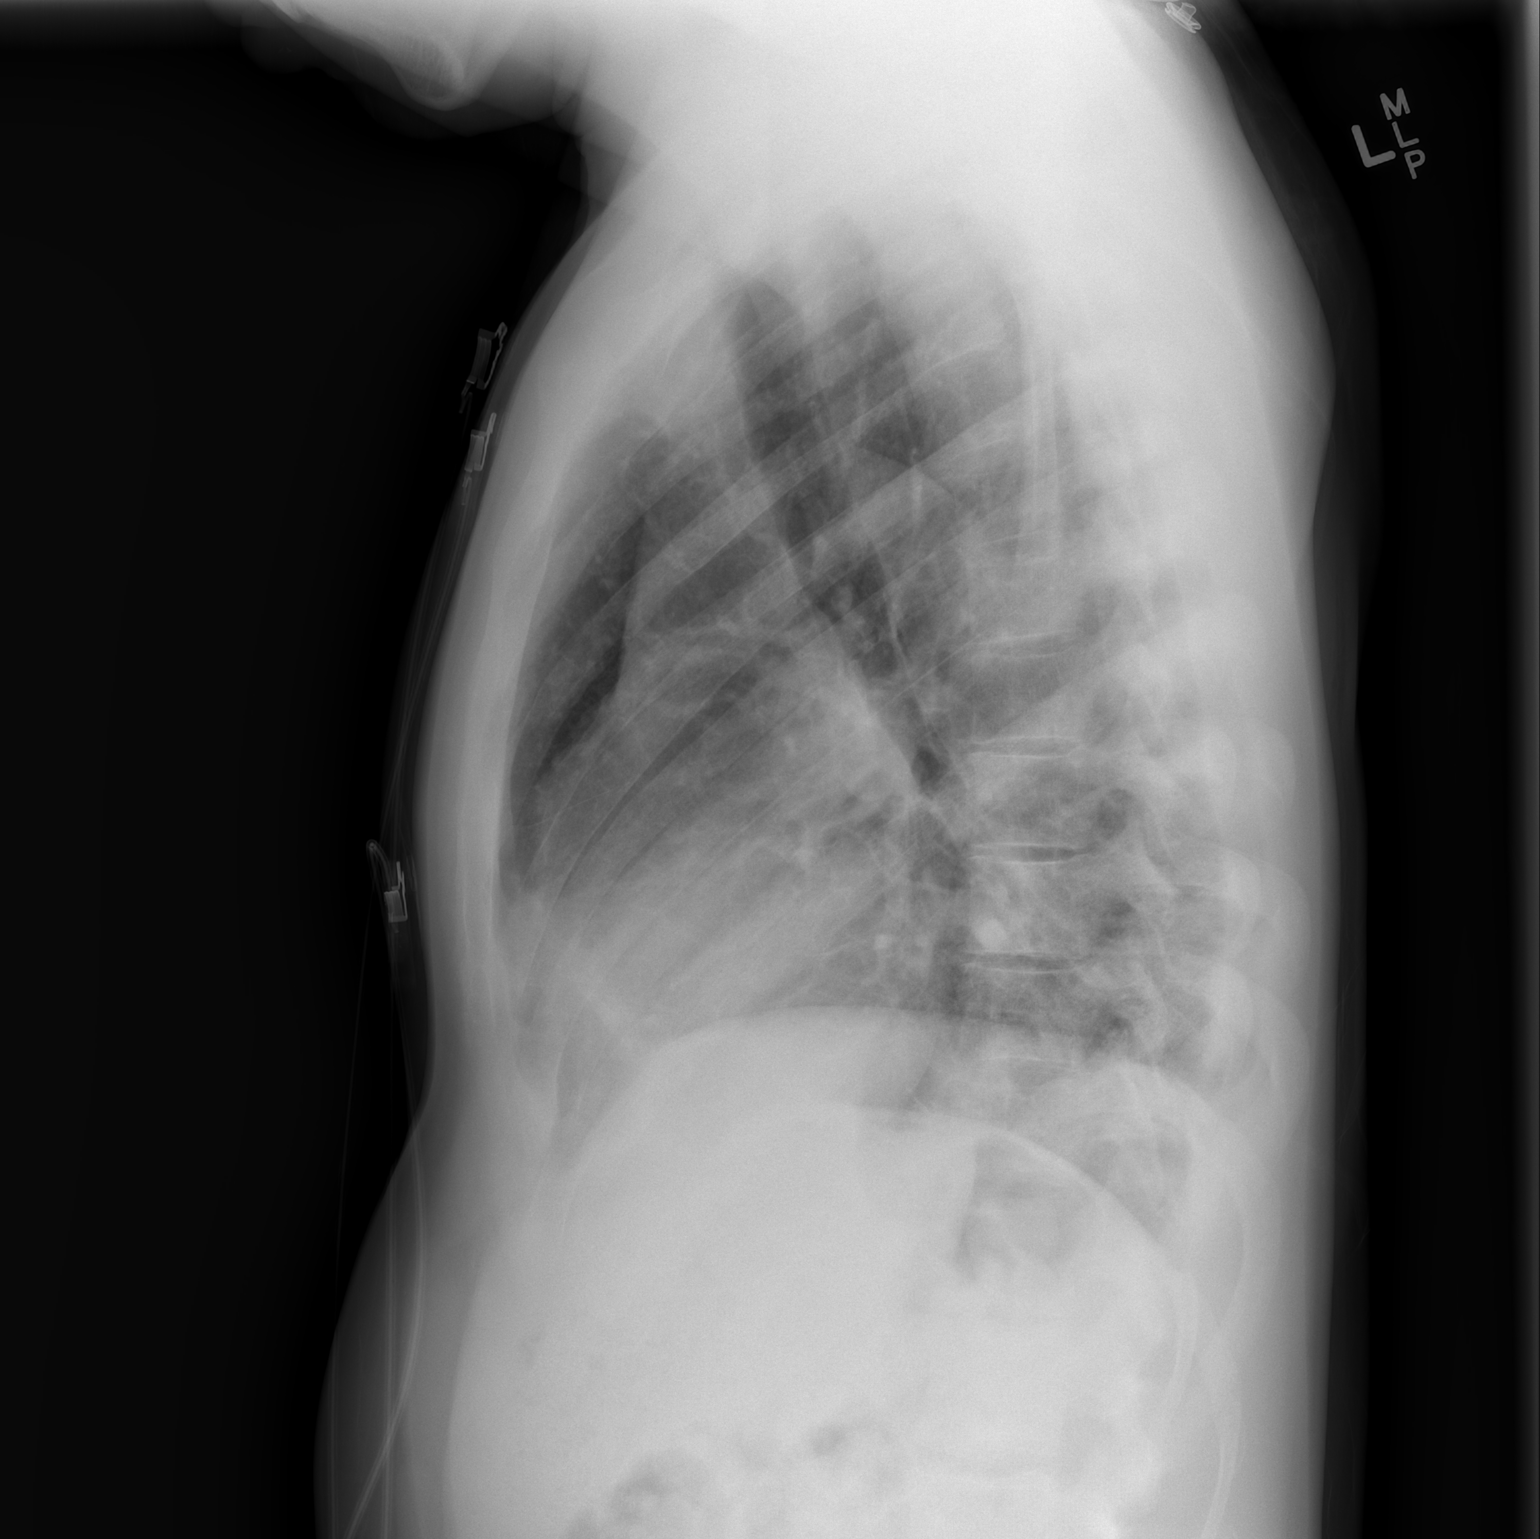

[2 of 2 positions shown; findings below may reference images not displayed]

FINDINGS: Cardiac silhouette mildly enlarged but stable.  Hilar and
mediastinal contours otherwise unremarkable.  Scarring in both
lungs, right greater than left, unchanged.  No new pulmonary
parenchymal abnormalities.  Visualized bony thorax intact.
IMPRESSION: Stable mild cardiomegaly without pulmonary edema.  Scarring in both
lungs, right greater than left.  No acute cardiopulmonary disease.

## 2013-05-24 ENCOUNTER — Encounter (HOSPITAL_COMMUNITY): Payer: Self-pay | Admitting: Emergency Medicine

## 2013-05-24 ENCOUNTER — Inpatient Hospital Stay (HOSPITAL_COMMUNITY)
Admission: EM | Admit: 2013-05-24 | Discharge: 2013-05-28 | DRG: 812 | Disposition: A | Discharge status: Home / Self Care | Payer: Medicare Other | Attending: Internal Medicine | Admitting: Internal Medicine

## 2013-05-24 ENCOUNTER — Emergency Department (HOSPITAL_COMMUNITY): Payer: Medicare Other

## 2013-05-24 DIAGNOSIS — D5700 Hb-SS disease with crisis, unspecified: Principal | ICD-10-CM | POA: Diagnosis present

## 2013-05-24 DIAGNOSIS — Z79899 Other long term (current) drug therapy: Secondary | ICD-10-CM

## 2013-05-24 DIAGNOSIS — D649 Anemia, unspecified: Secondary | ICD-10-CM

## 2013-05-24 DIAGNOSIS — D638 Anemia in other chronic diseases classified elsewhere: Secondary | ICD-10-CM | POA: Diagnosis present

## 2013-05-24 DIAGNOSIS — E876 Hypokalemia: Secondary | ICD-10-CM | POA: Diagnosis present

## 2013-05-24 DIAGNOSIS — Z720 Tobacco use: Secondary | ICD-10-CM

## 2013-05-24 DIAGNOSIS — Z96649 Presence of unspecified artificial hip joint: Secondary | ICD-10-CM

## 2013-05-24 DIAGNOSIS — Z87891 Personal history of nicotine dependence: Secondary | ICD-10-CM

## 2013-05-24 DIAGNOSIS — D72829 Elevated white blood cell count, unspecified: Secondary | ICD-10-CM

## 2013-05-24 LAB — CBC WITH DIFFERENTIAL/PLATELET
BASOS PCT: 1 % (ref 0–1)
Basophils Absolute: 0.1 10*3/uL (ref 0.0–0.1)
EOS ABS: 0.5 10*3/uL (ref 0.0–0.7)
Eosinophils Relative: 5 % (ref 0–5)
HCT: 29.3 % — ABNORMAL LOW (ref 39.0–52.0)
HEMOGLOBIN: 10.8 g/dL — AB (ref 13.0–17.0)
Lymphocytes Relative: 33 % (ref 12–46)
Lymphs Abs: 3.6 10*3/uL (ref 0.7–4.0)
MCH: 33.1 pg (ref 26.0–34.0)
MCHC: 36.9 g/dL — AB (ref 30.0–36.0)
MCV: 89.9 fL (ref 78.0–100.0)
MONO ABS: 1.2 10*3/uL — AB (ref 0.1–1.0)
Monocytes Relative: 11 % (ref 3–12)
NEUTROS PCT: 50 % (ref 43–77)
Neutro Abs: 5.4 10*3/uL (ref 1.7–7.7)
Platelets: 324 10*3/uL (ref 150–400)
RBC: 3.26 MIL/uL — ABNORMAL LOW (ref 4.22–5.81)
RDW: 14.6 % (ref 11.5–15.5)
WBC: 10.8 10*3/uL — ABNORMAL HIGH (ref 4.0–10.5)

## 2013-05-24 LAB — RETICULOCYTES
RBC.: 3.26 MIL/uL — ABNORMAL LOW (ref 4.22–5.81)
Retic Count, Absolute: 71.7 10*3/uL (ref 19.0–186.0)
Retic Ct Pct: 2.2 % (ref 0.4–3.1)

## 2013-05-24 LAB — BASIC METABOLIC PANEL
BUN: 10 mg/dL (ref 6–23)
CALCIUM: 9.6 mg/dL (ref 8.4–10.5)
CO2: 24 meq/L (ref 19–32)
CREATININE: 0.73 mg/dL (ref 0.50–1.35)
Chloride: 101 mEq/L (ref 96–112)
GFR calc Af Amer: >90 mL/min (ref >90)
GFR calc non Af Amer: >90 mL/min (ref >90)
GLUCOSE: 76 mg/dL (ref 70–99)
Potassium: 3.6 mEq/L — ABNORMAL LOW (ref 3.7–5.3)
Sodium: 138 mEq/L (ref 137–147)

## 2013-05-24 MED ORDER — SODIUM CHLORIDE 0.9 % IV BOLUS (SEPSIS)
1000.0000 mL | Freq: Once | INTRAVENOUS | Status: AC
  Administered 2013-05-24: 1000 mL via INTRAVENOUS

## 2013-05-24 MED ORDER — HYDROMORPHONE HCL PF 2 MG/ML IJ SOLN
2.0000 mg | Freq: Once | INTRAMUSCULAR | Status: AC
  Administered 2013-05-24: 2 mg via INTRAVENOUS
  Filled 2013-05-24: qty 1

## 2013-05-24 MED ORDER — ONDANSETRON HCL 4 MG/2ML IJ SOLN
4.0000 mg | Freq: Once | INTRAMUSCULAR | Status: AC
Start: 2013-05-24 — End: 2013-05-24
  Administered 2013-05-24: 4 mg via INTRAVENOUS
  Filled 2013-05-24: qty 2

## 2013-05-24 MED ORDER — KETOROLAC TROMETHAMINE 30 MG/ML IJ SOLN
30.0000 mg | Freq: Once | INTRAMUSCULAR | Status: AC
  Administered 2013-05-24: 30 mg via INTRAVENOUS
  Filled 2013-05-24: qty 1

## 2013-05-24 MED ORDER — DIPHENHYDRAMINE HCL 50 MG/ML IJ SOLN
25.0000 mg | Freq: Once | INTRAMUSCULAR | Status: AC
Start: 2013-05-24 — End: 2013-05-24
  Administered 2013-05-24: 25 mg via INTRAVENOUS
  Filled 2013-05-24: qty 1

## 2013-05-24 NOTE — ED Notes (Signed)
Pt c/o low to mid back pain for 2 days. Pt thinks it is SSC pain. Pt denies recent injury to back. Pt ambulatory to exam room with steady gait.

## 2013-05-24 NOTE — ED Provider Notes (Signed)
CSN: [id number]     Arrival date & time 05/24/13  1650 History   First MD Initiated Contact with Patient 05/24/13 2151     Chief Complaint  Patient presents with  . Back Pain  . Sickle Cell Pain Crisis   (Consider location/radiation/quality/duration/timing/severity/associated sxs/prior Treatment) HPI  Patient to the ER with complaints of low to mid back pain for the past 2 days. He is concerned that this may be related to sickle cell pain. He explains that he has had  Pain crisis to this area before and it is not abnormal for him to have pain to only one area. Nothing makes it worse or better. He typically takes MS Contin at home and it normally helps with his pain but it has not been helpful the last few days. He says that he has not had to come to the ER for pain in months and he only comes when it is really severe. He denies chest pains, shortness or breath, lethargy, confusion, fevers, weakness.   Past Medical History  Diagnosis Date  . Sickle cell crisis   . Blood transfusion   . Avascular necrosis of hip     bilateral  . Infection of bone, shoulder region     left shoulder  . Pneumonia   . Avascular necrosis of hip, left 08/27/2011   Past Surgical History  Procedure Laterality Date  . Orif right hip fracture  1995  . Joint replacement  2006    right total hip arthroplasty   No family history on file. History  Substance Use Topics  . Smoking status: Former Smoker -- 0.50 packs/day    Types: Cigarettes    Quit date: 01/27/2012  . Smokeless tobacco: Never Used  . Alcohol Use: No    Review of Systems The patient denies anorexia, fever, weight loss,, vision loss, decreased hearing, hoarseness, chest pain, syncope, dyspnea on exertion, peripheral edema, balance deficits, hemoptysis, abdominal pain, melena, hematochezia, severe indigestion/heartburn, hematuria, incontinence, genital sores, muscle weakness, suspicious skin lesions, transient blindness, difficulty walking,  depression, unusual weight change, abnormal bleeding, enlarged lymph nodes, angioedema, and breast masses.  Allergies  Review of patient's allergies indicates no known allergies.  Home Medications   Current Outpatient Rx  Name  Route  Sig  Dispense  Refill  . folic acid (FOLVITE) 1 MG tablet   Oral   Take 1 mg by mouth daily.         Marland Kitchen. morphine (MS CONTIN) 60 MG 12 hr tablet   Oral   Take 1 tablet (60 mg total) by mouth 2 (two) times daily.   40 tablet   0   . morphine (MSIR) 30 MG tablet   Oral   Take 1 tablet (30 mg total) by mouth every 4 (four) hours as needed for pain. Breakthrough pain   60 tablet   0   . nicotine (NICODERM CQ - DOSED IN MG/24 HOURS) 14 mg/24hr patch   Transdermal   Place 1 patch onto the skin daily.         . promethazine (PHENERGAN) 25 MG tablet   Oral   Take 25 mg by mouth every 6 (six) hours as needed for nausea.          BP 128/79  Pulse 76  Temp(Src) 98 F (36.7 C) (Oral)  Resp 18  SpO2 98% Physical Exam  Nursing note and vitals reviewed. Constitutional: He appears well-developed and well-nourished. No distress.  HENT:  Head: Normocephalic and atraumatic.  Eyes: Pupils are equal, round, and reactive to light.  Neck: Normal range of motion. Neck supple.  Cardiovascular: Normal rate and regular rhythm.   Pulmonary/Chest: Effort normal.  Abdominal: Soft.  Musculoskeletal:       Lumbar back: He exhibits normal range of motion, no tenderness, no bony tenderness, no swelling, no edema, no deformity, no laceration, no pain, no spasm and normal pulse.       Back:  Unable to reproduce pain with palpation or ROM.  No abnormal findings during physical exam  Neurological: He is alert.  Skin: Skin is warm and dry.    ED Course  Procedures (including critical care time) Labs Review Labs Reviewed  CBC WITH DIFFERENTIAL - Abnormal; Notable for the following:    WBC 10.8 (*)    RBC 3.26 (*)    Hemoglobin 10.8 (*)    HCT 29.3 (*)      MCHC 36.9 (*)    Monocytes Absolute 1.2 (*)    All other components within normal limits  BASIC METABOLIC PANEL - Abnormal; Notable for the following:    Potassium 3.6 (*)    All other components within normal limits  RETICULOCYTES - Abnormal; Notable for the following:    RBC. 3.26 (*)    All other components within normal limits   Imaging Review Dg Chest 2 View  05/25/2013   CLINICAL DATA:  Sickle-cell crisis  EXAM: CHEST  2 VIEW  COMPARISON:  04/28/2012  FINDINGS: Normal heart size and vascularity. Chronic parenchymal scarring bilaterally. No definite superimposed edema or pneumonia. No effusion or pneumothorax. Trachea midline. Overall stable exam.  IMPRESSION: Chronic scarring.  No superimposed acute process   Electronically Signed   By: Ruel Favors M.D.   On: 05/25/2013 00:04    EKG Interpretation   None       MDM   1. Sickle cell pain crisis      Consistent with cycle cell pain Labs and chest xray ordered Fluids and pain medication ordered   Patient given 2 rounds of 2mg  IV Dilaudid with no relief. He requests to be admitted.  Inpatient, [facility], Team 8, med-surg  Dorthula Matas, [place] 05/25/13 [phone]

## 2013-05-25 DIAGNOSIS — Z87891 Personal history of nicotine dependence: Secondary | ICD-10-CM

## 2013-05-25 DIAGNOSIS — D649 Anemia, unspecified: Secondary | ICD-10-CM

## 2013-05-25 DIAGNOSIS — D5700 Hb-SS disease with crisis, unspecified: Principal | ICD-10-CM

## 2013-05-25 DIAGNOSIS — D72829 Elevated white blood cell count, unspecified: Secondary | ICD-10-CM

## 2013-05-25 DIAGNOSIS — E876 Hypokalemia: Secondary | ICD-10-CM

## 2013-05-25 DIAGNOSIS — F17200 Nicotine dependence, unspecified, uncomplicated: Secondary | ICD-10-CM

## 2013-05-25 LAB — CBC WITH DIFFERENTIAL/PLATELET
Basophils Absolute: 0.1 10*3/uL (ref 0.0–0.1)
Basophils Relative: 1 % (ref 0–1)
EOS ABS: 0.7 10*3/uL (ref 0.0–0.7)
Eosinophils Relative: 7 % — ABNORMAL HIGH (ref 0–5)
HCT: 25.8 % — ABNORMAL LOW (ref 39.0–52.0)
Hemoglobin: 9.4 g/dL — ABNORMAL LOW (ref 13.0–17.0)
LYMPHS ABS: 3.7 10*3/uL (ref 0.7–4.0)
Lymphocytes Relative: 37 % (ref 12–46)
MCH: 33 pg (ref 26.0–34.0)
MCHC: 36.4 g/dL — AB (ref 30.0–36.0)
MCV: 90.5 fL (ref 78.0–100.0)
MONO ABS: 1.1 10*3/uL — AB (ref 0.1–1.0)
Monocytes Relative: 11 % (ref 3–12)
Neutro Abs: 4.3 10*3/uL (ref 1.7–7.7)
Neutrophils Relative %: 44 % (ref 43–77)
PLATELETS: 283 10*3/uL (ref 150–400)
RBC: 2.85 MIL/uL — ABNORMAL LOW (ref 4.22–5.81)
RDW: 14.4 % (ref 11.5–15.5)
WBC: 9.9 10*3/uL (ref 4.0–10.5)

## 2013-05-25 LAB — MAGNESIUM: Magnesium: 2 mg/dL (ref 1.5–2.5)

## 2013-05-25 LAB — BASIC METABOLIC PANEL
BUN: 13 mg/dL (ref 6–23)
CO2: 25 meq/L (ref 19–32)
Calcium: 8.4 mg/dL (ref 8.4–10.5)
Chloride: 102 mEq/L (ref 96–112)
Creatinine, Ser: 1.02 mg/dL (ref 0.50–1.35)
GFR calc Af Amer: >90 mL/min (ref >90)
GLUCOSE: 86 mg/dL (ref 70–99)
Potassium: 3.9 mEq/L (ref 3.7–5.3)
SODIUM: 140 meq/L (ref 137–147)

## 2013-05-25 LAB — RETICULOCYTES
RBC.: 2.85 MIL/uL — ABNORMAL LOW (ref 4.22–5.81)
RETIC CT PCT: 2.6 % (ref 0.4–3.1)
Retic Count, Absolute: 74.1 10*3/uL (ref 19.0–186.0)

## 2013-05-25 MED ORDER — HYDROMORPHONE HCL PF 1 MG/ML IJ SOLN: 1.0000 mg | INTRAMUSCULAR | Status: DC | PRN

## 2013-05-25 MED ORDER — ONDANSETRON HCL 4 MG/2ML IJ SOLN
4.0000 mg | INTRAMUSCULAR | Status: DC | PRN
  Administered 2013-05-25 – 2013-05-27 (×8): 4 mg via INTRAVENOUS
  Filled 2013-05-25 (×9): qty 2

## 2013-05-25 MED ORDER — ONDANSETRON HCL 4 MG/2ML IJ SOLN: 4.0000 mg | Freq: Three times a day (TID) | INTRAMUSCULAR | Status: DC | PRN

## 2013-05-25 MED ORDER — NICOTINE 21 MG/24HR TD PT24
21.0000 mg | MEDICATED_PATCH | Freq: Every day | TRANSDERMAL | Status: DC
  Administered 2013-05-25 – 2013-05-28 (×4): 21 mg via TRANSDERMAL
  Filled 2013-05-25 (×4): qty 1

## 2013-05-25 MED ORDER — HYDROMORPHONE HCL PF 1 MG/ML IJ SOLN
1.0000 mg | INTRAMUSCULAR | Status: DC | PRN
  Administered 2013-05-25 (×3): 1 mg via INTRAVENOUS
  Filled 2013-05-25 (×3): qty 1

## 2013-05-25 MED ORDER — ONDANSETRON HCL 4 MG PO TABS: 4.0000 mg | ORAL_TABLET | ORAL | Status: DC | PRN

## 2013-05-25 MED ORDER — MORPHINE SULFATE ER 15 MG PO TBCR
60.0000 mg | EXTENDED_RELEASE_TABLET | Freq: Two times a day (BID) | ORAL | Status: DC
  Administered 2013-05-25 – 2013-05-28 (×7): 60 mg via ORAL
  Filled 2013-05-25 (×7): qty 4

## 2013-05-25 MED ORDER — KETOROLAC TROMETHAMINE 15 MG/ML IJ SOLN
30.0000 mg | Freq: Four times a day (QID) | INTRAMUSCULAR | Status: DC
  Administered 2013-05-25 – 2013-05-28 (×11): 30 mg via INTRAVENOUS
  Filled 2013-05-25 (×19): qty 2

## 2013-05-25 MED ORDER — HYDROMORPHONE HCL PF 2 MG/ML IJ SOLN
2.0000 mg | INTRAMUSCULAR | Status: DC | PRN
  Administered 2013-05-25 – 2013-05-28 (×28): 2 mg via INTRAVENOUS
  Filled 2013-05-25 (×28): qty 1

## 2013-05-25 MED ORDER — PROMETHAZINE HCL 25 MG/ML IJ SOLN
12.5000 mg | Freq: Four times a day (QID) | INTRAMUSCULAR | Status: DC | PRN
  Administered 2013-05-25 – 2013-05-27 (×5): 25 mg via INTRAVENOUS
  Filled 2013-05-25 (×5): qty 1

## 2013-05-25 MED ORDER — FOLIC ACID 1 MG PO TABS
1.0000 mg | ORAL_TABLET | Freq: Every day | ORAL | Status: DC
  Administered 2013-05-25 – 2013-05-28 (×4): 1 mg via ORAL
  Filled 2013-05-25 (×4): qty 1

## 2013-05-25 MED ORDER — SODIUM CHLORIDE 0.9 % IV SOLN
INTRAVENOUS | Status: DC
  Administered 2013-05-25 – 2013-05-28 (×6): via INTRAVENOUS

## 2013-05-25 MED ORDER — SORBITOL 70 % SOLN
30.0000 mL | Freq: Every day | Status: DC | PRN
  Filled 2013-05-25: qty 30

## 2013-05-25 MED ORDER — ENOXAPARIN SODIUM 40 MG/0.4ML ~~LOC~~ SOLN
40.0000 mg | SUBCUTANEOUS | Status: DC
  Administered 2013-05-26 – 2013-05-28 (×3): 40 mg via SUBCUTANEOUS
  Filled 2013-05-25 (×4): qty 0.4

## 2013-05-25 MED ORDER — HYDROMORPHONE HCL PF 1 MG/ML IJ SOLN
1.0000 mg | Freq: Once | INTRAMUSCULAR | Status: AC
  Administered 2013-05-25: 1 mg via INTRAVENOUS
  Filled 2013-05-25: qty 1

## 2013-05-25 MED ORDER — KETOROLAC TROMETHAMINE 15 MG/ML IJ SOLN
15.0000 mg | Freq: Four times a day (QID) | INTRAMUSCULAR | Status: DC
  Administered 2013-05-25: 15 mg via INTRAVENOUS
  Filled 2013-05-25: qty 1

## 2013-05-25 MED ORDER — ALUM & MAG HYDROXIDE-SIMETH 200-200-20 MG/5ML PO SUSP: 15.0000 mL | ORAL | Status: DC | PRN

## 2013-05-25 MED ORDER — HYDROMORPHONE HCL PF 1 MG/ML IJ SOLN
INTRAMUSCULAR | Status: AC
  Administered 2013-05-25: 1 mg
  Filled 2013-05-25: qty 1

## 2013-05-25 NOTE — Progress Notes (Signed)
[facility] PROGRESS NOTE  Malik PortHoward E [name] WUJ:[id number] DOB: Jan 06, 1976 DOA: 05/24/2013 PCP: Dorrene GermanAVBUERE,EDWIN A, MD  Assessment/Plan: Sickle cell pain crisis Continue IV Dilaudid (dose increased to 2 mg every 2 hours when necessary), IVToradol 30 mg every 6 hours, IV hydration, folate and hydroxyurea. Pain mainly localized over right mid back -Monitor H&H and appears at baseline. -Resume home dose MS Contin  Tobacco abuse Order nicotine patch Counseled on cessation   Diet: Regular DVT prophylaxis: Subcutaneous Lovenox  Code Status: Full code  Family Communication: None at bedside   Disposition Plan: Home once pain improved    Consultants:  None  Procedures:  None  Antibiotics:  None  HPI/Subjective: Discontinued and examined this morning. Complains of pain over right mid back not well controlled on current pain regimen.  Objective: Filed Vitals:   05/25/13 0300  BP: 106/70  Pulse: 64  Temp: 98 F (36.7 C)  Resp: 16   No intake or output data in the 24 hours ending 05/25/13 1337 Filed Weights   05/25/13 0459  Weight: 77.111 kg (170 lb)    Exam:   General: Middle aged male in no acute distress  HEENT: No pallor, moist oral mucosa  Chest: Clear to auscultation bilaterally, no added sounds  CVS: Normal S1-S2, no murmurs or gallop  Abdomen: Soft, nontender, nondistended, bowel sounds present  Extremities: Warm, no edema, tender to palpation over right lateral mid back  CNS: AAO x3    Data Reviewed: Basic Metabolic Panel:  Recent Labs Lab 05/24/13 2245 05/25/13 0450  NA 138 140  K 3.6* 3.9  CL 101 102  CO2 24 25  GLUCOSE 76 86  BUN 10 13  CREATININE 0.73 1.02  CALCIUM 9.6 8.4  MG  --  2.0   Liver Function Tests: No results found for this basename: AST, ALT, ALKPHOS, BILITOT, PROT, ALBUMIN,  in the last 168 hours No results found for this basename: LIPASE, AMYLASE,  in the last 168 hours No results found for this basename:  AMMONIA,  in the last 168 hours CBC:  Recent Labs Lab 05/24/13 2245 05/25/13 0450  WBC 10.8* 9.9  NEUTROABS 5.4 4.3  HGB 10.8* 9.4*  HCT 29.3* 25.8*  MCV 89.9 90.5  PLT 324 283   Cardiac Enzymes: No results found for this basename: CKTOTAL, CKMB, CKMBINDEX, TROPONINI,  in the last 168 hours BNP (last 3 results) No results found for this basename: PROBNP,  in the last 8760 hours CBG: No results found for this basename: GLUCAP,  in the last 168 hours  No results found for this or any previous visit (from the past 240 hour(s)).   Studies: Dg Chest 2 View  05/25/2013   CLINICAL DATA:  Sickle-cell crisis  EXAM: CHEST  2 VIEW  COMPARISON:  04/28/2012  FINDINGS: Normal heart size and vascularity. Chronic parenchymal scarring bilaterally. No definite superimposed edema or pneumonia. No effusion or pneumothorax. Trachea midline. Overall stable exam.  IMPRESSION: Chronic scarring.  No superimposed acute process   Electronically Signed   By: Ruel Favorsrevor  Shick M.D.   On: 05/25/2013 00:04    Scheduled Meds: . enoxaparin (LOVENOX) injection  40 mg Subcutaneous Q24H  . folic acid  1 mg Oral Daily  . ketorolac  30 mg Intravenous Q6H  . morphine  60 mg Oral BID  . nicotine  21 mg Transdermal Daily   Continuous Infusions: . sodium chloride 125 mL/hr at 05/25/13 0228      Time spent: 25 minutes  Eddie North  [facility] Pager [phone] If 7PM-7AM, please contact night-coverage at [email or web address], password [facility] 05/25/2013, 1:37 PM  LOS: 1 day

## 2013-05-25 NOTE — ED Provider Notes (Signed)
Medical screening examination/treatment/procedure(s) were performed by non-physician practitioner and as supervising physician I was immediately available for consultation/collaboration.  EKG Interpretation    Date/Time:    Ventricular Rate:    PR Interval:    QRS Duration:   QT Interval:    QTC Calculation:   R Axis:     Text Interpretation:                Junius ArgyleForrest S Ilda Laskin, MD 05/25/13 1132

## 2013-05-25 NOTE — H&P (Addendum)
[facility] History and Physical  [name]:[id number] DOB: 12-25-1975 DOA: 05/24/2013  Referring physician:  PCP: Dorrene German, MD   Chief Complaint: Pain crisis  HPI: [name] is a 38 y.o. male medical history sickle cell anemia, a partial necrosis of, history of blood transfusions presents to the emergency department with complaints of mid back pain that has progressively worsened over the last 3-4 days, characterized as sharp/stabbing, having a peak intensity of 10/10, progressively worse. He reports that symptoms are similar to previous episodes of sickle cell crisis. He was last admitted for sickle cell crisis in June of 2014. Prior to this presentation he had not been on hydroxyurea. He denies chest pain, shortness of breath, nausea vomiting, fevers, chills, abdominal pain.                                                                                                                                                                                                Review of Systems:  Constitutional:  No weight loss, night sweats, Fevers, chills, fatigue.  HEENT:  No headaches, Difficulty swallowing,Tooth/dental problems,Sore throat,  No sneezing, itching, ear ache, nasal congestion, post nasal drip,  Cardio-vascular:  No chest pain, Orthopnea, PND, swelling in lower extremities, anasarca, dizziness, palpitations  GI:  No heartburn, indigestion, abdominal pain, nausea, vomiting, diarrhea, change in bowel habits, loss of appetite  Resp:  No shortness of breath with exertion or at rest. No excess mucus, no productive cough, No non-productive cough, No coughing up of blood.No change in color of mucus.No wheezing.No chest wall deformity  Skin:  no rash or lesions.  GU:  no dysuria, change in color of urine, no urgency or frequency. No flank pain.  Musculoskeletal:  Patient complained of pain to mid back region.  Psych:  No change in mood or affect. No depression or  anxiety. No memory loss.   Past Medical History  Diagnosis Date  . Sickle cell crisis   . Blood transfusion   . Avascular necrosis of hip     bilateral  . Infection of bone, shoulder region     left shoulder  . Pneumonia   . Avascular necrosis of hip, left 08/27/2011   Past Surgical History  Procedure Laterality Date  . Orif right hip fracture  1995  . Joint replacement  2006    right total hip arthroplasty   Social History:  reports that he quit smoking about 15 months ago. His smoking use included Cigarettes. He smoked 0.50 packs per day. He has never used smokeless tobacco. He reports that he does not drink alcohol or  use illicit drugs.  No Known Allergies  No family history on file.   Prior to Admission medications   Medication Sig Start Date End Date Taking? Authorizing Provider  folic acid (FOLVITE) 1 MG tablet Take 1 mg by mouth daily.   Yes Historical Provider, MD  morphine (MS CONTIN) 60 MG 12 hr tablet Take 1 tablet (60 mg total) by mouth 2 (two) times daily. 11/20/11  Yes Grayce SessionsMichelle P Edwards, NP  morphine (MSIR) 30 MG tablet Take 1 tablet (30 mg total) by mouth every 4 (four) hours as needed for pain. Breakthrough pain 11/20/11  Yes Grayce SessionsMichelle P Edwards, NP  nicotine (NICODERM CQ - DOSED IN MG/24 HOURS) 14 mg/24hr patch Place 1 patch onto the skin daily.   Yes Historical Provider, MD  promethazine (PHENERGAN) 25 MG tablet Take 25 mg by mouth every 6 (six) hours as needed for nausea.   Yes Historical Provider, MD   Physical Exam: Filed Vitals:   05/25/13 0051  BP: 102/49  Pulse: 58  Temp: 98.1 F (36.7 C)  Resp: 18    BP 102/49  Pulse 58  Temp(Src) 98.1 F (36.7 C) (Oral)  Resp 18  SpO2 98%  General:  Appears calm and comfortable Eyes: PERRL, normal lids, irises & conjunctiva ENT: grossly normal hearing, lips & tongue Neck: no LAD, masses or thyromegaly Cardiovascular: RRR, no m/r/g. No LE edema. Telemetry: SR, no arrhythmias  Respiratory: CTA bilaterally,  no w/r/r. Normal respiratory effort. Abdomen: soft, ntnd Skin: no rash or induration seen on limited exam Musculoskeletal: grossly normal tone BUE/BLE Psychiatric: grossly normal mood and affect, speech fluent and appropriate Neurologic: grossly non-focal.          Labs on Admission:  Basic Metabolic Panel:  Recent Labs Lab 05/24/13 2245  NA 138  K 3.6*  CL 101  CO2 24  GLUCOSE 76  BUN 10  CREATININE 0.73  CALCIUM 9.6   Liver Function Tests: No results found for this basename: AST, ALT, ALKPHOS, BILITOT, PROT, ALBUMIN,  in the last 168 hours No results found for this basename: LIPASE, AMYLASE,  in the last 168 hours No results found for this basename: AMMONIA,  in the last 168 hours CBC:  Recent Labs Lab 05/24/13 2245  WBC 10.8*  NEUTROABS 5.4  HGB 10.8*  HCT 29.3*  MCV 89.9  PLT 324   Cardiac Enzymes: No results found for this basename: CKTOTAL, CKMB, CKMBINDEX, TROPONINI,  in the last 168 hours  BNP (last 3 results) No results found for this basename: PROBNP,  in the last 8760 hours CBG: No results found for this basename: GLUCAP,  in the last 168 hours  Radiological Exams on Admission: Dg Chest 2 View  05/25/2013   CLINICAL DATA:  Sickle-cell crisis  EXAM: CHEST  2 VIEW  COMPARISON:  04/28/2012  FINDINGS: Normal heart size and vascularity. Chronic parenchymal scarring bilaterally. No definite superimposed edema or pneumonia. No effusion or pneumothorax. Trachea midline. Overall stable exam.  IMPRESSION: Chronic scarring.  No superimposed acute process   Electronically Signed   By: Ruel Favorsrevor  Shick M.D.   On: 05/25/2013 00:04    EKG: Independently reviewed.   Assessment/Plan Active Problems:   Sickle cell pain crisis   Anemia   Hypokalemia   Sickle cell crisis   1. Probable sickle cell crisis. Will admit to patient to MedSurg, provided IV narcotic analgesics, scheduled IV toradol, IV fluid resuscitation, folate, supportive care. Patient reports been on  hydroxyurea. Labwork otherwise appears stable, hemoglobin of  10.8. Reticulocyte count of 71.7.  2. Chronic anemia. Stable. Will follow 3. History of tobacco abuse. Placed nicotine patch    Code Status: Full Code Disposition Plan: Admit to inpatient service, anticipate pt with require at least 2 nights hospitalizaiton  Time spent: 60 min  Jeralyn Bennett [facility] Pager [phone]

## 2013-05-26 LAB — CBC
HCT: 24 % — ABNORMAL LOW (ref 39.0–52.0)
Hemoglobin: 8.7 g/dL — ABNORMAL LOW (ref 13.0–17.0)
MCH: 32.7 pg (ref 26.0–34.0)
MCHC: 36.3 g/dL — AB (ref 30.0–36.0)
MCV: 90.2 fL (ref 78.0–100.0)
Platelets: 240 10*3/uL (ref 150–400)
RBC: 2.66 MIL/uL — AB (ref 4.22–5.81)
RDW: 15.2 % (ref 11.5–15.5)
WBC: 12.8 10*3/uL — ABNORMAL HIGH (ref 4.0–10.5)

## 2013-05-26 NOTE — Progress Notes (Signed)
[facility] PROGRESS NOTE  [name] ZOX:[id number] DOB: 01/11/1976 DOA: 05/24/2013 PCP: Dorrene German, MD  Assessment/Plan: Sickle cell pain crisis Continue IV Dilaudid (dose increased to 2 mg every 2 hours when necessary), IVToradol 30 mg every 6 hours, IV hydration, folate and hydroxyurea. Pain mainly localized over right mid back. -Resume home dose MS Contin. Symptoms stable on current regimen -Monitor H&H . Slight drop in hemoglobin noted. Continue to monitor   Tobacco abuse  nicotine patch Counseled on cessation   Diet: Regular DVT prophylaxis: Subcutaneous Lovenox  Code Status: Full code  Family Communication: None at bedside   Disposition Plan: Home once pain improved    Consultants:  None  Procedures:  None  Antibiotics:  None  HPI/Subjective: Patient seen and examined this morning. pain over right mid back well controlled on current pain regimen.  Objective: Filed Vitals:   05/26/13 1108  BP: 114/64  Pulse: 75  Temp: 97.3 F (36.3 C)  Resp: 16    Intake/Output Summary (Last 24 hours) at 05/26/13 1321 Last data filed at 05/26/13 1100  Gross per 24 hour  Intake   2860 ml  Output   2675 ml  Net    185 ml   Filed Weights   05/25/13 0459  Weight: 77.111 kg (170 lb)    Exam:   General: Middle aged male in no acute distress  HEENT: No pallor, moist oral mucosa  Chest: Clear to auscultation bilaterally, no added sounds  CVS: Normal S1-S2, no murmurs or gallop  Abdomen: Soft, nontender, nondistended, bowel sounds present  Extremities: Warm, no edema, tender to palpation over right lateral mid back  CNS: AAO x3    Data Reviewed: Basic Metabolic Panel:  Recent Labs Lab 05/24/13 2245 05/25/13 0450  NA 138 140  K 3.6* 3.9  CL 101 102  CO2 24 25  GLUCOSE 76 86  BUN 10 13  CREATININE 0.73 1.02  CALCIUM 9.6 8.4  MG  --  2.0   Liver Function Tests: No results found for this basename: AST, ALT, ALKPHOS, BILITOT,  PROT, ALBUMIN,  in the last 168 hours No results found for this basename: LIPASE, AMYLASE,  in the last 168 hours No results found for this basename: AMMONIA,  in the last 168 hours CBC:  Recent Labs Lab 05/24/13 2245 05/25/13 0450 05/26/13 0420  WBC 10.8* 9.9 12.8*  NEUTROABS 5.4 4.3  --   HGB 10.8* 9.4* 8.7*  HCT 29.3* 25.8* 24.0*  MCV 89.9 90.5 90.2  PLT 324 283 240   Cardiac Enzymes: No results found for this basename: CKTOTAL, CKMB, CKMBINDEX, TROPONINI,  in the last 168 hours BNP (last 3 results) No results found for this basename: PROBNP,  in the last 8760 hours CBG: No results found for this basename: GLUCAP,  in the last 168 hours  No results found for this or any previous visit (from the past 240 hour(s)).   Studies: Dg Chest 2 View  05/25/2013   CLINICAL DATA:  Sickle-cell crisis  EXAM: CHEST  2 VIEW  COMPARISON:  04/28/2012  FINDINGS: Normal heart size and vascularity. Chronic parenchymal scarring bilaterally. No definite superimposed edema or pneumonia. No effusion or pneumothorax. Trachea midline. Overall stable exam.  IMPRESSION: Chronic scarring.  No superimposed acute process   Electronically Signed   By: Ruel Favors M.D.   On: 05/25/2013 00:04    Scheduled Meds: . enoxaparin (LOVENOX) injection  40 mg Subcutaneous Q24H  . folic acid  1 mg Oral Daily  .  ketorolac  30 mg Intravenous Q6H  . morphine  60 mg Oral BID  . nicotine  21 mg Transdermal Daily   Continuous Infusions: . sodium chloride 125 mL/hr at 05/26/13 0109      Time spent: 25 minutes    Lillieanna Tuohy  [facility] Pager [phone] If 7PM-7AM, please contact night-coverage at [email or web address], password [email or web address] 05/26/2013, 1:21 PM  LOS: 2 days

## 2013-05-26 NOTE — Care Management Note (Signed)
    Page 1 of 1   05/26/2013     5:23:31 PM   CARE MANAGEMENT NOTE 05/26/2013  Patient:  [name],[name]   Account Number:  [id number]  Date Initiated:  05/25/2013  Documentation initiated by:  Colleen CanMANNING,Shadeed Colberg  Subjective/Objective Assessment:   DX  SICKLE CELL PAIN     Action/Plan:   CM SPOKE WITH PATIENT. Pains arefor patient to return to his home upon discharge. Has family support   Anticipated DC Date:  05/27/2013   Anticipated DC Plan:  HOME/SELF CARE      DC Planning Services  CM consult      Choice offered to / List presented to:             Status of service:  Completed, signed off Medicare Important Message given?   (If response is "NO", the following Medicare IM given date fields will be blank) Date Medicare IM given:   Date Additional Medicare IM given:    Discharge Disposition:    Per UR Regulation:  Reviewed for med. necessity/level of care/duration of stay  If discussed at Long Length of Stay Meetings, dates discussed:    Comments:

## 2013-05-27 LAB — CBC
HEMATOCRIT: 24.6 % — AB (ref 39.0–52.0)
Hemoglobin: 9.1 g/dL — ABNORMAL LOW (ref 13.0–17.0)
MCH: 33.6 pg (ref 26.0–34.0)
MCHC: 37 g/dL — AB (ref 30.0–36.0)
MCV: 90.8 fL (ref 78.0–100.0)
PLATELETS: 246 10*3/uL (ref 150–400)
RBC: 2.71 MIL/uL — ABNORMAL LOW (ref 4.22–5.81)
RDW: 15.5 % (ref 11.5–15.5)
WBC: 15.8 10*3/uL — AB (ref 4.0–10.5)

## 2013-05-27 LAB — LACTATE DEHYDROGENASE: LDH: 288 U/L — ABNORMAL HIGH (ref 94–250)

## 2013-05-27 MED ORDER — HYDROMORPHONE HCL PF 2 MG/ML IJ SOLN
4.0000 mg | Freq: Once | INTRAMUSCULAR | Status: AC
  Administered 2013-05-27: 4 mg via INTRAVENOUS
  Filled 2013-05-27: qty 2

## 2013-05-27 NOTE — Progress Notes (Signed)
[facility] PROGRESS NOTE  [name] ZOX:[id number] DOB: February 15, 1976 DOA: 05/24/2013 PCP: Dorrene GermanAVBUERE,EDWIN A, MD  Assessment/Plan: Sickle cell pain crisis Continue IV Dilaudid (2 mg every 2 hours when necessary), IVToradol 30 mg every 6 hours, IV hydration, folate and hydroxyurea. Pain mainly localized over right mid back and unchanged from yesterday . -continue  home dose MS Contin.maintain current regimen -Monitor H&H and LDH .    Tobacco abuse  nicotine patch Counseled on cessation   Diet: Regular DVT prophylaxis: Subcutaneous Lovenox  Code Status: Full code  Family Communication: None at bedside   Disposition Plan: Home tomorrow if pain improved    Consultants:  None  Procedures:  None  Antibiotics:  None  HPI/Subjective: Patient seen and examined this morning. pain over right mid back well controlled on current pain regimen.  Objective: Filed Vitals:   05/27/13 1055  BP: 130/79  Pulse: 81  Temp: 97.9 F (36.6 C)  Resp: 18    Intake/Output Summary (Last 24 hours) at 05/27/13 1147 Last data filed at 05/27/13 0945  Gross per 24 hour  Intake 2070.6 ml  Output   4250 ml  Net -2179.4 ml   Filed Weights   05/25/13 0459  Weight: 77.111 kg (170 lb)    Exam:   General: Middle aged male in no acute distress  HEENT: No pallor, moist oral mucosa  Chest: Clear to auscultation bilaterally, no added sounds  CVS: Normal S1-S2, no murmurs or gallop  Abdomen: Soft, nontender, nondistended, bowel sounds present  Extremities: Warm, no edema, tender to palpation over right lateral mid back  CNS: AAO x3    Data Reviewed: Basic Metabolic Panel:  Recent Labs Lab 05/24/13 2245 05/25/13 0450  NA 138 140  K 3.6* 3.9  CL 101 102  CO2 24 25  GLUCOSE 76 86  BUN 10 13  CREATININE 0.73 1.02  CALCIUM 9.6 8.4  MG  --  2.0   Liver Function Tests: No results found for this basename: AST, ALT, ALKPHOS, BILITOT, PROT, ALBUMIN,  in the last 168  hours No results found for this basename: LIPASE, AMYLASE,  in the last 168 hours No results found for this basename: AMMONIA,  in the last 168 hours CBC:  Recent Labs Lab 05/24/13 2245 05/25/13 0450 05/26/13 0420  WBC 10.8* 9.9 12.8*  NEUTROABS 5.4 4.3  --   HGB 10.8* 9.4* 8.7*  HCT 29.3* 25.8* 24.0*  MCV 89.9 90.5 90.2  PLT 324 283 240   Cardiac Enzymes: No results found for this basename: CKTOTAL, CKMB, CKMBINDEX, TROPONINI,  in the last 168 hours BNP (last 3 results) No results found for this basename: PROBNP,  in the last 8760 hours CBG: No results found for this basename: GLUCAP,  in the last 168 hours  No results found for this or any previous visit (from the past 240 hour(s)).   Studies: No results found.  Scheduled Meds: . enoxaparin (LOVENOX) injection  40 mg Subcutaneous Q24H  . folic acid  1 mg Oral Daily  . ketorolac  30 mg Intravenous Q6H  . morphine  60 mg Oral BID  . nicotine  21 mg Transdermal Daily   Continuous Infusions: . sodium chloride 125 mL/hr at 05/27/13 0925      Time spent: 25 minutes    Gamaliel Charney  [facility] Pager [phone] If 7PM-7AM, please contact night-coverage at [email or web address], password [email or web address] 05/27/2013, 11:47 AM  LOS: 3 days

## 2013-05-28 NOTE — Progress Notes (Signed)
Discharged from floor ambulatory. No changes in assessment. [name], [name]

## 2013-05-28 NOTE — Discharge Summary (Addendum)
Physician Discharge Summary  DENCIL CAYSON NWG:[id number] DOB: [birth year] DOA: 05/24/2013  PCP: Dorrene German, MD  Admit date: 05/24/2013 Discharge date: 05/28/2013  Time spent: 40 minutes  Recommendations for Outpatient Follow-up:  1. D/c home with PCP follow up  Discharge Diagnoses:  Principal Problem:   Sickle cell pain crisis  Active Problems:   Anemia   Hypokalemia   Discharge Condition: fair  Diet recommendation: regular  Filed Weights   05/25/13 0459  Weight: 77.111 kg (170 lb)    History of present illness:  [name] is a 38 y.o. male medical history sickle cell anemia, a partial necrosis of, history of blood transfusions presents to the emergency department with complaints of mid back pain that has progressively worsened over the last 3-4 days, characterized as sharp/stabbing, having a peak intensity of 10/10, progressively worse. He reports that symptoms are similar to previous episodes of sickle cell crisis. He was last admitted for sickle cell crisis in June of 2014. Prior to this presentation he had not been on hydroxyurea. He denies chest pain, shortness of breath, nausea vomiting, fevers, chills, abdominal pain.    Hospital Course:   Sickle cell pain crisis  Placed on  IV Dilaudid (2 mg every 2 hours when necessary), IV Toradol 30 mg every 6 hours, IV hydration, folate and hydroxyurea. Pain mainly localized over right mid back .  symptoms now much improved and minimal this morning.  . -continue home dose MS Contin.  Patient instructed to  resume home dose MSIR 30 mg every 6 hr prn  -H&H stable  and LDH of 288 . Patient feels stable to go home. Will follow with his PCP in 1 week.   Tobacco abuse  Quit smoking and is on nicotine patch    Hypokalemia  replenished  Diet: Regular    Code Status: Full code  Family Communication: None at bedside  Disposition Plan: Home   Consultants:  None Procedures:  None Antibiotics:   None     Discharge Exam: Filed Vitals:   05/28/13 0800  BP: 109/71  Pulse: 87  Temp: 98.7 F (37.1 C)  Resp: 18    General: Middle aged male in no acute distress  HEENT: No pallor, moist oral mucosa  Chest: Clear to auscultation bilaterally, no added sounds  CVS: Normal S1-S2, no murmurs or gallop  Abdomen: Soft, nontender, nondistended, bowel sounds present  Extremities: Warm, no edema   CNS: AAO x3  Discharge Instructions     Medication List           folic acid 1 MG tablet  Commonly known as:  FOLVITE  Take 1 mg by mouth daily.     morphine 60 MG 12 hr tablet  Commonly known as:  MS CONTIN  Take 1 tablet (60 mg total) by mouth 2 (two) times daily.     morphine 30 MG tablet  Commonly known as:  MSIR  Take 1 tablet (30 mg total) by mouth every 4 (four) hours as needed for pain. Breakthrough pain     nicotine 14 mg/24hr patch  Commonly known as:  NICODERM CQ - dosed in mg/24 hours  Place 1 patch onto the skin daily.     promethazine 25 MG tablet  Commonly known as:  PHENERGAN  Take 25 mg by mouth every 6 (six) hours as needed for nausea.       No Known Allergies     Follow-up Information   Follow up with Dorrene German, MD  In 1 week.   Specialty:  Internal Medicine   Contact information:   [address] [place] [address] [phone]        The results of significant diagnostics from this hospitalization (including imaging, microbiology, ancillary and laboratory) are listed below for reference.    Significant Diagnostic Studies: Dg Chest 2 View  05/25/2013   CLINICAL DATA:  Sickle-cell crisis  EXAM: CHEST  2 VIEW  COMPARISON:  04/28/2012  FINDINGS: Normal heart size and vascularity. Chronic parenchymal scarring bilaterally. No definite superimposed edema or pneumonia. No effusion or pneumothorax. Trachea midline. Overall stable exam.  IMPRESSION: Chronic scarring.  No superimposed acute process   Electronically Signed   By: Ruel Favorsrevor   Shick M.D.   On: 05/25/2013 00:04    Microbiology: No results found for this or any previous visit (from the past 240 hour(s)).   Labs: Basic Metabolic Panel:  Recent Labs Lab 05/24/13 2245 05/25/13 0450  NA 138 140  K 3.6* 3.9  CL 101 102  CO2 24 25  GLUCOSE 76 86  BUN 10 13  CREATININE 0.73 1.02  CALCIUM 9.6 8.4  MG  --  2.0   Liver Function Tests: No results found for this basename: AST, ALT, ALKPHOS, BILITOT, PROT, ALBUMIN,  in the last 168 hours No results found for this basename: LIPASE, AMYLASE,  in the last 168 hours No results found for this basename: AMMONIA,  in the last 168 hours CBC:  Recent Labs Lab 05/24/13 2245 05/25/13 0450 05/26/13 0420 05/27/13 1250  WBC 10.8* 9.9 12.8* 15.8*  NEUTROABS 5.4 4.3  --   --   HGB 10.8* 9.4* 8.7* 9.1*  HCT 29.3* 25.8* 24.0* 24.6*  MCV 89.9 90.5 90.2 90.8  PLT 324 283 240 246   Cardiac Enzymes: No results found for this basename: CKTOTAL, CKMB, CKMBINDEX, TROPONINI,  in the last 168 hours BNP: BNP (last 3 results) No results found for this basename: PROBNP,  in the last 8760 hours CBG: No results found for this basename: GLUCAP,  in the last 168 hours     Signed:  Jag Lenz  [facility] 05/28/2013, 10:23 AM

## 2013-10-24 ENCOUNTER — Encounter (HOSPITAL_COMMUNITY): Payer: Self-pay | Admitting: Emergency Medicine

## 2013-10-24 ENCOUNTER — Emergency Department (HOSPITAL_COMMUNITY): Payer: Medicare Other

## 2013-10-24 ENCOUNTER — Inpatient Hospital Stay (HOSPITAL_COMMUNITY)
Admission: EM | Admit: 2013-10-24 | Discharge: 2013-10-27 | DRG: 812 | Disposition: A | Discharge status: Home / Self Care | Payer: Medicare Other | Attending: Internal Medicine | Admitting: Internal Medicine

## 2013-10-24 DIAGNOSIS — D5700 Hb-SS disease with crisis, unspecified: Principal | ICD-10-CM | POA: Diagnosis present

## 2013-10-24 DIAGNOSIS — D638 Anemia in other chronic diseases classified elsewhere: Secondary | ICD-10-CM | POA: Diagnosis present

## 2013-10-24 DIAGNOSIS — D588 Other specified hereditary hemolytic anemias: Secondary | ICD-10-CM

## 2013-10-24 DIAGNOSIS — D57219 Sickle-cell/Hb-C disease with crisis, unspecified: Secondary | ICD-10-CM

## 2013-10-24 DIAGNOSIS — D5720 Sickle-cell/Hb-C disease without crisis: Secondary | ICD-10-CM | POA: Diagnosis present

## 2013-10-24 DIAGNOSIS — D5701 Hb-SS disease with acute chest syndrome: Secondary | ICD-10-CM

## 2013-10-24 DIAGNOSIS — Z87891 Personal history of nicotine dependence: Secondary | ICD-10-CM

## 2013-10-24 DIAGNOSIS — Z96649 Presence of unspecified artificial hip joint: Secondary | ICD-10-CM

## 2013-10-24 DIAGNOSIS — D72829 Elevated white blood cell count, unspecified: Secondary | ICD-10-CM | POA: Diagnosis present

## 2013-10-24 DIAGNOSIS — F17200 Nicotine dependence, unspecified, uncomplicated: Secondary | ICD-10-CM

## 2013-10-24 DIAGNOSIS — Z720 Tobacco use: Secondary | ICD-10-CM | POA: Diagnosis present

## 2013-10-24 DIAGNOSIS — M19019 Primary osteoarthritis, unspecified shoulder: Secondary | ICD-10-CM | POA: Diagnosis present

## 2013-10-24 LAB — BASIC METABOLIC PANEL
BUN: 8 mg/dL (ref 6–23)
CHLORIDE: 104 meq/L (ref 96–112)
CO2: 23 mEq/L (ref 19–32)
Calcium: 8.9 mg/dL (ref 8.4–10.5)
Creatinine, Ser: 0.83 mg/dL (ref 0.50–1.35)
GFR calc Af Amer: >90 mL/min (ref >90)
GFR calc non Af Amer: >90 mL/min (ref >90)
Glucose, Bld: 80 mg/dL (ref 70–99)
POTASSIUM: 3.8 meq/L (ref 3.7–5.3)
Sodium: 141 mEq/L (ref 137–147)

## 2013-10-24 LAB — RETICULOCYTES
RBC.: 3.17 MIL/uL — AB (ref 4.22–5.81)
RETIC COUNT ABSOLUTE: 88.8 10*3/uL (ref 19.0–186.0)
RETIC CT PCT: 2.8 % (ref 0.4–3.1)

## 2013-10-24 LAB — CBC WITH DIFFERENTIAL/PLATELET
Basophils Absolute: 0.1 10*3/uL (ref 0.0–0.1)
Basophils Relative: 1 % (ref 0–1)
Eosinophils Absolute: 0.5 10*3/uL (ref 0.0–0.7)
Eosinophils Relative: 4 % (ref 0–5)
HCT: 28.4 % — ABNORMAL LOW (ref 39.0–52.0)
HEMOGLOBIN: 10.4 g/dL — AB (ref 13.0–17.0)
Lymphocytes Relative: 21 % (ref 12–46)
Lymphs Abs: 2.5 10*3/uL (ref 0.7–4.0)
MCH: 32.9 pg (ref 26.0–34.0)
MCHC: 36.6 g/dL — AB (ref 30.0–36.0)
MCV: 89.9 fL (ref 78.0–100.0)
MONOS PCT: 12 % (ref 3–12)
Monocytes Absolute: 1.4 10*3/uL — ABNORMAL HIGH (ref 0.1–1.0)
Neutro Abs: 7.6 10*3/uL (ref 1.7–7.7)
Neutrophils Relative %: 63 % (ref 43–77)
PLATELETS: 292 10*3/uL (ref 150–400)
RBC: 3.16 MIL/uL — AB (ref 4.22–5.81)
RDW: 14.5 % (ref 11.5–15.5)
WBC: 12.1 10*3/uL — ABNORMAL HIGH (ref 4.0–10.5)

## 2013-10-24 MED ORDER — HYDROMORPHONE HCL PF 2 MG/ML IJ SOLN
2.0000 mg | Freq: Once | INTRAMUSCULAR | Status: AC
  Administered 2013-10-24: 2 mg via INTRAVENOUS
  Filled 2013-10-24: qty 1

## 2013-10-24 MED ORDER — SODIUM CHLORIDE 0.9 % IV BOLUS (SEPSIS): 2000.0000 mL | Freq: Once | INTRAVENOUS | Status: DC

## 2013-10-24 MED ORDER — HYDROMORPHONE HCL PF 1 MG/ML IJ SOLN
1.0000 mg | Freq: Once | INTRAMUSCULAR | Status: AC
  Administered 2013-10-24: 1 mg via INTRAVENOUS
  Filled 2013-10-24: qty 1

## 2013-10-24 MED ORDER — SODIUM CHLORIDE 0.9 % IV BOLUS (SEPSIS)
1000.0000 mL | Freq: Once | INTRAVENOUS | Status: AC
  Administered 2013-10-24: 1000 mL via INTRAVENOUS

## 2013-10-24 MED ORDER — SODIUM CHLORIDE 0.9 % IV SOLN
1000.0000 mL | Freq: Once | INTRAVENOUS | Status: AC
  Administered 2013-10-24: 1000 mL via INTRAVENOUS

## 2013-10-24 NOTE — H&P (Signed)
[facility] History and Physical  Malik PortHoward E [name] ZOX:[id number] DOB: 05/21/1975 DOA: 10/24/2013  Referring physician:  PCP: Dorrene GermanAVBUERE,EDWIN A, MD  Specialists:   Chief Complaint:   HPI: [name] is a 38 y.o. male with a history of Sickle Cell [place] disease who presents to the ED with complaints of 3 days of severe 10/10 pain in his right shoulder and along his right side. He reports no relief with his home pain medicine regimen.  He denies any fevers or chills or SOB.       Review of Systems:  Constitutional: No Weight Loss, No Weight Gain, Night Sweats, Fevers, Chills, Fatigue, or Generalized Weakness HEENT: No Headaches, Difficulty Swallowing,Tooth/Dental Problems,Sore Throat,  No Sneezing, Rhinitis, Ear Ache, Nasal Congestion, or Post Nasal Drip,  Cardio-vascular:  +Chest Wall Pain, Orthopnea, PND, Edema in lower extremities, Anasarca, Dizziness, Palpitations  Resp: No Dyspnea, No DOE, No Cough, No Hemoptysis, No Wheezing.    GI: No Heartburn, Indigestion, Abdominal Pain, Nausea, Vomiting, Diarrhea, Change in Bowel Habits,  Loss of Appetite  GU: No Dysuria, Change in Color of Urine, No Urgency or Frequency.  No flank pain.  Musculoskeletal: No Joint Pain or Swelling.  No Decreased Range of Motion. No Back Pain.  Neurologic: No Syncope, No Seizures, Muscle Weakness, Paresthesia, Vision Disturbance or Loss, No Diplopia, No Vertigo, No Difficulty Walking,  Skin: No Rash or Lesions. Psych: No Change in Mood or Affect. No Depression or Anxiety. No Memory loss. No Confusion or Hallucinations   Past Medical History  Diagnosis Date  . Sickle cell crisis   . Blood transfusion   . Avascular necrosis of hip     bilateral  . Infection of bone, shoulder region     left shoulder  . Pneumonia   . Avascular necrosis of hip, left 08/27/2011    Past Surgical History  Procedure Laterality Date  . Orif right hip fracture  1995  . Joint replacement  2006    right total hip arthroplasty      Prior to Admission medications   Medication Sig Start Date End Date Taking? Authorizing Provider  folic acid (FOLVITE) 1 MG tablet Take 1 mg by mouth daily.   Yes Historical Provider, MD  morphine (MS CONTIN) 60 MG 12 hr tablet Take 1 tablet (60 mg total) by mouth 2 (two) times daily. 11/20/11  Yes Grayce SessionsMichelle P Edwards, NP  morphine (MSIR) 30 MG tablet Take 1 tablet (30 mg total) by mouth every 4 (four) hours as needed for pain. Breakthrough pain 11/20/11  Yes Grayce SessionsMichelle P Edwards, NP  Multiple Vitamin (MULTIVITAMIN WITH MINERALS) TABS tablet Take 1 tablet by mouth daily.   Yes Historical Provider, MD  promethazine (PHENERGAN) 25 MG tablet Take 25 mg by mouth every 6 (six) hours as needed for nausea.   Yes Historical Provider, MD    No Known Allergies   Social History:  reports that he quit smoking about 20 months ago. His smoking use included Cigarettes. He smoked 0.50 packs per day. He has never used smokeless tobacco. He reports that he does not drink alcohol or use illicit drugs.     No family history on file.     Physical Exam:  GEN:  Pleasant Well Nourished and Well Developed 38 y.o. African American male examined and in no acute distress; cooperative with exam Filed Vitals:   10/24/13 1722 10/24/13 1850 10/24/13 2257  BP: 124/84 134/86 119/81  Pulse: 87 73 64  Temp:   98.5 F (36.9  C)  TempSrc: Oral  Oral  Resp: 16 18 16   SpO2: 96% 95% 95%   Blood pressure 119/81, pulse 64, temperature 98.5 F (36.9 C), temperature source Oral, resp. rate 16, SpO2 95.00%. PSYCH: He is alert and oriented x4; does not appear anxious does not appear depressed; affect is normal HEENT: Normocephalic and Atraumatic, Mucous membranes pink; PERRLA; EOM intact; Fundi:  Benign;  No scleral icterus, Nares: Patent, Oropharynx: Clear, Fair Dentition, Neck:  FROM, no cervical lymphadenopathy nor thyromegaly or carotid bruit; no JVD; Breasts:: Not examined CHEST WALL: + Tenderness along the Right Lower  Lateral Chest Wall CHEST: Normal respiration, clear to auscultation bilaterally HEART: Regular rate and rhythm; no murmurs rubs or gallops BACK: No kyphosis or scoliosis; no CVA tenderness ABDOMEN: Positive Bowel Sounds, Scaphoid, soft non-tender; no masses, no organomegaly. Rectal Exam: Not done EXTREMITIES: No cyanosis, clubbing or edema; no ulcerations. Genitalia: not examined PULSES: 2+ and symmetric SKIN: Normal hydration no rash or ulceration CNS:  Alert X Oriented X 4 , No Focal Deficits  Vascular: pulses palpable throughout    Labs on Admission:  Basic Metabolic Panel:  Recent Labs Lab 10/24/13 1747  NA 141  K 3.8  CL 104  CO2 23  GLUCOSE 80  BUN 8  CREATININE 0.83  CALCIUM 8.9   Liver Function Tests: No results found for this basename: AST, ALT, ALKPHOS, BILITOT, PROT, ALBUMIN,  in the last 168 hours No results found for this basename: LIPASE, AMYLASE,  in the last 168 hours No results found for this basename: AMMONIA,  in the last 168 hours CBC:  Recent Labs Lab 10/24/13 1747  WBC 12.1*  NEUTROABS 7.6  HGB 10.4*  HCT 28.4*  MCV 89.9  PLT 292   Cardiac Enzymes: No results found for this basename: CKTOTAL, CKMB, CKMBINDEX, TROPONINI,  in the last 168 hours  BNP (last 3 results) No results found for this basename: PROBNP,  in the last 8760 hours CBG: No results found for this basename: GLUCAP,  in the last 168 hours  Radiological Exams on Admission: Dg Chest 2 View  10/24/2013   CLINICAL DATA:  SICKLE CELL PAIN CRISIS  EXAM: CHEST  2 VIEW  COMPARISON:  Prior radiograph from 05/24/2013  FINDINGS: The cardiac and mediastinal silhouettes are stable in size and contour, and remain within normal limits.  The lungs are normally inflated. Chronic scarring within the right mid and lower lung is noted, unchanged. No airspace consolidation, pleural effusion, or pulmonary edema is identified. There is no pneumothorax.  No acute osseous abnormality identified.   IMPRESSION: Chronic scarring.  No active cardiopulmonary disease.   Electronically Signed   By: Rise Mu M.D.   On: 10/24/2013 17:41   Dg Shoulder Right  10/24/2013   CLINICAL DATA:  Right shoulder pain with no known injury  EXAM: RIGHT SHOULDER - 2+ VIEW  COMPARISON:  09/1912  FINDINGS: AP and axillary Y-views show no fracture or dislocation. Diffuse osteosclerosis, mild, is likely related to sickle cell disease.  IMPRESSION: No acute findings. Diffuse osteosclerosis possibly related to known sickle cell. No specific evidence of avascular necrosis, but if this is a concern, inch center MRI of the shoulder.   Electronically Signed   By: Esperanza Heir M.D.   On: 10/24/2013 19:26     EKG: Independently reviewed. Normal Sinus Rhythm No acute changes , rate 80   Assessment/Plan:   38 y.o. male with  Principal Problem:   Sickle cell anemia with crisis Active Problems:  Sickle cell disease, type [place]   Anemia   Leukocytosis   Tobacco abuse   1.  Sickle Cell Crisis and Hx of Sickle Cell [place] disease-   Admit for pain control, IV Dilaudid 2-3 mg ! 2 hrs PRN,  And resume PO pain regimen in AM.    2.  Anemia- due to #1,    3.  Leukocytosis-  Stress Rxn, monitor trend.    4.  Tobacco Abuse-   Quit.    5.  DVT Prophylaxis with Lovenox.        Code Status:    FULL CODE Family Communication:     Disposition Plan:         Time spent:   Ron ParkerJENKINS,Zhyon Antenucci C [facility] Pager [phone]  If 7PM-7AM, please contact night-coverage [email or web address] Password [email or web address] 10/24/2013, 11:10 PM

## 2013-10-24 NOTE — ED Notes (Signed)
Pt to CXR.

## 2013-10-24 NOTE — ED Notes (Signed)
Per pt, states pain in right should and right rib cage which started for 3 days ago-no relief from home meds

## 2013-10-24 NOTE — ED Provider Notes (Signed)
CSN: [id number]     Arrival date & time 10/24/13  1715 History   First MD Initiated Contact with Patient 10/24/13 1730     Chief Complaint  Patient presents with  . Sickle Cell Pain Crisis     (Consider location/radiation/quality/duration/timing/severity/associated sxs/prior Treatment) Patient is a 38 y.o. male presenting with sickle cell pain. The history is provided by the patient.  Sickle Cell Pain Crisis Location:  Upper extremity and chest Severity:  Severe Onset quality:  Sudden Duration:  2 days Similar to previous crisis episodes: yes   Timing:  Constant Progression:  Unchanged Chronicity:  New History of pulmonary emboli: no   Relieved by:  Nothing Ineffective treatments:  Prescription drugs Associated symptoms: no congestion, no priapism and no shortness of breath     Past Medical History  Diagnosis Date  . Sickle cell crisis   . Blood transfusion   . Avascular necrosis of hip     bilateral  . Infection of bone, shoulder region     left shoulder  . Pneumonia   . Avascular necrosis of hip, left 08/27/2011   Past Surgical History  Procedure Laterality Date  . Orif right hip fracture  1995  . Joint replacement  2006    right total hip arthroplasty   No family history on file. History  Substance Use Topics  . Smoking status: Former Smoker -- 0.50 packs/day    Types: Cigarettes    Quit date: 01/27/2012  . Smokeless tobacco: Never Used  . Alcohol Use: No    Review of Systems  HENT: Negative for congestion.   Respiratory: Negative for shortness of breath.       Allergies  Review of patient's allergies indicates no known allergies.  Home Medications   Prior to Admission medications   Medication Sig Start Date End Date Taking? Authorizing Provider  folic acid (FOLVITE) 1 MG tablet Take 1 mg by mouth daily.   Yes Historical Provider, MD  morphine (MS CONTIN) 60 MG 12 hr tablet Take 1 tablet (60 mg total) by mouth 2 (two) times daily. 11/20/11  Yes  Grayce SessionsMichelle P Edwards, NP  morphine (MSIR) 30 MG tablet Take 1 tablet (30 mg total) by mouth every 4 (four) hours as needed for pain. Breakthrough pain 11/20/11  Yes Grayce SessionsMichelle P Edwards, NP  Multiple Vitamin (MULTIVITAMIN WITH MINERALS) TABS tablet Take 1 tablet by mouth daily.   Yes Historical Provider, MD  promethazine (PHENERGAN) 25 MG tablet Take 25 mg by mouth every 6 (six) hours as needed for nausea.   Yes Historical Provider, MD   BP 134/86  Pulse 73  Resp 18  SpO2 95% Physical Exam  ED Course  Procedures (including critical care time) Labs Review Labs Reviewed  CBC WITH DIFFERENTIAL - Abnormal; Notable for the following:    WBC 12.1 (*)    RBC 3.16 (*)    Hemoglobin 10.4 (*)    HCT 28.4 (*)    MCHC 36.6 (*)    Monocytes Absolute 1.4 (*)    All other components within normal limits  RETICULOCYTES - Abnormal; Notable for the following:    RBC. 3.17 (*)    All other components within normal limits  BASIC METABOLIC PANEL  URINALYSIS, ROUTINE W REFLEX MICROSCOPIC    Imaging Review Dg Chest 2 View  10/24/2013   CLINICAL DATA:  SICKLE CELL PAIN CRISIS  EXAM: CHEST  2 VIEW  COMPARISON:  Prior radiograph from 05/24/2013  FINDINGS: The cardiac and mediastinal silhouettes are stable  in size and contour, and remain within normal limits.  The lungs are normally inflated. Chronic scarring within the right mid and lower lung is noted, unchanged. No airspace consolidation, pleural effusion, or pulmonary edema is identified. There is no pneumothorax.  No acute osseous abnormality identified.  IMPRESSION: Chronic scarring.  No active cardiopulmonary disease.   Electronically Signed   By: Rise MuBenjamin  McClintock M.D.   On: 10/24/2013 17:41   Dg Shoulder Right  10/24/2013   CLINICAL DATA:  Right shoulder pain with no known injury  EXAM: RIGHT SHOULDER - 2+ VIEW  COMPARISON:  09/1912  FINDINGS: AP and axillary Y-views show no fracture or dislocation. Diffuse osteosclerosis, mild, is likely related to  sickle cell disease.  IMPRESSION: No acute findings. Diffuse osteosclerosis possibly related to known sickle cell. No specific evidence of avascular necrosis, but if this is a concern, inch center MRI of the shoulder.   Electronically Signed   By: Esperanza Heiraymond  Rubner M.D.   On: 10/24/2013 19:26     EKG Interpretation   Date/Time:  Tuesday October 24 2013 17:23:54 EDT Ventricular Rate:  80 PR Interval:  128 QRS Duration: 78 QT Interval:  390 QTC Calculation: 449 R Axis:   26 Text Interpretation:  Normal sinus rhythm Normal ECG unchanged from prior  Confirmed by Rhunette CroftNANAVATI, MD, Janey GentaANKIT [phone]([id number]) on 10/24/2013 5:32:26 PM      MDM   Final diagnoses:  Sickle cell pain crisis   Pt with hx of sickle cell anemia comes in with right sided chest wall pain and shoulder pain. Joint exam reveals no signs of infection - and the ROM is full and free. Pt has no cough, dib, no hypoxia appreciated, and chest pain is reproducible with palpation. No concerns for PE, ACS, PNA. CXR is clear.  Hx of avascular necrosis. Shoulder x rays is equivocal for AVN.  Plan is to admit patient for pain control, and possible further workup for the shoulder pain if deemed necessary.    Derwood KaplanAnkit Nanavati, MD 10/24/13 2258

## 2013-10-24 NOTE — ED Notes (Signed)
Reassumed charting at this time in EPIC, see downtime form.

## 2013-10-25 DIAGNOSIS — D72829 Elevated white blood cell count, unspecified: Secondary | ICD-10-CM

## 2013-10-25 DIAGNOSIS — Z87891 Personal history of nicotine dependence: Secondary | ICD-10-CM

## 2013-10-25 LAB — URINALYSIS, ROUTINE W REFLEX MICROSCOPIC
BILIRUBIN URINE: NEGATIVE
Glucose, UA: NEGATIVE mg/dL
Hgb urine dipstick: NEGATIVE
Ketones, ur: NEGATIVE mg/dL
LEUKOCYTES UA: NEGATIVE
NITRITE: NEGATIVE
PH: 6 (ref 5.0–8.0)
Protein, ur: NEGATIVE mg/dL
Specific Gravity, Urine: 1.008 (ref 1.005–1.030)
Urobilinogen, UA: 0.2 mg/dL (ref 0.0–1.0)

## 2013-10-25 LAB — BASIC METABOLIC PANEL
BUN: 7 mg/dL (ref 6–23)
CHLORIDE: 104 meq/L (ref 96–112)
CO2: 24 meq/L (ref 19–32)
Calcium: 8.4 mg/dL (ref 8.4–10.5)
Creatinine, Ser: 0.75 mg/dL (ref 0.50–1.35)
GFR calc Af Amer: >90 mL/min (ref >90)
GFR calc non Af Amer: >90 mL/min (ref >90)
Glucose, Bld: 76 mg/dL (ref 70–99)
Potassium: 3.8 mEq/L (ref 3.7–5.3)
Sodium: 140 mEq/L (ref 137–147)

## 2013-10-25 LAB — CBC
HEMATOCRIT: 26.4 % — AB (ref 39.0–52.0)
HEMOGLOBIN: 9.7 g/dL — AB (ref 13.0–17.0)
MCH: 33.1 pg (ref 26.0–34.0)
MCHC: 36.7 g/dL — AB (ref 30.0–36.0)
MCV: 90.1 fL (ref 78.0–100.0)
Platelets: 256 10*3/uL (ref 150–400)
RBC: 2.93 MIL/uL — ABNORMAL LOW (ref 4.22–5.81)
RDW: 14.3 % (ref 11.5–15.5)
WBC: 10.4 10*3/uL (ref 4.0–10.5)

## 2013-10-25 MED ORDER — MORPHINE SULFATE 30 MG PO TABS
30.0000 mg | ORAL_TABLET | ORAL | Status: DC | PRN
  Administered 2013-10-25 – 2013-10-26 (×2): 30 mg via ORAL
  Filled 2013-10-25 (×2): qty 1

## 2013-10-25 MED ORDER — PROMETHAZINE HCL 25 MG/ML IJ SOLN
12.5000 mg | Freq: Once | INTRAMUSCULAR | Status: AC
  Administered 2013-10-25: 12.5 mg via INTRAVENOUS
  Filled 2013-10-25: qty 1

## 2013-10-25 MED ORDER — ONDANSETRON HCL 4 MG/2ML IJ SOLN
4.0000 mg | Freq: Four times a day (QID) | INTRAMUSCULAR | Status: DC | PRN
  Administered 2013-10-25 (×2): 4 mg via INTRAVENOUS
  Filled 2013-10-25 (×4): qty 2

## 2013-10-25 MED ORDER — DIPHENHYDRAMINE HCL 50 MG/ML IJ SOLN: 12.5000 mg | Freq: Four times a day (QID) | INTRAMUSCULAR | Status: DC | PRN

## 2013-10-25 MED ORDER — DIPHENHYDRAMINE HCL 12.5 MG/5ML PO ELIX: 12.5000 mg | ORAL_SOLUTION | Freq: Four times a day (QID) | ORAL | Status: DC | PRN

## 2013-10-25 MED ORDER — ONDANSETRON HCL 4 MG/2ML IJ SOLN
4.0000 mg | Freq: Four times a day (QID) | INTRAMUSCULAR | Status: DC | PRN
  Administered 2013-10-25 – 2013-10-26 (×2): 4 mg via INTRAVENOUS

## 2013-10-25 MED ORDER — SODIUM CHLORIDE 0.9 % IJ SOLN: 9.0000 mL | INTRAMUSCULAR | Status: DC | PRN

## 2013-10-25 MED ORDER — ADULT MULTIVITAMIN W/MINERALS CH
1.0000 | ORAL_TABLET | Freq: Every day | ORAL | Status: DC
  Administered 2013-10-25 – 2013-10-27 (×3): 1 via ORAL
  Filled 2013-10-25 (×3): qty 1

## 2013-10-25 MED ORDER — FOLIC ACID 1 MG PO TABS
1.0000 mg | ORAL_TABLET | Freq: Every day | ORAL | Status: DC
Start: 2013-10-25 — End: 2013-10-27
  Administered 2013-10-25 – 2013-10-27 (×3): 1 mg via ORAL
  Filled 2013-10-25 (×3): qty 1

## 2013-10-25 MED ORDER — ACETAMINOPHEN 650 MG RE SUPP: 650.0000 mg | Freq: Four times a day (QID) | RECTAL | Status: DC | PRN

## 2013-10-25 MED ORDER — ACETAMINOPHEN 325 MG PO TABS: 650.0000 mg | ORAL_TABLET | Freq: Four times a day (QID) | ORAL | Status: DC | PRN

## 2013-10-25 MED ORDER — ENOXAPARIN SODIUM 40 MG/0.4ML ~~LOC~~ SOLN
40.0000 mg | SUBCUTANEOUS | Status: DC
  Administered 2013-10-25 – 2013-10-26 (×2): 40 mg via SUBCUTANEOUS
  Filled 2013-10-25 (×3): qty 0.4

## 2013-10-25 MED ORDER — SODIUM CHLORIDE 0.9 % IV SOLN
INTRAVENOUS | Status: DC
  Administered 2013-10-25 – 2013-10-27 (×5): via INTRAVENOUS

## 2013-10-25 MED ORDER — ONDANSETRON HCL 4 MG PO TABS: 4.0000 mg | ORAL_TABLET | Freq: Four times a day (QID) | ORAL | Status: DC | PRN

## 2013-10-25 MED ORDER — OXYCODONE HCL 5 MG PO TABS
5.0000 mg | ORAL_TABLET | ORAL | Status: DC | PRN
  Administered 2013-10-25: 5 mg via ORAL
  Filled 2013-10-25: qty 1

## 2013-10-25 MED ORDER — KETOROLAC TROMETHAMINE 15 MG/ML IJ SOLN
15.0000 mg | Freq: Four times a day (QID) | INTRAMUSCULAR | Status: DC | PRN
  Administered 2013-10-25: 15 mg via INTRAVENOUS
  Filled 2013-10-25: qty 1

## 2013-10-25 MED ORDER — ALUM & MAG HYDROXIDE-SIMETH 200-200-20 MG/5ML PO SUSP: 30.0000 mL | Freq: Four times a day (QID) | ORAL | Status: DC | PRN

## 2013-10-25 MED ORDER — MORPHINE SULFATE ER 30 MG PO TBCR
60.0000 mg | EXTENDED_RELEASE_TABLET | Freq: Two times a day (BID) | ORAL | Status: DC
  Administered 2013-10-25 – 2013-10-27 (×5): 60 mg via ORAL
  Filled 2013-10-25 (×5): qty 2

## 2013-10-25 MED ORDER — HYDROMORPHONE HCL PF 2 MG/ML IJ SOLN
2.0000 mg | INTRAMUSCULAR | Status: DC | PRN
  Administered 2013-10-25 (×3): 2 mg via INTRAVENOUS
  Administered 2013-10-25: 3 mg via INTRAVENOUS
  Filled 2013-10-25 (×2): qty 1
  Filled 2013-10-25: qty 2
  Filled 2013-10-25: qty 1

## 2013-10-25 MED ORDER — NALOXONE HCL 0.4 MG/ML IJ SOLN: 0.4000 mg | INTRAMUSCULAR | Status: DC | PRN

## 2013-10-25 MED ORDER — HYDROMORPHONE 0.3 MG/ML IV SOLN
INTRAVENOUS | Status: DC
  Administered 2013-10-25: 5.59 mg via INTRAVENOUS
  Administered 2013-10-25: 1.51 mg via INTRAVENOUS
  Administered 2013-10-25: 7.89 mg via INTRAVENOUS
  Administered 2013-10-25 (×2): via INTRAVENOUS
  Administered 2013-10-26: 7.67 mg via INTRAVENOUS
  Administered 2013-10-26: 3.67 mg via INTRAVENOUS
  Administered 2013-10-26: 0.8 mg via INTRAVENOUS
  Administered 2013-10-26: 6.39 mg via INTRAVENOUS
  Filled 2013-10-25 (×5): qty 25

## 2013-10-25 NOTE — Progress Notes (Signed)
Subjective: A 38 yo with history of sickle cell disease admitted with sickle cell painful crisis. Patient is currently on IV Dilaudid 2 mg every 2 hours. He is not on a PCA pump. His pain level is an 8/10 (10 involving his lower back and lower legs. No fever, no nausea vomiting or diarrhea. The pain medicines last 2-3 hours and then the pain is right back.  Objective: Vital signs in last 24 hours: Temp:  [97.6 F (36.4 C)-98.5 F (36.9 C)] 97.9 F (36.6 C) (07/01 0934) Pulse Rate:  [63-104] 63 (07/01 0934) Resp:  [14-18] 16 (07/01 0934) BP: (108-134)/(67-86) 115/78 mmHg (07/01 0934) SpO2:  [92 %-99 %] 92 % (07/01 0934) Weight:  [76.5 kg (168 lb 10.4 oz)] 76.5 kg (168 lb 10.4 oz) (07/01 0029) Weight change:  Last BM Date: 07/24/13  Intake/Output from previous day: 06/30 0701 - 07/01 0700 In: 398.3 [I.V.:398.3] Out: -  Intake/Output this shift: Total I/O In: 200 [P.O.:200] Out: -   General appearance: alert, cooperative and no distress Eyes: conjunctivae/corneas clear. PERRL, EOM's intact. Fundi benign. Neck: no adenopathy, no carotid bruit, no JVD, supple, symmetrical, trachea midline and thyroid not enlarged, symmetric, no tenderness/mass/nodules Back: symmetric, no curvature. ROM normal. No CVA tenderness. Resp: clear to auscultation bilaterally Chest wall: no tenderness Cardio: regular rate and rhythm, S1, S2 normal, no murmur, click, rub or gallop GI: soft, non-tender; bowel sounds normal; no masses,  no organomegaly Extremities: extremities normal, atraumatic, no cyanosis or edema Pulses: 2+ and symmetric Skin: Skin color, texture, turgor normal. No rashes or lesions Neurologic: Grossly normal  Lab Results:  Recent Labs  10/24/13 1747 10/25/13 0538  WBC 12.1* 10.4  HGB 10.4* 9.7*  HCT 28.4* 26.4*  PLT 292 256   BMET  Recent Labs  10/24/13 1747 10/25/13 0538  NA 141 140  K 3.8 3.8  CL 104 104  CO2 23 24  GLUCOSE 80 76  BUN 8 7  CREATININE 0.83 0.75   CALCIUM 8.9 8.4    Studies/Results: Dg Chest 2 View  10/24/2013   CLINICAL DATA:  SICKLE CELL PAIN CRISIS  EXAM: CHEST  2 VIEW  COMPARISON:  Prior radiograph from 05/24/2013  FINDINGS: The cardiac and mediastinal silhouettes are stable in size and contour, and remain within normal limits.  The lungs are normally inflated. Chronic scarring within the right mid and lower lung is noted, unchanged. No airspace consolidation, pleural effusion, or pulmonary edema is identified. There is no pneumothorax.  No acute osseous abnormality identified.  IMPRESSION: Chronic scarring.  No active cardiopulmonary disease.   Electronically Signed   By: Rise MuBenjamin  McClintock M.D.   On: 10/24/2013 17:41   Dg Shoulder Right  10/24/2013   CLINICAL DATA:  Right shoulder pain with no known injury  EXAM: RIGHT SHOULDER - 2+ VIEW  COMPARISON:  09/1912  FINDINGS: AP and axillary Y-views show no fracture or dislocation. Diffuse osteosclerosis, mild, is likely related to sickle cell disease.  IMPRESSION: No acute findings. Diffuse osteosclerosis possibly related to known sickle cell. No specific evidence of avascular necrosis, but if this is a concern, inch center MRI of the shoulder.   Electronically Signed   By: Esperanza Heiraymond  Rubner M.D.   On: 10/24/2013 19:26    Medications: I have reviewed the patient's current medications.  Assessment/Plan: A 38 year old gentleman with known history of sickle cell disease admitted with sickle cell painful crisis.  #1 sickle cell painful crisis: Patient seems to be in pain still. I will stop the  Dilaudid pushes and started on Dilaudid PCA pump. I'll do weight-based dosing and continue to monitor him. I will continue his home therapy including MS Contin and MSIR. Also Toradol and IV fluids will be continued.  #2 right shoulder pain: Secondary to osteoarthrosis and possibly sickle cell crisis. Continue to monitor.  #3 sickle cell anemia: H&H seems stable at this point. Slight decrease may be  due to hemodilution. Continue to monitor patient.  #4 leukocytosis: White count is improved today. Continue to monitor.  #5 tobacco abuse: Patient has been counseled. Nicotine patch offered.  LOS: 1 day   Bartt Gonzaga,[name] 10/25/2013, 12:19 PM

## 2013-10-25 NOTE — Progress Notes (Signed)
We have aggressively been treating patient's pain with minimal relief. The lowest that his pain has come down to is an 8/10. Dr. Mikeal HawthorneGarba paged to review orders and to help address pain concerns.

## 2013-10-25 NOTE — Progress Notes (Signed)
Patient had emesis X1 and is feeling nauseated. Zofran was given at 6p and is every 6 hours. MD on call paged.

## 2013-10-26 LAB — COMPREHENSIVE METABOLIC PANEL
ALT: 12 U/L (ref 0–53)
ANION GAP: 12 (ref 5–15)
AST: 30 U/L (ref 0–37)
Albumin: 4 g/dL (ref 3.5–5.2)
Alkaline Phosphatase: 85 U/L (ref 39–117)
BUN: 6 mg/dL (ref 6–23)
CO2: 25 mEq/L (ref 19–32)
CREATININE: 0.76 mg/dL (ref 0.50–1.35)
Calcium: 8.8 mg/dL (ref 8.4–10.5)
Chloride: 103 mEq/L (ref 96–112)
GFR calc non Af Amer: >90 mL/min (ref >90)
GLUCOSE: 75 mg/dL (ref 70–99)
Potassium: 4.2 mEq/L (ref 3.7–5.3)
SODIUM: 140 meq/L (ref 137–147)
TOTAL PROTEIN: 7.6 g/dL (ref 6.0–8.3)
Total Bilirubin: 3.1 mg/dL — ABNORMAL HIGH (ref 0.3–1.2)

## 2013-10-26 LAB — CBC WITH DIFFERENTIAL/PLATELET
Basophils Absolute: 0.1 10*3/uL (ref 0.0–0.1)
Basophils Relative: 0 % (ref 0–1)
EOS ABS: 0.6 10*3/uL (ref 0.0–0.7)
Eosinophils Relative: 4 % (ref 0–5)
HCT: 26 % — ABNORMAL LOW (ref 39.0–52.0)
Hemoglobin: 9.5 g/dL — ABNORMAL LOW (ref 13.0–17.0)
Lymphocytes Relative: 21 % (ref 12–46)
Lymphs Abs: 3.4 10*3/uL (ref 0.7–4.0)
MCH: 33.5 pg (ref 26.0–34.0)
MCHC: 36.5 g/dL — ABNORMAL HIGH (ref 30.0–36.0)
MCV: 91.5 fL (ref 78.0–100.0)
MONO ABS: 1.5 10*3/uL — AB (ref 0.1–1.0)
Monocytes Relative: 10 % (ref 3–12)
Neutro Abs: 10.5 10*3/uL — ABNORMAL HIGH (ref 1.7–7.7)
Neutrophils Relative %: 65 % (ref 43–77)
PLATELETS: 245 10*3/uL (ref 150–400)
RBC: 2.84 MIL/uL — AB (ref 4.22–5.81)
RDW: 15.1 % (ref 11.5–15.5)
WBC: 16.2 10*3/uL — ABNORMAL HIGH (ref 4.0–10.5)

## 2013-10-26 MED ORDER — PROMETHAZINE HCL 25 MG/ML IJ SOLN
12.5000 mg | Freq: Once | INTRAMUSCULAR | Status: AC
  Administered 2013-10-26: 12.5 mg via INTRAVENOUS
  Filled 2013-10-26: qty 1

## 2013-10-26 MED ORDER — HYDROMORPHONE 0.3 MG/ML IV SOLN
INTRAVENOUS | Status: DC
  Administered 2013-10-26: 2.05 mg via INTRAVENOUS
  Administered 2013-10-26: 4.79 mg via INTRAVENOUS
  Administered 2013-10-26: 5.24 mg via INTRAVENOUS
  Administered 2013-10-27: 5.59 mg via INTRAVENOUS
  Administered 2013-10-27: 2.22 mg via INTRAVENOUS
  Administered 2013-10-27: 7.59 mg via INTRAVENOUS
  Filled 2013-10-26 (×4): qty 25

## 2013-10-26 MED ORDER — MORPHINE SULFATE 30 MG PO TABS: 30.0000 mg | ORAL_TABLET | ORAL | Status: DC | PRN

## 2013-10-26 MED ORDER — HYDROMORPHONE HCL 4 MG PO TABS: 4.0000 mg | ORAL_TABLET | ORAL | Status: DC | PRN

## 2013-10-26 MED ORDER — HYDROMORPHONE 0.3 MG/ML IV SOLN: INTRAVENOUS | Status: DC

## 2013-10-26 NOTE — Progress Notes (Signed)
Subjective: Patient feels better today. His pain is now down to 7/10. He has also been able to walk up and down the hallway. He is on Dilaudid PCA and has used 29.1 mg in the last 24 hours with 49 demands and 38 deliveries. No shortness of breath, no cough no nausea vomiting or diarrhea. He however feels like he is not ready yet to go. His shoulder is still not very much mobile. Objective: Vital signs in last 24 hours: Temp:  [97.9 F (36.6 C)-98.4 F (36.9 C)] 98.4 F (36.9 C) (07/02 1308) Pulse Rate:  [61-74] 65 (07/02 1308) Resp:  [9-18] 12 (07/02 1600) BP: (102-132)/(63-77) 127/77 mmHg (07/02 1308) SpO2:  [90 %-96 %] 95 % (07/02 1600) Weight change:  Last BM Date: 10/24/13  Intake/Output from previous day: 07/01 0701 - 07/02 0700 In: 2881.7 [P.O.:440; I.V.:2441.7] Out: 1750 [Urine:1750] Intake/Output this shift: Total I/O In: -  Out: 1000 [Urine:1000]  General appearance: alert, cooperative and no distress Eyes: conjunctivae/corneas clear. PERRL, EOM's intact. Fundi benign. Neck: no adenopathy, no carotid bruit, no JVD, supple, symmetrical, trachea midline and thyroid not enlarged, symmetric, no tenderness/mass/nodules Back: symmetric, no curvature. ROM normal. No CVA tenderness. Resp: clear to auscultation bilaterally Chest wall: no tenderness Cardio: regular rate and rhythm, S1, S2 normal, no murmur, click, rub or gallop GI: soft, non-tender; bowel sounds normal; no masses,  no organomegaly Extremities: extremities normal, atraumatic, no cyanosis or edema Pulses: 2+ and symmetric Skin: Skin color, texture, turgor normal. No rashes or lesions Neurologic: Grossly normal  Lab Results:  Recent Labs  10/25/13 0538 10/26/13 0514  WBC 10.4 16.2*  HGB 9.7* 9.5*  HCT 26.4* 26.0*  PLT 256 245   BMET  Recent Labs  10/25/13 0538 10/26/13 0514  NA 140 140  K 3.8 4.2  CL 104 103  CO2 24 25  GLUCOSE 76 75  BUN 7 6  CREATININE 0.75 0.76  CALCIUM 8.4 8.8     Studies/Results: Dg Shoulder Right  10/24/2013   CLINICAL DATA:  Right shoulder pain with no known injury  EXAM: RIGHT SHOULDER - 2+ VIEW  COMPARISON:  09/1912  FINDINGS: AP and axillary Y-views show no fracture or dislocation. Diffuse osteosclerosis, mild, is likely related to sickle cell disease.  IMPRESSION: No acute findings. Diffuse osteosclerosis possibly related to known sickle cell. No specific evidence of avascular necrosis, but if this is a concern, inch center MRI of the shoulder.   Electronically Signed   By: Esperanza Heiraymond  Rubner M.D.   On: 10/24/2013 19:26    Medications: I have reviewed the patient's current medications.  Assessment/Plan: A 38 year old gentleman with known history of sickle cell disease admitted with sickle cell painful crisis.  #1 sickle cell painful crisis: Patient doing much better on current regimen. We will keep him on this regimen without change.  #2 right shoulder pain: Again most likely secondary to sickle cell disease continue pain management as simple exercise  #3 sickle cell anemia: H&H seems stable at this point. No significant change from yesterday.  #4 leukocytosis: White count isworsened today. Probably secondary to VOC. Continue to monitor.  #5 tobacco abuse: Patient has been counseled. Nicotine patch offered.  LOS: 2 days   Jolane Bankhead,[name] 10/26/2013, 5:51 PM

## 2013-10-27 LAB — COMPREHENSIVE METABOLIC PANEL
ALK PHOS: 81 U/L (ref 39–117)
ALT: 11 U/L (ref 0–53)
AST: 23 U/L (ref 0–37)
Albumin: 3.8 g/dL (ref 3.5–5.2)
Anion gap: 8 (ref 5–15)
BUN: 5 mg/dL — ABNORMAL LOW (ref 6–23)
CO2: 30 meq/L (ref 19–32)
Calcium: 9.1 mg/dL (ref 8.4–10.5)
Chloride: 103 mEq/L (ref 96–112)
Creatinine, Ser: 0.79 mg/dL (ref 0.50–1.35)
GFR calc non Af Amer: >90 mL/min (ref >90)
Glucose, Bld: 86 mg/dL (ref 70–99)
POTASSIUM: 3.7 meq/L (ref 3.7–5.3)
SODIUM: 141 meq/L (ref 137–147)
Total Bilirubin: 2.8 mg/dL — ABNORMAL HIGH (ref 0.3–1.2)
Total Protein: 7.3 g/dL (ref 6.0–8.3)

## 2013-10-27 LAB — CBC WITH DIFFERENTIAL/PLATELET
Basophils Absolute: 0.1 10*3/uL (ref 0.0–0.1)
Basophils Relative: 0 % (ref 0–1)
Eosinophils Absolute: 0.7 10*3/uL (ref 0.0–0.7)
Eosinophils Relative: 5 % (ref 0–5)
HCT: 24.3 % — ABNORMAL LOW (ref 39.0–52.0)
Hemoglobin: 9 g/dL — ABNORMAL LOW (ref 13.0–17.0)
LYMPHS ABS: 2.8 10*3/uL (ref 0.7–4.0)
Lymphocytes Relative: 21 % (ref 12–46)
MCH: 33.5 pg (ref 26.0–34.0)
MCHC: 37 g/dL — ABNORMAL HIGH (ref 30.0–36.0)
MCV: 90.3 fL (ref 78.0–100.0)
Monocytes Absolute: 1.3 10*3/uL — ABNORMAL HIGH (ref 0.1–1.0)
Monocytes Relative: 9 % (ref 3–12)
NEUTROS ABS: 8.9 10*3/uL — AB (ref 1.7–7.7)
NEUTROS PCT: 65 % (ref 43–77)
PLATELETS: 254 10*3/uL (ref 150–400)
RBC: 2.69 MIL/uL — AB (ref 4.22–5.81)
RDW: 15.1 % (ref 11.5–15.5)
WBC: 13.8 10*3/uL — AB (ref 4.0–10.5)

## 2013-10-27 NOTE — Discharge Summary (Signed)
Physician Discharge Summary  Patient ID: Malik PortHoward E [name] MRN: [id number] DOB/AGE: Apr 10, 1976 38 y.o.  Admit date: 10/24/2013 Discharge date: 10/27/2013  Admission Diagnoses:  Discharge Diagnoses:  Principal Problem:   Sickle cell anemia with crisis Active Problems:   Tobacco abuse   Anemia   Leukocytosis   Sickle cell disease, type [place]   Sickle cell crisis   Discharged Condition: good  Hospital Course: Patient was admitted with sickle cell crisis involving his right shoulder and lower back. Pain was 10 out of 10 on admission with no relief at home. He was started on IV Dilaudid later transitioned to Dilaudid PCA pump. Also given some Toradol IV hydration and supportive care. Patient responded to treatment very well. At the time of discharge pain has got 4/10. He feels comfortable and ready to go home. Denied any fever or chills at this point denied any nausea vomiting or diarrhea.  Consults: None  Significant Diagnostic Studies: labs: CBC and CMP were followed. His hemoglobin stayed at his baseline  Treatments: IV hydration and analgesia: Dilaudid  Discharge Exam: Blood pressure 150/82, pulse 80, temperature 98.1 F (36.7 C), temperature source Oral, resp. rate 16, height 6\' 2"  (1.88 m), weight 76.5 kg (168 lb 10.4 oz), SpO2 97.00%. General appearance: alert, cooperative and no distress Neck: no adenopathy, no carotid bruit, no JVD, supple, symmetrical, trachea midline and thyroid not enlarged, symmetric, no tenderness/mass/nodules Back: symmetric, no curvature. ROM normal. No CVA tenderness. Resp: clear to auscultation bilaterally Chest wall: no tenderness Cardio: regular rate and rhythm, S1, S2 normal, no murmur, click, rub or gallop GI: soft, non-tender; bowel sounds normal; no masses,  no organomegaly Extremities: extremities normal, atraumatic, no cyanosis or edema Pulses: 2+ and symmetric Skin: Skin color, texture, turgor normal. No rashes or lesions Neurologic: Grossly  normal  Disposition: 01-Home or Self Care     Medication List         folic acid 1 MG tablet  Commonly known as:  FOLVITE  Take 1 mg by mouth daily.     morphine 60 MG 12 hr tablet  Commonly known as:  MS CONTIN  Take 1 tablet (60 mg total) by mouth 2 (two) times daily.     morphine 30 MG tablet  Commonly known as:  MSIR  Take 1 tablet (30 mg total) by mouth every 4 (four) hours as needed for pain. Breakthrough pain     multivitamin with minerals Tabs tablet  Take 1 tablet by mouth daily.     promethazine 25 MG tablet  Commonly known as:  PHENERGAN  Take 25 mg by mouth every 6 (six) hours as needed for nausea.         SignedLonia Blood: GARBA,[name] 10/27/2013, 2:44 PM

## 2013-10-27 NOTE — Progress Notes (Signed)
Pt discharged home, verbalized understanding d/c instructions and follow-up appts. Pt denies any complaints or concerns at the time of discharge. Pt VS stable at the time of discharge.

## 2014-01-25 ENCOUNTER — Emergency Department (HOSPITAL_COMMUNITY): Payer: Medicare Other

## 2014-01-25 ENCOUNTER — Inpatient Hospital Stay (HOSPITAL_COMMUNITY)
Admission: EM | Admit: 2014-01-25 | Discharge: 2014-01-27 | DRG: 812 | Disposition: A | Discharge status: Home / Self Care | Payer: Medicare Other | Attending: Internal Medicine | Admitting: Internal Medicine

## 2014-01-25 ENCOUNTER — Encounter (HOSPITAL_COMMUNITY): Payer: Self-pay | Admitting: Emergency Medicine

## 2014-01-25 DIAGNOSIS — D5700 Hb-SS disease with crisis, unspecified: Secondary | ICD-10-CM

## 2014-01-25 DIAGNOSIS — D72829 Elevated white blood cell count, unspecified: Secondary | ICD-10-CM | POA: Diagnosis present

## 2014-01-25 DIAGNOSIS — D57219 Sickle-cell/Hb-C disease with crisis, unspecified: Principal | ICD-10-CM

## 2014-01-25 DIAGNOSIS — E860 Dehydration: Secondary | ICD-10-CM | POA: Diagnosis present

## 2014-01-25 DIAGNOSIS — E871 Hypo-osmolality and hyponatremia: Secondary | ICD-10-CM | POA: Diagnosis present

## 2014-01-25 DIAGNOSIS — D5720 Sickle-cell/Hb-C disease without crisis: Secondary | ICD-10-CM | POA: Diagnosis present

## 2014-01-25 LAB — CBC WITH DIFFERENTIAL/PLATELET
BASOS ABS: 0.1 10*3/uL (ref 0.0–0.1)
Basophils Relative: 1 % (ref 0–1)
Eosinophils Absolute: 0.7 10*3/uL (ref 0.0–0.7)
Eosinophils Relative: 6 % — ABNORMAL HIGH (ref 0–5)
HCT: 33.2 % — ABNORMAL LOW (ref 39.0–52.0)
HEMOGLOBIN: 12.1 g/dL — AB (ref 13.0–17.0)
LYMPHS PCT: 27 % (ref 12–46)
Lymphs Abs: 3 10*3/uL (ref 0.7–4.0)
MCH: 32.5 pg (ref 26.0–34.0)
MCHC: 36.4 g/dL — AB (ref 30.0–36.0)
MCV: 89.2 fL (ref 78.0–100.0)
MONO ABS: 0.9 10*3/uL (ref 0.1–1.0)
MONOS PCT: 8 % (ref 3–12)
NEUTROS PCT: 58 % (ref 43–77)
Neutro Abs: 6.6 10*3/uL (ref 1.7–7.7)
Platelets: 321 10*3/uL (ref 150–400)
RBC: 3.72 MIL/uL — ABNORMAL LOW (ref 4.22–5.81)
RDW: 14.6 % (ref 11.5–15.5)
WBC: 11.3 10*3/uL — ABNORMAL HIGH (ref 4.0–10.5)

## 2014-01-25 LAB — COMPREHENSIVE METABOLIC PANEL
ALT: 12 U/L (ref 0–53)
AST: 22 U/L (ref 0–37)
Albumin: 4.7 g/dL (ref 3.5–5.2)
Alkaline Phosphatase: 85 U/L (ref 39–117)
Anion gap: 13 (ref 5–15)
BILIRUBIN TOTAL: 1.9 mg/dL — AB (ref 0.3–1.2)
BUN: 13 mg/dL (ref 6–23)
CHLORIDE: 99 meq/L (ref 96–112)
CO2: 24 meq/L (ref 19–32)
Calcium: 9.8 mg/dL (ref 8.4–10.5)
Creatinine, Ser: 0.75 mg/dL (ref 0.50–1.35)
GFR calc Af Amer: >90 mL/min (ref >90)
Glucose, Bld: 105 mg/dL — ABNORMAL HIGH (ref 70–99)
POTASSIUM: 4.3 meq/L (ref 3.7–5.3)
Sodium: 136 mEq/L — ABNORMAL LOW (ref 137–147)
Total Protein: 9 g/dL — ABNORMAL HIGH (ref 6.0–8.3)

## 2014-01-25 LAB — RETICULOCYTES
RBC.: 3.72 MIL/uL — ABNORMAL LOW (ref 4.22–5.81)
Retic Count, Absolute: 81.8 10*3/uL (ref 19.0–186.0)
Retic Ct Pct: 2.2 % (ref 0.4–3.1)

## 2014-01-25 MED ORDER — PROMETHAZINE HCL 25 MG/ML IJ SOLN
25.0000 mg | Freq: Once | INTRAMUSCULAR | Status: AC
  Administered 2014-01-25: 25 mg via INTRAVENOUS
  Filled 2014-01-25: qty 1

## 2014-01-25 MED ORDER — SODIUM CHLORIDE 0.9 % IV BOLUS (SEPSIS)
1000.0000 mL | Freq: Once | INTRAVENOUS | Status: AC
  Administered 2014-01-25: 1000 mL via INTRAVENOUS

## 2014-01-25 MED ORDER — ONDANSETRON HCL 4 MG/2ML IJ SOLN
4.0000 mg | Freq: Four times a day (QID) | INTRAMUSCULAR | Status: DC | PRN
Start: 2014-01-25 — End: 2014-01-27
  Administered 2014-01-26 – 2014-01-27 (×2): 4 mg via INTRAVENOUS
  Filled 2014-01-25 (×2): qty 2

## 2014-01-25 MED ORDER — DIPHENHYDRAMINE HCL 12.5 MG/5ML PO ELIX: 12.5000 mg | ORAL_SOLUTION | Freq: Four times a day (QID) | ORAL | Status: DC | PRN

## 2014-01-25 MED ORDER — FOLIC ACID 1 MG PO TABS
1.0000 mg | ORAL_TABLET | Freq: Every day | ORAL | Status: DC
  Administered 2014-01-25 – 2014-01-27 (×3): 1 mg via ORAL
  Filled 2014-01-25 (×3): qty 1

## 2014-01-25 MED ORDER — HYDROMORPHONE HCL 2 MG/ML IJ SOLN
2.0000 mg | Freq: Once | INTRAMUSCULAR | Status: AC
  Administered 2014-01-25: 2 mg via INTRAVENOUS
  Filled 2014-01-25: qty 1

## 2014-01-25 MED ORDER — KETOROLAC TROMETHAMINE 30 MG/ML IJ SOLN
30.0000 mg | Freq: Four times a day (QID) | INTRAMUSCULAR | Status: DC
  Administered 2014-01-26 – 2014-01-27 (×7): 30 mg via INTRAVENOUS
  Filled 2014-01-25 (×11): qty 1

## 2014-01-25 MED ORDER — SODIUM CHLORIDE 0.9 % IV SOLN
25.0000 mg | Freq: Once | INTRAVENOUS | Status: DC
  Filled 2014-01-25: qty 0.5

## 2014-01-25 MED ORDER — DIPHENHYDRAMINE HCL 50 MG/ML IJ SOLN: 12.5000 mg | Freq: Four times a day (QID) | INTRAMUSCULAR | Status: DC | PRN

## 2014-01-25 MED ORDER — SODIUM CHLORIDE 0.9 % IJ SOLN: 9.0000 mL | INTRAMUSCULAR | Status: DC | PRN

## 2014-01-25 MED ORDER — NALOXONE HCL 0.4 MG/ML IJ SOLN
0.4000 mg | INTRAMUSCULAR | Status: DC | PRN
Start: 2014-01-25 — End: 2014-01-27

## 2014-01-25 MED ORDER — HYDROMORPHONE HCL 1 MG/ML IJ SOLN
1.0000 mg | Freq: Once | INTRAMUSCULAR | Status: DC
Start: 2014-01-25 — End: 2014-01-25

## 2014-01-25 MED ORDER — KETOROLAC TROMETHAMINE 30 MG/ML IJ SOLN
30.0000 mg | Freq: Once | INTRAMUSCULAR | Status: AC
  Administered 2014-01-25: 30 mg via INTRAVENOUS
  Filled 2014-01-25: qty 1

## 2014-01-25 MED ORDER — HEPARIN SODIUM (PORCINE) 5000 UNIT/ML IJ SOLN
5000.0000 [IU] | Freq: Three times a day (TID) | INTRAMUSCULAR | Status: DC
  Administered 2014-01-25 – 2014-01-26 (×3): 5000 [IU] via SUBCUTANEOUS
  Filled 2014-01-25 (×8): qty 1

## 2014-01-25 MED ORDER — MORPHINE SULFATE ER 30 MG PO TBCR
60.0000 mg | EXTENDED_RELEASE_TABLET | Freq: Two times a day (BID) | ORAL | Status: DC
  Administered 2014-01-25 – 2014-01-27 (×4): 60 mg via ORAL
  Filled 2014-01-25: qty 4
  Filled 2014-01-25 (×3): qty 2

## 2014-01-25 MED ORDER — HYDROMORPHONE 0.3 MG/ML IV SOLN
INTRAVENOUS | Status: DC
  Administered 2014-01-26: 6.63 mg via INTRAVENOUS
  Administered 2014-01-26: 1.6 mg via INTRAVENOUS
  Administered 2014-01-26: 02:00:00 via INTRAVENOUS
  Administered 2014-01-26: 7.56 mg via INTRAVENOUS
  Administered 2014-01-26: 1.59 mg via INTRAVENOUS
  Administered 2014-01-26: 12:00:00 via INTRAVENOUS
  Administered 2014-01-26: 2 mg via INTRAVENOUS
  Administered 2014-01-26 (×2): via INTRAVENOUS
  Administered 2014-01-26: 4.35 mg via INTRAVENOUS
  Administered 2014-01-27: 5.75 mg via INTRAVENOUS
  Administered 2014-01-27: 3.65 mg via INTRAVENOUS
  Administered 2014-01-27 (×2): via INTRAVENOUS
  Administered 2014-01-27: 6.73 mg via INTRAVENOUS
  Administered 2014-01-27: 4.79 mg via INTRAVENOUS
  Filled 2014-01-25 (×6): qty 25

## 2014-01-25 MED ORDER — PROMETHAZINE HCL 25 MG PO TABS
25.0000 mg | ORAL_TABLET | Freq: Four times a day (QID) | ORAL | Status: DC | PRN
  Administered 2014-01-26: 25 mg via ORAL
  Filled 2014-01-25: qty 1

## 2014-01-25 MED ORDER — HYDROMORPHONE 0.3 MG/ML IV SOLN: INTRAVENOUS | Status: DC

## 2014-01-25 MED ORDER — HYDROMORPHONE 0.3 MG/ML IV SOLN
INTRAVENOUS | Status: DC
  Administered 2014-01-25: 23:00:00 via INTRAVENOUS
  Administered 2014-01-25: 1.5 mg via INTRAVENOUS
  Filled 2014-01-25: qty 25

## 2014-01-25 NOTE — ED Notes (Signed)
Called to give report. Nurse busy, will call back in 10 minutes.

## 2014-01-25 NOTE — ED Provider Notes (Signed)
CSN: [id number]     Arrival date & time 01/25/14  1543 History   First MD Initiated Contact with Patient 01/25/14 1711     Chief Complaint  Patient presents with  . Sickle Cell Pain Crisis  . Back Pain     (Consider location/radiation/quality/duration/timing/severity/associated sxs/prior Treatment) HPI  PMH: sickle cell crisis, anemia, acute chest syndrome  Patient reports sickle cell crisis with pain on the right and left sided rib pain starting 3 days ago. He says pain is stabbing and rates pain 9/10. This is his normal sickle cell pain that he has had in the past. He has taken 90 mg of MS contin for past few days at home with no relief. He reports having to be admitted for his pain in the past. He denies chest pain, shortness of breath, fever, or cough.   Past Medical History  Diagnosis Date  . Sickle cell crisis   . Blood transfusion   . Avascular necrosis of hip     bilateral  . Infection of bone, shoulder region     left shoulder  . Pneumonia   . Avascular necrosis of hip, left 08/27/2011   Past Surgical History  Procedure Laterality Date  . Orif right hip fracture  1995  . Joint replacement  2006    right total hip arthroplasty   History reviewed. No pertinent family history. History  Substance Use Topics  . Smoking status: Former Smoker -- 0.50 packs/day    Types: Cigarettes    Quit date: 01/27/2012  . Smokeless tobacco: Never Used  . Alcohol Use: No    Review of Systems   Allergies  Review of patient's allergies indicates no known allergies.  Home Medications   Prior to Admission medications   Medication Sig Start Date End Date Taking? Authorizing Provider  folic acid (FOLVITE) 1 MG tablet Take 1 mg by mouth daily.   Yes Historical Provider, MD  morphine (MS CONTIN) 60 MG 12 hr tablet Take 1 tablet (60 mg total) by mouth 2 (two) times daily. 11/20/11  Yes Grayce Sessions, NP  morphine (MSIR) 30 MG tablet Take 1 tablet (30 mg total) by mouth every 4  (four) hours as needed for pain. Breakthrough pain 11/20/11  Yes Grayce Sessions, NP  promethazine (PHENERGAN) 25 MG tablet Take 25 mg by mouth every 6 (six) hours as needed for nausea.   Yes Historical Provider, MD   BP 105/64  Pulse 78  Temp(Src) 98.3 F (36.8 C) (Oral)  Resp 17  SpO2 99% Physical Exam  Nursing note and vitals reviewed. Constitutional: He appears well-developed and well-nourished. No distress.  Pt appears uncomfortable  HENT:  Head: Normocephalic and atraumatic.  Eyes: Pupils are equal, round, and reactive to light.  Neck: Normal range of motion. Neck supple.  Cardiovascular: Normal rate and regular rhythm.   Pulmonary/Chest: Effort normal. He has no decreased breath sounds. He has no wheezes. He has no rhonchi. He has no rales. He exhibits no tenderness and no retraction.  Abdominal: Soft.  Neurological: He is alert.  Skin: Skin is warm and dry.    ED Course  Procedures (including critical care time) Labs Review Labs Reviewed  CBC WITH DIFFERENTIAL - Abnormal; Notable for the following:    WBC 11.3 (*)    RBC 3.72 (*)    Hemoglobin 12.1 (*)    HCT 33.2 (*)    MCHC 36.4 (*)    Eosinophils Relative 6 (*)    All other components  within normal limits  COMPREHENSIVE METABOLIC PANEL - Abnormal; Notable for the following:    Sodium 136 (*)    Glucose, Bld 105 (*)    Total Protein 9.0 (*)    Total Bilirubin 1.9 (*)    All other components within normal limits  RETICULOCYTES - Abnormal; Notable for the following:    RBC. 3.72 (*)    All other components within normal limits    Imaging Review Dg Chest 2 View  01/25/2014   CLINICAL DATA:  Sickle cell, chest pain, back pain  EXAM: CHEST  2 VIEW  COMPARISON:  10/24/2013  FINDINGS: Chronic interstitial markings. Scarring in the right middle lung and left lower lobe. No pleural effusion or pneumothorax.  The heart is normal in size.  Visualized osseous structures are within normal limits.  IMPRESSION: No  evidence of acute cardiopulmonary disease.   Electronically Signed   By: Charline BillsSriyesh  Krishnan M.D.   On: 01/25/2014 19:21     EKG Interpretation None      MDM   Final diagnoses:  Sickle cell crisis    Medications  diphenhydrAMINE (BENADRYL) 25 mg in sodium chloride 0.9 % 50 mL IVPB (not administered)  sodium chloride 0.9 % bolus 1,000 mL (1,000 mLs Intravenous New Bag/Given 01/25/14 1957)  sodium chloride 0.9 % bolus 1,000 mL (1,000 mLs Intravenous New Bag/Given 01/25/14 1756)  HYDROmorphone (DILAUDID) injection 2 mg (2 mg Intravenous Given 01/25/14 1802)  ketorolac (TORADOL) 30 MG/ML injection 30 mg (30 mg Intravenous Given 01/25/14 1900)  promethazine (PHENERGAN) injection 25 mg (25 mg Intravenous Given 01/25/14 1900)  HYDROmorphone (DILAUDID) injection 2 mg (2 mg Intravenous Given 01/25/14 1939)   Patient has been given 4mg  of Dilaudid and Toradol. He is not frequently here and will require admission for better pain control. His chest xray is reassuring. Hemoglobin is 12.1. Normal LFTs and renal function. Bili is elevated at 1.9.  [facility]patient, [facility], tele,     Dorthula Matasiffany G Calistro Rauf, PA-C 01/25/14 2053

## 2014-01-25 NOTE — ED Notes (Signed)
Pt reports sickle cell back/bilateral flank pain that began 2 days ago. Pain typical for crisis. No dysuria or n/v

## 2014-01-25 NOTE — H&P (Signed)
[facility] History and Physical  LANCELOT ALYEA WUJ:[id number] DOB: 11/14/1975 DOA: 01/25/2014  Referring physician: EDP PCP: Dorrene German, MD   Chief Complaint: Sickle cell pain crisis   HPI: [name] is a 38 y.o. male who presents to the ED with c/o sickle cell pain crisis.  R and left sided rib pain, symptoms onset 3 days ago and are typical of his sickle cell crisis pains.  Taken 90mg  of MS contin at home for past few days without relief.  Has had to be admitted in the past for sickle cell crisis.  Review of Systems: Systems reviewed.  As above, otherwise negative  Past Medical History  Diagnosis Date  . Sickle cell crisis   . Blood transfusion   . Avascular necrosis of hip     bilateral  . Infection of bone, shoulder region     left shoulder  . Pneumonia   . Avascular necrosis of hip, left 08/27/2011   Past Surgical History  Procedure Laterality Date  . Orif right hip fracture  1995  . Joint replacement  2006    right total hip arthroplasty   Social History:  reports that he quit smoking about 1 years ago. His smoking use included Cigarettes. He smoked 0.50 packs per day. He has never used smokeless tobacco. He reports that he does not drink alcohol or use illicit drugs.  No Known Allergies  History reviewed. No pertinent family history.   Prior to Admission medications   Medication Sig Start Date End Date Taking? Authorizing Provider  folic acid (FOLVITE) 1 MG tablet Take 1 mg by mouth daily.   Yes Historical Provider, MD  morphine (MS CONTIN) 60 MG 12 hr tablet Take 1 tablet (60 mg total) by mouth 2 (two) times daily. 11/20/11  Yes Grayce Sessions, NP  morphine (MSIR) 30 MG tablet Take 1 tablet (30 mg total) by mouth every 4 (four) hours as needed for pain. Breakthrough pain 11/20/11  Yes Grayce Sessions, NP  promethazine (PHENERGAN) 25 MG tablet Take 25 mg by mouth every 6 (six) hours as needed for nausea.   Yes Historical Provider, MD   Physical  Exam: Filed Vitals:   01/25/14 1848  BP: 105/64  Pulse: 78  Temp:   Resp: 17    BP 105/64  Pulse 78  Temp(Src) 98.3 F (36.8 C) (Oral)  Resp 17  SpO2 99%  General Appearance:    Alert, oriented, no distress, appears stated age  Head:    Normocephalic, atraumatic  Eyes:    PERRL, EOMI, sclera non-icteric        Nose:   Nares without drainage or epistaxis. Mucosa, turbinates normal  Throat:   Moist mucous membranes. Oropharynx without erythema or exudate.  Neck:   Supple. No carotid bruits.  No thyromegaly.  No lymphadenopathy.   Back:     No CVA tenderness, no spinal tenderness  Lungs:     Clear to auscultation bilaterally, without wheezes, rhonchi or rales  Chest wall:    No tenderness to palpitation  Heart:    Regular rate and rhythm without murmurs, gallops, rubs  Abdomen:     Soft, non-tender, nondistended, normal bowel sounds, no organomegaly  Genitalia:    deferred  Rectal:    deferred  Extremities:   No clubbing, cyanosis or edema.  Pulses:   2+ and symmetric all extremities  Skin:   Skin color, texture, turgor normal, no rashes or lesions  Lymph nodes:   Cervical,  supraclavicular, and axillary nodes normal  Neurologic:   CNII-XII intact. Normal strength, sensation and reflexes      throughout    Labs on Admission:  Basic Metabolic Panel:  Recent Labs Lab 01/25/14 1753  NA 136*  K 4.3  CL 99  CO2 24  GLUCOSE 105*  BUN 13  CREATININE 0.75  CALCIUM 9.8   Liver Function Tests:  Recent Labs Lab 01/25/14 1753  AST 22  ALT 12  ALKPHOS 85  BILITOT 1.9*  PROT 9.0*  ALBUMIN 4.7   No results found for this basename: LIPASE, AMYLASE,  in the last 168 hours No results found for this basename: AMMONIA,  in the last 168 hours CBC:  Recent Labs Lab 01/25/14 1753  WBC 11.3*  NEUTROABS 6.6  HGB 12.1*  HCT 33.2*  MCV 89.2  PLT 321   Cardiac Enzymes: No results found for this basename: CKTOTAL, CKMB, CKMBINDEX, TROPONINI,  in the last 168  hours  BNP (last 3 results) No results found for this basename: PROBNP,  in the last 8760 hours CBG: No results found for this basename: GLUCAP,  in the last 168 hours  Radiological Exams on Admission: Dg Chest 2 View  01/25/2014   CLINICAL DATA:  Sickle cell, chest pain, back pain  EXAM: CHEST  2 VIEW  COMPARISON:  10/24/2013  FINDINGS: Chronic interstitial markings. Scarring in the right middle lung and left lower lobe. No pleural effusion or pneumothorax.  The heart is normal in size.  Visualized osseous structures are within normal limits.  IMPRESSION: No evidence of acute cardiopulmonary disease.   Electronically Signed   By: Charline BillsSriyesh  Krishnan M.D.   On: 01/25/2014 19:21    EKG: Independently reviewed.  Assessment/Plan Active Problems:   Sickle cell pain crisis   Sickle cell disease, type [place]   Sickle cell crisis   1. Sickle cell pain crisis - 1. HGB looks good at 12 2. Dilaudid PCA ordered per his previous dosing from last admit: load 1.5mg , 0.8mg  demand, 10 min lockout, 3.75 mg /hr limit. 3. Scheduled ketorlac 4. Repeat CBC in AM 5. Continue home MS contin, hold MSIR. 6. No evidence of acute chest    Code Status: Full Code  Family Communication: No family in room Disposition Plan: Admit to inpatient   Time spent: 70 min  Desteni Piscopo M. [facility] Pager [phone]  If 7AM-7PM, please contact the day team taking care of the patient [email or web address] Password [email or web address] 01/25/2014, 9:04 PM

## 2014-01-26 LAB — CBC
HEMATOCRIT: 28 % — AB (ref 39.0–52.0)
Hemoglobin: 10.1 g/dL — ABNORMAL LOW (ref 13.0–17.0)
MCH: 32.8 pg (ref 26.0–34.0)
MCHC: 36.1 g/dL — ABNORMAL HIGH (ref 30.0–36.0)
MCV: 90.9 fL (ref 78.0–100.0)
PLATELETS: 254 10*3/uL (ref 150–400)
RBC: 3.08 MIL/uL — AB (ref 4.22–5.81)
RDW: 15 % (ref 11.5–15.5)
WBC: 12.2 10*3/uL — AB (ref 4.0–10.5)

## 2014-01-26 MED ORDER — OXYCODONE HCL 5 MG PO TABS
10.0000 mg | ORAL_TABLET | Freq: Once | ORAL | Status: AC
  Administered 2014-01-26: 10 mg via ORAL
  Filled 2014-01-26: qty 2

## 2014-01-26 NOTE — Progress Notes (Signed)
Subjective: 38 year old gentleman admitted with sickle cell painful crisis. Patient has been on Dilaudid PCA with Toradol. Pain is now at 7/10. Denied any cough or shortness of breath. He has used 22.75 mg of the Dilaudid in the last 24 hours. He has been able to go to the bathroom and back. Also breathing much better. Patient denies any fever or chills. He believes change in the weather was responsible for his crisis. His hemoglobin dropped from 12-10 mg overnight.  Objective: Vital signs in last 24 hours: Temp:  [97.6 F (36.4 C)-98.3 F (36.8 C)] 98.1 F (36.7 C) (10/02 0530) Pulse Rate:  [51-82] 58 (10/02 1048) Resp:  [10-18] 16 (10/02 1048) BP: (105-121)/(56-78) 116/57 mmHg (10/02 1048) SpO2:  [93 %-99 %] 94 % (10/02 1048) Weight:  [79.379 kg (175 lb)] 79.379 kg (175 lb) (10/01 2238) Weight change:     Intake/Output from previous day: 10/01 0701 - 10/02 0700 In: 720 [P.O.:720] Out: -  Intake/Output this shift:    General appearance: alert, cooperative and no distress Eyes: conjunctivae/corneas clear. PERRL, EOM's intact. Fundi benign. Neck: no adenopathy, no carotid bruit, no JVD, supple, symmetrical, trachea midline and thyroid not enlarged, symmetric, no tenderness/mass/nodules Back: symmetric, no curvature. ROM normal. No CVA tenderness. Resp: clear to auscultation bilaterally Chest wall: no tenderness Cardio: regular rate and rhythm, S1, S2 normal, no murmur, click, rub or gallop GI: soft, non-tender; bowel sounds normal; no masses,  no organomegaly Extremities: extremities normal, atraumatic, no cyanosis or edema Pulses: 2+ and symmetric Skin: Skin color, texture, turgor normal. No rashes or lesions Neurologic: Grossly normal  Lab Results:  Recent Labs  01/25/14 1753 01/26/14 0450  WBC 11.3* 12.2*  HGB 12.1* 10.1*  HCT 33.2* 28.0*  PLT 321 254   BMET  Recent Labs  01/25/14 1753  NA 136*  K 4.3  CL 99  CO2 24  GLUCOSE 105*  BUN 13  CREATININE 0.75   CALCIUM 9.8    Studies/Results: Dg Chest 2 View  01/25/2014   CLINICAL DATA:  Sickle cell, chest pain, back pain  EXAM: CHEST  2 VIEW  COMPARISON:  10/24/2013  FINDINGS: Chronic interstitial markings. Scarring in the right middle lung and left lower lobe. No pleural effusion or pneumothorax.  The heart is normal in size.  Visualized osseous structures are within normal limits.  IMPRESSION: No evidence of acute cardiopulmonary disease.   Electronically Signed   By: Charline BillsSriyesh  Krishnan M.D.   On: 01/25/2014 19:21    Medications: I have reviewed the patient's current medications.  Assessment/Plan: A 38 year old gentleman admitted with sickle cell painful crisis.  #1 sickle cell painful crisis: Patient is doing much better on current regimen. We will continue his Dilaudid PCA high dose as well as Toradol and IV hydration. Also continue his oral medication.  #2 sickle cell anemia: Patient's hemoglobin has dropped but again he's range. Most likely this is secondary to hydration. We'll continue to monitor.  #3 leukocytosis: We'll keep an eye on monitor. Most likely secondary to vaso-occlusive crisis.  #4 hyponatremia: Most likely secondary to dehydration. Continue gentle hydration.  LOS: 1 day   Leahna Hewson,[name] 01/26/2014, 10:59 AM

## 2014-01-27 LAB — COMPREHENSIVE METABOLIC PANEL
ALBUMIN: 3.9 g/dL (ref 3.5–5.2)
ALT: 9 U/L (ref 0–53)
ANION GAP: 11 (ref 5–15)
AST: 22 U/L (ref 0–37)
Alkaline Phosphatase: 73 U/L (ref 39–117)
BILIRUBIN TOTAL: 2.3 mg/dL — AB (ref 0.3–1.2)
BUN: 12 mg/dL (ref 6–23)
CO2: 28 mEq/L (ref 19–32)
CREATININE: 0.77 mg/dL (ref 0.50–1.35)
Calcium: 8.9 mg/dL (ref 8.4–10.5)
Chloride: 104 mEq/L (ref 96–112)
GFR calc Af Amer: >90 mL/min (ref >90)
Glucose, Bld: 73 mg/dL (ref 70–99)
Potassium: 4 mEq/L (ref 3.7–5.3)
Sodium: 143 mEq/L (ref 137–147)
Total Protein: 7.3 g/dL (ref 6.0–8.3)

## 2014-01-27 LAB — CBC WITH DIFFERENTIAL/PLATELET
BASOS PCT: 0 % (ref 0–1)
Basophils Absolute: 0.1 10*3/uL (ref 0.0–0.1)
Eosinophils Absolute: 0.7 10*3/uL (ref 0.0–0.7)
Eosinophils Relative: 6 % — ABNORMAL HIGH (ref 0–5)
HEMATOCRIT: 25.8 % — AB (ref 39.0–52.0)
Hemoglobin: 9.5 g/dL — ABNORMAL LOW (ref 13.0–17.0)
Lymphocytes Relative: 26 % (ref 12–46)
Lymphs Abs: 3.2 10*3/uL (ref 0.7–4.0)
MCH: 32.6 pg (ref 26.0–34.0)
MCHC: 36.8 g/dL — AB (ref 30.0–36.0)
MCV: 88.7 fL (ref 78.0–100.0)
MONO ABS: 1 10*3/uL (ref 0.1–1.0)
MONOS PCT: 8 % (ref 3–12)
NEUTROS ABS: 7.2 10*3/uL (ref 1.7–7.7)
Neutrophils Relative %: 59 % (ref 43–77)
Platelets: 251 10*3/uL (ref 150–400)
RBC: 2.91 MIL/uL — AB (ref 4.22–5.81)
RDW: 15.1 % (ref 11.5–15.5)
WBC: 12.2 10*3/uL — ABNORMAL HIGH (ref 4.0–10.5)

## 2014-01-27 NOTE — Discharge Summary (Signed)
Physician Discharge Summary  Patient ID: [name] MRN: [id number] DOB/AGE: [birth year] 38 y.o.  Admit date: 01/25/2014 Discharge date: 01/27/2014  Admission Diagnoses:  Discharge Diagnoses:  Active Problems:   Sickle cell pain crisis   Sickle cell disease, type [place]   Sickle cell crisis   Discharged Condition: good  Hospital Course: Patient admitted with sickle cell painful crisis. He was started on Dilaudid PCA high dose with Toradol and IV hydration. His oral medications were also continued.Marland Kitchen. He responded to treatment promptly. Was able to move around. His pain level dropped from 10-4 today. He feels much better and was to go home. No fever no cough. Patient is to follow up with his primary care physician within the next week or 2. Also instructed him to take it easy and not do too much in the next 24 to 48 hours. He can resume his activities next week.  Consults: None  Significant Diagnostic Studies: labs: CBC status CMP is well followed closely. Hemoglobin has dropped but most likely secondary to dilution.  Treatments: IV hydration and analgesia: Dilaudid  Discharge Exam: Blood pressure 111/70, pulse 68, temperature 98.1 F (36.7 C), temperature source Oral, resp. rate 12, height 6\' 2"  (1.88 m), weight 79.379 kg (175 lb), SpO2 95.00%. General appearance: alert, cooperative and no distress Eyes: conjunctivae/corneas clear. PERRL, EOM's intact. Fundi benign. Neck: no adenopathy, no carotid bruit, no JVD, supple, symmetrical, trachea midline and thyroid not enlarged, symmetric, no tenderness/mass/nodules Back: symmetric, no curvature. ROM normal. No CVA tenderness. Resp: clear to auscultation bilaterally Chest wall: no tenderness Cardio: regular rate and rhythm, S1, S2 normal, no murmur, click, rub or gallop GI: soft, non-tender; bowel sounds normal; no masses,  no organomegaly Extremities: extremities normal, atraumatic, no cyanosis or edema Pulses: 2+ and symmetric Skin:  Skin color, texture, turgor normal. No rashes or lesions Neurologic: Grossly normal  Disposition: 01-Home or Self Care     Medication List         folic acid 1 MG tablet  Commonly known as:  FOLVITE  Take 1 mg by mouth daily.     morphine 60 MG 12 hr tablet  Commonly known as:  MS CONTIN  Take 1 tablet (60 mg total) by mouth 2 (two) times daily.     morphine 30 MG tablet  Commonly known as:  MSIR  Take 1 tablet (30 mg total) by mouth every 4 (four) hours as needed for pain. Breakthrough pain     promethazine 25 MG tablet  Commonly known as:  PHENERGAN  Take 25 mg by mouth every 6 (six) hours as needed for nausea.         SignedLonia Blood: GARBA,[name] 01/27/2014, 11:57 AM  Time spent 35 minutes

## 2014-01-27 NOTE — ED Provider Notes (Signed)
Medical screening examination/treatment/procedure(s) were conducted as a shared visit with non-physician practitioner(s) and myself.  I personally evaluated the patient during the encounter.  Pt c/o sickle cell pain crisis, c/o pain all over c/w prior sickle cell pain. Denies fever or chills. No cp or sob. No cough. No nvd. Chest cta. abd soft nt. Ivf, pain rx. Labs.    Suzi RootsKevin E Cheetara Hoge, MD 01/27/14 [phone]

## 2014-01-27 NOTE — Progress Notes (Signed)
Pt left at this time with his significant other at his side. Alert, oriented, and without c/o. Discharge instructions given/explained with pt verbalizing understanding.

## 2014-02-06 ENCOUNTER — Encounter (HOSPITAL_COMMUNITY): Payer: Self-pay | Admitting: Emergency Medicine

## 2014-02-06 ENCOUNTER — Inpatient Hospital Stay (HOSPITAL_COMMUNITY)
Admission: EM | Admit: 2014-02-06 | Discharge: 2014-02-09 | DRG: 812 | Disposition: A | Discharge status: Home / Self Care | Payer: Medicare Other | Attending: Internal Medicine | Admitting: Internal Medicine

## 2014-02-06 ENCOUNTER — Emergency Department (HOSPITAL_COMMUNITY): Payer: Medicare Other

## 2014-02-06 DIAGNOSIS — F17210 Nicotine dependence, cigarettes, uncomplicated: Secondary | ICD-10-CM | POA: Diagnosis present

## 2014-02-06 DIAGNOSIS — Z8701 Personal history of pneumonia (recurrent): Secondary | ICD-10-CM

## 2014-02-06 DIAGNOSIS — Z96641 Presence of right artificial hip joint: Secondary | ICD-10-CM | POA: Diagnosis present

## 2014-02-06 DIAGNOSIS — D72829 Elevated white blood cell count, unspecified: Secondary | ICD-10-CM | POA: Diagnosis present

## 2014-02-06 DIAGNOSIS — D5700 Hb-SS disease with crisis, unspecified: Secondary | ICD-10-CM | POA: Diagnosis present

## 2014-02-06 DIAGNOSIS — R0902 Hypoxemia: Secondary | ICD-10-CM | POA: Diagnosis present

## 2014-02-06 DIAGNOSIS — M879 Osteonecrosis, unspecified: Secondary | ICD-10-CM | POA: Diagnosis present

## 2014-02-06 DIAGNOSIS — Z720 Tobacco use: Secondary | ICD-10-CM | POA: Diagnosis present

## 2014-02-06 LAB — COMPREHENSIVE METABOLIC PANEL
ALT: 10 U/L (ref 0–53)
AST: 16 U/L (ref 0–37)
Albumin: 4 g/dL (ref 3.5–5.2)
Alkaline Phosphatase: 89 U/L (ref 39–117)
Anion gap: 13 (ref 5–15)
BUN: 8 mg/dL (ref 6–23)
CALCIUM: 9.1 mg/dL (ref 8.4–10.5)
CO2: 26 mEq/L (ref 19–32)
CREATININE: 0.76 mg/dL (ref 0.50–1.35)
Chloride: 101 mEq/L (ref 96–112)
GLUCOSE: 102 mg/dL — AB (ref 70–99)
Potassium: 3.9 mEq/L (ref 3.7–5.3)
Sodium: 140 mEq/L (ref 137–147)
Total Bilirubin: 1.7 mg/dL — ABNORMAL HIGH (ref 0.3–1.2)
Total Protein: 7.7 g/dL (ref 6.0–8.3)

## 2014-02-06 LAB — RETICULOCYTES
RBC.: 3.18 MIL/uL — AB (ref 4.22–5.81)
RBC.: 2.83 MIL/uL — ABNORMAL LOW (ref 4.22–5.81)
RETIC CT PCT: 4.4 % — AB (ref 0.4–3.1)
Retic Count, Absolute: 139.9 10*3/uL (ref 19.0–186.0)
Retic Count, Absolute: 135.8 10*3/uL (ref 19.0–186.0)
Retic Ct Pct: 4.8 % — ABNORMAL HIGH (ref 0.4–3.1)

## 2014-02-06 LAB — BLOOD GAS, ARTERIAL
ACID-BASE EXCESS: 1 mmol/L (ref 0.0–2.0)
Bicarbonate: 25.4 mEq/L — ABNORMAL HIGH (ref 20.0–24.0)
Drawn by: 232811
O2 Content: 2 L/min
O2 Saturation: 97.3 %
PCO2 ART: 41.3 mmHg (ref 35.0–45.0)
PO2 ART: 87 mmHg (ref 80.0–100.0)
Patient temperature: 97.7
TCO2: 23.8 mmol/L (ref 0–100)
pH, Arterial: 7.403 (ref 7.350–7.450)

## 2014-02-06 LAB — CBC WITH DIFFERENTIAL/PLATELET
BASOS ABS: 0.1 10*3/uL (ref 0.0–0.1)
Basophils Relative: 1 % (ref 0–1)
EOS PCT: 6 % — AB (ref 0–5)
Eosinophils Absolute: 0.8 10*3/uL — ABNORMAL HIGH (ref 0.0–0.7)
HCT: 29 % — ABNORMAL LOW (ref 39.0–52.0)
Hemoglobin: 10.4 g/dL — ABNORMAL LOW (ref 13.0–17.0)
LYMPHS ABS: 2.3 10*3/uL (ref 0.7–4.0)
LYMPHS PCT: 17 % (ref 12–46)
MCH: 32.7 pg (ref 26.0–34.0)
MCHC: 35.9 g/dL (ref 30.0–36.0)
MCV: 91.2 fL (ref 78.0–100.0)
MONO ABS: 1.5 10*3/uL — AB (ref 0.1–1.0)
Monocytes Relative: 11 % (ref 3–12)
Neutro Abs: 9.2 10*3/uL — ABNORMAL HIGH (ref 1.7–7.7)
Neutrophils Relative %: 65 % (ref 43–77)
Platelets: 339 10*3/uL (ref 150–400)
RBC: 3.18 MIL/uL — ABNORMAL LOW (ref 4.22–5.81)
RDW: 14.5 % (ref 11.5–15.5)
WBC: 13.9 10*3/uL — AB (ref 4.0–10.5)

## 2014-02-06 LAB — I-STAT TROPONIN, ED: Troponin i, poc: 0 ng/mL (ref 0.00–0.08)

## 2014-02-06 LAB — MAGNESIUM: Magnesium: 2.2 mg/dL (ref 1.5–2.5)

## 2014-02-06 LAB — PROTIME-INR
INR: 1.07 (ref 0.00–1.49)
Prothrombin Time: 14 seconds (ref 11.6–15.2)

## 2014-02-06 LAB — APTT: aPTT: 30 seconds (ref 24–37)

## 2014-02-06 MED ORDER — SODIUM CHLORIDE 0.9 % IV SOLN
1000.0000 mL | Freq: Once | INTRAVENOUS | Status: AC
  Administered 2014-02-06: 1000 mL via INTRAVENOUS

## 2014-02-06 MED ORDER — SODIUM CHLORIDE 0.9 % IV SOLN
INTRAVENOUS | Status: DC
  Administered 2014-02-06 – 2014-02-07 (×2): via INTRAVENOUS

## 2014-02-06 MED ORDER — NALOXONE HCL 0.4 MG/ML IJ SOLN: 0.4000 mg | INTRAMUSCULAR | Status: DC | PRN

## 2014-02-06 MED ORDER — ONDANSETRON HCL 4 MG/2ML IJ SOLN
4.0000 mg | INTRAMUSCULAR | Status: DC | PRN
  Administered 2014-02-06: 4 mg via INTRAVENOUS
  Filled 2014-02-06: qty 2

## 2014-02-06 MED ORDER — FOLIC ACID 1 MG PO TABS
1.0000 mg | ORAL_TABLET | Freq: Every day | ORAL | Status: DC
  Administered 2014-02-07 – 2014-02-09 (×3): 1 mg via ORAL
  Filled 2014-02-06 (×3): qty 1

## 2014-02-06 MED ORDER — ONDANSETRON HCL 4 MG/2ML IJ SOLN: 4.0000 mg | INTRAMUSCULAR | Status: DC | PRN

## 2014-02-06 MED ORDER — PROMETHAZINE HCL 25 MG PO TABS: 25.0000 mg | ORAL_TABLET | Freq: Four times a day (QID) | ORAL | Status: DC | PRN

## 2014-02-06 MED ORDER — SODIUM CHLORIDE 0.9 % IV SOLN
1000.0000 mL | INTRAVENOUS | Status: DC
  Administered 2014-02-06: 1000 mL via INTRAVENOUS

## 2014-02-06 MED ORDER — DIPHENHYDRAMINE HCL 50 MG/ML IJ SOLN: 12.5000 mg | Freq: Four times a day (QID) | INTRAMUSCULAR | Status: DC | PRN

## 2014-02-06 MED ORDER — ALPRAZOLAM 0.25 MG PO TABS: 0.2500 mg | ORAL_TABLET | ORAL | Status: DC | PRN

## 2014-02-06 MED ORDER — FOLIC ACID 1 MG PO TABS
1.0000 mg | ORAL_TABLET | Freq: Every day | ORAL | Status: DC
Start: 2014-02-06 — End: 2014-02-06

## 2014-02-06 MED ORDER — HYDROMORPHONE HCL 2 MG/ML IJ SOLN
2.0000 mg | Freq: Once | INTRAMUSCULAR | Status: AC
  Administered 2014-02-06: 2 mg via INTRAVENOUS
  Filled 2014-02-06: qty 1

## 2014-02-06 MED ORDER — IBUPROFEN 800 MG PO TABS
800.0000 mg | ORAL_TABLET | Freq: Three times a day (TID) | ORAL | Status: DC | PRN
  Filled 2014-02-06: qty 1

## 2014-02-06 MED ORDER — DIPHENHYDRAMINE HCL 12.5 MG/5ML PO ELIX: 12.5000 mg | ORAL_SOLUTION | Freq: Four times a day (QID) | ORAL | Status: DC | PRN

## 2014-02-06 MED ORDER — HYDROCODONE-ACETAMINOPHEN 5-325 MG PO TABS
1.0000 | ORAL_TABLET | ORAL | Status: DC | PRN
  Filled 2014-02-06: qty 2

## 2014-02-06 MED ORDER — MORPHINE SULFATE (PF) 1 MG/ML IV SOLN
INTRAVENOUS | Status: DC
  Administered 2014-02-06: 3 mg via INTRAVENOUS
  Administered 2014-02-06: 13.49 mg via INTRAVENOUS
  Administered 2014-02-07: 19.5 mg via INTRAVENOUS
  Administered 2014-02-07: 9 mg via INTRAVENOUS
  Administered 2014-02-07: 04:00:00 via INTRAVENOUS
  Filled 2014-02-06 (×2): qty 25

## 2014-02-06 MED ORDER — ONDANSETRON HCL 4 MG PO TABS
4.0000 mg | ORAL_TABLET | ORAL | Status: DC | PRN
  Administered 2014-02-07: 4 mg via ORAL
  Filled 2014-02-06: qty 1

## 2014-02-06 MED ORDER — SODIUM CHLORIDE 0.9 % IJ SOLN: 9.0000 mL | INTRAMUSCULAR | Status: DC | PRN

## 2014-02-06 MED ORDER — NICOTINE 21 MG/24HR TD PT24
21.0000 mg | MEDICATED_PATCH | Freq: Every day | TRANSDERMAL | Status: DC
  Administered 2014-02-06 – 2014-02-09 (×4): 21 mg via TRANSDERMAL
  Filled 2014-02-06 (×4): qty 1

## 2014-02-06 MED ORDER — HEPARIN SODIUM (PORCINE) 5000 UNIT/ML IJ SOLN
5000.0000 [IU] | Freq: Three times a day (TID) | INTRAMUSCULAR | Status: DC
  Administered 2014-02-07 – 2014-02-08 (×5): 5000 [IU] via SUBCUTANEOUS
  Filled 2014-02-06 (×13): qty 1

## 2014-02-06 MED ORDER — ONDANSETRON HCL 4 MG/2ML IJ SOLN: 4.0000 mg | Freq: Four times a day (QID) | INTRAMUSCULAR | Status: DC | PRN

## 2014-02-06 MED ORDER — HYDROMORPHONE HCL 2 MG/ML IJ SOLN
2.0000 mg | INTRAMUSCULAR | Status: AC | PRN
  Administered 2014-02-06 (×2): 2 mg via INTRAVENOUS
  Filled 2014-02-06 (×2): qty 1

## 2014-02-06 MED ORDER — ZOLPIDEM TARTRATE 5 MG PO TABS: 5.0000 mg | ORAL_TABLET | Freq: Every evening | ORAL | Status: DC | PRN

## 2014-02-06 NOTE — ED Notes (Signed)
Patient transported to X-ray 

## 2014-02-06 NOTE — ED Notes (Signed)
Patient c/o progressively worsening center chest pain that radiates into the mid back CP began 4 days ago, Patient also c/o slight SOB as well. Patient has a history of sickle cell pain.

## 2014-02-06 NOTE — H&P (Signed)
[facility] History and Physical  Malik PortHoward E Cohea ZOX:[id number] DOB: Feb 14, 1976 DOA: 02/06/2014  Referring physician: Arby BarretteMarcy Pfeiffer, MD PCP: Dorrene GermanAVBUERE,EDWIN A, MD   Chief Complaint: Sickle Crisis  HPI: [name] is a 38 y.o. male presents with sickle crisis. Patient states that this is one of his typical sickle crisis. Patient has noted increased SOB has been going on for 4 days. He states there is also some chest pain noted. He states that he has no cough no sputum. He has no hemoptysis. Patient denies having any edema. He states there is some pain in his abdomen also. Patient states he is doing a little better with the pain medications now. He states he still smokes. Patient states on and off less than a pack daily   Review of Systems:  Constitutional:  No weight loss, night sweats, ++Fevers, no chills, ++fatigue.  HEENT:  No headaches, Difficulty swallowing Cardio-vascular:  ++chest pain, no Orthopnea, swelling in lower extremities  GI:  No heartburn, indigestion, ++abdominal pain, no nausea, vomiting  Resp:  ++shortness of breath with exertion at rest. no productive cough. No wheezing.  Skin:  no rash or lesions GU:  no dysuria, change in color of urine. No flank pain.  Musculoskeletal:  No joint pain or swelling. ++back pain.  Psych:  No change in mood or affect.   Past Medical History  Diagnosis Date  . Sickle cell crisis   . Blood transfusion   . Avascular necrosis of hip     bilateral  . Infection of bone, shoulder region     left shoulder  . Pneumonia   . Avascular necrosis of hip, left 08/27/2011   Past Surgical History  Procedure Laterality Date  . Orif right hip fracture  1995  . Joint replacement  2006    right total hip arthroplasty  . Bone graft hip iliac crest     Social History:  reports that he has been smoking Cigarettes.  He has been smoking about 0.50 packs per day. He has never used smokeless tobacco. He reports that he does not drink  alcohol or use illicit drugs.  No Known Allergies  Family History  Problem Relation Age of Onset  . Family history unknown: Yes     Prior to Admission medications   Medication Sig Start Date End Date Taking? Authorizing Provider  folic acid (FOLVITE) 1 MG tablet Take 1 mg by mouth daily.   Yes Historical Provider, MD  morphine (MS CONTIN) 60 MG 12 hr tablet Take 1 tablet (60 mg total) by mouth 2 (two) times daily. 11/20/11  Yes Grayce SessionsMichelle P Edwards, NP  morphine (MSIR) 30 MG tablet Take 30 mg by mouth every 4 (four) hours as needed for severe pain (pain).   Yes Historical Provider, MD  promethazine (PHENERGAN) 25 MG tablet Take 25 mg by mouth every 6 (six) hours as needed for nausea (nausea).    Yes Historical Provider, MD   Physical Exam: Filed Vitals:   02/06/14 1800 02/06/14 1830 02/06/14 1849 02/06/14 1850  BP: 119/69 125/66  125/66  Pulse: 77 73  81  Temp:      TempSrc:      Resp: 16 12    SpO2: 94% 93% 91%     Wt Readings from Last 3 Encounters:  01/25/14 79.379 kg (175 lb)  10/25/13 76.5 kg (168 lb 10.4 oz)  05/25/13 77.111 kg (170 lb)    General:  Appears calm and comfortable Eyes: PERRL, normal lids, irises &  conjunctiva ENT: grossly normal hearing, lips & tongue Neck: no LAD, masses or thyromegaly Cardiovascular: RRR, no m/r/g. No LE edema. Respiratory: CTA bilaterally, no w/r/r. Normal respiratory effort. Abdomen: soft, ntnd Skin: no rash or induration seen on limited exam Musculoskeletal: grossly normal tone BUE/BLE Psychiatric: grossly normal mood and affect, speech fluent and appropriate Neurologic: grossly non-focal.          Labs on Admission:  Basic Metabolic Panel:  Recent Labs Lab 02/06/14 1653  NA 140  K 3.9  CL 101  CO2 26  GLUCOSE 102*  BUN 8  CREATININE 0.76  CALCIUM 9.1   Liver Function Tests:  Recent Labs Lab 02/06/14 1653  AST 16  ALT 10  ALKPHOS 89  BILITOT 1.7*  PROT 7.7  ALBUMIN 4.0   No results found for this  basename: LIPASE, AMYLASE,  in the last 168 hours No results found for this basename: AMMONIA,  in the last 168 hours CBC:  Recent Labs Lab 02/06/14 1653  WBC 13.9*  NEUTROABS 9.2*  HGB 10.4*  HCT 29.0*  MCV 91.2  PLT 339   Cardiac Enzymes: No results found for this basename: CKTOTAL, CKMB, CKMBINDEX, TROPONINI,  in the last 168 hours  BNP (last 3 results) No results found for this basename: PROBNP,  in the last 8760 hours CBG: No results found for this basename: GLUCAP,  in the last 168 hours  Radiological Exams on Admission: Dg Chest 2 View  02/06/2014   CLINICAL DATA:  Worsening chest pain.  EXAM: CHEST  2 VIEW  COMPARISON:  01/25/2014.  04/28/2012.  11/10/2011 .  FINDINGS: Mediastinum and hilar structures are normal. Stable cardiomegaly with normal pulmonary vascularity. Diffuse mild interstitial prominence, no change from multiple prior exams consistent with chronic interstitial disease. No pleural effusion or pneumothorax.  IMPRESSION: 1. No acute cardiopulmonary disease. 2. Chronic interstitial lung disease.   Electronically Signed   By: Maisie Fushomas  Register   On: 02/06/2014 16:49      Assessment/Plan Active Problems:   Sickle cell anemia with crisis   Tobacco abuse   Sickle cell pain crisis   Sickle cell crisis   1. Sickle Cell Crisis -patient will be admitted for hydration -will start on PCA pain control -oxygen therapy -monitor blood counts  2. Chest Syndrome history -pain control -no acute infiltrates noted on the CXR    Code Status: Full Code (must indicate code status--if unknown or must be presumed, indicate so) DVT Prophylaxis:Heparin Family Communication: None (indicate person spoken with, if applicable, with phone number if by telephone) Disposition Plan: Home (indicate anticipated LOS)  Time spent: 60min  [facility] CampusKHAN,Taneka Espiritu A [facility] Pager [phone]

## 2014-02-06 NOTE — ED Notes (Signed)
Pt unable to urinate at this time. Will notify staff when able 

## 2014-02-06 NOTE — ED Provider Notes (Signed)
CSN: [id number]     Arrival date & time 02/06/14  1602 History   First MD Initiated Contact with Patient 02/06/14 1611     Chief Complaint  Patient presents with  . Sickle Cell Pain Crisis  . Chest Pain     (Consider location/radiation/quality/duration/timing/severity/associated sxs/prior Treatment) HPI Patient with left-sided sharp chest pain. Onset 4 days ago. Patient reports it is painful for him to move or take a deep breath. He reports he predominantly feels short of breath because of the painful nature of his chest with movement or deep breath. No known fever. Patient reports minimal coughing in association with this. Patient had been in mid recently for chest pain associated with sickle cell. The patient that point I was on his right lateral chest. The patient reports that pain has resolved. He reports a similar pain in the past has been pneumonia or sickle cell pain. Patient denies any nausea vomiting. He denies any generalized pain in the room made her of his body or extremities. He denies any joint swelling or erythema. He denies any calf pain or swelling.  Past Medical History  Diagnosis Date  . Sickle cell crisis   . Blood transfusion   . Avascular necrosis of hip     bilateral  . Infection of bone, shoulder region     left shoulder  . Pneumonia   . Avascular necrosis of hip, left 08/27/2011   Past Surgical History  Procedure Laterality Date  . Orif right hip fracture  1995  . Joint replacement  2006    right total hip arthroplasty  . Bone graft hip iliac crest     Family History  Problem Relation Age of Onset  . Family history unknown: Yes   History  Substance Use Topics  . Smoking status: Current Some Day Smoker -- 0.50 packs/day    Types: Cigarettes    Last Attempt to Quit: 01/27/2012  . Smokeless tobacco: Never Used  . Alcohol Use: No    Review of Systems  10 Systems reviewed and are negative for acute change except as noted in the HPI.   Allergies   Review of patient's allergies indicates no known allergies.  Home Medications   Prior to Admission medications   Medication Sig Start Date End Date Taking? Authorizing Provider  folic acid (FOLVITE) 1 MG tablet Take 1 mg by mouth daily.   Yes Historical Provider, MD  morphine (MS CONTIN) 60 MG 12 hr tablet Take 1 tablet (60 mg total) by mouth 2 (two) times daily. 11/20/11  Yes Grayce SessionsMichelle P Edwards, NP  morphine (MSIR) 30 MG tablet Take 30 mg by mouth every 4 (four) hours as needed for severe pain (pain).   Yes Historical Provider, MD  promethazine (PHENERGAN) 25 MG tablet Take 25 mg by mouth every 6 (six) hours as needed for nausea (nausea).    Yes Historical Provider, MD   BP 125/66  Pulse 81  Temp(Src) 98.7 F (37.1 C) (Oral)  Resp 12  SpO2 91% Physical Exam  Constitutional: He is oriented to person, place, and time. He appears well-developed and well-nourished.  HENT:  Head: Normocephalic and atraumatic.  Eyes: EOM are normal. Pupils are equal, round, and reactive to light.  Neck: Neck supple.  Cardiovascular: Normal rate, regular rhythm, normal heart sounds and intact distal pulses.   Pulmonary/Chest: Effort normal and breath sounds normal. He exhibits tenderness (Patient has very reproducible chest wall pain to palpation over the left chest from the sternal margin to  about the nipple line. no palpable abnormality to).  Abdominal: Soft. Bowel sounds are normal. He exhibits no distension. There is no tenderness.  Musculoskeletal: Normal range of motion. He exhibits no edema.  Neurological: He is alert and oriented to person, place, and time. He has normal strength. Coordination normal. GCS eye subscore is 4. GCS verbal subscore is 5. GCS motor subscore is 6.  Skin: Skin is warm, dry and intact.  Psychiatric: He has a normal mood and affect.    ED Course  Procedures (including critical care time) Labs Review Labs Reviewed  CBC WITH DIFFERENTIAL - Abnormal; Notable for the  following:    WBC 13.9 (*)    RBC 3.18 (*)    Hemoglobin 10.4 (*)    HCT 29.0 (*)    Neutro Abs 9.2 (*)    Monocytes Absolute 1.5 (*)    Eosinophils Relative 6 (*)    Eosinophils Absolute 0.8 (*)    All other components within normal limits  COMPREHENSIVE METABOLIC PANEL - Abnormal; Notable for the following:    Glucose, Bld 102 (*)    Total Bilirubin 1.7 (*)    All other components within normal limits  RETICULOCYTES - Abnormal; Notable for the following:    Retic Ct Pct 4.4 (*)    RBC. 3.18 (*)    All other components within normal limits  PROTIME-INR  APTT  URINALYSIS, ROUTINE W REFLEX MICROSCOPIC  I-STAT TROPOININ, ED    Imaging Review Dg Chest 2 View  02/06/2014   CLINICAL DATA:  Worsening chest pain.  EXAM: CHEST  2 VIEW  COMPARISON:  01/25/2014.  04/28/2012.  11/10/2011 .  FINDINGS: Mediastinum and hilar structures are normal. Stable cardiomegaly with normal pulmonary vascularity. Diffuse mild interstitial prominence, no change from multiple prior exams consistent with chronic interstitial disease. No pleural effusion or pneumothorax.  IMPRESSION: 1. No acute cardiopulmonary disease. 2. Chronic interstitial lung disease.   Electronically Signed   By: Maisie Fushomas  Register   On: 02/06/2014 16:49     EKG Interpretation   Date/Time:  Tuesday February 06 2014 16:09:04 EDT Ventricular Rate:  97 PR Interval:  133 QRS Duration: 84 QT Interval:  347 QTC Calculation: 441 R Axis:   11 Text Interpretation:  Sinus rhythm Abnormal R-wave progression, early  transition Borderline T abnormalities, inferior leads inferior inversion  old. some lateral T wave flattening . abnormal Confirmed by Donnald GarrePfeiffer, MD,  Lebron ConnersMarcy [phone]([id number]) on 02/06/2014 4:24:40 PM     CRITICAL CARE Performed by: Arby BarrettePfeiffer, Havier Deeb   Total critical care time:30  Critical care time was exclusive of separately billable procedures and treating other patients.  Critical care was necessary to treat or prevent imminent or  life-threatening deterioration.  Critical care was time spent personally by me on the following activities: development of treatment plan with patient and/or surrogate as well as nursing, discussions with consultants, evaluation of patient's response to treatment, examination of patient, obtaining history from patient or surrogate, ordering and performing treatments and interventions, ordering and review of laboratory studies, ordering and review of radiographic studies, pulse oximetry and re-evaluation of patient's condition. MDM   Final diagnoses:  Sickle cell crisis   Patient presents with chest pain with known sickle cell anemia. At this point time his reticulocyte count is elevated. The chest x-ray does not show any focal infiltrate. The patient has been afebrile. He will be admitted for continued management of pain and sickle cell crisis.    Arby BarretteMarcy Kemyah Buser, MD 02/06/14 [phone]

## 2014-02-07 MED ORDER — HYDROMORPHONE 2 MG/ML HIGH CONCENTRATION IV PCA SOLN
INTRAVENOUS | Status: DC
  Administered 2014-02-07: 12:00:00 via INTRAVENOUS
  Administered 2014-02-07: 0.8 mg via INTRAVENOUS
  Administered 2014-02-08: 2.4 mg via INTRAVENOUS
  Administered 2014-02-08: 6.4 mg via INTRAVENOUS
  Administered 2014-02-08: 4.8 mg via INTRAVENOUS
  Administered 2014-02-08 (×2): 3.2 mg via INTRAVENOUS
  Administered 2014-02-08: 1.6 mg via INTRAVENOUS
  Administered 2014-02-09: 1.2 mg via INTRAVENOUS
  Administered 2014-02-09: 5.6 mg via INTRAVENOUS
  Administered 2014-02-09: 05:00:00 via INTRAVENOUS
  Filled 2014-02-07 (×2): qty 25

## 2014-02-07 MED ORDER — DEXTROSE-NACL 5-0.45 % IV SOLN
INTRAVENOUS | Status: DC
  Administered 2014-02-07 – 2014-02-09 (×5): via INTRAVENOUS

## 2014-02-07 MED ORDER — PROCHLORPERAZINE EDISYLATE 5 MG/ML IJ SOLN
10.0000 mg | Freq: Once | INTRAMUSCULAR | Status: AC
  Administered 2014-02-07: 10 mg via INTRAVENOUS
  Filled 2014-02-07: qty 2

## 2014-02-07 MED ORDER — HYDROMORPHONE HCL 2 MG/ML IJ SOLN
2.0000 mg | Freq: Once | INTRAMUSCULAR | Status: AC
  Administered 2014-02-07: 2 mg via INTRAVENOUS

## 2014-02-07 MED ORDER — PROCHLORPERAZINE 25 MG RE SUPP
25.0000 mg | Freq: Two times a day (BID) | RECTAL | Status: DC | PRN
Start: 2014-02-07 — End: 2014-02-07

## 2014-02-07 MED ORDER — SODIUM CHLORIDE 0.9 % IJ SOLN: 9.0000 mL | INTRAMUSCULAR | Status: DC | PRN

## 2014-02-07 MED ORDER — ONDANSETRON HCL 4 MG/2ML IJ SOLN
4.0000 mg | Freq: Four times a day (QID) | INTRAMUSCULAR | Status: DC | PRN
  Administered 2014-02-07 – 2014-02-08 (×4): 4 mg via INTRAVENOUS
  Filled 2014-02-07 (×4): qty 2

## 2014-02-07 MED ORDER — ENSURE COMPLETE PO LIQD: 237.0000 mL | Freq: Two times a day (BID) | ORAL | Status: DC | PRN

## 2014-02-07 MED ORDER — NALOXONE HCL 0.4 MG/ML IJ SOLN: 0.4000 mg | INTRAMUSCULAR | Status: DC | PRN

## 2014-02-07 MED ORDER — CETYLPYRIDINIUM CHLORIDE 0.05 % MT LIQD
7.0000 mL | Freq: Two times a day (BID) | OROMUCOSAL | Status: DC
  Administered 2014-02-07 – 2014-02-09 (×4): 7 mL via OROMUCOSAL

## 2014-02-07 MED ORDER — DIPHENHYDRAMINE HCL 25 MG PO CAPS: 25.0000 mg | ORAL_CAPSULE | Freq: Four times a day (QID) | ORAL | Status: DC | PRN

## 2014-02-07 MED ORDER — KETOROLAC TROMETHAMINE 30 MG/ML IJ SOLN
30.0000 mg | Freq: Four times a day (QID) | INTRAMUSCULAR | Status: DC
  Administered 2014-02-07 – 2014-02-09 (×9): 30 mg via INTRAVENOUS
  Filled 2014-02-07 (×12): qty 1

## 2014-02-07 NOTE — Progress Notes (Signed)
SICKLE CELL SERVICE PROGRESS NOTE  Malik PortHoward E Ferrentino AVW:[id number] DOB: May 30, 1975 DOA: 02/06/2014 PCP: Dorrene GermanAVBUERE,EDWIN A, MD  Assessment/Plan: Active Problems:   Sickle cell anemia with crisis   Tobacco abuse   Sickle cell pain crisis   Sickle cell crisis  1. Hb Lacey with crisis: Pt currently under-dosed and on IV Morphine which usually males his pain worse. He still has pain in the chest wall which he rates as 10/10. I will change to a Dilaudid PCA and add Toradol. Change IVF to D5.45 at 100 ml/hr patient still not drinking well. Will re-evaluate pain in 4-8 hours. 2. Mld Leukocytosis: Likely related to crisis. No signs of infection. Will monitor.  Code Status: Full Code Family Communication: N/A Disposition Plan: Not yet ready for discharge  Jaanvi Fizer A.  Pager [phone]. If 7PM-7AM, please contact night-coverage.  02/07/2014, 12:20 PM  LOS: 1 day   Initial History: [name] is a 38 y.o. male presents with sickle crisis. Patient states that this is one of his typical sickle crisis. Patient has noted increased SOB has been going on for 4 days. He states there is also some chest pain noted. He states that he has no cough no sputum. He has no hemoptysis. Patient denies having any edema. He states there is some pain in his abdomen also. Patient states he is doing a little better with the pain medications now. He states he still smokes. Patient states on and off less than a pack daily   Consultants:  None  Procedures:  None  Antibiotics:  None  HPI/Subjective: Pt reports pain primarily localized to chest wall. He rates pain as 10/10 with no relief from current regimen.   Objective: Filed Vitals:   02/07/14 0353 02/07/14 0427 02/07/14 0607 02/07/14 1151  BP:   120/78   Pulse:   57   Temp:   98 F (36.7 C)   TempSrc:   Oral   Resp: 18 18 17 18   Height:      Weight:      SpO2: 100% 100% 98% 99%   Weight change:   Intake/Output Summary (Last 24 hours) at  02/07/14 1220 Last data filed at 02/07/14 1044  Gross per 24 hour  Intake   2150 ml  Output    900 ml  Net   1250 ml    General: Alert, awake, oriented x3, in mild distress.  HEENT: [place]/AT PEERL, EOMI, anicteric Neck: Trachea midline,  no masses, no thyromegal,y no JVD, no carotid bruit OROPHARYNX:  Moist, No exudate/ erythema/lesions.  Heart: Regular rate and rhythm, without murmurs, rubs, gallops, PMI non-displaced, no heaves or thrills on palpation.  Lungs: Clear to auscultation, no wheezing or rhonchi noted. No increased vocal fremitus resonant to percussion  Abdomen: Soft, nontender, nondistended, positive bowel sounds, no masses no hepatosplenomegaly noted..  Neuro: No focal neurological deficits noted cranial nerves II through XII grossly intact. Strength at baseline in bilateral upper and lower extremities. Musculoskeletal: No warm swelling or erythema around joints, no spinal tenderness noted. Psychiatric: Patient alert and oriented x3, good insight and cognition, good recent to remote recall.   Data Reviewed: Basic Metabolic Panel:  Recent Labs Lab 02/06/14 1653 02/06/14 2130  NA 140  --   K 3.9  --   CL 101  --   CO2 26  --   GLUCOSE 102*  --   BUN 8  --   CREATININE 0.76  --   CALCIUM 9.1  --   MG  --  2.2   Liver Function Tests:  Recent Labs Lab 02/06/14 1653  AST 16  ALT 10  ALKPHOS 89  BILITOT 1.7*  PROT 7.7  ALBUMIN 4.0   No results found for this basename: LIPASE, AMYLASE,  in the last 168 hours No results found for this basename: AMMONIA,  in the last 168 hours CBC:  Recent Labs Lab 02/06/14 1653  WBC 13.9*  NEUTROABS 9.2*  HGB 10.4*  HCT 29.0*  MCV 91.2  PLT 339   Cardiac Enzymes: No results found for this basename: CKTOTAL, CKMB, CKMBINDEX, TROPONINI,  in the last 168 hours BNP (last 3 results) No results found for this basename: PROBNP,  in the last 8760 hours CBG: No results found for this basename: GLUCAP,  in the last 168  hours  No results found for this or any previous visit (from the past 240 hour(s)).   Studies: Dg Chest 2 View  02/06/2014   CLINICAL DATA:  Worsening chest pain.  EXAM: CHEST  2 VIEW  COMPARISON:  01/25/2014.  04/28/2012.  11/10/2011 .  FINDINGS: Mediastinum and hilar structures are normal. Stable cardiomegaly with normal pulmonary vascularity. Diffuse mild interstitial prominence, no change from multiple prior exams consistent with chronic interstitial disease. No pleural effusion or pneumothorax.  IMPRESSION: 1. No acute cardiopulmonary disease. 2. Chronic interstitial lung disease.   Electronically Signed   By: Maisie Fushomas  Register   On: 02/06/2014 16:49   Dg Chest 2 View  01/25/2014   CLINICAL DATA:  Sickle cell, chest pain, back pain  EXAM: CHEST  2 VIEW  COMPARISON:  10/24/2013  FINDINGS: Chronic interstitial markings. Scarring in the right middle lung and left lower lobe. No pleural effusion or pneumothorax.  The heart is normal in size.  Visualized osseous structures are within normal limits.  IMPRESSION: No evidence of acute cardiopulmonary disease.   Electronically Signed   By: Charline BillsSriyesh  Krishnan M.D.   On: 01/25/2014 19:21    Scheduled Meds: . antiseptic oral rinse  7 mL Mouth Rinse BID  . folic acid  1 mg Oral Daily  . heparin  5,000 Units Subcutaneous 3 times per day  . HYDROmorphone PCA 2 mg/mL   Intravenous 6 times per day  . ketorolac  30 mg Intravenous 4 times per day  . nicotine  21 mg Transdermal Daily   Continuous Infusions: . dextrose 5 % and 0.45% NaCl 100 mL/hr at 02/07/14 1148    Total time spent 39 minutes

## 2014-02-07 NOTE — Progress Notes (Signed)
INITIAL NUTRITION ASSESSMENT  DOCUMENTATION CODES Per approved criteria  -Not Applicable   INTERVENTION: -Recommend Ensure Complete po BID PRN, each supplement provides 350 kcal and 13 grams of protein -Encouraged intake of nutrition supplement alternatives and discussed importance of optimal nutrition during illness -RD to continue to monitor; will provide coupons per pt preference   NUTRITION DIAGNOSIS: Inadequate oral intake related to nausea/SOB/pain as evidenced by decreased PO intake.   Goal: Pt to meet >/= 90% of their estimated nutrition needs    Monitor:  Total protein/energy intake, labs, weights, supplement tolerance  Reason for Assessment: Consult to Assess  38 y.o. male  Admitting Dx: <principal problem not specified>  ASSESSMENT: [name] is a 38 y.o. male presents with sickle crisis. Patient states that this is one of his typical sickle crisis. Patient has noted increased SOB has been going on for 4 days. He states there is also some chest pain noted  -Pt reported decreased appetite for past several days pta d/t pain r/t sickle cell crisis- experiencing some nausea and SOB that have inhibited PO intake -Diet recall indicates pt typically consumes one full meal with "junk foods" for snacks in replacement of other meals -Denied any unintentional wt loss. Noted he has always been smaller framed and lean built -Had not yet ordered meal during time of RD assessment; skipped breakfast, and was ordering late lunch during time of RD visit -Friend in room concerned about pt's nutritional status and overall dietary habits. Was in agreement for pt to trial Ensure Complete during admit. Provided pt with sample and will order PRN. Offered coupons per pt preference. Reviewed nutrition supplement alternatives, and encouraged general healthy PO intake during time of illness/recovery -PO intake 100% during previous admit in 10/02; family had been bringing in food from outside  sources per RN documentation  Height: Ht Readings from Last 1 Encounters:  02/06/14 6\' 2"  (1.88 m)    Weight: Wt Readings from Last 1 Encounters:  02/06/14 176 lb 9.6 oz (80.105 kg)    Ideal Body Weight: 190 lbs  % Ideal Body Weight: 93%  Wt Readings from Last 10 Encounters:  02/06/14 176 lb 9.6 oz (80.105 kg)  01/25/14 175 lb (79.379 kg)  10/25/13 168 lb 10.4 oz (76.5 kg)  05/25/13 170 lb (77.111 kg)  10/19/12 174 lb 12.8 oz (79.289 kg)  04/29/12 181 lb 4.8 oz (82.237 kg)  11/20/11 182 lb 1.6 oz (82.6 kg)  08/21/11 180 lb (81.647 kg)  05/19/11 175 lb 1.6 oz (79.425 kg)    Usual Body Weight: 175-180 lb  % Usual Body Weight: 100%  BMI:  Body mass index is 22.66 kg/(m^2).  Estimated Nutritional Needs: Kcal: 2000-2200 Protein: 80-95 gram Fluid: >/= 2000 ml daily  Skin: WDL  Diet Order: General  EDUCATION NEEDS: -Education needs addressed   Intake/Output Summary (Last 24 hours) at 02/07/14 1452 Last data filed at 02/07/14 1354  Gross per 24 hour  Intake   2360 ml  Output   3100 ml  Net   -740 ml    Last BM: 10/13   Labs:   Recent Labs Lab 02/06/14 1653 02/06/14 2130  NA 140  --   K 3.9  --   CL 101  --   CO2 26  --   BUN 8  --   CREATININE 0.76  --   CALCIUM 9.1  --   MG  --  2.2  GLUCOSE 102*  --     CBG (last 3)  No results found for this basename: GLUCAP,  in the last 72 hours  Scheduled Meds: . antiseptic oral rinse  7 mL Mouth Rinse BID  . folic acid  1 mg Oral Daily  . heparin  5,000 Units Subcutaneous 3 times per day  . HYDROmorphone PCA 2 mg/mL   Intravenous 6 times per day  . ketorolac  30 mg Intravenous 4 times per day  . nicotine  21 mg Transdermal Daily    Continuous Infusions: . dextrose 5 % and 0.45% NaCl 100 mL/hr at 02/07/14 1148    Past Medical History  Diagnosis Date  . Sickle cell crisis   . Blood transfusion   . Avascular necrosis of hip     bilateral  . Infection of bone, shoulder region     left  shoulder  . Pneumonia   . Avascular necrosis of hip, left 08/27/2011    Past Surgical History  Procedure Laterality Date  . Orif right hip fracture  1995  . Joint replacement  2006    right total hip arthroplasty  . Bone graft hip iliac crest      Lloyd HugerSarah F Chalon Zobrist MS RD LDN Clinical Dietitian Pager:[phone]

## 2014-02-08 DIAGNOSIS — D72829 Elevated white blood cell count, unspecified: Secondary | ICD-10-CM

## 2014-02-08 LAB — CBC WITH DIFFERENTIAL/PLATELET
Basophils Absolute: 0.1 10*3/uL (ref 0.0–0.1)
Basophils Relative: 1 % (ref 0–1)
EOS PCT: 7 % — AB (ref 0–5)
Eosinophils Absolute: 0.7 10*3/uL (ref 0.0–0.7)
HEMATOCRIT: 25.7 % — AB (ref 39.0–52.0)
Hemoglobin: 9.2 g/dL — ABNORMAL LOW (ref 13.0–17.0)
LYMPHS PCT: 27 % (ref 12–46)
Lymphs Abs: 3 10*3/uL (ref 0.7–4.0)
MCH: 32.6 pg (ref 26.0–34.0)
MCHC: 35.8 g/dL (ref 30.0–36.0)
MCV: 91.1 fL (ref 78.0–100.0)
MONO ABS: 1.2 10*3/uL — AB (ref 0.1–1.0)
Monocytes Relative: 10 % (ref 3–12)
Neutro Abs: 6.3 10*3/uL (ref 1.7–7.7)
Neutrophils Relative %: 55 % (ref 43–77)
Platelets: 290 10*3/uL (ref 150–400)
RBC: 2.82 MIL/uL — AB (ref 4.22–5.81)
RDW: 14.6 % (ref 11.5–15.5)
WBC: 11.3 10*3/uL — AB (ref 4.0–10.5)

## 2014-02-08 LAB — LACTATE DEHYDROGENASE: LDH: 185 U/L (ref 94–250)

## 2014-02-08 MED ORDER — LACTULOSE 10 GM/15ML PO SOLN
20.0000 g | Freq: Once | ORAL | Status: AC
  Administered 2014-02-08: 20 g via ORAL

## 2014-02-08 NOTE — Progress Notes (Signed)
Patients use of High Dose Dilaudid for past 24 hours: 27.6 mg used 32 demands  32 deliveries

## 2014-02-08 NOTE — Progress Notes (Signed)
Subjective: A 38 yo man admitted with sickle cell painful crisis triggered by "flu shot" according to patient. He has been on Dilaudid PCA and has used 27.6 mg with 32 demands and 32 deliveries. He has felt better overnight with pain now at 7/10 involving his rib cage. No fever, no NVD. No Diaphoresis. He has been able to get out of bed without difficulties.  Objective: Vital signs in last 24 hours: Temp:  [97.8 F (36.6 C)-98.1 F (36.7 C)] 98.1 F (36.7 C) (10/15 1037) Pulse Rate:  [54-78] 60 (10/15 1037) Resp:  [10-18] 13 (10/15 1037) BP: (99-125)/(61-82) 123/73 mmHg (10/15 1037) SpO2:  [96 %-100 %] 100 % (10/15 1037) Weight:  [78.926 kg (174 lb)] 78.926 kg (174 lb) (10/15 0548) Weight change: -1.179 kg (-2 lb 9.6 oz) Last BM Date: 02/06/14  Intake/Output from previous day: 10/14 0701 - 10/15 0700 In: 893.3 [I.V.:893.3] Out: 3200 [Urine:3200] Intake/Output this shift: Total I/O In: -  Out: 1000 [Urine:1000]  General appearance: alert, cooperative and no distress Eyes: conjunctivae/corneas clear. PERRL, EOM's intact. Fundi benign. Throat: lips, mucosa, and tongue normal; teeth and gums normal Neck: no adenopathy, no carotid bruit, no JVD, supple, symmetrical, trachea midline and thyroid not enlarged, symmetric, no tenderness/mass/nodules Back: symmetric, no curvature. ROM normal. No CVA tenderness. Resp: clear to auscultation bilaterally Chest wall: no tenderness Cardio: regular rate and rhythm, S1, S2 normal, no murmur, click, rub or gallop GI: soft, non-tender; bowel sounds normal; no masses,  no organomegaly Extremities: extremities normal, atraumatic, no cyanosis or edema Pulses: 2+ and symmetric Skin: Skin color, texture, turgor normal. No rashes or lesions Neurologic: Grossly normal  Lab Results:  Recent Labs  02/06/14 1653 02/08/14 0506  WBC 13.9* 11.3*  HGB 10.4* 9.2*  HCT 29.0* 25.7*  PLT 339 290   BMET  Recent Labs  02/06/14 1653  NA 140  K 3.9   CL 101  CO2 26  GLUCOSE 102*  BUN 8  CREATININE 0.76  CALCIUM 9.1    Studies/Results: Dg Chest 2 View  02/06/2014   CLINICAL DATA:  Worsening chest pain.  EXAM: CHEST  2 VIEW  COMPARISON:  01/25/2014.  04/28/2012.  11/10/2011 .  FINDINGS: Mediastinum and hilar structures are normal. Stable cardiomegaly with normal pulmonary vascularity. Diffuse mild interstitial prominence, no change from multiple prior exams consistent with chronic interstitial disease. No pleural effusion or pneumothorax.  IMPRESSION: 1. No acute cardiopulmonary disease. 2. Chronic interstitial lung disease.   Electronically Signed   By: Maisie Fushomas  Register   On: 02/06/2014 16:49    Medications: I have reviewed the patient's current medications.  Assessment/Plan: A 38 yo man with sickle cell Painful crisis.  #1 Sickle Cell Painful crisis: Patient is doing well on current regimen. He is on both Toradol and Ibuprofen. I will DC Ibuprofen. Also DC IVF and continue oral medications. Mobilize patient today.  #2 Tobacco abuse: counseled.  #3 Leucocytosis: Probably due to VOC. Improving  #4 Sickle Cell anemia: H/H slightly dropped probably due to hemodilution. Continue to monitor.   LOS: 2 days   Javyon Fontan,[name] 02/08/2014, 11:49 AM

## 2014-02-09 DIAGNOSIS — Z720 Tobacco use: Secondary | ICD-10-CM

## 2014-02-09 LAB — COMPREHENSIVE METABOLIC PANEL
ALT: 8 U/L (ref 0–53)
ANION GAP: 13 (ref 5–15)
AST: 19 U/L (ref 0–37)
Albumin: 3.9 g/dL (ref 3.5–5.2)
Alkaline Phosphatase: 80 U/L (ref 39–117)
BILIRUBIN TOTAL: 1.4 mg/dL — AB (ref 0.3–1.2)
BUN: 9 mg/dL (ref 6–23)
CHLORIDE: 98 meq/L (ref 96–112)
CO2: 27 meq/L (ref 19–32)
CREATININE: 0.73 mg/dL (ref 0.50–1.35)
Calcium: 9.2 mg/dL (ref 8.4–10.5)
GLUCOSE: 89 mg/dL (ref 70–99)
Potassium: 3.8 mEq/L (ref 3.7–5.3)
Sodium: 138 mEq/L (ref 137–147)
Total Protein: 7.4 g/dL (ref 6.0–8.3)

## 2014-02-09 LAB — CBC WITH DIFFERENTIAL/PLATELET
BASOS PCT: 1 % (ref 0–1)
Basophils Absolute: 0.1 10*3/uL (ref 0.0–0.1)
Eosinophils Absolute: 0.9 10*3/uL — ABNORMAL HIGH (ref 0.0–0.7)
Eosinophils Relative: 9 % — ABNORMAL HIGH (ref 0–5)
HCT: 25.6 % — ABNORMAL LOW (ref 39.0–52.0)
HEMOGLOBIN: 9.3 g/dL — AB (ref 13.0–17.0)
Lymphocytes Relative: 31 % (ref 12–46)
Lymphs Abs: 3 10*3/uL (ref 0.7–4.0)
MCH: 32.5 pg (ref 26.0–34.0)
MCHC: 36.3 g/dL — AB (ref 30.0–36.0)
MCV: 89.5 fL (ref 78.0–100.0)
MONO ABS: 1 10*3/uL (ref 0.1–1.0)
MONOS PCT: 10 % (ref 3–12)
NEUTROS ABS: 5 10*3/uL (ref 1.7–7.7)
Neutrophils Relative %: 49 % (ref 43–77)
Platelets: 308 10*3/uL (ref 150–400)
RBC: 2.86 MIL/uL — ABNORMAL LOW (ref 4.22–5.81)
RDW: 14.4 % (ref 11.5–15.5)
WBC: 9.9 10*3/uL (ref 4.0–10.5)

## 2014-02-09 MED ORDER — HYDROMORPHONE 2 MG/ML HIGH CONCENTRATION IV PCA SOLN
INTRAVENOUS | Status: DC
  Administered 2014-02-09: 4 mg via INTRAVENOUS

## 2014-02-09 MED ORDER — MORPHINE SULFATE ER 30 MG PO TBCR
60.0000 mg | EXTENDED_RELEASE_TABLET | Freq: Two times a day (BID) | ORAL | Status: DC
  Administered 2014-02-09: 60 mg via ORAL
  Filled 2014-02-09: qty 2

## 2014-02-09 MED ORDER — MORPHINE SULFATE 30 MG PO TABS
30.0000 mg | ORAL_TABLET | ORAL | Status: DC
  Administered 2014-02-09: 30 mg via ORAL
  Filled 2014-02-09: qty 1

## 2014-02-09 NOTE — Care Management Note (Signed)
CARE MANAGEMENT NOTE 02/09/2014  Patient:  [name],[name]   Account Number:  [id number]  Date Initiated:  02/07/2014  Documentation initiated by:  Ezekiel InaMcGIBBONEY,COOKIE  Subjective/Objective Assessment:   pt admitted with sickle cell crisis     Action/Plan:   from home   Anticipated DC Date:  02/10/2014   Anticipated DC Plan:  HOME/SELF CARE         Choice offered to / List presented to:             Status of service:  Completed, signed off Medicare Important Message given?  YES (If response is "NO", the following Medicare IM given date fields will be blank) Date Medicare IM given:  02/09/2014 Medicare IM given by:  Sandford CrazeLEMENTS,Aminta Sakurai Date Additional Medicare IM given:   Additional Medicare IM given by:    Discharge Disposition:    Per UR Regulation:  Reviewed for med. necessity/level of care/duration of stay  If discussed at Long Length of Stay Meetings, dates discussed:    Comments:  02/09/14 Sandford CrazeNora Man Bonneau RN,BSN,NCM CM following.  02/07/14 MMcGibboney, RN, BSN Chart reveiwed.

## 2014-02-09 NOTE — Discharge Summary (Signed)
Malik PortHoward E Lavalley MRN: [id number] DOB/AGE: 12/20/1975 38 y.o.  Admit date: 02/06/2014 Discharge date: 02/09/2014  Primary Care Physician:  Dorrene GermanAVBUERE,EDWIN A, MD   Discharge Diagnoses:   Patient Active Problem List   Diagnosis Date Noted  . Sickle cell crisis 10/24/2013  . Sickle cell disease, type [place] 10/18/2012  . Sickle cell pain crisis 10/13/2012  . History of tobacco abuse 10/13/2012  . Hypokalemia 05/02/2012  . Leukocytosis 05/02/2012  . Anemia 11/08/2011  . Avascular necrosis of hip, left 08/27/2011  . Elevated brain natriuretic peptide (BNP) level 08/25/2011  . Acute chest syndrome(517.3) 05/15/2011  . Tobacco abuse 05/15/2011  . Constipation 05/14/2011  . Sickle cell anemia with crisis 05/13/2011    DISCHARGE MEDICATION:   Medication List         folic acid 1 MG tablet  Commonly known as:  FOLVITE  Take 1 mg by mouth daily.     morphine 30 MG tablet  Commonly known as:  MSIR  Take 30 mg by mouth every 4 (four) hours as needed for severe pain (pain).     morphine 60 MG 12 hr tablet  Commonly known as:  MS CONTIN  Take 1 tablet (60 mg total) by mouth 2 (two) times daily.     promethazine 25 MG tablet  Commonly known as:  PHENERGAN  Take 25 mg by mouth every 6 (six) hours as needed for nausea (nausea).          Consults:     SIGNIFICANT DIAGNOSTIC STUDIES:  Dg Chest 2 View  02/06/2014   CLINICAL DATA:  Worsening chest pain.  EXAM: CHEST  2 VIEW  COMPARISON:  01/25/2014.  04/28/2012.  11/10/2011 .  FINDINGS: Mediastinum and hilar structures are normal. Stable cardiomegaly with normal pulmonary vascularity. Diffuse mild interstitial prominence, no change from multiple prior exams consistent with chronic interstitial disease. No pleural effusion or pneumothorax.  IMPRESSION: 1. No acute cardiopulmonary disease. 2. Chronic interstitial lung disease.   Electronically Signed   By: Maisie Fushomas  Register   On: 02/06/2014 16:49   Dg Chest 2 View  01/25/2014   CLINICAL  DATA:  Sickle cell, chest pain, back pain  EXAM: CHEST  2 VIEW  COMPARISON:  10/24/2013  FINDINGS: Chronic interstitial markings. Scarring in the right middle lung and left lower lobe. No pleural effusion or pneumothorax.  The heart is normal in size.  Visualized osseous structures are within normal limits.  IMPRESSION: No evidence of acute cardiopulmonary disease.   Electronically Signed   By: Charline BillsSriyesh  Krishnan M.D.   On: 01/25/2014 19:21      No results found for this or any previous visit (from the past 240 hour(s)).  BRIEF ADMITTING H & P: HPI: [name] is a 38 y.o. male presents with sickle crisis. Patient states that this is one of his typical sickle crisis. Patient has noted increased SOB has been going on for 4 days. He states there is also some chest pain noted. He states that he has no cough no sputum. He has no hemoptysis. Patient denies having any edema. He states there is some pain in his abdomen also. Patient states he is doing a little better with the pain medications now. He states he still smokes. Patient states on and off less than a pack daily    Hospital Course:  Present on Admission:  . Hb [place] with crisis: Pt was initially treated with Morphine Sulfate at a reduced dose. This of course was ineffective and he was  transitioned to Weight based Dilaudid PCA within 24 hours. Additionally Toradol was added and MS Contin was continued. He had a significant reduction in pain and he was transitioned to oral analgesics. He is discharged home in stable condition. Pt has had several ED visits for pain. He would likely benefit from Hydroxyurea for decreasing risk in SCD.  . Tobacco abuse: Counseled against further tobacco use. Hewas treated with Nicoderm transdermal for prophylaxis against withdrawal.  . Hypoxemia; pt c/o chest wall pain and had a small amount of hypoxemia. CXR showed no acute lung findings. At the time of discharge, he was saurating 100% on RA.  Disposition and  Follow-up:  Pt discharged in good and is to follow up with his PMD Dr. Concepcion ElkAvbuere in 1 week.    DISCHARGE EXAM:  General: Alert, awake, oriented x3, in mild distress.  Vital Signs: BP 130/85, HR4, T97.5 F (36.4 C), temperature source Oral, RR0, height 6\' 2"  (1.88 m), weight 170 lb 1.6 oz (77.157 kg), SpO2 100.00%. HEENT: [place]/AT PEERL, EOMI, anicteric  Neck: Trachea midline, no masses, no thyromegal,y no JVD, no carotid bruit  OROPHARYNX: Moist, No exudate/ erythema/lesions.  Heart: Regular rate and rhythm, without murmurs, rubs, gallops, PMI non-displaced, no heaves or thrills on palpation.  Lungs: Clear to auscultation, no wheezing or rhonchi noted. No increased vocal fremitus resonant to percussion  Abdomen: Soft, nontender, nondistended, positive bowel sounds, no masses no hepatosplenomegaly noted..  Neuro: No focal neurological deficits noted cranial nerves II through XII grossly intact. Strength at baseline in bilateral upper and lower extremities.  Musculoskeletal: No warm swelling or erythema around joints, no spinal tenderness noted.  Psychiatric: Patient alert and oriented x3, good insight and cognition, good recent to remote recall.    Recent Labs  02/06/14 1653 02/06/14 2130 02/09/14 0445  NA 140  --  138  K 3.9  --  3.8  CL 101  --  98  CO2 26  --  27  GLUCOSE 102*  --  89  BUN 8  --  9  CREATININE 0.76  --  0.73  CALCIUM 9.1  --  9.2  MG  --  2.2  --     Recent Labs  02/06/14 1653 02/09/14 0445  AST 16 19  ALT 10 8  ALKPHOS 89 80  BILITOT 1.7* 1.4*  PROT 7.7 7.4  ALBUMIN 4.0 3.9   No results found for this basename: LIPASE, AMYLASE,  in the last 72 hours  Recent Labs  02/08/14 0506 02/09/14 0445  WBC 11.3* 9.9  NEUTROABS 6.3 5.0  HGB 9.2* 9.3*  HCT 25.7* 25.6*  MCV 91.1 89.5  PLT 290 308   Total time spent in decision making and face to face exceeded 30 minutes.  Signed: Quentin Shorey A. 02/09/2014, 1:27 PM

## 2014-02-09 NOTE — Progress Notes (Cosign Needed)
Wasted 2mg  (1ml) of Dilaudid PCA syringe with Nadine A. RN. Nadine to [employer].[name], [name]

## 2014-02-09 NOTE — Progress Notes (Signed)
Wasted 23 ml high dose dilaudid pca

## 2014-05-05 ENCOUNTER — Inpatient Hospital Stay (HOSPITAL_COMMUNITY)
Admission: EM | Admit: 2014-05-05 | Discharge: 2014-05-09 | DRG: 812 | Disposition: A | Discharge status: Home / Self Care | Payer: Medicare Other | Attending: Internal Medicine | Admitting: Internal Medicine

## 2014-05-05 ENCOUNTER — Encounter (HOSPITAL_COMMUNITY): Payer: Self-pay | Admitting: *Deleted

## 2014-05-05 ENCOUNTER — Emergency Department (HOSPITAL_COMMUNITY): Payer: Medicare Other

## 2014-05-05 DIAGNOSIS — D72829 Elevated white blood cell count, unspecified: Secondary | ICD-10-CM | POA: Diagnosis present

## 2014-05-05 DIAGNOSIS — E860 Dehydration: Secondary | ICD-10-CM | POA: Diagnosis present

## 2014-05-05 DIAGNOSIS — D5700 Hb-SS disease with crisis, unspecified: Principal | ICD-10-CM | POA: Diagnosis present

## 2014-05-05 DIAGNOSIS — K5900 Constipation, unspecified: Secondary | ICD-10-CM | POA: Diagnosis present

## 2014-05-05 DIAGNOSIS — Z96641 Presence of right artificial hip joint: Secondary | ICD-10-CM | POA: Diagnosis present

## 2014-05-05 DIAGNOSIS — F17210 Nicotine dependence, cigarettes, uncomplicated: Secondary | ICD-10-CM | POA: Diagnosis present

## 2014-05-05 DIAGNOSIS — E876 Hypokalemia: Secondary | ICD-10-CM | POA: Diagnosis present

## 2014-05-05 DIAGNOSIS — Z87891 Personal history of nicotine dependence: Secondary | ICD-10-CM

## 2014-05-05 DIAGNOSIS — R079 Chest pain, unspecified: Secondary | ICD-10-CM

## 2014-05-05 DIAGNOSIS — R0781 Pleurodynia: Secondary | ICD-10-CM | POA: Diagnosis not present

## 2014-05-05 LAB — CBC WITH DIFFERENTIAL/PLATELET
Basophils Absolute: 0.1 10*3/uL (ref 0.0–0.1)
Basophils Relative: 1 % (ref 0–1)
Eosinophils Absolute: 0.5 10*3/uL (ref 0.0–0.7)
Eosinophils Relative: 4 % (ref 0–5)
HCT: 31.5 % — ABNORMAL LOW (ref 39.0–52.0)
Hemoglobin: 11.5 g/dL — ABNORMAL LOW (ref 13.0–17.0)
Lymphocytes Relative: 24 % (ref 12–46)
Lymphs Abs: 2.8 10*3/uL (ref 0.7–4.0)
MCH: 33 pg (ref 26.0–34.0)
MCHC: 36.5 g/dL — ABNORMAL HIGH (ref 30.0–36.0)
MCV: 90.5 fL (ref 78.0–100.0)
Monocytes Absolute: 1 10*3/uL (ref 0.1–1.0)
Monocytes Relative: 8 % (ref 3–12)
Neutro Abs: 7.7 10*3/uL (ref 1.7–7.7)
Neutrophils Relative %: 63 % (ref 43–77)
Platelets: 368 10*3/uL (ref 150–400)
RBC: 3.48 MIL/uL — ABNORMAL LOW (ref 4.22–5.81)
RDW: 14.6 % (ref 11.5–15.5)
WBC: 12 10*3/uL — ABNORMAL HIGH (ref 4.0–10.5)

## 2014-05-05 LAB — COMPREHENSIVE METABOLIC PANEL
ALT: 12 U/L (ref 0–53)
AST: 22 U/L (ref 0–37)
Albumin: 4.9 g/dL (ref 3.5–5.2)
Alkaline Phosphatase: 82 U/L (ref 39–117)
Anion gap: 8 (ref 5–15)
BUN: 13 mg/dL (ref 6–23)
CO2: 26 mmol/L (ref 19–32)
Calcium: 9.2 mg/dL (ref 8.4–10.5)
Chloride: 106 mEq/L (ref 96–112)
Creatinine, Ser: 0.77 mg/dL (ref 0.50–1.35)
GFR calc Af Amer: >90 mL/min (ref >90)
GFR calc non Af Amer: >90 mL/min (ref >90)
Glucose, Bld: 103 mg/dL — ABNORMAL HIGH (ref 70–99)
Potassium: 4 mmol/L (ref 3.5–5.1)
Sodium: 140 mmol/L (ref 135–145)
Total Bilirubin: 2.4 mg/dL — ABNORMAL HIGH (ref 0.3–1.2)
Total Protein: 8.3 g/dL (ref 6.0–8.3)

## 2014-05-05 LAB — RETICULOCYTES
RBC.: 3.48 MIL/uL — ABNORMAL LOW (ref 4.22–5.81)
Retic Count, Absolute: 97.4 10*3/uL (ref 19.0–186.0)
Retic Ct Pct: 2.8 % (ref 0.4–3.1)

## 2014-05-05 MED ORDER — HYDROMORPHONE HCL 2 MG/ML IJ SOLN
2.5000 mg | Freq: Once | INTRAMUSCULAR | Status: AC
  Administered 2014-05-05: 2.5 mg via INTRAVENOUS
  Filled 2014-05-05: qty 2

## 2014-05-05 MED ORDER — KETOROLAC TROMETHAMINE 15 MG/ML IJ SOLN
15.0000 mg | Freq: Once | INTRAMUSCULAR | Status: AC
  Administered 2014-05-05: 15 mg via INTRAVENOUS
  Filled 2014-05-05: qty 1

## 2014-05-05 MED ORDER — HYDROMORPHONE HCL 2 MG/ML IJ SOLN
2.0000 mg | Freq: Once | INTRAMUSCULAR | Status: AC
  Administered 2014-05-05: 2 mg via INTRAVENOUS
  Filled 2014-05-05: qty 1

## 2014-05-05 MED ORDER — DIPHENHYDRAMINE HCL 50 MG/ML IJ SOLN
12.5000 mg | Freq: Once | INTRAMUSCULAR | Status: AC
  Administered 2014-05-05: 12.5 mg via INTRAVENOUS
  Filled 2014-05-05: qty 1

## 2014-05-05 MED ORDER — HYDROMORPHONE HCL 2 MG/ML IJ SOLN
1.5000 mg | Freq: Once | INTRAMUSCULAR | Status: AC
  Administered 2014-05-05: 1.5 mg via INTRAVENOUS
  Filled 2014-05-05: qty 1

## 2014-05-05 MED ORDER — SODIUM CHLORIDE 0.9 % IV SOLN
INTRAVENOUS | Status: DC
  Administered 2014-05-05: 17:00:00 via INTRAVENOUS

## 2014-05-05 NOTE — ED Notes (Signed)
Pt with sickle cell disease states he has had pain in his shoulders and ribs for the past 2 days. Pt states he has tried taking ms contin for pain, which has not provided relief. Pt denies shortness of breath.States pain is 9/10.

## 2014-05-05 NOTE — ED Notes (Signed)
Awake. Verbally responsive. A/O x4. Resp even and unlabored. No audible adventitious breath sounds noted. ABC's intact. IV infusing NS at 19050ml/hr without difficulty. Pt watching football games. NAD noted.

## 2014-05-05 NOTE — ED Notes (Signed)
Awake. Verbally responsive. A/O x4. Resp even and unlabored. No audible adventitious breath sounds noted. ABC's intact. IV saline lock patent and intact. 

## 2014-05-05 NOTE — ED Provider Notes (Signed)
CSN: [id number]     Arrival date & time 05/05/14  1607 History   First MD Initiated Contact with Patient 05/05/14 1632     Chief Complaint  Patient presents with  . Sickle Cell Pain Crisis     (Consider location/radiation/quality/duration/timing/severity/associated sxs/prior Treatment) HPI   39 year old male with bilateral shoulder right-sided chest pain. Gradual onset about 2 days ago and progressively worsening. Pain is worse in the right shoulder. He denies any trauma. He has a past history of sickle cell anemia. He states that current symptoms feel similar to prior vaso-occlusive crises. No fevers or chills. No shortness of breath. He has been taking MS Contin and immediately release morphine without any significant relief at home. No unusual leg pain or swelling. No dizziness or lightheadedness. No cough.  Past Medical History  Diagnosis Date  . Sickle cell crisis   . Blood transfusion   . Avascular necrosis of hip     bilateral  . Infection of bone, shoulder region     left shoulder  . Pneumonia   . Avascular necrosis of hip, left 08/27/2011   Past Surgical History  Procedure Laterality Date  . Orif right hip fracture  1995  . Joint replacement  2006    right total hip arthroplasty  . Bone graft hip iliac crest     Family History  Problem Relation Age of Onset  . Family history unknown: Yes   History  Substance Use Topics  . Smoking status: Current Some Day Smoker -- 0.50 packs/day    Types: Cigarettes    Last Attempt to Quit: 01/27/2012  . Smokeless tobacco: Never Used  . Alcohol Use: No    Review of Systems  All systems reviewed and negative, other than as noted in HPI.   Allergies  Review of patient's allergies indicates no known allergies.  Home Medications   Prior to Admission medications   Medication Sig Start Date End Date Taking? Authorizing Provider  folic acid (FOLVITE) 1 MG tablet Take 1 mg by mouth daily.   Yes Historical Provider, MD  morphine  (MS CONTIN) 60 MG 12 hr tablet Take 1 tablet (60 mg total) by mouth 2 (two) times daily. Patient taking differently: Take 60 mg by mouth 2 (two) times daily as needed for pain.  11/20/11  Yes Grayce Sessions, NP  morphine (MSIR) 30 MG tablet Take 30 mg by mouth every 4 (four) hours as needed for severe pain (pain).   Yes Historical Provider, MD  promethazine (PHENERGAN) 25 MG tablet Take 25 mg by mouth every 6 (six) hours as needed for nausea (nausea).    Yes Historical Provider, MD   BP 117/79 mmHg  Pulse 72  Temp(Src) 98.5 F (36.9 C) (Oral)  Resp 19  SpO2 96% Physical Exam  Constitutional: He appears well-developed and well-nourished. No distress.  HENT:  Head: Normocephalic and atraumatic.  Eyes: Conjunctivae are normal. Right eye exhibits no discharge. Left eye exhibits no discharge.  Neck: Neck supple.  Cardiovascular: Normal rate, regular rhythm and normal heart sounds.  Exam reveals no gallop and no friction rub.   No murmur heard. Pulmonary/Chest: Effort normal and breath sounds normal. No respiratory distress.  Abdominal: Soft. He exhibits no distension. There is no tenderness.  Musculoskeletal: He exhibits no edema or tenderness.  Neurological: He is alert.  Skin: Skin is warm and dry.  Psychiatric: He has a normal mood and affect. His behavior is normal. Thought content normal.  Nursing note and vitals reviewed.  ED Course  Procedures (including critical care time) Labs Review Labs Reviewed  CBC WITH DIFFERENTIAL - Abnormal; Notable for the following:    WBC 12.0 (*)    RBC 3.48 (*)    Hemoglobin 11.5 (*)    HCT 31.5 (*)    MCHC 36.5 (*)    All other components within normal limits  COMPREHENSIVE METABOLIC PANEL - Abnormal; Notable for the following:    Glucose, Bld 103 (*)    Total Bilirubin 2.4 (*)    All other components within normal limits  RETICULOCYTES - Abnormal; Notable for the following:    RBC. 3.48 (*)    All other components within normal  limits    Imaging Review Dg Chest 2 View  05/05/2014   CLINICAL DATA:  Chest pain between the shoulders in ribs for the past 2 days. History of sickle cell disease. Shortness of breath.  EXAM: CHEST  2 VIEW  COMPARISON:  02/06/2014  FINDINGS: Normal heart size and pulmonary vascularity. Scarring in the right mid lung and left lung base. No focal airspace disease or consolidation in the lungs. No blunting of costophrenic angles. No pneumothorax. Sclerosis in the left humeral head suggesting avascular necrosis.  IMPRESSION: Chronic scarring in the lungs. No evidence of active pulmonary disease.   Electronically Signed   By: Burman NievesWilliam  Stevens M.D.   On: 05/05/2014 23:34     EKG Interpretation   Date/Time:  Saturday May 05 2014 17:07:58 EST Ventricular Rate:  78 PR Interval:  138 QRS Duration: 86 QT Interval:  380 QTC Calculation: 433 R Axis:   44 Text Interpretation:  Sinus rhythm ST elev, probable normal early repol  pattern No significant change since last tracing Confirmed by Brandom Kerwin  MD,  Brenan [place] ([id number]) on 05/05/2014 11:03:42 PM      MDM   Final diagnoses:  Chest pain  Sickle cell crisis    39 year old male with bilateral shoulder and chest pain. Atypical for ACS. EKG with no acute findings. Doubt pulmonary embolism. Not tachycardic. O2 sats on room air. Denies shortness of breath. No clinical signs of DVT. He is afebrile. Chest x-ray without acute findings. Repeated doses of Dilaudid without significant pain relief. Will admit for further symptomatic treatment.    Raeford RazorStephen Boniface Goffe, MD 05/07/14 1106

## 2014-05-05 NOTE — ED Notes (Signed)
Awake. Verbally responsive. A/O x4. Resp even and unlabored. No audible adventitious breath sounds noted. ABC's intact. IV infusing NS without difficulty. 

## 2014-05-05 NOTE — ED Notes (Signed)
Patient transported to X-ray 

## 2014-05-06 ENCOUNTER — Encounter (HOSPITAL_COMMUNITY): Payer: Self-pay | Admitting: Internal Medicine

## 2014-05-06 DIAGNOSIS — D5700 Hb-SS disease with crisis, unspecified: Principal | ICD-10-CM

## 2014-05-06 DIAGNOSIS — Z87891 Personal history of nicotine dependence: Secondary | ICD-10-CM

## 2014-05-06 LAB — RETICULOCYTES
RBC.: 2.98 MIL/uL — ABNORMAL LOW (ref 4.22–5.81)
Retic Count, Absolute: 92.4 10*3/uL (ref 19.0–186.0)
Retic Ct Pct: 3.1 % (ref 0.4–3.1)

## 2014-05-06 MED ORDER — HYDROMORPHONE 2 MG/ML HIGH CONCENTRATION IV PCA SOLN
INTRAVENOUS | Status: DC
  Administered 2014-05-06 (×2): 2 mg via INTRAVENOUS
  Administered 2014-05-06: 3 mg via INTRAVENOUS

## 2014-05-06 MED ORDER — ONDANSETRON HCL 4 MG PO TABS
4.0000 mg | ORAL_TABLET | ORAL | Status: DC | PRN
  Filled 2014-05-06: qty 1

## 2014-05-06 MED ORDER — POLYETHYLENE GLYCOL 3350 17 G PO PACK
17.0000 g | PACK | Freq: Every day | ORAL | Status: DC | PRN
  Filled 2014-05-06: qty 1

## 2014-05-06 MED ORDER — NALOXONE HCL 0.4 MG/ML IJ SOLN
0.4000 mg | INTRAMUSCULAR | Status: DC | PRN
  Filled 2014-05-06: qty 1

## 2014-05-06 MED ORDER — HYDROCODONE-ACETAMINOPHEN 5-325 MG PO TABS: 1.0000 | ORAL_TABLET | ORAL | Status: DC | PRN

## 2014-05-06 MED ORDER — SODIUM CHLORIDE 0.9 % IV SOLN
INTRAVENOUS | Status: DC
  Administered 2014-05-06: 1000 mL via INTRAVENOUS
  Administered 2014-05-06 – 2014-05-08 (×4): via INTRAVENOUS

## 2014-05-06 MED ORDER — SODIUM CHLORIDE 0.9 % IV SOLN
12.5000 mg | Freq: Four times a day (QID) | INTRAVENOUS | Status: DC | PRN
  Filled 2014-05-06 (×4): qty 0.25

## 2014-05-06 MED ORDER — ONDANSETRON HCL 4 MG/2ML IJ SOLN
4.0000 mg | INTRAMUSCULAR | Status: DC | PRN
  Administered 2014-05-06 – 2014-05-09 (×7): 4 mg via INTRAVENOUS
  Filled 2014-05-06 (×4): qty 2

## 2014-05-06 MED ORDER — HYDROMORPHONE HCL 1 MG/ML IJ SOLN
1.0000 mg | INTRAMUSCULAR | Status: DC | PRN
  Administered 2014-05-06: 1 mg via INTRAVENOUS
  Filled 2014-05-06: qty 1

## 2014-05-06 MED ORDER — ENOXAPARIN SODIUM 40 MG/0.4ML ~~LOC~~ SOLN
40.0000 mg | SUBCUTANEOUS | Status: DC
  Administered 2014-05-06 – 2014-05-09 (×4): 40 mg via SUBCUTANEOUS
  Filled 2014-05-06 (×4): qty 0.4

## 2014-05-06 MED ORDER — DIPHENHYDRAMINE HCL 12.5 MG/5ML PO ELIX: 12.5000 mg | ORAL_SOLUTION | Freq: Four times a day (QID) | ORAL | Status: DC | PRN

## 2014-05-06 MED ORDER — SENNOSIDES-DOCUSATE SODIUM 8.6-50 MG PO TABS
1.0000 | ORAL_TABLET | Freq: Two times a day (BID) | ORAL | Status: DC
  Administered 2014-05-06 – 2014-05-09 (×7): 1 via ORAL
  Filled 2014-05-06 (×10): qty 1

## 2014-05-06 MED ORDER — PANTOPRAZOLE SODIUM 40 MG IV SOLR
40.0000 mg | Freq: Every day | INTRAVENOUS | Status: DC
  Administered 2014-05-06: 40 mg via INTRAVENOUS
  Filled 2014-05-06 (×4): qty 40

## 2014-05-06 MED ORDER — KETOROLAC TROMETHAMINE 15 MG/ML IJ SOLN
30.0000 mg | Freq: Four times a day (QID) | INTRAMUSCULAR | Status: DC
  Administered 2014-05-06 (×3): 30 mg via INTRAVENOUS
  Filled 2014-05-06 (×7): qty 2

## 2014-05-06 MED ORDER — PANTOPRAZOLE SODIUM 40 MG PO TBEC
40.0000 mg | DELAYED_RELEASE_TABLET | Freq: Every day | ORAL | Status: DC
  Administered 2014-05-07 – 2014-05-09 (×3): 40 mg via ORAL
  Filled 2014-05-06 (×4): qty 1

## 2014-05-06 MED ORDER — MORPHINE SULFATE CR 60 MG PO TB12: 60.0000 mg | ORAL_TABLET | Freq: Two times a day (BID) | ORAL | Status: DC

## 2014-05-06 MED ORDER — ONDANSETRON HCL 4 MG/2ML IJ SOLN
4.0000 mg | Freq: Three times a day (TID) | INTRAMUSCULAR | Status: DC | PRN
Start: 2014-05-06 — End: 2014-05-06

## 2014-05-06 MED ORDER — FOLIC ACID 1 MG PO TABS
1.0000 mg | ORAL_TABLET | Freq: Every day | ORAL | Status: DC
Start: 2014-05-06 — End: 2014-05-09
  Administered 2014-05-06 – 2014-05-09 (×4): 1 mg via ORAL
  Filled 2014-05-06 (×4): qty 1

## 2014-05-06 MED ORDER — SODIUM CHLORIDE 0.9 % IJ SOLN: 9.0000 mL | INTRAMUSCULAR | Status: DC | PRN

## 2014-05-06 MED ORDER — HYDROMORPHONE 2 MG/ML HIGH CONCENTRATION IV PCA SOLN
INTRAVENOUS | Status: DC
  Administered 2014-05-06 (×2): 3.5 mg via INTRAVENOUS
  Administered 2014-05-06: 3.6 mg via INTRAVENOUS
  Administered 2014-05-07: 0.6 mg via INTRAVENOUS
  Administered 2014-05-07: 1.8 mg via INTRAVENOUS
  Administered 2014-05-07: 3 mg via INTRAVENOUS
  Administered 2014-05-07 (×3): 2.4 mg via INTRAVENOUS
  Administered 2014-05-07: 4.2 mg via INTRAVENOUS
  Administered 2014-05-08: 3.6 mg via INTRAVENOUS
  Administered 2014-05-08: 1.2 mg via INTRAVENOUS
  Administered 2014-05-08: 4 mg via INTRAVENOUS
  Administered 2014-05-08: 4.8 mg via INTRAVENOUS
  Administered 2014-05-08: 2 mg via INTRAVENOUS
  Administered 2014-05-08: 3 mg via INTRAVENOUS
  Administered 2014-05-09: 7.2 mg via INTRAVENOUS
  Administered 2014-05-09: 2.4 mg via INTRAVENOUS
  Administered 2014-05-09 (×2): 3.6 mg via INTRAVENOUS
  Administered 2014-05-09: 6 mg via INTRAVENOUS
  Filled 2014-05-06: qty 25

## 2014-05-06 MED ORDER — MORPHINE SULFATE ER 30 MG PO TBCR
60.0000 mg | EXTENDED_RELEASE_TABLET | Freq: Two times a day (BID) | ORAL | Status: DC
  Administered 2014-05-06 – 2014-05-09 (×6): 60 mg via ORAL
  Filled 2014-05-06 (×6): qty 2

## 2014-05-06 MED ORDER — ONDANSETRON HCL 4 MG/2ML IJ SOLN
4.0000 mg | Freq: Four times a day (QID) | INTRAMUSCULAR | Status: DC | PRN
  Filled 2014-05-06 (×3): qty 2

## 2014-05-06 NOTE — ED Notes (Signed)
Awake. Verbally responsive. A/O x4. Resp even and unlabored. No audible adventitious breath sounds noted. ABC's intact. Watching TV. Family at bedside.

## 2014-05-06 NOTE — ED Notes (Signed)
Patient returned from X-ray without distress noted. 

## 2014-05-06 NOTE — H&P (Signed)
PCP: Dorrene German, MD  Sickle cell [name]  Chief Complaint:  Shoulders and rib pain typical for sickle cell crysis  HPI: [name] is a 39 y.o. male   has a past medical history of Sickle cell crisis; Blood transfusion; Avascular necrosis of hip; Infection of bone, shoulder region; Pneumonia; and Avascular necrosis of hip, left (08/27/2011).   Presented with  2-3 days of shoulder and rib pain, denies any nausea or vomiting no chest pain no shortness of breath.  Patient states he attempted to use his home meds but was not able to control pain. IN ER patient got Benadryl, 12.5, Dilaudid 1.5, 2, 2, 2.5 and toradol 15. States he is feeling sleepy but pain not well controlled.  Bilirubin up to 2.4 Hg 11.5 Retic 2.8 % Hospitalist was called for admission for Sickle Cell crisis  Review of Systems:    Pertinent positives include:  Shoulder and rib pain  Constitutional:  No weight loss, night sweats, Fevers, chills, fatigue, weight loss  HEENT:  No headaches, Difficulty swallowing,Tooth/dental problems,Sore throat,  No sneezing, itching, ear ache, nasal congestion, post nasal drip,  Cardio-vascular:  No chest pain, Orthopnea, PND, anasarca, dizziness, palpitations.no Bilateral lower extremity swelling  GI:  No heartburn, indigestion, abdominal pain, nausea, vomiting, diarrhea, change in bowel habits, loss of appetite, melena, blood in stool, hematemesis Resp:  no shortness of breath at rest. No dyspnea on exertion, No excess mucus, no productive cough, No non-productive cough, No coughing up of blood.No change in color of mucus.No wheezing. Skin:  no rash or lesions. No jaundice GU:  no dysuria, change in color of urine, no urgency or frequency. No straining to urinate.  No flank pain.  Musculoskeletal:  No joint pain or no joint swelling. No decreased range of motion. No back pain.  Psych:  No change in mood or affect. No depression or anxiety. No memory loss.  Neuro: no  localizing neurological complaints, no tingling, no weakness, no double vision, no gait abnormality, no slurred speech, no confusion  Otherwise ROS are negative except for above, 10 systems were reviewed  Past Medical History: Past Medical History  Diagnosis Date  . Sickle cell crisis   . Blood transfusion   . Avascular necrosis of hip     bilateral  . Infection of bone, shoulder region     left shoulder  . Pneumonia   . Avascular necrosis of hip, left 08/27/2011   Past Surgical History  Procedure Laterality Date  . Orif right hip fracture  1995  . Joint replacement  2006    right total hip arthroplasty  . Bone graft hip iliac crest       Medications: Prior to Admission medications   Medication Sig Start Date End Date Taking? Authorizing Provider  folic acid (FOLVITE) 1 MG tablet Take 1 mg by mouth daily.   Yes Historical Provider, MD  morphine (MS CONTIN) 60 MG 12 hr tablet Take 1 tablet (60 mg total) by mouth 2 (two) times daily. Patient taking differently: Take 60 mg by mouth 2 (two) times daily as needed for pain.  11/20/11  Yes Grayce Sessions, NP  morphine (MSIR) 30 MG tablet Take 30 mg by mouth every 4 (four) hours as needed for severe pain (pain).   Yes Historical Provider, MD  promethazine (PHENERGAN) 25 MG tablet Take 25 mg by mouth every 6 (six) hours as needed for nausea (nausea).    Yes Historical Provider, MD    Allergies:  No Known Allergies  Social History:  Ambulatory   independently   Lives at home   With family     reports that he has been smoking Cigarettes.  He has been smoking about 0.50 packs per day. He has never used smokeless tobacco. He reports that he does not drink alcohol or use illicit drugs.    Family History: He was adopted. Family history is unknown by patient.    Physical Exam: Patient Vitals for the past 24 hrs:  BP Temp Temp src Pulse Resp SpO2  05/05/14 1947 117/79 mmHg - - 72 19 -  05/05/14 1629 124/77 mmHg 98.5 F (36.9  C) Oral 84 16 96 %    1. General:  in No Acute distress 2. Psychological: lethargic but Oriented 3. Head/ENT:    Dry Mucous Membranes                          Head Non traumatic, neck supple                          Normal   Dentition 4. SKIN: normal   Skin turgor,  Skin clean Dry and intact no rash 5. Heart: Regular rate and rhythm no Murmur, Rub or gallop 6. Lungs: Clear to auscultation bilaterally, no wheezes or crackles   7. Abdomen: Soft, non-tender, Non distended 8. Lower extremities: no clubbing, cyanosis, or edema 9. Neurologically Grossly intact, moving all 4 extremities equally 10. MSK: Normal range of motion  body mass index is unknown because there is no weight on file.   Labs on Admission:   Results for orders placed or performed during the hospital encounter of 05/05/14 (from the past 24 hour(s))  CBC with Differential     Status: Abnormal   Collection Time: 05/05/14  5:11 PM  Result Value Ref Range   WBC 12.0 (H) 4.0 - 10.5 K/uL   RBC 3.48 (L) 4.22 - 5.81 MIL/uL   Hemoglobin 11.5 (L) 13.0 - 17.0 g/dL   HCT 16.131.5 (L) 09.639.0 - 04.552.0 %   MCV 90.5 78.0 - 100.0 fL   MCH 33.0 26.0 - 34.0 pg   MCHC 36.5 (H) 30.0 - 36.0 g/dL   RDW 40.914.6 81.111.5 - 91.415.5 %   Platelets 368 150 - 400 K/uL   Neutrophils Relative % 63 43 - 77 %   Neutro Abs 7.7 1.7 - 7.7 K/uL   Lymphocytes Relative 24 12 - 46 %   Lymphs Abs 2.8 0.7 - 4.0 K/uL   Monocytes Relative 8 3 - 12 %   Monocytes Absolute 1.0 0.1 - 1.0 K/uL   Eosinophils Relative 4 0 - 5 %   Eosinophils Absolute 0.5 0.0 - 0.7 K/uL   Basophils Relative 1 0 - 1 %   Basophils Absolute 0.1 0.0 - 0.1 K/uL  Comprehensive metabolic panel     Status: Abnormal   Collection Time: 05/05/14  5:11 PM  Result Value Ref Range   Sodium 140 135 - 145 mmol/L   Potassium 4.0 3.5 - 5.1 mmol/L   Chloride 106 96 - 112 mEq/L   CO2 26 19 - 32 mmol/L   Glucose, Bld 103 (H) 70 - 99 mg/dL   BUN 13 6 - 23 mg/dL   Creatinine, Ser 7.820.77 0.50 - 1.35 mg/dL    Calcium 9.2 8.4 - 95.610.5 mg/dL   Total Protein 8.3 6.0 - 8.3 g/dL   Albumin 4.9 3.5 -  5.2 g/dL   AST 22 0 - 37 U/L   ALT 12 0 - 53 U/L   Alkaline Phosphatase 82 39 - 117 U/L   Total Bilirubin 2.4 (H) 0.3 - 1.2 mg/dL   GFR calc non Af Amer >90 >90 mL/min   GFR calc Af Amer >90 >90 mL/min   Anion gap 8 5 - 15  Reticulocytes     Status: Abnormal   Collection Time: 05/05/14  5:11 PM  Result Value Ref Range   Retic Ct Pct 2.8 0.4 - 3.1 %   RBC. 3.48 (L) 4.22 - 5.81 MIL/uL   Retic Count, Manual 97.4 19.0 - 186.0 K/uL    UA not obtained  No results found for: HGBA1C  CrCl cannot be calculated (Unknown ideal weight.).  BNP (last 3 results) No results for input(s): PROBNP in the last 8760 hours.  Other results:  I have pearsonaly reviewed this: ECG REPORT  Rate: 78  Rhythm: SR ST&T Change: early repol ST changes in V4 v5   There were no vitals filed for this visit.   Cultures:    Component Value Date/Time   SDES SPUTUM 05/16/2011 0209   SPECREQUEST NONE 05/16/2011 0209   CULT NORMAL OROPHARYNGEAL FLORA 05/16/2011 0209   REPTSTATUS 05/18/2011 FINAL 05/16/2011 0209     Radiological Exams on Admission: Dg Chest 2 View  05/05/2014   CLINICAL DATA:  Chest pain between the shoulders in ribs for the past 2 days. History of sickle cell disease. Shortness of breath.  EXAM: CHEST  2 VIEW  COMPARISON:  02/06/2014  FINDINGS: Normal heart size and pulmonary vascularity. Scarring in the right mid lung and left lung base. No focal airspace disease or consolidation in the lungs. No blunting of costophrenic angles. No pneumothorax. Sclerosis in the left humeral head suggesting avascular necrosis.  IMPRESSION: Chronic scarring in the lungs. No evidence of active pulmonary disease.   Electronically Signed   By: Burman Nieves M.D.   On: 05/05/2014 23:34    Chart has been reviewed  Assessment/Plan  39 year old gentleman history of sickle cell presents with sickle cell crisis. Not  controlled with Dilaudid in the emergency department. Being and admitted for pain management  Present on Admission:  . Sickle cell crisis - - will admit per sickle cell protocol, control pain with high-dose PCA, patient has not been taking his more MS Contin on a regular basis see how he responds to PCA prior to restarting the hydrate, provide oxygen.     If develops fever or respiratory symptoms would evaluate for acute chest and initiate antibiotics as needed.  Sickle cell team to assume care in a.m. Prophylaxis:   Lovenox, Protonix  CODE STATUS:  FULL CODE    Other plan as per orders.  I have spent a total of  55 min on this admission  Jeevan Kalla 05/06/2014, 12:06 AM  [facility]  Pager [phone]   after 2 AM please page floor coverage PA If 7AM-7PM, please contact the day team taking care of the patient  [email or web address]  Password [email or web address]

## 2014-05-06 NOTE — ED Notes (Signed)
Awake. Verbally responsive. A/O x4. Resp even and unlabored. No audible adventitious breath sounds noted. ABC's intact.  

## 2014-05-06 NOTE — ED Notes (Signed)
Pt unable to transport at this time d/t MD ordering cardiac monitoring and  Med-surg floor. Secretary aware and waiting bed assignment.

## 2014-05-06 NOTE — Progress Notes (Signed)
Patient admitted from ED with diagnosis of sickle cell crisis and need for pain control.  Pain in shoulders bilaterally.  High concentration PCA started as ordered with patient expressing relief.  Vital signs are stable.  Oriented to room and all questions addressed.  Will continue to monitor for changes in condition.    Kelli HopeBarber, Omaira Mellen M

## 2014-05-06 NOTE — Progress Notes (Signed)
Patient admitted this am with sickle cell painful crisis. Still has pain at 9/10. Will change his Dilaudid bolus dose to 0.6 mg and increase the 2 hour limit to 4 mg. Also add toradol. Continue to monitor.

## 2014-05-07 LAB — CBC WITH DIFFERENTIAL/PLATELET
BASOS ABS: 0.1 10*3/uL (ref 0.0–0.1)
Basophils Relative: 1 % (ref 0–1)
Eosinophils Absolute: 0.6 10*3/uL (ref 0.0–0.7)
Eosinophils Relative: 5 % (ref 0–5)
HCT: 27.7 % — ABNORMAL LOW (ref 39.0–52.0)
HEMOGLOBIN: 9.9 g/dL — AB (ref 13.0–17.0)
LYMPHS PCT: 32 % (ref 12–46)
Lymphs Abs: 3.3 10*3/uL (ref 0.7–4.0)
MCH: 32.5 pg (ref 26.0–34.0)
MCHC: 35.7 g/dL (ref 30.0–36.0)
MCV: 90.8 fL (ref 78.0–100.0)
Monocytes Absolute: 1 10*3/uL (ref 0.1–1.0)
Monocytes Relative: 10 % (ref 3–12)
NEUTROS ABS: 5.6 10*3/uL (ref 1.7–7.7)
NEUTROS PCT: 53 % (ref 43–77)
Platelets: 270 10*3/uL (ref 150–400)
RBC: 3.05 MIL/uL — AB (ref 4.22–5.81)
RDW: 15.2 % (ref 11.5–15.5)
WBC: 10.6 10*3/uL — ABNORMAL HIGH (ref 4.0–10.5)

## 2014-05-07 LAB — COMPREHENSIVE METABOLIC PANEL
ALK PHOS: 74 U/L (ref 39–117)
ALT: 10 U/L (ref 0–53)
ANION GAP: 7 (ref 5–15)
AST: 17 U/L (ref 0–37)
Albumin: 4.1 g/dL (ref 3.5–5.2)
BUN: 9 mg/dL (ref 6–23)
CHLORIDE: 103 meq/L (ref 96–112)
CO2: 28 mmol/L (ref 19–32)
Calcium: 8.9 mg/dL (ref 8.4–10.5)
Creatinine, Ser: 0.83 mg/dL (ref 0.50–1.35)
GFR calc non Af Amer: >90 mL/min (ref >90)
Glucose, Bld: 92 mg/dL (ref 70–99)
POTASSIUM: 3.7 mmol/L (ref 3.5–5.1)
Sodium: 138 mmol/L (ref 135–145)
TOTAL PROTEIN: 7 g/dL (ref 6.0–8.3)
Total Bilirubin: 2.9 mg/dL — ABNORMAL HIGH (ref 0.3–1.2)

## 2014-05-07 MED ORDER — KETOROLAC TROMETHAMINE 30 MG/ML IJ SOLN
30.0000 mg | Freq: Four times a day (QID) | INTRAMUSCULAR | Status: DC
  Administered 2014-05-07 – 2014-05-09 (×10): 30 mg via INTRAVENOUS
  Filled 2014-05-07 (×9): qty 1

## 2014-05-07 NOTE — Progress Notes (Signed)
Pt used 14.4 mg in the last 24 hrs of Dilaudid PCA.

## 2014-05-07 NOTE — Progress Notes (Signed)
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 Patient is doing better and is currently on Dilaudid PCA where he used 14.4 mg in the last 24 hours with 32 demands and 30 deliveries. No fever, no SOB, no cough, no NVD. Patient is able to get up and walk around the floor today. His pain is localized to his lower back and legs and is at 7/10.   Objective: Vital signs in last 24 hours: Temp:  [98 F (36.7 C)-98.2 F (36.8 C)] 98 F (36.7 C) (01/11 1020) Pulse Rate:  [53-62] 62 (01/11 1020) Resp:  [8-16] 16 (01/11 1020) BP: (99-114)/(49-74) 99/49 mmHg (01/11 1020) SpO2:  [91 %-98 %] 96 % (01/11 1020) Weight:  [76.658 kg (169 lb)] 76.658 kg (169 lb) (01/11 0534) Weight change: -0.181 kg (-6.4 oz) Last BM Date: 05/05/14  Intake/Output from previous day: 01/10 0701 - 01/11 0700 In: 3110.2 [P.O.:1357; I.V.:1753.2] Out: 3800 [Urine:3800] Intake/Output this shift:    General appearance: alert, cooperative and no distress Eyes: conjunctivae/corneas clear. PERRL, EOM's intact. Fundi benign. Neck: no adenopathy, no carotid bruit, no JVD, supple, symmetrical, trachea midline and thyroid not enlarged, symmetric, no tenderness/mass/nodules Back: symmetric, no curvature. ROM normal. No CVA tenderness. Resp: clear to auscultation bilaterally Chest wall: no tenderness Cardio: regular rate and rhythm, S1, S2 normal, no murmur, click, rub or gallop GI: soft, non-tender; bowel sounds normal; no masses,  no organomegaly Extremities: extremities normal, atraumatic, no cyanosis or edema Pulses: 2+ and symmetric Skin: Skin color, texture, turgor normal. No rashes or lesions Neurologic: Grossly normal  Lab Results:  Recent Labs  05/05/14 1711 05/07/14 0640  WBC 12.0* 10.6*  HGB 11.5* 9.9*  HCT 31.5* 27.7*  PLT 368 270   BMET  Recent Labs  05/05/14 1711 05/07/14 0640  NA 140 138  K 4.0 3.7  CL 106 103  CO2 26 28  GLUCOSE 103* 92  BUN 13 9   CREATININE 0.77 0.83  CALCIUM 9.2 8.9    Studies/Results: Dg Chest 2 View  05/05/2014   CLINICAL DATA:  Chest pain between the shoulders in ribs for the past 2 days. History of sickle cell disease. Shortness of breath.  EXAM: CHEST  2 VIEW  COMPARISON:  02/06/2014  FINDINGS: Normal heart size and pulmonary vascularity. Scarring in the right mid lung and left lung base. No focal airspace disease or consolidation in the lungs. No blunting of costophrenic angles. No pneumothorax. Sclerosis in the left humeral head suggesting avascular necrosis.  IMPRESSION: Chronic scarring in the lungs. No evidence of active pulmonary disease.   Electronically Signed   By: Burman NievesWilliam  Stevens M.D.   On: 05/05/2014 23:34    Medications: I have reviewed the patient's current medications.  Assessment/Plan: A 39 yo man admitted with sickle cell painful crisis.  #1 Sickle Cell Painful Crisis: Patient responding to current PCA dose. I will re-start his home long acting MS contin as well as continue with Toradol. May be able to discharge home in the next 24 to 48 hours if improved.  #2 Sickle Cell anemia: H/H stable. Will continue.  #3 Leucocytosis: due to VOC.  #4  Dehydration: hydrated  LOS: 2 days   GARBA,[name] 05/07/2014, 11:29 AM

## 2014-05-08 DIAGNOSIS — K5901 Slow transit constipation: Secondary | ICD-10-CM

## 2014-05-08 LAB — COMPREHENSIVE METABOLIC PANEL
ALBUMIN: 4.6 g/dL (ref 3.5–5.2)
ALT: 14 U/L (ref 0–53)
ANION GAP: 8 (ref 5–15)
AST: 33 U/L (ref 0–37)
Alkaline Phosphatase: 80 U/L (ref 39–117)
BUN: 8 mg/dL (ref 6–23)
CALCIUM: 9.2 mg/dL (ref 8.4–10.5)
CO2: 27 mmol/L (ref 19–32)
CREATININE: 0.68 mg/dL (ref 0.50–1.35)
Chloride: 105 mEq/L (ref 96–112)
GFR calc Af Amer: >90 mL/min (ref >90)
GFR calc non Af Amer: >90 mL/min (ref >90)
Glucose, Bld: 86 mg/dL (ref 70–99)
Potassium: 3.7 mmol/L (ref 3.5–5.1)
Sodium: 140 mmol/L (ref 135–145)
Total Bilirubin: 3.1 mg/dL — ABNORMAL HIGH (ref 0.3–1.2)
Total Protein: 7.9 g/dL (ref 6.0–8.3)

## 2014-05-08 LAB — CBC WITH DIFFERENTIAL/PLATELET
BASOS PCT: 1 % (ref 0–1)
Basophils Absolute: 0.1 10*3/uL (ref 0.0–0.1)
Eosinophils Absolute: 0.7 10*3/uL (ref 0.0–0.7)
Eosinophils Relative: 6 % — ABNORMAL HIGH (ref 0–5)
HCT: 28.6 % — ABNORMAL LOW (ref 39.0–52.0)
Hemoglobin: 10.1 g/dL — ABNORMAL LOW (ref 13.0–17.0)
Lymphocytes Relative: 25 % (ref 12–46)
Lymphs Abs: 3 10*3/uL (ref 0.7–4.0)
MCH: 32.3 pg (ref 26.0–34.0)
MCHC: 35.3 g/dL (ref 30.0–36.0)
MCV: 91.4 fL (ref 78.0–100.0)
Monocytes Absolute: 1 10*3/uL (ref 0.1–1.0)
Monocytes Relative: 9 % (ref 3–12)
Neutro Abs: 7.2 10*3/uL (ref 1.7–7.7)
Neutrophils Relative %: 59 % (ref 43–77)
PLATELETS: 302 10*3/uL (ref 150–400)
RBC: 3.13 MIL/uL — ABNORMAL LOW (ref 4.22–5.81)
RDW: 15.5 % (ref 11.5–15.5)
WBC: 12 10*3/uL — AB (ref 4.0–10.5)

## 2014-05-08 MED ORDER — LACTULOSE 10 GM/15ML PO SOLN
10.0000 g | Freq: Once | ORAL | Status: AC
  Administered 2014-05-09: 10 g via ORAL
  Filled 2014-05-08: qty 15

## 2014-05-08 MED ORDER — DIPHENHYDRAMINE HCL 25 MG PO CAPS: 25.0000 mg | ORAL_CAPSULE | Freq: Four times a day (QID) | ORAL | Status: DC | PRN

## 2014-05-08 MED ORDER — HYDROMORPHONE HCL 2 MG/ML IJ SOLN: 2.0000 mg | INTRAMUSCULAR | Status: DC | PRN

## 2014-05-08 MED ORDER — POLYETHYLENE GLYCOL 3350 17 G PO PACK
17.0000 g | PACK | Freq: Every day | ORAL | Status: DC
  Administered 2014-05-08 – 2014-05-09 (×2): 17 g via ORAL
  Filled 2014-05-08 (×2): qty 1

## 2014-05-08 NOTE — Progress Notes (Signed)
SICKLE CELL SERVICE PROGRESS NOTE  [name] WUJ:[id number] DOB: [birth year] DOA: 05/05/2014 PCP: Dorrene German, MD  Assessment/Plan: Active Problems:   History of tobacco abuse   Sickle cell crisis  1. Hb [place] with crisis: Pt reports pain in shoulder and ribs rated at 8-9/10. There was some difficulty with the PCA last night and it had to be replaced. I will discontinue his immediate release oral analgesics and add clinician assisted doses to the PCA regimen. Continue MS Contin and Toradol. Decrease IVF to [facility] rate.  2. Leukocytosis: No signs of infection. Likely related to crisis. 3. Constipation: Will schedule Miralax.   Code Status: Full Code Family Communication: N/A Disposition Plan: Not yet ready for discharge  Taniyah Ballow A.  Pager [phone]. If 7PM-7AM, please contact night-coverage.  05/08/2014, 1:13 PM  LOS: 3 days   Consultants:  None  Procedures:  None  Antibiotics:  None  HPI/Subjective: Pt reports pain in shoulder and ribs rated at 8-9/10. He has not had a BM since admission.  Objective: Filed Vitals:   05/08/14 0649 05/08/14 0650 05/08/14 1005 05/08/14 1300  BP:   123/79   Pulse:   67   Temp:   98.1 F (36.7 C)   TempSrc:   Oral   Resp: Height:      Weight:      SpO2:   96% 90%   Weight change:   Intake/Output Summary (Last 24 hours) at 05/08/14 1313 Last data filed at 05/08/14 1019  Gross per 24 hour  Intake 4947.92 ml  Output   3100 ml  Net 1847.92 ml    General: Alert, awake, oriented x3, in no acute distress.  HEENT: [place]/AT PEERL, EOMI, mild icterus. OROPHARYNX:  Moist, No exudate/ erythema/lesions.  Heart: Regular rate and rhythm, without murmurs, rubs, gallops.  Lungs: Clear to auscultation, no wheezing or rhonchi noted. No increased vocal fremitus resonant to percussion  Abdomen: Soft, nontender, nondistended, positive bowel sounds, no masses no hepatosplenomegaly noted.  Neuro: No focal neurological deficits  noted cranial nerves II through XII grossly intact.  Strength normal in bilateral upper and lower extremities. Musculoskeletal: No warm swelling or erythema around joints, no spinal tenderness noted. Psychiatric: Patient alert and oriented x3, good insight and cognition, good recent to remote recall. Lymph node survey: No cervical axillary or inguinal lymphadenopathy noted.   Data Reviewed: Basic Metabolic Panel:  Recent Labs Lab 05/05/14 1711 05/07/14 0640 05/08/14 0420  NA 140 138 140  K 4.0 3.7 3.7  CL 106 103 105  CO2 GLUCOSE 103* 92 86  BUN CREATININE 0.77 0.83 0.68  CALCIUM 9.2 8.9 9.2   Liver Function Tests:  Recent Labs Lab 05/05/14 1711 05/07/14 0640 05/08/14 0420  AST 22 17 33  ALT ALKPHOS 82 74 80  BILITOT 2.4* 2.9* 3.1*  PROT 8.3 7.0 7.9  ALBUMIN 4.9 4.1 4.6   No results for input(s): LIPASE, AMYLASE in the last 168 hours. No results for input(s): AMMONIA in the last 168 hours. CBC:  Recent Labs Lab 05/05/14 1711 05/07/14 0640 05/08/14 0420  WBC 12.0* 10.6* 12.0*  NEUTROABS 7.7 5.6 7.2  HGB 11.5* 9.9* 10.1*  HCT 31.5* 27.7* 28.6*  MCV 90.5 90.8 91.4  PLT 368 270 302   Cardiac Enzymes: No results for input(s): CKTOTAL, CKMB, CKMBINDEX, TROPONINI in the last 168 hours. BNP (last 3 results) No results for input(s): PROBNP in the last  8760 hours. CBG: No results for input(s): GLUCAP in the last 168 hours.  No results found for this or any previous visit (from the past 240 hour(s)).   Studies: Dg Chest 2 View  05/05/2014   CLINICAL DATA:  Chest pain between the shoulders in ribs for the past 2 days. History of sickle cell disease. Shortness of breath.  EXAM: CHEST  2 VIEW  COMPARISON:  02/06/2014  FINDINGS: Normal heart size and pulmonary vascularity. Scarring in the right mid lung and left lung base. No focal airspace disease or consolidation in the lungs. No blunting of costophrenic angles. No pneumothorax. Sclerosis  in the left humeral head suggesting avascular necrosis.  IMPRESSION: Chronic scarring in the lungs. No evidence of active pulmonary disease.   Electronically Signed   By: Burman NievesWilliam  Stevens M.D.   On: 05/05/2014 23:34    Scheduled Meds: . enoxaparin (LOVENOX) injection  40 mg Subcutaneous Q24H  . folic acid  1 mg Oral Daily  . HYDROmorphone PCA 2 mg/mL   Intravenous 6 times per day  . ketorolac  30 mg Intravenous 4 times per day  . morphine  60 mg Oral Q12H  . pantoprazole  40 mg Oral Daily   Or  . pantoprazole (PROTONIX) IV  40 mg Intravenous Daily  . polyethylene glycol  17 g Oral Daily  . senna-docusate  1 tablet Oral BID   Continuous Infusions: . sodium chloride 10 mL/hr at 05/08/14 1019    Time spent 35 minutes.

## 2014-05-09 MED ORDER — MORPHINE SULFATE 30 MG PO TABS
30.0000 mg | ORAL_TABLET | ORAL | Status: DC
  Administered 2014-05-09 (×2): 30 mg via ORAL
  Filled 2014-05-09 (×2): qty 1

## 2014-05-09 NOTE — Progress Notes (Signed)
[name]'s PCA had 7 cc of 2:1 dilaudid, which was wasted in the sink.

## 2014-05-09 NOTE — Progress Notes (Signed)
This am, Patient ambulated down the hallway to the "double doors" from his room and back. Patient's oxygen went from 92% on RA to 88% on RA.  Patient's oxygen went back up to 91% when back in bed.Kenton KingfisherMills, Henry Demeritt [place]

## 2014-05-09 NOTE — Discharge Summary (Signed)
[name] E Mcwethy MRN: [id number] DOB/AGE: [birth year] 39 y.o.  Admit date: 05/05/2014 Discharge date: 05/09/2014  Primary Care Physician:  Dorrene GermanAVBUERE,EDWIN A, MD   Discharge Diagnoses:   Patient Active Problem List   Diagnosis Date Noted  . Sickle cell crisis 10/24/2013  . Sickle cell disease, type [place] 10/18/2012  . Sickle cell pain crisis 10/13/2012  . History of tobacco abuse 10/13/2012  . Hypokalemia 05/02/2012  . Leukocytosis 05/02/2012  . Anemia 11/08/2011  . Avascular necrosis of hip, left 08/27/2011  . Elevated brain natriuretic peptide (BNP) level 08/25/2011  . Acute chest syndrome(517.3) 05/15/2011  . Tobacco abuse 05/15/2011  . Constipation 05/14/2011  . Sickle cell anemia with crisis 05/13/2011    DISCHARGE MEDICATION:   Medication List    TAKE these medications        folic acid 1 MG tablet  Commonly known as:  FOLVITE  Take 1 mg by mouth daily.     morphine 30 MG tablet  Commonly known as:  MSIR  Take 30 mg by mouth every 4 (four) hours as needed for severe pain (pain).     morphine 60 MG 12 hr tablet  Commonly known as:  MS CONTIN  Take 1 tablet (60 mg total) by mouth 2 (two) times daily.     promethazine 25 MG tablet  Commonly known as:  PHENERGAN  Take 25 mg by mouth every 6 (six) hours as needed for nausea (nausea).         SIGNIFICANT DIAGNOSTIC STUDIES:  Dg Chest 2 View  05/05/2014   CLINICAL DATA:  Chest pain between the shoulders in ribs for the past 2 days. History of sickle cell disease. Shortness of breath.  EXAM: CHEST  2 VIEW  COMPARISON:  02/06/2014  FINDINGS: Normal heart size and pulmonary vascularity. Scarring in the right mid lung and left lung base. No focal airspace disease or consolidation in the lungs. No blunting of costophrenic angles. No pneumothorax. Sclerosis in the left humeral head suggesting avascular necrosis.  IMPRESSION: Chronic scarring in the lungs. No evidence of active pulmonary disease.   Electronically Signed   By: Burman NievesWilliam   Stevens M.D.   On: 05/05/2014 23:34     No results found for this or any previous visit (from the past 240 hour(s)).  BRIEF ADMITTING H & P: Pt with Hb [name] was admitted with uncomplicated acute sickle cell crisis. His pain started 2-3 days before presentation to the ED. He used his oral analgesics but to no avail thus presented to the ED for more intense treatment. He had no associated symptoms.   Hospital Course:  Present on Admission:  . Sickle cell crisis: Pt was treated with IV Dilaudid via PCA pump, Toradol and continued MS Contin. He was transitioned to oral analgesics with a decrease in IV analgesics as he was transitioned to oral analgesics. At the time of discharge his pain was 5-6/10 which was approaching his baseline of 4/10. Pt does not usually require analgesics on a daily basis. I have advised him to take OS Contin q 12 hours and MS IR around the clock.   Disposition and Follow-up:  F/U with PCP in 1 week or as needed.     Discharge Instructions    Activity as tolerated - No restrictions    Complete by:  As directed      Diet general    Complete by:  As directed            DISCHARGE EXAM: General: Alert,  awake, oriented x3, in mild distress.  Vital Signs: BP 108/61, HR 60, T 98.4 F (36.9 C), temperature source Oral, RR 10, height  (1.88 m), weight 169 lb (76.658 kg), SpO2 90 %. HEENT: [place]/AT PEERL, EOMI, anicteric Neck: Trachea midline, no masses, no thyromegal,y no JVD, no carotid bruit OROPHARYNX: Moist, No exudate/ erythema/lesions.  Heart: Regular rate and rhythm, without murmurs, rubs, gallops, PMI non-displaced.  Lungs: Clear to auscultation, no wheezing or rhonchi noted.  Abdomen: Soft, nontender, nondistended, positive bowel sounds, no masses no hepatosplenomegaly noted.  Neuro: No focal neurological deficits noted cranial nerves II through XII grossly intact. Strength at baseline in bilateral upper and lower extremities. Musculoskeletal: No  warm swelling or erythema around joints, no spinal tenderness noted. Psychiatric: Patient alert and oriented x3, good insight and cognition, good recent to remote recall. Lymph node survey: No cervical axillary or inguinal lymphadenopathy noted.     Recent Labs  05/07/14 0640 05/08/14 0420  NA 138 140  K 3.7 3.7  CL 103 105  CO2 28 27  GLUCOSE 92 86  BUN 9 8  CREATININE 0.83 0.68  CALCIUM 8.9 9.2    Recent Labs  05/07/14 0640 05/08/14 0420  AST 17 33  ALT 10 14  ALKPHOS 74 80  BILITOT 2.9* 3.1*  PROT 7.0 7.9  ALBUMIN 4.1 4.6   No results for input(s): LIPASE, AMYLASE in the last 72 hours.  Recent Labs  05/07/14 0640 05/08/14 0420  WBC 10.6* 12.0*  NEUTROABS 5.6 7.2  HGB 9.9* 10.1*  HCT 27.7* 28.6*  MCV 90.8 91.4  PLT 270 302   Total time spent including face to face and decision making was greater than 30 minutes.  Signed: Euel Castile A. 05/09/2014, 4:30 PM

## 2014-05-31 ENCOUNTER — Emergency Department (HOSPITAL_COMMUNITY): Payer: Medicare Other

## 2014-05-31 ENCOUNTER — Inpatient Hospital Stay (HOSPITAL_COMMUNITY)
Admission: EM | Admit: 2014-05-31 | Discharge: 2014-06-04 | DRG: 812 | Disposition: A | Discharge status: Home / Self Care | Payer: Medicare Other | Attending: Internal Medicine | Admitting: Internal Medicine

## 2014-05-31 ENCOUNTER — Encounter (HOSPITAL_COMMUNITY): Payer: Self-pay | Admitting: Emergency Medicine

## 2014-05-31 DIAGNOSIS — M87052 Idiopathic aseptic necrosis of left femur: Secondary | ICD-10-CM

## 2014-05-31 DIAGNOSIS — F17210 Nicotine dependence, cigarettes, uncomplicated: Secondary | ICD-10-CM | POA: Diagnosis present

## 2014-05-31 DIAGNOSIS — D5700 Hb-SS disease with crisis, unspecified: Principal | ICD-10-CM | POA: Diagnosis present

## 2014-05-31 DIAGNOSIS — K5900 Constipation, unspecified: Secondary | ICD-10-CM | POA: Diagnosis present

## 2014-05-31 DIAGNOSIS — D72829 Elevated white blood cell count, unspecified: Secondary | ICD-10-CM | POA: Diagnosis present

## 2014-05-31 DIAGNOSIS — Z87891 Personal history of nicotine dependence: Secondary | ICD-10-CM

## 2014-05-31 DIAGNOSIS — Z96641 Presence of right artificial hip joint: Secondary | ICD-10-CM | POA: Diagnosis present

## 2014-05-31 LAB — CBC WITH DIFFERENTIAL/PLATELET
BASOS ABS: 0.1 10*3/uL (ref 0.0–0.1)
Basophils Relative: 1 % (ref 0–1)
EOS ABS: 0.6 10*3/uL (ref 0.0–0.7)
EOS PCT: 5 % (ref 0–5)
HCT: 30.2 % — ABNORMAL LOW (ref 39.0–52.0)
Hemoglobin: 11.1 g/dL — ABNORMAL LOW (ref 13.0–17.0)
LYMPHS ABS: 3.6 10*3/uL (ref 0.7–4.0)
Lymphocytes Relative: 28 % (ref 12–46)
MCH: 33.8 pg (ref 26.0–34.0)
MCHC: 36.8 g/dL — ABNORMAL HIGH (ref 30.0–36.0)
MCV: 92.1 fL (ref 78.0–100.0)
Monocytes Absolute: 1.1 10*3/uL — ABNORMAL HIGH (ref 0.1–1.0)
Monocytes Relative: 9 % (ref 3–12)
NEUTROS PCT: 57 % (ref 43–77)
Neutro Abs: 7.3 10*3/uL (ref 1.7–7.7)
PLATELETS: 319 10*3/uL (ref 150–400)
RBC: 3.28 MIL/uL — AB (ref 4.22–5.81)
RDW: 14.2 % (ref 11.5–15.5)
WBC: 12.7 10*3/uL — ABNORMAL HIGH (ref 4.0–10.5)

## 2014-05-31 LAB — COMPREHENSIVE METABOLIC PANEL
ALBUMIN: 4.7 g/dL (ref 3.5–5.2)
ALT: 11 U/L (ref 0–53)
ANION GAP: 7 (ref 5–15)
AST: 31 U/L (ref 0–37)
Alkaline Phosphatase: 77 U/L (ref 39–117)
BUN: 15 mg/dL (ref 6–23)
CALCIUM: 9.4 mg/dL (ref 8.4–10.5)
CHLORIDE: 106 mmol/L (ref 96–112)
CO2: 24 mmol/L (ref 19–32)
Creatinine, Ser: 0.7 mg/dL (ref 0.50–1.35)
GFR calc Af Amer: >90 mL/min (ref >90)
GLUCOSE: 103 mg/dL — AB (ref 70–99)
POTASSIUM: 4.2 mmol/L (ref 3.5–5.1)
SODIUM: 137 mmol/L (ref 135–145)
TOTAL PROTEIN: 8.1 g/dL (ref 6.0–8.3)
Total Bilirubin: 1.9 mg/dL — ABNORMAL HIGH (ref 0.3–1.2)

## 2014-05-31 LAB — RETICULOCYTES
RBC.: 3.29 MIL/uL — ABNORMAL LOW (ref 4.22–5.81)
RETIC COUNT ABSOLUTE: 92.1 10*3/uL (ref 19.0–186.0)
Retic Ct Pct: 2.8 % (ref 0.4–3.1)

## 2014-05-31 LAB — LACTATE DEHYDROGENASE: LDH: 226 U/L (ref 94–250)

## 2014-05-31 LAB — MAGNESIUM: Magnesium: 1.9 mg/dL (ref 1.5–2.5)

## 2014-05-31 MED ORDER — SODIUM CHLORIDE 0.9 % IV SOLN
12.5000 mg | Freq: Four times a day (QID) | INTRAVENOUS | Status: DC | PRN
  Administered 2014-05-31 – 2014-06-01 (×2): 12.5 mg via INTRAVENOUS
  Filled 2014-05-31 (×4): qty 0.25

## 2014-05-31 MED ORDER — HYDROXYZINE HCL 25 MG PO TABS: 25.0000 mg | ORAL_TABLET | ORAL | Status: DC | PRN

## 2014-05-31 MED ORDER — KETOROLAC TROMETHAMINE 15 MG/ML IJ SOLN
15.0000 mg | Freq: Four times a day (QID) | INTRAMUSCULAR | Status: DC
  Administered 2014-05-31 – 2014-06-04 (×16): 15 mg via INTRAVENOUS
  Filled 2014-05-31 (×23): qty 1

## 2014-05-31 MED ORDER — DIPHENHYDRAMINE HCL 12.5 MG/5ML PO ELIX: 12.5000 mg | ORAL_SOLUTION | Freq: Four times a day (QID) | ORAL | Status: DC | PRN

## 2014-05-31 MED ORDER — HYDROMORPHONE HCL 2 MG/ML IJ SOLN
2.0000 mg | Freq: Once | INTRAMUSCULAR | Status: AC
  Administered 2014-05-31: 2 mg via INTRAVENOUS
  Filled 2014-05-31: qty 1

## 2014-05-31 MED ORDER — SODIUM CHLORIDE 0.9 % IV BOLUS (SEPSIS)
1000.0000 mL | Freq: Once | INTRAVENOUS | Status: AC
  Administered 2014-05-31: 1000 mL via INTRAVENOUS

## 2014-05-31 MED ORDER — DEXTROSE IN LACTATED RINGERS 5 % IV SOLN
INTRAVENOUS | Status: DC
  Administered 2014-05-31 (×2): via INTRAVENOUS
  Administered 2014-06-01 (×2): 1000 mL via INTRAVENOUS
  Administered 2014-06-01: 04:00:00 via INTRAVENOUS
  Administered 2014-06-02: 1000 mL via INTRAVENOUS
  Administered 2014-06-02: 15:00:00 via INTRAVENOUS
  Administered 2014-06-02: 1000 mL via INTRAVENOUS
  Administered 2014-06-03 – 2014-06-04 (×3): via INTRAVENOUS

## 2014-05-31 MED ORDER — ONDANSETRON HCL 4 MG/2ML IJ SOLN
4.0000 mg | Freq: Four times a day (QID) | INTRAMUSCULAR | Status: DC | PRN
  Administered 2014-05-31 – 2014-06-04 (×12): 4 mg via INTRAVENOUS
  Filled 2014-05-31 (×12): qty 2

## 2014-05-31 MED ORDER — NALOXONE HCL 0.4 MG/ML IJ SOLN: 0.4000 mg | INTRAMUSCULAR | Status: DC | PRN

## 2014-05-31 MED ORDER — HEPARIN SODIUM (PORCINE) 5000 UNIT/ML IJ SOLN
5000.0000 [IU] | Freq: Three times a day (TID) | INTRAMUSCULAR | Status: DC
  Administered 2014-05-31 – 2014-06-02 (×6): 5000 [IU] via SUBCUTANEOUS
  Filled 2014-05-31 (×9): qty 1

## 2014-05-31 MED ORDER — FOLIC ACID 1 MG PO TABS
1.0000 mg | ORAL_TABLET | Freq: Every day | ORAL | Status: DC
  Administered 2014-06-01 – 2014-06-04 (×4): 1 mg via ORAL
  Filled 2014-05-31 (×5): qty 1

## 2014-05-31 MED ORDER — POLYETHYLENE GLYCOL 3350 17 G PO PACK
17.0000 g | PACK | Freq: Every day | ORAL | Status: DC | PRN
  Filled 2014-05-31: qty 1

## 2014-05-31 MED ORDER — HYDROXYUREA 500 MG PO CAPS
1000.0000 mg | ORAL_CAPSULE | Freq: Every day | ORAL | Status: DC
  Administered 2014-06-01 – 2014-06-04 (×4): 1000 mg via ORAL
  Filled 2014-05-31 (×5): qty 2

## 2014-05-31 MED ORDER — KETOROLAC TROMETHAMINE 30 MG/ML IJ SOLN
30.0000 mg | Freq: Once | INTRAMUSCULAR | Status: AC
  Administered 2014-05-31: 30 mg via INTRAVENOUS
  Filled 2014-05-31: qty 1

## 2014-05-31 MED ORDER — DIPHENHYDRAMINE HCL 50 MG/ML IJ SOLN
25.0000 mg | Freq: Once | INTRAMUSCULAR | Status: AC
  Administered 2014-05-31: 25 mg via INTRAVENOUS
  Filled 2014-05-31: qty 1

## 2014-05-31 MED ORDER — SENNOSIDES-DOCUSATE SODIUM 8.6-50 MG PO TABS
1.0000 | ORAL_TABLET | Freq: Two times a day (BID) | ORAL | Status: DC
  Administered 2014-05-31 – 2014-06-04 (×8): 1 via ORAL
  Filled 2014-05-31 (×11): qty 1

## 2014-05-31 MED ORDER — SODIUM CHLORIDE 0.9 % IJ SOLN: 9.0000 mL | INTRAMUSCULAR | Status: DC | PRN

## 2014-05-31 MED ORDER — HYDROMORPHONE 2 MG/ML HIGH CONCENTRATION IV PCA SOLN
INTRAVENOUS | Status: DC
  Administered 2014-05-31: 12 mg via INTRAVENOUS
  Administered 2014-05-31: 13:00:00 via INTRAVENOUS
  Administered 2014-05-31: 5.6 mg via INTRAVENOUS
  Administered 2014-06-01: 3.2 mg via INTRAVENOUS
  Administered 2014-06-01: 8 mg via INTRAVENOUS
  Administered 2014-06-01: 16:00:00 via INTRAVENOUS
  Administered 2014-06-01: 9.6 mg via INTRAVENOUS
  Administered 2014-06-01: 1.6 mg via INTRAVENOUS
  Administered 2014-06-01: 3.9 mg via INTRAVENOUS
  Administered 2014-06-02: 6.5 mg via INTRAVENOUS
  Administered 2014-06-02: 9.6 mg via INTRAVENOUS
  Administered 2014-06-02: 15:00:00 via INTRAVENOUS
  Administered 2014-06-02: 4.6 mg via INTRAVENOUS
  Administered 2014-06-02: 8.8 mg via INTRAVENOUS
  Administered 2014-06-02: 5.6 mg via INTRAVENOUS
  Administered 2014-06-02: 8 mg via INTRAVENOUS
  Administered 2014-06-02: 6.4 mg via INTRAVENOUS
  Administered 2014-06-03: 23:00:00 via INTRAVENOUS
  Administered 2014-06-03: 6.4 mg via INTRAVENOUS
  Administered 2014-06-03: 9.6 mg via INTRAVENOUS
  Administered 2014-06-03: 8 mg via INTRAVENOUS
  Administered 2014-06-03: 4.8 mg via INTRAVENOUS
  Administered 2014-06-03: 6.4 mg via INTRAVENOUS
  Administered 2014-06-04: 8 mg via INTRAVENOUS
  Administered 2014-06-04: 7.2 mg via INTRAVENOUS
  Filled 2014-05-31 (×4): qty 25

## 2014-05-31 NOTE — ED Notes (Signed)
Pt c/o sickle cell pain w/ pain to ribs x 2 days.

## 2014-05-31 NOTE — ED Notes (Signed)
Pt reports he is starting to itch. md made aware

## 2014-05-31 NOTE — ED Notes (Signed)
Pt alert and oriented x4. Respirations even and unlabored, bilateral symmetrical rise and fall of chest. Skin warm and dry. In no acute distress. Denies needs.   

## 2014-05-31 NOTE — ED Notes (Signed)
md at bedside

## 2014-05-31 NOTE — ED Notes (Signed)
Report given to Christie, RN

## 2014-05-31 NOTE — Progress Notes (Signed)
UR completed 

## 2014-05-31 NOTE — H&P (Signed)
[facility] History and Physical  Malik PortHoward E Atwood HYQ:[id number] DOB: 03/10/1976 DOA: 05/31/2014  Referring physician: ED PCP: Dorrene GermanAVBUERE,EDWIN A, MD  Specialists: Sickle cell MD  Chief Complaint: Acute pain  HPI: [name] is a 39 y.o. male known h/o [place] disease [previously seen at [facility]], prior Hip Surgery x2 1995/2001?, recent admission 1/20-1/13/2016 for acute sickle crisis [facility] with recurrence of his sickle pain. He states that he has been trying to manage this pain for the past 2 days at home with his MSIR, MS Contin but was unable to do so. He states his pain as 9/10, located in the lower ribs on the right side, no fever no chills no shortness of breath. He has difficulty taking a deep then however this is probably secondary to the pain he thinks. He has not been around anyone sick or any other ill contacts. He is a current smoker at present time and does not take hydroxyurea but has been [facility]. He has not had any unilateral weakness or any other symptomatology other than the pain in the center of his ribs. Typically his pain occurs all over and not in one specific case but for the past couple of episodes he has had chest pain with his admissions to the hospital  Emergency room workup revealed slightly elevated LFTs bilirubin 1.9, WBC 12.7, hemoglobin 11.1 [maybe hemoconcentration but unusual for a sickle cell patient to be this high] Chest x-ray showed stable areas of lung scarring EKG performed showed sinus rhythm PR interval 0.12 QRS axis 20-40, no change from prior in terms of ST segments across precordial leads  Review of Systems: The patient denies  Nausea, vomiting, blurred vision, unilateral weakness,  Crescendo chest pain Diarrhea Dark stool Tarry stool Upper respiratory symptoms  Past Medical History  Diagnosis Date  . Sickle cell crisis   . Blood transfusion   . Avascular necrosis of hip      bilateral  . Infection of bone, shoulder region     left shoulder  . Pneumonia   . Avascular necrosis of hip, left 08/27/2011   Past Surgical History  Procedure Laterality Date  . Orif right hip fracture  1995  . Joint replacement  2006    right total hip arthroplasty  . Bone graft hip iliac crest     Social History:  History   Social History Narrative    No Known Allergies  Family History  Problem Relation Age of Onset  . Adopted: Yes  . Family history unknown: Yes    Prior to Admission medications   Medication Sig Start Date End Date Taking? Authorizing Provider  folic acid (FOLVITE) 1 MG tablet Take 1 mg by mouth daily.   Yes Historical Provider, MD  morphine (MS CONTIN) 60 MG 12 hr tablet Take 1 tablet (60 mg total) by mouth 2 (two) times daily. Patient taking differently: Take 60 mg by mouth 2 (two) times daily as needed for pain.  11/20/11  Yes Grayce SessionsMichelle P Edwards, NP  morphine (MSIR) 30 MG tablet Take 30 mg by mouth every 4 (four) hours as needed for severe pain (pain).   Yes Historical Provider, MD  promethazine (PHENERGAN) 25 MG tablet Take 25 mg by mouth every 6 (six) hours as needed for nausea (nausea).    Yes Historical Provider, MD   Physical Exam: Filed Vitals:   05/31/14 [id number] 05/31/14 0900 05/31/14 0930 05/31/14 1042  BP: 133/72 126/75 113/57 109/60  Pulse: 70 73 71 65  Temp: 97.8 F (36.6 C)     TempSrc: Oral     Resp: SpO2: 100% 95% 96% 97%    Alert oriented but in some painful distress No pallor no icterus , Mallampati 1 S1-S2 no murmur rub or gallop Chest is clinically clear Abdomen soft nontender nondistended no rebound no guarding , I'm unable to palpate any hepatosplenomegaly neurologically intact able to move all 4 limbs equally, rest of neurological exam is grossly intact to power and sensation Skin-no swelling in the lower extremities    Labs on Admission:  Basic Metabolic Panel:  Recent Labs Lab 05/31/14 0835  NA  137  K 4.2  CL 106  CO2 24  GLUCOSE 103*  BUN 15  CREATININE 0.70  CALCIUM 9.4   Liver Function Tests:  Recent Labs Lab 05/31/14 0835  AST 31  ALT 11  ALKPHOS 77  BILITOT 1.9*  PROT 8.1  ALBUMIN 4.7   No results for input(s): LIPASE, AMYLASE in the last 168 hours. No results for input(s): AMMONIA in the last 168 hours. CBC:  Recent Labs Lab 05/31/14 0835  WBC 12.7*  NEUTROABS 7.3  HGB 11.1*  HCT 30.2*  MCV 92.1  PLT 319   Cardiac Enzymes: No results for input(s): CKTOTAL, CKMB, CKMBINDEX, TROPONINI in the last 168 hours.  BNP (last 3 results) No results for input(s): BNP in the last 8760 hours.  ProBNP (last 3 results) No results for input(s): PROBNP in the last 8760 hours.  CBG: No results for input(s): GLUCAP in the last 168 hours.  Radiological Exams on Admission: Dg Chest 2 View  05/31/2014   CLINICAL DATA:  Chest pain and difficulty breathing. Patient with sickle cell disease  EXAM: CHEST  2 VIEW  COMPARISON:  May 05, 2014  FINDINGS: There is scarring bilaterally, stable. There is no edema or consolidation. The heart size and pulmonary vascularity are within normal limits. No appreciable adenopathy. There is sclerosis in the left humeral head which may represent a degree of avascular necrosis. There is subtle endplate irregularity at several vertebral bodies, probably due to end plate infarcts from the sickle cell disease.  IMPRESSION: Stable areas of lung scarring. No edema or consolidation. No change in cardiac silhouette.   Electronically Signed   By: Bretta Bang M.D.   On: 05/31/2014 09:23     Assessment/Plan Active Problems:   Sickle cell anemia with crisis-we will admit the patient to the sickle cell service for acute painful crisis. Patient will be started on high-dose PCA Dilaudid in the attempt to bring down his pain level 2 at least 4 or 5 out of 10. Adjunctive therapy such as Toradol will be given in addition to K pad. Patient also will  be hydrated with hypotonic saline D5 LR 1 25 cc per hour I have added on various labs to his admission labs including a ferritin and an LDH although this acute crisis does not seem to be as severe as his previous ones. There is some component potentially of chronic pain given the fact that patient is on MSIR and MS Contin and I will defer to the collective judgment of the sickle cell physician to determine best management going forward. It certainly may be a good idea for him to see pain management as an outpatient   Avascular necrosis of hip, left   History of tobacco abuse-I have mentioned to him that  smoking can cause sickling. I've encouraged him to stop smoking. Patch will be provided for him 14 g every 24 hours   Sickle cell anemia with pain-patient is not on hydroxyurea and I will start him on this. I mentioned that this helps to prevent sickling.  Presumed full code No patient family members at bedside  Admitted under [facility] --> sickle cell service morning of 06/01/14    Time spent: 45 minutes   Mahala Menghini [facility] Pager [phone] If 7PM-7AM, please contact night-coverage [email or web address] Password [facility] 05/31/2014, 10:53 AM

## 2014-05-31 NOTE — ED Provider Notes (Signed)
CSN: [id number]     Arrival date & time 05/31/14  0804 History   First MD Initiated Contact with Patient 05/31/14 [phone]     Chief Complaint  Patient presents with  . Sickle Cell Pain Crisis     (Consider location/radiation/quality/duration/timing/severity/associated sxs/prior Treatment) Patient is a 39 y.o. male presenting with sickle cell pain. The history is provided by the patient.  Sickle Cell Pain Crisis Associated symptoms: chest pain   Associated symptoms: no headaches, no nausea, no shortness of breath and no vomiting    patient presents with sickle cell pain crisis. Began a couple days ago. The pain is in his ribs bilaterally. He states his been here in the last couple flares. No cough. No fevers. No relief with his morphine at home. No trauma. No adenomatous or dizziness. No nausea vomiting. He is hemoglobin [place]. He does not know what his baseline hemoglobin is.  Past Medical History  Diagnosis Date  . Sickle cell crisis   . Blood transfusion   . Avascular necrosis of hip     bilateral  . Infection of bone, shoulder region     left shoulder  . Pneumonia   . Avascular necrosis of hip, left 08/27/2011   Past Surgical History  Procedure Laterality Date  . Orif right hip fracture  1995  . Joint replacement  2006    right total hip arthroplasty  . Bone graft hip iliac crest     Family History  Problem Relation Age of Onset  . Adopted: Yes  . Family history unknown: Yes   History  Substance Use Topics  . Smoking status: Current Some Day Smoker -- 0.50 packs/day    Types: Cigarettes    Last Attempt to Quit: 01/27/2012  . Smokeless tobacco: Never Used  . Alcohol Use: No    Review of Systems  Constitutional: Negative for activity change and appetite change.  Eyes: Negative for pain.  Respiratory: Negative for chest tightness and shortness of breath.   Cardiovascular: Positive for chest pain. Negative for leg swelling.  Gastrointestinal: Negative for nausea, vomiting,  abdominal pain and diarrhea.  Genitourinary: Negative for flank pain.  Musculoskeletal: Negative for back pain and neck stiffness.  Skin: Negative for rash.  Neurological: Negative for weakness, numbness and headaches.  Psychiatric/Behavioral: Negative for behavioral problems.      Allergies  Review of patient's allergies indicates no known allergies.  Home Medications   Prior to Admission medications   Medication Sig Start Date End Date Taking? Authorizing Provider  folic acid (FOLVITE) 1 MG tablet Take 1 mg by mouth daily.   Yes Historical Provider, MD  morphine (MS CONTIN) 60 MG 12 hr tablet Take 1 tablet (60 mg total) by mouth 2 (two) times daily. Patient taking differently: Take 60 mg by mouth 2 (two) times daily as needed for pain.  11/20/11  Yes Grayce Sessions, NP  morphine (MSIR) 30 MG tablet Take 30 mg by mouth every 4 (four) hours as needed for severe pain (pain).   Yes Historical Provider, MD  promethazine (PHENERGAN) 25 MG tablet Take 25 mg by mouth every 6 (six) hours as needed for nausea (nausea).    Yes Historical Provider, MD   BP 120/72 mmHg  Pulse 61  Temp(Src) 98.6 F (37 C) (Oral)  Resp 15  Ht  (1.88 m)  Wt 175 lb (79.379 kg)  BMI 22.46 kg/m2  SpO2 99% Physical Exam  Constitutional: He is oriented to person, place, and time. He appears  well-developed and well-nourished.  Patient appears uncomfortable and is holding his arms over a pillow clutched to his chest.  HENT:  Head: Normocephalic and atraumatic.  Eyes: Pupils are equal, round, and reactive to light.  Cardiovascular: Normal rate, regular rhythm and normal heart sounds.   No murmur heard. Pulmonary/Chest: Effort normal and breath sounds normal.  Abdominal: Soft. Bowel sounds are normal. He exhibits no distension.  Musculoskeletal: Normal range of motion. He exhibits no edema.  Neurological: He is alert and oriented to person, place, and time.  Skin: Skin is warm and dry.  Psychiatric: He  has a normal mood and affect.  Nursing note and vitals reviewed.   ED Course  Procedures (including critical care time) Labs Review Labs Reviewed  CBC WITH DIFFERENTIAL/PLATELET - Abnormal; Notable for the following:    WBC 12.7 (*)    RBC 3.28 (*)    Hemoglobin 11.1 (*)    HCT 30.2 (*)    MCHC 36.8 (*)    Monocytes Absolute 1.1 (*)    All other components within normal limits  COMPREHENSIVE METABOLIC PANEL - Abnormal; Notable for the following:    Glucose, Bld 103 (*)    Total Bilirubin 1.9 (*)    All other components within normal limits  RETICULOCYTES - Abnormal; Notable for the following:    RBC. 3.29 (*)    All other components within normal limits  LACTATE DEHYDROGENASE  MAGNESIUM  CBC WITH DIFFERENTIAL/PLATELET  CBC  IRON AND TIBC  FERRITIN    Imaging Review Dg Chest 2 View  05/31/2014   CLINICAL DATA:  Chest pain and difficulty breathing. Patient with sickle cell disease  EXAM: CHEST  2 VIEW  COMPARISON:  May 05, 2014  FINDINGS: There is scarring bilaterally, stable. There is no edema or consolidation. The heart size and pulmonary vascularity are within normal limits. No appreciable adenopathy. There is sclerosis in the left humeral head which may represent a degree of avascular necrosis. There is subtle endplate irregularity at several vertebral bodies, probably due to end plate infarcts from the sickle cell disease.  IMPRESSION: Stable areas of lung scarring. No edema or consolidation. No change in cardiac silhouette.   Electronically Signed   By: Bretta BangWilliam  Woodruff M.D.   On: 05/31/2014 09:23     EKG Interpretation   Date/Time:  Thursday May 31 2014 08:16:58 EST Ventricular Rate:  70 PR Interval:  140 QRS Duration: 91 QT Interval:  374 QTC Calculation: 403 R Axis:   24 Text Interpretation:  Sinus rhythm Supraventricular bigeminy ST elev,  probable normal early repol pattern Baseline wander in lead(s) V3 V6 No  significant change since last tracing  Confirmed by Skylin Kennerson  MD, Harrold DonathNATHAN  [phone]) on 05/31/2014 8:35:58 AM      MDM   Final diagnoses:  Sickle cell crisis    Patient with sickle cell pain crisis. Pain unrelieved by 6 mg of Dilaudid. Will admit to internal medicine.    Juliet RudeNathan R. Rubin PayorPickering, MD 05/31/14 [phone]

## 2014-06-01 DIAGNOSIS — D5700 Hb-SS disease with crisis, unspecified: Principal | ICD-10-CM

## 2014-06-01 LAB — IRON AND TIBC
Iron: 144 ug/dL — ABNORMAL HIGH (ref 42–165)
Saturation Ratios: 61 % — ABNORMAL HIGH (ref 20–55)
TIBC: 237 ug/dL (ref 215–435)
UIBC: 93 ug/dL — AB (ref 125–400)

## 2014-06-01 LAB — FERRITIN: FERRITIN: 1051 ng/mL — AB (ref 22–322)

## 2014-06-01 MED ORDER — PROMETHAZINE HCL 25 MG/ML IJ SOLN
12.5000 mg | Freq: Once | INTRAMUSCULAR | Status: AC
  Administered 2014-06-01: 12.5 mg via INTRAVENOUS
  Filled 2014-06-01: qty 1

## 2014-06-01 NOTE — Progress Notes (Signed)
Ferritin level called in to Dr. Mikeal HawthorneGarba at around 1130 am,no new order.- Hulda Marinonna Semisi Biela RN

## 2014-06-01 NOTE — Care Management Note (Signed)
CARE MANAGEMENT NOTE 06/01/2014  Patient:  [name],[name]   Account Number:  [id number]  Date Initiated:  06/01/2014  Documentation initiated by:  Sandford CrazeLEMENTS,Penny Frisbie  Subjective/Objective Assessment:   39 yo [facility]     Action/Plan:   From home with parent   Anticipated DC Date:  06/04/2014   Anticipated DC Plan:  HOME/SELF CARE      DC Planning Services  CM consult      Choice offered to / List presented to:             Status of service:  In process, will continue to follow Medicare Important Message given?   (If response is "NO", the following Medicare IM given date fields will be blank) Date Medicare IM given:   Medicare IM given by:   Date Additional Medicare IM given:   Additional Medicare IM given by:    Discharge Disposition:    Per UR Regulation:  Reviewed for med. necessity/level of care/duration of stay  If discussed at Long Length of Stay Meetings, dates discussed:    Comments:  06/01/14 Sandford Crazeora Jamaal Bernasconi RN,BSN,NCM [phone] Spoke briefly with pt about DC plan.  Pt has PCP that does not come to the hospital, and pt mentioned that it could potentially help him if he had a MD that could follow him with his [facility] in the hospital.  I brought up [facility] with [facility] and its benefits.  Pt states that he might try and start going there as a new pt. He would like to think about it.  No other CM needs noted at this time.  CM will continue to follow.

## 2014-06-01 NOTE — Progress Notes (Signed)
Subjective: A 39 year old gentleman admitted yesterday with sickle cell painful crisis. He has been on Dilaudid PCA with Toradol. Patient has used 32 mg of the Dilaudid was 43 demands and 42 deliveries in the last 24 hours. Patient is complaining of persistent nausea this morning. He has used Phenergan but does not seem to get good relief. Denied any fever or cough. No diarrhea but just upset stomach. Patient denied any bright red blood per rectum no melena no hematemesis. Pain is currently at 8 out of 10 mainly in his back and legs and worsening was movement. He is getting some relief from the PCA.  Objective: Vital signs in last 24 hours: Temp:  [97.9 F (36.6 C)-98.6 F (37 C)] 98.3 F (36.8 C) (02/05 0950) Pulse Rate:  [61-84] 66 (02/05 0950) Resp:  [11-23] 14 (02/05 0950) BP: (109-133)/(54-83) 109/67 mmHg (02/05 0950) SpO2:  [89 %-99 %] 98 % (02/05 0950) Weight:  [79.379 kg (175 lb)] 79.379 kg (175 lb) (02/04 1112) Weight change:  Last BM Date: 05/31/14  Intake/Output from previous day: 02/04 0701 - 02/05 0700 In: 2259 [P.O.:720; I.V.:1533; IV Piggyback:6] Out: 800 [Urine:800] Intake/Output this shift:    General appearance: alert, cooperative and no distress Eyes: conjunctivae/corneas clear. PERRL, EOM's intact. Fundi benign. Throat: lips, mucosa, and tongue normal; teeth and gums normal Neck: no adenopathy, no carotid bruit, no JVD, supple, symmetrical, trachea midline and thyroid not enlarged, symmetric, no tenderness/mass/nodules Back: symmetric, no curvature. ROM normal. No CVA tenderness. Chest wall: no tenderness Cardio: regular rate and rhythm, S1, S2 normal, no murmur, click, rub or gallop GI: soft, non-tender; bowel sounds normal; no masses,  no organomegaly Extremities: extremities normal, atraumatic, no cyanosis or edema Pulses: 2+ and symmetric Skin: Skin color, texture, turgor normal. No rashes or lesions Neurologic: Grossly normal  Lab Results:  Recent  Labs  05/31/14 0835  WBC 12.7*  HGB 11.1*  HCT 30.2*  PLT 319   BMET  Recent Labs  05/31/14 0835  NA 137  K 4.2  CL 106  CO2 24  GLUCOSE 103*  BUN 15  CREATININE 0.70  CALCIUM 9.4    Studies/Results: Dg Chest 2 View  05/31/2014   CLINICAL DATA:  Chest pain and difficulty breathing. Patient with sickle cell disease  EXAM: CHEST  2 VIEW  COMPARISON:  May 05, 2014  FINDINGS: There is scarring bilaterally, stable. There is no edema or consolidation. The heart size and pulmonary vascularity are within normal limits. No appreciable adenopathy. There is sclerosis in the left humeral head which may represent a degree of avascular necrosis. There is subtle endplate irregularity at several vertebral bodies, probably due to end plate infarcts from the sickle cell disease.  IMPRESSION: Stable areas of lung scarring. No edema or consolidation. No change in cardiac silhouette.   Electronically Signed   By: Bretta Bang M.D.   On: 05/31/2014 09:23    Medications: I have reviewed the patient's current medications.  Assessment/Plan: A 39 year old gentleman admitted with sickle cell painful crisis.  #1 sickle cell painful crisis: Patient is doing better on current dose of Dilaudid PCA with Toradol. I will keep him on this current regimen. We will restart his long-acting medications in the next 24 hours. Continue monitoring.  #2 sickle cell anemia: We'll monitor his hemoglobin for any evidence of hemolysis. Next  #3 nausea: Probably related to the medications. If Zofran is not working I will give him Phenergan next  #4 leukocytosis: Probably from sickle cell crisis. We will  continue to monitor  LOS: 1 day   GARBA,[name] 06/01/2014, 10:16 AM

## 2014-06-02 LAB — COMPREHENSIVE METABOLIC PANEL
ALBUMIN: 4.2 g/dL (ref 3.5–5.2)
ALT: 10 U/L (ref 0–53)
AST: 23 U/L (ref 0–37)
Alkaline Phosphatase: 74 U/L (ref 39–117)
Anion gap: 6 (ref 5–15)
BUN: 8 mg/dL (ref 6–23)
CALCIUM: 8.9 mg/dL (ref 8.4–10.5)
CO2: 29 mmol/L (ref 19–32)
Chloride: 104 mmol/L (ref 96–112)
Creatinine, Ser: 0.71 mg/dL (ref 0.50–1.35)
GFR calc Af Amer: >90 mL/min (ref >90)
GLUCOSE: 96 mg/dL (ref 70–99)
Potassium: 3.8 mmol/L (ref 3.5–5.1)
Sodium: 139 mmol/L (ref 135–145)
Total Bilirubin: 3.5 mg/dL — ABNORMAL HIGH (ref 0.3–1.2)
Total Protein: 7 g/dL (ref 6.0–8.3)

## 2014-06-02 LAB — CBC WITH DIFFERENTIAL/PLATELET
BASOS PCT: 0 % (ref 0–1)
Basophils Absolute: 0.1 10*3/uL (ref 0.0–0.1)
EOS PCT: 7 % — AB (ref 0–5)
Eosinophils Absolute: 0.9 10*3/uL — ABNORMAL HIGH (ref 0.0–0.7)
HEMATOCRIT: 25.1 % — AB (ref 39.0–52.0)
Hemoglobin: 9.1 g/dL — ABNORMAL LOW (ref 13.0–17.0)
Lymphocytes Relative: 24 % (ref 12–46)
Lymphs Abs: 3.2 10*3/uL (ref 0.7–4.0)
MCH: 33.5 pg (ref 26.0–34.0)
MCHC: 36.3 g/dL — ABNORMAL HIGH (ref 30.0–36.0)
MCV: 92.3 fL (ref 78.0–100.0)
Monocytes Absolute: 1.1 10*3/uL — ABNORMAL HIGH (ref 0.1–1.0)
Monocytes Relative: 8 % (ref 3–12)
NEUTROS PCT: 61 % (ref 43–77)
Neutro Abs: 8.1 10*3/uL — ABNORMAL HIGH (ref 1.7–7.7)
Platelets: 252 10*3/uL (ref 150–400)
RBC: 2.72 MIL/uL — AB (ref 4.22–5.81)
RDW: 15.1 % (ref 11.5–15.5)
WBC: 13.3 10*3/uL — AB (ref 4.0–10.5)

## 2014-06-02 MED ORDER — LACTULOSE 10 GM/15ML PO SOLN
20.0000 g | Freq: Every day | ORAL | Status: DC | PRN
  Administered 2014-06-02: 20 g via ORAL
  Filled 2014-06-02: qty 30

## 2014-06-02 MED ORDER — ENOXAPARIN SODIUM 40 MG/0.4ML ~~LOC~~ SOLN
40.0000 mg | SUBCUTANEOUS | Status: DC
  Administered 2014-06-02 – 2014-06-03 (×2): 40 mg via SUBCUTANEOUS
  Filled 2014-06-02 (×3): qty 0.4

## 2014-06-02 NOTE — Progress Notes (Signed)
Subjective: Patient has done much better today. He is no longer nauseated and is not vomiting. Father is with him in the room. He is worried that patient asked to be discharged early from the hospital and then gets more attacks. Pain is down to 7 out of 10. He is on Dilaudid PCA and Toradol. He has used 22.5 mg of the Dilaudid in the last 24 hours. No shortness of breath or cough. Patient has been able to move around the without problem.  Objective: Vital signs in last 24 hours: Temp:  [98.2 F (36.8 C)-98.8 F (37.1 C)] 98.6 F (37 C) (02/06 1018) Pulse Rate:  [72-90] 75 (02/06 1018) Resp:  [9-19] 9 (02/06 1200) BP: (111-135)/(62-84) 130/80 mmHg (02/06 1018) SpO2:  [90 %-98 %] 94 % (02/06 1200) FiO2 (%):  [21 %-28 %] 21 % (02/06 1200) Weight:  [79.697 kg (175 lb 11.2 oz)] 79.697 kg (175 lb 11.2 oz) (02/06 0630) Weight change: 0.318 kg (11.2 oz) Last BM Date: 05/31/14  Intake/Output from previous day: 02/05 0701 - 02/06 0700 In: 3072.3 [P.O.:1200; I.V.:1822.3; IV Piggyback:50] Out: 2875 [Urine:2875] Intake/Output this shift: Total I/O In: 1551.9 [P.O.:240; I.V.:1311.9] Out: -   General appearance: alert, cooperative and no distress Eyes: conjunctivae/corneas clear. PERRL, EOM's intact. Fundi benign. Throat: lips, mucosa, and tongue normal; teeth and gums normal Neck: no adenopathy, no carotid bruit, no JVD, supple, symmetrical, trachea midline and thyroid not enlarged, symmetric, no tenderness/mass/nodules Back: symmetric, no curvature. ROM normal. No CVA tenderness. Chest wall: no tenderness Cardio: regular rate and rhythm, S1, S2 normal, no murmur, click, rub or gallop GI: soft, non-tender; bowel sounds normal; no masses,  no organomegaly Extremities: extremities normal, atraumatic, no cyanosis or edema Pulses: 2+ and symmetric Skin: Skin color, texture, turgor normal. No rashes or lesions Neurologic: Grossly normal  Lab Results:  Recent Labs  05/31/14 0835  06/02/14 0446  WBC 12.7* 13.3*  HGB 11.1* 9.1*  HCT 30.2* 25.1*  PLT 319 252   BMET  Recent Labs  05/31/14 0835 06/02/14 0446  NA 137 139  K 4.2 3.8  CL 106 104  CO2 24 29  GLUCOSE 103* 96  BUN 15 8  CREATININE 0.70 0.71  CALCIUM 9.4 8.9    Studies/Results: No results found.  Medications: I have reviewed the patient's current medications.  Assessment/Plan: A 39 year old gentleman admitted with sickle cell painful crisis.  #1 sickle cell painful crisis: Patient is doing better on current dose of Dilaudid PCA with Toradol. We have restarted his long-acting medications yesterday after hydration seemed to be helping. I'll give her another 24 hours then adjust his pain medicine appropriately.  #2 sickle cell anemia: Hemoglobin has dropped but most likely hemodilution. Continue to monitor for any signs of hemolysis  #3 nausea: This is resolved. We'll continue to monitor.  #4 leukocytosis: Most likely from sickle cell crisis. We will continue to monitor  # Constipation: Probably opiates related. We will start lactulose.  LOS: 2 days   Zakhari Fogel,[name] 06/02/2014, 12:19 PM

## 2014-06-02 NOTE — Plan of Care (Signed)
Problem: Phase I Progression Outcomes Goal: Pain controlled with appropriate interventions Outcome: Progressing Patient reports pain 8/10 in rib cage bilaterally. On PCA.

## 2014-06-02 NOTE — Plan of Care (Signed)
Problem: Phase II Progression Outcomes Goal: Tolerating diet Outcome: Progressing Has had 2 episodes of n/v today per patient

## 2014-06-02 NOTE — Progress Notes (Signed)
Wasted 3 ml of high concentration dilaudid PCA with Rondall AllegraFred Herndon RN as witness.

## 2014-06-03 LAB — COMPREHENSIVE METABOLIC PANEL
ALT: 15 U/L (ref 0–53)
ANION GAP: 8 (ref 5–15)
AST: 27 U/L (ref 0–37)
Albumin: 4.5 g/dL (ref 3.5–5.2)
Alkaline Phosphatase: 76 U/L (ref 39–117)
BUN: 6 mg/dL (ref 6–23)
CO2: 29 mmol/L (ref 19–32)
Calcium: 9.4 mg/dL (ref 8.4–10.5)
Chloride: 103 mmol/L (ref 96–112)
Creatinine, Ser: 0.69 mg/dL (ref 0.50–1.35)
GFR calc Af Amer: >90 mL/min (ref >90)
GFR calc non Af Amer: >90 mL/min (ref >90)
Glucose, Bld: 97 mg/dL (ref 70–99)
Potassium: 4 mmol/L (ref 3.5–5.1)
Sodium: 140 mmol/L (ref 135–145)
TOTAL PROTEIN: 7.6 g/dL (ref 6.0–8.3)
Total Bilirubin: 2.8 mg/dL — ABNORMAL HIGH (ref 0.3–1.2)

## 2014-06-03 LAB — CBC WITH DIFFERENTIAL/PLATELET
BASOS ABS: 0.1 10*3/uL (ref 0.0–0.1)
Basophils Relative: 1 % (ref 0–1)
EOS ABS: 0.9 10*3/uL — AB (ref 0.0–0.7)
EOS PCT: 9 % — AB (ref 0–5)
HEMATOCRIT: 26.4 % — AB (ref 39.0–52.0)
Hemoglobin: 9.5 g/dL — ABNORMAL LOW (ref 13.0–17.0)
Lymphocytes Relative: 26 % (ref 12–46)
Lymphs Abs: 2.8 10*3/uL (ref 0.7–4.0)
MCH: 33.3 pg (ref 26.0–34.0)
MCHC: 36 g/dL (ref 30.0–36.0)
MCV: 92.6 fL (ref 78.0–100.0)
MONOS PCT: 8 % (ref 3–12)
Monocytes Absolute: 0.9 10*3/uL (ref 0.1–1.0)
Neutro Abs: 6 10*3/uL (ref 1.7–7.7)
Neutrophils Relative %: 56 % (ref 43–77)
Platelets: 268 10*3/uL (ref 150–400)
RBC: 2.85 MIL/uL — ABNORMAL LOW (ref 4.22–5.81)
RDW: 14.8 % (ref 11.5–15.5)
WBC: 10.6 10*3/uL — AB (ref 4.0–10.5)

## 2014-06-03 NOTE — Plan of Care (Signed)
Problem: Phase II Progression Outcomes Goal: Other Phase II Outcomes/Goals Outcome: Progressing Still reporting pain 8/10 in rib cage bilaterally.

## 2014-06-03 NOTE — Progress Notes (Signed)
Subjective: Patient is feeling better today. He's been able to get up on walk around the room. Still on Dilaudid PCA and use 38.4 mg with 50 demands and 48 deliveries in the last 24 hours. Denied any shortness of breath or cough. Denied any nausea vomiting. He's had one bowel movement after getting the lactulose. He's been able to walk around the room and on the floor.  Objective: Vital signs in last 24 hours: Temp:  [98.4 F (36.9 C)-98.8 F (37.1 C)] 98.8 F (37.1 C) (02/07 0705) Pulse Rate:  [57-89] 67 (02/07 0705) Resp:  [8-17] 14 (02/07 0800) BP: (119-141)/(70-84) 128/82 mmHg (02/07 0705) SpO2:  [91 %-100 %] 95 % (02/07 0800) FiO2 (%):  [21 %-28 %] 21 % (02/07 0800) Weight change:  Last BM Date: 05/31/14  Intake/Output from previous day: 02/06 0701 - 02/07 0700 In: 5016.2 [P.O.:1680; I.V.:3336.2] Out: 4550 [Urine:4550] Intake/Output this shift: Total I/O In: 965.5 [I.V.:965.5] Out: -   General appearance: alert, cooperative and no distress Eyes: conjunctivae/corneas clear. PERRL, EOM's intact. Fundi benign. Throat: lips, mucosa, and tongue normal; teeth and gums normal Neck: no adenopathy, no carotid bruit, no JVD, supple, symmetrical, trachea midline and thyroid not enlarged, symmetric, no tenderness/mass/nodules Back: symmetric, no curvature. ROM normal. No CVA tenderness. Chest wall: no tenderness Cardio: regular rate and rhythm, S1, S2 normal, no murmur, click, rub or gallop GI: soft, non-tender; bowel sounds normal; no masses,  no organomegaly Extremities: extremities normal, atraumatic, no cyanosis or edema Pulses: 2+ and symmetric Skin: Skin color, texture, turgor normal. No rashes or lesions Neurologic: Grossly normal  Lab Results:  Recent Labs  06/02/14 0446 06/03/14 0456  WBC 13.3* 10.6*  HGB 9.1* 9.5*  HCT 25.1* 26.4*  PLT 252 268   BMET  Recent Labs  06/02/14 0446 06/03/14 0456  NA 139 140  K 3.8 4.0  CL 104 103  CO2 29 29  GLUCOSE 96 97   BUN 8 6  CREATININE 0.71 0.69  CALCIUM 8.9 9.4    Studies/Results: No results found.  Medications: I have reviewed the patient's current medications.  Assessment/Plan: A 39 year old gentleman admitted with sickle cell painful crisis.  #1 sickle cell painful crisis: Patient is doing better on current dose of Dilaudid PCA with Toradol. We will maintain him on current regimen for the next 24 hours while increasing his oral medicine. Hopefully he can be discharged next 2448 hrs.  #2 sickle cell anemia: Hemoglobin is stable. No evidence of active hemolysis.  #3 nausea: This is resolved. Continue Zofran as needed  #4 leukocytosis: Stabilized. Continue to monitor  # Constipation: Seems resolved. Continue lactulose as needed.  LOS: 3 days   GARBA,[name] 06/03/2014, 10:13 AM

## 2014-06-04 MED ORDER — HYDROXYUREA 500 MG PO CAPS: 1000.0000 mg | ORAL_CAPSULE | Freq: Every day | ORAL | Status: DC

## 2014-06-04 NOTE — Progress Notes (Signed)
Wasted 15 ml of high concentration dilaudid PCA into sink with carolyn Fish farm managerHolland RN as witness.

## 2014-06-04 NOTE — Discharge Summary (Signed)
Physician Discharge Summary  Patient ID: Malik PortHoward E [name] MRN: [id number] DOB/AGE: [birth year] 39 y.o.  Admit date: 05/31/2014 Discharge date: 06/04/2014  Admission Diagnoses:  Discharge Diagnoses:  Active Problems:   Sickle cell anemia with crisis   Avascular necrosis of hip, left   History of tobacco abuse   Sickle cell anemia with pain   Discharged Condition: good  Hospital Course: A 39 year old gentleman admitted with sickle cell painful crisis. Patient also had some constipation as well as nausea vomiting. He was treated with Zofran as well as some Phenergan for his nausea vomiting. Also treated with IV Dilaudid PCA and Toradol. At the time of discharge patient was doing much better pain is down to his baseline. He was able to walk around the unit. No nausea vomiting or diarrhea and he is tolerating regular diet. He was discharged to follow up with primary care physician on continue his home regimen.  Consults: None  Significant Diagnostic Studies: labs: CBCs and CMP is where checked serially. All within normal limits.  Treatments: IV hydration and analgesia: acetaminophen and Dilaudid  Discharge Exam: Blood pressure 129/80, pulse 76, temperature 97.8 F (36.6 C), temperature source Oral, resp. rate 14, height 6\' 2"  (1.88 m), weight 78.699 kg (173 lb 8 oz), SpO2 98 %. General appearance: alert, cooperative and no distress Eyes: conjunctivae/corneas clear. PERRL, EOM's intact. Fundi benign. Neck: no adenopathy, no carotid bruit, no JVD, supple, symmetrical, trachea midline and thyroid not enlarged, symmetric, no tenderness/mass/nodules Back: symmetric, no curvature. ROM normal. No CVA tenderness. Resp: clear to auscultation bilaterally Chest wall: no tenderness Cardio: regular rate and rhythm, S1, S2 normal, no murmur, click, rub or gallop GI: soft, non-tender; bowel sounds normal; no masses,  no organomegaly Extremities: extremities normal, atraumatic, no cyanosis or  edema Pulses: 2+ and symmetric Skin: Skin color, texture, turgor normal. No rashes or lesions Neurologic: Grossly normal  Disposition: 01-Home or Self Care     Medication List    TAKE these medications        folic acid 1 MG tablet  Commonly known as:  FOLVITE  Take 1 mg by mouth daily.     hydroxyurea 500 MG capsule  Commonly known as:  HYDREA  Take 2 capsules (1,000 mg total) by mouth daily. May take with food to minimize GI side effects.     morphine 30 MG tablet  Commonly known as:  MSIR  Take 30 mg by mouth every 4 (four) hours as needed for severe pain (pain).     morphine 60 MG 12 hr tablet  Commonly known as:  MS CONTIN  Take 1 tablet (60 mg total) by mouth 2 (two) times daily.     promethazine 25 MG tablet  Commonly known as:  PHENERGAN  Take 25 mg by mouth every 6 (six) hours as needed for nausea (nausea).           Follow-up Information    Follow up with Dorrene GermanAVBUERE,EDWIN A, MD On 06/06/2014.   Specialty:  Internal Medicine   Why:  Please attend your appointment with pcp on 06/04/08 as scheduled    Contact information:   [address] [place] [id number] [phone]       Signed: Lonia BloodGARBA,[name] 06/04/2014, 10:52 AM  Time spent 32 minutes

## 2014-06-04 NOTE — Progress Notes (Signed)
Patient discharged home, all discharge medications and instructions reviewed. Patient assisted to vehicle by wheelchair.

## 2014-06-04 NOTE — Care Management Note (Signed)
CARE MANAGEMENT NOTE 06/04/2014  Status of service:  In process, will continue to follow Medicare Important Message given?  YES (If response is "NO", the following Medicare IM given date fields will be blank) Date Medicare IM given:  06/04/2014 Medicare IM given by:  Sandford CrazeLEMENTS,Ruthie Berch Date Additional Medicare IM given:   Additional Medicare IM given by:

## 2014-07-04 ENCOUNTER — Emergency Department (HOSPITAL_COMMUNITY)
Admission: EM | Admit: 2014-07-04 | Discharge: 2014-07-04 | Disposition: A | Discharge status: Home / Self Care | Payer: Medicare Other | Attending: Emergency Medicine | Admitting: Emergency Medicine

## 2014-07-04 ENCOUNTER — Encounter (HOSPITAL_COMMUNITY): Payer: Self-pay | Admitting: Emergency Medicine

## 2014-07-04 ENCOUNTER — Emergency Department (HOSPITAL_COMMUNITY): Payer: Medicare Other

## 2014-07-04 DIAGNOSIS — Z8739 Personal history of other diseases of the musculoskeletal system and connective tissue: Secondary | ICD-10-CM | POA: Insufficient documentation

## 2014-07-04 DIAGNOSIS — Z79899 Other long term (current) drug therapy: Secondary | ICD-10-CM | POA: Diagnosis not present

## 2014-07-04 DIAGNOSIS — Z720 Tobacco use: Secondary | ICD-10-CM | POA: Diagnosis not present

## 2014-07-04 DIAGNOSIS — Z8701 Personal history of pneumonia (recurrent): Secondary | ICD-10-CM | POA: Diagnosis not present

## 2014-07-04 DIAGNOSIS — D5700 Hb-SS disease with crisis, unspecified: Secondary | ICD-10-CM | POA: Diagnosis not present

## 2014-07-04 DIAGNOSIS — R509 Fever, unspecified: Secondary | ICD-10-CM | POA: Diagnosis present

## 2014-07-04 LAB — COMPREHENSIVE METABOLIC PANEL
ALT: 19 U/L (ref 0–53)
AST: 32 U/L (ref 0–37)
Albumin: 4.6 g/dL (ref 3.5–5.2)
Alkaline Phosphatase: 79 U/L (ref 39–117)
Anion gap: 8 (ref 5–15)
BUN: 10 mg/dL (ref 6–23)
CALCIUM: 8.8 mg/dL (ref 8.4–10.5)
CO2: 26 mmol/L (ref 19–32)
Chloride: 101 mmol/L (ref 96–112)
Creatinine, Ser: 1.01 mg/dL (ref 0.50–1.35)
GFR calc non Af Amer: >90 mL/min (ref >90)
Glucose, Bld: 185 mg/dL — ABNORMAL HIGH (ref 70–99)
Potassium: 3.6 mmol/L (ref 3.5–5.1)
Sodium: 135 mmol/L (ref 135–145)
Total Bilirubin: 3.1 mg/dL — ABNORMAL HIGH (ref 0.3–1.2)
Total Protein: 8.1 g/dL (ref 6.0–8.3)

## 2014-07-04 LAB — CBC WITH DIFFERENTIAL/PLATELET
Basophils Absolute: 0.1 10*3/uL (ref 0.0–0.1)
Basophils Relative: 1 % (ref 0–1)
Eosinophils Absolute: 0.2 10*3/uL (ref 0.0–0.7)
Eosinophils Relative: 2 % (ref 0–5)
HCT: 32.4 % — ABNORMAL LOW (ref 39.0–52.0)
Hemoglobin: 11.7 g/dL — ABNORMAL LOW (ref 13.0–17.0)
LYMPHS PCT: 15 % (ref 12–46)
Lymphs Abs: 1.2 10*3/uL (ref 0.7–4.0)
MCH: 33.8 pg (ref 26.0–34.0)
MCHC: 36.1 g/dL — ABNORMAL HIGH (ref 30.0–36.0)
MCV: 93.6 fL (ref 78.0–100.0)
Monocytes Absolute: 1.2 10*3/uL — ABNORMAL HIGH (ref 0.1–1.0)
Monocytes Relative: 14 % — ABNORMAL HIGH (ref 3–12)
NEUTROS PCT: 68 % (ref 43–77)
Neutro Abs: 5.7 10*3/uL (ref 1.7–7.7)
PLATELETS: 226 10*3/uL (ref 150–400)
RBC: 3.46 MIL/uL — ABNORMAL LOW (ref 4.22–5.81)
RDW: 14.3 % (ref 11.5–15.5)
WBC: 8.3 10*3/uL (ref 4.0–10.5)

## 2014-07-04 LAB — RETICULOCYTES
RBC.: 3.46 MIL/uL — ABNORMAL LOW (ref 4.22–5.81)
Retic Count, Absolute: 72.7 10*3/uL (ref 19.0–186.0)
Retic Ct Pct: 2.1 % (ref 0.4–3.1)

## 2014-07-04 MED ORDER — SODIUM CHLORIDE 0.9 % IV BOLUS (SEPSIS)
1000.0000 mL | Freq: Once | INTRAVENOUS | Status: AC
  Administered 2014-07-04: 1000 mL via INTRAVENOUS

## 2014-07-04 MED ORDER — HYDROMORPHONE HCL 2 MG/ML IJ SOLN
2.0000 mg | Freq: Once | INTRAMUSCULAR | Status: AC
  Administered 2014-07-04: 2 mg via INTRAVENOUS
  Filled 2014-07-04: qty 1

## 2014-07-04 MED ORDER — AZITHROMYCIN 250 MG PO TABS: 250.0000 mg | ORAL_TABLET | Freq: Every day | ORAL | Status: DC

## 2014-07-04 NOTE — Discharge Instructions (Signed)
You were evaluated in the ED today for your cough and sickle cell anemia pain. There does not appear to be an emergent cause for your symptoms at this time. He reported feeling better after receiving pain medicine in the ED. There is not appear to be any evidence of acute chest syndrome at this time. However, if he began to have increased cough, fevers at home, shortness of breath it is important for you to return to the ED immediately.

## 2014-07-04 NOTE — ED Provider Notes (Signed)
CSN: [id number]     Arrival date & time 07/04/14  1354 History   First MD Initiated Contact with Patient 07/04/14 1358     Chief Complaint  Patient presents with  . Sickle Cell Pain Crisis  . Fever     (Consider location/radiation/quality/duration/timing/severity/associated sxs/prior Treatment) HPI [name] is a 39 y.o. male with a history of sickle cell, hemoglobin [place] comes in for evaluation of acute pain crisis. Patient says for the past 2 days he has had an exacerbation of his typical sickle cell pain in his bilateral ribs. He rates his discomfort as a 9/10. He has not been relieved with his home medications of morphine and Phenergan. He reports his last dose of pain medicines was taken this morning at 6 AM. Patient also endorses fevers of 101 over the past 2 days for which he has taken Alka-Seltzer and that has improved. He also reports a productive cough with yellow phlegm, denies hemoptysis. Denies feeling short of breath. He reports standing in a hot shower improves his discomfort. No other aggravating or modifying factors.  Past Medical History  Diagnosis Date  . Sickle cell crisis   . Blood transfusion   . Avascular necrosis of hip     bilateral  . Infection of bone, shoulder region     left shoulder  . Pneumonia   . Avascular necrosis of hip, left 08/27/2011   Past Surgical History  Procedure Laterality Date  . Orif right hip fracture  1995  . Joint replacement  2006    right total hip arthroplasty  . Bone graft hip iliac crest     Family History  Problem Relation Age of Onset  . Adopted: Yes  . Family history unknown: Yes   History  Substance Use Topics  . Smoking status: Current Some Day Smoker -- 0.50 packs/day    Types: Cigarettes    Last Attempt to Quit: 01/27/2012  . Smokeless tobacco: Never Used  . Alcohol Use: No    Review of Systems A 10 point review of systems was completed and was negative except for pertinent positives and negatives as mentioned  in the history of present illness     Allergies  Review of patient's allergies indicates no known allergies.  Home Medications   Prior to Admission medications   Medication Sig Start Date End Date Taking? Authorizing Provider  folic acid (FOLVITE) 1 MG tablet Take 1 mg by mouth daily.   Yes Historical Provider, MD  hydroxyurea (HYDREA) 500 MG capsule Take 2 capsules (1,000 mg total) by mouth daily. May take with food to minimize GI side effects. Patient taking differently: Take 500 mg by mouth 2 (two) times daily. May take with food to minimize GI side effects. 06/04/14  Yes Rometta Emery, MD  morphine (MS CONTIN) 60 MG 12 hr tablet Take 1 tablet (60 mg total) by mouth 2 (two) times daily. Patient taking differently: Take 60 mg by mouth 2 (two) times daily as needed for pain.  11/20/11  Yes Grayce Sessions, NP  morphine (MSIR) 30 MG tablet Take 30 mg by mouth every 4 (four) hours as needed for severe pain (pain).   Yes Historical Provider, MD  Phenyleph-CPM-DM-Aspirin (ALKA-SELTZER PLUS COLD & COUGH PO) Take 1 tablet by mouth daily as needed (cold symptoms).   Yes Historical Provider, MD  promethazine (PHENERGAN) 25 MG tablet Take 25 mg by mouth every 6 (six) hours as needed for nausea (nausea).    Yes Historical Provider,  MD  azithromycin (ZITHROMAX) 250 MG tablet Take 1 tablet (250 mg total) by mouth daily. Take first 2 tablets together, then 1 every day until finished. 07/04/14   Nadie Fiumara, PA-C   BP 133/79 mmHg  Pulse 85  Temp(Src) 99.2 F (37.3 C) (Oral)  Resp 18  SpO2 95% Physical Exam  Constitutional: He is oriented to person, place, and time. He appears well-developed and well-nourished.  HENT:  Head: Normocephalic and atraumatic.  Mouth/Throat: Oropharynx is clear and moist.  Eyes: Conjunctivae are normal. Pupils are equal, round, and reactive to light. Right eye exhibits no discharge. Left eye exhibits no discharge. No scleral icterus.  Neck: Neck supple.   Cardiovascular: Normal rate, regular rhythm and normal heart sounds.   Pulmonary/Chest: Effort normal and breath sounds normal. No respiratory distress. He has no wheezes. He has no rales.  Tenderness diffusely to bilateral ribs on axillary line. no obvious deformities, crepitus or lesions noted. No tachypnea or hypoxia. No evidence of respiratory distress.  Abdominal: Soft. There is no tenderness.  Musculoskeletal: He exhibits no tenderness.  Neurological: He is alert and oriented to person, place, and time.  Cranial Nerves II-XII grossly intact  Skin: Skin is warm and dry. No rash noted.  Psychiatric: He has a normal mood and affect.  Nursing note and vitals reviewed.   ED Course  Procedures (including critical care time) Labs Review Labs Reviewed  CBC WITH DIFFERENTIAL/PLATELET - Abnormal; Notable for the following:    RBC 3.46 (*)    Hemoglobin 11.7 (*)    HCT 32.4 (*)    MCHC 36.1 (*)    Monocytes Relative 14 (*)    Monocytes Absolute 1.2 (*)    All other components within normal limits  COMPREHENSIVE METABOLIC PANEL - Abnormal; Notable for the following:    Glucose, Bld 185 (*)    Total Bilirubin 3.1 (*)    All other components within normal limits  RETICULOCYTES - Abnormal; Notable for the following:    RBC. 3.46 (*)    All other components within normal limits    Imaging Review Dg Chest 2 View  07/04/2014   CLINICAL DATA:  Initial encounter for Sickle cell pain of two day duration.  EXAM: CHEST  2 VIEW  COMPARISON:  05/31/2014.  FINDINGS: The lungs are clear without focal infiltrate, edema, pneumothorax or pleural effusion. Interstitial markings are diffusely coarsened with chronic features. The cardiopericardial silhouette is within normal limits for size. Imaged bony structures of the thorax are intact.  IMPRESSION: No active cardiopulmonary disease.   Electronically Signed   By: Kennith CenterEric  Mansell M.D.   On: 07/04/2014 18:41     EKG Interpretation None     Meds given  in ED:  Medications  HYDROmorphone (DILAUDID) injection 2 mg (2 mg Intravenous Given 07/04/14 1827)  sodium chloride 0.9 % bolus 1,000 mL (0 mLs Intravenous Stopped 07/04/14 2010)  HYDROmorphone (DILAUDID) injection 2 mg (2 mg Intravenous Given 07/04/14 2010)    New Prescriptions   AZITHROMYCIN (ZITHROMAX) 250 MG TABLET    Take 1 tablet (250 mg total) by mouth daily. Take first 2 tablets together, then 1 every day until finished.   Filed Vitals:   07/04/14 1400 07/04/14 1615 07/04/14 1828  BP: 122/82 126/78 133/79  Pulse: 106 98 85  Temp: 98.6 F (37 C) 99.2 F (37.3 C)   TempSrc: Oral Oral   Resp: 17 16 18   SpO2: 97% 98% 95%    MDM  Vitals stable - WNL -afebrile.  Mild tachycardia on presentation likely secondary to pain, resolved. Pt resting comfortably in ED. Reports his pain has resolved with 2 rounds of analgesia in the ED. PE--benign lung exam Labwork--consistent with patient's baseline Imaging--chest x-ray shows no acute cardio pulmonary pathology. No evidence of consolidation  DDX--patient reports subjective fevers at home in conjunction with productive cough, however patient is afebrile here with normal chest x-ray. Maintains oxygen saturations above 95% on room air, no tachypnea, no evidence of respiratory distress. No evidence of acute chest syndrome. Discussed with patient if he begins to experience fevers, increased cough or any shortness of breath and he will need to return to the ED immediately for further evaluation. Will DC with empiric antibiotics. I discussed all relevant lab findings and imaging results with pt and they verbalized understanding. Discussed f/u with PCP within 48 hrs and return precautions, pt very amenable to plan.  Prior to patient discharge, I discussed and reviewed this case with Dr.Belfi   Final diagnoses:  Sickle cell anemia with pain       Joycie Peek, PA-C 07/04/14 5956  Rolan Bucco, MD 07/04/14 2255

## 2014-07-04 NOTE — ED Notes (Signed)
Pt c/o sickle cell pain pain in ribs x 2 days. Pt states that he also has had a fever.  Pt taking alka seltzer for the fever.

## 2014-10-12 ENCOUNTER — Encounter (HOSPITAL_COMMUNITY): Payer: Self-pay

## 2014-10-12 ENCOUNTER — Inpatient Hospital Stay (HOSPITAL_COMMUNITY)
Admission: EM | Admit: 2014-10-12 | Discharge: 2014-10-15 | DRG: 812 | Disposition: A | Discharge status: Home / Self Care | Payer: Medicare Other | Attending: Internal Medicine | Admitting: Internal Medicine

## 2014-10-12 ENCOUNTER — Emergency Department (HOSPITAL_COMMUNITY): Payer: Medicare Other

## 2014-10-12 DIAGNOSIS — D72829 Elevated white blood cell count, unspecified: Secondary | ICD-10-CM | POA: Diagnosis present

## 2014-10-12 DIAGNOSIS — K5900 Constipation, unspecified: Secondary | ICD-10-CM | POA: Diagnosis not present

## 2014-10-12 DIAGNOSIS — D57219 Sickle-cell/Hb-C disease with crisis, unspecified: Principal | ICD-10-CM

## 2014-10-12 DIAGNOSIS — Z79899 Other long term (current) drug therapy: Secondary | ICD-10-CM

## 2014-10-12 DIAGNOSIS — D5700 Hb-SS disease with crisis, unspecified: Secondary | ICD-10-CM

## 2014-10-12 DIAGNOSIS — Z96641 Presence of right artificial hip joint: Secondary | ICD-10-CM | POA: Diagnosis present

## 2014-10-12 DIAGNOSIS — F17210 Nicotine dependence, cigarettes, uncomplicated: Secondary | ICD-10-CM | POA: Diagnosis present

## 2014-10-12 LAB — CBC WITH DIFFERENTIAL/PLATELET
BASOS ABS: 0.1 10*3/uL (ref 0.0–0.1)
Basophils Relative: 1 % (ref 0–1)
EOS ABS: 0.8 10*3/uL — AB (ref 0.0–0.7)
Eosinophils Relative: 6 % — ABNORMAL HIGH (ref 0–5)
HCT: 31.5 % — ABNORMAL LOW (ref 39.0–52.0)
HEMOGLOBIN: 11.3 g/dL — AB (ref 13.0–17.0)
LYMPHS ABS: 2.6 10*3/uL (ref 0.7–4.0)
Lymphocytes Relative: 20 % (ref 12–46)
MCH: 33.2 pg (ref 26.0–34.0)
MCHC: 35.9 g/dL (ref 30.0–36.0)
MCV: 92.6 fL (ref 78.0–100.0)
MONO ABS: 1 10*3/uL (ref 0.1–1.0)
Monocytes Relative: 8 % (ref 3–12)
Neutro Abs: 8.3 10*3/uL — ABNORMAL HIGH (ref 1.7–7.7)
Neutrophils Relative %: 65 % (ref 43–77)
PLATELETS: 253 10*3/uL (ref 150–400)
RBC: 3.4 MIL/uL — AB (ref 4.22–5.81)
RDW: 14.8 % (ref 11.5–15.5)
WBC: 12.8 10*3/uL — ABNORMAL HIGH (ref 4.0–10.5)

## 2014-10-12 LAB — COMPREHENSIVE METABOLIC PANEL
ALBUMIN: 4.2 g/dL (ref 3.5–5.0)
ALK PHOS: 82 U/L (ref 38–126)
ALT: 21 U/L (ref 17–63)
ANION GAP: 10 (ref 5–15)
AST: 31 U/L (ref 15–41)
BILIRUBIN TOTAL: 2.3 mg/dL — AB (ref 0.3–1.2)
BUN: 12 mg/dL (ref 6–20)
CO2: 24 mmol/L (ref 22–32)
Calcium: 9.4 mg/dL (ref 8.9–10.3)
Chloride: 105 mmol/L (ref 101–111)
Creatinine, Ser: 0.92 mg/dL (ref 0.61–1.24)
GFR calc Af Amer: >60 mL/min (ref >60)
GFR calc non Af Amer: >60 mL/min (ref >60)
Glucose, Bld: 91 mg/dL (ref 65–99)
Potassium: 3.8 mmol/L (ref 3.5–5.1)
Sodium: 139 mmol/L (ref 135–145)
Total Protein: 7.9 g/dL (ref 6.5–8.1)

## 2014-10-12 LAB — RETICULOCYTES
RBC.: 3.4 MIL/uL — ABNORMAL LOW (ref 4.22–5.81)
RETIC CT PCT: 3.7 % — AB (ref 0.4–3.1)
Retic Count, Absolute: 125.8 10*3/uL (ref 19.0–186.0)

## 2014-10-12 MED ORDER — DOCUSATE SODIUM 100 MG PO CAPS
100.0000 mg | ORAL_CAPSULE | Freq: Two times a day (BID) | ORAL | Status: DC
  Administered 2014-10-12 – 2014-10-15 (×6): 100 mg via ORAL
  Filled 2014-10-12 (×7): qty 1

## 2014-10-12 MED ORDER — MAGNESIUM CITRATE PO SOLN: 1.0000 | Freq: Once | ORAL | Status: AC | PRN

## 2014-10-12 MED ORDER — KETOROLAC TROMETHAMINE 30 MG/ML IJ SOLN
30.0000 mg | Freq: Once | INTRAMUSCULAR | Status: AC
  Administered 2014-10-12: 30 mg via INTRAVENOUS
  Filled 2014-10-12: qty 1

## 2014-10-12 MED ORDER — DIPHENHYDRAMINE HCL 50 MG/ML IJ SOLN
25.0000 mg | Freq: Once | INTRAMUSCULAR | Status: AC
  Administered 2014-10-12: 25 mg via INTRAVENOUS
  Filled 2014-10-12: qty 1

## 2014-10-12 MED ORDER — HYDROMORPHONE 2 MG/ML HIGH CONCENTRATION IV PCA SOLN
INTRAVENOUS | Status: DC
  Administered 2014-10-12: 1 mg via INTRAVENOUS
  Administered 2014-10-13: 5 mg via INTRAVENOUS
  Administered 2014-10-13: 3 mg via INTRAVENOUS
  Administered 2014-10-13: 7 mg via INTRAVENOUS
  Filled 2014-10-12: qty 25

## 2014-10-12 MED ORDER — ONDANSETRON HCL 4 MG/2ML IJ SOLN
4.0000 mg | Freq: Four times a day (QID) | INTRAMUSCULAR | Status: DC | PRN
  Administered 2014-10-12 – 2014-10-14 (×6): 4 mg via INTRAVENOUS
  Filled 2014-10-12 (×6): qty 2

## 2014-10-12 MED ORDER — SODIUM CHLORIDE 0.9 % IV BOLUS (SEPSIS)
1000.0000 mL | Freq: Once | INTRAVENOUS | Status: AC
  Administered 2014-10-12: 1000 mL via INTRAVENOUS

## 2014-10-12 MED ORDER — SODIUM CHLORIDE 0.9 % IV SOLN
INTRAVENOUS | Status: AC
  Administered 2014-10-12: 19:00:00 via INTRAVENOUS

## 2014-10-12 MED ORDER — PROMETHAZINE HCL 25 MG PO TABS: 25.0000 mg | ORAL_TABLET | Freq: Four times a day (QID) | ORAL | Status: DC | PRN

## 2014-10-12 MED ORDER — DIPHENHYDRAMINE HCL 12.5 MG/5ML PO ELIX: 12.5000 mg | ORAL_SOLUTION | Freq: Four times a day (QID) | ORAL | Status: DC | PRN

## 2014-10-12 MED ORDER — FOLIC ACID 1 MG PO TABS
1.0000 mg | ORAL_TABLET | Freq: Every day | ORAL | Status: DC
  Administered 2014-10-12 – 2014-10-15 (×4): 1 mg via ORAL
  Filled 2014-10-12 (×4): qty 1

## 2014-10-12 MED ORDER — DIPHENHYDRAMINE HCL 50 MG/ML IJ SOLN
12.5000 mg | Freq: Four times a day (QID) | INTRAMUSCULAR | Status: DC | PRN
  Filled 2014-10-12: qty 0.25

## 2014-10-12 MED ORDER — PANTOPRAZOLE SODIUM 40 MG PO TBEC
40.0000 mg | DELAYED_RELEASE_TABLET | Freq: Every day | ORAL | Status: DC
Start: 2014-10-12 — End: 2014-10-15
  Administered 2014-10-12 – 2014-10-15 (×4): 40 mg via ORAL
  Filled 2014-10-12 (×4): qty 1

## 2014-10-12 MED ORDER — POLYETHYLENE GLYCOL 3350 17 G PO PACK
17.0000 g | PACK | Freq: Every day | ORAL | Status: DC
  Administered 2014-10-12 – 2014-10-15 (×4): 17 g via ORAL
  Filled 2014-10-12 (×4): qty 1

## 2014-10-12 MED ORDER — SENNA 8.6 MG PO TABS
1.0000 | ORAL_TABLET | Freq: Two times a day (BID) | ORAL | Status: DC
  Administered 2014-10-12 – 2014-10-14 (×5): 8.6 mg via ORAL
  Filled 2014-10-12 (×5): qty 1

## 2014-10-12 MED ORDER — SODIUM CHLORIDE 0.9 % IJ SOLN: 9.0000 mL | INTRAMUSCULAR | Status: DC | PRN

## 2014-10-12 MED ORDER — BISACODYL 5 MG PO TBEC: 5.0000 mg | DELAYED_RELEASE_TABLET | Freq: Every day | ORAL | Status: DC | PRN

## 2014-10-12 MED ORDER — NALOXONE HCL 0.4 MG/ML IJ SOLN: 0.4000 mg | INTRAMUSCULAR | Status: DC | PRN

## 2014-10-12 MED ORDER — HYDROMORPHONE HCL 2 MG/ML IJ SOLN
2.0000 mg | Freq: Once | INTRAMUSCULAR | Status: AC
  Administered 2014-10-12: 2 mg via INTRAVENOUS
  Filled 2014-10-12: qty 1

## 2014-10-12 MED ORDER — KETOROLAC TROMETHAMINE 30 MG/ML IJ SOLN
30.0000 mg | Freq: Three times a day (TID) | INTRAMUSCULAR | Status: DC
  Administered 2014-10-12 – 2014-10-13 (×3): 30 mg via INTRAVENOUS
  Filled 2014-10-12 (×6): qty 1

## 2014-10-12 MED ORDER — HYDROXYUREA 500 MG PO CAPS
500.0000 mg | ORAL_CAPSULE | Freq: Two times a day (BID) | ORAL | Status: DC
  Administered 2014-10-12 – 2014-10-15 (×6): 500 mg via ORAL
  Filled 2014-10-12 (×7): qty 1

## 2014-10-12 MED ORDER — HYDROMORPHONE HCL 1 MG/ML IJ SOLN
1.0000 mg | Freq: Once | INTRAMUSCULAR | Status: AC
  Administered 2014-10-12: 1 mg via INTRAVENOUS
  Filled 2014-10-12: qty 1

## 2014-10-12 MED ORDER — MORPHINE SULFATE ER 30 MG PO TBCR
60.0000 mg | EXTENDED_RELEASE_TABLET | Freq: Two times a day (BID) | ORAL | Status: DC
  Administered 2014-10-12 – 2014-10-15 (×6): 60 mg via ORAL
  Filled 2014-10-12 (×6): qty 2

## 2014-10-12 MED ORDER — ENOXAPARIN SODIUM 40 MG/0.4ML ~~LOC~~ SOLN
40.0000 mg | SUBCUTANEOUS | Status: DC
  Administered 2014-10-12 – 2014-10-14 (×3): 40 mg via SUBCUTANEOUS
  Filled 2014-10-12 (×4): qty 0.4

## 2014-10-12 NOTE — Progress Notes (Signed)
Report received from ED RN.

## 2014-10-12 NOTE — H&P (Signed)
[facility] History and Physical  ERDI BEVIL GGY:[id number] DOB: [birth year] DOA: 10/12/2014  Referring physician:  Arby Barrette PCP:  Dorrene German, MD   Chief Complaint:  Right chest pain  HPI:  The patient is a 39 y.o. year-old male with history of hemoglobin [place] disease previously followed at [facility], avascular necrosis of bilateral hips status post hip surgeries in 1995 and 2001, infrequent admissions for acute sickle cell pain crisis most recently in February of this year who presents with right chest pain.  The patient was last at their baseline health until 3 days prior to admission. He states he developed some pain in the right lateral chest wall which is similar to his previous pain crisis, 8 out of 10 up to 10 out of 10 at its worst, worse with deep inspiration. He states he has some mild shortness of breath only when trying to take a deep breath, and has had a mild cough for the same reason. He denies sinus congestion, sore throat, fevers. He denies any other areas of pain. He normally takes MS Contin 60 mg by mouth twice a day which he continued to take. At baseline he does not take any immediate release morphine but for the last few days he has been taking MSIR 30 mg approximally twice a day without adequate relief. He was unaware he should take any NSAIDs. He did push fluids. He denies any obvious triggers such as infections, exposure to heat or cold, dehydration. He has been compliant with his hydroxyurea and other medications.  In the emergency department, his vital signs are stable with 100% oxygen saturation on room air. White blood cell count of 12.8, hemoglobin at baseline 11.3, bilirubin also at baseline of 2.3, reticulocyte 3.7 also at baseline. Chest x-ray demonstrated no active cardiopulmonary disease, stable from prior. He was given Toradol 30 mg IV once, and 1 mg of IV Dilaudid twice with IV Benadryl and 2 L of IV fluids without relief. He is being admitted for sickle  cell pain crisis.  Review of Systems:  General:  Denies fevers, chills, weight loss or gain HEENT:  Denies changes to hearing and vision, rhinorrhea, sinus congestion, sore throat CV:  Denies palpitations, lower extremity edema.  PULM:  Per HPI GI:  Denies nausea, vomiting, constipation, diarrhea.   GU:  Denies dysuria, frequency, urgency ENDO:  Denies polyuria, polydipsia.   HEME:  Denies hematemesis, blood in stools, melena, abnormal bruising or bleeding.  LYMPH:  Denies lymphadenopathy.   MSK:  Denies arthralgias, myalgias except as noted in HPI DERM:  Denies skin rash or ulcer.   NEURO:  Denies focal numbness, weakness, slurred speech, confusion, facial droop.  PSYCH:  Denies anxiety and depression.    Past Medical History  Diagnosis Date  . Sickle cell crisis   . Blood transfusion   . Avascular necrosis of hip     bilateral  . Infection of bone, shoulder region     left shoulder  . Pneumonia   . Avascular necrosis of hip, left 08/27/2011   Past Surgical History  Procedure Laterality Date  . Orif right hip fracture  1995  . Joint replacement  2006    right total hip arthroplasty  . Bone graft hip iliac crest     Social History:  reports that he has been smoking Cigarettes.  He has been smoking about 0.50 packs per day. He has never used smokeless tobacco. He reports that he does not drink alcohol or use illicit drugs.  On disability.  No assist devices.  No home health services.  Lives with his father.    No Known Allergies  Family History  Problem Relation Age of Onset  . Adopted: Yes     Prior to Admission medications   Medication Sig Start Date End Date Taking? Authorizing Provider  folic acid (FOLVITE) 1 MG tablet Take 1 mg by mouth daily.   Yes Historical Provider, MD  hydroxyurea (HYDREA) 500 MG capsule Take 2 capsules (1,000 mg total) by mouth daily. May take with food to minimize GI side effects. Patient taking differently: Take 500 mg by mouth 2 (two) times  daily. May take with food to minimize GI side effects. 06/04/14  Yes Rometta Emery, MD  morphine (MS CONTIN) 60 MG 12 hr tablet Take 1 tablet (60 mg total) by mouth 2 (two) times daily. Patient taking differently: Take 60 mg by mouth 2 (two) times daily as needed for pain.  11/20/11  Yes Grayce Sessions, NP  morphine (MSIR) 30 MG tablet Take 30 mg by mouth every 4 (four) hours as needed for severe pain (pain).   Yes Historical Provider, MD  promethazine (PHENERGAN) 25 MG tablet Take 25 mg by mouth every 6 (six) hours as needed for nausea (nausea).    Yes Historical Provider, MD  azithromycin (ZITHROMAX) 250 MG tablet Take 1 tablet (250 mg total) by mouth daily. Take first 2 tablets together, then 1 every day until finished. Patient not taking: Reported on 10/12/2014 07/04/14   Joycie Peek, PA-C   Physical Exam: Filed Vitals:   10/12/14 1500 10/12/14 1530 10/12/14 1657 10/12/14 1751  BP: 107/75 110/73 116/70 124/75  Pulse: 56 54 57 60  Temp:   98 F (36.7 C) 97.8 F (36.6 C)  TempSrc:   Oral Oral  Resp: Height:    6' (1.829 m)  Weight:    83.915 kg (185 lb)  SpO2: 96% 94% 96% 100%     General:   Adult male, no acute distress, lying on left side on stretcher  Eyes:  PERRL, anicteric, non-injected.  ENT:  Nares clear.  OP clear, non-erythematous without plaques or exudates.  MMM.  Neck:  Supple without TM or JVD.    Lymph:  No cervical, supraclavicular, or submandibular LAD.  Cardiovascular:  RRR, normal S1, S2, without m/r/g.  2+ pulses, warm extremities  Respiratory:  CTA bilaterally without increased WOB.  He hesitates when he tries to take a deep inspiration   Abdomen:  NABS.  Soft, ND/NT.    Skin:  No rashes or focal lesions.  Musculoskeletal:  Normal bulk and tone.  No LE edema.  Tender to palpation in the right axilla overall of his ribs down to the right flank. No overlying rash, erythema, bruising.   Psychiatric:  A & O x 4.  Appropriate  affect.  Neurologic:  CN 3-12 intact.  5/5 strength.  Sensation intact.  Labs on Admission:  Basic Metabolic Panel:  Recent Labs Lab 10/12/14 1135  NA 139  K 3.8  CL 105  CO2 24  GLUCOSE 91  BUN 12  CREATININE 0.92  CALCIUM 9.4   Liver Function Tests:  Recent Labs Lab 10/12/14 1135  AST 31  ALT 21  ALKPHOS 82  BILITOT 2.3*  PROT 7.9  ALBUMIN 4.2   No results for input(s): LIPASE, AMYLASE in the last 168 hours. No results for input(s): AMMONIA in the last 168 hours. CBC:  Recent Labs Lab  10/12/14 1135  WBC 12.8*  NEUTROABS 8.3*  HGB 11.3*  HCT 31.5*  MCV 92.6  PLT 253   Cardiac Enzymes: No results for input(s): CKTOTAL, CKMB, CKMBINDEX, TROPONINI in the last 168 hours.  BNP (last 3 results) No results for input(s): BNP in the last 8760 hours.  ProBNP (last 3 results) No results for input(s): PROBNP in the last 8760 hours.  CBG: No results for input(s): GLUCAP in the last 168 hours.  Radiological Exams on Admission: Dg Chest 2 View  10/12/2014   CLINICAL DATA:  Sickle cell crisis. RIGHT lateral chest wall pain for 2-3 days.  EXAM: CHEST  2 VIEW  COMPARISON:  07/04/2014.  FINDINGS: The heart size and mediastinal contours are within normal limits. Both lungs are free of infiltrates. Slight BILATERAL pulmonary scarring, stable. Sclerosis of the LEFT humeral head, likely avascular necrosis. Subtle vertebral body endplate irregularities, consistent with sickle cell osteopathy.  IMPRESSION: No active cardiopulmonary disease.  Stable appearance from priors.   Electronically Signed   By: Elsie Stain M.D.   On: 10/12/2014 12:05    EKG: Normal sinus rhythm with chronic stable ST segment elevations in the lateral leads.  Assessment/Plan Active Problems:   Sickle cell pain crisis  ---  Pain likely secondary to sickle cell vaso occlusive crisis of the right chest.  Acute chest less likely given no pulmonary findings on exam or CXR, stable hemoglobin and normal  oxygen saturations. -  Continue longacting oral narcotic -  D/c oral IR narcotic -  Start dilaudid PCA -  Toradol 30 mg IV q8h -  Protonix  PO daily for GI prophylaxis -  Continue folate -  Continue hydroxyurea -  Transfer to sickle cell service in AM -  Daily miralax and prn bisacodyl  Diet:  regular Access:  PIV IVF:  off Proph:  lovenox  Code Status: full Family Communication: patient alone Disposition Plan: Admit to med-surg  Time spent: 60 min Lylah Lantis [facility] Pager [phone]  If 7PM-7AM, please contact night-coverage [email or web address] Password [facility] 10/12/2014, 6:35 PM

## 2014-10-12 NOTE — ED Provider Notes (Signed)
CSN: [id number]     Arrival date & time 10/12/14  1057 History   First MD Initiated Contact with Patient 10/12/14 1324     Chief Complaint  Patient presents with  . Sickle Cell Pain Crisis  . Ribcage pain      (Consider location/radiation/quality/duration/timing/severity/associated sxs/prior Treatment) HPI Patient ports that he had right-sided chest pain for about 2-3 days. He describes it as fairly sharp in nature. Associated symptoms. He does not feel short of breath. There has not been cough or fever. He denies lower extremity pain or swelling. The patient has no prior history of DVT. He reports that he is taking his usual medications without relief. He reports he has had similar area of pain exacerbations over the past years without complications. Past Medical History  Diagnosis Date  . Sickle cell crisis   . Blood transfusion   . Avascular necrosis of hip     bilateral  . Infection of bone, shoulder region     left shoulder  . Pneumonia   . Avascular necrosis of hip, left 08/27/2011   Past Surgical History  Procedure Laterality Date  . Orif right hip fracture  1995  . Joint replacement  2006    right total hip arthroplasty  . Bone graft hip iliac crest     Family History  Problem Relation Age of Onset  . Adopted: Yes  . Family history unknown: Yes   History  Substance Use Topics  . Smoking status: Current Some Day Smoker -- 0.50 packs/day    Types: Cigarettes    Last Attempt to Quit: 01/27/2012  . Smokeless tobacco: Never Used  . Alcohol Use: No    Review of Systems 10 Systems reviewed and are negative for acute change except as noted in the HPI.    Allergies  Review of patient's allergies indicates no known allergies.  Home Medications   Prior to Admission medications   Medication Sig Start Date End Date Taking? Authorizing Provider  folic acid (FOLVITE) 1 MG tablet Take 1 mg by mouth daily.   Yes Historical Provider, MD  hydroxyurea (HYDREA) 500 MG  capsule Take 2 capsules (1,000 mg total) by mouth daily. May take with food to minimize GI side effects. Patient taking differently: Take 500 mg by mouth 2 (two) times daily. May take with food to minimize GI side effects. 06/04/14  Yes Rometta Emery, MD  morphine (MS CONTIN) 60 MG 12 hr tablet Take 1 tablet (60 mg total) by mouth 2 (two) times daily. Patient taking differently: Take 60 mg by mouth 2 (two) times daily as needed for pain.  11/20/11  Yes Grayce Sessions, NP  morphine (MSIR) 30 MG tablet Take 30 mg by mouth every 4 (four) hours as needed for severe pain (pain).   Yes Historical Provider, MD  promethazine (PHENERGAN) 25 MG tablet Take 25 mg by mouth every 6 (six) hours as needed for nausea (nausea).    Yes Historical Provider, MD  azithromycin (ZITHROMAX) 250 MG tablet Take 1 tablet (250 mg total) by mouth daily. Take first 2 tablets together, then 1 every day until finished. Patient not taking: Reported on 10/12/2014 07/04/14   Joycie Peek, PA-C   BP 110/73 mmHg  Pulse 54  Temp(Src) 98.3 F (36.8 C) (Oral)  Resp 15  SpO2 94% Physical Exam  Constitutional: He is oriented to person, place, and time. He appears well-developed and well-nourished.  HENT:  Head: Normocephalic and atraumatic.  Eyes: EOM are normal. Pupils  are equal, round, and reactive to light.  Neck: Neck supple.  Cardiovascular: Normal rate, regular rhythm, normal heart sounds and intact distal pulses.   Pulmonary/Chest: Effort normal and breath sounds normal.  Abdominal: Soft. Bowel sounds are normal. He exhibits no distension. There is no tenderness.  Musculoskeletal: Normal range of motion. He exhibits no edema.  Neurological: He is alert and oriented to person, place, and time. He has normal strength. Coordination normal. GCS eye subscore is 4. GCS verbal subscore is 5. GCS motor subscore is 6.  Skin: Skin is warm, dry and intact.  Psychiatric: He has a normal mood and affect.    ED Course   Procedures (including critical care time) Labs Review Labs Reviewed  CBC WITH DIFFERENTIAL/PLATELET - Abnormal; Notable for the following:    WBC 12.8 (*)    RBC 3.40 (*)    Hemoglobin 11.3 (*)    HCT 31.5 (*)    Eosinophils Relative 6 (*)    Neutro Abs 8.3 (*)    Eosinophils Absolute 0.8 (*)    All other components within normal limits  COMPREHENSIVE METABOLIC PANEL - Abnormal; Notable for the following:    Total Bilirubin 2.3 (*)    All other components within normal limits  RETICULOCYTES - Abnormal; Notable for the following:    Retic Ct Pct 3.7 (*)    RBC. 3.40 (*)    All other components within normal limits    Imaging Review Dg Chest 2 View  10/12/2014   CLINICAL DATA:  Sickle cell crisis. RIGHT lateral chest wall pain for 2-3 days.  EXAM: CHEST  2 VIEW  COMPARISON:  07/04/2014.  FINDINGS: The heart size and mediastinal contours are within normal limits. Both lungs are free of infiltrates. Slight BILATERAL pulmonary scarring, stable. Sclerosis of the LEFT humeral head, likely avascular necrosis. Subtle vertebral body endplate irregularities, consistent with sickle cell osteopathy.  IMPRESSION: No active cardiopulmonary disease.  Stable appearance from priors.   Electronically Signed   By: Elsie Stain M.D.   On: 10/12/2014 12:05     EKG Interpretation None      MDM   Final diagnoses:  Sickle cell pain crisis   Patient presents as outlined above. He is treated with fluids, Benadryl, Toradol and Dilaudid. Patient continues to have pain. He is stable with no respiratory distress, clear mental status and stable blood pressures. He'lll be admitted for ongoing treatment of sickle cell pain crisis.    Arby Barrette, MD 10/12/14 [id number]

## 2014-10-12 NOTE — ED Notes (Signed)
Pt c/o bilateral ribcage pain from Sickle cell crisis x 2-3 days.  Pain score 8/10.  Pt reports taking prescribed medications w/o relief.

## 2014-10-13 DIAGNOSIS — D57219 Sickle-cell/Hb-C disease with crisis, unspecified: Principal | ICD-10-CM

## 2014-10-13 LAB — CBC WITH DIFFERENTIAL/PLATELET
BASOS PCT: 0 % (ref 0–1)
Basophils Absolute: 0 10*3/uL (ref 0.0–0.1)
EOS ABS: 0.7 10*3/uL (ref 0.0–0.7)
Eosinophils Relative: 5 % (ref 0–5)
HEMATOCRIT: 27.7 % — AB (ref 39.0–52.0)
HEMOGLOBIN: 10.1 g/dL — AB (ref 13.0–17.0)
Lymphocytes Relative: 23 % (ref 12–46)
Lymphs Abs: 3.2 10*3/uL (ref 0.7–4.0)
MCH: 34 pg (ref 26.0–34.0)
MCHC: 36.5 g/dL — ABNORMAL HIGH (ref 30.0–36.0)
MCV: 93.3 fL (ref 78.0–100.0)
Monocytes Absolute: 0.9 10*3/uL (ref 0.1–1.0)
Monocytes Relative: 7 % (ref 3–12)
NEUTROS PCT: 65 % (ref 43–77)
Neutro Abs: 9.2 10*3/uL — ABNORMAL HIGH (ref 1.7–7.7)
PLATELETS: 248 10*3/uL (ref 150–400)
RBC: 2.97 MIL/uL — ABNORMAL LOW (ref 4.22–5.81)
RDW: 14.5 % (ref 11.5–15.5)
WBC: 14 10*3/uL — ABNORMAL HIGH (ref 4.0–10.5)

## 2014-10-13 LAB — COMPREHENSIVE METABOLIC PANEL
ALBUMIN: 4.2 g/dL (ref 3.5–5.0)
ALT: 19 U/L (ref 17–63)
AST: 27 U/L (ref 15–41)
Alkaline Phosphatase: 72 U/L (ref 38–126)
Anion gap: 8 (ref 5–15)
BUN: 11 mg/dL (ref 6–20)
CALCIUM: 8.9 mg/dL (ref 8.9–10.3)
CO2: 28 mmol/L (ref 22–32)
Chloride: 105 mmol/L (ref 101–111)
Creatinine, Ser: 0.77 mg/dL (ref 0.61–1.24)
GFR calc Af Amer: >60 mL/min (ref >60)
GFR calc non Af Amer: >60 mL/min (ref >60)
Glucose, Bld: 89 mg/dL (ref 65–99)
Potassium: 4 mmol/L (ref 3.5–5.1)
SODIUM: 141 mmol/L (ref 135–145)
Total Bilirubin: 2.3 mg/dL — ABNORMAL HIGH (ref 0.3–1.2)
Total Protein: 7.4 g/dL (ref 6.5–8.1)

## 2014-10-13 LAB — RETICULOCYTES
RBC.: 2.97 MIL/uL — ABNORMAL LOW (ref 4.22–5.81)
RETIC CT PCT: 3.7 % — AB (ref 0.4–3.1)
Retic Count, Absolute: 109.9 10*3/uL (ref 19.0–186.0)

## 2014-10-13 MED ORDER — DEXTROSE-NACL 5-0.45 % IV SOLN
INTRAVENOUS | Status: DC
  Administered 2014-10-13: 1000 mL via INTRAVENOUS
  Administered 2014-10-14: 12:00:00 via INTRAVENOUS

## 2014-10-13 MED ORDER — KETOROLAC TROMETHAMINE 30 MG/ML IJ SOLN
30.0000 mg | Freq: Four times a day (QID) | INTRAMUSCULAR | Status: DC
  Administered 2014-10-13 – 2014-10-15 (×7): 30 mg via INTRAVENOUS
  Filled 2014-10-13 (×10): qty 1

## 2014-10-13 MED ORDER — HYDROMORPHONE 2 MG/ML HIGH CONCENTRATION IV PCA SOLN
INTRAVENOUS | Status: DC
  Administered 2014-10-13: 4.9 mg via INTRAVENOUS
  Administered 2014-10-13: 4.2 mg via INTRAVENOUS
  Administered 2014-10-14: 04:00:00 via INTRAVENOUS
  Administered 2014-10-14: 4.2 mg via INTRAVENOUS
  Administered 2014-10-14 (×2): 4.9 mg via INTRAVENOUS
  Administered 2014-10-14: 3.5 mg via INTRAVENOUS
  Administered 2014-10-14: 1.4 mg via INTRAVENOUS
  Administered 2014-10-14: 2.1 mg via INTRAVENOUS
  Administered 2014-10-15: 6.3 mg via INTRAVENOUS
  Administered 2014-10-15: 7.7 mg via INTRAVENOUS
  Administered 2014-10-15: 4.2 mg via INTRAVENOUS
  Filled 2014-10-13 (×2): qty 25

## 2014-10-13 NOTE — Progress Notes (Signed)
Patient on high dose Dilaudid PCA,used 15 mg from 2204-0756, total demands-20 and 15 delivered.

## 2014-10-13 NOTE — Progress Notes (Signed)
Patient ID: [name], male   DOB: 09/24/1975, 39 y.o.   MRN: [id number] SICKLE CELL SERVICE PROGRESS NOTE  [name] [id number]:[id number] DOB: 12-Feb-1976 DOA: 10/12/2014 PCP: Dorrene German, MD    Consultants:  None  Procedures:  none  Antibiotics:  none  HPI/Subjective: Pt's states that pain has been mainly in his right ribs for the past 2-3 days. Pain was a 9/10 when he came but is now a 8/10. He denies cough, SOB, HA, dizziness, n/v, diarrhea, rash, weakness. Last bowel movement was two days ago. He complains of not being able to sleep because his PCA continues to beep.  Objective: Filed Vitals:   10/13/14 0800 10/13/14 1000 10/13/14 1142 10/13/14 1304  BP:  127/74  111/71  Pulse:  65  63  Temp:  97.9 F (36.6 C)    TempSrc:  Oral    Resp: 12 14 11 16   Height:      Weight:      SpO2: 95% 96% 95% 99%   Weight change:   Intake/Output Summary (Last 24 hours) at 10/13/14 1438 Last data filed at 10/13/14 1357  Gross per 24 hour  Intake 3293.03 ml  Output   2250 ml  Net 1043.03 ml    General: Alert, awake, oriented x3, in no acute distress.  HEENT: [place]/AT PEERL, EOMI Neck: Trachea midline,  no masses or lymphadenopathy OROPHARYNX:  Moist, No exudate/ erythema/lesions.  Heart: Regular rate and rhythm, without murmurs, rubs, gallops Lungs: Clear to auscultation, no wheezing or rhonchi noted.  Abdomen: Soft, nontender, nondistended, positive bowel sounds, no masses no hepatosplenomegaly noted..  Neuro: No focal neurological deficits noted cranial nerves II through XII grossly intact. Strength 5 out of 5 in bilateral upper and lower extremities. Musculoskeletal: No warm swelling or erythema around joints, no spinal tenderness noted. Psychiatric: Patient alert and oriented x3, good insight and cognition, good recent to remote recall. Lymph node survey: No cervical axillary or inguinal lymphadenopathy noted. Extremities: no clubbing, cyanosis, or edema  Data  Reviewed: Basic Metabolic Panel:  Recent Labs Lab 10/12/14 1135 10/13/14 0509  NA 139 141  K 3.8 4.0  CL 105 105  CO2 24 28  GLUCOSE 91 89  BUN 12 11  CREATININE 0.92 0.77  CALCIUM 9.4 8.9   Liver Function Tests:  Recent Labs Lab 10/12/14 1135 10/13/14 0509  AST 31 27  ALT 21 19  ALKPHOS 82 72  BILITOT 2.3* 2.3*  PROT 7.9 7.4  ALBUMIN 4.2 4.2   No results for input(s): LIPASE, AMYLASE in the last 168 hours. No results for input(s): AMMONIA in the last 168 hours. CBC:  Recent Labs Lab 10/12/14 1135 10/13/14 0509  WBC 12.8* 14.0*  NEUTROABS 8.3* 9.2*  HGB 11.3* 10.1*  HCT 31.5* 27.7*  MCV 92.6 93.3  PLT 253 248   Cardiac Enzymes: No results for input(s): CKTOTAL, CKMB, CKMBINDEX, TROPONINI in the last 168 hours. BNP (last 3 results) No results for input(s): BNP in the last 8760 hours.  ProBNP (last 3 results) No results for input(s): PROBNP in the last 8760 hours.  CBG: No results for input(s): GLUCAP in the last 168 hours.  No results found for this or any previous visit (from the past 240 hour(s)).   Studies: Dg Chest 2 View  10/12/2014   CLINICAL DATA:  Sickle cell crisis. RIGHT lateral chest wall pain for 2-3 days.  EXAM: CHEST  2 VIEW  COMPARISON:  07/04/2014.  FINDINGS: The heart size and mediastinal contours  are within normal limits. Both lungs are free of infiltrates. Slight BILATERAL pulmonary scarring, stable. Sclerosis of the LEFT humeral head, likely avascular necrosis. Subtle vertebral body endplate irregularities, consistent with sickle cell osteopathy.  IMPRESSION: No active cardiopulmonary disease.  Stable appearance from priors.   Electronically Signed   By: Elsie Stain M.D.   On: 10/12/2014 12:05    Scheduled Meds: . docusate sodium  100 mg Oral BID  . enoxaparin (LOVENOX) injection  40 mg Subcutaneous Q24H  . folic acid  1 mg Oral Daily  . HYDROmorphone PCA 2 mg/mL   Intravenous 6 times per day  . hydroxyurea  500 mg Oral BID  .  ketorolac  30 mg Intravenous 3 times per day  . morphine  60 mg Oral BID  . pantoprazole  40 mg Oral Daily  . polyethylene glycol  17 g Oral Daily  . senna  1 tablet Oral BID   Continuous Infusions: . dextrose 5 % and 0.45% NaCl 1,000 mL (10/13/14 1141)    Principal Problem:   Hemoglobin [place] with crisis Active Problems:   Sickle cell pain crisis   Assessment/Plan: Principal Problem:   Hemoglobin [place] with crisis Active Problems:   Sickle cell pain crisis  1)Sickle Cell Crisis: Hgb [place]/opioid tolerant (MME=240). Pt presented with pain consistent with VOC. Pt's pain is improving. He was on a Dilaudid PCA of  q10 min but his HR was as low as 48 which would set off his PCA. Will decrease Dilaudid PCA to 0.7mg  q10 min, with the same lockout of /hr.  Will continue Toradol but schedule it at every 6 hours. Continue MS contin  q12h  for long acting control.  2) Anemia: Hgb of 10 which is around his baseline. Will continue to monitor. Transfuse if Hgb less than <6.5 or symptomatic 3)Leukocytosis: Likely secondary to acute crisis. No signs of infection. Will monitor 3)Sickle Cell Care: Continue Folic acid and hydroxyurea.  4) FEN/GI : Regular Diet   IV fluids per protocol-D5/0.45% /hr Bowel regimen in place   Code Status: Full  DVT Prophylaxis: enoxaparin  Family Communication: none  Disposition Plan: none  Time spent:67min  Serina Cowper  Pager [phone]. If 7PM-7AM, please contact night-coverage.  10/13/2014, 2:38 PM  LOS: 1 day   Serina Cowper

## 2014-10-14 LAB — COMPREHENSIVE METABOLIC PANEL
ALK PHOS: 77 U/L (ref 38–126)
ALT: 18 U/L (ref 17–63)
AST: 28 U/L (ref 15–41)
Albumin: 4.1 g/dL (ref 3.5–5.0)
Anion gap: 9 (ref 5–15)
BUN: 6 mg/dL (ref 6–20)
CO2: 29 mmol/L (ref 22–32)
Calcium: 9.2 mg/dL (ref 8.9–10.3)
Chloride: 102 mmol/L (ref 101–111)
Creatinine, Ser: 0.73 mg/dL (ref 0.61–1.24)
GFR calc Af Amer: >60 mL/min (ref >60)
GFR calc non Af Amer: >60 mL/min (ref >60)
Glucose, Bld: 91 mg/dL (ref 65–99)
Potassium: 4 mmol/L (ref 3.5–5.1)
SODIUM: 140 mmol/L (ref 135–145)
TOTAL PROTEIN: 7.4 g/dL (ref 6.5–8.1)
Total Bilirubin: 2.1 mg/dL — ABNORMAL HIGH (ref 0.3–1.2)

## 2014-10-14 LAB — CBC WITH DIFFERENTIAL/PLATELET
BASOS PCT: 0 % (ref 0–1)
Basophils Absolute: 0 10*3/uL (ref 0.0–0.1)
EOS ABS: 0.8 10*3/uL — AB (ref 0.0–0.7)
Eosinophils Relative: 8 % — ABNORMAL HIGH (ref 0–5)
HCT: 28.6 % — ABNORMAL LOW (ref 39.0–52.0)
Hemoglobin: 10.1 g/dL — ABNORMAL LOW (ref 13.0–17.0)
LYMPHS ABS: 2.9 10*3/uL (ref 0.7–4.0)
Lymphocytes Relative: 26 % (ref 12–46)
MCH: 33.1 pg (ref 26.0–34.0)
MCHC: 35.3 g/dL (ref 30.0–36.0)
MCV: 93.8 fL (ref 78.0–100.0)
MONO ABS: 1 10*3/uL (ref 0.1–1.0)
Monocytes Relative: 9 % (ref 3–12)
NEUTROS ABS: 6.2 10*3/uL (ref 1.7–7.7)
Neutrophils Relative %: 57 % (ref 43–77)
Platelets: 244 10*3/uL (ref 150–400)
RBC: 3.05 MIL/uL — AB (ref 4.22–5.81)
RDW: 14.5 % (ref 11.5–15.5)
WBC: 10.8 10*3/uL — ABNORMAL HIGH (ref 4.0–10.5)

## 2014-10-14 LAB — RETICULOCYTES
RBC.: 3.05 MIL/uL — AB (ref 4.22–5.81)
Retic Count, Absolute: 82.4 10*3/uL (ref 19.0–186.0)
Retic Ct Pct: 2.7 % (ref 0.4–3.1)

## 2014-10-14 LAB — LACTATE DEHYDROGENASE: LDH: 178 U/L (ref 98–192)

## 2014-10-14 MED ORDER — NALOXEGOL OXALATE 25 MG PO TABS
25.0000 mg | ORAL_TABLET | Freq: Every day | ORAL | Status: DC
  Administered 2014-10-14: 25 mg via ORAL
  Filled 2014-10-14: qty 1

## 2014-10-14 NOTE — Progress Notes (Signed)
Utilization review completed.  

## 2014-10-14 NOTE — Progress Notes (Signed)
Patient ID: [name], male   DOB: 1975/11/21, 39 y.o.   MRN: [id number] SICKLE CELL SERVICE PROGRESS NOTE  [name] [id number] DOB: 28-Mar-1976 DOA: 10/12/2014 PCP: Malik German, MD    Consultants:  None  Procedures:  none  Antibiotics:  none  HPI/Subjective: Pt's states that pain has is still in his right ribs. Pain is a 7/10 now. His only complaint today was that is last bowel movement was three days ago. He has used Senokot and Miralax without relief. He is eating and drinking better today.  Objective: Filed Vitals:   10/14/14 0353 10/14/14 0548 10/14/14 0836 10/14/14 1112  BP:  116/80  123/85  Pulse:  57  72  Temp:  98 F (36.7 C)  97.4 F (36.3 C)  TempSrc:  Oral  Oral  Resp: 11 13 12 16   Height:      Weight:      SpO2: 92% 98% 97% 94%   Weight change:   Intake/Output Summary (Last 24 hours) at 10/14/14 1215 Last data filed at 10/14/14 1200  Gross per 24 hour  Intake 833.03 ml  Output   4950 ml  Net -4116.97 ml    General: Alert, awake, oriented x3, in no acute distress.  HEENT: [place]/AT PEERL, EOMI Neck: Trachea midline,  no masses or lymphadenopathy OROPHARYNX:  Moist, No exudate/ erythema/lesions.  Heart: Regular rate and rhythm, without murmurs, rubs, gallops Lungs: Clear to auscultation, no wheezing or rhonchi noted.  Abdomen: Soft, nontender, nondistended, positive bowel sounds, no masses no hepatosplenomegaly noted..  Neuro: No focal neurological deficits noted cranial nerves II through XII grossly intact. Strength 5 out of 5 in bilateral upper and lower extremities. Musculoskeletal: No warm swelling or erythema around joints, no spinal tenderness noted. Psychiatric: Patient alert and oriented x3, good insight and cognition, good recent to remote recall. Lymph node survey: No cervical axillary or inguinal lymphadenopathy noted. Extremities: no clubbing, cyanosis, or edema -unchanged  Data Reviewed: Basic Metabolic Panel:  Recent  Labs Lab 10/12/14 1135 10/13/14 0509 10/14/14 0355  NA 139 141 140  K 3.8 4.0 4.0  CL 105 105 102  CO2 24 28 29   GLUCOSE 91 89 91  BUN 12 11 6   CREATININE 0.92 0.77 0.73  CALCIUM 9.4 8.9 9.2   Liver Function Tests:  Recent Labs Lab 10/12/14 1135 10/13/14 0509 10/14/14 0355  AST 31 27 28   ALT 21 19 18   ALKPHOS 82 72 77  BILITOT 2.3* 2.3* 2.1*  PROT 7.9 7.4 7.4  ALBUMIN 4.2 4.2 4.1   No results for input(s): LIPASE, AMYLASE in the last 168 hours. No results for input(s): AMMONIA in the last 168 hours. CBC:  Recent Labs Lab 10/12/14 1135 10/13/14 0509 10/14/14 0355  WBC 12.8* 14.0* 10.8*  NEUTROABS 8.3* 9.2* 6.2  HGB 11.3* 10.1* 10.1*  HCT 31.5* 27.7* 28.6*  MCV 92.6 93.3 93.8  PLT 253 248 244   Cardiac Enzymes: No results for input(s): CKTOTAL, CKMB, CKMBINDEX, TROPONINI in the last 168 hours. BNP (last 3 results) No results for input(s): BNP in the last 8760 hours.  ProBNP (last 3 results) No results for input(s): PROBNP in the last 8760 hours.  CBG: No results for input(s): GLUCAP in the last 168 hours.  No results found for this or any previous visit (from the past 240 hour(s)).   Studies: Dg Chest 2 View  10/12/2014   CLINICAL DATA:  Sickle cell crisis. RIGHT lateral chest wall pain for 2-3 days.  EXAM:  CHEST  2 VIEW  COMPARISON:  07/04/2014.  FINDINGS: The heart size and mediastinal contours are within normal limits. Both lungs are free of infiltrates. Slight BILATERAL pulmonary scarring, stable. Sclerosis of the LEFT humeral head, likely avascular necrosis. Subtle vertebral body endplate irregularities, consistent with sickle cell osteopathy.  IMPRESSION: No active cardiopulmonary disease.  Stable appearance from priors.   Electronically Signed   By: [name] M.D.   On: 10/12/2014 12:05    Scheduled Meds: . docusate sodium  100 mg Oral BID  . enoxaparin (LOVENOX) injection  40 mg Subcutaneous Q24H  . folic acid  1 mg Oral Daily  .  HYDROmorphone PCA 2 mg/mL   Intravenous 6 times per day  . hydroxyurea  500 mg Oral BID  . ketorolac  30 mg Intravenous 4 times per day  . morphine  60 mg Oral BID  . naloxegol oxalate  25 mg Oral Daily  . pantoprazole  40 mg Oral Daily  . polyethylene glycol  17 g Oral Daily  . senna  1 tablet Oral BID   Continuous Infusions: . dextrose 5 % and 0.45% NaCl 125 mL/hr at 10/14/14 1159    Principal Problem:   Hemoglobin [place] with crisis Active Problems:   Leukocytosis   Sickle cell pain crisis   Assessment/Plan: Principal Problem:   Hemoglobin [place] with crisis Active Problems:   Leukocytosis   Sickle cell pain crisis  1)Sickle Cell Crisis: Hgb [place]/opioid tolerant. Pt has pain consistent with VOC. Pt's pain is improving. Will continue Dilaudid PCA to 0.7mg  q10 min, with lockout of /hr.  Will continue Toradol and MS contin  q12h  for long acting control.   2) Anemia: Hgb of 10 which is around his baseline. Will continue to monitor. Transfuse if Hgb less than <6.5 or symptomatic  3)Leukocytosis: Likely secondary to acute crisis. No signs of infection. Will monitor.  4)Sickle Cell Care: Continue Folic acid and hydroxyurea.   5)Constipation: Likely opioid induced. Will give Movantik and monitor.  6) FEN/GI : Regular Diet   IV fluids per protocol-discontinue IVF as he is eating and drinking normally Bowel regimen in place   Code Status: Full  DVT Prophylaxis: enoxaparin  Family Communication: none  Disposition Plan: none  Time spent:40min  Serina Cowper  Pager [phone]. If 7PM-7AM, please contact night-coverage.  10/14/2014, 12:15 PM  LOS: 2 days   Serina Cowper

## 2014-10-15 LAB — CBC WITH DIFFERENTIAL/PLATELET
BASOS ABS: 0.1 10*3/uL (ref 0.0–0.1)
BASOS PCT: 1 % (ref 0–1)
EOS ABS: 1 10*3/uL — AB (ref 0.0–0.7)
EOS PCT: 10 % — AB (ref 0–5)
HCT: 29.1 % — ABNORMAL LOW (ref 39.0–52.0)
HEMOGLOBIN: 10.2 g/dL — AB (ref 13.0–17.0)
Lymphocytes Relative: 27 % (ref 12–46)
Lymphs Abs: 2.7 10*3/uL (ref 0.7–4.0)
MCH: 32.2 pg (ref 26.0–34.0)
MCHC: 35.1 g/dL (ref 30.0–36.0)
MCV: 91.8 fL (ref 78.0–100.0)
MONO ABS: 0.9 10*3/uL (ref 0.1–1.0)
MONOS PCT: 9 % (ref 3–12)
NEUTROS ABS: 5.3 10*3/uL (ref 1.7–7.7)
Neutrophils Relative %: 53 % (ref 43–77)
Platelets: 255 10*3/uL (ref 150–400)
RBC: 3.17 MIL/uL — ABNORMAL LOW (ref 4.22–5.81)
RDW: 14.6 % (ref 11.5–15.5)
WBC: 10 10*3/uL (ref 4.0–10.5)

## 2014-10-15 MED ORDER — MORPHINE SULFATE 30 MG PO TABS: 30.0000 mg | ORAL_TABLET | ORAL | Status: DC

## 2014-10-15 NOTE — Discharge Summary (Signed)
Physician Discharge Summary  [name] ZOX:[id number] DOB: 03-17-1976 DOA: 10/12/2014  PCP: Dorrene German, MD  Admit date: 10/12/2014 Discharge date: 10/15/2014  Discharge Diagnoses:  Principal Problem:   Hemoglobin [place] with crisis Active Problems:   Leukocytosis   Sickle cell pain crisis   Discharge Condition: stable/improved  Disposition:  Follow-up Information    Follow up with Dorrene German, MD.   Specialty:  Internal Medicine   Why:  As needed, or as previously scheduled   Contact information:   [address] [place] [address] [phone]       Diet:regular  Wt Readings from Last 3 Encounters:  10/13/14 171 lb (77.565 kg)  06/04/14 173 lb 8 oz (78.699 kg)  05/07/14 169 lb (76.658 kg)    History of present illness:  The patient is a 39 y.o. 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 The patient was last at their baseline health until 3 days prior to admission. He states he developed some pain in the right lateral chest wall which is similar to his previous pain crisis, 8 out of 10 up to 10 out of 10 at its worst, worse with deep inspiration. He states he has some mild shortness of breath only when trying to take a deep breath, and has had a mild cough for the same reason. He denies sinus congestion, sore throat, fevers. He denies any other areas of pain. He normally takes MS Contin 60 mg by mouth twice a day which he continued to take. At baseline he does not take any immediate release morphine but for the last few days he has been taking MSIR 30 mg approximally twice a day without adequate relief. He was unaware he should take any NSAIDs. He did push fluids. He denies any obvious triggers such as infections,  exposure to heat or cold, dehydration. He has been compliant with his hydroxyurea and other medications.  Hospital Course:  Sickle Cell Crisis: Patient presented with pain characteristic of acute vaso-occlusive crisis .Pt's pain was treated with bolus IV analgesics initially and was later transitioned with a Dilaudid PCA that was adjusted to meet needs and ketorolac. He was continued on his home dose of MS Contin for long acting coverage.  His pain was well controlled PCA and his need decreased as pain improved. PCA was stopped and he was started on his home regimen of MSIR  PO. He had overall improvement of his pain and was physically functional upon discharge. His Hgb remained around his baseline during his stay and leukocytosis improved as his crisis resolved. Pt advised to go to ED if he develops SOB, CP, weakness, dizziness, or worsening pain.  Discharge Exam:  Filed Vitals:   10/15/14 0609  BP: 116/69  Pulse: 54  Temp: 97.9 F (36.6 C)  Resp: 9   Filed Vitals:   10/14/14 2321 10/15/14 0137 10/15/14 0326 10/15/14 0609  BP:  125/74  116/69  Pulse:  61  54  Temp:  97.3 F (36.3 C)  97.9 F (36.6 C)  TempSrc:  Axillary  Oral  Resp: Height:      Weight:      SpO2: 98% 99% 96% 96%     General: Alert, awake, oriented x3, in no acute distress.  HEENT: [place]/AT PEERL, EOMI Neck: Trachea midline,  no masses, no thyromegal,y no JVD, no carotid bruit OROPHARYNX:  Moist, No exudate/ erythema/lesions.  Heart: Regular rate and rhythm, without murmurs, rubs, gallops Lungs: Clear to auscultation, no wheezing or rhonchi noted. No increased vocal fremitus resonant to percussion  Abdomen: Soft, nontender, nondistended, positive bowel sounds, no masses no hepatosplenomegaly noted..  Neuro: No focal neurological deficits noted cranial nerves II through XII grossly intact. Strength 5 out of 5 in bilateral upper and lower extremities. Musculoskeletal: No warm swelling or erythema around  joints, no spinal tenderness noted. Psychiatric: Patient alert and oriented x3, good insight and cognition, good recent to remote recall. Lymph node survey: No cervical axillary or inguinal lymphadenopathy noted.   Discharge Instructions As above    Medication List    ASK your doctor about these medications        folic acid 1 MG tablet  Commonly known as:  FOLVITE  Take 1 mg by mouth daily.     hydroxyurea 500 MG capsule  Commonly known as:  HYDREA  Take 2 capsules (1,000 mg total) by mouth daily. May take with food to minimize GI side effects.     morphine 30 MG tablet  Commonly known as:  MSIR  Take 30 mg by mouth every 4 (four) hours as needed for severe pain (pain).     morphine 60 MG 12 hr tablet  Commonly known as:  MS CONTIN  Take 1 tablet (60 mg total) by mouth 2 (two) times daily.     promethazine 25 MG tablet  Commonly known as:  PHENERGAN  Take 25 mg by mouth every 6 (six) hours as needed for nausea (nausea).          The results of significant diagnostics from this hospitalization (including imaging, microbiology, ancillary and laboratory) are listed below for reference.    Significant Diagnostic Studies: Dg Chest 2 View  10/12/2014   CLINICAL DATA:  Sickle cell crisis. RIGHT lateral chest wall pain for 2-3 days.  EXAM: CHEST  2 VIEW  COMPARISON:  07/04/2014.  FINDINGS: The heart size and mediastinal contours are within normal limits. Both lungs are free of infiltrates. Slight BILATERAL pulmonary scarring, stable. Sclerosis of the LEFT humeral head, likely avascular necrosis. Subtle vertebral body endplate irregularities, consistent with sickle cell osteopathy.  IMPRESSION: No active cardiopulmonary disease.  Stable appearance from priors.   Electronically Signed   By: Elsie Stain M.D.   On: 10/12/2014 12:05    Microbiology: No results found for this or any previous visit (from the past 240 hour(s)).   Labs: Basic Metabolic Panel:  Recent Labs Lab  10/12/14 1135 10/13/14 0509 10/14/14 0355  NA 139 141 140  K 3.8 4.0 4.0  CL 105 105 102  CO2 GLUCOSE 91 89 91  BUN CREATININE 0.92 0.77 0.73  CALCIUM 9.4 8.9 9.2   Liver Function Tests:  Recent Labs Lab 10/12/14 1135 10/13/14 0509 10/14/14 0355  AST ALT ALKPHOS 82 72 77  BILITOT 2.3* 2.3* 2.1*  PROT 7.9 7.4 7.4  ALBUMIN 4.2 4.2 4.1   No results for input(s): LIPASE, AMYLASE in the last 168 hours. No results for input(s): AMMONIA in the last 168 hours. CBC:  Recent Labs Lab 10/12/14 1135 10/13/14 0509 10/14/14 0355 10/15/14 0420  WBC 12.8* 14.0* 10.8* 10.0  NEUTROABS 8.3* 9.2* 6.2 5.3  HGB 11.3* 10.1* 10.1*  10.2*  HCT 31.5* 27.7* 28.6* 29.1*  MCV 92.6 93.3 93.8 91.8  PLT 253 248 244 255   Cardiac Enzymes: No results for input(s): CKTOTAL, CKMB, CKMBINDEX, TROPONINI in the last 168 hours. BNP: Invalid input(s): POCBNP CBG: No results for input(s): GLUCAP in the last 168 hours. Ferritin: No results for input(s): FERRITIN in the last 168 hours.  Time coordinating discharge: >30 min  Signed:  Serina Cowper  10/15/2014, 9:56 AM

## 2014-10-15 NOTE — Plan of Care (Signed)
Problem: Phase I Progression Outcomes Goal: Bowel Movement At Least Every 3 Days Outcome: Not Progressing LBM 6/16 movantik given on 6/19, taking sch laxatives as well

## 2014-10-15 NOTE — Discharge Instructions (Signed)
Sickle Cell Anemia, Adult °Sickle cell anemia is a condition in which red blood cells have an abnormal "sickle" shape. This abnormal shape shortens the cells' life span, which results in a lower than normal concentration of red blood cells in the blood. The sickle shape also causes the cells to clump together and block free blood flow through the blood vessels. As a result, the tissues and organs of the body do not receive enough oxygen. Sickle cell anemia causes organ damage and pain and increases the risk of infection. °CAUSES  °Sickle cell anemia is a genetic disorder. Those who receive two copies of the gene have the condition, and those who receive one copy have the trait. °RISK FACTORS °The sickle cell gene is most common in people whose families originated in [place]. Other areas of the globe where sickle cell trait occurs include the [place], [place] and [place], the [place], and the [place].  °SIGNS AND SYMPTOMS °· Pain, especially in the extremities, back, chest, or abdomen (common). The pain may start suddenly or may develop following an illness, especially if there is dehydration. Pain can also occur due to overexertion or exposure to extreme temperature changes. °· Frequent severe bacterial infections, especially certain types of pneumonia and meningitis. °· Pain and swelling in the hands and feet. °· Decreased activity.   °· Loss of appetite.   °· Change in behavior. °· Headaches. °· Seizures. °· Shortness of breath or difficulty breathing. °· Vision changes. °· Skin ulcers. °Those with the trait may not have symptoms or they may have mild symptoms.  °DIAGNOSIS  °Sickle cell anemia is diagnosed with blood tests that demonstrate the genetic trait. It is often diagnosed during the newborn period, due to mandatory testing nationwide. A variety of blood tests, X-rays, CT scans, MRI scans, ultrasounds, and lung function tests may also be done to monitor the condition. °TREATMENT  °Sickle  cell anemia may be treated with: °· Medicines. You may be given pain medicines, antibiotic medicines (to treat and prevent infections) or medicines to increase the production of certain types of hemoglobin. °· Fluids. °· Oxygen. °· Blood transfusions. °HOME CARE INSTRUCTIONS  °· Drink enough fluid to keep your urine clear or pale yellow. Increase your fluid intake in hot weather and during exercise. °· Do not smoke. Smoking lowers oxygen levels in the blood.   °· Only take over-the-counter or prescription medicines for pain, fever, or discomfort as directed by your health care provider. °· Take antibiotics as directed by your health care provider. Make sure you finish them it even if you start to feel better.   °· Take supplements as directed by your health care provider.   °· Consider wearing a medical alert bracelet. This tells anyone caring for you in an emergency of your condition.   °· When traveling, keep your medical information, health care provider's names, and the medicines you take with you at all times.   °· If you develop a fever, do not take medicines to reduce the fever right away. This could cover up a problem that is developing. Notify your health care provider. °· Keep all follow-up appointments with your health care provider. Sickle cell anemia requires regular medical care. °SEEK MEDICAL CARE IF: ° You have a fever. °SEEK IMMEDIATE MEDICAL CARE IF:  °· You feel dizzy or faint.   °· You have new abdominal pain, especially on the left side near the stomach area.   °· You develop a persistent, often uncomfortable and painful penile erection (priapism). If this is not treated immediately it   will lead to impotence.   °· You have numbness your arms or legs or you have a hard time moving them.   °· You have a hard time with speech.   °· You have a fever or persistent symptoms for more than 2-3 days.   °· You have a fever and your symptoms suddenly get worse.   °· You have signs or symptoms of infection.  These include:   °· Chills.   °· Abnormal tiredness (lethargy).   °· Irritability.   °· Poor eating.   °· Vomiting.   °· You develop pain that is not helped with medicine.   °· You develop shortness of breath. °· You have pain in your chest.   °· You are coughing up pus-like or bloody sputum.   °· You develop a stiff neck. °· Your feet or hands swell or have pain. °· Your abdomen appears bloated. °· You develop joint pain. °MAKE SURE YOU: °· Understand these instructions. °Document Released: 07/22/2005 Document Revised: 08/28/2013 Document Reviewed: 11/23/2012 °ExitCare® Patient Information ©2015 [employer]. This information is not intended to replace advice given to you by your health care provider. Make sure you discuss any questions you have with your health care provider. ° °Anemia, Nonspecific °Anemia is a condition in which the concentration of red blood cells or hemoglobin in the blood is below normal. Hemoglobin is a substance in red blood cells that carries oxygen to the tissues of the body. Anemia results in not enough oxygen reaching these tissues.  °CAUSES  °Common causes of anemia include:  °· Excessive bleeding. Bleeding may be internal or external. This includes excessive bleeding from periods (in women) or from the intestine.   °· Poor nutrition.   °· Chronic kidney, thyroid, and liver disease.  °· Bone marrow disorders that decrease red blood cell production. °· Cancer and treatments for cancer. °· HIV, AIDS, and their treatments. °· Spleen problems that increase red blood cell destruction. °· Blood disorders. °· Excess destruction of red blood cells due to infection, medicines, and autoimmune disorders. °SIGNS AND SYMPTOMS  °· Minor weakness.   °· Dizziness.   °· Headache. °· Palpitations.   °· Shortness of breath, especially with exercise.   °· Paleness. °· Cold sensitivity. °· Indigestion. °· Nausea. °· Difficulty sleeping. °· Difficulty concentrating. °Symptoms may occur suddenly or they may  develop slowly.  °DIAGNOSIS  °Additional blood tests are often needed. These help your health care provider determine the best treatment. Your health care provider will check your stool for blood and look for other causes of blood loss.  °TREATMENT  °Treatment varies depending on the cause of the anemia. Treatment can include:  °· Supplements of iron, vitamin B12, or folic acid.   °· Hormone medicines.   °· A blood transfusion. This may be needed if blood loss is severe.   °· Hospitalization. This may be needed if there is significant continual blood loss.   °· Dietary changes. °· Spleen removal. °HOME CARE INSTRUCTIONS °Keep all follow-up appointments. It often takes many weeks to correct anemia, and having your health care provider check on your condition and your response to treatment is very important. °SEEK IMMEDIATE MEDICAL CARE IF:  °· You develop extreme weakness, shortness of breath, or chest pain.   °· You become dizzy or have trouble concentrating. °· You develop heavy vaginal bleeding.   °· You develop a rash.   °· You have bloody or black, tarry stools.   °· You faint.   °· You vomit up blood.   °· You vomit repeatedly.   °· You have abdominal pain. °· You have a fever or persistent symptoms for more than 2-3   days.   °· You have a fever and your symptoms suddenly get worse.   °· You are dehydrated.   °MAKE SURE YOU: °· Understand these instructions. °· Will watch your condition. °· Will get help right away if you are not doing well or get worse. °Document Released: 05/21/2004 Document Revised: 12/14/2012 Document Reviewed: 10/07/2012 °ExitCare® Patient Information ©2015 [employer]. This information is not intended to replace advice given to you by your health care provider. Make sure you discuss any questions you have with your health care provider. ° °

## 2015-01-29 ENCOUNTER — Emergency Department (HOSPITAL_COMMUNITY): Payer: Medicare Other

## 2015-01-29 ENCOUNTER — Encounter (HOSPITAL_COMMUNITY): Payer: Self-pay | Admitting: Emergency Medicine

## 2015-01-29 ENCOUNTER — Inpatient Hospital Stay (HOSPITAL_COMMUNITY)
Admission: EM | Admit: 2015-01-29 | Discharge: 2015-02-02 | DRG: 812 | Disposition: A | Discharge status: Home / Self Care | Payer: Medicare Other | Attending: Internal Medicine | Admitting: Internal Medicine

## 2015-01-29 DIAGNOSIS — R110 Nausea: Secondary | ICD-10-CM | POA: Diagnosis present

## 2015-01-29 DIAGNOSIS — M879 Osteonecrosis, unspecified: Secondary | ICD-10-CM | POA: Diagnosis present

## 2015-01-29 DIAGNOSIS — F17210 Nicotine dependence, cigarettes, uncomplicated: Secondary | ICD-10-CM | POA: Diagnosis present

## 2015-01-29 DIAGNOSIS — Z8701 Personal history of pneumonia (recurrent): Secondary | ICD-10-CM | POA: Diagnosis not present

## 2015-01-29 DIAGNOSIS — D5700 Hb-SS disease with crisis, unspecified: Secondary | ICD-10-CM | POA: Diagnosis present

## 2015-01-29 DIAGNOSIS — Z79899 Other long term (current) drug therapy: Secondary | ICD-10-CM | POA: Diagnosis not present

## 2015-01-29 DIAGNOSIS — D638 Anemia in other chronic diseases classified elsewhere: Secondary | ICD-10-CM | POA: Diagnosis present

## 2015-01-29 DIAGNOSIS — M25511 Pain in right shoulder: Secondary | ICD-10-CM | POA: Diagnosis present

## 2015-01-29 DIAGNOSIS — Z96641 Presence of right artificial hip joint: Secondary | ICD-10-CM | POA: Diagnosis present

## 2015-01-29 DIAGNOSIS — Z79891 Long term (current) use of opiate analgesic: Secondary | ICD-10-CM

## 2015-01-29 DIAGNOSIS — R079 Chest pain, unspecified: Secondary | ICD-10-CM

## 2015-01-29 LAB — COMPREHENSIVE METABOLIC PANEL
ALK PHOS: 78 U/L (ref 38–126)
ALT: 16 U/L — AB (ref 17–63)
AST: 25 U/L (ref 15–41)
Albumin: 4.7 g/dL (ref 3.5–5.0)
Anion gap: 7 (ref 5–15)
BUN: 10 mg/dL (ref 6–20)
CO2: 29 mmol/L (ref 22–32)
CREATININE: 0.67 mg/dL (ref 0.61–1.24)
Calcium: 9.5 mg/dL (ref 8.9–10.3)
Chloride: 105 mmol/L (ref 101–111)
GFR calc Af Amer: >60 mL/min (ref >60)
GLUCOSE: 72 mg/dL (ref 65–99)
Potassium: 3.6 mmol/L (ref 3.5–5.1)
Sodium: 141 mmol/L (ref 135–145)
Total Bilirubin: 2.8 mg/dL — ABNORMAL HIGH (ref 0.3–1.2)
Total Protein: 8.3 g/dL — ABNORMAL HIGH (ref 6.5–8.1)

## 2015-01-29 LAB — RETICULOCYTES
RBC.: 3.45 MIL/uL — ABNORMAL LOW (ref 4.22–5.81)
RETIC COUNT ABSOLUTE: 89.7 10*3/uL (ref 19.0–186.0)
Retic Ct Pct: 2.6 % (ref 0.4–3.1)

## 2015-01-29 LAB — CBC WITH DIFFERENTIAL/PLATELET
BASOS ABS: 0.1 10*3/uL (ref 0.0–0.1)
Basophils Relative: 1 %
EOS ABS: 0.5 10*3/uL (ref 0.0–0.7)
Eosinophils Relative: 5 %
HCT: 32.1 % — ABNORMAL LOW (ref 39.0–52.0)
Hemoglobin: 11.6 g/dL — ABNORMAL LOW (ref 13.0–17.0)
Lymphocytes Relative: 28 %
Lymphs Abs: 2.7 10*3/uL (ref 0.7–4.0)
MCH: 33.9 pg (ref 26.0–34.0)
MCHC: 36.1 g/dL — ABNORMAL HIGH (ref 30.0–36.0)
MCV: 93.9 fL (ref 78.0–100.0)
MONO ABS: 1.2 10*3/uL — AB (ref 0.1–1.0)
Monocytes Relative: 13 %
Neutro Abs: 5.2 10*3/uL (ref 1.7–7.7)
Neutrophils Relative %: 53 %
PLATELETS: 260 10*3/uL (ref 150–400)
RBC: 3.42 MIL/uL — AB (ref 4.22–5.81)
RDW: 14.5 % (ref 11.5–15.5)
WBC: 9.6 10*3/uL (ref 4.0–10.5)

## 2015-01-29 MED ORDER — SODIUM CHLORIDE 0.45 % IV SOLN
INTRAVENOUS | Status: DC
  Administered 2015-01-29: 1000 mL via INTRAVENOUS

## 2015-01-29 MED ORDER — HYDROMORPHONE HCL 2 MG/ML IJ SOLN: 0.0250 mg/kg | INTRAMUSCULAR | Status: AC

## 2015-01-29 MED ORDER — HYDROMORPHONE HCL 1 MG/ML IJ SOLN
1.0000 mg | Freq: Once | INTRAMUSCULAR | Status: AC
  Administered 2015-01-29: 1 mg via INTRAVENOUS
  Filled 2015-01-29: qty 1

## 2015-01-29 MED ORDER — HYDROMORPHONE HCL 1 MG/ML IJ SOLN
1.0000 mg | Freq: Once | INTRAMUSCULAR | Status: AC
  Administered 2015-01-29: 1 mg via INTRAVENOUS

## 2015-01-29 MED ORDER — ONDANSETRON HCL 4 MG/2ML IJ SOLN
4.0000 mg | INTRAMUSCULAR | Status: DC | PRN
  Administered 2015-01-29: 4 mg via INTRAVENOUS
  Filled 2015-01-29: qty 2

## 2015-01-29 MED ORDER — HYDROMORPHONE HCL 1 MG/ML IJ SOLN
2.0000 mg | Freq: Once | INTRAMUSCULAR | Status: DC
  Filled 2015-01-29: qty 2

## 2015-01-29 MED ORDER — HYDROMORPHONE HCL 2 MG/ML IJ SOLN
0.0250 mg/kg | INTRAMUSCULAR | Status: AC
  Administered 2015-01-29: 2 mg via INTRAVENOUS
  Filled 2015-01-29: qty 1

## 2015-01-29 MED ORDER — DIPHENHYDRAMINE HCL 50 MG/ML IJ SOLN
25.0000 mg | Freq: Once | INTRAMUSCULAR | Status: AC
  Administered 2015-01-29: 25 mg via INTRAVENOUS
  Filled 2015-01-29: qty 1

## 2015-01-29 NOTE — ED Notes (Signed)
Per pt, states sickle cell pain for 3-4 days-right side radiating to back

## 2015-01-29 NOTE — ED Notes (Signed)
RN sts he will get blood while putting in IV.

## 2015-01-29 NOTE — ED Notes (Signed)
Pt request blood draw during IV start

## 2015-01-29 NOTE — ED Provider Notes (Signed)
CSN: [id number]     Arrival date & time 01/29/15  1643 History   First MD Initiated Contact with Patient 01/29/15 1851     Chief Complaint  Patient presents with  . Sickle Cell Pain Crisis     (Consider location/radiation/quality/duration/timing/severity/associated sxs/prior Treatment) HPI Comments: Patient here complaining of pain to his back and shoulders for the past 3-4 days consistent with his prior sickle cell crisis. Denies any fever or chills. No cough or congestion. Has been using his home medication without relief. Denies any priapism. No neurological findings. Symptoms persistent nothing makes them better. Denies any urinary complaints.  Patient is a 39 y.o. male presenting with sickle cell pain. The history is provided by the patient.  Sickle Cell Pain Crisis   Past Medical History  Diagnosis Date  . Sickle cell crisis (HCC)   . Blood transfusion   . Avascular necrosis of hip (HCC)     bilateral  . Infection of bone, shoulder region (HCC)     left shoulder  . Pneumonia   . Avascular necrosis of hip, left (HCC) 08/27/2011   Past Surgical History  Procedure Laterality Date  . Orif right hip fracture  1995  . Joint replacement  2006    right total hip arthroplasty  . Bone graft hip iliac crest     Family History  Problem Relation Age of Onset  . Adopted: Yes   Social History  Substance Use Topics  . Smoking status: Current Some Day Smoker -- 0.50 packs/day    Types: Cigarettes    Last Attempt to Quit: 01/27/2012  . Smokeless tobacco: Never Used  . Alcohol Use: No    Review of Systems  All other systems reviewed and are negative.     Allergies  Review of patient's allergies indicates no known allergies.  Home Medications   Prior to Admission medications   Medication Sig Start Date End Date Taking? Authorizing Provider  folic acid (FOLVITE) 1 MG tablet Take 1 mg by mouth daily.    Historical Provider, MD  hydroxyurea (HYDREA) 500 MG capsule Take 2  capsules (1,000 mg total) by mouth daily. May take with food to minimize GI side effects. Patient taking differently: Take 500 mg by mouth 2 (two) times daily. May take with food to minimize GI side effects. 06/04/14   Rometta Emery, MD  morphine (MS CONTIN) 60 MG 12 hr tablet Take 1 tablet (60 mg total) by mouth 2 (two) times daily. Patient taking differently: Take 60 mg by mouth 2 (two) times daily as needed for pain.  11/20/11   Grayce Sessions, NP  morphine (MSIR) 30 MG tablet Take 30 mg by mouth every 4 (four) hours as needed for severe pain (pain).    Historical Provider, MD  promethazine (PHENERGAN) 25 MG tablet Take 25 mg by mouth every 6 (six) hours as needed for nausea (nausea).     Historical Provider, MD   BP 127/90 mmHg  Pulse 67  Temp(Src) 98.3 F (36.8 C) (Oral)  Resp 16  Wt 175 lb (79.379 kg)  SpO2 96% Physical Exam  Constitutional: He is oriented to person, place, and time. He appears well-developed and well-nourished.  Non-toxic appearance. No distress.  HENT:  Head: Normocephalic and atraumatic.  Eyes: Conjunctivae, EOM and lids are normal. Pupils are equal, round, and reactive to light.  Neck: Normal range of motion. Neck supple. No tracheal deviation present. No thyroid mass present.  Cardiovascular: Normal rate, regular rhythm and normal heart  sounds.  Exam reveals no gallop.   No murmur heard. Pulmonary/Chest: Effort normal and breath sounds normal. No stridor. No respiratory distress. He has no decreased breath sounds. He has no wheezes. He has no rhonchi. He has no rales.  Abdominal: Soft. Normal appearance and bowel sounds are normal. He exhibits no distension. There is no tenderness. There is no rebound and no CVA tenderness.  Musculoskeletal: Normal range of motion. He exhibits no edema or tenderness.  Neurological: He is alert and oriented to person, place, and time. He has normal strength. No cranial nerve deficit or sensory deficit. GCS eye subscore is 4.  GCS verbal subscore is 5. GCS motor subscore is 6.  Skin: Skin is warm and dry. No abrasion and no rash noted.  Psychiatric: He has a normal mood and affect. His speech is normal and behavior is normal.  Nursing note and vitals reviewed.   ED Course  Procedures (including critical care time) Labs Review Labs Reviewed  COMPREHENSIVE METABOLIC PANEL  CBC WITH DIFFERENTIAL/PLATELET  RETICULOCYTES    Imaging Review Dg Chest 2 View  01/29/2015   CLINICAL DATA:  Per pt, states sickle cell pain for 3-4 days-right side chest radiating to back  EXAM: CHEST  2 VIEW  COMPARISON:  10/12/2014  FINDINGS: Lungs are hyperinflated. Heart size is normal. Scarring identified in the right lung is stable. There are no focal consolidations or pleural effusions.  IMPRESSION: Stable appearance of the chest .   Electronically Signed   By: Norva Pavlov M.D.   On: 01/29/2015 17:19   I have personally reviewed and evaluated these images and lab results as part of my medical decision-making.   EKG Interpretation None      MDM   Final diagnoses:  None    Patient given 2 doses of IV pain medication here. Continues to still be uncomfortable and will be admitted to the medicine service    Lorre Nick, MD 01/29/15 2337

## 2015-01-29 NOTE — ED Notes (Signed)
Bed: ZO10 Expected date:  Expected time:  Means of arrival:  Comments: Hold for ssc

## 2015-01-30 ENCOUNTER — Inpatient Hospital Stay (HOSPITAL_COMMUNITY): Payer: Medicare Other

## 2015-01-30 ENCOUNTER — Other Ambulatory Visit (HOSPITAL_COMMUNITY): Payer: Medicare Other

## 2015-01-30 ENCOUNTER — Encounter (HOSPITAL_COMMUNITY): Payer: Self-pay | Admitting: Internal Medicine

## 2015-01-30 DIAGNOSIS — D5700 Hb-SS disease with crisis, unspecified: Principal | ICD-10-CM

## 2015-01-30 LAB — COMPREHENSIVE METABOLIC PANEL
ALT: 14 U/L — ABNORMAL LOW (ref 17–63)
ANION GAP: 8 (ref 5–15)
AST: 23 U/L (ref 15–41)
Albumin: 4.1 g/dL (ref 3.5–5.0)
Alkaline Phosphatase: 68 U/L (ref 38–126)
BILIRUBIN TOTAL: 2.8 mg/dL — AB (ref 0.3–1.2)
BUN: 10 mg/dL (ref 6–20)
CO2: 27 mmol/L (ref 22–32)
Calcium: 9 mg/dL (ref 8.9–10.3)
Chloride: 103 mmol/L (ref 101–111)
Creatinine, Ser: 0.67 mg/dL (ref 0.61–1.24)
Glucose, Bld: 93 mg/dL (ref 65–99)
POTASSIUM: 3.5 mmol/L (ref 3.5–5.1)
Sodium: 138 mmol/L (ref 135–145)
TOTAL PROTEIN: 7.4 g/dL (ref 6.5–8.1)

## 2015-01-30 LAB — CBC WITH DIFFERENTIAL/PLATELET
BASOS ABS: 0.1 10*3/uL (ref 0.0–0.1)
BASOS PCT: 1 %
EOS PCT: 6 %
Eosinophils Absolute: 0.6 10*3/uL (ref 0.0–0.7)
HEMATOCRIT: 30.2 % — AB (ref 39.0–52.0)
Hemoglobin: 10.9 g/dL — ABNORMAL LOW (ref 13.0–17.0)
Lymphocytes Relative: 37 %
Lymphs Abs: 3.4 10*3/uL (ref 0.7–4.0)
MCH: 34.2 pg — ABNORMAL HIGH (ref 26.0–34.0)
MCHC: 36.1 g/dL — ABNORMAL HIGH (ref 30.0–36.0)
MCV: 94.7 fL (ref 78.0–100.0)
MONO ABS: 0.9 10*3/uL (ref 0.1–1.0)
Monocytes Relative: 9 %
NEUTROS ABS: 4.3 10*3/uL (ref 1.7–7.7)
Neutrophils Relative %: 47 %
PLATELETS: 254 10*3/uL (ref 150–400)
RBC: 3.19 MIL/uL — ABNORMAL LOW (ref 4.22–5.81)
RDW: 14 % (ref 11.5–15.5)
WBC: 9.2 10*3/uL (ref 4.0–10.5)

## 2015-01-30 LAB — D-DIMER, QUANTITATIVE: D-Dimer, Quant: 5.51 ug/mL-FEU — ABNORMAL HIGH (ref 0.00–0.48)

## 2015-01-30 LAB — LACTATE DEHYDROGENASE: LDH: 148 U/L (ref 98–192)

## 2015-01-30 MED ORDER — PROMETHAZINE HCL 25 MG PO TABS: 25.0000 mg | ORAL_TABLET | Freq: Four times a day (QID) | ORAL | Status: DC | PRN

## 2015-01-30 MED ORDER — SODIUM CHLORIDE 0.45 % IV SOLN
INTRAVENOUS | Status: AC
  Administered 2015-01-30 – 2015-01-31 (×2): via INTRAVENOUS

## 2015-01-30 MED ORDER — SODIUM CHLORIDE 0.45 % IV SOLN: INTRAVENOUS | Status: DC

## 2015-01-30 MED ORDER — INFLUENZA VAC SPLIT QUAD 0.5 ML IM SUSY
0.5000 mL | PREFILLED_SYRINGE | INTRAMUSCULAR | Status: DC
  Filled 2015-01-30 (×2): qty 0.5

## 2015-01-30 MED ORDER — ONDANSETRON HCL 4 MG/2ML IJ SOLN
4.0000 mg | Freq: Four times a day (QID) | INTRAMUSCULAR | Status: DC | PRN
  Administered 2015-01-30: 4 mg via INTRAVENOUS
  Filled 2015-01-30: qty 2

## 2015-01-30 MED ORDER — ONDANSETRON HCL 4 MG/2ML IJ SOLN
4.0000 mg | INTRAMUSCULAR | Status: DC | PRN
  Administered 2015-01-30 – 2015-01-31 (×4): 4 mg via INTRAVENOUS
  Filled 2015-01-30 (×5): qty 2

## 2015-01-30 MED ORDER — ENOXAPARIN SODIUM 40 MG/0.4ML ~~LOC~~ SOLN
40.0000 mg | SUBCUTANEOUS | Status: DC
  Administered 2015-01-30 – 2015-02-01 (×3): 40 mg via SUBCUTANEOUS
  Filled 2015-01-30 (×4): qty 0.4

## 2015-01-30 MED ORDER — HYDROMORPHONE HCL 1 MG/ML IJ SOLN
1.0000 mg | INTRAMUSCULAR | Status: DC | PRN
  Administered 2015-01-30: 1 mg via INTRAVENOUS
  Filled 2015-01-30: qty 1

## 2015-01-30 MED ORDER — SODIUM CHLORIDE 0.9 % IJ SOLN: 9.0000 mL | INTRAMUSCULAR | Status: DC | PRN

## 2015-01-30 MED ORDER — DIPHENHYDRAMINE HCL 25 MG PO CAPS: 25.0000 mg | ORAL_CAPSULE | Freq: Four times a day (QID) | ORAL | Status: DC | PRN

## 2015-01-30 MED ORDER — NALOXONE HCL 0.4 MG/ML IJ SOLN: 0.4000 mg | INTRAMUSCULAR | Status: DC | PRN

## 2015-01-30 MED ORDER — POLYETHYLENE GLYCOL 3350 17 G PO PACK: 17.0000 g | PACK | Freq: Every day | ORAL | Status: DC | PRN

## 2015-01-30 MED ORDER — SENNOSIDES-DOCUSATE SODIUM 8.6-50 MG PO TABS
1.0000 | ORAL_TABLET | Freq: Two times a day (BID) | ORAL | Status: DC
  Administered 2015-01-30 – 2015-02-02 (×8): 1 via ORAL
  Filled 2015-01-30 (×8): qty 1

## 2015-01-30 MED ORDER — LACTULOSE 10 GM/15ML PO SOLN
20.0000 g | Freq: Every day | ORAL | Status: DC | PRN
  Administered 2015-01-31: 20 g via ORAL
  Filled 2015-01-30: qty 30

## 2015-01-30 MED ORDER — HYDROMORPHONE 2 MG/ML HIGH CONCENTRATION IV PCA SOLN
INTRAVENOUS | Status: DC
  Administered 2015-01-30: 0 mg via INTRAVENOUS
  Administered 2015-01-30: 5.6 mg via INTRAVENOUS
  Administered 2015-01-30: 8.8 mg via INTRAVENOUS
  Administered 2015-01-31: 9.1 mg via INTRAVENOUS
  Administered 2015-01-31: 5.6 mg via INTRAVENOUS
  Administered 2015-01-31: 4.3 mg via INTRAVENOUS
  Administered 2015-01-31: 4.2 mg via INTRAVENOUS
  Administered 2015-01-31: 7 mg via INTRAVENOUS
  Administered 2015-01-31: 3.5 mg via INTRAVENOUS
  Administered 2015-02-01: 7 mg via INTRAVENOUS
  Administered 2015-02-01: 5.6 mg via INTRAVENOUS
  Administered 2015-02-01 (×2): 6.3 mg via INTRAVENOUS
  Administered 2015-02-01: 5.6 mg via INTRAVENOUS
  Administered 2015-02-01: 3.5 mg via INTRAVENOUS
  Administered 2015-02-02: 9.1 mg via INTRAVENOUS
  Administered 2015-02-02: 3.5 mg via INTRAVENOUS
  Administered 2015-02-02: 1.4 mg via INTRAVENOUS
  Administered 2015-02-02: 3.5 mg via INTRAVENOUS
  Filled 2015-01-30 (×3): qty 25

## 2015-01-30 MED ORDER — IOHEXOL 350 MG/ML SOLN
100.0000 mL | Freq: Once | INTRAVENOUS | Status: AC | PRN
  Administered 2015-01-30: 100 mL via INTRAVENOUS

## 2015-01-30 MED ORDER — MORPHINE SULFATE ER 30 MG PO TBCR
60.0000 mg | EXTENDED_RELEASE_TABLET | Freq: Two times a day (BID) | ORAL | Status: DC
  Administered 2015-01-30 – 2015-02-02 (×8): 60 mg via ORAL
  Filled 2015-01-30 (×8): qty 2

## 2015-01-30 MED ORDER — HYDROMORPHONE 2 MG/ML HIGH CONCENTRATION IV PCA SOLN
INTRAVENOUS | Status: DC
  Administered 2015-01-30: 03:00:00 via INTRAVENOUS
  Administered 2015-01-30: 2.2 mg via INTRAVENOUS
  Administered 2015-01-30: 6.84 mg via INTRAVENOUS

## 2015-01-30 MED ORDER — SODIUM CHLORIDE 0.9 % IV SOLN
25.0000 mg | INTRAVENOUS | Status: DC | PRN
  Filled 2015-01-30: qty 0.5

## 2015-01-30 MED ORDER — FOLIC ACID 1 MG PO TABS
1.0000 mg | ORAL_TABLET | Freq: Every day | ORAL | Status: DC
  Administered 2015-01-30 – 2015-02-02 (×4): 1 mg via ORAL
  Filled 2015-01-30 (×4): qty 1

## 2015-01-30 NOTE — H&P (Signed)
[facility] History and Physical  ZEV BLUE GNF:[id number] DOB: 1975/08/23 DOA: 01/29/2015  Referring physician: Dr. Freida Busman. PCP: Dorrene German, MD  Specialists: None.  Chief Complaint: Right-sided shoulder pain and chest pain.  HPI: IMRI LOR is a 39 y.o. male with history of sickle cell anemia presents to the ER because of right-sided shoulder pain and chest pain. Patient has been having these symptoms over the last 48 hours. Pain is mostly on deep inspiration. Denies any shortness of breath or productive cough. Patient feels the pain is typical of his sickle cell crisis. Lab works showed normal retic count and baseline hemoglobin. But since patient has having persistent pain patient has been admitted for further pain management. Patient is not hypoxic and is afebrile. Chest x-ray does not show any infiltrates. Patient does not have any localized tenderness.   Review of Systems: As presented in the history of presenting illness, rest negative.  Past Medical History  Diagnosis Date  . Sickle cell crisis (HCC)   . Blood transfusion   . Avascular necrosis of hip (HCC)     bilateral  . Infection of bone, shoulder region (HCC)     left shoulder  . Pneumonia   . Avascular necrosis of hip, left (HCC) 08/27/2011   Past Surgical History  Procedure Laterality Date  . Orif right hip fracture  1995  . Joint replacement  2006    right total hip arthroplasty  . Bone graft hip iliac crest     Social History:  reports that he has been smoking Cigarettes.  He has been smoking about 0.50 packs per day. He has never used smokeless tobacco. He reports that he does not drink alcohol or use illicit drugs. Where does patient live home. Can patient participate in ADLs? Yes.  No Known Allergies  Family History:  Family History  Problem Relation Age of Onset  . Adopted: Yes      Prior to Admission medications   Medication Sig Start Date End Date Taking? Authorizing Provider   folic acid (FOLVITE) 1 MG tablet Take 1 mg by mouth daily.   Yes Historical Provider, MD  morphine (MS CONTIN) 60 MG 12 hr tablet Take 1 tablet (60 mg total) by mouth 2 (two) times daily. Patient taking differently: Take 60 mg by mouth 2 (two) times daily as needed for pain.  11/20/11  Yes Grayce Sessions, NP  morphine (MSIR) 30 MG tablet Take 30 mg by mouth every 8 (eight) hours as needed for severe pain (pain).    Yes Historical Provider, MD  promethazine (PHENERGAN) 25 MG tablet Take 25 mg by mouth every 6 (six) hours as needed for nausea (nausea).    Yes Historical Provider, MD  hydroxyurea (HYDREA) 500 MG capsule Take 2 capsules (1,000 mg total) by mouth daily. May take with food to minimize GI side effects. Patient taking differently: Take 500 mg by mouth daily. May take with food to minimize GI side effects. 06/04/14   Rometta Emery, MD    Physical Exam: Filed Vitals:   01/29/15 2100 01/29/15 2258 01/30/15 0011 01/30/15 0053  BP: 121/81 127/70 111/74 116/86  Pulse: 57 55 65 65  Temp:    97.6 F (36.4 C)  TempSrc:    Oral  Resp: Height:     (1.88 m)  Weight:    76.522 kg (168 lb 11.2 oz)  SpO2: 92% 96% 95% 97%     General:  Moderately built  and nourished.  Eyes: Anicteric mild pallor.  ENT: No discharge from the ears eyes nose and mouth.  Neck: No mass felt. No JVD appreciated.  Cardiovascular: S1 and S2 heard.  Respiratory: No rhonchi or crepitations.  Abdomen: Soft nontender bowel sounds present.  Skin: No rash.  Musculoskeletal: No edema.  Psychiatric: Appears normal.  Neurologic: Alert awake oriented to time place and person. Moves all extremities.  Labs on Admission:  Basic Metabolic Panel:  Recent Labs Lab 01/29/15 2052  NA 141  K 3.6  CL 105  CO2 29  GLUCOSE 72  BUN 10  CREATININE 0.67  CALCIUM 9.5   Liver Function Tests:  Recent Labs Lab 01/29/15 2052  AST 25  ALT 16*  ALKPHOS 78  BILITOT 2.8*  PROT 8.3*   ALBUMIN 4.7   No results for input(s): LIPASE, AMYLASE in the last 168 hours. No results for input(s): AMMONIA in the last 168 hours. CBC:  Recent Labs Lab 01/29/15 2052  WBC 9.6  NEUTROABS 5.2  HGB 11.6*  HCT 32.1*  MCV 93.9  PLT 260   Cardiac Enzymes: No results for input(s): CKTOTAL, CKMB, CKMBINDEX, TROPONINI in the last 168 hours.  BNP (last 3 results) No results for input(s): BNP in the last 8760 hours.  ProBNP (last 3 results) No results for input(s): PROBNP in the last 8760 hours.  CBG: No results for input(s): GLUCAP in the last 168 hours.  Radiological Exams on Admission: Dg Chest 2 View  01/29/2015   CLINICAL DATA:  Per pt, states sickle cell pain for 3-4 days-right side chest radiating to back  EXAM: CHEST  2 VIEW  COMPARISON:  10/12/2014  FINDINGS: Lungs are hyperinflated. Heart size is normal. Scarring identified in the right lung is stable. There are no focal consolidations or pleural effusions.  IMPRESSION: Stable appearance of the chest .   Electronically Signed   By: Norva Pavlov M.D.   On: 01/29/2015 17:19    Assessment/Plan Principal Problem:   Sickle cell crisis (HCC) Active Problems:   Anemia   Sickle cell pain crisis (HCC)   1. Right-sided pleuritic chest pain with right shoulder pain - concerning for sickle cell pain crisis but since patient has pleuritic type of chest pain we will check d-dimer. If d-dimer is positive will get CT angiogram to rule out PE. Patient presently has been placed on weight-based Dilaudid PCA. Check LDH. Gentle hydration. Continue patient's long-acting pain relief medications. Patient presently is afebrile and not hypoxic. 2. Sickle cell anemia - follow CBC.  I have reviewed patient's old charts on labs. Personally reviewed chest x-ray.   DVT Prophylaxis Lovenox.  Code Status: Full code.  Family Communication: Discussed with patient.  Disposition Plan: Admit to inpatient.    Gionni Vaca N. [facility] Pager [phone].  If 7PM-7AM, please contact night-coverage [email or web address] Password [email or web address] 01/30/2015, 2:43 AM

## 2015-01-30 NOTE — Progress Notes (Signed)
SICKLE CELL SERVICE PROGRESS NOTE  [name]:[id number] DOB: 02-08-1976 DOA: 01/29/2015 PCP: Dorrene German, MD  Assessment/Plan: Principal Problem:   Sickle cell crisis (HCC) Active Problems:   Anemia   Sickle cell pain crisis (HCC)  1. Hb [place] with crisis: Opiate tolerant patient with Hb [place] admitted with crisis. Pain is localized to right ribcage and right shoulder at an in tensity of 7-8/10. HE has used 11.4 mg with 14/13:demands./deliveries in the last 6 hours. I will continue PCA at custom dose, continue MS Contin and Toradol. Encourage increased use of the PCA. Will re-assess pain in the morning.  2. Anemia; Pt has a mild anemia with baseline Hb at 10. Currently at baseline Hb.  Code Status: Full Code Family Communication: N/A Disposition Plan: Not yet ready for discharge  Ahmya Bernick A.  Pager [phone]. If 7PM-7AM, please contact night-coverage.  01/30/2015, 2:41 PM  LOS: 1 day    Consultants:  None     Procedures:  None  Antibiotics:  None   Objective: Filed Vitals:   01/30/15 0845 01/30/15 1052 01/30/15 1239 01/30/15 1437  BP:  116/72  121/58  Pulse:  54  65  Temp:  98.2 F (36.8 C)  97.3 F (36.3 C)  TempSrc:  Oral  Oral  Resp: Height:      Weight:      SpO2: 94% 94% 95% 97%   Weight change:   Intake/Output Summary (Last 24 hours) at 01/30/15 1441 Last data filed at 01/30/15 0949  Gross per 24 hour  Intake    450 ml  Output    350 ml  Net    100 ml    General: Alert, awake, oriented x3, in mild distress.  HEENT: [place]/AT PEERL, EOMI, anicteric Neck: Trachea midline,  no masses, no thyromegal,y no JVD, no carotid bruit OROPHARYNX:  Moist, No exudate/ erythema/lesions.  Heart: Regular rate and rhythm, without murmurs, rubs, gallops, PMI non-displaced, no heaves or thrills on palpation.  Lungs: Clear to auscultation, no wheezing or rhonchi noted. No increased vocal fremitus resonant to percussion  Abdomen: Soft,  nontender, nondistended, positive bowel sounds, no masses no hepatosplenomegaly noted.  Neuro: No focal neurological deficits noted cranial nerves II through XII grossly intact.  Strength at functional baseline in bilateral upper and lower extremities. Musculoskeletal: No warm swelling or erythema around joints, no spinal tenderness noted. Psychiatric: Patient alert and oriented x3, good insight and cognition, good recent to remote recall.    Data Reviewed: Basic Metabolic Panel:  Recent Labs Lab 01/29/15 2052 01/30/15 0420  NA 141 138  K 3.6 3.5  CL 105 103  CO2 29 27  GLUCOSE 72 93  BUN 10 10  CREATININE 0.67 0.67  CALCIUM 9.5 9.0   Liver Function Tests:  Recent Labs Lab 01/29/15 2052 01/30/15 0420  AST 25 23  ALT 16* 14*  ALKPHOS 78 68  BILITOT 2.8* 2.8*  PROT 8.3* 7.4  ALBUMIN 4.7 4.1   No results for input(s): LIPASE, AMYLASE in the last 168 hours. No results for input(s): AMMONIA in the last 168 hours. CBC:  Recent Labs Lab 01/29/15 2052 01/30/15 0420  WBC 9.6 9.2  NEUTROABS 5.2 4.3  HGB 11.6* 10.9*  HCT 32.1* 30.2*  MCV 93.9 94.7  PLT 260 254   Cardiac Enzymes: No results for input(s): CKTOTAL, CKMB, CKMBINDEX, TROPONINI in the last 168 hours. BNP (last 3 results) No results for input(s): BNP in the last 8760 hours.  ProBNP (  last 3 results) No results for input(s): PROBNP in the last 8760 hours.  CBG: No results for input(s): GLUCAP in the last 168 hours.  No results found for this or any previous visit (from the past 240 hour(s)).   Studies: Dg Chest 2 View  01/29/2015   CLINICAL DATA:  Per pt, states sickle cell pain for 3-4 days-right side chest radiating to back  EXAM: CHEST  2 VIEW  COMPARISON:  10/12/2014  FINDINGS: Lungs are hyperinflated. Heart size is normal. Scarring identified in the right lung is stable. There are no focal consolidations or pleural effusions.  IMPRESSION: Stable appearance of the chest .   Electronically Signed    By: Norva Pavlov M.D.   On: 01/29/2015 17:19   Ct Angio Chest Pe W/cm &/or Wo Cm  01/30/2015   CLINICAL DATA:  Chest pain.  EXAM: CT ANGIOGRAPHY CHEST WITH CONTRAST  TECHNIQUE: Multidetector CT imaging of the chest was performed using the standard protocol during bolus administration of intravenous contrast. Multiplanar CT image reconstructions and MIPs were obtained to evaluate the vascular anatomy.  CONTRAST:  OMNIPAQUE IOHEXOL 350 MG/ML SOLN  COMPARISON:  CT scan of April 28, 2010.  FINDINGS: Sclerotic densities are noted throughout the spine consistent with a history of sickle cell disease. No pneumothorax or pleural effusion is noted. Stable subpleural blebs are noted in the right lower lobe. Stable scarring is noted in both lung bases. No acute pulmonary disease is noted. There is no evidence of thoracic aortic dissection or aneurysm. There is no evidence of pulmonary embolus. No significant mediastinal mass or adenopathy is noted. Visualized portion of upper abdomen is unremarkable.  Review of the MIP images confirms the above findings.  IMPRESSION: No evidence of pulmonary embolus.  Stable subpleural blebs are noted in right lung base with scarring seen in both lung bases.  No acute abnormality is noted in the chest.   Electronically Signed   By: Lupita Raider, M.D.   On: 01/30/2015 08:55    Scheduled Meds: . enoxaparin (LOVENOX) injection  40 mg Subcutaneous Q24H  . folic acid  1 mg Oral Daily  . HYDROmorphone PCA 2 mg/mL   Intravenous 6 times per day  . [START ON 01/31/2015] Influenza vac split quadrivalent PF  0.5 mL Intramuscular Tomorrow-1000  . morphine  60 mg Oral BID  . senna-docusate  1 tablet Oral BID   Continuous Infusions: . sodium chloride 100 mL/hr at 01/30/15 0700    Total time 25 minutes.

## 2015-01-30 NOTE — ED Notes (Signed)
Attempt made to call report. Nurse unavailable to take report and will call back.

## 2015-01-31 DIAGNOSIS — R110 Nausea: Secondary | ICD-10-CM

## 2015-01-31 MED ORDER — PROMETHAZINE HCL 25 MG PO TABS
25.0000 mg | ORAL_TABLET | Freq: Four times a day (QID) | ORAL | Status: DC | PRN
  Administered 2015-01-31 – 2015-02-01 (×3): 25 mg via ORAL
  Filled 2015-01-31 (×3): qty 1

## 2015-01-31 MED ORDER — DEXTROSE-NACL 5-0.45 % IV SOLN
INTRAVENOUS | Status: DC
  Administered 2015-01-31 – 2015-02-01 (×2): via INTRAVENOUS

## 2015-01-31 MED ORDER — KETOROLAC TROMETHAMINE 30 MG/ML IJ SOLN
30.0000 mg | Freq: Four times a day (QID) | INTRAMUSCULAR | Status: DC
  Administered 2015-01-31 – 2015-02-02 (×8): 30 mg via INTRAVENOUS
  Filled 2015-01-31 (×8): qty 1

## 2015-01-31 NOTE — Care Management Important Message (Signed)
Important Message  Patient Details  Name: DELAND SLOCUMB MRN: [id number] Date of Birth: 07-01-1975   Medicare Important Message Given:  Yes-second notification given    Haskell Flirt 01/31/2015, 11:26 AMImportant Message  Patient Details  Name: JARI DIPASQUALE MRN: [id number] Date of Birth: Jul 16, 1975   Medicare Important Message Given:  Yes-second notification given    Haskell Flirt 01/31/2015, 11:26 AM

## 2015-01-31 NOTE — Progress Notes (Signed)
SICKLE CELL SERVICE PROGRESS NOTE  [name] UJW:[id number] DOB: [birth year] DOA: 01/29/2015 PCP: Dorrene German, MD  Assessment/Plan: Principal Problem:   Sickle cell crisis (HCC) Active Problems:   Anemia   Sickle cell pain crisis (HCC)  1. 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 Pain is localized to right ribcage and right shoulder at an intensity of 7-8/10. HE has used 25.9 mg with 44/37:demands./deliveries in the last 6 hours. I will continue PCA at custom dose, continue MS Contin and Toradol. Encourage increased use of the PCA. Will re-assess pain in the morning.  2. Nausea: Pt has been having significant nausea not well controlled with Zofran thus not eating or drinking well. Will continue IVF until patient tolerating oral intake better. 3. Anemia; Pt has a mild anemia with baseline Hb at 10. Currently at baseline Hb.  Code Status: Full Code Family Communication: N/A Disposition Plan: Not yet ready for discharge  [name],[name]  Pager [phone]. If 7PM-7AM, please contact night-coverage.  01/31/2015, 12:39 PM  LOS: 2 days    Consultants:  None     Procedures:  None  Antibiotics:  None  Subjective Pt states that pain still at an intensity of 7/10. He is still having nausea and poor oral intake. He takes Phenergan at home.   Objective: Filed Vitals:   01/31/15 0350 01/31/15 0621 01/31/15 0829 01/31/15 1144  BP:  109/72    Pulse:  58    Temp:  98.9 F (37.2 C)    TempSrc:  Oral    Resp: Height:      Weight:  168 lb 6.9 oz (76.4 kg)    SpO2: 96% 96% 97% 97%   Weight change: -6 lb 9.1 oz (-2.98 kg)  Intake/Output Summary (Last 24 hours) at 01/31/15 1239 Last data filed at 01/31/15 0800  Gross per 24 hour  Intake    840 ml  Output   2625 ml  Net  -1785 ml    General: Alert, awake, oriented x3, in mild distress.  HEENT: [place]/AT PEERL, EOMI, anicteric OROPHARYNX:  Moist, No exudate/  erythema/lesions.  Heart: Regular rate and rhythm, without murmurs, rubs, gallops.  Lungs: Clear to auscultation, no wheezing or rhonchi noted. No increased vocal fremitus resonant to percussion  Abdomen: Soft, nontender, nondistended, positive bowel sounds, no masses no hepatosplenomegaly noted.  Neuro: No focal neurological deficits noted cranial nerves II through XII grossly intact.  Strength at functional baseline in bilateral upper and lower extremities. Musculoskeletal: No warm swelling or erythema around joints, no spinal tenderness noted. Psychiatric: Patient alert and oriented x3, good insight and cognition, good recent to remote recall.    Data Reviewed: Basic Metabolic Panel:  Recent Labs Lab 01/29/15 2052 01/30/15 0420  NA 141 138  K 3.6 3.5  CL 105 103  CO2 29 27  GLUCOSE 72 93  BUN 10 10  CREATININE 0.67 0.67  CALCIUM 9.5 9.0   Liver Function Tests:  Recent Labs Lab 01/29/15 2052 01/30/15 0420  AST 25 23  ALT 16* 14*  ALKPHOS 78 68  BILITOT 2.8* 2.8*  PROT 8.3* 7.4  ALBUMIN 4.7 4.1   No results for input(s): LIPASE, AMYLASE in the last 168 hours. No results for input(s): AMMONIA in the last 168 hours. CBC:  Recent Labs Lab 01/29/15 2052 01/30/15 0420  WBC 9.6 9.2  NEUTROABS 5.2 4.3  HGB 11.6* 10.9*  HCT 32.1* 30.2*  MCV 93.9 94.7  PLT 260 254   Cardiac Enzymes: No results for input(s): CKTOTAL, CKMB, CKMBINDEX, TROPONINI in the last 168 hours. BNP (last 3 results) No results for input(s): BNP in the last 8760 hours.  ProBNP (last 3 results) No results for input(s): PROBNP in the last 8760 hours.  CBG: No results for input(s): GLUCAP in the last 168 hours.  No results found for this or any previous visit (from the past 240 hour(s)).   Studies: Dg Chest 2 View  01/29/2015   CLINICAL DATA:  Per pt, states sickle cell pain for 3-4 days-right side chest radiating to back  EXAM: CHEST  2 VIEW  COMPARISON:  10/12/2014  FINDINGS: Lungs are  hyperinflated. Heart size is normal. Scarring identified in the right lung is stable. There are no focal consolidations or pleural effusions.  IMPRESSION: Stable appearance of the chest .   Electronically Signed   By: Norva Pavlov M.D.   On: 01/29/2015 17:19   Ct Angio Chest Pe W/cm &/or Wo Cm  01/30/2015   CLINICAL DATA:  Chest pain.  EXAM: CT ANGIOGRAPHY CHEST WITH CONTRAST  TECHNIQUE: Multidetector CT imaging of the chest was performed using the standard protocol during bolus administration of intravenous contrast. Multiplanar CT image reconstructions and MIPs were obtained to evaluate the vascular anatomy.  CONTRAST:  OMNIPAQUE IOHEXOL 350 MG/ML SOLN  COMPARISON:  CT scan of April 28, 2010.  FINDINGS: Sclerotic densities are noted throughout the spine consistent with a history of sickle cell disease. No pneumothorax or pleural effusion is noted. Stable subpleural blebs are noted in the right lower lobe. Stable scarring is noted in both lung bases. No acute pulmonary disease is noted. There is no evidence of thoracic aortic dissection or aneurysm. There is no evidence of pulmonary embolus. No significant mediastinal mass or adenopathy is noted. Visualized portion of upper abdomen is unremarkable.  Review of the MIP images confirms the above findings.  IMPRESSION: No evidence of pulmonary embolus.  Stable subpleural blebs are noted in right lung base with scarring seen in both lung bases.  No acute abnormality is noted in the chest.   Electronically Signed   By: Lupita Raider, M.D.   On: 01/30/2015 08:55    Scheduled Meds: . enoxaparin (LOVENOX) injection  40 mg Subcutaneous Q24H  . folic acid  1 mg Oral Daily  . HYDROmorphone PCA 2 mg/mL   Intravenous 6 times per day  . Influenza vac split quadrivalent PF  0.5 mL Intramuscular Tomorrow-1000  . ketorolac  30 mg Intravenous 4 times per day  . morphine  60 mg Oral BID  . senna-docusate  1 tablet Oral BID   Continuous Infusions:     Total time 25 minutes.

## 2015-02-01 LAB — CBC WITH DIFFERENTIAL/PLATELET
BASOS ABS: 0.1 10*3/uL (ref 0.0–0.1)
BASOS PCT: 1 %
EOS ABS: 0.8 10*3/uL — AB (ref 0.0–0.7)
Eosinophils Relative: 7 %
HEMATOCRIT: 30.3 % — AB (ref 39.0–52.0)
Hemoglobin: 11 g/dL — ABNORMAL LOW (ref 13.0–17.0)
LYMPHS ABS: 2.1 10*3/uL (ref 0.7–4.0)
Lymphocytes Relative: 20 %
MCH: 34.2 pg — ABNORMAL HIGH (ref 26.0–34.0)
MCHC: 36.3 g/dL — ABNORMAL HIGH (ref 30.0–36.0)
MCV: 94.1 fL (ref 78.0–100.0)
MONO ABS: 1.1 10*3/uL — AB (ref 0.1–1.0)
MONOS PCT: 11 %
NEUTROS PCT: 61 %
Neutro Abs: 6.4 10*3/uL (ref 1.7–7.7)
PLATELETS: 240 10*3/uL (ref 150–400)
RBC: 3.22 MIL/uL — AB (ref 4.22–5.81)
RDW: 14.7 % (ref 11.5–15.5)
WBC: 10.4 10*3/uL (ref 4.0–10.5)

## 2015-02-01 LAB — RETICULOCYTES
RBC.: 3.22 MIL/uL — AB (ref 4.22–5.81)
RETIC COUNT ABSOLUTE: 103 10*3/uL (ref 19.0–186.0)
Retic Ct Pct: 3.2 % — ABNORMAL HIGH (ref 0.4–3.1)

## 2015-02-01 LAB — BASIC METABOLIC PANEL
ANION GAP: 4 — AB (ref 5–15)
BUN: 7 mg/dL (ref 6–20)
CALCIUM: 9.3 mg/dL (ref 8.9–10.3)
CO2: 28 mmol/L (ref 22–32)
Chloride: 104 mmol/L (ref 101–111)
Creatinine, Ser: 0.65 mg/dL (ref 0.61–1.24)
GLUCOSE: 90 mg/dL (ref 65–99)
POTASSIUM: 4.1 mmol/L (ref 3.5–5.1)
Sodium: 136 mmol/L (ref 135–145)

## 2015-02-01 NOTE — Care Management Note (Signed)
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                Action/Plan: From home with parent  Expected Discharge Date:                  Expected Discharge Plan:  Home/Self Care  In-House Referral:     Discharge planning Services  CM Consult  Post Acute Care Choice:    Choice offered to:     DME Arranged:    DME Agency:     HH Arranged:    HH Agency:     Status of Service:  In process, will continue to follow  Medicare Important Message Given:  Yes-second notification given Date Medicare IM Given:    Medicare IM give by:    Date Additional Medicare IM Given:    Additional Medicare Important Message give by:     If discussed at Long Length of Stay Meetings, dates discussed:    Additional Comments: Chart reviewed and CM following for DC needs. Bartholome Bill, RN 02/01/2015, 12:17 PM

## 2015-02-01 NOTE — Progress Notes (Signed)
SICKLE CELL SERVICE PROGRESS NOTE  [name] ZOX:[id number] DOB: December 17, 1975 DOA: 01/29/2015 PCP: Dorrene German, MD  Assessment/Plan: Principal Problem:   Sickle cell crisis (HCC) Active Problems:   Anemia   Sickle cell pain crisis (HCC)  1. Hb [name] with crisis: He has used 34.3 mg with 67/49:demands./deliveries in the last 6 hours. I will schedule oral analgesics and continue PCA for PRN use, continue MS Contin and Toradol. Encourage increased use of the PCA. Anticipate discharge in 24-48 hours. 2. Nausea: resolved. Will decrease IVF to [facility] now that patient tolerating oral intake better. 3. Anemia; Pt has a mild anemia with baseline Hb at 10. Currently at baseline Hb.  Code Status: Full Code Family Communication: N/A Disposition Plan: Not yet ready for discharge  [name] A.  Pager [phone]. If 7PM-7AM, please contact night-coverage.  02/01/2015, 10:23 AM  LOS: 3 days    Consultants:  None     Procedures:  None  Antibiotics:  None  Subjective Pt states that he is feeling much better today. His pain is still localized to the right shoulder and ribcage but has decreased in intensity to 6/10. Also nausea has improved and he has been able to tolerate oral intake better.  Objective: Filed Vitals:   02/01/15 0352 02/01/15 0430 02/01/15 0800 02/01/15 0931  BP:  122/79  128/78  Pulse:  64  58  Temp:  97.8 F (36.6 C)  97.6 F (36.4 C)  TempSrc:  Oral    Resp: Height:      Weight:  165 lb 2 oz (74.9 kg)    SpO2:  93% 100% 96%   Weight change: -3 lb 4.9 oz (-1.5 kg)  Intake/Output Summary (Last 24 hours) at 02/01/15 1023 Last data filed at 02/01/15 1016  Gross per 24 hour  Intake   1830 ml  Output   1775 ml  Net     55 ml    General: Alert, awake, oriented x3, in no apparent distress.  HEENT: [place]/AT PEERL, EOMI, anicteric OROPHARYNX:  Moist, No exudate/ erythema/lesions.  Heart: Regular rate and rhythm, without murmurs, rubs, gallops.   Lungs: Clear to auscultation, no wheezing or rhonchi noted. No increased vocal fremitus resonant to percussion  Abdomen: Soft, nontender, nondistended, positive bowel sounds, no masses no hepatosplenomegaly noted.  Neuro: No focal neurological deficits noted cranial nerves II through XII grossly intact.  Strength at functional baseline in bilateral upper and lower extremities. Musculoskeletal: No warm swelling or erythema around joints, no spinal tenderness noted. Psychiatric: Patient alert and oriented x3, good insight and cognition, good recent to remote recall.    Data Reviewed: Basic Metabolic Panel:  Recent Labs Lab 01/29/15 2052 01/30/15 0420  NA 141 138  K 3.6 3.5  CL 105 103  CO2 29 27  GLUCOSE 72 93  BUN 10 10  CREATININE 0.67 0.67  CALCIUM 9.5 9.0   Liver Function Tests:  Recent Labs Lab 01/29/15 2052 01/30/15 0420  AST 25 23  ALT 16* 14*  ALKPHOS 78 68  BILITOT 2.8* 2.8*  PROT 8.3* 7.4  ALBUMIN 4.7 4.1   No results for input(s): LIPASE, AMYLASE in the last 168 hours. No results for input(s): AMMONIA in the last 168 hours. CBC:  Recent Labs Lab 01/29/15 2052 01/30/15 0420 02/01/15 0940  WBC 9.6 9.2 10.4  NEUTROABS 5.2 4.3 6.4  HGB 11.6* 10.9* 11.0*  HCT 32.1* 30.2* 30.3*  MCV 93.9 94.7 94.1  PLT 260 254 240  Cardiac Enzymes: No results for input(s): CKTOTAL, CKMB, CKMBINDEX, TROPONINI in the last 168 hours. BNP (last 3 results) No results for input(s): BNP in the last 8760 hours.  ProBNP (last 3 results) No results for input(s): PROBNP in the last 8760 hours.  CBG: No results for input(s): GLUCAP in the last 168 hours.  No results found for this or any previous visit (from the past 240 hour(s)).   Studies: Dg Chest 2 View  01/29/2015   CLINICAL DATA:  Per pt, states sickle cell pain for 3-4 days-right side chest radiating to back  EXAM: CHEST  2 VIEW  COMPARISON:  10/12/2014  FINDINGS: Lungs are hyperinflated. Heart size is normal.  Scarring identified in the right lung is stable. There are no focal consolidations or pleural effusions.  IMPRESSION: Stable appearance of the chest .   Electronically Signed   By: Norva Pavlov M.D.   On: 01/29/2015 17:19   Ct Angio Chest Pe W/cm &/or Wo Cm  01/30/2015   CLINICAL DATA:  Chest pain.  EXAM: CT ANGIOGRAPHY CHEST WITH CONTRAST  TECHNIQUE: Multidetector CT imaging of the chest was performed using the standard protocol during bolus administration of intravenous contrast. Multiplanar CT image reconstructions and MIPs were obtained to evaluate the vascular anatomy.  CONTRAST:  OMNIPAQUE IOHEXOL 350 MG/ML SOLN  COMPARISON:  CT scan of April 28, 2010.  FINDINGS: Sclerotic densities are noted throughout the spine consistent with a history of sickle cell disease. No pneumothorax or pleural effusion is noted. Stable subpleural blebs are noted in the right lower lobe. Stable scarring is noted in both lung bases. No acute pulmonary disease is noted. There is no evidence of thoracic aortic dissection or aneurysm. There is no evidence of pulmonary embolus. No significant mediastinal mass or adenopathy is noted. Visualized portion of upper abdomen is unremarkable.  Review of the MIP images confirms the above findings.  IMPRESSION: No evidence of pulmonary embolus.  Stable subpleural blebs are noted in right lung base with scarring seen in both lung bases.  No acute abnormality is noted in the chest.   Electronically Signed   By: Lupita Raider, M.D.   On: 01/30/2015 08:55    Scheduled Meds: . enoxaparin (LOVENOX) injection  40 mg Subcutaneous Q24H  . folic acid  1 mg Oral Daily  . HYDROmorphone PCA 2 mg/mL   Intravenous 6 times per day  . Influenza vac split quadrivalent PF  0.5 mL Intramuscular Tomorrow-1000  . ketorolac  30 mg Intravenous 4 times per day  . morphine  60 mg Oral BID  . senna-docusate  1 tablet Oral BID   Continuous Infusions: . dextrose 5 % and 0.45% NaCl 75 mL/hr at  01/31/15 1534    Total time 25 minutes.

## 2015-02-02 NOTE — Progress Notes (Signed)
Wasted 20 ml of dilaudid pca /ml , with Vikki Ports RN.

## 2015-02-03 NOTE — Discharge Summary (Signed)
Physician Discharge Summary  Patient ID: DAIWIK BUFFALO MRN: [id number] DOB/AGE: 1975-11-08 39 y.o.  Admit date: 01/29/2015 Discharge date: 02/02/2015  Admission Diagnoses:  Discharge Diagnoses:  Principal Problem:   Sickle cell crisis (HCC) Active Problems:   Anemia   Sickle cell pain crisis [facility])   Discharged Condition: good  Hospital Course: Patient was admitted with sickle cell painful crisis. He also had nausea. No vomiting. He was treated with IV Dilaudid PCA, Toradol and IV fluids. He was gradually transition to oral medications. Patient did much better and was feeling better at the time of discharge. He was discharged home on his home regimen to follow with his primary care physician. Patient felt better and was at his baseline.  Consults: None  Significant Diagnostic Studies: labs: CBCs and CMPs checked mostly within his normal limits.  Treatments: IV hydration and analgesia: Dilaudid  Discharge Exam: Blood pressure 121/75, pulse 70, temperature 98.2 F (36.8 C), temperature source Oral, resp. rate 16, height  (1.88 m), weight 75.3 kg (166 lb 0.1 oz), SpO2 94 %. General appearance: alert, cooperative and no distress Eyes: conjunctivae/corneas clear. PERRL, EOM's intact. Fundi benign. Ears: normal TM's and external ear canals both ears Back: symmetric, no curvature. ROM normal. No CVA tenderness. Resp: clear to auscultation bilaterally Chest wall: no tenderness Cardio: regular rate and rhythm, S1, S2 normal, no murmur, click, rub or gallop GI: soft, non-tender; bowel sounds normal; no masses,  no organomegaly Extremities: extremities normal, atraumatic, no cyanosis or edema Pulses: 2+ and symmetric Skin: Skin color, texture, turgor normal. No rashes or lesions Neurologic: Grossly normal  Disposition: 01-Home or Self Care     Medication List    TAKE these medications        folic acid 1 MG tablet  Commonly known as:  FOLVITE  Take 1 mg by mouth daily.     morphine 30 MG tablet  Commonly known as:  MSIR  Take 30 mg by mouth every 8 (eight) hours as needed for severe pain (pain).     morphine 60 MG 12 hr tablet  Commonly known as:  MS CONTIN  Take 1 tablet (60 mg total) by mouth 2 (two) times daily.     promethazine 25 MG tablet  Commonly known as:  PHENERGAN  Take 25 mg by mouth every 6 (six) hours as needed for nausea (nausea).         SignedLonia Blood 02/02/2015, 10:00 PM  Time spent 32 minutes

## 2015-02-08 ENCOUNTER — Inpatient Hospital Stay (HOSPITAL_COMMUNITY)
Admission: EM | Admit: 2015-02-08 | Discharge: 2015-02-13 | DRG: 812 | Disposition: A | Discharge status: Home / Self Care | Payer: Medicare Other | Attending: Internal Medicine | Admitting: Internal Medicine

## 2015-02-08 ENCOUNTER — Inpatient Hospital Stay (HOSPITAL_COMMUNITY): Payer: Medicare Other

## 2015-02-08 ENCOUNTER — Emergency Department (HOSPITAL_COMMUNITY): Payer: Medicare Other

## 2015-02-08 ENCOUNTER — Encounter (HOSPITAL_COMMUNITY): Payer: Self-pay

## 2015-02-08 DIAGNOSIS — F17200 Nicotine dependence, unspecified, uncomplicated: Secondary | ICD-10-CM | POA: Diagnosis present

## 2015-02-08 DIAGNOSIS — M25511 Pain in right shoulder: Secondary | ICD-10-CM | POA: Diagnosis present

## 2015-02-08 DIAGNOSIS — R0902 Hypoxemia: Secondary | ICD-10-CM | POA: Diagnosis present

## 2015-02-08 DIAGNOSIS — Z79899 Other long term (current) drug therapy: Secondary | ICD-10-CM | POA: Diagnosis not present

## 2015-02-08 DIAGNOSIS — Z79891 Long term (current) use of opiate analgesic: Secondary | ICD-10-CM

## 2015-02-08 DIAGNOSIS — J9811 Atelectasis: Secondary | ICD-10-CM | POA: Diagnosis present

## 2015-02-08 DIAGNOSIS — J189 Pneumonia, unspecified organism: Secondary | ICD-10-CM | POA: Diagnosis present

## 2015-02-08 DIAGNOSIS — Z96641 Presence of right artificial hip joint: Secondary | ICD-10-CM | POA: Diagnosis present

## 2015-02-08 DIAGNOSIS — D638 Anemia in other chronic diseases classified elsewhere: Secondary | ICD-10-CM | POA: Diagnosis present

## 2015-02-08 DIAGNOSIS — M879 Osteonecrosis, unspecified: Secondary | ICD-10-CM | POA: Diagnosis present

## 2015-02-08 DIAGNOSIS — R29898 Other symptoms and signs involving the musculoskeletal system: Secondary | ICD-10-CM

## 2015-02-08 DIAGNOSIS — D5700 Hb-SS disease with crisis, unspecified: Principal | ICD-10-CM

## 2015-02-08 DIAGNOSIS — Y95 Nosocomial condition: Secondary | ICD-10-CM | POA: Diagnosis present

## 2015-02-08 DIAGNOSIS — K5900 Constipation, unspecified: Secondary | ICD-10-CM | POA: Diagnosis present

## 2015-02-08 DIAGNOSIS — R0789 Other chest pain: Secondary | ICD-10-CM | POA: Diagnosis not present

## 2015-02-08 DIAGNOSIS — R200 Anesthesia of skin: Secondary | ICD-10-CM

## 2015-02-08 DIAGNOSIS — R202 Paresthesia of skin: Secondary | ICD-10-CM

## 2015-02-08 DIAGNOSIS — R0781 Pleurodynia: Secondary | ICD-10-CM | POA: Diagnosis present

## 2015-02-08 DIAGNOSIS — D5701 Hb-SS disease with acute chest syndrome: Secondary | ICD-10-CM | POA: Diagnosis present

## 2015-02-08 LAB — CBC WITH DIFFERENTIAL/PLATELET
BASOS ABS: 0.1 10*3/uL (ref 0.0–0.1)
BASOS PCT: 1 %
EOS ABS: 0.6 10*3/uL (ref 0.0–0.7)
EOS PCT: 6 %
HCT: 29.8 % — ABNORMAL LOW (ref 39.0–52.0)
Hemoglobin: 10.9 g/dL — ABNORMAL LOW (ref 13.0–17.0)
LYMPHS PCT: 28 %
Lymphs Abs: 2.9 10*3/uL (ref 0.7–4.0)
MCH: 34.3 pg — ABNORMAL HIGH (ref 26.0–34.0)
MCHC: 36.6 g/dL — ABNORMAL HIGH (ref 30.0–36.0)
MCV: 93.7 fL (ref 78.0–100.0)
Monocytes Absolute: 1 10*3/uL (ref 0.1–1.0)
Monocytes Relative: 10 %
NEUTROS ABS: 5.7 10*3/uL (ref 1.7–7.7)
NEUTROS PCT: 55 %
PLATELETS: 307 10*3/uL (ref 150–400)
RBC: 3.18 MIL/uL — AB (ref 4.22–5.81)
RDW: 13.8 % (ref 11.5–15.5)
WBC: 10.4 10*3/uL (ref 4.0–10.5)

## 2015-02-08 LAB — COMPREHENSIVE METABOLIC PANEL
ALBUMIN: 4.4 g/dL (ref 3.5–5.0)
ALT: 19 U/L (ref 17–63)
ANION GAP: 5 (ref 5–15)
AST: 27 U/L (ref 15–41)
Alkaline Phosphatase: 75 U/L (ref 38–126)
BILIRUBIN TOTAL: 2.5 mg/dL — AB (ref 0.3–1.2)
BUN: 13 mg/dL (ref 6–20)
CHLORIDE: 108 mmol/L (ref 101–111)
CO2: 26 mmol/L (ref 22–32)
Calcium: 9.1 mg/dL (ref 8.9–10.3)
Creatinine, Ser: 0.81 mg/dL (ref 0.61–1.24)
GFR calc Af Amer: >60 mL/min (ref >60)
Glucose, Bld: 76 mg/dL (ref 65–99)
POTASSIUM: 3.9 mmol/L (ref 3.5–5.1)
Sodium: 139 mmol/L (ref 135–145)
TOTAL PROTEIN: 8.2 g/dL — AB (ref 6.5–8.1)

## 2015-02-08 LAB — RETICULOCYTES
RBC.: 3.22 MIL/uL — ABNORMAL LOW (ref 4.22–5.81)
RETIC COUNT ABSOLUTE: 74.1 10*3/uL (ref 19.0–186.0)
Retic Ct Pct: 2.3 % (ref 0.4–3.1)

## 2015-02-08 LAB — I-STAT TROPONIN, ED: TROPONIN I, POC: 0 ng/mL (ref 0.00–0.08)

## 2015-02-08 LAB — I-STAT CG4 LACTIC ACID, ED: Lactic Acid, Venous: 1.47 mmol/L (ref 0.5–2.0)

## 2015-02-08 MED ORDER — POLYETHYLENE GLYCOL 3350 17 G PO PACK
17.0000 g | PACK | Freq: Every day | ORAL | Status: DC | PRN
  Administered 2015-02-12: 17 g via ORAL
  Filled 2015-02-08: qty 1

## 2015-02-08 MED ORDER — SODIUM CHLORIDE 0.9 % IJ SOLN: 9.0000 mL | INTRAMUSCULAR | Status: DC | PRN

## 2015-02-08 MED ORDER — DEXTROSE-NACL 5-0.45 % IV SOLN
INTRAVENOUS | Status: AC
  Administered 2015-02-09 (×2): via INTRAVENOUS

## 2015-02-08 MED ORDER — DIPHENHYDRAMINE HCL 25 MG PO CAPS
25.0000 mg | ORAL_CAPSULE | ORAL | Status: DC | PRN
  Administered 2015-02-08: 25 mg via ORAL
  Filled 2015-02-08: qty 1

## 2015-02-08 MED ORDER — ONDANSETRON HCL 4 MG PO TABS: 4.0000 mg | ORAL_TABLET | ORAL | Status: DC | PRN

## 2015-02-08 MED ORDER — DEXTROSE 5 % IV SOLN: 1.0000 g | Freq: Once | INTRAVENOUS | Status: DC

## 2015-02-08 MED ORDER — DEXTROSE 5 % IV SOLN
2.0000 g | Freq: Three times a day (TID) | INTRAVENOUS | Status: DC
  Administered 2015-02-08 – 2015-02-10 (×5): 2 g via INTRAVENOUS
  Filled 2015-02-08 (×8): qty 2

## 2015-02-08 MED ORDER — HYDROXYZINE HCL 25 MG PO TABS
25.0000 mg | ORAL_TABLET | ORAL | Status: DC | PRN
  Administered 2015-02-09: 25 mg via ORAL
  Filled 2015-02-08: qty 1

## 2015-02-08 MED ORDER — SENNOSIDES-DOCUSATE SODIUM 8.6-50 MG PO TABS
1.0000 | ORAL_TABLET | Freq: Two times a day (BID) | ORAL | Status: DC
  Administered 2015-02-08 – 2015-02-13 (×10): 1 via ORAL
  Filled 2015-02-08 (×10): qty 1

## 2015-02-08 MED ORDER — ONDANSETRON HCL 4 MG/2ML IJ SOLN: 4.0000 mg | Freq: Three times a day (TID) | INTRAMUSCULAR | Status: DC | PRN

## 2015-02-08 MED ORDER — ENOXAPARIN SODIUM 40 MG/0.4ML ~~LOC~~ SOLN
40.0000 mg | Freq: Every day | SUBCUTANEOUS | Status: DC
  Administered 2015-02-08 – 2015-02-12 (×5): 40 mg via SUBCUTANEOUS
  Filled 2015-02-08 (×6): qty 0.4

## 2015-02-08 MED ORDER — IBUPROFEN 800 MG PO TABS: 800.0000 mg | ORAL_TABLET | Freq: Three times a day (TID) | ORAL | Status: DC | PRN

## 2015-02-08 MED ORDER — FOLIC ACID 1 MG PO TABS
1.0000 mg | ORAL_TABLET | Freq: Every day | ORAL | Status: DC
  Administered 2015-02-09 – 2015-02-13 (×5): 1 mg via ORAL
  Filled 2015-02-08 (×5): qty 1

## 2015-02-08 MED ORDER — ONDANSETRON HCL 4 MG/2ML IJ SOLN
4.0000 mg | Freq: Four times a day (QID) | INTRAMUSCULAR | Status: DC | PRN
  Administered 2015-02-09 – 2015-02-10 (×4): 4 mg via INTRAVENOUS
  Filled 2015-02-08: qty 2

## 2015-02-08 MED ORDER — ONDANSETRON HCL 4 MG/2ML IJ SOLN
4.0000 mg | INTRAMUSCULAR | Status: DC | PRN
  Administered 2015-02-09 (×2): 4 mg via INTRAVENOUS
  Filled 2015-02-08 (×5): qty 2

## 2015-02-08 MED ORDER — MORPHINE SULFATE ER 30 MG PO TBCR
60.0000 mg | EXTENDED_RELEASE_TABLET | Freq: Two times a day (BID) | ORAL | Status: DC
  Administered 2015-02-08 – 2015-02-13 (×10): 60 mg via ORAL
  Filled 2015-02-08 (×10): qty 2

## 2015-02-08 MED ORDER — NALOXONE HCL 0.4 MG/ML IJ SOLN: 0.4000 mg | INTRAMUSCULAR | Status: DC | PRN

## 2015-02-08 MED ORDER — HYDROMORPHONE HCL 2 MG/ML IJ SOLN
0.0250 mg/kg | INTRAMUSCULAR | Status: AC
  Administered 2015-02-08: 1.9 mg via INTRAVENOUS
  Filled 2015-02-08: qty 1

## 2015-02-08 MED ORDER — HYDROMORPHONE HCL 1 MG/ML IJ SOLN
1.0000 mg | INTRAMUSCULAR | Status: DC | PRN
  Administered 2015-02-08: 1 mg via INTRAVENOUS
  Filled 2015-02-08: qty 1

## 2015-02-08 MED ORDER — DIPHENHYDRAMINE HCL 12.5 MG/5ML PO ELIX: 12.5000 mg | ORAL_SOLUTION | Freq: Four times a day (QID) | ORAL | Status: DC | PRN

## 2015-02-08 MED ORDER — HYDROMORPHONE 2 MG/ML HIGH CONCENTRATION IV PCA SOLN
INTRAVENOUS | Status: DC
  Administered 2015-02-09: 01:00:00 via INTRAVENOUS
  Administered 2015-02-09: 6 mg via INTRAVENOUS
  Administered 2015-02-09: 8.25 mg via INTRAVENOUS

## 2015-02-08 MED ORDER — HYDROMORPHONE HCL 2 MG/ML IJ SOLN: 0.0250 mg/kg | INTRAMUSCULAR | Status: AC

## 2015-02-08 MED ORDER — VANCOMYCIN HCL IN DEXTROSE 1-5 GM/200ML-% IV SOLN
1000.0000 mg | Freq: Three times a day (TID) | INTRAVENOUS | Status: DC
  Administered 2015-02-09 – 2015-02-10 (×5): 1000 mg via INTRAVENOUS
  Filled 2015-02-08 (×9): qty 200

## 2015-02-08 MED ORDER — SODIUM CHLORIDE 0.45 % IV SOLN
INTRAVENOUS | Status: DC
  Administered 2015-02-08: 20:00:00 via INTRAVENOUS

## 2015-02-08 MED ORDER — AZITHROMYCIN 250 MG PO TABS: 500.0000 mg | ORAL_TABLET | Freq: Once | ORAL | Status: DC

## 2015-02-08 MED ORDER — SODIUM CHLORIDE 0.9 % IV SOLN
12.5000 mg | Freq: Four times a day (QID) | INTRAVENOUS | Status: DC | PRN
  Filled 2015-02-08: qty 0.25

## 2015-02-08 NOTE — H&P (Signed)
PCP: Dorrene German, MD     Referring provider [place]   Chief Complaint:  Chest pain, back pain and cough  HPI: [name] is a 39 y.o. 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   Presented with  pain which is typical for his sickle cell crisis involving right shoulder and rib cage. Patient  Also have been  coughing associated but it has been mild. Reports rib cage pain as well as right shoulder pain for the past 2 days. He also endorses that there is some tingling in his right hand. The tingling and numbness in right hand has been coming and going. It lasted for at least 24 hours. The pain itself fills sharp [place]. At this point his elevated tingling has resolved. Examination states he has been taking his home medicines he is on dose of MS Contin. He did not endorse any fevers no chills he did have some cough productive of sputum. A bit of shortness of breath. In ER chest x-ray was done worrisome for infiltrate. Hemoglobin at baseline 10.9  Patient admitted for possible acute chest.    Hospitalist was called for admission for sickle cell pain and community-acquired pneumonia versus acute chest  Review of Systems:    Pertinent positives include: fatigue, right shoulder pain, shortness of breath at rest. chest pain,   Constitutional:  No weight loss, night sweats, Fevers, chills, weight loss  HEENT:  No headaches, Difficulty swallowing,Tooth/dental problems,Sore throat,  No sneezing, itching, ear ache, nasal congestion, post nasal drip,  Cardio-vascular:  No Orthopnea, PND, anasarca, dizziness, palpitations.no Bilateral lower extremity swelling  GI:  No heartburn, indigestion, abdominal pain, nausea, vomiting, diarrhea, change in bowel habits, loss of appetite, melena, blood in stool,  hematemesis Resp:  no No dyspnea on exertion, No excess mucus, no productive cough, No non-productive cough, No coughing up of blood.No change in color of mucus.No wheezing. Skin:  no rash or lesions. No jaundice GU:  no dysuria, change in color of urine, no urgency or frequency. No straining to urinate.  No flank pain.  Musculoskeletal:  No joint pain or no joint swelling. No decreased range of motion. No back pain.  Psych:  No change in mood or affect. No depression or anxiety. No memory loss.  Neuro: no localizing neurological complaints, no tingling, no weakness, no double vision, no gait abnormality, no slurred speech, no confusion  Otherwise ROS are negative except for above, 10 systems were reviewed  Past Medical History: Past Medical History  Diagnosis Date  . Sickle cell crisis (HCC)   . Blood transfusion   . Avascular necrosis of hip (HCC)     bilateral  . Infection of bone, shoulder region (HCC)     left shoulder  . Pneumonia   . Avascular necrosis of hip, left (HCC) 08/27/2011   Past Surgical History  Procedure Laterality Date  . Orif right hip fracture  1995  . Joint replacement  2006    right total hip arthroplasty  . Bone graft hip iliac crest       Medications: Prior to Admission medications   Medication Sig Start Date End Date Taking? Authorizing Provider  folic acid (FOLVITE) 1 MG tablet Take 1 mg by mouth daily.   Yes Historical Provider, MD  morphine (MS CONTIN) 60 MG 12 hr tablet  Take 1 tablet (60 mg total) by mouth 2 (two) times daily. Patient taking differently: Take 60 mg by mouth 2 (two) times daily as needed for pain.  11/20/11  Yes Grayce SessionsMichelle P Edwards, NP  morphine (MSIR) 30 MG tablet Take 30 mg by mouth every 8 (eight) hours as needed for severe pain (pain).    Yes Historical Provider, MD  promethazine (PHENERGAN) 25 MG tablet Take 25 mg by mouth every 6 (six) hours as needed for nausea (nausea).    Yes Historical Provider, MD    Allergies:  No  Known Allergies  Social History:  Ambulatory  independently  Lives at home  With family     reports that he has been smoking Cigarettes.  He has been smoking about 0.50 packs per day. He has never used smokeless tobacco. He reports that he does not drink alcohol or use illicit drugs.    Family History: family history is not on file. He was adopted.    Physical Exam: Patient Vitals for the past 24 hrs:  BP Temp Temp src Pulse Resp SpO2  02/08/15 2000 127/82 mmHg - - 71 18 97 %  02/08/15 1844 126/85 mmHg 98.3 F (36.8 C) Oral 82 16 97 %    1. General:  in No Acute distress 2. Psychological: Alert and  Oriented 3. Head/ENT:   Dry Mucous Membranes                          Head Non traumatic, neck supple                          Normal  Dentition 4. SKIN decreased Skin turgor,  Skin clean Dry and intact no rash 5. Heart: Regular rate and rhythm no Murmur, Rub or gallop 6. Lungs: Clear to auscultation bilaterally, no wheezes or crackles   7. Abdomen: Soft, non-tender, Non distended 8. Lower extremities: no clubbing, cyanosis, or edema 9. Neurologically no pronator drift, mild right hand grip weakness.  10. MSK: Normal range of motion  body mass index is unknown because there is no weight on file.   Labs on Admission:   Results for orders placed or performed during the hospital encounter of 02/08/15 (from the past 24 hour(s))  Comprehensive metabolic panel     Status: Abnormal   Collection Time: 02/08/15  7:43 PM  Result Value Ref Range   Sodium 139 135 - 145 mmol/L   Potassium 3.9 3.5 - 5.1 mmol/L   Chloride 108 101 - 111 mmol/L   CO2 26 22 - 32 mmol/L   Glucose, Bld 76 65 - 99 mg/dL   BUN 13 6 - 20 mg/dL   Creatinine, Ser [id number] 0.61 - 1.24 mg/dL   Calcium 9.1 8.9 - 95.210.3 mg/dL   Total Protein 8.2 (H) 6.5 - 8.1 g/dL   Albumin 4.4 3.5 - 5.0 g/dL   AST 27 15 - 41 U/L   ALT 19 17 - 63 U/L   Alkaline Phosphatase 75 38 - 126 U/L   Total Bilirubin 2.5 (H) 0.3 - 1.2 mg/dL    GFR calc non Af Amer >60 >60 mL/min   GFR calc Af Amer >60 >60 mL/min   Anion gap 5 5 - 15  CBC with Differential     Status: Abnormal   Collection Time: 02/08/15  7:43 PM  Result Value Ref Range   WBC 10.4 4.0 - 10.5 K/uL  RBC 3.18 (L) 4.22 - 5.81 MIL/uL   Hemoglobin 10.9 (L) 13.0 - 17.0 g/dL   HCT 16.1 (L) 09.6 - 04.5 %   MCV 93.7 78.0 - 100.0 fL   MCH 34.3 (H) 26.0 - 34.0 pg   MCHC 36.6 (H) 30.0 - 36.0 g/dL   RDW 40.9 81.1 - 91.4 %   Platelets 307 150 - 400 K/uL   Neutrophils Relative % 55 %   Neutro Abs 5.7 1.7 - 7.7 K/uL   Lymphocytes Relative 28 %   Lymphs Abs 2.9 0.7 - 4.0 K/uL   Monocytes Relative 10 %   Monocytes Absolute 1.0 0.1 - 1.0 K/uL   Eosinophils Relative 6 %   Eosinophils Absolute 0.6 0.0 - 0.7 K/uL   Basophils Relative 1 %   Basophils Absolute 0.1 0.0 - 0.1 K/uL  I-stat troponin, ED     Status: None   Collection Time: 02/08/15  7:47 PM  Result Value Ref Range   Troponin i, poc 0.00 0.00 - 0.08 ng/mL   Comment 3          I-Stat CG4 Lactic Acid, ED     Status: None   Collection Time: 02/08/15  7:59 PM  Result Value Ref Range   Lactic Acid, Venous 1.47 0.5 - 2.0 mmol/L    UA not obtained  No results found for: HGBA1C  Estimated Creatinine Clearance: 130.4 mL/min (by C-G formula based on Cr of 0.81).  BNP (last 3 results) No results for input(s): PROBNP in the last 8760 hours.  Other results:  I have pearsonaly reviewed this: ECG REPORT  Rate:80  Rhythm: Sinus rhythm ST&T Change: ST segment upsloping elevation more consistent with early repolarization diffuse leads similar to EKG from June QTC 429  There were no vitals filed for this visit.   Cultures:    Component Value Date/Time   SDES SPUTUM 05/16/2011 0209   SPECREQUEST NONE 05/16/2011 0209   CULT NORMAL OROPHARYNGEAL FLORA 05/16/2011 0209   REPTSTATUS 05/18/2011 FINAL 05/16/2011 0209     Radiological Exams on Admission: Dg Chest 2 View  02/08/2015  CLINICAL DATA:  39 year old  male with history of right-sided chest pain and rib cage pain and some right shoulder pain radiating down the right arm into the right hand with associated numbness and tingling for the past 2 days. History of sickle cell disease. EXAM: CHEST  2 VIEW COMPARISON:  Chest x-ray 01/29/2015.  Chest CT 01/30/2015. FINDINGS: New 1.6 cm nodular density in the right mid lung appreciated only on the frontal projection. No acute consolidative airspace disease. No pleural effusions. No evidence of pulmonary edema. Heart size is normal. Upper mediastinal contours are within normal limits. Sclerosis in the visualized portions of the left humeral head presumably indicative of avascular necrosis. IMPRESSION: 1. New small nodular density in the right mid lung, presumably infectious or inflammatory given the rapid development compared to the prior study. Chest x-ray is otherwise unremarkable. Electronically Signed   By: Trudie Reed M.D.   On: 02/08/2015 19:40    Chart has been reviewed  Family not  at  Bedside    Assessment/Plan 39 year old gentleman with known history of sickle cell disease here with somewhat typical A most likely secondary to pain crisis but also evidence of infiltrate on chest x-ray worrisome for acute chest sources can read her pneumonia   Present on Admission:  . Sickle cell pain crisis (HCC) Will admit for sickle cell protocol  Right hand weakness and numbness - deenies  any neck pain  Given hx of sickle cell disease will eval for TIA  obtain Mri of brain and cervical spine.  . Acute chest syndrome (HCC) we'll make sure patient is on antibiotics obtained blood cultures  . Chest pain - cycle cardiac enzymes monitored on telemetry but most likely this is secondary to sickle cell disease  . CAP (community acquired pneumonia) -  - will admit for treatment of CAP will start on appropriate antibiotic coverage.   Obtain sputum cultures, blood cultures if febrile or if decompensates.  Provide  oxygen as needed.    Prophylaxis:   Lovenox   CODE STATUS:  FULL CODE   Disposition:  To home once workup is complete and patient is stable  Other plan as per orders.  I have spent a total of 65 min on this admission  Constance Whittle 02/08/2015, 8:44 PM  [facility]  Pager [phone]   after 2 AM please page floor coverage PA If 7AM-7PM, please contact the day team taking care of the patient  [email or web address]  Password [email or web address]

## 2015-02-08 NOTE — Progress Notes (Signed)
ANTIBIOTIC CONSULT NOTE - INITIAL  Pharmacy Consult for vancomycin Indication: pneumonia  No Known Allergies  Vital Signs: Temp: 98.3 F (36.8 C) (10/14 1844) Temp Source: Oral (10/14 1844) BP: 126/85 mmHg (10/14 1844) Pulse Rate: 82 (10/14 1844) Intake/Output from previous day:   Intake/Output from this shift:    Labs:  Recent Labs  02/08/15 1943  WBC 10.4  HGB 10.9*  PLT 307  CREATININE 0.81   Estimated Creatinine Clearance: 130.4 mL/min (by C-G formula based on Cr of 0.81). No results for input(s): VANCOTROUGH, VANCOPEAK, VANCORANDOM, GENTTROUGH, GENTPEAK, GENTRANDOM, TOBRATROUGH, TOBRAPEAK, TOBRARND, AMIKACINPEAK, AMIKACINTROU, AMIKACIN in the last 72 hours.   Microbiology: No results found for this or any previous visit (from the past 720 hour(s)).  Medical History: Past Medical History  Diagnosis Date  . Sickle cell crisis (HCC)   . Blood transfusion   . Avascular necrosis of hip (HCC)     bilateral  . Infection of bone, shoulder region (HCC)     left shoulder  . Pneumonia   . Avascular necrosis of hip, left (HCC) 08/27/2011   Assessment: 39 year old male with history of sickle cell disease presenting for evaluation of sickle cell related pain.  Chest x-ray concerning for pneumonia, pharmacy consulted to dose vancomycin.    10/14>> ceftazidime (MD) 10/14 >> vanc >>  Goal of Therapy:  Vancomycin trough level 15-20 mcg/ml  Plan:  Vancomycin 1gm IV q8h Follow renal function, cultures, clinical course vanc trough as needed  [name] RPh 02/08/2015, 8:41 PM Pager [phone]

## 2015-02-08 NOTE — ED Notes (Signed)
Delay lab draw, pt enroute to rm 2

## 2015-02-08 NOTE — ED Notes (Addendum)
Pt c/o rib cage pain, R shoulder pain radiating down R arm, and R hand numbness/tingling x 2 days.  Pain score 10/10.  Pt reports taking all medications as prescribed.  Denies injury to R shoulder.  Grip slightly weaker on R side.

## 2015-02-08 NOTE — ED Provider Notes (Signed)
CSN: [id number]     Arrival date & time 02/08/15  1833 History   First MD Initiated Contact with Patient 02/08/15 1903     Chief Complaint  Patient presents with  . Sickle Cell Pain Crisis  . Ribcage pain   . Shoulder Pain     (Consider location/radiation/quality/duration/timing/severity/associated sxs/prior Treatment) HPI   39 year old male with history of sickle cell disease presenting for evaluation of sickle cell related pain. Patient mentioned for the past 2-3 days he has had persistent pain to his right side of chest and pain along his right arm which he described as sharp and shooting sensation with occasional tingling and numbness sensation to his right hand. Tingling sensation has resolved however pain still persist. Rate pain as 10 out of 10. Pain is not adequately treated with home medications including MS Contin. Unable to identify triggers factors. Pain feels similar to sickle cell related pain the past. He denies having fever, chills, productive cough shortness of breath, abdominal pain, nausea vomiting  diarrhea or rash. Denies any headache and neck pain. Denies any recent injury.    Past Medical History  Diagnosis Date  . Sickle cell crisis (HCC)   . Blood transfusion   . Avascular necrosis of hip (HCC)     bilateral  . Infection of bone, shoulder region (HCC)     left shoulder  . Pneumonia   . Avascular necrosis of hip, left (HCC) 08/27/2011   Past Surgical History  Procedure Laterality Date  . Orif right hip fracture  1995  . Joint replacement  2006    right total hip arthroplasty  . Bone graft hip iliac crest     Family History  Problem Relation Age of Onset  . Adopted: Yes   Social History  Substance Use Topics  . Smoking status: Current Some Day Smoker -- 0.50 packs/day    Types: Cigarettes    Last Attempt to Quit: 01/27/2012  . Smokeless tobacco: Never Used  . Alcohol Use: No    Review of Systems  All other systems reviewed and are  negative.     Allergies  Review of patient's allergies indicates no known allergies.  Home Medications   Prior to Admission medications   Medication Sig Start Date End Date Taking? Authorizing Provider  folic acid (FOLVITE) 1 MG tablet Take 1 mg by mouth daily.    Historical Provider, MD  morphine (MS CONTIN) 60 MG 12 hr tablet Take 1 tablet (60 mg total) by mouth 2 (two) times daily. Patient taking differently: Take 60 mg by mouth 2 (two) times daily as needed for pain.  11/20/11   Grayce Sessions, NP  morphine (MSIR) 30 MG tablet Take 30 mg by mouth every 8 (eight) hours as needed for severe pain (pain).     Historical Provider, MD  promethazine (PHENERGAN) 25 MG tablet Take 25 mg by mouth every 6 (six) hours as needed for nausea (nausea).     Historical Provider, MD   BP 126/85 mmHg  Pulse 82  Temp(Src) 98.3 F (36.8 C) (Oral)  Resp 16  SpO2 97% Physical Exam  Constitutional: He appears well-developed and well-nourished. No distress.  HENT:  Head: Atraumatic.  Eyes: Conjunctivae are normal.  Neck: Neck supple.  Neck with full range of motion, no cervical midline spine tenderness crepitus or step-off.  Cardiovascular: Normal rate and regular rhythm.   Pulmonary/Chest: Effort normal and breath sounds normal. No respiratory distress. He has no wheezes. He has no rales. He  exhibits tenderness (Mild anterior right-sided chest wall tenderness without crepitus or emphysema.).  Abdominal: Soft. There is no tenderness.  Musculoskeletal: He exhibits tenderness (Mild diffuse tenderness throughout right arm without focal point tenderness. no Gross deformity or overlying skin changes.).  Neurological: He is alert.  Skin: No rash noted.  Psychiatric: He has a normal mood and affect.  Nursing note and vitals reviewed.   ED Course  Procedures (including critical care time)  Patient with history of sickle cell disease here with pain to right side of chest and right arm similar to  prior sickle cell. Is afebrile with a productive cough however with chest pain, an EKG was obtained and chest x-ray ordered.  7:49 PM Chest x-ray demonstrate a new small nodular density into the right midlung presumably infectious or inflammatory changes. This finding is concerning for pneumonia. Patient will need to be admitted for further management of his condition with concerns for acute chest. Blood culture and lactic acid ordered. Care discussed with Dr. Freida BusmanAllen.  Patient will be treated for hospital acquired pneumonia with Fortaz, and vancomycin.    8:39 PM Appreciate consultation from [facility] Dr. Adela Glimpseoutova who agrees to admit pt to telemetry bed, under her care.    Labs Review Labs Reviewed  COMPREHENSIVE METABOLIC PANEL - Abnormal; Notable for the following:    Total Protein 8.2 (*)    Total Bilirubin 2.5 (*)    All other components within normal limits  CBC WITH DIFFERENTIAL/PLATELET - Abnormal; Notable for the following:    RBC 3.18 (*)    Hemoglobin 10.9 (*)    HCT 29.8 (*)    MCH 34.3 (*)    MCHC 36.6 (*)    All other components within normal limits  CULTURE, BLOOD (ROUTINE X 2)  CULTURE, BLOOD (ROUTINE X 2)  RETICULOCYTES  I-STAT CG4 LACTIC ACID, ED  I-STAT TROPOININ, ED    Imaging Review Dg Chest 2 View  02/08/2015  CLINICAL DATA:  39 year old male with history of right-sided chest pain and rib cage pain and some right shoulder pain radiating down the right arm into the right hand with associated numbness and tingling for the past 2 days. History of sickle cell disease. EXAM: CHEST  2 VIEW COMPARISON:  Chest x-ray 01/29/2015.  Chest CT 01/30/2015. FINDINGS: New 1.6 cm nodular density in the right mid lung appreciated only on the frontal projection. No acute consolidative airspace disease. No pleural effusions. No evidence of pulmonary edema. Heart size is normal. Upper mediastinal contours are within normal limits. Sclerosis in the visualized portions of the left  humeral head presumably indicative of avascular necrosis. IMPRESSION: 1. New small nodular density in the right mid lung, presumably infectious or inflammatory given the rapid development compared to the prior study. Chest x-ray is otherwise unremarkable. Electronically Signed   By: Trudie Reedaniel  Entrikin M.D.   On: 02/08/2015 19:40   I have personally reviewed and evaluated these images and lab results as part of my medical decision-making.   EKG Interpretation None     ED ECG REPORT   Date: 02/08/2015  Rate: 80  Rhythm: normal sinus rhythm  QRS Axis: normal  Intervals: normal  ST/T Wave abnormalities: early repolarization  Conduction Disutrbances:none  Narrative Interpretation:   Old EKG Reviewed: unchanged  I have personally reviewed the EKG tracing and agree with the computerized printout as noted.   MDM   Final diagnoses:  Acute chest syndrome (HCC)  HCAP (healthcare-associated pneumonia)    BP 126/85 mmHg  Pulse  82  Temp(Src) 98.3 F (36.8 C) (Oral)  Resp 16  SpO2 97%     Fayrene Helper, PA-C 02/08/15 2040  Lorre Nick, MD 02/10/15 [phone]

## 2015-02-09 ENCOUNTER — Other Ambulatory Visit: Payer: Self-pay

## 2015-02-09 ENCOUNTER — Inpatient Hospital Stay (HOSPITAL_COMMUNITY): Payer: Medicare Other

## 2015-02-09 LAB — CBC WITH DIFFERENTIAL/PLATELET
Basophils Absolute: 0.1 10*3/uL (ref 0.0–0.1)
Basophils Relative: 1 %
EOS ABS: 0.8 10*3/uL — AB (ref 0.0–0.7)
EOS PCT: 8 %
HCT: 27.5 % — ABNORMAL LOW (ref 39.0–52.0)
Hemoglobin: 9.6 g/dL — ABNORMAL LOW (ref 13.0–17.0)
LYMPHS ABS: 3.7 10*3/uL (ref 0.7–4.0)
Lymphocytes Relative: 37 %
MCH: 32.4 pg (ref 26.0–34.0)
MCHC: 34.9 g/dL (ref 30.0–36.0)
MCV: 92.9 fL (ref 78.0–100.0)
MONO ABS: 1.1 10*3/uL — AB (ref 0.1–1.0)
Monocytes Relative: 11 %
NEUTROS ABS: 4.2 10*3/uL (ref 1.7–7.7)
Neutrophils Relative %: 43 %
PLATELETS: 277 10*3/uL (ref 150–400)
RBC: 2.96 MIL/uL — ABNORMAL LOW (ref 4.22–5.81)
RDW: 14 % (ref 11.5–15.5)
WBC: 9.9 10*3/uL (ref 4.0–10.5)

## 2015-02-09 LAB — COMPREHENSIVE METABOLIC PANEL
ALBUMIN: 4 g/dL (ref 3.5–5.0)
ALK PHOS: 65 U/L (ref 38–126)
ALT: 16 U/L — ABNORMAL LOW (ref 17–63)
ANION GAP: 6 (ref 5–15)
AST: 21 U/L (ref 15–41)
BUN: 10 mg/dL (ref 6–20)
CALCIUM: 9 mg/dL (ref 8.9–10.3)
CHLORIDE: 102 mmol/L (ref 101–111)
CO2: 27 mmol/L (ref 22–32)
Creatinine, Ser: 0.77 mg/dL (ref 0.61–1.24)
GFR calc Af Amer: >60 mL/min (ref >60)
GFR calc non Af Amer: >60 mL/min (ref >60)
GLUCOSE: 106 mg/dL — AB (ref 65–99)
POTASSIUM: 3.5 mmol/L (ref 3.5–5.1)
SODIUM: 135 mmol/L (ref 135–145)
Total Bilirubin: 2.7 mg/dL — ABNORMAL HIGH (ref 0.3–1.2)
Total Protein: 7.2 g/dL (ref 6.5–8.1)

## 2015-02-09 LAB — RETICULOCYTES
RBC.: 2.96 MIL/uL — ABNORMAL LOW (ref 4.22–5.81)
RETIC CT PCT: 2.4 % (ref 0.4–3.1)
Retic Count, Absolute: 71 10*3/uL (ref 19.0–186.0)

## 2015-02-09 LAB — TROPONIN I
Troponin I: <0.03 ng/mL (ref <0.031)
Troponin I: <0.03 ng/mL (ref <0.031)
Troponin I: <0.03 ng/mL (ref <0.031)
Troponin I: <0.03 ng/mL (ref <0.031)

## 2015-02-09 LAB — PHOSPHORUS: Phosphorus: 5.2 mg/dL — ABNORMAL HIGH (ref 2.5–4.6)

## 2015-02-09 LAB — MAGNESIUM: Magnesium: 2 mg/dL (ref 1.7–2.4)

## 2015-02-09 MED ORDER — KETOROLAC TROMETHAMINE 30 MG/ML IJ SOLN
30.0000 mg | Freq: Four times a day (QID) | INTRAMUSCULAR | Status: DC
Start: 2015-02-09 — End: 2015-02-13
  Administered 2015-02-09 – 2015-02-13 (×16): 30 mg via INTRAVENOUS
  Filled 2015-02-09 (×17): qty 1

## 2015-02-09 MED ORDER — HYDROMORPHONE 2 MG/ML HIGH CONCENTRATION IV PCA SOLN
INTRAVENOUS | Status: DC
  Administered 2015-02-09: 7 mg via INTRAVENOUS
  Administered 2015-02-09 (×2): 5.6 mg via INTRAVENOUS
  Administered 2015-02-10: 2.1 mg via INTRAVENOUS
  Administered 2015-02-10: 9.1 mg via INTRAVENOUS
  Administered 2015-02-10: 03:00:00 via INTRAVENOUS
  Administered 2015-02-10: 7.7 mg via INTRAVENOUS
  Administered 2015-02-10: 5.6 mg via INTRAVENOUS
  Administered 2015-02-10: 9.6 mg via INTRAVENOUS
  Administered 2015-02-11 (×2): 7.7 mg via INTRAVENOUS
  Administered 2015-02-11: 0.7 mg via INTRAVENOUS
  Filled 2015-02-09: qty 25

## 2015-02-09 NOTE — Progress Notes (Addendum)
Patient ID: [name], male   DOB: 03-17-1976, 39 y.o.   MRN: [id number] SICKLE CELL SERVICE PROGRESS NOTE  [name]:[id number] DOB: 25-Apr-1976 DOA: 02/08/2015 PCP: Dorrene German, MD    Consultants:  None  Procedures:  none  Antibiotics:  Vanc/Ceftazidime 02/09/15-->  HPI/Subjective: Pt's states that he is having pain mostly in his right shoulder and right rib. He still feels some tingling in his right arm. His CT head performed yesterday did not show any signs of an acute stroke. He denies any fever, cough, other chest pain, and changes in bowel or bladder habits.  Objective: Filed Vitals:   02/09/15 0032 02/09/15 0420 02/09/15 0508 02/09/15 0752  BP:   101/50   Pulse:   62   Temp:   98.7 F (37.1 C)   TempSrc:   Oral   Resp: 14 14 16 10   Height:      Weight:      SpO2: 97% 93% 93% 93%   Weight change:   Intake/Output Summary (Last 24 hours) at 02/09/15 1156 Last data filed at 02/09/15 1031  Gross per 24 hour  Intake    240 ml  Output    600 ml  Net   -360 ml    General: Alert, awake, oriented x3, in no acute distress.  HEENT: Painter/AT PEERL, EOMI Neck: Trachea midline,  no masses or lymphadenopathy OROPHARYNX:  Moist, No exudate/ erythema/lesions.  Heart: Regular rate and rhythm, without murmurs, rubs, gallops Lungs: Clear to auscultation, no wheezing or rhonchi noted.  Abdomen: Soft, nontender, nondistended, positive bowel sounds, no masses no hepatosplenomegaly noted..  Neuro: No focal neurological deficits noted cranial nerves II through XII grossly intact. Strength 5 out of 5 in bilateral lower extremities. RUE strength is 4/5 and LUE is 5/5.  Musculoskeletal: No warmth, swelling or erythema around joints, no spinal tenderness noted. FROM of b/l shoulders. Psychiatric: Patient alert and oriented x3, good insight and cognition, good recent to remote recall. Lymph node survey: No cervical axillary or inguinal lymphadenopathy noted. Extremities:  no clubbing, cyanosis, or edema  Data Reviewed: Basic Metabolic Panel:  Recent Labs Lab 02/08/15 1943 02/09/15 0739  NA 139 135  K 3.9 3.5  CL 108 102  CO2 26 27  GLUCOSE 76 106*  BUN 13 10  CREATININE 0.81 0.77  CALCIUM 9.1 9.0  MG  --  2.0  PHOS  --  5.2*   Liver Function Tests:  Recent Labs Lab 02/08/15 1943 02/09/15 0739  AST 27 21  ALT 19 16*  ALKPHOS 75 65  BILITOT 2.5* 2.7*  PROT 8.2* 7.2  ALBUMIN 4.4 4.0   No results for input(s): LIPASE, AMYLASE in the last 168 hours. No results for input(s): AMMONIA in the last 168 hours. CBC:  Recent Labs Lab 02/08/15 1943 02/09/15 0739  WBC 10.4 9.9  NEUTROABS 5.7 4.2  HGB 10.9* 9.6*  HCT 29.8* 27.5*  MCV 93.7 92.9  PLT 307 277   Cardiac Enzymes:  Recent Labs Lab 02/09/15 0040 02/09/15 0739  TROPONINI <0.03 <0.03    Studies: Dg Chest 2 View  02/08/2015  CLINICAL DATA:  39 year old male with history of right-sided chest pain and rib cage pain and some right shoulder pain radiating down the right arm into the right hand with associated numbness and tingling for the past 2 days. History of sickle cell disease. EXAM: CHEST  2 VIEW COMPARISON:  Chest x-ray 01/29/2015.  Chest CT 01/30/2015. FINDINGS: New 1.6 cm nodular density in  the right mid lung appreciated only on the frontal projection. No acute consolidative airspace disease. No pleural effusions. No evidence of pulmonary edema. Heart size is normal. Upper mediastinal contours are within normal limits. Sclerosis in the visualized portions of the left humeral head presumably indicative of avascular necrosis. IMPRESSION: 1. New small nodular density in the right mid lung, presumably infectious or inflammatory given the rapid development compared to the prior study. Chest x-ray is otherwise unremarkable. Electronically Signed   By: Trudie Reed M.D.   On: 02/08/2015 19:40   Dg Chest 2 View  01/29/2015  CLINICAL DATA:  Per pt, states sickle cell pain for 3-4  days-right side chest radiating to back EXAM: CHEST  2 VIEW COMPARISON:  10/12/2014 FINDINGS: Lungs are hyperinflated. Heart size is normal. Scarring identified in the right lung is stable. There are no focal consolidations or pleural effusions. IMPRESSION: Stable appearance of the chest . Electronically Signed   By: Norva Pavlov M.D.   On: 01/29/2015 17:19   Dg Shoulder Right  02/08/2015  CLINICAL DATA:  Right shoulder pain radiating down the right arm EXAM: RIGHT SHOULDER - 2+ VIEW COMPARISON:  10/24/2013 FINDINGS: There is no evidence of fracture or dislocation. There is no evidence of arthropathy or other focal bone abnormality. Patchy osteosclerosis consistent with patient's history of sickle cell anemia. Soft tissues are unremarkable. IMPRESSION: No acute osseous injury of the right shoulder. Electronically Signed   By: Elige Ko   On: 02/08/2015 21:36   Ct Head Wo Contrast  02/08/2015  CLINICAL DATA:  Ribcage pain. Right shoulder pain radiating down the right arm. Right hand numbness and tingling for 2 days. No injury. EXAM: CT HEAD WITHOUT CONTRAST TECHNIQUE: Contiguous axial images were obtained from the base of the skull through the vertex without intravenous contrast. COMPARISON:  None. FINDINGS: Ventricles and sulci appear symmetrical. No ventricular dilatation. No mass effect or midline shift. No abnormal extra-axial fluid collections. Gray-white matter junctions are distinct. Basal cisterns are not effaced. No evidence of acute intracranial hemorrhage. No depressed skull fractures. Visualized paranasal sinuses and mastoid air cells are not opacified. IMPRESSION: No acute intracranial abnormalities. Electronically Signed   By: Burman Nieves M.D.   On: 02/08/2015 22:50   Ct Angio Chest Pe W/cm &/or Wo Cm  01/30/2015  CLINICAL DATA:  Chest pain. EXAM: CT ANGIOGRAPHY CHEST WITH CONTRAST TECHNIQUE: Multidetector CT imaging of the chest was performed using the standard protocol during  bolus administration of intravenous contrast. Multiplanar CT image reconstructions and MIPs were obtained to evaluate the vascular anatomy. CONTRAST:  OMNIPAQUE IOHEXOL 350 MG/ML SOLN COMPARISON:  CT scan of April 28, 2010. FINDINGS: Sclerotic densities are noted throughout the spine consistent with a history of sickle cell disease. No pneumothorax or pleural effusion is noted. Stable subpleural blebs are noted in the right lower lobe. Stable scarring is noted in both lung bases. No acute pulmonary disease is noted. There is no evidence of thoracic aortic dissection or aneurysm. There is no evidence of pulmonary embolus. No significant mediastinal mass or adenopathy is noted. Visualized portion of upper abdomen is unremarkable. Review of the MIP images confirms the above findings. IMPRESSION: No evidence of pulmonary embolus. Stable subpleural blebs are noted in right lung base with scarring seen in both lung bases. No acute abnormality is noted in the chest. Electronically Signed   By: Lupita Raider, M.D.   On: 01/30/2015 08:55   Mr Brain Wo Contrast  02/09/2015  CLINICAL DATA:  Right hand weakness. Right shoulder and arm pain and numbness and tingling. Sickle cell crisis. EXAM: MRI HEAD WITHOUT CONTRAST MRI CERVICAL SPINE WITHOUT CONTRAST TECHNIQUE: Multiplanar, multiecho pulse sequences of the brain and surrounding structures, and cervical spine, to include the craniocervical junction and cervicothoracic junction, were obtained without intravenous contrast. COMPARISON:  CT head 02/08/2015 FINDINGS: MRI HEAD FINDINGS Negative for acute infarct. Small hyperintensities in the subcortical white matter bilaterally most consistent with chronic microvascular ischemia, especially given the history of sickle cell crisis. Dilated perivascular spaces right parietal white matter. Mild chronic ischemia in the pons. Ventricle size normal.  Cerebral volume normal Negative for hemorrhage.  No mass or edema. Empty  sella with a small pituitary and mildly enlarged sella. Paranasal sinuses are clear.  Normal orbit. MRI CERVICAL SPINE FINDINGS Normal cervical alignment. Negative for fracture or mass lesion. Spinal cord signal normal. No cord compression or cord lesion. Bone marrow is diffusely low signal on T2 imaging suggesting cellular marrow. C2-3:  Small central disc protrusion without stenosis C3-4: Small central disc protrusion. Mild uncinate spurring bilaterally with mild foraminal narrowing bilaterally. C4-5:  Small central disc protrusion without spinal stenosis C5-6: Small central disc protrusion with cord flattening and mild spinal stenosis. C6-7: Mild facet degeneration bilaterally. Mild foraminal narrowing bilaterally. Small left-sided disc protrusion. Mild spinal stenosis left greater than right. C7-T1: Small left paracentral disc protrusion. IMPRESSION: Negative for acute cerebral infarction. Mild chronic microvascular ischemic change consistent with sickle cell. Multilevel cervical degenerative change with multiple small disc protrusions. Mild spinal stenosis at C5-6 and C6-7. Diffusely abnormal bone marrow in the cervical spine consistent with chronic anemia. Electronically Signed   By: Marlan Palauharles  Clark M.D.   On: 02/09/2015 10:23   Mr Cervical Spine Wo Contrast  02/09/2015  CLINICAL DATA:  Right hand weakness. Right shoulder and arm pain and numbness and tingling. Sickle cell crisis. EXAM: MRI HEAD WITHOUT CONTRAST MRI CERVICAL SPINE WITHOUT CONTRAST TECHNIQUE: Multiplanar, multiecho pulse sequences of the brain and surrounding structures, and cervical spine, to include the craniocervical junction and cervicothoracic junction, were obtained without intravenous contrast. COMPARISON:  CT head 02/08/2015 FINDINGS: MRI HEAD FINDINGS Negative for acute infarct. Small hyperintensities in the subcortical white matter bilaterally most consistent with chronic microvascular ischemia, especially given the history of  sickle cell crisis. Dilated perivascular spaces right parietal white matter. Mild chronic ischemia in the pons. Ventricle size normal.  Cerebral volume normal Negative for hemorrhage.  No mass or edema. Empty sella with a small pituitary and mildly enlarged sella. Paranasal sinuses are clear.  Normal orbit. MRI CERVICAL SPINE FINDINGS Normal cervical alignment. Negative for fracture or mass lesion. Spinal cord signal normal. No cord compression or cord lesion. Bone marrow is diffusely low signal on T2 imaging suggesting cellular marrow. C2-3:  Small central disc protrusion without stenosis C3-4: Small central disc protrusion. Mild uncinate spurring bilaterally with mild foraminal narrowing bilaterally. C4-5:  Small central disc protrusion without spinal stenosis C5-6: Small central disc protrusion with cord flattening and mild spinal stenosis. C6-7: Mild facet degeneration bilaterally. Mild foraminal narrowing bilaterally. Small left-sided disc protrusion. Mild spinal stenosis left greater than right. C7-T1: Small left paracentral disc protrusion. IMPRESSION: Negative for acute cerebral infarction. Mild chronic microvascular ischemic change consistent with sickle cell. Multilevel cervical degenerative change with multiple small disc protrusions. Mild spinal stenosis at C5-6 and C6-7. Diffusely abnormal bone marrow in the cervical spine consistent with chronic anemia. Electronically Signed   By: Marlan Palauharles  Clark M.D.   On: 02/09/2015  10:23    Scheduled Meds: . cefTAZidime (FORTAZ)  IV  2 g Intravenous 3 times per day  . enoxaparin (LOVENOX) injection  40 mg Subcutaneous QHS  . folic acid  1 mg Oral Daily  . HYDROmorphone PCA 2 mg/mL   Intravenous 6 times per day  . morphine  60 mg Oral BID  . senna-docusate  1 tablet Oral BID  . vancomycin  1,000 mg Intravenous Q8H   Continuous Infusions: . dextrose 5 % and 0.45% NaCl 100 mL/hr at 02/09/15 0031    Active Problems:   Acute chest syndrome (HCC)   Sickle  cell pain crisis (HCC)   Chest pain   Sickle cell crisis acute chest syndrome (HCC)   HCAP (healthcare-associated pneumonia)   Shoulder pain, right   Assessment/Plan: Active Problems:   Acute chest syndrome (HCC)   Sickle cell pain crisis (HCC)   Chest pain   Sickle cell crisis acute chest syndrome (HCC)   HCAP (healthcare-associated pneumonia)   Shoulder pain, right  1)Sickle Cell Crisis: Hgb [place]/opioid tolerant. Pt has pain consistent with VOC.  Will redose pt's Dilaudid PCA to be set at 0.7mg  q10 min, with lockout of /hr.  Stop ibuprofen and start Toradol  q6h. Will continue MS contin  q12h  for long acting control.   2)?HCAP: pt ad a small area of consolidation in his right mid lung. He has been without fevers and his rib pain is likely causing him to not take a deep breath thus are on CXR is likely atelectasis. He has some hypoxic readings to 93% this morning. He was placed on Vanc/Ceftazidime for possible HCAP (recent admission). Will monitor for now on antibiotics. If he remains afebrile and hypoxemia improves, I will presume atelectasis and stop antibiotics tomorrow.  3) Acute shoulder pain with signs of neuropathy: Pt complains of arm and hand tingling in addition to acute shoulder pain. CT of his head did not show an acute CVA. MRI of shoulder and cervical spine are pending. Will follow-up.  4) Anemia: Hgb of 9.6 which is around his baseline of 10. Will continue to monitor. Transfuse if Hgb less than <7 or symptomatic  5)Sickle Cell Care: Continue Folic acid  6) FEN/GI : Regular Diet   IV fluids per protocol-D5/0.45% /hr Bowel regimen in place   Code Status: Full  DVT Prophylaxis: enoxaparin  Family Communication: none  Disposition Plan: none  Time spent:29min  Serina Cowper  Pager [phone]. If 7PM-7AM, please contact night-coverage.  02/09/2015, 11:56 AM  LOS: 1 day   Serina Cowper

## 2015-02-10 DIAGNOSIS — D5701 Hb-SS disease with acute chest syndrome: Secondary | ICD-10-CM

## 2015-02-10 DIAGNOSIS — R202 Paresthesia of skin: Secondary | ICD-10-CM

## 2015-02-10 DIAGNOSIS — M25511 Pain in right shoulder: Secondary | ICD-10-CM

## 2015-02-10 DIAGNOSIS — J189 Pneumonia, unspecified organism: Secondary | ICD-10-CM

## 2015-02-10 DIAGNOSIS — M6289 Other specified disorders of muscle: Secondary | ICD-10-CM

## 2015-02-10 LAB — TROPONIN I: Troponin I: <0.03 ng/mL (ref <0.031)

## 2015-02-10 LAB — CBC WITH DIFFERENTIAL/PLATELET
BASOS ABS: 0.1 10*3/uL (ref 0.0–0.1)
Basophils Relative: 1 %
EOS ABS: 1 10*3/uL — AB (ref 0.0–0.7)
EOS PCT: 8 %
HCT: 28.8 % — ABNORMAL LOW (ref 39.0–52.0)
Hemoglobin: 10.3 g/dL — ABNORMAL LOW (ref 13.0–17.0)
LYMPHS PCT: 27 %
Lymphs Abs: 3.3 10*3/uL (ref 0.7–4.0)
MCH: 33.2 pg (ref 26.0–34.0)
MCHC: 35.8 g/dL (ref 30.0–36.0)
MCV: 92.9 fL (ref 78.0–100.0)
MONO ABS: 1.4 10*3/uL — AB (ref 0.1–1.0)
Monocytes Relative: 11 %
Neutro Abs: 6.4 10*3/uL (ref 1.7–7.7)
Neutrophils Relative %: 53 %
PLATELETS: 274 10*3/uL (ref 150–400)
RBC: 3.1 MIL/uL — ABNORMAL LOW (ref 4.22–5.81)
RDW: 14.5 % (ref 11.5–15.5)
WBC: 12.1 10*3/uL — ABNORMAL HIGH (ref 4.0–10.5)

## 2015-02-10 LAB — STREP PNEUMONIAE URINARY ANTIGEN: Strep Pneumo Urinary Antigen: NEGATIVE

## 2015-02-10 MED ORDER — LACTULOSE 10 GM/15ML PO SOLN
20.0000 g | Freq: Every day | ORAL | Status: DC | PRN
  Administered 2015-02-12 – 2015-02-13 (×2): 20 g via ORAL
  Filled 2015-02-10 (×2): qty 30

## 2015-02-10 NOTE — Progress Notes (Signed)
Utilization review completed.  

## 2015-02-10 NOTE — Progress Notes (Signed)
Patient ID: Malik PortHoward E Nyce, male   DOB: Nov 16, 1975, 39 y.o.   MRN: [id number] SICKLE CELL SERVICE PROGRESS NOTE  [name] WUJ:[id number] DOB: Nov 16, 1975 DOA: 02/08/2015 PCP: Dorrene GermanAVBUERE,EDWIN A, MD    Consultants:  None  Procedures:  none  Antibiotics:  Vanc/Ceftazidime 02/09/15-02/10/15  HPI/Subjective: Pt's states that he is still having a lot of pain. He rates it as a 7-8/10. He is feeling constipated and Sennakot has not helped. Pt is requesting lactulose. Denies any cough or fever.  Objective: Filed Vitals:   02/10/15 0546 02/10/15 0810 02/10/15 1033 02/10/15 1220  BP: 101/65  132/74   Pulse: 73  60   Temp: 98.2 F (36.8 C)  98.1 F (36.7 C)   TempSrc: Oral  Oral   Resp: 16 10 14 14   Height:      Weight:      SpO2: 99% 99% 97% 97%   Weight change: 3 lb 12.8 oz (1.724 kg)  Intake/Output Summary (Last 24 hours) at 02/10/15 1226 Last data filed at 02/10/15 0527  Gross per 24 hour  Intake 3744.17 ml  Output   1200 ml  Net 2544.17 ml    General: Alert, awake, oriented x3, in no acute distress.  HEENT: [place]/AT PEERL, EOMI Neck: Trachea midline,  no masses or lymphadenopathy OROPHARYNX:  Moist, No exudate/ erythema/lesions.  Heart: Regular rate and rhythm, without murmurs, rubs, gallops Lungs: Clear to auscultation, no wheezing or rhonchi noted.  Abdomen: Soft, nontender, nondistended, positive bowel sounds, no masses no hepatosplenomegaly noted. Musculoskeletal: No warmth, swelling or erythema around joints, no spinal tenderness noted. FROM of b/l shoulders. Extremities: no clubbing, cyanosis, or edema  Data Reviewed: Basic Metabolic Panel:  Recent Labs Lab 02/08/15 1943 02/09/15 0739  NA 139 135  K 3.9 3.5  CL 108 102  CO2 26 27  GLUCOSE 76 106*  BUN 13 10  CREATININE 0.81 0.77  CALCIUM 9.1 9.0  MG  --  2.0  PHOS  --  5.2*   Liver Function Tests:  Recent Labs Lab 02/08/15 1943 02/09/15 0739  AST 27 21  ALT 19 16*  ALKPHOS 75 65  BILITOT  2.5* 2.7*  PROT 8.2* 7.2  ALBUMIN 4.4 4.0   No results for input(s): LIPASE, AMYLASE in the last 168 hours. No results for input(s): AMMONIA in the last 168 hours. CBC:  Recent Labs Lab 02/08/15 1943 02/09/15 0739 02/10/15 0608  WBC 10.4 9.9 12.1*  NEUTROABS 5.7 4.2 6.4  HGB 10.9* 9.6* 10.3*  HCT 29.8* 27.5* 28.8*  MCV 93.7 92.9 92.9  PLT 307 277 274   Cardiac Enzymes:  Recent Labs Lab 02/09/15 0739 02/09/15 1210 02/09/15 1833 02/10/15 0012 02/10/15 0608  TROPONINI <0.03 <0.03 <0.03 <0.03 <0.03    Studies: Dg Chest 2 View  02/08/2015  CLINICAL DATA:  39 year old male with history of right-sided chest pain and rib cage pain and some right shoulder pain radiating down the right arm into the right hand with associated numbness and tingling for the past 2 days. History of sickle cell disease. EXAM: CHEST  2 VIEW COMPARISON:  Chest x-ray 01/29/2015.  Chest CT 01/30/2015. FINDINGS: New 1.6 cm nodular density in the right mid lung appreciated only on the frontal projection. No acute consolidative airspace disease. No pleural effusions. No evidence of pulmonary edema. Heart size is normal. Upper mediastinal contours are within normal limits. Sclerosis in the visualized portions of the left humeral head presumably indicative of avascular necrosis. IMPRESSION: 1. New small nodular density in  the right mid lung, presumably infectious or inflammatory given the rapid development compared to the prior study. Chest x-ray is otherwise unremarkable. Electronically Signed   By: Trudie Reed M.D.   On: 02/08/2015 19:40   Dg Chest 2 View  01/29/2015  CLINICAL DATA:  Per pt, states sickle cell pain for 3-4 days-right side chest radiating to back EXAM: CHEST  2 VIEW COMPARISON:  10/12/2014 FINDINGS: Lungs are hyperinflated. Heart size is normal. Scarring identified in the right lung is stable. There are no focal consolidations or pleural effusions. IMPRESSION: Stable appearance of the chest .  Electronically Signed   By: Norva Pavlov M.D.   On: 01/29/2015 17:19   Dg Shoulder Right  02/08/2015  CLINICAL DATA:  Right shoulder pain radiating down the right arm EXAM: RIGHT SHOULDER - 2+ VIEW COMPARISON:  10/24/2013 FINDINGS: There is no evidence of fracture or dislocation. There is no evidence of arthropathy or other focal bone abnormality. Patchy osteosclerosis consistent with patient's history of sickle cell anemia. Soft tissues are unremarkable. IMPRESSION: No acute osseous injury of the right shoulder. Electronically Signed   By: Elige Ko   On: 02/08/2015 21:36   Ct Head Wo Contrast  02/08/2015  CLINICAL DATA:  Ribcage pain. Right shoulder pain radiating down the right arm. Right hand numbness and tingling for 2 days. No injury. EXAM: CT HEAD WITHOUT CONTRAST TECHNIQUE: Contiguous axial images were obtained from the base of the skull through the vertex without intravenous contrast. COMPARISON:  None. FINDINGS: Ventricles and sulci appear symmetrical. No ventricular dilatation. No mass effect or midline shift. No abnormal extra-axial fluid collections. Gray-white matter junctions are distinct. Basal cisterns are not effaced. No evidence of acute intracranial hemorrhage. No depressed skull fractures. Visualized paranasal sinuses and mastoid air cells are not opacified. IMPRESSION: No acute intracranial abnormalities. Electronically Signed   By: Burman Nieves M.D.   On: 02/08/2015 22:50   Ct Angio Chest Pe W/cm &/or Wo Cm  01/30/2015  CLINICAL DATA:  Chest pain. EXAM: CT ANGIOGRAPHY CHEST WITH CONTRAST TECHNIQUE: Multidetector CT imaging of the chest was performed using the standard protocol during bolus administration of intravenous contrast. Multiplanar CT image reconstructions and MIPs were obtained to evaluate the vascular anatomy. CONTRAST:  OMNIPAQUE IOHEXOL 350 MG/ML SOLN COMPARISON:  CT scan of April 28, 2010. FINDINGS: Sclerotic densities are noted throughout the spine  consistent with a history of sickle cell disease. No pneumothorax or pleural effusion is noted. Stable subpleural blebs are noted in the right lower lobe. Stable scarring is noted in both lung bases. No acute pulmonary disease is noted. There is no evidence of thoracic aortic dissection or aneurysm. There is no evidence of pulmonary embolus. No significant mediastinal mass or adenopathy is noted. Visualized portion of upper abdomen is unremarkable. Review of the MIP images confirms the above findings. IMPRESSION: No evidence of pulmonary embolus. Stable subpleural blebs are noted in right lung base with scarring seen in both lung bases. No acute abnormality is noted in the chest. Electronically Signed   By: Lupita Raider, M.D.   On: 01/30/2015 08:55   Mr Brain Wo Contrast  02/09/2015  CLINICAL DATA:  Right hand weakness. Right shoulder and arm pain and numbness and tingling. Sickle cell crisis. EXAM: MRI HEAD WITHOUT CONTRAST MRI CERVICAL SPINE WITHOUT CONTRAST TECHNIQUE: Multiplanar, multiecho pulse sequences of the brain and surrounding structures, and cervical spine, to include the craniocervical junction and cervicothoracic junction, were obtained without intravenous contrast. COMPARISON:  CT  head 02/08/2015 FINDINGS: MRI HEAD FINDINGS Negative for acute infarct. Small hyperintensities in the subcortical white matter bilaterally most consistent with chronic microvascular ischemia, especially given the history of sickle cell crisis. Dilated perivascular spaces right parietal white matter. Mild chronic ischemia in the pons. Ventricle size normal.  Cerebral volume normal Negative for hemorrhage.  No mass or edema. Empty sella with a small pituitary and mildly enlarged sella. Paranasal sinuses are clear.  Normal orbit. MRI CERVICAL SPINE FINDINGS Normal cervical alignment. Negative for fracture or mass lesion. Spinal cord signal normal. No cord compression or cord lesion. Bone marrow is diffusely low signal on  T2 imaging suggesting cellular marrow. C2-3:  Small central disc protrusion without stenosis C3-4: Small central disc protrusion. Mild uncinate spurring bilaterally with mild foraminal narrowing bilaterally. C4-5:  Small central disc protrusion without spinal stenosis C5-6: Small central disc protrusion with cord flattening and mild spinal stenosis. C6-7: Mild facet degeneration bilaterally. Mild foraminal narrowing bilaterally. Small left-sided disc protrusion. Mild spinal stenosis left greater than right. C7-T1: Small left paracentral disc protrusion. IMPRESSION: Negative for acute cerebral infarction. Mild chronic microvascular ischemic change consistent with sickle cell. Multilevel cervical degenerative change with multiple small disc protrusions. Mild spinal stenosis at C5-6 and C6-7. Diffusely abnormal bone marrow in the cervical spine consistent with chronic anemia. Electronically Signed   By: Marlan Palau M.D.   On: 02/09/2015 10:23   Mr Cervical Spine Wo Contrast  02/09/2015  CLINICAL DATA:  Right hand weakness. Right shoulder and arm pain and numbness and tingling. Sickle cell crisis. EXAM: MRI HEAD WITHOUT CONTRAST MRI CERVICAL SPINE WITHOUT CONTRAST TECHNIQUE: Multiplanar, multiecho pulse sequences of the brain and surrounding structures, and cervical spine, to include the craniocervical junction and cervicothoracic junction, were obtained without intravenous contrast. COMPARISON:  CT head 02/08/2015 FINDINGS: MRI HEAD FINDINGS Negative for acute infarct. Small hyperintensities in the subcortical white matter bilaterally most consistent with chronic microvascular ischemia, especially given the history of sickle cell crisis. Dilated perivascular spaces right parietal white matter. Mild chronic ischemia in the pons. Ventricle size normal.  Cerebral volume normal Negative for hemorrhage.  No mass or edema. Empty sella with a small pituitary and mildly enlarged sella. Paranasal sinuses are clear.   Normal orbit. MRI CERVICAL SPINE FINDINGS Normal cervical alignment. Negative for fracture or mass lesion. Spinal cord signal normal. No cord compression or cord lesion. Bone marrow is diffusely low signal on T2 imaging suggesting cellular marrow. C2-3:  Small central disc protrusion without stenosis C3-4: Small central disc protrusion. Mild uncinate spurring bilaterally with mild foraminal narrowing bilaterally. C4-5:  Small central disc protrusion without spinal stenosis C5-6: Small central disc protrusion with cord flattening and mild spinal stenosis. C6-7: Mild facet degeneration bilaterally. Mild foraminal narrowing bilaterally. Small left-sided disc protrusion. Mild spinal stenosis left greater than right. C7-T1: Small left paracentral disc protrusion. IMPRESSION: Negative for acute cerebral infarction. Mild chronic microvascular ischemic change consistent with sickle cell. Multilevel cervical degenerative change with multiple small disc protrusions. Mild spinal stenosis at C5-6 and C6-7. Diffusely abnormal bone marrow in the cervical spine consistent with chronic anemia. Electronically Signed   By: Marlan Palau M.D.   On: 02/09/2015 10:23    Scheduled Meds: . enoxaparin (LOVENOX) injection  40 mg Subcutaneous QHS  . folic acid  1 mg Oral Daily  . HYDROmorphone PCA 2 mg/mL   Intravenous 6 times per day  . ketorolac  30 mg Intravenous 4 times per day  . morphine  60 mg Oral  BID  . senna-docusate  1 tablet Oral BID   Continuous Infusions:    Active Problems:   Acute chest syndrome (HCC)   Sickle cell pain crisis (HCC)   Chest pain   Sickle cell crisis acute chest syndrome (HCC)   HCAP (healthcare-associated pneumonia)   Shoulder pain, right   Assessment/Plan: Active Problems:   Acute chest syndrome (HCC)   Sickle cell pain crisis (HCC)   Chest pain   Sickle cell crisis acute chest syndrome (HCC)   HCAP (healthcare-associated pneumonia)   Shoulder pain, right  1)Sickle Cell  Crisis: Hgb [place]/opioid tolerant. Pt has pain consistent with VOC that he rates is still severe. Continue Dilaudid PCA at 0.7mg  q10 min.  Continue Toradol  q6h. Will continue MS contin  q12h  for long acting control.   2)?HCAP: pt had a small area of consolidation in his right mid lung. He has been without fevers and his rib pain is likely causing him to not take a deep breath thus are on CXR is likely atelectasis. He had brief hypoxic reading on admission. He was placed on Vanc/Ceftazidime for possible HCAP (recent admission). Since he remained afebrile, hypoxemia resolved, and he is without symptoms, I will presume atelectasis and stop antibiotics. Monitor for now.  3) Acute shoulder pain with signs of neuropathy: Pt complains of arm and hand tingling in addition to acute shoulder pain. CT of his head did not show an acute CVA. MRI of shoulder and cervical spine show spinal stenosis from C5-C7. Consider outpatient PT and/or neurosurgery referral.  4) Anemia: Hgb of 10 this AM which is around his baseline. Will continue to monitor. Transfuse if Hgb less than <7 or symptomatic  5)Sickle Cell Care: Continue Folic acid  6) FEN/GI : Regular Diet  IV fluids-none Bowel regimen in place   Code Status: Full  DVT Prophylaxis: enoxaparin  Family Communication: none  Disposition Plan: none  Time spent:  Serina Cowper  Pager [phone]. If 7PM-7AM, please contact night-coverage.  02/10/2015, 12:26 PM  LOS: 2 days   Serina Cowper

## 2015-02-11 LAB — LEGIONELLA PNEUMOPHILA SEROGP 1 UR AG: L. PNEUMOPHILA SEROGP 1 UR AG: NEGATIVE

## 2015-02-11 LAB — TROPONIN I: Troponin I: <0.03 ng/mL (ref <0.031)

## 2015-02-11 MED ORDER — DIPHENHYDRAMINE HCL 12.5 MG/5ML PO ELIX: 25.0000 mg | ORAL_SOLUTION | Freq: Four times a day (QID) | ORAL | Status: DC | PRN

## 2015-02-11 MED ORDER — DIPHENHYDRAMINE HCL 50 MG/ML IJ SOLN
25.0000 mg | Freq: Four times a day (QID) | INTRAMUSCULAR | Status: DC | PRN
  Filled 2015-02-11: qty 0.5

## 2015-02-11 MED ORDER — NALOXONE HCL 0.4 MG/ML IJ SOLN: 0.4000 mg | INTRAMUSCULAR | Status: DC | PRN

## 2015-02-11 MED ORDER — MORPHINE SULFATE 30 MG PO TABS
60.0000 mg | ORAL_TABLET | ORAL | Status: DC
  Administered 2015-02-12 – 2015-02-13 (×8): 60 mg via ORAL
  Filled 2015-02-11 (×9): qty 2

## 2015-02-11 MED ORDER — SODIUM CHLORIDE 0.9 % IJ SOLN: 9.0000 mL | INTRAMUSCULAR | Status: DC | PRN

## 2015-02-11 MED ORDER — HYDROMORPHONE 1 MG/ML IV SOLN
INTRAVENOUS | Status: DC
  Administered 2015-02-11: 6.3 mg via INTRAVENOUS
  Administered 2015-02-11: 11.2 mg via INTRAVENOUS
  Administered 2015-02-12 (×2): 4.2 mg via INTRAVENOUS
  Administered 2015-02-12: 5.46 mg via INTRAVENOUS
  Administered 2015-02-12: 3.5 mg via INTRAVENOUS
  Administered 2015-02-12: 4.9 mg via INTRAVENOUS
  Administered 2015-02-12: 2.1 mg via INTRAVENOUS
  Administered 2015-02-13: 0.7 mg via INTRAVENOUS
  Administered 2015-02-13: 6.5 mg via INTRAVENOUS
  Administered 2015-02-13: 3.5 mg via INTRAVENOUS
  Administered 2015-02-13: 03:00:00 via INTRAVENOUS
  Filled 2015-02-11 (×2): qty 25

## 2015-02-11 MED ORDER — PROMETHAZINE HCL 25 MG PO TABS
25.0000 mg | ORAL_TABLET | Freq: Four times a day (QID) | ORAL | Status: DC | PRN
  Administered 2015-02-12 – 2015-02-13 (×2): 25 mg via ORAL
  Filled 2015-02-11 (×2): qty 1

## 2015-02-11 MED ORDER — SODIUM CHLORIDE 0.9 % IV SOLN
12.5000 mg | Freq: Four times a day (QID) | INTRAVENOUS | Status: DC | PRN
  Filled 2015-02-11: qty 0.25

## 2015-02-11 MED ORDER — HYDROMORPHONE 1 MG/ML IV SOLN
INTRAVENOUS | Status: DC
  Administered 2015-02-11: 2.1 mg via INTRAVENOUS
  Administered 2015-02-11: 11:00:00 via INTRAVENOUS
  Filled 2015-02-11 (×2): qty 25

## 2015-02-11 MED ORDER — DIPHENHYDRAMINE HCL 12.5 MG/5ML PO ELIX: 12.5000 mg | ORAL_SOLUTION | Freq: Four times a day (QID) | ORAL | Status: DC | PRN

## 2015-02-11 NOTE — Progress Notes (Signed)
SICKLE CELL SERVICE PROGRESS NOTE  [name] WUJ:[id number] DOB: 22-Jun-1975 DOA: 02/08/2015 PCP: Dorrene German, MD  Assessment/Plan: Active Problems:   Acute chest syndrome (HCC)   Sickle cell pain crisis (HCC)   Chest pain   Sickle cell crisis acute chest syndrome (HCC)   HCAP (healthcare-associated pneumonia)   Shoulder pain, right  1. Hb  with crisis:Pt is doing much better today and has used only 21.7 mg on the PCA for the last 24 hours. I will schedule short acting Morphine Sulfate and he can refuse if no pain. I will continue PCA for PRN use and Toradol.  2. Leukocytosis: Pt has a mild leukocytosis consistent with acute crisis. He has no evidence of infection. Additionally the finding on CXR was visible only on one view and after discussion with Radiologist I agree with Dr. Nathanial Millman that he does not exhibit findings consistent with Pneumonia or acute chest syndrome. 3. Anemia of chronic disease: Hb at baseline. 4. Chronic pain: Continue MS Contin.  Code Status: Full Code Family Communication: N/A Disposition Plan: Anticipate discharge in next 24-48 hours.  Angelise Petrich A.  Pager [phone]. If 7PM-7AM, please contact night-coverage.  02/11/2015, 2:56 PM  LOS: 3 days    Consultants:  None  Procedures:  None  Antibiotics:  Azithromycin 10/14 >> 10/14  Fortaz 10/14 >>10/16  HPI/Subjective: Pt rates pain as 7/10. States that he feels much better today. Up ambulating without difficulty.  Objective: Filed Vitals:   02/11/15 0833 02/11/15 1100 02/11/15 1220 02/11/15 1436  BP:  127/73  134/82  Pulse:    53  Temp:  98.4 F (36.9 C)  98.5 F (36.9 C)  TempSrc:  Oral  Oral  Resp: Height:      Weight:      SpO2:   96% 99%   Weight change:   Intake/Output Summary (Last 24 hours) at 02/11/15 1456 Last data filed at 02/11/15 0557  Gross per 24 hour  Intake   19.3 ml  Output   1450 ml  Net -1430.7 ml    General: Alert, awake, oriented  x3, in no acute distress.  HEENT: [place]/AT PEERL, EOMI Neck: Trachea midline,  no masses, no thyromegal,y no JVD, no carotid bruit OROPHARYNX:  Moist, No exudate/ erythema/lesions.  Heart: Regular rate and rhythm, without murmurs, rubs, gallops.  Lungs: Clear to auscultation, no wheezing or rhonchi noted. No increased vocal fremitus resonant to percussion  Abdomen: Soft, nontender, nondistended, positive bowel sounds, no masses no hepatosplenomegaly noted.  Neuro: No focal neurological deficits noted cranial nerves II through XII grossly intact. Gait normal Musculoskeletal: No warm swelling or erythema around joints, no spinal tenderness noted. Psychiatric: Patient alert and oriented x3, good insight and cognition, good recent to remote recall.    Data Reviewed: Basic Metabolic Panel:  Recent Labs Lab 02/08/15 1943 02/09/15 0739  NA 139 135  K 3.9 3.5  CL 108 102  CO2 26 27  GLUCOSE 76 106*  BUN 13 10  CREATININE 0.81 0.77  CALCIUM 9.1 9.0  MG  --  2.0  PHOS  --  5.2*   Liver Function Tests:  Recent Labs Lab 02/08/15 1943 02/09/15 0739  AST 27 21  ALT 19 16*  ALKPHOS 75 65  BILITOT 2.5* 2.7*  PROT 8.2* 7.2  ALBUMIN 4.4 4.0   No results for input(s): LIPASE, AMYLASE in the last 168 hours. No results for input(s): AMMONIA in the last 168 hours. CBC:  Recent Labs Lab  02/08/15 1943 02/09/15 0739 02/10/15 0608  WBC 10.4 9.9 12.1*  NEUTROABS 5.7 4.2 6.4  HGB 10.9* 9.6* 10.3*  HCT 29.8* 27.5* 28.8*  MCV 93.7 92.9 92.9  PLT 307 277 274   Cardiac Enzymes:  Recent Labs Lab 02/10/15 0012 02/10/15 0608 02/10/15 1156 02/10/15 1800 02/11/15 0625  TROPONINI <0.03 <0.03 <0.03 <0.03 <0.03   BNP (last 3 results) No results for input(s): BNP in the last 8760 hours.  ProBNP (last 3 results) No results for input(s): PROBNP in the last 8760 hours.  CBG: No results for input(s): GLUCAP in the last 168 hours.  Recent Results (from the past 240 hour(s))  Culture,  blood (routine x 2)     Status: None (Preliminary result)   Collection Time: 02/08/15  8:33 PM  Result Value Ref Range Status   Specimen Description BLOOD LEFT HAND  Final   Special Requests BOTTLES DRAWN AEROBIC AND ANAEROBIC  Final   Culture   Final    NO GROWTH 3 DAYS Performed at [facility]    Report Status PENDING  Incomplete  Culture, blood (routine x 2)     Status: None (Preliminary result)   Collection Time: 02/08/15  8:33 PM  Result Value Ref Range Status   Specimen Description BLOOD RIGHT HAND  Final   Special Requests BOTTLES DRAWN AEROBIC AND ANAEROBIC  Final   Culture   Final    NO GROWTH 3 DAYS Performed at [facility]    Report Status PENDING  Incomplete     Studies: Dg Chest 2 View  02/08/2015  CLINICAL DATA:  39 year old male with history of right-sided chest pain and rib cage pain and some right shoulder pain radiating down the right arm into the right hand with associated numbness and tingling for the past 2 days. History of sickle cell disease. EXAM: CHEST  2 VIEW COMPARISON:  Chest x-ray 01/29/2015.  Chest CT 01/30/2015. FINDINGS: New 1.6 cm nodular density in the right mid lung appreciated only on the frontal projection. No acute consolidative airspace disease. No pleural effusions. No evidence of pulmonary edema. Heart size is normal. Upper mediastinal contours are within normal limits. Sclerosis in the visualized portions of the left humeral head presumably indicative of avascular necrosis. IMPRESSION: 1. New small nodular density in the right mid lung, presumably infectious or inflammatory given the rapid development compared to the prior study. Chest x-ray is otherwise unremarkable. Electronically Signed   By: Trudie Reed M.D.   On: 02/08/2015 19:40   Dg Chest 2 View  01/29/2015  CLINICAL DATA:  Per pt, states sickle cell pain for 3-4 days-right side chest radiating to back EXAM: CHEST  2 VIEW COMPARISON:  10/12/2014 FINDINGS: Lungs  are hyperinflated. Heart size is normal. Scarring identified in the right lung is stable. There are no focal consolidations or pleural effusions. IMPRESSION: Stable appearance of the chest . Electronically Signed   By: Norva Pavlov M.D.   On: 01/29/2015 17:19   Dg Shoulder Right  02/08/2015  CLINICAL DATA:  Right shoulder pain radiating down the right arm EXAM: RIGHT SHOULDER - 2+ VIEW COMPARISON:  10/24/2013 FINDINGS: There is no evidence of fracture or dislocation. There is no evidence of arthropathy or other focal bone abnormality. Patchy osteosclerosis consistent with patient's history of sickle cell anemia. Soft tissues are unremarkable. IMPRESSION: No acute osseous injury of the right shoulder. Electronically Signed   By: Elige Ko   On: 02/08/2015 21:36   Ct Head  Wo Contrast  02/08/2015  CLINICAL DATA:  Ribcage pain. Right shoulder pain radiating down the right arm. Right hand numbness and tingling for 2 days. No injury. EXAM: CT HEAD WITHOUT CONTRAST TECHNIQUE: Contiguous axial images were obtained from the base of the skull through the vertex without intravenous contrast. COMPARISON:  None. FINDINGS: Ventricles and sulci appear symmetrical. No ventricular dilatation. No mass effect or midline shift. No abnormal extra-axial fluid collections. Gray-white matter junctions are distinct. Basal cisterns are not effaced. No evidence of acute intracranial hemorrhage. No depressed skull fractures. Visualized paranasal sinuses and mastoid air cells are not opacified. IMPRESSION: No acute intracranial abnormalities. Electronically Signed   By: Burman Nieves M.D.   On: 02/08/2015 22:50   Ct Angio Chest Pe W/cm &/or Wo Cm  01/30/2015  CLINICAL DATA:  Chest pain. EXAM: CT ANGIOGRAPHY CHEST WITH CONTRAST TECHNIQUE: Multidetector CT imaging of the chest was performed using the standard protocol during bolus administration of intravenous contrast. Multiplanar CT image reconstructions and MIPs were  obtained to evaluate the vascular anatomy. CONTRAST:  OMNIPAQUE IOHEXOL 350 MG/ML SOLN COMPARISON:  CT scan of April 28, 2010. FINDINGS: Sclerotic densities are noted throughout the spine consistent with a history of sickle cell disease. No pneumothorax or pleural effusion is noted. Stable subpleural blebs are noted in the right lower lobe. Stable scarring is noted in both lung bases. No acute pulmonary disease is noted. There is no evidence of thoracic aortic dissection or aneurysm. There is no evidence of pulmonary embolus. No significant mediastinal mass or adenopathy is noted. Visualized portion of upper abdomen is unremarkable. Review of the MIP images confirms the above findings. IMPRESSION: No evidence of pulmonary embolus. Stable subpleural blebs are noted in right lung base with scarring seen in both lung bases. No acute abnormality is noted in the chest. Electronically Signed   By: Lupita Raider, M.D.   On: 01/30/2015 08:55   Mr Brain Wo Contrast  02/09/2015  CLINICAL DATA:  Right hand weakness. Right shoulder and arm pain and numbness and tingling. Sickle cell crisis. EXAM: MRI HEAD WITHOUT CONTRAST MRI CERVICAL SPINE WITHOUT CONTRAST TECHNIQUE: Multiplanar, multiecho pulse sequences of the brain and surrounding structures, and cervical spine, to include the craniocervical junction and cervicothoracic junction, were obtained without intravenous contrast. COMPARISON:  CT head 02/08/2015 FINDINGS: MRI HEAD FINDINGS Negative for acute infarct. Small hyperintensities in the subcortical white matter bilaterally most consistent with chronic microvascular ischemia, especially given the history of sickle cell crisis. Dilated perivascular spaces right parietal white matter. Mild chronic ischemia in the pons. Ventricle size normal.  Cerebral volume normal Negative for hemorrhage.  No mass or edema. Empty sella with a small pituitary and mildly enlarged sella. Paranasal sinuses are clear.  Normal orbit.  MRI CERVICAL SPINE FINDINGS Normal cervical alignment. Negative for fracture or mass lesion. Spinal cord signal normal. No cord compression or cord lesion. Bone marrow is diffusely low signal on T2 imaging suggesting cellular marrow. C2-3:  Small central disc protrusion without stenosis C3-4: Small central disc protrusion. Mild uncinate spurring bilaterally with mild foraminal narrowing bilaterally. C4-5:  Small central disc protrusion without spinal stenosis C5-6: Small central disc protrusion with cord flattening and mild spinal stenosis. C6-7: Mild facet degeneration bilaterally. Mild foraminal narrowing bilaterally. Small left-sided disc protrusion. Mild spinal stenosis left greater than right. C7-T1: Small left paracentral disc protrusion. IMPRESSION: Negative for acute cerebral infarction. Mild chronic microvascular ischemic change consistent with sickle cell. Multilevel cervical degenerative change with multiple small  disc protrusions. Mild spinal stenosis at C5-6 and C6-7. Diffusely abnormal bone marrow in the cervical spine consistent with chronic anemia. Electronically Signed   By: Marlan Palauharles  Clark M.D.   On: 02/09/2015 10:23   Mr Cervical Spine Wo Contrast  02/09/2015  CLINICAL DATA:  Right hand weakness. Right shoulder and arm pain and numbness and tingling. Sickle cell crisis. EXAM: MRI HEAD WITHOUT CONTRAST MRI CERVICAL SPINE WITHOUT CONTRAST TECHNIQUE: Multiplanar, multiecho pulse sequences of the brain and surrounding structures, and cervical spine, to include the craniocervical junction and cervicothoracic junction, were obtained without intravenous contrast. COMPARISON:  CT head 02/08/2015 FINDINGS: MRI HEAD FINDINGS Negative for acute infarct. Small hyperintensities in the subcortical white matter bilaterally most consistent with chronic microvascular ischemia, especially given the history of sickle cell crisis. Dilated perivascular spaces right parietal white matter. Mild chronic ischemia in  the pons. Ventricle size normal.  Cerebral volume normal Negative for hemorrhage.  No mass or edema. Empty sella with a small pituitary and mildly enlarged sella. Paranasal sinuses are clear.  Normal orbit. MRI CERVICAL SPINE FINDINGS Normal cervical alignment. Negative for fracture or mass lesion. Spinal cord signal normal. No cord compression or cord lesion. Bone marrow is diffusely low signal on T2 imaging suggesting cellular marrow. C2-3:  Small central disc protrusion without stenosis C3-4: Small central disc protrusion. Mild uncinate spurring bilaterally with mild foraminal narrowing bilaterally. C4-5:  Small central disc protrusion without spinal stenosis C5-6: Small central disc protrusion with cord flattening and mild spinal stenosis. C6-7: Mild facet degeneration bilaterally. Mild foraminal narrowing bilaterally. Small left-sided disc protrusion. Mild spinal stenosis left greater than right. C7-T1: Small left paracentral disc protrusion. IMPRESSION: Negative for acute cerebral infarction. Mild chronic microvascular ischemic change consistent with sickle cell. Multilevel cervical degenerative change with multiple small disc protrusions. Mild spinal stenosis at C5-6 and C6-7. Diffusely abnormal bone marrow in the cervical spine consistent with chronic anemia. Electronically Signed   By: Marlan Palauharles  Clark M.D.   On: 02/09/2015 10:23    Scheduled Meds: . enoxaparin (LOVENOX) injection  40 mg Subcutaneous QHS  . folic acid  1 mg Oral Daily  . HYDROmorphone   Intravenous 6 times per day  . ketorolac  30 mg Intravenous 4 times per day  . morphine  60 mg Oral BID  . senna-docusate  1 tablet Oral BID   Time spent 35 minutes

## 2015-02-11 NOTE — Care Management Important Message (Signed)
Important Message  Patient Details  Name: [name] MRN: [id number] Date of Birth: December 14, 1975   Medicare Important Message Given:  Yes-second notification given    Haskell FlirtJamison, Yacoub Diltz 02/11/2015, 11:45 AMImportant Message  Patient Details  Name: [name] MRN: [id number] Date of Birth: December 14, 1975   Medicare Important Message Given:  Yes-second notification given    Haskell FlirtJamison, Lindsay Soulliere 02/11/2015, 11:45 AM

## 2015-02-11 NOTE — Progress Notes (Signed)
Wasted 2ml of 2mg /201ml concentration Dilaudid in sink - witnessed by Bayard HuggerSteve Clarita Mcelvain RN

## 2015-02-12 LAB — CBC WITH DIFFERENTIAL/PLATELET
BASOS PCT: 1 %
Basophils Absolute: 0.1 10*3/uL (ref 0.0–0.1)
EOS ABS: 1.2 10*3/uL — AB (ref 0.0–0.7)
EOS PCT: 11 %
HEMATOCRIT: 27.2 % — AB (ref 39.0–52.0)
Hemoglobin: 9.9 g/dL — ABNORMAL LOW (ref 13.0–17.0)
LYMPHS ABS: 2.9 10*3/uL (ref 0.7–4.0)
Lymphocytes Relative: 27 %
MCH: 33.7 pg (ref 26.0–34.0)
MCHC: 36.4 g/dL — AB (ref 30.0–36.0)
MCV: 92.5 fL (ref 78.0–100.0)
MONO ABS: 1.2 10*3/uL — AB (ref 0.1–1.0)
Monocytes Relative: 11 %
Neutro Abs: 5.4 10*3/uL (ref 1.7–7.7)
Neutrophils Relative %: 50 %
PLATELETS: 295 10*3/uL (ref 150–400)
RBC: 2.94 MIL/uL — AB (ref 4.22–5.81)
RDW: 14.3 % (ref 11.5–15.5)
WBC: 10.8 10*3/uL — AB (ref 4.0–10.5)

## 2015-02-12 LAB — BASIC METABOLIC PANEL
Anion gap: 8 (ref 5–15)
BUN: 15 mg/dL (ref 6–20)
CALCIUM: 9.2 mg/dL (ref 8.9–10.3)
CO2: 30 mmol/L (ref 22–32)
CREATININE: 0.66 mg/dL (ref 0.61–1.24)
Chloride: 103 mmol/L (ref 101–111)
GFR calc Af Amer: >60 mL/min (ref >60)
GFR calc non Af Amer: >60 mL/min (ref >60)
GLUCOSE: 96 mg/dL (ref 65–99)
Potassium: 4 mmol/L (ref 3.5–5.1)
Sodium: 141 mmol/L (ref 135–145)

## 2015-02-12 NOTE — Progress Notes (Signed)
SICKLE CELL SERVICE PROGRESS NOTE  [name] ZOX:[id number] DOB: [birth year] DOA: 02/08/2015 PCP: Malik German, MD  Assessment/Plan: Active Problems:   Acute chest syndrome (HCC)   Sickle cell pain crisis (HCC)   Chest pain   Sickle cell crisis acute chest syndrome (HCC)   HCAP (healthcare-associated pneumonia)   Shoulder pain, right  1. Hb [place] with crisis:Pt is doing much better today and has used only 21.7 mg on the PCA for the last 24 hours. I will schedule short acting Morphine Sulfate and he can refuse if no pain. I will continue PCA for PRN use and Toradol.  2. Leukocytosis: Pt has a mild leukocytosis consistent with acute crisis. He has no evidence of infection. Additionally the finding on CXR was visible only on one view and after discussion with Radiologist I agree with Dr. Nathanial [name] that he does not exhibit findings consistent with Pneumonia or acute chest syndrome. 3. Anemia of chronic disease: Hb at baseline. 4. Chronic pain: Continue MS Contin.  Code Status: Full Code Family Communication: N/A Disposition Plan: Anticipate discharge in next 24 hours.  [name] A.  Pager [phone]. If 7PM-7AM, please contact night-coverage.  02/12/2015, 2:00 PM  LOS: 4 days    Consultants:  None  Procedures:  None  Antibiotics:  Azithromycin 10/14 >> 10/14  Fortaz 10/14 >>10/16  HPI/Subjective: Pt rates pain as 7/10. States that he feels much better today. Up ambulating without difficulty.  Objective: Filed Vitals:   02/12/15 0800 02/12/15 0954 02/12/15 1151 02/12/15 1347  BP:  135/77  112/79  Pulse:  52  87  Temp:  98 F (36.7 C)  98.2 F (36.8 C)  TempSrc:  Oral  Oral  Resp: Height:      Weight:      SpO2: 92% 100% 91% 92%   Weight change:   Intake/Output Summary (Last 24 hours) at 02/12/15 1400 Last data filed at 02/12/15 0900  Gross per 24 hour  Intake      0 ml  Output   1750 ml  Net  -1750 ml    General: Alert, awake,  oriented x3, in no acute distress.  HEENT: [place]/AT PEERL, EOMI, anicteric. Neck: Trachea midline,  no masses, no thyromegal,y no JVD, no carotid bruit OROPHARYNX:  Moist, No exudate/ erythema/lesions.  Heart: Regular rate and rhythm, without murmurs, rubs, gallops.  Lungs: Clear to auscultation, no wheezing or rhonchi noted. No increased vocal fremitus resonant to percussion  Abdomen: Soft, nontender, nondistended, positive bowel sounds, no masses no hepatosplenomegaly noted.  Neuro: No focal neurological deficits noted cranial nerves II through XII grossly intact. Gait normal Musculoskeletal: No warm swelling or erythema around joints, no spinal tenderness noted. Psychiatric: Patient alert and oriented x3, good insight and cognition, good recent to remote recall.    Data Reviewed: Basic Metabolic Panel:  Recent Labs Lab 02/08/15 1943 02/09/15 0739 02/12/15 0420  NA 139 135 141  K 3.9 3.5 4.0  CL 108 102 103  CO2 GLUCOSE 76 106* 96  BUN CREATININE 0.81 0.77 0.66  CALCIUM 9.1 9.0 9.2  MG  --  2.0  --   PHOS  --  5.2*  --    Liver Function Tests:  Recent Labs Lab 02/08/15 1943 02/09/15 0739  AST 27 21  ALT 19 16*  ALKPHOS 75 65  BILITOT 2.5* 2.7*  PROT 8.2* 7.2  ALBUMIN 4.4 4.0   No results for input(s): LIPASE,  AMYLASE in the last 168 hours. No results for input(s): AMMONIA in the last 168 hours. CBC:  Recent Labs Lab 02/08/15 1943 02/09/15 0739 02/10/15 0608 02/12/15 0420  WBC 10.4 9.9 12.1* 10.8*  NEUTROABS 5.7 4.2 6.4 5.4  HGB 10.9* 9.6* 10.3* 9.9*  HCT 29.8* 27.5* 28.8* 27.2*  MCV 93.7 92.9 92.9 92.5  PLT 307 277 274 295   Cardiac Enzymes:  Recent Labs Lab 02/10/15 0012 02/10/15 0608 02/10/15 1156 02/10/15 1800 02/11/15 0625  TROPONINI <0.03 <0.03 <0.03 <0.03 <0.03   BNP (last 3 results) No results for input(s): BNP in the last 8760 hours.  ProBNP (last 3 results) No results for input(s): PROBNP in the last 8760  hours.  CBG: No results for input(s): GLUCAP in the last 168 hours.  Recent Results (from the past 240 hour(s))  Culture, blood (routine x 2)     Status: None (Preliminary result)   Collection Time: 02/08/15  8:33 PM  Result Value Ref Range Status   Specimen Description BLOOD LEFT HAND  Final   Special Requests BOTTLES DRAWN AEROBIC AND ANAEROBIC  Final   Culture   Final    NO GROWTH 4 DAYS Performed at [facility]    Report Status PENDING  Incomplete  Culture, blood (routine x 2)     Status: None (Preliminary result)   Collection Time: 02/08/15  8:33 PM  Result Value Ref Range Status   Specimen Description BLOOD RIGHT HAND  Final   Special Requests BOTTLES DRAWN AEROBIC AND ANAEROBIC  Final   Culture   Final    NO GROWTH 4 DAYS Performed at [facility]    Report Status PENDING  Incomplete     Studies: Dg Chest 2 View  02/08/2015  CLINICAL DATA:  39 year old male with history of right-sided chest pain and rib cage pain and some right shoulder pain radiating down the right arm into the right hand with associated numbness and tingling for the past 2 days. History of sickle cell disease. EXAM: CHEST  2 VIEW COMPARISON:  Chest x-ray 01/29/2015.  Chest CT 01/30/2015. FINDINGS: New 1.6 cm nodular density in the right mid lung appreciated only on the frontal projection. No acute consolidative airspace disease. No pleural effusions. No evidence of pulmonary edema. Heart size is normal. Upper mediastinal contours are within normal limits. Sclerosis in the visualized portions of the left humeral head presumably indicative of avascular necrosis. IMPRESSION: 1. New small nodular density in the right mid lung, presumably infectious or inflammatory given the rapid development compared to the prior study. Chest x-ray is otherwise unremarkable. Electronically Signed   By: [name] M.D.   On: 02/08/2015 19:40   Dg Chest 2 View  01/29/2015  CLINICAL DATA:  Per pt,  states sickle cell pain for 3-4 days-right side chest radiating to back EXAM: CHEST  2 VIEW COMPARISON:  10/12/2014 FINDINGS: Lungs are hyperinflated. Heart size is normal. Scarring identified in the right lung is stable. There are no focal consolidations or pleural effusions. IMPRESSION: Stable appearance of the chest . Electronically Signed   By: Norva Pavlov M.D.   On: 01/29/2015 17:19   Dg Shoulder Right  02/08/2015  CLINICAL DATA:  Right shoulder pain radiating down the right arm EXAM: RIGHT SHOULDER - 2+ VIEW COMPARISON:  10/24/2013 FINDINGS: There is no evidence of fracture or dislocation. There is no evidence of arthropathy or other focal bone abnormality. Patchy osteosclerosis consistent with patient's history of sickle cell anemia. Soft  tissues are unremarkable. IMPRESSION: No acute osseous injury of the right shoulder. Electronically Signed   By: Elige Ko   On: 02/08/2015 21:36   Ct Head Wo Contrast  02/08/2015  CLINICAL DATA:  Ribcage pain. Right shoulder pain radiating down the right arm. Right hand numbness and tingling for 2 days. No injury. EXAM: CT HEAD WITHOUT CONTRAST TECHNIQUE: Contiguous axial images were obtained from the base of the skull through the vertex without intravenous contrast. COMPARISON:  None. FINDINGS: Ventricles and sulci appear symmetrical. No ventricular dilatation. No mass effect or midline shift. No abnormal extra-axial fluid collections. Gray-white matter junctions are distinct. Basal cisterns are not effaced. No evidence of acute intracranial hemorrhage. No depressed skull fractures. Visualized paranasal sinuses and mastoid air cells are not opacified. IMPRESSION: No acute intracranial abnormalities. Electronically Signed   By: Burman Nieves M.D.   On: 02/08/2015 22:50   Ct Angio Chest Pe W/cm &/or Wo Cm  01/30/2015  CLINICAL DATA:  Chest pain. EXAM: CT ANGIOGRAPHY CHEST WITH CONTRAST TECHNIQUE: Multidetector CT imaging of the chest was performed using  the standard protocol during bolus administration of intravenous contrast. Multiplanar CT image reconstructions and MIPs were obtained to evaluate the vascular anatomy. CONTRAST:  OMNIPAQUE IOHEXOL 350 MG/ML SOLN COMPARISON:  CT scan of April 28, 2010. FINDINGS: Sclerotic densities are noted throughout the spine consistent with a history of sickle cell disease. No pneumothorax or pleural effusion is noted. Stable subpleural blebs are noted in the right lower lobe. Stable scarring is noted in both lung bases. No acute pulmonary disease is noted. There is no evidence of thoracic aortic dissection or aneurysm. There is no evidence of pulmonary embolus. No significant mediastinal mass or adenopathy is noted. Visualized portion of upper abdomen is unremarkable. Review of the MIP images confirms the above findings. IMPRESSION: No evidence of pulmonary embolus. Stable subpleural blebs are noted in right lung base with scarring seen in both lung bases. No acute abnormality is noted in the chest. Electronically Signed   By: Lupita Raider, M.D.   On: 01/30/2015 08:55   Mr Brain Wo Contrast  02/09/2015  CLINICAL DATA:  Right hand weakness. Right shoulder and arm pain and numbness and tingling. Sickle cell crisis. EXAM: MRI HEAD WITHOUT CONTRAST MRI CERVICAL SPINE WITHOUT CONTRAST TECHNIQUE: Multiplanar, multiecho pulse sequences of the brain and surrounding structures, and cervical spine, to include the craniocervical junction and cervicothoracic junction, were obtained without intravenous contrast. COMPARISON:  CT head 02/08/2015 FINDINGS: MRI HEAD FINDINGS Negative for acute infarct. Small hyperintensities in the subcortical white matter bilaterally most consistent with chronic microvascular ischemia, especially given the history of sickle cell crisis. Dilated perivascular spaces right parietal white matter. Mild chronic ischemia in the pons. Ventricle size normal.  Cerebral volume normal Negative for  hemorrhage.  No mass or edema. Empty sella with a small pituitary and mildly enlarged sella. Paranasal sinuses are clear.  Normal orbit. MRI CERVICAL SPINE FINDINGS Normal cervical alignment. Negative for fracture or mass lesion. Spinal cord signal normal. No cord compression or cord lesion. Bone marrow is diffusely low signal on T2 imaging suggesting cellular marrow. C2-3:  Small central disc protrusion without stenosis C3-4: Small central disc protrusion. Mild uncinate spurring bilaterally with mild foraminal narrowing bilaterally. C4-5:  Small central disc protrusion without spinal stenosis C5-6: Small central disc protrusion with cord flattening and mild spinal stenosis. C6-7: Mild facet degeneration bilaterally. Mild foraminal narrowing bilaterally. Small left-sided disc protrusion. Mild spinal stenosis left greater than  right. C7-T1: Small left paracentral disc protrusion. IMPRESSION: Negative for acute cerebral infarction. Mild chronic microvascular ischemic change consistent with sickle cell. Multilevel cervical degenerative change with multiple small disc protrusions. Mild spinal stenosis at C5-6 and C6-7. Diffusely abnormal bone marrow in the cervical spine consistent with chronic anemia. Electronically Signed   By: Marlan Palauharles  Clark M.D.   On: 02/09/2015 10:23   Mr Cervical Spine Wo Contrast  02/09/2015  CLINICAL DATA:  Right hand weakness. Right shoulder and arm pain and numbness and tingling. Sickle cell crisis. EXAM: MRI HEAD WITHOUT CONTRAST MRI CERVICAL SPINE WITHOUT CONTRAST TECHNIQUE: Multiplanar, multiecho pulse sequences of the brain and surrounding structures, and cervical spine, to include the craniocervical junction and cervicothoracic junction, were obtained without intravenous contrast. COMPARISON:  CT head 02/08/2015 FINDINGS: MRI HEAD FINDINGS Negative for acute infarct. Small hyperintensities in the subcortical white matter bilaterally most consistent with chronic microvascular ischemia,  especially given the history of sickle cell crisis. Dilated perivascular spaces right parietal white matter. Mild chronic ischemia in the pons. Ventricle size normal.  Cerebral volume normal Negative for hemorrhage.  No mass or edema. Empty sella with a small pituitary and mildly enlarged sella. Paranasal sinuses are clear.  Normal orbit. MRI CERVICAL SPINE FINDINGS Normal cervical alignment. Negative for fracture or mass lesion. Spinal cord signal normal. No cord compression or cord lesion. Bone marrow is diffusely low signal on T2 imaging suggesting cellular marrow. C2-3:  Small central disc protrusion without stenosis C3-4: Small central disc protrusion. Mild uncinate spurring bilaterally with mild foraminal narrowing bilaterally. C4-5:  Small central disc protrusion without spinal stenosis C5-6: Small central disc protrusion with cord flattening and mild spinal stenosis. C6-7: Mild facet degeneration bilaterally. Mild foraminal narrowing bilaterally. Small left-sided disc protrusion. Mild spinal stenosis left greater than right. C7-T1: Small left paracentral disc protrusion. IMPRESSION: Negative for acute cerebral infarction. Mild chronic microvascular ischemic change consistent with sickle cell. Multilevel cervical degenerative change with multiple small disc protrusions. Mild spinal stenosis at C5-6 and C6-7. Diffusely abnormal bone marrow in the cervical spine consistent with chronic anemia. Electronically Signed   By: Marlan Palauharles  Clark M.D.   On: 02/09/2015 10:23    Scheduled Meds: . enoxaparin (LOVENOX) injection  40 mg Subcutaneous QHS  . folic acid  1 mg Oral Daily  . HYDROmorphone   Intravenous 6 times per day  . ketorolac  30 mg Intravenous 4 times per day  . morphine  60 mg Oral BID  . morphine  60 mg Oral 6 times per day  . senna-docusate  1 tablet Oral BID   Time spent 30 minutes

## 2015-02-13 LAB — CULTURE, BLOOD (ROUTINE X 2)
CULTURE: NO GROWTH
CULTURE: NO GROWTH

## 2015-02-13 NOTE — Progress Notes (Signed)
Ambulated one lap around department - O2 sat 95-97% - no SOB or pain with ambulation.

## 2015-02-13 NOTE — Progress Notes (Signed)
Patient d/c instructions given, patient verbalized understanding,teach back utilized. In stable condition. D/c home.

## 2015-02-13 NOTE — Discharge Summary (Signed)
[name] MRN: [id number] DOB/AGE: Nov 08, 1975 39 y.o.  Admit date: 02/08/2015 Discharge date: 02/13/2015  Primary Care Physician:  Dorrene German, MD   Discharge Diagnoses:   Patient Active Problem List   Diagnosis Date Noted  . Chest pain 02/08/2015  . CAP (community acquired pneumonia) 02/08/2015  . Sickle cell crisis acute chest syndrome (HCC) 02/08/2015  . HCAP (healthcare-associated pneumonia) 02/08/2015  . Shoulder pain, right 02/08/2015  . Numbness and tingling in right hand   . Hemoglobin [place] with crisis (HCC) 10/13/2014  . Sickle cell anemia with pain (HCC) 05/31/2014  . Sickle cell crisis (HCC) 10/24/2013  . Sickle cell disease, type [place] (HCC) 10/18/2012  . Sickle cell pain crisis (HCC) 10/13/2012  . History of tobacco abuse 10/13/2012  . Hypokalemia 05/02/2012  . Leukocytosis 05/02/2012  . Anemia 11/08/2011  . Avascular necrosis of hip, left (HCC) 08/27/2011  . Elevated brain natriuretic peptide (BNP) level 08/25/2011  . Acute chest syndrome (HCC) 05/15/2011  . Tobacco abuse 05/15/2011  . Constipation 05/14/2011  . Sickle cell anemia with crisis (HCC) 05/13/2011    DISCHARGE MEDICATION:   Medication List    TAKE these medications        folic acid 1 MG tablet  Commonly known as:  FOLVITE  Take 1 mg by mouth daily.     morphine 30 MG tablet  Commonly known as:  MSIR  Take 30 mg by mouth every 8 (eight) hours as needed for severe pain (pain).     morphine 60 MG 12 hr tablet  Commonly known as:  MS CONTIN  Take 1 tablet (60 mg total) by mouth 2 (two) times daily.     promethazine 25 MG tablet  Commonly known as:  PHENERGAN  Take 25 mg by mouth every 6 (six) hours as needed for nausea (nausea).          Consults:     SIGNIFICANT DIAGNOSTIC STUDIES:  Dg Chest 2 View  02/08/2015  CLINICAL DATA:  39 year old male with history of right-sided chest pain and rib cage pain and some right shoulder pain radiating down the right arm into the right  hand with associated numbness and tingling for the past 2 days. History of sickle cell disease. EXAM: CHEST  2 VIEW COMPARISON:  Chest x-ray 01/29/2015.  Chest CT 01/30/2015. FINDINGS: New 1.6 cm nodular density in the right mid lung appreciated only on the frontal projection. No acute consolidative airspace disease. No pleural effusions. No evidence of pulmonary edema. Heart size is normal. Upper mediastinal contours are within normal limits. Sclerosis in the visualized portions of the left humeral head presumably indicative of avascular necrosis. IMPRESSION: 1. New small nodular density in the right mid lung, presumably infectious or inflammatory given the rapid development compared to the prior study. Chest x-ray is otherwise unremarkable. Electronically Signed   By: Trudie Reed M.D.   On: 02/08/2015 19:40   Dg Chest 2 View  01/29/2015  CLINICAL DATA:  Per pt, states sickle cell pain for 3-4 days-right side chest radiating to back EXAM: CHEST  2 VIEW COMPARISON:  10/12/2014 FINDINGS: Lungs are hyperinflated. Heart size is normal. Scarring identified in the right lung is stable. There are no focal consolidations or pleural effusions. IMPRESSION: Stable appearance of the chest . Electronically Signed   By: Norva Pavlov M.D.   On: 01/29/2015 17:19   Dg Shoulder Right  02/08/2015  CLINICAL DATA:  Right shoulder pain radiating down the right arm EXAM: RIGHT SHOULDER - 2+  VIEW COMPARISON:  10/24/2013 FINDINGS: There is no evidence of fracture or dislocation. There is no evidence of arthropathy or other focal bone abnormality. Patchy osteosclerosis consistent with patient's history of sickle cell anemia. Soft tissues are unremarkable. IMPRESSION: No acute osseous injury of the right shoulder. Electronically Signed   By: Elige Ko   On: 02/08/2015 21:36   Ct Head Wo Contrast  02/08/2015  CLINICAL DATA:  Ribcage pain. Right shoulder pain radiating down the right arm. Right hand numbness and tingling  for 2 days. No injury. EXAM: CT HEAD WITHOUT CONTRAST TECHNIQUE: Contiguous axial images were obtained from the base of the skull through the vertex without intravenous contrast. COMPARISON:  None. FINDINGS: Ventricles and sulci appear symmetrical. No ventricular dilatation. No mass effect or midline shift. No abnormal extra-axial fluid collections. Gray-white matter junctions are distinct. Basal cisterns are not effaced. No evidence of acute intracranial hemorrhage. No depressed skull fractures. Visualized paranasal sinuses and mastoid air cells are not opacified. IMPRESSION: No acute intracranial abnormalities. Electronically Signed   By: Burman Nieves M.D.   On: 02/08/2015 22:50   Ct Angio Chest Pe W/cm &/or Wo Cm  01/30/2015  CLINICAL DATA:  Chest pain. EXAM: CT ANGIOGRAPHY CHEST WITH CONTRAST TECHNIQUE: Multidetector CT imaging of the chest was performed using the standard protocol during bolus administration of intravenous contrast. Multiplanar CT image reconstructions and MIPs were obtained to evaluate the vascular anatomy. CONTRAST:  OMNIPAQUE IOHEXOL 350 MG/ML SOLN COMPARISON:  CT scan of April 28, 2010. FINDINGS: Sclerotic densities are noted throughout the spine consistent with a history of sickle cell disease. No pneumothorax or pleural effusion is noted. Stable subpleural blebs are noted in the right lower lobe. Stable scarring is noted in both lung bases. No acute pulmonary disease is noted. There is no evidence of thoracic aortic dissection or aneurysm. There is no evidence of pulmonary embolus. No significant mediastinal mass or adenopathy is noted. Visualized portion of upper abdomen is unremarkable. Review of the MIP images confirms the above findings. IMPRESSION: No evidence of pulmonary embolus. Stable subpleural blebs are noted in right lung base with scarring seen in both lung bases. No acute abnormality is noted in the chest. Electronically Signed   By: Lupita Raider, M.D.   On:  01/30/2015 08:55   Mr Brain Wo Contrast  02/09/2015  CLINICAL DATA:  Right hand weakness. Right shoulder and arm pain and numbness and tingling. Sickle cell crisis. EXAM: MRI HEAD WITHOUT CONTRAST MRI CERVICAL SPINE WITHOUT CONTRAST TECHNIQUE: Multiplanar, multiecho pulse sequences of the brain and surrounding structures, and cervical spine, to include the craniocervical junction and cervicothoracic junction, were obtained without intravenous contrast. COMPARISON:  CT head 02/08/2015 FINDINGS: MRI HEAD FINDINGS Negative for acute infarct. Small hyperintensities in the subcortical white matter bilaterally most consistent with chronic microvascular ischemia, especially given the history of sickle cell crisis. Dilated perivascular spaces right parietal white matter. Mild chronic ischemia in the pons. Ventricle size normal.  Cerebral volume normal Negative for hemorrhage.  No mass or edema. Empty sella with a small pituitary and mildly enlarged sella. Paranasal sinuses are clear.  Normal orbit. MRI CERVICAL SPINE FINDINGS Normal cervical alignment. Negative for fracture or mass lesion. Spinal cord signal normal. No cord compression or cord lesion. Bone marrow is diffusely low signal on T2 imaging suggesting cellular marrow. C2-3:  Small central disc protrusion without stenosis C3-4: Small central disc protrusion. Mild uncinate spurring bilaterally with mild foraminal narrowing bilaterally. C4-5:  Small central disc  protrusion without spinal stenosis C5-6: Small central disc protrusion with cord flattening and mild spinal stenosis. C6-7: Mild facet degeneration bilaterally. Mild foraminal narrowing bilaterally. Small left-sided disc protrusion. Mild spinal stenosis left greater than right. C7-T1: Small left paracentral disc protrusion. IMPRESSION: Negative for acute cerebral infarction. Mild chronic microvascular ischemic change consistent with sickle cell. Multilevel cervical degenerative change with multiple small  disc protrusions. Mild spinal stenosis at C5-6 and C6-7. Diffusely abnormal bone marrow in the cervical spine consistent with chronic anemia. Electronically Signed   By: Marlan Palauharles  Clark M.D.   On: 02/09/2015 10:23   Mr Cervical Spine Wo Contrast  02/09/2015  CLINICAL DATA:  Right hand weakness. Right shoulder and arm pain and numbness and tingling. Sickle cell crisis. EXAM: MRI HEAD WITHOUT CONTRAST MRI CERVICAL SPINE WITHOUT CONTRAST TECHNIQUE: Multiplanar, multiecho pulse sequences of the brain and surrounding structures, and cervical spine, to include the craniocervical junction and cervicothoracic junction, were obtained without intravenous contrast. COMPARISON:  CT head 02/08/2015 FINDINGS: MRI HEAD FINDINGS Negative for acute infarct. Small hyperintensities in the subcortical white matter bilaterally most consistent with chronic microvascular ischemia, especially given the history of sickle cell crisis. Dilated perivascular spaces right parietal white matter. Mild chronic ischemia in the pons. Ventricle size normal.  Cerebral volume normal Negative for hemorrhage.  No mass or edema. Empty sella with a small pituitary and mildly enlarged sella. Paranasal sinuses are clear.  Normal orbit. MRI CERVICAL SPINE FINDINGS Normal cervical alignment. Negative for fracture or mass lesion. Spinal cord signal normal. No cord compression or cord lesion. Bone marrow is diffusely low signal on T2 imaging suggesting cellular marrow. C2-3:  Small central disc protrusion without stenosis C3-4: Small central disc protrusion. Mild uncinate spurring bilaterally with mild foraminal narrowing bilaterally. C4-5:  Small central disc protrusion without spinal stenosis C5-6: Small central disc protrusion with cord flattening and mild spinal stenosis. C6-7: Mild facet degeneration bilaterally. Mild foraminal narrowing bilaterally. Small left-sided disc protrusion. Mild spinal stenosis left greater than right. C7-T1: Small left  paracentral disc protrusion. IMPRESSION: Negative for acute cerebral infarction. Mild chronic microvascular ischemic change consistent with sickle cell. Multilevel cervical degenerative change with multiple small disc protrusions. Mild spinal stenosis at C5-6 and C6-7. Diffusely abnormal bone marrow in the cervical spine consistent with chronic anemia. Electronically Signed   By: Marlan Palauharles  Clark M.D.   On: 02/09/2015 10:23      Recent Results (from the past 240 hour(s))  Culture, blood (routine x 2)     Status: None (Preliminary result)   Collection Time: 02/08/15  8:33 PM  Result Value Ref Range Status   Specimen Description BLOOD LEFT HAND  Final   Special Requests BOTTLES DRAWN AEROBIC AND ANAEROBIC 5ML  Final   Culture   Final    NO GROWTH 4 DAYS [facility]    Report Status PENDING  Incomplete  Culture, blood (routine x 2)     Status: None (Preliminary result)   Collection Time: 02/08/15  8:33 PM  Result Value Ref Range Status   Specimen Description BLOOD RIGHT HAND  Final   Special Requests BOTTLES DRAWN AEROBIC AND ANAEROBIC 5ML  Final   Culture   Final    NO GROWTH 4 DAYS [facility]formed at [facility]    Report Status PENDING  Incomplete    BRIEF ADMITTING H & P: Pt with Hb [place] was recently discharged from the Hospital and about a week later started experiencing another pain crisis. He tried unsuccessfully  to treat his pain with oral analgesics and was unsuccessful. He presented to the ED for further treatment. He also c/o tingling of right arm and had a negative evaluation for CVA and cervical spinal cause of tingling. He had no fevers/chills but had a cough with production of scant sputum. A CXR showed a small  showed a small nodular density that was interpreted as possible Acute Chest by the admitting Physician although he had no hypoxemia.   Hospital Course:  Present on Admission:  . Sickle cell pain crisis (HCC): Pt was managed with Dialudid via  PCA, Toradol and IVF. He was transitioned to oral analgesics and weaned off PCA. He is discharged home on the pre-hospital regimen of analgesics and is encouraged to follow up with his PMD Dr. Concepcion Elk as needed. . Suspect Acute chest syndrome [facility]): On admission the patient was thought to have an acute chest syndrome. However he had no hypoxemia and no other clinical signs of acute chest or pneumonia. Antibiotics were discontinued and patient had clinical improvement without intervention. At the time of discharge his saturations were 95-97% on RA with ambulation and the patient had no increased work of breathing.  . Chest wall pain: This was as a result of the sickle cell crisis. His pain was resolved by time of discharge. . Constipation: Pt was managed with Senna-S and lactulose with good results on the day of discharge. . Shoulder pain, right: Likely related to crisis. Pain resolved by end of hospital course.  Disposition and Follow-up:  Pt discharged home in good condition and is to follow up with Dr. Concepcion Elk as needed.     Discharge Instructions    Activity as tolerated - No restrictions    Complete by:  As directed      Diet general    Complete by:  As directed            DISCHARGE EXAM:  General: Alert, awake, oriented x3, in mild distress.  Vital Signs: BP 122/78, HR 75, T 98.1 F (36.7 C), temperature source Oral, RR 13, height 6\' 2"  (1.88 m), weight 171 lb 3.2 oz (77.656 kg), SpO2 99 %. HEENT: [place]/AT PEERL, EOMI, anicteric Neck: Trachea midline, no masses, no thyromegal,y no JVD, no carotid bruit OROPHARYNX: Moist, No exudate/ erythema/lesions.  Heart: Regular rate and rhythm, without murmurs, rubs, gallops or S3. PMI non-displaced. Exam reveals no decreased pulses. Pulmonary/Chest: Normal effort. Breath sounds normal. No. Apnea. Clear to auscultation,no stridor,  no wheezing and no rhonchi noted. No respiratory distress and no tenderness noted. Abdomen: Soft, nontender,  nondistended, normal bowel sounds, no masses no hepatosplenomegaly noted. No fluid wave and no ascites. There is no guarding or rebound. Neuro: Alert and oriented to person, place and time. Normal motor skills, Displays no atrophy or tremors and exhibits normal muscle tone.  No focal neurological deficits noted cranial nerves II through XII grossly intact. No sensory deficit noted. Strength at baseline in bilateral upper and lower extremities. Gait normal. Musculoskeletal: No warm swelling or erythema around joints, no spinal tenderness noted. Psychiatric: Patient alert and oriented x3, good insight and cognition, good recent to remote recall. Mood, memory, affect and judgement normal Skin: Skin is warm and dry. No bruising, no ecchymosis and no rash noted. Pt is not diaphoretic. No erythema. No pallor    Recent Labs  02/12/15 0420  NA 141  K 4.0  CL 103  CO2 30  GLUCOSE 96  BUN 15  CREATININE 0.66  CALCIUM 9.2  No results for input(s): AST, ALT, ALKPHOS, BILITOT, PROT, ALBUMIN in the last 72 hours. No results for input(s): LIPASE, AMYLASE in the last 72 hours.  Recent Labs  02/12/15 0420  WBC 10.8*  NEUTROABS 5.4  HGB 9.9*  HCT 27.2*  MCV 92.5  PLT 295     Total time spent including face to face and decision making was greater than 30 minutes  Signed: Cambree Hendrix A. 02/13/2015, 10:20 AM

## 2015-03-18 ENCOUNTER — Emergency Department (HOSPITAL_COMMUNITY): Payer: Medicare Other

## 2015-03-18 ENCOUNTER — Encounter (HOSPITAL_COMMUNITY): Payer: Self-pay

## 2015-03-18 ENCOUNTER — Inpatient Hospital Stay (HOSPITAL_COMMUNITY)
Admission: EM | Admit: 2015-03-18 | Discharge: 2015-03-22 | DRG: 812 | Disposition: A | Discharge status: Home / Self Care | Payer: Medicare Other | Attending: Internal Medicine | Admitting: Internal Medicine

## 2015-03-18 DIAGNOSIS — Z79899 Other long term (current) drug therapy: Secondary | ICD-10-CM

## 2015-03-18 DIAGNOSIS — D57219 Sickle-cell/Hb-C disease with crisis, unspecified: Secondary | ICD-10-CM | POA: Diagnosis not present

## 2015-03-18 DIAGNOSIS — Z79891 Long term (current) use of opiate analgesic: Secondary | ICD-10-CM

## 2015-03-18 DIAGNOSIS — Z720 Tobacco use: Secondary | ICD-10-CM | POA: Diagnosis present

## 2015-03-18 DIAGNOSIS — F17210 Nicotine dependence, cigarettes, uncomplicated: Secondary | ICD-10-CM | POA: Diagnosis present

## 2015-03-18 DIAGNOSIS — D5700 Hb-SS disease with crisis, unspecified: Secondary | ICD-10-CM

## 2015-03-18 DIAGNOSIS — Z96641 Presence of right artificial hip joint: Secondary | ICD-10-CM | POA: Diagnosis present

## 2015-03-18 LAB — COMPREHENSIVE METABOLIC PANEL
ALBUMIN: 4.9 g/dL (ref 3.5–5.0)
ALK PHOS: 90 U/L (ref 38–126)
ALT: 12 U/L — AB (ref 17–63)
ANION GAP: 9 (ref 5–15)
AST: 21 U/L (ref 15–41)
BILIRUBIN TOTAL: 2.5 mg/dL — AB (ref 0.3–1.2)
BUN: 8 mg/dL (ref 6–20)
CALCIUM: 9.6 mg/dL (ref 8.9–10.3)
CO2: 25 mmol/L (ref 22–32)
CREATININE: 0.73 mg/dL (ref 0.61–1.24)
Chloride: 103 mmol/L (ref 101–111)
GFR calc Af Amer: >60 mL/min (ref >60)
GFR calc non Af Amer: >60 mL/min (ref >60)
GLUCOSE: 100 mg/dL — AB (ref 65–99)
Potassium: 3.5 mmol/L (ref 3.5–5.1)
Sodium: 137 mmol/L (ref 135–145)
Total Protein: 8.6 g/dL — ABNORMAL HIGH (ref 6.5–8.1)

## 2015-03-18 LAB — CBC WITH DIFFERENTIAL/PLATELET
BASOS PCT: 0 %
Basophils Absolute: 0 10*3/uL (ref 0.0–0.1)
EOS ABS: 0.4 10*3/uL (ref 0.0–0.7)
Eosinophils Relative: 4 %
HCT: 35.5 % — ABNORMAL LOW (ref 39.0–52.0)
HEMOGLOBIN: 13.1 g/dL (ref 13.0–17.0)
LYMPHS ABS: 1.9 10*3/uL (ref 0.7–4.0)
LYMPHS PCT: 19 %
MCH: 34.3 pg — AB (ref 26.0–34.0)
MCHC: 36.9 g/dL — AB (ref 30.0–36.0)
MCV: 92.9 fL (ref 78.0–100.0)
Monocytes Absolute: 0.9 10*3/uL (ref 0.1–1.0)
Monocytes Relative: 9 %
NEUTROS ABS: 7 10*3/uL (ref 1.7–7.7)
Neutrophils Relative %: 68 %
Platelets: 308 10*3/uL (ref 150–400)
RBC: 3.82 MIL/uL — ABNORMAL LOW (ref 4.22–5.81)
RDW: 14.3 % (ref 11.5–15.5)
WBC: 10.2 10*3/uL (ref 4.0–10.5)

## 2015-03-18 LAB — I-STAT TROPONIN, ED: TROPONIN I, POC: 0.01 ng/mL (ref 0.00–0.08)

## 2015-03-18 MED ORDER — SODIUM CHLORIDE 0.9 % IV BOLUS (SEPSIS)
500.0000 mL | Freq: Once | INTRAVENOUS | Status: AC
  Administered 2015-03-18: 500 mL via INTRAVENOUS

## 2015-03-18 MED ORDER — DIPHENHYDRAMINE HCL 50 MG/ML IJ SOLN
25.0000 mg | Freq: Once | INTRAMUSCULAR | Status: AC
  Administered 2015-03-18: 25 mg via INTRAVENOUS
  Filled 2015-03-18: qty 1

## 2015-03-18 MED ORDER — HYDROMORPHONE HCL 2 MG/ML IJ SOLN
2.0000 mg | Freq: Once | INTRAMUSCULAR | Status: AC
  Administered 2015-03-18: 2 mg via INTRAVENOUS
  Filled 2015-03-18: qty 1

## 2015-03-18 MED ORDER — KETOROLAC TROMETHAMINE 30 MG/ML IJ SOLN
30.0000 mg | Freq: Once | INTRAMUSCULAR | Status: AC
  Administered 2015-03-18: 30 mg via INTRAVENOUS
  Filled 2015-03-18: qty 1

## 2015-03-18 MED ORDER — ONDANSETRON HCL 4 MG/2ML IJ SOLN
4.0000 mg | Freq: Once | INTRAMUSCULAR | Status: AC
  Administered 2015-03-18: 4 mg via INTRAVENOUS
  Filled 2015-03-18: qty 2

## 2015-03-18 NOTE — ED Notes (Signed)
Pt c/o bilateral ribcage pain and R shoulder pain r/ Sickle Cell crisis x 2-3 days.  Pain score 10/10.  Pt reports taking all medications as prescribed w/o relief.

## 2015-03-18 NOTE — ED Provider Notes (Signed)
CSN: [id number]     Arrival date & time 03/18/15  1846 History   First MD Initiated Contact with Patient 03/18/15 2151     Chief Complaint  Patient presents with  . Sickle Cell Pain Crisis  . Ribcage pain   . Shoulder Pain     (Consider location/radiation/quality/duration/timing/severity/associated sxs/prior Treatment) Patient is a 39 y.o. male presenting with sickle cell pain and shoulder pain. The history is provided by the patient.  Sickle Cell Pain Crisis Associated symptoms: chest pain   Associated symptoms: no shortness of breath and no vomiting   Shoulder Pain Associated symptoms: no back pain    patient presents with sickle cell pain crisis. Began 2-3 days ago. Is in his right shoulder right chest. Typical spots of his had in the past. He is hemoglobin [place]. Pain uncontrolled with his morphine at home. He is on 60 mg of morphine extended-release and has 30 mg for as needed pain.  Past Medical History  Diagnosis Date  . Sickle cell crisis (HCC)   . Blood transfusion   . Avascular necrosis of hip (HCC)     bilateral  . Infection of bone, shoulder region (HCC)     left shoulder  . Pneumonia   . Avascular necrosis of hip, left (HCC) 08/27/2011   Past Surgical History  Procedure Laterality Date  . Orif right hip fracture  1995  . Joint replacement  2006    right total hip arthroplasty  . Bone graft hip iliac crest     Family History  Problem Relation Age of Onset  . Adopted: Yes   Social History  Substance Use Topics  . Smoking status: Current Some Day Smoker -- 0.50 packs/day    Types: Cigarettes    Last Attempt to Quit: 01/27/2012  . Smokeless tobacco: Never Used  . Alcohol Use: No    Review of Systems  Constitutional: Negative for activity change and appetite change.  Eyes: Negative for pain.  Respiratory: Negative for chest tightness and shortness of breath.   Cardiovascular: Positive for chest pain.  Gastrointestinal: Negative for vomiting, abdominal pain  and diarrhea.  Genitourinary: Negative for flank pain.  Musculoskeletal: Negative for back pain and neck stiffness.  Skin: Negative for rash.  Neurological: Negative for weakness and numbness.  Psychiatric/Behavioral: Negative for behavioral problems.      Allergies  Review of patient's allergies indicates no known allergies.  Home Medications   Prior to Admission medications   Medication Sig Start Date End Date Taking? Authorizing Provider  folic acid (FOLVITE) 1 MG tablet Take 1 mg by mouth daily.   Yes Historical Provider, MD  morphine (MS CONTIN) 60 MG 12 hr tablet Take 1 tablet (60 mg total) by mouth 2 (two) times daily. Patient taking differently: Take 60 mg by mouth 2 (two) times daily as needed for pain.  11/20/11  Yes Grayce Sessions, NP  morphine (MSIR) 30 MG tablet Take 30 mg by mouth every 8 (eight) hours as needed for severe pain (pain).    Yes Historical Provider, MD  promethazine (PHENERGAN) 25 MG tablet Take 25 mg by mouth every 6 (six) hours as needed for nausea (nausea).    Yes Historical Provider, MD   BP 112/84 mmHg  Pulse 63  Temp(Src) 98.5 F (36.9 C) (Oral)  Resp 20  Wt 170 lb (77.111 kg)  SpO2 95% Physical Exam  Constitutional: He is oriented to person, place, and time. He appears well-developed and well-nourished.  HENT:  Head: Normocephalic  and atraumatic.  Eyes: EOM are normal. Pupils are equal, round, and reactive to light.  Neck: Normal range of motion. Neck supple.  Cardiovascular: Normal rate, regular rhythm and normal heart sounds.   No murmur heard. Pulmonary/Chest: Effort normal and breath sounds normal.  Abdominal: Soft. Bowel sounds are normal. He exhibits no distension. There is no tenderness.  Musculoskeletal: Normal range of motion. He exhibits no edema.  Tenderness over right shoulder.  Neurological: He is alert and oriented to person, place, and time. No cranial nerve deficit.  Skin: Skin is warm and dry.  Nursing note and vitals  reviewed.   ED Course  Procedures (including critical care time) Labs Review Labs Reviewed  COMPREHENSIVE METABOLIC PANEL - Abnormal; Notable for the following:    Glucose, Bld 100 (*)    Total Protein 8.6 (*)    ALT 12 (*)    Total Bilirubin 2.5 (*)    All other components within normal limits  CBC WITH DIFFERENTIAL/PLATELET - Abnormal; Notable for the following:    RBC 3.82 (*)    HCT 35.5 (*)    MCH 34.3 (*)    MCHC 36.9 (*)    All other components within normal limits  I-STAT TROPOININ, ED    Imaging Review Dg Chest 2 View  03/18/2015  CLINICAL DATA:  Right side chest pain and shortness of breath for 2-3 days. Initial encounter. EXAM: CHEST  2 VIEW COMPARISON:  PA and lateral chest 02/08/2015.  CT chest 01/30/2015. FINDINGS: Scattered bulla are seen with pulmonary scar. No consolidative process, pneumothorax or effusion is identified. Heart size is normal. No focal bony abnormality. IMPRESSION: No acute abnormality. Bilateral pulmonary scarring scattered bullous change. Electronically Signed   By: Drusilla Kannerhomas  Dalessio M.D.   On: 03/18/2015 20:25   Dg Shoulder Right  03/18/2015  CLINICAL DATA:  Initial valuation for acute right shoulder pain. EXAM: RIGHT SHOULDER - 2+ VIEW COMPARISON:  Prior radiograph from 02/08/2015. FINDINGS: No acute fracture or dislocation. Humeral head in normal alignment with the glenoid. AC joint approximated. No periarticular calcification. Osseous mineralization normal. IMPRESSION: No acute osseous abnormality about the shoulder. Electronically Signed   By: Rise MuBenjamin  McClintock M.D.   On: 03/18/2015 22:45   I have personally reviewed and evaluated these images and lab results as part of my medical decision-making.   EKG Interpretation   Date/Time:  Monday March 18 2015 19:10:32 EST Ventricular Rate:  92 PR Interval:  127 QRS Duration: 83 QT Interval:  353 QTC Calculation: 437 R Axis:   44 Text Interpretation:  Sinus rhythm Nonspecific T  abnormalities, inferior  leads Baseline wander in lead(s) V5 V6 Confirmed by Rubin PayorPICKERING  MD, Harrold DonathNATHAN  [phone]) on 03/18/2015 10:27:13 PM      MDM   Final diagnoses:  Sickle cell pain crisis (HCC)    Patient with sickle cell pain. History of same. Hemoglobin is actually up. Pain is unrelieved with IV Dilaudid. Will admit to internal medicine.    Benjiman CoreNathan Evelette Hollern, MD 03/19/15 [phone]

## 2015-03-19 ENCOUNTER — Encounter (HOSPITAL_COMMUNITY): Payer: Self-pay | Admitting: Internal Medicine

## 2015-03-19 DIAGNOSIS — Z720 Tobacco use: Secondary | ICD-10-CM | POA: Diagnosis not present

## 2015-03-19 DIAGNOSIS — D57219 Sickle-cell/Hb-C disease with crisis, unspecified: Secondary | ICD-10-CM | POA: Diagnosis present

## 2015-03-19 DIAGNOSIS — D5700 Hb-SS disease with crisis, unspecified: Secondary | ICD-10-CM

## 2015-03-19 DIAGNOSIS — Z96641 Presence of right artificial hip joint: Secondary | ICD-10-CM | POA: Diagnosis present

## 2015-03-19 DIAGNOSIS — Z79891 Long term (current) use of opiate analgesic: Secondary | ICD-10-CM | POA: Diagnosis not present

## 2015-03-19 DIAGNOSIS — Z79899 Other long term (current) drug therapy: Secondary | ICD-10-CM | POA: Diagnosis not present

## 2015-03-19 DIAGNOSIS — F17210 Nicotine dependence, cigarettes, uncomplicated: Secondary | ICD-10-CM | POA: Diagnosis present

## 2015-03-19 LAB — CBC WITH DIFFERENTIAL/PLATELET
BASOS ABS: 0.1 10*3/uL (ref 0.0–0.1)
Basophils Relative: 1 %
EOS ABS: 0.5 10*3/uL (ref 0.0–0.7)
Eosinophils Relative: 6 %
HCT: 31 % — ABNORMAL LOW (ref 39.0–52.0)
Hemoglobin: 11.2 g/dL — ABNORMAL LOW (ref 13.0–17.0)
LYMPHS ABS: 3.5 10*3/uL (ref 0.7–4.0)
Lymphocytes Relative: 40 %
MCH: 33.4 pg (ref 26.0–34.0)
MCHC: 36.1 g/dL — AB (ref 30.0–36.0)
MCV: 92.5 fL (ref 78.0–100.0)
MONO ABS: 1 10*3/uL (ref 0.1–1.0)
MONOS PCT: 11 %
NEUTROS ABS: 3.7 10*3/uL (ref 1.7–7.7)
Neutrophils Relative %: 42 %
PLATELETS: 266 10*3/uL (ref 150–400)
RBC: 3.35 MIL/uL — AB (ref 4.22–5.81)
RDW: 14.5 % (ref 11.5–15.5)
WBC: 8.8 10*3/uL (ref 4.0–10.5)

## 2015-03-19 LAB — COMPREHENSIVE METABOLIC PANEL
ALK PHOS: 77 U/L (ref 38–126)
ALT: 11 U/L — ABNORMAL LOW (ref 17–63)
ANION GAP: 8 (ref 5–15)
AST: 17 U/L (ref 15–41)
Albumin: 4.2 g/dL (ref 3.5–5.0)
BUN: 10 mg/dL (ref 6–20)
CALCIUM: 9.1 mg/dL (ref 8.9–10.3)
CO2: 26 mmol/L (ref 22–32)
Chloride: 107 mmol/L (ref 101–111)
Creatinine, Ser: 0.78 mg/dL (ref 0.61–1.24)
Glucose, Bld: 94 mg/dL (ref 65–99)
Potassium: 3.9 mmol/L (ref 3.5–5.1)
SODIUM: 141 mmol/L (ref 135–145)
TOTAL PROTEIN: 7.5 g/dL (ref 6.5–8.1)
Total Bilirubin: 2.3 mg/dL — ABNORMAL HIGH (ref 0.3–1.2)

## 2015-03-19 LAB — MAGNESIUM: MAGNESIUM: 2.1 mg/dL (ref 1.7–2.4)

## 2015-03-19 LAB — RETICULOCYTES
RBC.: 3.35 MIL/uL — AB (ref 4.22–5.81)
RETIC COUNT ABSOLUTE: 57 10*3/uL (ref 19.0–186.0)
RETIC CT PCT: 1.7 % (ref 0.4–3.1)

## 2015-03-19 LAB — PHOSPHORUS: PHOSPHORUS: 5 mg/dL — AB (ref 2.5–4.6)

## 2015-03-19 MED ORDER — HYDROMORPHONE 1 MG/ML IV SOLN
INTRAVENOUS | Status: DC
  Administered 2015-03-19: 1.4 mg via INTRAVENOUS
  Administered 2015-03-19: 3.3 mg via INTRAVENOUS
  Administered 2015-03-19: 1 mg via INTRAVENOUS
  Filled 2015-03-19: qty 25

## 2015-03-19 MED ORDER — SODIUM CHLORIDE 0.9 % IJ SOLN: 9.0000 mL | INTRAMUSCULAR | Status: DC | PRN

## 2015-03-19 MED ORDER — ONDANSETRON HCL 4 MG PO TABS: 4.0000 mg | ORAL_TABLET | ORAL | Status: DC | PRN

## 2015-03-19 MED ORDER — HYDROMORPHONE HCL 2 MG/ML IJ SOLN
2.0000 mg | Freq: Once | INTRAMUSCULAR | Status: AC
  Administered 2015-03-19: 2 mg via INTRAVENOUS
  Filled 2015-03-19: qty 1

## 2015-03-19 MED ORDER — IBUPROFEN 800 MG PO TABS: 800.0000 mg | ORAL_TABLET | Freq: Three times a day (TID) | ORAL | Status: DC | PRN

## 2015-03-19 MED ORDER — PROMETHAZINE HCL 25 MG PO TABS
25.0000 mg | ORAL_TABLET | Freq: Four times a day (QID) | ORAL | Status: DC | PRN
  Administered 2015-03-21: 25 mg via ORAL
  Filled 2015-03-19: qty 1

## 2015-03-19 MED ORDER — MORPHINE SULFATE ER 30 MG PO TBCR
60.0000 mg | EXTENDED_RELEASE_TABLET | Freq: Two times a day (BID) | ORAL | Status: DC
  Administered 2015-03-19 – 2015-03-22 (×8): 60 mg via ORAL
  Filled 2015-03-19 (×8): qty 2

## 2015-03-19 MED ORDER — FOLIC ACID 1 MG PO TABS
1.0000 mg | ORAL_TABLET | Freq: Every day | ORAL | Status: DC
Start: 2015-03-19 — End: 2015-03-22
  Administered 2015-03-19 – 2015-03-22 (×4): 1 mg via ORAL
  Filled 2015-03-19 (×4): qty 1

## 2015-03-19 MED ORDER — HYDROMORPHONE 1 MG/ML IV SOLN
INTRAVENOUS | Status: DC
  Administered 2015-03-19: 2 mg via INTRAVENOUS
  Administered 2015-03-19: 1.2 mg via INTRAVENOUS
  Administered 2015-03-19: 3 mg via INTRAVENOUS
  Administered 2015-03-20: 1.2 mg via INTRAVENOUS
  Administered 2015-03-20: 1.8 mg via INTRAVENOUS
  Administered 2015-03-20: via INTRAVENOUS
  Administered 2015-03-20: 4.2 mg via INTRAVENOUS
  Administered 2015-03-20: 6 mg via INTRAVENOUS
  Administered 2015-03-20: 9 mg via INTRAVENOUS
  Administered 2015-03-20: 7.2 mg via INTRAVENOUS
  Administered 2015-03-20: 3.6 mg via INTRAVENOUS
  Administered 2015-03-20: 14:00:00 via INTRAVENOUS
  Administered 2015-03-21: 7.19 mg via INTRAVENOUS
  Administered 2015-03-21: 7.2 mg via INTRAVENOUS
  Administered 2015-03-21: 4.8 mg via INTRAVENOUS
  Administered 2015-03-21: 6.6 mg via INTRAVENOUS
  Administered 2015-03-21: 14:00:00 via INTRAVENOUS
  Administered 2015-03-21: 7.2 mg via INTRAVENOUS
  Administered 2015-03-22: 08:00:00 via INTRAVENOUS
  Administered 2015-03-22: 0.6 mg via INTRAVENOUS
  Administered 2015-03-22: 13.2 mg via INTRAVENOUS
  Administered 2015-03-22: 3 mg via INTRAVENOUS
  Filled 2015-03-19 (×5): qty 25

## 2015-03-19 MED ORDER — NALOXONE HCL 0.4 MG/ML IJ SOLN
0.4000 mg | INTRAMUSCULAR | Status: DC | PRN
Start: 2015-03-19 — End: 2015-03-22

## 2015-03-19 MED ORDER — ENOXAPARIN SODIUM 40 MG/0.4ML ~~LOC~~ SOLN
40.0000 mg | SUBCUTANEOUS | Status: DC
  Filled 2015-03-19 (×4): qty 0.4

## 2015-03-19 MED ORDER — HYDROXYZINE HCL 25 MG PO TABS: 25.0000 mg | ORAL_TABLET | ORAL | Status: DC | PRN

## 2015-03-19 MED ORDER — SENNOSIDES-DOCUSATE SODIUM 8.6-50 MG PO TABS
1.0000 | ORAL_TABLET | Freq: Two times a day (BID) | ORAL | Status: DC
  Administered 2015-03-19 – 2015-03-22 (×8): 1 via ORAL
  Filled 2015-03-19 (×8): qty 1

## 2015-03-19 MED ORDER — NALOXONE HCL 0.4 MG/ML IJ SOLN
0.4000 mg | INTRAMUSCULAR | Status: DC | PRN
Start: 2015-03-19 — End: 2015-03-19

## 2015-03-19 MED ORDER — ONDANSETRON HCL 4 MG/2ML IJ SOLN
4.0000 mg | INTRAMUSCULAR | Status: DC | PRN
  Administered 2015-03-20 – 2015-03-22 (×8): 4 mg via INTRAVENOUS
  Filled 2015-03-19 (×8): qty 2

## 2015-03-19 MED ORDER — DIPHENHYDRAMINE HCL 12.5 MG/5ML PO ELIX: 12.5000 mg | ORAL_SOLUTION | Freq: Four times a day (QID) | ORAL | Status: DC | PRN

## 2015-03-19 MED ORDER — NICOTINE 21 MG/24HR TD PT24
21.0000 mg | MEDICATED_PATCH | Freq: Every day | TRANSDERMAL | Status: DC
  Administered 2015-03-19 – 2015-03-22 (×4): 21 mg via TRANSDERMAL
  Filled 2015-03-19 (×5): qty 1

## 2015-03-19 MED ORDER — DIPHENHYDRAMINE HCL 50 MG/ML IJ SOLN: 12.5000 mg | Freq: Four times a day (QID) | INTRAMUSCULAR | Status: DC | PRN

## 2015-03-19 MED ORDER — DIPHENHYDRAMINE HCL 25 MG PO CAPS: 25.0000 mg | ORAL_CAPSULE | Freq: Four times a day (QID) | ORAL | Status: DC | PRN

## 2015-03-19 MED ORDER — KETOROLAC TROMETHAMINE 30 MG/ML IJ SOLN
30.0000 mg | Freq: Four times a day (QID) | INTRAMUSCULAR | Status: DC
  Administered 2015-03-19 – 2015-03-22 (×13): 30 mg via INTRAVENOUS
  Filled 2015-03-19 (×13): qty 1

## 2015-03-19 MED ORDER — DEXTROSE-NACL 5-0.45 % IV SOLN
INTRAVENOUS | Status: AC
  Administered 2015-03-19 (×2): via INTRAVENOUS

## 2015-03-19 MED ORDER — POLYETHYLENE GLYCOL 3350 17 G PO PACK: 17.0000 g | PACK | Freq: Every day | ORAL | Status: DC | PRN

## 2015-03-19 NOTE — Progress Notes (Signed)
SICKLE CELL SERVICE PROGRESS NOTE  Malik PortHoward E [name] WUJ:[id number] DOB: 1975-08-02 DOA: 03/18/2015 PCP: Dorrene GermanAVBUERE,EDWIN A, MD  Assessment/Plan: Active Problems:   Tobacco abuse   Sickle cell pain crisis (HCC)   Sickle cell crisis (HCC)  1. Hb [place] with crisis: This is an opiate tolerant patient who presents in crisis. He currently rates his pain at 8/10 and reports that he can get only 2 doses on the PCA before he is locked out. Thus the pain then begins to escalate again. I will change PCA to weight based PCA, resume Toradol and continue IVF. Will re-assess this afternoon for further adjustment to therapy overnight.    Code Status: Full Code Family Communication: N/A Disposition Plan: Not yet ready for discharge  [name],[name]  Pager [phone]. If 7PM-7AM, please contact night-coverage.  03/19/2015, 11:00 AM  LOS: 0 days    Consultants:  None  Procedures:  None  Antibiotics:  None   Objective: Filed Vitals:   03/19/15 0348 03/19/15 0430 03/19/15 0806 03/19/15 0900  BP:  90/50  106/51  Pulse:  54  60  Temp:  97.9 F (36.6 C)  97.6 F (36.4 C)  TempSrc:  Oral  Oral  Resp: 11 16 16 13   Weight:      SpO2: 98% 96% 97% 96%   Weight change:   Intake/Output Summary (Last 24 hours) at 03/19/15 1100 Last data filed at 03/19/15 0900  Gross per 24 hour  Intake    240 ml  Output      0 ml  Net    240 ml    General: Alert, awake, oriented x3, in mild distress  Distress secondary to pain. HEENT: [place]/AT PEERL, EOMI, anicteric Neck: Trachea midline,  no masses, no thyromegal,y no JVD, no carotid bruit OROPHARYNX:  Moist, No exudate/ erythema/lesions.  Heart: Regular rate and rhythm, without murmurs, rubs, gallops, PMI non-displaced, no heaves or thrills on palpation.  Lungs: Clear to auscultation, no wheezing or rhonchi noted. No increased vocal fremitus resonant to percussion  Abdomen: Soft, nontender, nondistended, positive bowel sounds, no masses no  hepatosplenomegaly noted..  Neuro: No focal neurological deficits noted cranial nerves II through XII grossly intact.  Strength at baseline in bilateral upper and lower extremities. Musculoskeletal: No warm swelling or erythema around joints, no spinal tenderness noted. Psychiatric: Patient alert and oriented x3, good insight and cognition, good recent to remote recall. Lymph node survey: No cervical axillary or inguinal lymphadenopathy noted.   Data Reviewed: Basic Metabolic Panel:  Recent Labs Lab 03/18/15 1937 03/19/15 0448  NA 137 141  K 3.5 3.9  CL 103 107  CO2 25 26  GLUCOSE 100* 94  BUN 8 10  CREATININE 0.73 0.78  CALCIUM 9.6 9.1  MG  --  2.1  PHOS  --  5.0*   Liver Function Tests:  Recent Labs Lab 03/18/15 1937 03/19/15 0448  AST 21 17  ALT 12* 11*  ALKPHOS 90 77  BILITOT 2.5* 2.3*  PROT 8.6* 7.5  ALBUMIN 4.9 4.2   No results for input(s): LIPASE, AMYLASE in the last 168 hours. No results for input(s): AMMONIA in the last 168 hours. CBC:  Recent Labs Lab 03/18/15 1937 03/19/15 0448  WBC 10.2 8.8  NEUTROABS 7.0 3.7  HGB 13.1 11.2*  HCT 35.5* 31.0*  MCV 92.9 92.5  PLT 308 266   Cardiac Enzymes: No results for input(s): CKTOTAL, CKMB, CKMBINDEX, TROPONINI in the last 168 hours. BNP (last 3 results) No results for input(s): BNP in  the last 8760 hours.  ProBNP (last 3 results) No results for input(s): PROBNP in the last 8760 hours.  CBG: No results for input(s): GLUCAP in the last 168 hours.  No results found for this or any previous visit (from the past 240 hour(s)).   Studies: Dg Chest 2 View  03/18/2015  CLINICAL DATA:  Right side chest pain and shortness of breath for 2-3 days. Initial encounter. EXAM: CHEST  2 VIEW COMPARISON:  PA and lateral chest 02/08/2015.  CT chest 01/30/2015. FINDINGS: Scattered bulla are seen with pulmonary scar. No consolidative process, pneumothorax or effusion is identified. Heart size is normal. No focal bony  abnormality. IMPRESSION: No acute abnormality. Bilateral pulmonary scarring scattered bullous change. Electronically Signed   By: Drusilla Kanner M.D.   On: 03/18/2015 20:25   Dg Shoulder Right  03/18/2015  CLINICAL DATA:  Initial valuation for acute right shoulder pain. EXAM: RIGHT SHOULDER - 2+ VIEW COMPARISON:  Prior radiograph from 02/08/2015. FINDINGS: No acute fracture or dislocation. Humeral head in normal alignment with the glenoid. AC joint approximated. No periarticular calcification. Osseous mineralization normal. IMPRESSION: No acute osseous abnormality about the shoulder. Electronically Signed   By: Rise Mu M.D.   On: 03/18/2015 22:45    Scheduled Meds: . enoxaparin (LOVENOX) injection  40 mg Subcutaneous Q24H  . folic acid  1 mg Oral Daily  . HYDROmorphone   Intravenous 6 times per day  . ketorolac  30 mg Intravenous 4 times per day  . morphine  60 mg Oral BID  . nicotine  21 mg Transdermal Daily  . senna-docusate  1 tablet Oral BID   Continuous Infusions: . dextrose 5 % and 0.45% NaCl 100 mL/hr at 03/19/15 0219    Time spent 25 minutes.

## 2015-03-19 NOTE — H&P (Signed)
PCP: Dorrene GermanAVBUERE,EDWIN A, MD    Referring provider Rubin PayorPickering   Chief Complaint:  Ribs and shoulder pain  HPI: [name] is a 39 y.o. 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   Presented with rib cage pain as well as right shoulder pain similar to prior sickle cell crisis has been going on for past 3 days. Patient attempted to use home medications but with out relief. He is usually on 60 mg of morphine extended release and then in addition to it takes 40 mg for when necessary pain.  Patient endorsed some mild shortness of breath that is typical for sickle cell and related more to pain. Denies fever chills or coughing. Chest x-ray showing bilateral pulmonary scarring but no acute infiltrate. Right shoulder film showing no acute abnormality. Emergency department patient was given Benadryl 25 mg and then Dilaudid 2 mg to 3 ptosis as well as Toradol 30 mg is not relief.  His last hospitalization was on 14th of October and he was hospitalized for 5 days. Urine admission he was managed with IV Dilaudid PCA Hospitalist was called for admission for sickle cell crisis  Review of Systems:    Pertinent positives include: Right shoulder pain  Constitutional:  No weight loss, night sweats, Fevers, chills, fatigue, weight loss  HEENT:  No headaches, Difficulty swallowing,Tooth/dental problems,Sore throat,  No sneezing, itching, ear ache, nasal congestion, post nasal drip,  Cardio-vascular:  No chest pain, Orthopnea, PND, anasarca, dizziness, palpitations.no Bilateral lower extremity swelling  GI:  No heartburn, indigestion, abdominal pain, nausea, vomiting, diarrhea, change in bowel habits, loss of appetite, melena, blood in stool, hematemesis Resp:  no shortness of breath at rest. No dyspnea on exertion, No excess  mucus, no productive cough, No non-productive cough, No coughing up of blood.No change in color of mucus.No wheezing. Skin:  no rash or lesions. No jaundice GU:  no dysuria, change in color of urine, no urgency or frequency. No straining to urinate.  No flank pain.  Musculoskeletal:  No joint pain or no joint swelling. No decreased range of motion. No back pain.  Psych:  No change in mood or affect. No depression or anxiety. No memory loss.  Neuro: no localizing neurological complaints, no tingling, no weakness, no double vision, no gait abnormality, no slurred speech, no confusion  Otherwise ROS are negative except for above, 10 systems were reviewed  Past Medical History: Past Medical History  Diagnosis Date  . Sickle cell crisis (HCC)   . Blood transfusion   . Avascular necrosis of hip (HCC)     bilateral  . Infection of bone, shoulder region (HCC)     left shoulder  . Pneumonia   . Avascular necrosis of hip, left (HCC) 08/27/2011   Past Surgical History  Procedure Laterality Date  . Orif right hip fracture  1995  . Joint replacement  2006    right total hip arthroplasty  . Bone graft hip iliac crest       Medications: Prior to Admission medications   Medication Sig Start Date End Date Taking? Authorizing Provider  folic acid (FOLVITE) 1 MG tablet Take 1 mg by mouth daily.   Yes Historical Provider, MD  morphine (MS CONTIN) 60 MG 12 hr tablet Take 1 tablet (60 mg total) by mouth 2 (two) times daily. Patient taking differently:  Take 60 mg by mouth 2 (two) times daily as needed for pain.  11/20/11  Yes Grayce Sessions, NP  morphine (MSIR) 30 MG tablet Take 30 mg by mouth every 8 (eight) hours as needed for severe pain (pain).    Yes Historical Provider, MD  promethazine (PHENERGAN) 25 MG tablet Take 25 mg by mouth every 6 (six) hours as needed for nausea (nausea).    Yes Historical Provider, MD    Allergies:  No Known Allergies  Social History:  Ambulatory    Independently  Lives at home   With family     reports that he has been smoking Cigarettes.  He has been smoking about 0.50 packs per day. He has never used smokeless tobacco. He reports that he does not drink alcohol or use illicit drugs.    Family History: family history is not on file. He was adopted.    Physical Exam: Patient Vitals for the past 24 hrs:  BP Temp Temp src Pulse Resp SpO2 Weight  03/18/15 2204 - - - - - 99 % -  03/18/15 2140 - - - - - - 77.111 kg (170 lb)  03/18/15 1906 (!) 122/106 mmHg 98.5 F (36.9 C) Oral 94 20 96 % -    1. General:  in No Acute distress 2. Psychological: Alert and   Oriented 3. Head/ENT:     Dry Mucous Membranes                          Head Non traumatic, neck supple                          Normal  Dentition 4. SKIN:  decreased Skin turgor,  Skin clean Dry and intact no rash 5. Heart: Regular rate and rhythm no Murmur, Rub or gallop 6. Lungs: Clear to auscultation bilaterally, no wheezes or crackles   7. Abdomen: Soft, non-tender, Non distended 8. Lower extremities: no clubbing, cyanosis, or edema 9. Neurologically Grossly intact, moving all 4 extremities equally 10. MSK: Normal range of motion  body mass index is 21.82 kg/(m^2).   Labs on Admission:   Results for orders placed or performed during the hospital encounter of 03/18/15 (from the past 24 hour(s))  Comprehensive metabolic panel     Status: Abnormal   Collection Time: 03/18/15  7:37 PM  Result Value Ref Range   Sodium 137 135 - 145 mmol/L   Potassium 3.5 3.5 - 5.1 mmol/L   Chloride 103 101 - 111 mmol/L   CO2 25 22 - 32 mmol/L   Glucose, Bld 100 (H) 65 - 99 mg/dL   BUN 8 6 - 20 mg/dL   Creatinine, Ser 1.61 0.61 - 1.24 mg/dL   Calcium 9.6 8.9 - 09.6 mg/dL   Total Protein 8.6 (H) 6.5 - 8.1 g/dL   Albumin 4.9 3.5 - 5.0 g/dL   AST 21 15 - 41 U/L   ALT 12 (L) 17 - 63 U/L   Alkaline Phosphatase 90 38 - 126 U/L   Total Bilirubin 2.5 (H) 0.3 - 1.2 mg/dL   GFR calc non  Af Amer >60 >60 mL/min   GFR calc Af Amer >60 >60 mL/min   Anion gap 9 5 - 15  CBC with Differential     Status: Abnormal   Collection Time: 03/18/15  7:37 PM  Result Value Ref Range   WBC 10.2 4.0 - 10.5 K/uL  RBC 3.82 (L) 4.22 - 5.81 MIL/uL   Hemoglobin 13.1 13.0 - 17.0 g/dL   HCT 16.1 (L) 09.6 - 04.5 %   MCV 92.9 78.0 - 100.0 fL   MCH 34.3 (H) 26.0 - 34.0 pg   MCHC 36.9 (H) 30.0 - 36.0 g/dL   RDW 40.9 81.1 - 91.4 %   Platelets 308 150 - 400 K/uL   Neutrophils Relative % 68 %   Lymphocytes Relative 19 %   Monocytes Relative 9 %   Eosinophils Relative 4 %   Basophils Relative 0 %   Neutro Abs 7.0 1.7 - 7.7 K/uL   Lymphs Abs 1.9 0.7 - 4.0 K/uL   Monocytes Absolute 0.9 0.1 - 1.0 K/uL   Eosinophils Absolute 0.4 0.0 - 0.7 K/uL   Basophils Absolute 0.0 0.0 - 0.1 K/uL   Smear Review MORPHOLOGY UNREMARKABLE   I-stat troponin, ED (not at [facility], [name])     Status: None   Collection Time: 03/18/15  7:43 PM  Result Value Ref Range   Troponin i, poc 0.01 0.00 - 0.08 ng/mL   Comment 3            UA not obtained  No results found for: HGBA1C  Estimated Creatinine Clearance: 135.2 mL/min (by C-G formula based on Cr of 0.73).  BNP (last 3 results) No results for input(s): PROBNP in the last 8760 hours.  Other results:  I have pearsonaly reviewed this: ECG REPORT  Rate: 92  Rhythm: Normal sinus rate ST&T Change: No skin changes QTC within normal limits  Filed Weights   03/18/15 2140  Weight: 77.111 kg (170 lb)     Cultures:    Component Value Date/Time   SDES BLOOD LEFT HAND 02/08/2015 2033   SDES BLOOD RIGHT HAND 02/08/2015 2033   SPECREQUEST BOTTLES DRAWN AEROBIC AND ANAEROBIC 02/08/2015 2033   SPECREQUEST BOTTLES DRAWN AEROBIC AND ANAEROBIC 02/08/2015 2033   CULT  02/08/2015 2033    NO GROWTH 5 DAYS Performed at [facility]    CULT  02/08/2015 2033    NO GROWTH 5 DAYS Performed at [facility]    REPTSTATUS 02/13/2015 FINAL 02/08/2015  2033   REPTSTATUS 02/13/2015 FINAL 02/08/2015 2033     Radiological Exams on Admission: Dg Chest 2 View  03/18/2015  CLINICAL DATA:  Right side chest pain and shortness of breath for 2-3 days. Initial encounter. EXAM: CHEST  2 VIEW COMPARISON:  PA and lateral chest 02/08/2015.  CT chest 01/30/2015. FINDINGS: Scattered bulla are seen with pulmonary scar. No consolidative process, pneumothorax or effusion is identified. Heart size is normal. No focal bony abnormality. IMPRESSION: No acute abnormality. Bilateral pulmonary scarring scattered bullous change. Electronically Signed   By: Drusilla Kanner M.D.   On: 03/18/2015 20:25   Dg Shoulder Right  03/18/2015  CLINICAL DATA:  Initial valuation for acute right shoulder pain. EXAM: RIGHT SHOULDER - 2+ VIEW COMPARISON:  Prior radiograph from 02/08/2015. FINDINGS: No acute fracture or dislocation. Humeral head in normal alignment with the glenoid. AC joint approximated. No periarticular calcification. Osseous mineralization normal. IMPRESSION: No acute osseous abnormality about the shoulder. Electronically Signed   By: Rise Mu M.D.   On: 03/18/2015 22:45    Chart has been reviewed  Family not  at  Bedside     Assessment/Plan  39 year old male with history of sickle cell disease presents with shoulder pain consistent with sickle cell crisis  Present on Admission:  . Sickle cell pain crisis (HCC)-  will admit per sickle cell protocol, control pain, hydrate, provide oxygen.    Transfuse as needed if Hg drops significantly below baseline. If develops fever, hypoxia or respiratory symptoms would evaluate for acute chest and initiate antibiotics as needed. currently appears to be stable  Patient does not use morphine on a day-to-day basis but only while having an pain crisis. Will resume by mouth  long lasting morphine to prevent withdrawal. Order Dilaudid PCA for now avoid narcotic tolerant PCA protocol as patient have not been using  narcotics consistently . Tobacco abuse - Nicotine patch spoke about importance of discontinuation   Prophylaxis: Lovenox   CODE STATUS:  FULL CODE  Disposition:    To home once workup is complete and patient is stable  Other plan as per orders.  I have spent a total of 55 min on this admission  Kourtney Montesinos 03/19/2015, 12:16 AM  [facility]  Pager [phone]   after 2 AM please page floor coverage PA If 7AM-7PM, please contact the day team taking care of the patient  [email or web address]  Password [email or web address]

## 2015-03-19 NOTE — ED Notes (Signed)
Call to give report x 1. No one answered phone. Will retry.

## 2015-03-20 DIAGNOSIS — Z720 Tobacco use: Secondary | ICD-10-CM

## 2015-03-20 MED ORDER — LACTULOSE 10 GM/15ML PO SOLN
20.0000 g | Freq: Every day | ORAL | Status: DC | PRN
  Administered 2015-03-20 – 2015-03-21 (×2): 20 g via ORAL
  Filled 2015-03-20 (×2): qty 30

## 2015-03-20 MED ORDER — DEXTROSE-NACL 5-0.45 % IV SOLN
INTRAVENOUS | Status: DC
  Administered 2015-03-20 – 2015-03-22 (×2): via INTRAVENOUS

## 2015-03-20 MED ORDER — HYDROMORPHONE HCL 2 MG/ML IJ SOLN
2.0000 mg | INTRAMUSCULAR | Status: DC | PRN
Start: 2015-03-20 — End: 2015-03-22
  Administered 2015-03-21 – 2015-03-22 (×2): 2 mg via INTRAVENOUS
  Filled 2015-03-20 (×2): qty 1

## 2015-03-20 NOTE — Progress Notes (Signed)
SICKLE CELL SERVICE PROGRESS NOTE  [name] ZOX:[id number] DOB: 09-Sep-1975 DOA: 03/18/2015 PCP: Dorrene German, MD  Assessment/Plan: Active Problems:   Tobacco abuse   Sickle cell pain crisis (HCC)   Sickle cell crisis (HCC)  1. Hb [place] with crisis: This is an opiate tolerant patient who presents in crisis. He currently rates his pain at 7-8/10. He has used 24.8 mg in the last 24 hours. I will schedule clinician assisited doses at 1 mg IV every 3 hours as needed. Continue Toradol and continue IVF.     Code Status: Full Code Family Communication: N/A Disposition Plan: Not yet ready for discharge  Jashanti Clinkscale A.  Pager [phone]. If 7PM-7AM, please contact night-coverage.  03/20/2015, 12:24 PM  LOS: 1 day    Consultants:  None  Procedures:  None  Antibiotics:  None   Objective: Filed Vitals:   03/20/15 0445 03/20/15 0719 03/20/15 0735 03/20/15 0937  BP:  117/74  123/76  Pulse:  77  70  Temp:  98.1 F (36.7 C)  97.7 F (36.5 C)  TempSrc:  Oral  Oral  Resp: Height:      Weight:  173 lb 15.1 oz (78.9 kg)    SpO2: 93% 93% 94% 96%   Weight change:   Intake/Output Summary (Last 24 hours) at 03/20/15 1224 Last data filed at 03/20/15 8295  Gross per 24 hour  Intake      0 ml  Output   3250 ml  Net  -3250 ml    General: Alert, awake, oriented x3, in no apparent distress. HEENT: [place]/AT PEERL, EOMI, anicteric.  Neck: Trachea midline,  no masses, no thyromegal,y no JVD, no carotid bruit OROPHARYNX:  Moist, No exudate/ erythema/lesions.  Heart: Regular rate and rhythm, without murmurs, rubs, gallops, PMI non-displaced, no heaves or thrills on palpation.  Lungs: Clear to auscultation, no wheezing or rhonchi noted. No increased vocal fremitus resonant to percussion  Abdomen: Soft, nontender, nondistended, positive bowel sounds, no masses no hepatosplenomegaly noted.  Neuro: No focal neurological deficits noted cranial nerves II through XII  grossly intact.  Strength at baseline in bilateral upper and lower extremities. Musculoskeletal: No warm swelling or erythema around joints, no spinal tenderness noted. Psychiatric: Patient alert and oriented x3, good insight and cognition, good recent to remote recall.    Data Reviewed: Basic Metabolic Panel:  Recent Labs Lab 03/18/15 1937 03/19/15 0448  NA 137 141  K 3.5 3.9  CL 103 107  CO2 25 26  GLUCOSE 100* 94  BUN 8 10  CREATININE 0.73 0.78  CALCIUM 9.6 9.1  MG  --  2.1  PHOS  --  5.0*   Liver Function Tests:  Recent Labs Lab 03/18/15 1937 03/19/15 0448  AST 21 17  ALT 12* 11*  ALKPHOS 90 77  BILITOT 2.5* 2.3*  PROT 8.6* 7.5  ALBUMIN 4.9 4.2   No results for input(s): LIPASE, AMYLASE in the last 168 hours. No results for input(s): AMMONIA in the last 168 hours. CBC:  Recent Labs Lab 03/18/15 1937 03/19/15 0448  WBC 10.2 8.8  NEUTROABS 7.0 3.7  HGB 13.1 11.2*  HCT 35.5* 31.0*  MCV 92.9 92.5  PLT 308 266   Cardiac Enzymes: No results for input(s): CKTOTAL, CKMB, CKMBINDEX, TROPONINI in the last 168 hours. BNP (last 3 results) No results for input(s): BNP in the last 8760 hours.  ProBNP (last 3 results) No results for input(s): PROBNP in the last 8760 hours.  CBG:  No results for input(s): GLUCAP in the last 168 hours.  No results found for this or any previous visit (from the past 240 hour(s)).   Studies: Dg Chest 2 View  03/18/2015  CLINICAL DATA:  Right side chest pain and shortness of breath for 2-3 days. Initial encounter. EXAM: CHEST  2 VIEW COMPARISON:  PA and lateral chest 02/08/2015.  CT chest 01/30/2015. FINDINGS: Scattered bulla are seen with pulmonary scar. No consolidative process, pneumothorax or effusion is identified. Heart size is normal. No focal bony abnormality. IMPRESSION: No acute abnormality. Bilateral pulmonary scarring scattered bullous change. Electronically Signed   By: Drusilla Kannerhomas  Dalessio M.D.   On: 03/18/2015 20:25    Dg Shoulder Right  03/18/2015  CLINICAL DATA:  Initial valuation for acute right shoulder pain. EXAM: RIGHT SHOULDER - 2+ VIEW COMPARISON:  Prior radiograph from 02/08/2015. FINDINGS: No acute fracture or dislocation. Humeral head in normal alignment with the glenoid. AC joint approximated. No periarticular calcification. Osseous mineralization normal. IMPRESSION: No acute osseous abnormality about the shoulder. Electronically Signed   By: Rise MuBenjamin  McClintock M.D.   On: 03/18/2015 22:45    Scheduled Meds: . enoxaparin (LOVENOX) injection  40 mg Subcutaneous Q24H  . folic acid  1 mg Oral Daily  . HYDROmorphone   Intravenous 6 times per day  . ketorolac  30 mg Intravenous 4 times per day  . morphine  60 mg Oral BID  . nicotine  21 mg Transdermal Daily  . senna-docusate  1 tablet Oral BID   Continuous Infusions:    Time spent 25 minutes.

## 2015-03-21 LAB — BASIC METABOLIC PANEL
Anion gap: 9 (ref 5–15)
BUN: 7 mg/dL (ref 6–20)
CALCIUM: 9.3 mg/dL (ref 8.9–10.3)
CHLORIDE: 105 mmol/L (ref 101–111)
CO2: 26 mmol/L (ref 22–32)
CREATININE: 0.74 mg/dL (ref 0.61–1.24)
GFR calc Af Amer: >60 mL/min (ref >60)
GFR calc non Af Amer: >60 mL/min (ref >60)
GLUCOSE: 83 mg/dL (ref 65–99)
Potassium: 4 mmol/L (ref 3.5–5.1)
Sodium: 140 mmol/L (ref 135–145)

## 2015-03-21 LAB — CBC WITH DIFFERENTIAL/PLATELET
BASOS ABS: 0.1 10*3/uL (ref 0.0–0.1)
Basophils Relative: 1 %
EOS ABS: 1.1 10*3/uL — AB (ref 0.0–0.7)
Eosinophils Relative: 10 %
HCT: 30.9 % — ABNORMAL LOW (ref 39.0–52.0)
HEMOGLOBIN: 11.4 g/dL — AB (ref 13.0–17.0)
LYMPHS ABS: 2.7 10*3/uL (ref 0.7–4.0)
LYMPHS PCT: 26 %
MCH: 33.9 pg (ref 26.0–34.0)
MCHC: 36.9 g/dL — AB (ref 30.0–36.0)
MCV: 92 fL (ref 78.0–100.0)
MONO ABS: 1.3 10*3/uL — AB (ref 0.1–1.0)
Monocytes Relative: 12 %
NEUTROS ABS: 5.3 10*3/uL (ref 1.7–7.7)
Neutrophils Relative %: 51 %
PLATELETS: 267 10*3/uL (ref 150–400)
RBC: 3.36 MIL/uL — ABNORMAL LOW (ref 4.22–5.81)
RDW: 14.8 % (ref 11.5–15.5)
WBC: 10.5 10*3/uL (ref 4.0–10.5)

## 2015-03-21 LAB — RETICULOCYTES
RBC.: 3.36 MIL/uL — AB (ref 4.22–5.81)
RETIC CT PCT: 3.7 % — AB (ref 0.4–3.1)
Retic Count, Absolute: 124.3 10*3/uL (ref 19.0–186.0)

## 2015-03-21 NOTE — Progress Notes (Signed)
SICKLE CELL SERVICE PROGRESS NOTE  [name] WUJ:[id number] DOB: 10/16/1975 DOA: 03/18/2015 PCP: Dorrene German, MD  Assessment/Plan: Active Problems:   Tobacco abuse   Sickle cell pain crisis (HCC)   Sickle cell crisis (HCC)  1. Hb [place] with crisis: This is an opiate tolerant patient who presents in crisis. He currently rates his pain at 7-8/10. He has used 30.93 mg with 60 demands and 52 deliveries in the last 24 hours. He has clinician assisited doses at 1 mg IV every 3 hours as needed. Continue Toradol and continue IVF. Goal for pain is 4 out of 10 2. Sickle cell anemia: Patient's hemoglobin is at his baseline. We will continue monitoring. 3. Tobacco abuse: Counseling provided. Nicotine patch offered.    Code Status: Full Code Family Communication: N/A Disposition Plan: Not yet ready for discharge  [facility]  Pager [phone]. If 7PM-7AM, please contact night-coverage.  03/21/2015, 7:29 AM  LOS: 2 days    Consultants:  None  Procedures:  None  Antibiotics:  None   Objective: Filed Vitals:   03/20/15 2356 03/21/15 0101 03/21/15 0358 03/21/15 0526  BP:  109/66  130/82  Pulse:  67  64  Temp:  98.3 F (36.8 C)  98.3 F (36.8 C)  TempSrc:  Oral  Oral  Resp: Height:      Weight:    78.971 kg (174 lb 1.6 oz)  SpO2: 94% 94% 93% 97%   Weight change:   Intake/Output Summary (Last 24 hours) at 03/21/15 0729 Last data filed at 03/21/15 8657  Gross per 24 hour  Intake   1020 ml  Output   2800 ml  Net  -1780 ml    General: Alert, awake, oriented x3, in no apparent distress. HEENT: [place]/AT PEERL, EOMI, anicteric.  Neck: Trachea midline,  no masses, no thyromegal,y no JVD, no carotid bruit OROPHARYNX:  Moist, No exudate/ erythema/lesions.  Heart: Regular rate and rhythm, without murmurs, rubs, gallops, PMI non-displaced, no heaves or thrills on palpation.  Lungs: Clear to auscultation, no wheezing or rhonchi noted. No increased vocal fremitus  resonant to percussion  Abdomen: Soft, nontender, nondistended, positive bowel sounds, no masses no hepatosplenomegaly noted.  Neuro: No focal neurological deficits noted cranial nerves II through XII grossly intact.  Strength at baseline in bilateral upper and lower extremities. Musculoskeletal: No warm swelling or erythema around joints, no spinal tenderness noted. Psychiatric: Patient alert and oriented x3, good insight and cognition, good recent to remote recall.    Data Reviewed: Basic Metabolic Panel:  Recent Labs Lab 03/18/15 1937 03/19/15 0448 03/21/15 0426  NA 137 141 140  K 3.5 3.9 4.0  CL 103 107 105  CO2 GLUCOSE 100* 94 83  BUN CREATININE 0.73 0.78 0.74  CALCIUM 9.6 9.1 9.3  MG  --  2.1  --   PHOS  --  5.0*  --    Liver Function Tests:  Recent Labs Lab 03/18/15 1937 03/19/15 0448  AST 21 17  ALT 12* 11*  ALKPHOS 90 77  BILITOT 2.5* 2.3*  PROT 8.6* 7.5  ALBUMIN 4.9 4.2   No results for input(s): LIPASE, AMYLASE in the last 168 hours. No results for input(s): AMMONIA in the last 168 hours. CBC:  Recent Labs Lab 03/18/15 1937 03/19/15 0448 03/21/15 0426  WBC 10.2 8.8 10.5  NEUTROABS 7.0 3.7 5.3  HGB 13.1 11.2* 11.4*  HCT 35.5* 31.0* 30.9*  MCV 92.9 92.5  92.0  PLT 308 266 267   Cardiac Enzymes: No results for input(s): CKTOTAL, CKMB, CKMBINDEX, TROPONINI in the last 168 hours. BNP (last 3 results) No results for input(s): BNP in the last 8760 hours.  ProBNP (last 3 results) No results for input(s): PROBNP in the last 8760 hours.  CBG: No results for input(s): GLUCAP in the last 168 hours.  No results found for this or any previous visit (from the past 240 hour(s)).   Studies: Dg Chest 2 View  03/18/2015  CLINICAL DATA:  Right side chest pain and shortness of breath for 2-3 days. Initial encounter. EXAM: CHEST  2 VIEW COMPARISON:  PA and lateral chest 02/08/2015.  CT chest 01/30/2015. FINDINGS: Scattered bulla are seen  with pulmonary scar. No consolidative process, pneumothorax or effusion is identified. Heart size is normal. No focal bony abnormality. IMPRESSION: No acute abnormality. Bilateral pulmonary scarring scattered bullous change. Electronically Signed   By: Drusilla Kannerhomas  Dalessio M.D.   On: 03/18/2015 20:25   Dg Shoulder Right  03/18/2015  CLINICAL DATA:  Initial valuation for acute right shoulder pain. EXAM: RIGHT SHOULDER - 2+ VIEW COMPARISON:  Prior radiograph from 02/08/2015. FINDINGS: No acute fracture or dislocation. Humeral head in normal alignment with the glenoid. AC joint approximated. No periarticular calcification. Osseous mineralization normal. IMPRESSION: No acute osseous abnormality about the shoulder. Electronically Signed   By: Rise MuBenjamin  McClintock M.D.   On: 03/18/2015 22:45    Scheduled Meds: . enoxaparin (LOVENOX) injection  40 mg Subcutaneous Q24H  . folic acid  1 mg Oral Daily  . HYDROmorphone   Intravenous 6 times per day  . ketorolac  30 mg Intravenous 4 times per day  . morphine  60 mg Oral BID  . nicotine  21 mg Transdermal Daily  . senna-docusate  1 tablet Oral BID   Continuous Infusions: . dextrose 5 % and 0.45% NaCl 50 mL/hr at 03/20/15 1500    Time spent 25 minutes.

## 2015-03-22 NOTE — Progress Notes (Signed)
Date: March 22, 2015 Chart reviewed for concurrent status and case management needs. Will continue to follow patient for changes and needs: Rhonda Davis, RN, BSN, CCM   [phone] 

## 2015-03-22 NOTE — Discharge Summary (Signed)
Physician Discharge Summary  Patient ID: Malik PortHoward E [name] MRN: [id number] DOB/AGE: Jul 10, 1975 39 y.o.  Admit date: 03/18/2015 Discharge date: 03/22/2015  Admission Diagnoses:  Discharge Diagnoses:  Active Problems:   Tobacco abuse   Sickle cell pain crisis (HCC)   Sickle cell crisis [facility](HCC)   Discharged Condition: good  Hospital Course: Patient was admitted with sickle cell painful crisis. He was treated with IV Dilaudid PCA, Toradol and IV fluids. He is opiates tolerant on morphine sulfate at home. He was gradually transition to his oral regimen. Patient was doing much better at the time of discharge. He was discharged to follow-up with his primary care physician and to resume his home medications.  Consults: None  Significant Diagnostic Studies: labs: Serial CBCs and CMP is where checked. All within normal limits.  Treatments: IV hydration and analgesia: Dilaudid  Discharge Exam: Blood pressure 106/69, pulse 57, temperature 98.1 F (36.7 C), temperature source Oral, resp. rate 11, height 6\' 2"  (1.88 m), weight 78.971 kg (174 lb 1.6 oz), SpO2 94 %. General appearance: alert, cooperative and no distress Neck: no adenopathy, no carotid bruit, no JVD, supple, symmetrical, trachea midline and thyroid not enlarged, symmetric, no tenderness/mass/nodules Back: symmetric, no curvature. ROM normal. No CVA tenderness. Resp: clear to auscultation bilaterally Chest wall: no tenderness GI: soft, non-tender; bowel sounds normal; no masses,  no organomegaly Extremities: extremities normal, atraumatic, no cyanosis or edema Pulses: 2+ and symmetric Lymph nodes: Cervical, supraclavicular, and axillary nodes normal.  Disposition: 01-Home or Self Care     Medication List    TAKE these medications        folic acid 1 MG tablet  Commonly known as:  FOLVITE  Take 1 mg by mouth daily.     morphine 30 MG tablet  Commonly known as:  MSIR  Take 30 mg by mouth every 8 (eight) hours as needed  for severe pain (pain).     morphine 60 MG 12 hr tablet  Commonly known as:  MS CONTIN  Take 1 tablet (60 mg total) by mouth 2 (two) times daily.     promethazine 25 MG tablet  Commonly known as:  PHENERGAN  Take 25 mg by mouth every 6 (six) hours as needed for nausea (nausea).         SignedLonia Blood: [name],[name] 03/22/2015, 2:34 PM Time spent 32 minutes

## 2015-03-22 NOTE — Progress Notes (Signed)
Patient discharged home in stable condition. Discharge instructions given. Pt verbalized understanding.  

## 2015-05-25 ENCOUNTER — Emergency Department (HOSPITAL_COMMUNITY): Payer: Medicare Other

## 2015-05-25 ENCOUNTER — Inpatient Hospital Stay (HOSPITAL_COMMUNITY)
Admission: EM | Admit: 2015-05-25 | Discharge: 2015-05-29 | DRG: 812 | Disposition: A | Discharge status: Home / Self Care | Payer: Medicare Other | Attending: Internal Medicine | Admitting: Internal Medicine

## 2015-05-25 ENCOUNTER — Encounter (HOSPITAL_COMMUNITY): Payer: Self-pay | Admitting: *Deleted

## 2015-05-25 DIAGNOSIS — E860 Dehydration: Secondary | ICD-10-CM | POA: Diagnosis present

## 2015-05-25 DIAGNOSIS — D72829 Elevated white blood cell count, unspecified: Secondary | ICD-10-CM | POA: Diagnosis present

## 2015-05-25 DIAGNOSIS — D638 Anemia in other chronic diseases classified elsewhere: Secondary | ICD-10-CM | POA: Diagnosis present

## 2015-05-25 DIAGNOSIS — M549 Dorsalgia, unspecified: Secondary | ICD-10-CM | POA: Diagnosis not present

## 2015-05-25 DIAGNOSIS — Z720 Tobacco use: Secondary | ICD-10-CM | POA: Diagnosis present

## 2015-05-25 DIAGNOSIS — M879 Osteonecrosis, unspecified: Secondary | ICD-10-CM | POA: Diagnosis present

## 2015-05-25 DIAGNOSIS — F17210 Nicotine dependence, cigarettes, uncomplicated: Secondary | ICD-10-CM | POA: Diagnosis present

## 2015-05-25 DIAGNOSIS — Z79899 Other long term (current) drug therapy: Secondary | ICD-10-CM | POA: Diagnosis not present

## 2015-05-25 DIAGNOSIS — K5900 Constipation, unspecified: Secondary | ICD-10-CM | POA: Diagnosis present

## 2015-05-25 DIAGNOSIS — Z96641 Presence of right artificial hip joint: Secondary | ICD-10-CM | POA: Diagnosis present

## 2015-05-25 DIAGNOSIS — D5700 Hb-SS disease with crisis, unspecified: Principal | ICD-10-CM | POA: Diagnosis present

## 2015-05-25 DIAGNOSIS — K5909 Other constipation: Secondary | ICD-10-CM | POA: Diagnosis not present

## 2015-05-25 LAB — COMPREHENSIVE METABOLIC PANEL
ALK PHOS: 78 U/L (ref 38–126)
ALT: 15 U/L — AB (ref 17–63)
ANION GAP: 12 (ref 5–15)
AST: 35 U/L (ref 15–41)
Albumin: 4.8 g/dL (ref 3.5–5.0)
BILIRUBIN TOTAL: 2 mg/dL — AB (ref 0.3–1.2)
BUN: 14 mg/dL (ref 6–20)
CALCIUM: 9.5 mg/dL (ref 8.9–10.3)
CO2: 22 mmol/L (ref 22–32)
Chloride: 104 mmol/L (ref 101–111)
Creatinine, Ser: 0.82 mg/dL (ref 0.61–1.24)
Glucose, Bld: 79 mg/dL (ref 65–99)
Potassium: 4.8 mmol/L (ref 3.5–5.1)
Sodium: 138 mmol/L (ref 135–145)
Total Protein: 8.4 g/dL — ABNORMAL HIGH (ref 6.5–8.1)

## 2015-05-25 LAB — CBC WITH DIFFERENTIAL/PLATELET
BASOS ABS: 0.1 10*3/uL (ref 0.0–0.1)
BASOS PCT: 1 %
EOS ABS: 0.7 10*3/uL (ref 0.0–0.7)
Eosinophils Relative: 5 %
HCT: 33.2 % — ABNORMAL LOW (ref 39.0–52.0)
HEMOGLOBIN: 11.9 g/dL — AB (ref 13.0–17.0)
LYMPHS PCT: 19 %
Lymphs Abs: 2.6 10*3/uL (ref 0.7–4.0)
MCH: 32.6 pg (ref 26.0–34.0)
MCHC: 35.8 g/dL (ref 30.0–36.0)
MCV: 91 fL (ref 78.0–100.0)
Monocytes Absolute: 1.4 10*3/uL — ABNORMAL HIGH (ref 0.1–1.0)
Monocytes Relative: 10 %
NEUTROS PCT: 65 %
Neutro Abs: 8.8 10*3/uL — ABNORMAL HIGH (ref 1.7–7.7)
Platelets: 299 10*3/uL (ref 150–400)
RBC: 3.65 MIL/uL — AB (ref 4.22–5.81)
RDW: 14.6 % (ref 11.5–15.5)
WBC: 13.6 10*3/uL — AB (ref 4.0–10.5)

## 2015-05-25 LAB — RETICULOCYTES
RBC.: 3.62 MIL/uL — AB (ref 4.22–5.81)
RBC.: 3.35 MIL/uL — ABNORMAL LOW (ref 4.22–5.81)
RETIC COUNT ABSOLUTE: 86.9 10*3/uL (ref 19.0–186.0)
RETIC COUNT ABSOLUTE: 77.1 10*3/uL (ref 19.0–186.0)
RETIC CT PCT: 2.4 % (ref 0.4–3.1)
Retic Ct Pct: 2.3 % (ref 0.4–3.1)

## 2015-05-25 LAB — TROPONIN I: Troponin I: <0.03 ng/mL (ref <0.031)

## 2015-05-25 LAB — LACTATE DEHYDROGENASE: LDH: 136 U/L (ref 98–192)

## 2015-05-25 LAB — MAGNESIUM: Magnesium: 2.1 mg/dL (ref 1.7–2.4)

## 2015-05-25 MED ORDER — SODIUM CHLORIDE 0.9% FLUSH: 9.0000 mL | INTRAVENOUS | Status: DC | PRN

## 2015-05-25 MED ORDER — MORPHINE SULFATE ER 30 MG PO TBCR
60.0000 mg | EXTENDED_RELEASE_TABLET | Freq: Two times a day (BID) | ORAL | Status: DC
  Administered 2015-05-25 – 2015-05-29 (×8): 60 mg via ORAL
  Filled 2015-05-25 (×9): qty 2

## 2015-05-25 MED ORDER — KETOROLAC TROMETHAMINE 15 MG/ML IJ SOLN
15.0000 mg | Freq: Once | INTRAMUSCULAR | Status: AC
  Administered 2015-05-25: 15 mg via INTRAVENOUS
  Filled 2015-05-25: qty 1

## 2015-05-25 MED ORDER — ONDANSETRON HCL 4 MG/2ML IJ SOLN
4.0000 mg | Freq: Once | INTRAMUSCULAR | Status: AC
  Administered 2015-05-25: 4 mg via INTRAVENOUS
  Filled 2015-05-25: qty 2

## 2015-05-25 MED ORDER — MORPHINE SULFATE CR 60 MG PO TB12: 60.0000 mg | ORAL_TABLET | Freq: Two times a day (BID) | ORAL | Status: DC

## 2015-05-25 MED ORDER — HYDROMORPHONE HCL 2 MG/ML IJ SOLN
0.0313 mg/kg | INTRAMUSCULAR | Status: AC
  Filled 2015-05-25: qty 2

## 2015-05-25 MED ORDER — HYDROMORPHONE HCL 2 MG/ML IJ SOLN: 0.0375 mg/kg | INTRAMUSCULAR | Status: AC

## 2015-05-25 MED ORDER — FOLIC ACID 1 MG PO TABS
1.0000 mg | ORAL_TABLET | Freq: Every day | ORAL | Status: DC
  Administered 2015-05-25 – 2015-05-29 (×5): 1 mg via ORAL
  Filled 2015-05-25 (×5): qty 1

## 2015-05-25 MED ORDER — SENNOSIDES-DOCUSATE SODIUM 8.6-50 MG PO TABS
1.0000 | ORAL_TABLET | Freq: Two times a day (BID) | ORAL | Status: DC
  Administered 2015-05-25 – 2015-05-29 (×9): 1 via ORAL
  Filled 2015-05-25 (×9): qty 1

## 2015-05-25 MED ORDER — HYDROMORPHONE HCL 2 MG/ML IJ SOLN: 0.0250 mg/kg | INTRAMUSCULAR | Status: AC

## 2015-05-25 MED ORDER — HYDROMORPHONE HCL 2 MG/ML IJ SOLN
0.0313 mg/kg | INTRAMUSCULAR | Status: AC
  Administered 2015-05-25: 2.3 mg via INTRAVENOUS
  Filled 2015-05-25: qty 2

## 2015-05-25 MED ORDER — MORPHINE SULFATE 30 MG PO TABS: 30.0000 mg | ORAL_TABLET | Freq: Two times a day (BID) | ORAL | Status: DC | PRN

## 2015-05-25 MED ORDER — DEXTROSE-NACL 5-0.45 % IV SOLN
INTRAVENOUS | Status: DC
  Administered 2015-05-25 – 2015-05-28 (×5): via INTRAVENOUS
  Administered 2015-05-28: 1000 mL via INTRAVENOUS

## 2015-05-25 MED ORDER — PROMETHAZINE HCL 25 MG PO TABS
25.0000 mg | ORAL_TABLET | Freq: Four times a day (QID) | ORAL | Status: DC | PRN
  Administered 2015-05-25 – 2015-05-28 (×7): 25 mg via ORAL
  Filled 2015-05-25 (×7): qty 1

## 2015-05-25 MED ORDER — POLYETHYLENE GLYCOL 3350 17 G PO PACK: 17.0000 g | PACK | Freq: Every day | ORAL | Status: DC | PRN

## 2015-05-25 MED ORDER — DIPHENHYDRAMINE HCL 25 MG PO CAPS: 25.0000 mg | ORAL_CAPSULE | ORAL | Status: DC | PRN

## 2015-05-25 MED ORDER — HYDROMORPHONE HCL 2 MG/ML IJ SOLN
0.0250 mg/kg | INTRAMUSCULAR | Status: AC
Start: 2015-05-25 — End: 2015-05-25
  Administered 2015-05-25: 1.9 mg via INTRAVENOUS
  Filled 2015-05-25: qty 1

## 2015-05-25 MED ORDER — SODIUM CHLORIDE 0.45 % IV SOLN
INTRAVENOUS | Status: DC
  Administered 2015-05-25 (×2): via INTRAVENOUS

## 2015-05-25 MED ORDER — HYDROMORPHONE 1 MG/ML IV SOLN
INTRAVENOUS | Status: DC
  Administered 2015-05-25: 5.5 mg via INTRAVENOUS
  Administered 2015-05-25: 3 mg via INTRAVENOUS
  Administered 2015-05-25: 15:00:00 via INTRAVENOUS
  Administered 2015-05-26: 4.5 mg via INTRAVENOUS
  Administered 2015-05-26: 09:00:00 via INTRAVENOUS
  Administered 2015-05-26: 5 mg via INTRAVENOUS
  Administered 2015-05-26: 3.5 mg via INTRAVENOUS
  Administered 2015-05-26: 4.5 mg via INTRAVENOUS
  Administered 2015-05-26: 3 mg via INTRAVENOUS
  Administered 2015-05-26: 5.49 mg via INTRAVENOUS
  Administered 2015-05-27: 10 mg via INTRAVENOUS
  Administered 2015-05-27: 4.5 mg via INTRAVENOUS
  Administered 2015-05-27: 07:00:00 via INTRAVENOUS
  Administered 2015-05-27: 3 mg via INTRAVENOUS
  Administered 2015-05-27: 6 mg via INTRAVENOUS
  Administered 2015-05-28: 2.5 mg via INTRAVENOUS
  Administered 2015-05-28: 7 mg via INTRAVENOUS
  Administered 2015-05-28: 18:00:00 via INTRAVENOUS
  Administered 2015-05-28: 3 mg via INTRAVENOUS
  Administered 2015-05-28: 7.5 mg via INTRAVENOUS
  Administered 2015-05-28: 4.03 mL via INTRAVENOUS
  Administered 2015-05-29: 5.5 mg via INTRAVENOUS
  Administered 2015-05-29: 16 mg via INTRAVENOUS
  Administered 2015-05-29: 2 mg via INTRAVENOUS
  Administered 2015-05-29: 08:00:00 via INTRAVENOUS
  Filled 2015-05-25 (×6): qty 25

## 2015-05-25 MED ORDER — ENOXAPARIN SODIUM 40 MG/0.4ML ~~LOC~~ SOLN
40.0000 mg | SUBCUTANEOUS | Status: DC
  Administered 2015-05-25 – 2015-05-28 (×4): 40 mg via SUBCUTANEOUS
  Filled 2015-05-25 (×4): qty 0.4

## 2015-05-25 MED ORDER — HYDROMORPHONE HCL 2 MG/ML IJ SOLN
0.0375 mg/kg | INTRAMUSCULAR | Status: AC
  Administered 2015-05-25: 2.8 mg via INTRAVENOUS
  Filled 2015-05-25: qty 2

## 2015-05-25 MED ORDER — NALOXONE HCL 0.4 MG/ML IJ SOLN: 0.4000 mg | INTRAMUSCULAR | Status: DC | PRN

## 2015-05-25 NOTE — H&P (Signed)
[name] is an 40 y.o. male.   Chief Complaint: Pain in back and legs HPI: This is a 40 year old gentleman with history of sickle cell disease presenting to the ER with 2 days of pain in his back and legs. Pain is rated as 10 out of 10 worsened with movement and not relieved by anything including his home medications. He has taken the medicine the last 2 days but pain is still persistent. He denied any shortness of breath no cough no nausea vomiting or diarrhea. Patient has been seen in the ER and initial care included IV Dilaudid up to 6 mg with no relief. He is therefore being admitted to the hospital for management of his sickle cell crisis.  Past Medical History  Diagnosis Date  . Sickle cell crisis ([place])   . Blood transfusion   . 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 Infection of bone, shoulder region ([place])     left shoulder  . Pneumonia   . 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 Orif right hip fracture  1995  . Joint replacement  2006    right total hip arthroplasty  . Bone graft hip iliac crest      Family History  Problem Relation Age of Onset  . Adopted: Yes   Social History:  reports that he has been smoking Cigarettes.  He has been smoking about 0.50 packs per day. He has never used smokeless tobacco. He reports that he does not drink alcohol or use illicit drugs.  Allergies: No Known Allergies   (Not in a hospital admission)  Results for orders placed or performed during the hospital encounter of 05/25/15 (from the past 48 hour(s))  Comprehensive metabolic panel     Status: Abnormal   Collection Time: 05/25/15 10:35 AM  Result Value Ref Range   Sodium 138 135 - 145 mmol/L   Potassium 4.8 3.5 - 5.1 mmol/L   Chloride 104 101 - 111 mmol/L   CO2 22 22 - 32 mmol/L   Glucose, Bld 79 65 - 99 mg/dL   BUN 14 6 - 20 mg/dL   Creatinine, Ser 0.82 0.61 - 1.24 mg/dL   Calcium 9.5 8.9 - 10.3  mg/dL   Total Protein 8.4 (H) 6.5 - 8.1 g/dL   Albumin 4.8 3.5 - 5.0 g/dL   AST 35 15 - 41 U/L   ALT 15 (L) 17 - 63 U/L   Alkaline Phosphatase 78 38 - 126 U/L   Total Bilirubin 2.0 (H) 0.3 - 1.2 mg/dL   GFR calc non Af Amer >60 >60 mL/min   GFR calc Af Amer >60 >60 mL/min    Comment: (NOTE) The eGFR has been calculated using the CKD EPI equation. This calculation has not been validated in all clinical situations. eGFR's persistently <60 mL/min signify possible Chronic Kidney Disease.    Anion gap 12 5 - 15  CBC with Differential     Status: Abnormal   Collection Time: 05/25/15 10:35 AM  Result Value Ref Range   WBC 13.6 (H) 4.0 - 10.5 K/uL   RBC 3.65 (L) 4.22 - 5.81 MIL/uL   Hemoglobin 11.9 (L) 13.0 - 17.0 g/dL   HCT 33.2 (L) 39.0 - 52.0 %   MCV 91.0 78.0 - 100.0 fL   MCH 32.6 26.0 - 34.0 pg   MCHC 35.8 30.0 - 36.0 g/dL   RDW 14.6 11.5 -  15.5 %   Platelets 299 150 - 400 K/uL   Neutrophils Relative % 65 %   Lymphocytes Relative 19 %   Monocytes Relative 10 %   Eosinophils Relative 5 %   Basophils Relative 1 %   Neutro Abs 8.8 (H) 1.7 - 7.7 K/uL   Lymphs Abs 2.6 0.7 - 4.0 K/uL   Monocytes Absolute 1.4 (H) 0.1 - 1.0 K/uL   Eosinophils Absolute 0.7 0.0 - 0.7 K/uL   Basophils Absolute 0.1 0.0 - 0.1 K/uL   RBC Morphology RARE NRBCs     Comment: SICKLE CELLS TARGET CELLS POLYCHROMASIA PRESENT   Troponin I     Status: None   Collection Time: 05/25/15 10:35 AM  Result Value Ref Range   Troponin I <0.03 <0.031 ng/mL    Comment:        NO INDICATION OF MYOCARDIAL INJURY.   Reticulocytes     Status: Abnormal   Collection Time: 05/25/15 10:36 AM  Result Value Ref Range   Retic Ct Pct 2.4 0.4 - 3.1 %   RBC. 3.62 (L) 4.22 - 5.81 MIL/uL   Retic Count, Manual 86.9 19.0 - 186.0 K/uL   Dg Chest 2 View  05/25/2015  CLINICAL DATA:  Sickle cell crisis. Right shoulder and chest pain for the past 3 days. History of pneumonia and smoking. EXAM: CHEST  2 VIEW COMPARISON:  01/29/2015;  02/06/2014; 04/28/2012; chest CT - 01/30/2015 FINDINGS: Grossly unchanged cardiac silhouette and mediastinal contours. The lungs remain hyperexpanded. Right mid and bibasilar linear inches opacities are unchanged and favored to represent atelectasis or scar. No new focal airspace opacities. No pleural effusion or pneumothorax. No evidence of edema. No acute osseous abnormalities. IMPRESSION: Similar findings of lung hyper expansion and bilateral atelectasis/scar without acute cardiopulmonary disease. Electronically Signed   By: Sandi Mariscal M.D.   On: 05/25/2015 11:46    Review of Systems  Constitutional: Negative.   HENT: Negative.   Eyes: Negative.   Respiratory: Negative.   Cardiovascular: Negative.   Gastrointestinal: Negative.   Genitourinary: Negative.   Musculoskeletal: Positive for myalgias, back pain and joint pain.  Skin: Negative.   Neurological: Negative.   Endo/Heme/Allergies: Negative.   Psychiatric/Behavioral: Negative.     Blood pressure 110/52, pulse 52, temperature 97.7 F (36.5 C), temperature source Oral, resp. rate 12, height 6' 2"  (1.88 m), weight 74.844 kg (165 lb), SpO2 98 %. Physical Exam   Assessment/Plan A 40 year old gentleman being admitted with sickle cell painful crisis. #1 sickle cell painful crisis: Patient will be placed on IV Dilaudid PCA with Toradol and IV fluids. He is on MS Contin at home that I will continue with. We will reevaluate his pain in the morning and make adjustments as necessary. #2 sickle cell anemia: H&H is stable so far. Continue close monitoring. #3 constipation: Continue with laxatives. #4 dehydration: Continue his close hydration.   Normagene Harvie,[name] 05/25/2015, 2:14 PM

## 2015-05-25 NOTE — ED Notes (Signed)
Bed: WLPT1 Expected date:  Expected time:  Means of arrival:  Comments: Patient in room

## 2015-05-25 NOTE — ED Notes (Signed)
Pt reports pain in his R shoulder all the way down to his R side x 3 days.  Pt reports sickle cell crisis.

## 2015-05-25 NOTE — ED Provider Notes (Signed)
CSN: [id number]     Arrival date & time 05/25/15  0941 History   First MD Initiated Contact with Patient 05/25/15 1008     Chief Complaint  Patient presents with  . Sickle Cell Pain Crisis     Patient is a 40 y.o. male presenting with sickle cell pain. The history is provided by the patient. No language interpreter was used.  Sickle Cell Pain Crisis  EVO ADERMAN is a 40 y.o. male who presents to the Emergency Department complaining of  Sickle cell pain crisis. He reports 2-3 days of right-sided shoulder and rib cage pain. He states his pain typical for his pain crisis. He denies any fevers, cough, shortness of breath, abdominal pain, nausea, vomiting, dysuria, hematuria, diarrhea. He is taking his home pain medications without any difficulty. He denies any new injuries or recent illnesses. He states this is typical pain for a pain crisis for him.  Past Medical History  Diagnosis Date  . Sickle cell crisis (HCC)   . Blood transfusion   . Avascular necrosis of hip (HCC)     bilateral  . Infection of bone, shoulder region (HCC)     left shoulder  . Pneumonia   . Avascular necrosis of hip, left (HCC) 08/27/2011   Past Surgical History  Procedure Laterality Date  . Orif right hip fracture  1995  . Joint replacement  2006    right total hip arthroplasty  . Bone graft hip iliac crest     Family History  Problem Relation Age of Onset  . Adopted: Yes   Social History  Substance Use Topics  . Smoking status: Current Some Day Smoker -- 0.50 packs/day    Types: Cigarettes    Last Attempt to Quit: 01/27/2012  . Smokeless tobacco: Never Used  . Alcohol Use: No    Review of Systems  All other systems reviewed and are negative.     Allergies  Review of patient's allergies indicates no known allergies.  Home Medications   Prior to Admission medications   Medication Sig Start Date End Date Taking? Authorizing Provider  folic acid (FOLVITE) 1 MG tablet Take 1 mg by mouth daily.    Yes Historical Provider, MD  ibuprofen (ADVIL,MOTRIN) 800 MG tablet TAKE 1 TABLET BY MOUTH THREE TIMES DAILY AS NEEDED FOR PAIN 05/13/15  Yes Historical Provider, MD  morphine (MS CONTIN) 60 MG 12 hr tablet Take 1 tablet (60 mg total) by mouth 2 (two) times daily. Patient taking differently: Take 60 mg by mouth 2 (two) times daily as needed for pain.  11/20/11  Yes Grayce Sessions, NP  morphine (MSIR) 30 MG tablet Take 30 mg by mouth 2 (two) times daily as needed for severe pain (pain).    Yes Historical Provider, MD  promethazine (PHENERGAN) 25 MG tablet Take 25 mg by mouth every 6 (six) hours as needed for nausea (nausea).    Yes Historical Provider, MD   BP 111/70 mmHg  Pulse 62  Temp(Src) 97.9 F (36.6 C) (Oral)  Resp 12  Ht  (1.88 m)  Wt 167 lb 6.4 oz (75.932 kg)  BMI 21.48 kg/m2  SpO2 98% Physical Exam  Constitutional: He is oriented to person, place, and time. He appears well-developed and well-nourished.  HENT:  Head: Normocephalic and atraumatic.  Cardiovascular: Normal rate and regular rhythm.   No murmur heard. Pulmonary/Chest: Effort normal and breath sounds normal. No respiratory distress.  Abdominal: Soft. There is no tenderness. There is no  rebound and no guarding.  Musculoskeletal: He exhibits no edema or tenderness.   2+ radial pulses. No bony tenderness over the arm, chest wall.  Neurological: He is alert and oriented to person, place, and time.  Skin: Skin is warm and dry. No rash noted.  Psychiatric: He has a normal mood and affect. His behavior is normal.  Nursing note and vitals reviewed.   ED Course  Procedures (including critical care time) Labs Review Labs Reviewed  COMPREHENSIVE METABOLIC PANEL - Abnormal; Notable for the following:    Total Protein 8.4 (*)    ALT 15 (*)    Total Bilirubin 2.0 (*)    All other components within normal limits  CBC WITH DIFFERENTIAL/PLATELET - Abnormal; Notable for the following:    WBC 13.6 (*)    RBC 3.65  (*)    Hemoglobin 11.9 (*)    HCT 33.2 (*)    Neutro Abs 8.8 (*)    Monocytes Absolute 1.4 (*)    All other components within normal limits  RETICULOCYTES - Abnormal; Notable for the following:    RBC. 3.62 (*)    All other components within normal limits  RETICULOCYTES - Abnormal; Notable for the following:    RBC. 3.35 (*)    All other components within normal limits  TROPONIN I  LACTATE DEHYDROGENASE  MAGNESIUM    Imaging Review Dg Chest 2 View  05/25/2015  CLINICAL DATA:  Sickle cell crisis. Right shoulder and chest pain for the past 3 days. History of pneumonia and smoking. EXAM: CHEST  2 VIEW COMPARISON:  01/29/2015; 02/06/2014; 04/28/2012; chest CT - 01/30/2015 FINDINGS: Grossly unchanged cardiac silhouette and mediastinal contours. The lungs remain hyperexpanded. Right mid and bibasilar linear inches opacities are unchanged and favored to represent atelectasis or scar. No new focal airspace opacities. No pleural effusion or pneumothorax. No evidence of edema. No acute osseous abnormalities. IMPRESSION: Similar findings of lung hyper expansion and bilateral atelectasis/scar without acute cardiopulmonary disease. Electronically Signed   By: Simonne Come M.D.   On: 05/25/2015 11:46   I have personally reviewed and evaluated these images and lab results as part of my medical decision-making.   EKG Interpretation   Date/Time:  Saturday May 25 2015 10:49:55 EST Ventricular Rate:  74 PR Interval:  145 QRS Duration: 88 QT Interval:  393 QTC Calculation: 436 R Axis:   31 Text Interpretation:  Sinus rhythm Probable anteroseptal infarct, old  Minimal ST elevation, inferior leads Baseline wander in lead(s) V2 No  significant change since last tracing Confirmed by Lincoln Brigham [phone]) on  05/25/2015 10:52:16 AM      MDM   Final diagnoses:  Sickle cell pain crisis [facility])    Patient with history of sickle cell disease here for pain most typical for his pain crises. Labs are  stable compared to priors. There is no evidence of acute chest syndrome or acute infectious process at this time. Providing IV fluid hydration, pain control. On repeat evaluation following multiple doses of pain medications patient with ongoing pain. Dr. August Luz consulted for admission.    Tilden Fossa, MD 05/25/15 [phone]

## 2015-05-26 LAB — CBC WITH DIFFERENTIAL/PLATELET
Basophils Absolute: 0.1 10*3/uL (ref 0.0–0.1)
Basophils Relative: 1 %
EOS ABS: 0.8 10*3/uL — AB (ref 0.0–0.7)
EOS PCT: 7 %
HCT: 30.4 % — ABNORMAL LOW (ref 39.0–52.0)
HEMOGLOBIN: 10.9 g/dL — AB (ref 13.0–17.0)
LYMPHS ABS: 3.3 10*3/uL (ref 0.7–4.0)
LYMPHS PCT: 29 %
MCH: 33 pg (ref 26.0–34.0)
MCHC: 35.9 g/dL (ref 30.0–36.0)
MCV: 92.1 fL (ref 78.0–100.0)
MONOS PCT: 9 %
Monocytes Absolute: 1 10*3/uL (ref 0.1–1.0)
Neutro Abs: 6.3 10*3/uL (ref 1.7–7.7)
Neutrophils Relative %: 54 %
PLATELETS: 274 10*3/uL (ref 150–400)
RBC: 3.3 MIL/uL — AB (ref 4.22–5.81)
RDW: 14.5 % (ref 11.5–15.5)
WBC: 11.5 10*3/uL — AB (ref 4.0–10.5)

## 2015-05-26 LAB — COMPREHENSIVE METABOLIC PANEL
ALBUMIN: 4.2 g/dL (ref 3.5–5.0)
ALT: 13 U/L — AB (ref 17–63)
AST: 21 U/L (ref 15–41)
Alkaline Phosphatase: 68 U/L (ref 38–126)
Anion gap: 7 (ref 5–15)
BUN: 12 mg/dL (ref 6–20)
CHLORIDE: 105 mmol/L (ref 101–111)
CO2: 26 mmol/L (ref 22–32)
CREATININE: 0.72 mg/dL (ref 0.61–1.24)
Calcium: 8.7 mg/dL — ABNORMAL LOW (ref 8.9–10.3)
GFR calc Af Amer: >60 mL/min (ref >60)
GFR calc non Af Amer: >60 mL/min (ref >60)
Glucose, Bld: 90 mg/dL (ref 65–99)
POTASSIUM: 4.3 mmol/L (ref 3.5–5.1)
SODIUM: 138 mmol/L (ref 135–145)
Total Bilirubin: 1.9 mg/dL — ABNORMAL HIGH (ref 0.3–1.2)
Total Protein: 7.4 g/dL (ref 6.5–8.1)

## 2015-05-26 MED ORDER — KETOROLAC TROMETHAMINE 30 MG/ML IJ SOLN
30.0000 mg | Freq: Four times a day (QID) | INTRAMUSCULAR | Status: DC
  Administered 2015-05-26 – 2015-05-29 (×10): 30 mg via INTRAVENOUS
  Filled 2015-05-26 (×11): qty 1

## 2015-05-26 NOTE — Progress Notes (Signed)
Subjective: Patient is feeling a little better. Pain is down to 7 out of 10. He has used 25.6 mg of the Dilaudid with 55 demands and 51 deliveries. No shortness of breath, no cough no nausea vomiting no diarrhea. He has been able to walk around.  Objective: Vital signs in last 24 hours: Temp:  [97.6 F (36.4 C)-98.2 F (36.8 C)] 98.2 F (36.8 C) (01/29 1005) Pulse Rate:  [50-66] 65 (01/29 1005) Resp:  [10-18] 14 (01/29 1005) BP: (95-121)/(60-76) 121/72 mmHg (01/29 1005) SpO2:  [93 %-100 %] 100 % (01/29 1005) FiO2 (%):  [2 %] 2 % (01/28 1500) Weight:  [75.751 kg (167 lb)-75.932 kg (167 lb 6.4 oz)] 75.751 kg (167 lb) (01/29 2130) Weight change:  Last BM Date: 05/24/15  Intake/Output from previous day: 01/28 0701 - 01/29 0700 In: 480 [P.O.:480] Out: 1000 [Urine:1000] Intake/Output this shift: Total I/O In: 600 [P.O.:600] Out: 600 [Urine:600]  General appearance: alert, cooperative and no distress Head: Normocephalic, without obvious abnormality, atraumatic Eyes: conjunctivae/corneas clear. PERRL, EOM's intact. Fundi benign. Neck: no adenopathy, no carotid bruit, no JVD, supple, symmetrical, trachea midline and thyroid not enlarged, symmetric, no tenderness/mass/nodules Back: symmetric, no curvature. ROM normal. No CVA tenderness. Resp: clear to auscultation bilaterally Chest wall: no tenderness Cardio: regular rate and rhythm, S1, S2 normal, no murmur, click, rub or gallop GI: soft, non-tender; bowel sounds normal; no masses,  no organomegaly Extremities: extremities normal, atraumatic, no cyanosis or edema Pulses: 2+ and symmetric Skin: Skin color, texture, turgor normal. No rashes or lesions Neurologic: Grossly normal  Lab Results:  Recent Labs  05/25/15 1035 05/26/15 0557  WBC 13.6* 11.5*  HGB 11.9* 10.9*  HCT 33.2* 30.4*  PLT 299 274   BMET  Recent Labs  05/25/15 1035 05/26/15 0557  NA 138 138  K 4.8 4.3  CL 104 105  CO2 22 26  GLUCOSE 79 90  BUN 14 12   CREATININE 0.82 0.72  CALCIUM 9.5 8.7*    Studies/Results: Dg Chest 2 View  05/25/2015  CLINICAL DATA:  Sickle cell crisis. Right shoulder and chest pain for the past 3 days. History of pneumonia and smoking. EXAM: CHEST  2 VIEW COMPARISON:  01/29/2015; 02/06/2014; 04/28/2012; chest CT - 01/30/2015 FINDINGS: Grossly unchanged cardiac silhouette and mediastinal contours. The lungs remain hyperexpanded. Right mid and bibasilar linear inches opacities are unchanged and favored to represent atelectasis or scar. No new focal airspace opacities. No pleural effusion or pneumothorax. No evidence of edema. No acute osseous abnormalities. IMPRESSION: Similar findings of lung hyper expansion and bilateral atelectasis/scar without acute cardiopulmonary disease. Electronically Signed   By: Simonne Come M.D.   On: 05/25/2015 11:46    Medications: I have reviewed the patient's current medications.  Assessment/Plan: A 40 year old gentleman admitted with sickle cell painful crisis. #1 sickle cell painful crisis: Patient is doing better. I'll continue Dilaudid PCA Toradol and his MS Contin. We will reevaluate in the morning and depending on his response we will adjust accordingly. #2 sickle cell anemia: H&H is stable. Continue close monitoring #3 constipation: Patient has had a bowel movement. Continue laxatives #4 dehydration: Continue hydration. Monitor patient closely.  LOS: 1 day   Jolanta Cabeza,[name] 05/26/2015, 1:44 PM

## 2015-05-27 DIAGNOSIS — K5909 Other constipation: Secondary | ICD-10-CM

## 2015-05-27 LAB — COMPREHENSIVE METABOLIC PANEL
ALBUMIN: 4.4 g/dL (ref 3.5–5.0)
ALK PHOS: 67 U/L (ref 38–126)
ALT: 13 U/L — AB (ref 17–63)
AST: 21 U/L (ref 15–41)
Anion gap: 6 (ref 5–15)
BUN: 13 mg/dL (ref 6–20)
CALCIUM: 8.7 mg/dL — AB (ref 8.9–10.3)
CHLORIDE: 106 mmol/L (ref 101–111)
CO2: 27 mmol/L (ref 22–32)
CREATININE: 0.69 mg/dL (ref 0.61–1.24)
GFR calc Af Amer: >60 mL/min (ref >60)
GFR calc non Af Amer: >60 mL/min (ref >60)
GLUCOSE: 96 mg/dL (ref 65–99)
Potassium: 4.1 mmol/L (ref 3.5–5.1)
SODIUM: 139 mmol/L (ref 135–145)
Total Bilirubin: 2.1 mg/dL — ABNORMAL HIGH (ref 0.3–1.2)
Total Protein: 7.5 g/dL (ref 6.5–8.1)

## 2015-05-27 LAB — CBC WITH DIFFERENTIAL/PLATELET
BASOS ABS: 0 10*3/uL (ref 0.0–0.1)
Basophils Relative: 0 %
Eosinophils Absolute: 0.9 10*3/uL — ABNORMAL HIGH (ref 0.0–0.7)
Eosinophils Relative: 10 %
HCT: 28.4 % — ABNORMAL LOW (ref 39.0–52.0)
HEMOGLOBIN: 10.4 g/dL — AB (ref 13.0–17.0)
LYMPHS PCT: 28 %
Lymphs Abs: 2.6 10*3/uL (ref 0.7–4.0)
MCH: 33.4 pg (ref 26.0–34.0)
MCHC: 36.6 g/dL — ABNORMAL HIGH (ref 30.0–36.0)
MCV: 91.3 fL (ref 78.0–100.0)
MONOS PCT: 11 %
Monocytes Absolute: 1 10*3/uL (ref 0.1–1.0)
NEUTROS ABS: 4.8 10*3/uL (ref 1.7–7.7)
Neutrophils Relative %: 51 %
Platelets: 247 10*3/uL (ref 150–400)
RBC: 3.11 MIL/uL — AB (ref 4.22–5.81)
RDW: 14.8 % (ref 11.5–15.5)
WBC: 9.3 10*3/uL (ref 4.0–10.5)

## 2015-05-27 MED ORDER — HYDROMORPHONE HCL 1 MG/ML IJ SOLN
1.0000 mg | INTRAMUSCULAR | Status: DC | PRN
  Administered 2015-05-27 – 2015-05-28 (×5): 1 mg via INTRAVENOUS
  Filled 2015-05-27 (×5): qty 1

## 2015-05-27 MED ORDER — LACTULOSE 10 GM/15ML PO SOLN
20.0000 g | Freq: Every day | ORAL | Status: DC | PRN
  Administered 2015-05-27 – 2015-05-28 (×2): 20 g via ORAL
  Filled 2015-05-27 (×2): qty 30

## 2015-05-27 NOTE — Progress Notes (Signed)
SICKLE CELL SERVICE PROGRESS NOTE  [name] ZOX:[id number] DOB: April 15, 1976 DOA: 05/25/2015 PCP: Dorrene German, MD  Assessment/Plan: Principal Problem:   Sickle cell pain crisis (HCC) Active Problems:   Tobacco abuse   Anemia   Leukocytosis   Sickle cell disease with crisis (HCC)  1. Hb [place] with crisis: Pt reports pain at 7-8 and localized to shoulders and chest wall. He has used only 25.5 mg with 62/51: demands/deliveries in the last 24 hours. He reports that he does not feel much relief with each dose of PCA bolus. Will add clinician assisted doses on a PRN basis and continue PCA at current bolus dose. Re-assess this afternoon.  2. Leukocytosis: Resolved. 3. Anemia of chronic disease: Hb at baseline 4. Chronic pain: Continue MS Contin. 5. Constipation: Pt has not had a BM in 4 days. He is requesting Lactulose instead of Miralax.   Code Status: Full Code Family Communication: N/A Disposition Plan: Not yet ready for discharge  [name],[name]  Pager [phone]. If 7PM-7AM, please contact night-coverage.  05/27/2015, 11:36 AM  LOS: 2 days   Interim History: Pt reports pain at 7-8 and localized to shoulders and chest wall. He reports that he does not feel much relief with each dose of PCA bolus. Last BM 4 days ago.  Consultants:  None  Procedures:  None  Antibiotics:  None    Objective: Filed Vitals:   05/27/15 0400 05/27/15 0638 05/27/15 1032 05/27/15 1109  BP:  134/79 127/89   Pulse:  73 78   Temp:  98.5 F (36.9 C) 97.4 F (36.3 C)   TempSrc:  Oral Oral   Resp: Height:      Weight:      SpO2: 93% 95% 98% 95%   Weight change:   Intake/Output Summary (Last 24 hours) at 05/27/15 1136 Last data filed at 05/27/15 1000  Gross per 24 hour  Intake   1600 ml  Output   2600 ml  Net  -1000 ml    General: Alert, awake, oriented x3, in no acute distress.  HEENT: [place]/AT PEERL, EOMI, anicteric Neck: Trachea midline,  no masses, no  thyromegal,y no JVD, no carotid bruit OROPHARYNX:  Moist, No exudate/ erythema/lesions.  Heart: Regular rate and rhythm, without murmurs, rubs, gallops, PMI non-displaced, no heaves or thrills on palpation.  Lungs: Clear to auscultation, no wheezing or rhonchi noted. No increased vocal fremitus resonant to percussion  Abdomen: Soft, nontender, nondistended, positive bowel sounds, no masses no hepatosplenomegaly noted.  Neuro: No focal neurological deficits noted cranial nerves II through XII grossly intact.  Strength at functional baseline in bilateral upper and lower extremities. Musculoskeletal: No warmth swelling or erythema around joints, no spinal tenderness noted. Psychiatric: Patient alert and oriented x3, good insight and cognition, good recent to remote recall.    Data Reviewed: Basic Metabolic Panel:  Recent Labs Lab 05/25/15 1035 05/25/15 1540 05/26/15 0557 05/27/15 0513  NA 138  --  138 139  K 4.8  --  4.3 4.1  CL 104  --  105 106  CO2 22  --  26 27  GLUCOSE 79  --  90 96  BUN 14  --  12 13  CREATININE 0.82  --  0.72 0.69  CALCIUM 9.5  --  8.7* 8.7*  MG  --  2.1  --   --    Liver Function Tests:  Recent Labs Lab 05/25/15 1035 05/26/15 0557 05/27/15 0513  AST 35 21  21  ALT 15* 13* 13*  ALKPHOS 78 68 67  BILITOT 2.0* 1.9* 2.1*  PROT 8.4* 7.4 7.5  ALBUMIN 4.8 4.2 4.4   No results for input(s): LIPASE, AMYLASE in the last 168 hours. No results for input(s): AMMONIA in the last 168 hours. CBC:  Recent Labs Lab 05/25/15 1035 05/26/15 0557 05/27/15 0513  WBC 13.6* 11.5* 9.3  NEUTROABS 8.8* 6.3 4.8  HGB 11.9* 10.9* 10.4*  HCT 33.2* 30.4* 28.4*  MCV 91.0 92.1 91.3  PLT 299 274 247   Cardiac Enzymes:  Recent Labs Lab 05/25/15 1035  TROPONINI <0.03   BNP (last 3 results) No results for input(s): BNP in the last 8760 hours.  ProBNP (last 3 results) No results for input(s): PROBNP in the last 8760 hours.  CBG: No results for input(s): GLUCAP in  the last 168 hours.  No results found for this or any previous visit (from the past 240 hour(s)).   Studies: Dg Chest 2 View  05/25/2015  CLINICAL DATA:  Sickle cell crisis. Right shoulder and chest pain for the past 3 days. History of pneumonia and smoking. EXAM: CHEST  2 VIEW COMPARISON:  01/29/2015; 02/06/2014; 04/28/2012; chest CT - 01/30/2015 FINDINGS: Grossly unchanged cardiac silhouette and mediastinal contours. The lungs remain hyperexpanded. Right mid and bibasilar linear inches opacities are unchanged and favored to represent atelectasis or scar. No new focal airspace opacities. No pleural effusion or pneumothorax. No evidence of edema. No acute osseous abnormalities. IMPRESSION: Similar findings of lung hyper expansion and bilateral atelectasis/scar without acute cardiopulmonary disease. Electronically Signed   By: Simonne Come M.D.   On: 05/25/2015 11:46    Scheduled Meds: . enoxaparin (LOVENOX) injection  40 mg Subcutaneous Q24H  . folic acid  1 mg Oral Daily  . HYDROmorphone   Intravenous 6 times per day  . ketorolac  30 mg Intravenous 4 times per day  . morphine  60 mg Oral Q12H  . senna-docusate  1 tablet Oral BID   Continuous Infusions: . dextrose 5 % and 0.45% NaCl 125 mL/hr at 05/26/15 1815    Time Spent 30 minutes.

## 2015-05-28 MED ORDER — HYDROMORPHONE HCL 2 MG/ML IJ SOLN
2.0000 mg | INTRAMUSCULAR | Status: DC | PRN
  Administered 2015-05-28 – 2015-05-29 (×4): 2 mg via INTRAVENOUS
  Filled 2015-05-28 (×4): qty 1

## 2015-05-28 NOTE — Care Management Note (Signed)
Case Management Note  Patient Details  Name: [name] MRN: [id number] Date of Birth: 05/21/1975  Subjective/Objective:                 40 yo admitted with SCC   Action/Plan: From home with family  Expected Discharge Date:                  Expected Discharge Plan:  Home/Self Care  In-House Referral:     Discharge planning Services  CM Consult  Post Acute Care Choice:    Choice offered to:     DME Arranged:    DME Agency:     HH Arranged:    HH Agency:     Status of Service:  Completed, signed off  Medicare Important Message Given:  Yes Date Medicare IM Given:    Medicare IM give by:    Date Additional Medicare IM Given:    Additional Medicare Important Message give by:     If discussed at Long Length of Stay Meetings, dates discussed:    Additional Comments: Chart reviewed and no CM needs identified or communicated at this time. CM will continue to follow. Sandford Craze RN,BSN,NCM [phone] Bartholome Bill, RN 05/28/2015, 3:40 PM

## 2015-05-28 NOTE — Care Management Important Message (Signed)
Important Message  Patient Details  Name: [name] MRN: [id number] Date of Birth: Mar 11, 1976   Medicare Important Message Given:  Yes    Haskell Flirt 05/28/2015, 9:12 AMImportant Message  Patient Details  Name: [name] MRN: [id number] Date of Birth: March 01, 1976   Medicare Important Message Given:  Yes    Haskell Flirt 05/28/2015, 9:11 AM

## 2015-05-28 NOTE — Progress Notes (Signed)
SICKLE CELL SERVICE PROGRESS NOTE  GARTH DIFFLEY WUJ:[id number] DOB: [birth year] DOA: 05/25/2015 PCP: Dorrene German, MD  Assessment/Plan: Principal Problem:   Sickle cell pain crisis (HCC) Active Problems:   Tobacco abuse   Anemia   Leukocytosis   Sickle cell disease with crisis (HCC)  1. Hb [place] with crisis: Pt still  reporting pain at 7-8/10 and localized to shoulders and chest wall. He has used only 29.5 mg with 66/59: demands/deliveries in addition to 5 mg in clinician assisted doses in the last 24 hours.  Will continue clinician assisted doses on a PRN basis and continue PCA at current bolus dose. Re-assess for weaning tomorrow.  2. Leukocytosis: Resolved. 3. Anemia of chronic disease: Hb at baseline 4. Chronic pain: Continue MS Contin. 5. Constipation: Pt  had a BM yesterday.  Code Status: Full Code Family Communication: N/A (father at bedside). Disposition Plan: Not yet ready for discharge  Poetry Cerro A.  Pager [phone]. If 7PM-7AM, please contact night-coverage.  05/28/2015, 11:52 AM  LOS: 3 days   Interim History: Pt reports pain at 7-8 and localized to shoulders and chest wall. He reports that he does not feel much relief with each dose of PCA bolus. Last BM yesterday.  Consultants:  None  Procedures:  None  Antibiotics:  None    Objective: Filed Vitals:   05/28/15 0359 05/28/15 0537 05/28/15 0747 05/28/15 1014  BP:  122/62  124/72  Pulse:  62  71  Temp:  98.8 F (37.1 C)  97.6 F (36.4 C)  TempSrc:  Oral  Oral  Resp: Height:      Weight:      SpO2: 96% 98% 96% 94%   Weight change:   Intake/Output Summary (Last 24 hours) at 05/28/15 1152 Last data filed at 05/28/15 0600  Gross per 24 hour  Intake 7693.75 ml  Output    950 ml  Net 6743.75 ml    General: Alert, awake, oriented x3, in no acute distress. Pt up ambulating in halls without difficulty.  HEENT: [place]/AT PEERL, EOMI, anicteric Neck: Trachea midline,  no masses, no  thyromegal,y no JVD, no carotid bruit OROPHARYNX:  Moist, No exudate/ erythema/lesions.  Heart: Regular rate and rhythm, without murmurs, rubs, gallops, PMI non-displaced, no heaves or thrills on palpation.  Lungs: Clear to auscultation, no wheezing or rhonchi noted. No increased vocal fremitus resonant to percussion  Abdomen: Soft, nontender, nondistended, positive bowel sounds, no masses no hepatosplenomegaly noted.  Neuro: No focal neurological deficits noted cranial nerves II through XII grossly intact.  Strength at functional baseline in bilateral upper and lower extremities. Musculoskeletal: No warmth swelling or erythema around joints, no spinal tenderness noted. Psychiatric: Patient alert and oriented x3, good insight and cognition, good recent to remote recall.    Data Reviewed: Basic Metabolic Panel:  Recent Labs Lab 05/25/15 1035 05/25/15 1540 05/26/15 0557 05/27/15 0513  NA 138  --  138 139  K 4.8  --  4.3 4.1  CL 104  --  105 106  CO2 22  --  26 27  GLUCOSE 79  --  90 96  BUN 14  --  12 13  CREATININE 0.82  --  0.72 0.69  CALCIUM 9.5  --  8.7* 8.7*  MG  --  2.1  --   --    Liver Function Tests:  Recent Labs Lab 05/25/15 1035 05/26/15 0557 05/27/15 0513  AST 35 21 21  ALT 15* 13* 13*  ALKPHOS 78 68 67  BILITOT 2.0* 1.9* 2.1*  PROT 8.4* 7.4 7.5  ALBUMIN 4.8 4.2 4.4   No results for input(s): LIPASE, AMYLASE in the last 168 hours. No results for input(s): AMMONIA in the last 168 hours. CBC:  Recent Labs Lab 05/25/15 1035 05/26/15 0557 05/27/15 0513  WBC 13.6* 11.5* 9.3  NEUTROABS 8.8* 6.3 4.8  HGB 11.9* 10.9* 10.4*  HCT 33.2* 30.4* 28.4*  MCV 91.0 92.1 91.3  PLT 299 274 247   Cardiac Enzymes:  Recent Labs Lab 05/25/15 1035  TROPONINI <0.03   BNP (last 3 results) No results for input(s): BNP in the last 8760 hours.  ProBNP (last 3 results) No results for input(s): PROBNP in the last 8760 hours.  CBG: No results for input(s): GLUCAP in  the last 168 hours.  No results found for this or any previous visit (from the past 240 hour(s)).   Studies: Dg Chest 2 View  05/25/2015  CLINICAL DATA:  Sickle cell crisis. Right shoulder and chest pain for the past 3 days. History of pneumonia and smoking. EXAM: CHEST  2 VIEW COMPARISON:  01/29/2015; 02/06/2014; 04/28/2012; chest CT - 01/30/2015 FINDINGS: Grossly unchanged cardiac silhouette and mediastinal contours. The lungs remain hyperexpanded. Right mid and bibasilar linear inches opacities are unchanged and favored to represent atelectasis or scar. No new focal airspace opacities. No pleural effusion or pneumothorax. No evidence of edema. No acute osseous abnormalities. IMPRESSION: Similar findings of lung hyper expansion and bilateral atelectasis/scar without acute cardiopulmonary disease. Electronically Signed   By: Simonne Come M.D.   On: 05/25/2015 11:46    Scheduled Meds: . enoxaparin (LOVENOX) injection  40 mg Subcutaneous Q24H  . folic acid  1 mg Oral Daily  . HYDROmorphone   Intravenous 6 times per day  . ketorolac  30 mg Intravenous 4 times per day  . morphine  60 mg Oral Q12H  . senna-docusate  1 tablet Oral BID   Continuous Infusions: . dextrose 5 % and 0.45% NaCl 125 mL/hr at 05/28/15 0402    Time Spent 25 minutes.

## 2015-05-29 DIAGNOSIS — Z720 Tobacco use: Secondary | ICD-10-CM

## 2015-05-29 LAB — CBC WITH DIFFERENTIAL/PLATELET
BASOS ABS: 0 10*3/uL (ref 0.0–0.1)
Basophils Relative: 0 %
EOS ABS: 1.2 10*3/uL — AB (ref 0.0–0.7)
Eosinophils Relative: 11 %
HCT: 25.9 % — ABNORMAL LOW (ref 39.0–52.0)
HEMOGLOBIN: 9.4 g/dL — AB (ref 13.0–17.0)
LYMPHS ABS: 2.9 10*3/uL (ref 0.7–4.0)
LYMPHS PCT: 28 %
MCH: 33.3 pg (ref 26.0–34.0)
MCHC: 36.3 g/dL — ABNORMAL HIGH (ref 30.0–36.0)
MCV: 91.8 fL (ref 78.0–100.0)
Monocytes Absolute: 1 10*3/uL (ref 0.1–1.0)
Monocytes Relative: 9 %
NEUTROS PCT: 52 %
Neutro Abs: 5.3 10*3/uL (ref 1.7–7.7)
PLATELETS: 241 10*3/uL (ref 150–400)
RBC: 2.82 MIL/uL — AB (ref 4.22–5.81)
RDW: 14.8 % (ref 11.5–15.5)
WBC: 10.3 10*3/uL (ref 4.0–10.5)

## 2015-05-29 MED ORDER — MORPHINE SULFATE 30 MG PO TABS
30.0000 mg | ORAL_TABLET | ORAL | Status: DC
  Administered 2015-05-29: 30 mg via ORAL
  Filled 2015-05-29: qty 1

## 2015-05-29 NOTE — Discharge Summary (Signed)
[name] MRN: [id number] DOB/AGE: 40/04/1975 40 y.o.  Admit date: 05/25/2015 Discharge date: 05/29/2015  Primary Care Physician:  Dorrene German, MD   Discharge Diagnoses:   Patient Active Problem List   Diagnosis Date Noted  . Sickle cell disease with crisis (HCC) 05/25/2015  . Chest pain 02/08/2015  . CAP (community acquired pneumonia) 02/08/2015  . Sickle cell crisis acute chest syndrome (HCC) 02/08/2015  . HCAP (healthcare-associated pneumonia) 02/08/2015  . Shoulder pain, right 02/08/2015  . Numbness and tingling in right hand   . Hemoglobin [place] with crisis (HCC) 10/13/2014  . Sickle cell anemia with pain (HCC) 05/31/2014  . Sickle cell crisis (HCC) 10/24/2013  . Sickle cell disease, type [place] (HCC) 10/18/2012  . Sickle cell pain crisis (HCC) 10/13/2012  . History of tobacco abuse 10/13/2012  . Hypokalemia 05/02/2012  . Leukocytosis 05/02/2012  . Anemia 11/08/2011  . Avascular necrosis of hip, left (HCC) 08/27/2011  . Elevated brain natriuretic peptide (BNP) level 08/25/2011  . Acute chest syndrome (HCC) 05/15/2011  . Tobacco abuse 05/15/2011  . Constipation 05/14/2011  . Sickle cell anemia with crisis (HCC) 05/13/2011    DISCHARGE MEDICATION:   Medication List    TAKE these medications        folic acid 1 MG tablet  Commonly known as:  FOLVITE  Take 1 mg by mouth daily.     ibuprofen 800 MG tablet  Commonly known as:  ADVIL,MOTRIN  TAKE 1 TABLET BY MOUTH THREE TIMES DAILY AS NEEDED FOR PAIN     morphine 30 MG tablet  Commonly known as:  MSIR  Take 30 mg by mouth 2 (two) times daily as needed for severe pain (pain).     morphine 60 MG 12 hr tablet  Commonly known as:  MS CONTIN  Take 1 tablet (60 mg total) by mouth 2 (two) times daily.     promethazine 25 MG tablet  Commonly known as:  PHENERGAN  Take 25 mg by mouth every 6 (six) hours as needed for nausea (nausea).          Consults:     SIGNIFICANT DIAGNOSTIC STUDIES:  Dg Chest 2  View  05/25/2015  CLINICAL DATA:  Sickle cell crisis. Right shoulder and chest pain for the past 3 days. History of pneumonia and smoking. EXAM: CHEST  2 VIEW COMPARISON:  01/29/2015; 02/06/2014; 04/28/2012; chest CT - 01/30/2015 FINDINGS: Grossly unchanged cardiac silhouette and mediastinal contours. The lungs remain hyperexpanded. Right mid and bibasilar linear inches opacities are unchanged and favored to represent atelectasis or scar. No new focal airspace opacities. No pleural effusion or pneumothorax. No evidence of edema. No acute osseous abnormalities. IMPRESSION: Similar findings of lung hyper expansion and bilateral atelectasis/scar without acute cardiopulmonary disease. Electronically Signed   By: Simonne Come M.D.   On: 05/25/2015 11:46      No results found for this or any previous visit (from the past 240 hour(s)).  BRIEF ADMITTING H & P: This is a 40 year old gentleman with history of sickle cell disease presenting to the ER with 2 days of pain in his back and legs. Pain is rated as 10 out of 10 worsened with movement and not relieved by anything including his home medications. He has taken the medicine the last 2 days but pain is still persistent. He denied any shortness of breath no cough no nausea vomiting or diarrhea. Patient has been seen in the ER and initial care included IV Dilaudid up to 6 mg  with no relief. He is therefore being admitted to the hospital for management of his sickle cell crisis.   Hospital Course:  Present on Admission:  . Sickle cell pain crisis (HCC): Pt presented in sickle cell pain crisis. He was managed with IV Dilaudid via PCA, Toradol and IVF. As the pain improved he was transitioned to oral analgesics. At the time of discharge his pain was at at level of 4/10 on oral analgesics. He is discharged home in good condition and will follow up with Dr.  Concepcion Elk for ongoing care. . Leukocytosis: Felt to be secondary to crisis. Resolved with resolution of crisis.   . Tobacco use disorder: Pt counseled against further tobacco use. In pre-contemplative state. . Anemia of chronic disease: Hb stable throughout hospitalization . Constipation: Pt was treated with lactulose with good results.  Disposition and Follow-up:  Pt discharged home in good condition and will follow up with Dr. Concepcion Elk as needed.     Discharge Instructions    Activity as tolerated - No restrictions    Complete by:  As directed      Diet general    Complete by:  As directed            DISCHARGE EXAM:  General: Alert, awake, oriented x3, in NAD. Well appearing.  Vital Signs: BP 126/83,HR 64, T 97.9 F (36.6 C), temperature source Oral, RR 12, height 6\' 2"  (1.88 m), weight 175 lb 11.3 oz (79.7 kg), SpO2 96 % on RA. HEENT: [place]/AT PEERL, EOMI, anicteric. Neck: Trachea midline, no masses, no thyromegal,y no JVD, no carotid bruit OROPHARYNX: Moist, No exudate/ erythema/lesions.  Heart: Regular rate and rhythm, without murmurs, rubs, gallops or S3. PMI non-displaced. Exam reveals no decreased pulses. Pulmonary/Chest: Normal effort. Breath sounds normal. No. Apnea. Clear to auscultation,no stridor,  no wheezing and no rhonchi noted. No respiratory distress and no tenderness noted. Abdomen: Soft, nontender, nondistended, normal bowel sounds, no masses no hepatosplenomegaly noted. No fluid wave and no ascites. There is no guarding or rebound. Neuro: Alert and oriented to person, place and time. Normal motor skills, Displays no atrophy or tremors and exhibits normal muscle tone.  No focal neurological deficits noted cranial nerves II through XII grossly intact. No sensory deficit noted. Strength at baseline in bilateral upper and lower extremities. Gait normal. Musculoskeletal: No warm swelling or erythema around joints, no spinal tenderness noted. Psychiatric: Patient alert and oriented x3, good insight and cognition, good recent to remote recall. Lymph node survey: No cervical axillary  or inguinal lymphadenopathy noted. Skin: Skin is warm and dry. No bruising, no ecchymosis and no rash noted. Pt is not diaphoretic. No erythema. No pallor Genitalia: External genitalia normal. Vaginal vault shows healthy tissue on visual inspection. No foul smelling discharge. Pt exhibits good lubrication with physiological discharge. No adnexal tenderness noted however there is a soft tissue mass felt on the anterior left vaginal vault.  Psychiatric: Mood, memory, affect and judgement normal     Recent Labs  05/27/15 0513  NA 139  K 4.1  CL 106  CO2 27  GLUCOSE 96  BUN 13  CREATININE 0.69  CALCIUM 8.7*    Recent Labs  05/27/15 0513  AST 21  ALT 13*  ALKPHOS 67  BILITOT 2.1*  PROT 7.5  ALBUMIN 4.4   No results for input(s): LIPASE, AMYLASE in the last 72 hours.  Recent Labs  05/27/15 0513 05/29/15 0412  WBC 9.3 10.3  NEUTROABS 4.8 5.3  HGB 10.4* 9.4*  HCT  28.4* 25.9*  MCV 91.3 91.8  PLT 247 241     Total time spent including face to face and decision making was greater than 30 minutes  Signed: [name],[name] 05/29/2015, 3:27 PM

## 2015-07-14 ENCOUNTER — Inpatient Hospital Stay (HOSPITAL_COMMUNITY)
Admission: EM | Admit: 2015-07-14 | Discharge: 2015-07-19 | DRG: 812 | Disposition: A | Discharge status: Home / Self Care | Payer: Medicare Other | Attending: Internal Medicine | Admitting: Internal Medicine

## 2015-07-14 ENCOUNTER — Emergency Department (HOSPITAL_COMMUNITY): Payer: Medicare Other

## 2015-07-14 ENCOUNTER — Encounter (HOSPITAL_COMMUNITY): Payer: Self-pay | Admitting: Emergency Medicine

## 2015-07-14 DIAGNOSIS — D5700 Hb-SS disease with crisis, unspecified: Secondary | ICD-10-CM | POA: Diagnosis present

## 2015-07-14 DIAGNOSIS — D57219 Sickle-cell/Hb-C disease with crisis, unspecified: Principal | ICD-10-CM | POA: Diagnosis present

## 2015-07-14 DIAGNOSIS — Z720 Tobacco use: Secondary | ICD-10-CM | POA: Diagnosis present

## 2015-07-14 DIAGNOSIS — F17210 Nicotine dependence, cigarettes, uncomplicated: Secondary | ICD-10-CM | POA: Diagnosis present

## 2015-07-14 DIAGNOSIS — D72829 Elevated white blood cell count, unspecified: Secondary | ICD-10-CM | POA: Diagnosis present

## 2015-07-14 DIAGNOSIS — K5900 Constipation, unspecified: Secondary | ICD-10-CM | POA: Diagnosis present

## 2015-07-14 DIAGNOSIS — K5909 Other constipation: Secondary | ICD-10-CM | POA: Diagnosis not present

## 2015-07-14 DIAGNOSIS — E860 Dehydration: Secondary | ICD-10-CM | POA: Diagnosis present

## 2015-07-14 DIAGNOSIS — D638 Anemia in other chronic diseases classified elsewhere: Secondary | ICD-10-CM | POA: Diagnosis present

## 2015-07-14 LAB — CBC WITH DIFFERENTIAL/PLATELET
BASOS ABS: 0.1 10*3/uL (ref 0.0–0.1)
Basophils Relative: 1 %
Eosinophils Absolute: 1 10*3/uL — ABNORMAL HIGH (ref 0.0–0.7)
Eosinophils Relative: 7 %
HCT: 30.1 % — ABNORMAL LOW (ref 39.0–52.0)
HEMOGLOBIN: 10.8 g/dL — AB (ref 13.0–17.0)
LYMPHS ABS: 3.6 10*3/uL (ref 0.7–4.0)
Lymphocytes Relative: 26 %
MCH: 33.5 pg (ref 26.0–34.0)
MCHC: 35.9 g/dL (ref 30.0–36.0)
MCV: 93.5 fL (ref 78.0–100.0)
MONO ABS: 1.3 10*3/uL — AB (ref 0.1–1.0)
MONOS PCT: 9 %
NEUTROS ABS: 8 10*3/uL — AB (ref 1.7–7.7)
Neutrophils Relative %: 57 %
Platelets: 333 10*3/uL (ref 150–400)
RBC: 3.22 MIL/uL — ABNORMAL LOW (ref 4.22–5.81)
RDW: 13.9 % (ref 11.5–15.5)
WBC: 14 10*3/uL — ABNORMAL HIGH (ref 4.0–10.5)

## 2015-07-14 LAB — COMPREHENSIVE METABOLIC PANEL
ALBUMIN: 4.6 g/dL (ref 3.5–5.0)
ALK PHOS: 75 U/L (ref 38–126)
ALT: 15 U/L — AB (ref 17–63)
AST: 21 U/L (ref 15–41)
Anion gap: 9 (ref 5–15)
BILIRUBIN TOTAL: 2.7 mg/dL — AB (ref 0.3–1.2)
BUN: 16 mg/dL (ref 6–20)
CALCIUM: 9.1 mg/dL (ref 8.9–10.3)
CO2: 26 mmol/L (ref 22–32)
CREATININE: 0.77 mg/dL (ref 0.61–1.24)
Chloride: 107 mmol/L (ref 101–111)
GFR calc Af Amer: >60 mL/min (ref >60)
GFR calc non Af Amer: >60 mL/min (ref >60)
GLUCOSE: 86 mg/dL (ref 65–99)
POTASSIUM: 3.5 mmol/L (ref 3.5–5.1)
Sodium: 142 mmol/L (ref 135–145)
TOTAL PROTEIN: 7.8 g/dL (ref 6.5–8.1)

## 2015-07-14 LAB — RETICULOCYTES
RBC.: 3.19 MIL/uL — ABNORMAL LOW (ref 4.22–5.81)
RETIC CT PCT: 2.3 % (ref 0.4–3.1)
Retic Count, Absolute: 73.4 10*3/uL (ref 19.0–186.0)

## 2015-07-14 LAB — LACTATE DEHYDROGENASE: LDH: 221 U/L — ABNORMAL HIGH (ref 98–192)

## 2015-07-14 LAB — MAGNESIUM: MAGNESIUM: 2.1 mg/dL (ref 1.7–2.4)

## 2015-07-14 LAB — I-STAT TROPONIN, ED: Troponin i, poc: 0.01 ng/mL (ref 0.00–0.08)

## 2015-07-14 MED ORDER — HYDROMORPHONE HCL 2 MG/ML IJ SOLN
2.0000 mg | INTRAMUSCULAR | Status: DC | PRN
  Administered 2015-07-14 (×4): 2 mg via INTRAVENOUS
  Filled 2015-07-14 (×5): qty 1

## 2015-07-14 MED ORDER — FOLIC ACID 1 MG PO TABS
1.0000 mg | ORAL_TABLET | Freq: Every day | ORAL | Status: DC
  Administered 2015-07-14 – 2015-07-19 (×6): 1 mg via ORAL
  Filled 2015-07-14 (×6): qty 1

## 2015-07-14 MED ORDER — DIPHENHYDRAMINE HCL 12.5 MG/5ML PO ELIX: 12.5000 mg | ORAL_SOLUTION | Freq: Four times a day (QID) | ORAL | Status: DC | PRN

## 2015-07-14 MED ORDER — ENOXAPARIN SODIUM 30 MG/0.3ML ~~LOC~~ SOLN
30.0000 mg | SUBCUTANEOUS | Status: DC
  Administered 2015-07-14 – 2015-07-18 (×5): 30 mg via SUBCUTANEOUS
  Filled 2015-07-14 (×6): qty 0.3

## 2015-07-14 MED ORDER — SENNOSIDES-DOCUSATE SODIUM 8.6-50 MG PO TABS
1.0000 | ORAL_TABLET | Freq: Two times a day (BID) | ORAL | Status: DC
  Administered 2015-07-14 – 2015-07-19 (×10): 1 via ORAL
  Filled 2015-07-14 (×10): qty 1

## 2015-07-14 MED ORDER — DIPHENHYDRAMINE HCL 50 MG/ML IJ SOLN
25.0000 mg | Freq: Once | INTRAMUSCULAR | Status: AC
  Administered 2015-07-14: 25 mg via INTRAVENOUS
  Filled 2015-07-14: qty 1

## 2015-07-14 MED ORDER — POLYETHYLENE GLYCOL 3350 17 G PO PACK
17.0000 g | PACK | Freq: Every day | ORAL | Status: DC | PRN
  Administered 2015-07-17 – 2015-07-18 (×2): 17 g via ORAL
  Filled 2015-07-14 (×2): qty 1

## 2015-07-14 MED ORDER — HYDROMORPHONE 1 MG/ML IV SOLN
INTRAVENOUS | Status: DC
  Administered 2015-07-14: 7 mg via INTRAVENOUS
  Administered 2015-07-14: 17:00:00 via INTRAVENOUS
  Administered 2015-07-15: 4.5 mg via INTRAVENOUS
  Administered 2015-07-15: 6.5 mg via INTRAVENOUS
  Administered 2015-07-15: 3 mg via INTRAVENOUS
  Administered 2015-07-15: 7.5 mg via INTRAVENOUS
  Administered 2015-07-15: 10:00:00 via INTRAVENOUS
  Administered 2015-07-15: 5.5 mg via INTRAVENOUS
  Administered 2015-07-15: 4.5 mg via INTRAVENOUS
  Administered 2015-07-16: 03:00:00 via INTRAVENOUS
  Administered 2015-07-16: 5.29 mg via INTRAVENOUS
  Administered 2015-07-16: 7 mg via INTRAVENOUS
  Administered 2015-07-16: 6 mg via INTRAVENOUS
  Administered 2015-07-16: 20:00:00 via INTRAVENOUS
  Administered 2015-07-16: 5.2 mg via INTRAVENOUS
  Administered 2015-07-16: 7.5 mg via INTRAVENOUS
  Administered 2015-07-16: 6 mg via INTRAVENOUS
  Administered 2015-07-17: 7 mg via INTRAVENOUS
  Administered 2015-07-17: 2.5 mg via INTRAVENOUS
  Administered 2015-07-17: 4 mg via INTRAVENOUS
  Administered 2015-07-17: 6 mg via INTRAVENOUS
  Administered 2015-07-17: 4.5 mg via INTRAVENOUS
  Administered 2015-07-17: 5 mg via INTRAVENOUS
  Administered 2015-07-17: 25 mg via INTRAVENOUS
  Administered 2015-07-18: 4.5 mg via INTRAVENOUS
  Administered 2015-07-18: 5.4 mg via INTRAVENOUS
  Administered 2015-07-18: 7 mg via INTRAVENOUS
  Administered 2015-07-18: 2.5 mg via INTRAVENOUS
  Administered 2015-07-18: 6 mg via INTRAVENOUS
  Administered 2015-07-18: 11:00:00 via INTRAVENOUS
  Administered 2015-07-18: 2.5 mg via INTRAVENOUS
  Administered 2015-07-19: 3.5 mg via INTRAVENOUS
  Administered 2015-07-19 (×2): 1 mg via INTRAVENOUS
  Administered 2015-07-19: 3 mg via INTRAVENOUS
  Filled 2015-07-14 (×6): qty 25

## 2015-07-14 MED ORDER — SODIUM CHLORIDE 0.9% FLUSH: 9.0000 mL | INTRAVENOUS | Status: DC | PRN

## 2015-07-14 MED ORDER — SODIUM CHLORIDE 0.45 % IV SOLN
INTRAVENOUS | Status: DC
  Administered 2015-07-14: 10:00:00 via INTRAVENOUS

## 2015-07-14 MED ORDER — KETOROLAC TROMETHAMINE 30 MG/ML IJ SOLN
30.0000 mg | Freq: Once | INTRAMUSCULAR | Status: AC
  Administered 2015-07-14: 30 mg via INTRAVENOUS
  Filled 2015-07-14: qty 1

## 2015-07-14 MED ORDER — NALOXONE HCL 0.4 MG/ML IJ SOLN: 0.4000 mg | INTRAMUSCULAR | Status: DC | PRN

## 2015-07-14 MED ORDER — ONDANSETRON HCL 4 MG/2ML IJ SOLN
4.0000 mg | INTRAMUSCULAR | Status: DC | PRN
  Administered 2015-07-14 – 2015-07-18 (×15): 4 mg via INTRAVENOUS
  Filled 2015-07-14 (×15): qty 2

## 2015-07-14 MED ORDER — MORPHINE SULFATE 30 MG PO TABS
30.0000 mg | ORAL_TABLET | Freq: Two times a day (BID) | ORAL | Status: DC | PRN
  Administered 2015-07-16: 30 mg via ORAL
  Filled 2015-07-14: qty 1

## 2015-07-14 MED ORDER — DEXTROSE-NACL 5-0.45 % IV SOLN
INTRAVENOUS | Status: DC
  Administered 2015-07-14 – 2015-07-18 (×8): via INTRAVENOUS

## 2015-07-14 MED ORDER — MORPHINE SULFATE ER 30 MG PO TBCR
60.0000 mg | EXTENDED_RELEASE_TABLET | Freq: Two times a day (BID) | ORAL | Status: DC
  Administered 2015-07-14 – 2015-07-19 (×10): 60 mg via ORAL
  Filled 2015-07-14 (×8): qty 2
  Filled 2015-07-14: qty 4
  Filled 2015-07-14 (×2): qty 2

## 2015-07-14 MED ORDER — SODIUM CHLORIDE 0.9 % IV SOLN
12.5000 mg | Freq: Four times a day (QID) | INTRAVENOUS | Status: DC | PRN
  Administered 2015-07-15 – 2015-07-17 (×5): 12.5 mg via INTRAVENOUS
  Filled 2015-07-14 (×11): qty 0.25

## 2015-07-14 NOTE — H&P (Signed)
[name] is an 41 y.o. male.   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 Pain is worse with movement and not relieved by anything. It feels like his typical sickle cell crisis. Patient was seen in the ER and has received 6 mg of Dilaudid with minimal relief. He has no shortness of breath or cough no nausea vomiting or diarrhea. Patient said he took his home medications and he is not out of it. He appears dehydrated and a mild distress hence is being admitted to the hospital for further treatment.  Past Medical History  Diagnosis Date  . Sickle cell crisis ([place])   . Blood transfusion   . 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 Infection of bone, shoulder region ([place])     left shoulder  . Pneumonia   . Avascular necrosis of hip, left ([place]) 08/27/2011    Past Surgical History  Procedure Laterality Date  . Orif right hip fracture  1995  . Joint replacement  2006    right total hip arthroplasty  . Bone graft hip iliac crest      Family History  Problem Relation Age of Onset  . Adopted: Yes   Social History:  reports that he has been smoking Cigarettes.  He has been smoking about 0.50 packs per day. He has never used smokeless tobacco. He reports that he does not drink alcohol or use illicit drugs.  Allergies: No Known Allergies   (Not in a hospital admission)  Results for orders placed or performed during the hospital encounter of 07/14/15 (from the past 48 hour(s))  Comprehensive metabolic panel     Status: Abnormal   Collection Time: 07/14/15  9:28 AM  Result Value Ref Range   Sodium 142 135 - 145 mmol/L   Potassium 3.5 3.5 - 5.1 mmol/L   Chloride 107 101 - 111 mmol/L   CO2 26 22 - 32 mmol/L   Glucose, Bld 86 65 -  99 mg/dL   BUN 16 6 - 20 mg/dL   Creatinine, Ser 0.77 0.61 - 1.24 mg/dL   Calcium 9.1 8.9 - 10.3 mg/dL   Total Protein 7.8 6.5 - 8.1 g/dL   Albumin 4.6 3.5 - 5.0 g/dL   AST 21 15 - 41 U/L   ALT 15 (L) 17 - 63 U/L   Alkaline Phosphatase 75 38 - 126 U/L   Total Bilirubin 2.7 (H) 0.3 - 1.2 mg/dL   GFR calc non Af Amer >60 >60 mL/min   GFR calc Af Amer >60 >60 mL/min    Comment: (NOTE) The eGFR has been calculated using the CKD EPI equation. This calculation has not been validated in all clinical situations. eGFR's persistently <60 mL/min signify possible Chronic Kidney Disease.    Anion gap 9 5 - 15  CBC with Differential     Status: Abnormal   Collection Time: 07/14/15  9:28 AM  Result Value Ref Range   WBC 14.0 (H) 4.0 - 10.5 K/uL   RBC 3.22 (L) 4.22 - 5.81 MIL/uL   Hemoglobin 10.8 (L) 13.0 - 17.0 g/dL   HCT 30.1 (L) 39.0 - 52.0 %   MCV 93.5 78.0 - 100.0 fL   MCH  33.5 26.0 - 34.0 pg   MCHC 35.9 30.0 - 36.0 g/dL   RDW 13.9 11.5 - 15.5 %   Platelets 333 150 - 400 K/uL   Neutrophils Relative % 57 %   Lymphocytes Relative 26 %   Monocytes Relative 9 %   Eosinophils Relative 7 %   Basophils Relative 1 %   Neutro Abs 8.0 (H) 1.7 - 7.7 K/uL   Lymphs Abs 3.6 0.7 - 4.0 K/uL   Monocytes Absolute 1.3 (H) 0.1 - 1.0 K/uL   Eosinophils Absolute 1.0 (H) 0.0 - 0.7 K/uL   Basophils Absolute 0.1 0.0 - 0.1 K/uL   RBC Morphology TARGET CELLS     Comment: SICKLE CELLS   Smear Review LARGE PLATELETS PRESENT   Reticulocytes     Status: Abnormal   Collection Time: 07/14/15  9:28 AM  Result Value Ref Range   Retic Ct Pct 2.3 0.4 - 3.1 %   RBC. 3.19 (L) 4.22 - 5.81 MIL/uL   Retic Count, Manual 73.4 19.0 - 186.0 K/uL  I-stat troponin, ED     Status: None   Collection Time: 07/14/15  9:38 AM  Result Value Ref Range   Troponin i, poc 0.01 0.00 - 0.08 ng/mL   Comment 3            Comment: Due to the release kinetics of cTnI, a negative result within the first hours of the onset of symptoms  does not rule out myocardial infarction with certainty. If myocardial infarction is still suspected, repeat the test at appropriate intervals.    Dg Chest 2 View  07/14/2015  CLINICAL DATA:  Sickle cell pain crisis; SOB and generalized body pain today; hx PNA; smoker EXAM: CHEST  2 VIEW COMPARISON:  05/25/2015 FINDINGS: Heart size is normal. Emphysematous changes are present. Chronic bronchitic changes are noted. There are no focal consolidations or pleural effusions. No pulmonary edema. IMPRESSION: 1. Chronic emphysematous and bronchitic changes. 2.  No evidence for acute  abnormality. Electronically Signed   By: Nolon Nations M.D.   On: 07/14/2015 10:29    Review of Systems  Eyes: Negative.   Respiratory: Positive for shortness of breath. Negative for cough and hemoptysis.   Cardiovascular: Positive for chest pain.  Gastrointestinal: Negative.   Genitourinary: Negative.   Musculoskeletal: Positive for myalgias, back pain and joint pain.  Skin: Negative.   Neurological: Positive for weakness. Negative for dizziness.  Endo/Heme/Allergies: Negative.   Psychiatric/Behavioral: Negative.     Blood pressure 94/62, pulse 56, temperature 98.4 F (36.9 C), temperature source Oral, resp. rate 13, weight 77.111 kg (170 lb), SpO2 98 %. Physical Exam  Constitutional: He is oriented to person, place, and time. He appears well-developed and well-nourished.  HENT:  Head: Normocephalic and atraumatic.  Right Ear: External ear normal.  Left Ear: External ear normal.  Eyes: Conjunctivae are normal. Pupils are equal, round, and reactive to light.  Neck: Normal range of motion. Neck supple.  Cardiovascular: Normal rate, regular rhythm and normal heart sounds.   Respiratory: Effort normal and breath sounds normal.  GI: Soft. Bowel sounds are normal.  Musculoskeletal: Normal range of motion. He exhibits no tenderness.  Neurological: He is alert and oriented to person, place, and time. No cranial  nerve deficit. Coordination normal.  Skin: Skin is warm and dry.  Psychiatric: He has a normal mood and affect.     Assessment/Plan A 40 year old gentleman with known history of sickle cell disease admitted with sickle cell painful crisis.  #  1 sickle cell painful crisis: Patient will be admitted to the hospital. He'll be placed on Toradol, IV Dilaudid PCA with setting for opiate tolerant person and also hydrate him. We'll do pain assessment daily and adjust medicines as needed.  #2 sickle cell anemia: His hemoglobin seems to be at baseline. We'll continue to monitor.  #3 dehydration: Patient will be aggressively hydrated.  #4 tobacco abuse: Counseling has been provided. Nicotine patch will be offered to patient if needed.  #5 leukocytosis: Due to sickle cell crisis. Continue to monitor.  Barbette Merino, MD 07/14/2015, 1:11 PM

## 2015-07-14 NOTE — ED Notes (Signed)
Patient transported to X-ray 

## 2015-07-14 NOTE — ED Provider Notes (Signed)
CSN: [id number]     Arrival date & time 07/14/15  0847 History   First MD Initiated Contact with Patient 07/14/15 0912     Chief Complaint  Patient presents with  . Sickle Cell Pain Crisis     (Consider location/radiation/quality/duration/timing/severity/associated sxs/prior Treatment) HPI   Patient is a 40 year old male with past medical history of sickle cell disease and avascular necrosis of bilateral hips who presents the ED with complaint of sickle cell pain crisis. Patient reports having constant right-sided rib pain started 3 days ago, denies any area and are alleviated factors. Denies any recent fall, trauma or injury. Patient reports pain is consistent with sickle cell pain crises he has had in the past. He states he has been taking his home pain meds (  ms contin) without relief. Denies fever, chills, headache, cough, shortness of breath, wheezing, lightheadedness, dizziness, abdominal pain, nausea, vomiting, diarrhea, numbness, tingling, weakness.  Past Medical History  Diagnosis Date  . Sickle cell crisis ([facility])   . Blood transfusion   . Avascular necrosis of hip ([facility])     bilateral  . Infection of bone, shoulder region ([facility])     left shoulder  . Pneumonia   . Avascular necrosis of hip, left ([facility]) 08/27/2011   Past Surgical History  Procedure Laterality Date  . Orif right hip fracture  1995  . Joint replacement  2006    right total hip arthroplasty  . Bone graft hip iliac crest     Family History  Problem Relation Age of Onset  . Adopted: Yes   Social History  Substance Use Topics  . Smoking status: Current Some Day Smoker -- 0.50 packs/day    Types: Cigarettes    Last Attempt to Quit: 01/27/2012  . Smokeless tobacco: Never Used  . Alcohol Use: No    Review of Systems  Cardiovascular: Positive for chest pain (right lateral rib pain).  All other systems reviewed and are negative.     Allergies  Review of patient's allergies indicates no known  allergies.  Home Medications   Prior to Admission medications   Medication Sig Start Date End Date Taking? Authorizing Provider  folic acid (FOLVITE) 1 MG tablet Take 1 mg by mouth daily.   Yes Historical Provider, MD  ibuprofen (ADVIL,MOTRIN) 800 MG tablet Take 800 mg by mouth 3 (three) times daily as needed (PAIN).   Yes Historical Provider, MD  morphine (MS CONTIN) 60 MG 12 hr tablet Take 1 tablet (60 mg total) by mouth 2 (two) times daily. Patient taking differently: Take 60 mg by mouth 2 (two) times daily as needed for pain.  11/20/11  Yes Grayce Sessions, NP  morphine (MSIR) 30 MG tablet Take 30 mg by mouth 2 (two) times daily as needed for severe pain (pain).    Yes Historical Provider, MD  promethazine (PHENERGAN) 25 MG tablet Take 25 mg by mouth every 6 (six) hours as needed for nausea (nausea).    Yes Historical Provider, MD   BP 104/62 mmHg  Pulse 54  Temp(Src) 98.4 F (36.9 C) (Oral)  Resp 11  Wt 77.111 kg  SpO2 98% Physical Exam  Constitutional: He is oriented to person, place, and time. He appears well-developed and well-nourished.  HENT:  Head: Normocephalic and atraumatic.  Mouth/Throat: Oropharynx is clear and moist. No oropharyngeal exudate.  Eyes: Conjunctivae and EOM are normal. Pupils are equal, round, and reactive to light. Right eye exhibits no discharge. Left eye exhibits no discharge. No scleral icterus.  Neck: Normal range of motion. Neck supple.  Cardiovascular: Normal rate, regular rhythm, normal heart sounds and intact distal pulses.   Pulmonary/Chest: Effort normal and breath sounds normal. No respiratory distress. He has no wheezes. He has no rales. He exhibits no tenderness, no bony tenderness, no laceration, no crepitus, no edema, no deformity, no swelling and no retraction.  Abdominal: Soft. Bowel sounds are normal. He exhibits no distension and no mass. There is no tenderness. There is no rebound and no guarding.  Musculoskeletal: Normal range of  motion. He exhibits no edema.  Lymphadenopathy:    He has no cervical adenopathy.  Neurological: He is alert and oriented to person, place, and time.  Skin: Skin is warm and dry.  Nursing note and vitals reviewed.   ED Course  Procedures (including critical care time) Labs Review Labs Reviewed  COMPREHENSIVE METABOLIC PANEL - Abnormal; Notable for the following:    ALT 15 (*)    Total Bilirubin 2.7 (*)    All other components within normal limits  CBC WITH DIFFERENTIAL/PLATELET - Abnormal; Notable for the following:    WBC 14.0 (*)    RBC 3.22 (*)    Hemoglobin 10.8 (*)    HCT 30.1 (*)    Neutro Abs 8.0 (*)    Monocytes Absolute 1.3 (*)    Eosinophils Absolute 1.0 (*)    All other components within normal limits  RETICULOCYTES - Abnormal; Notable for the following:    RBC. 3.19 (*)    All other components within normal limits  I-STAT TROPOININ, ED    Imaging Review Dg Chest 2 View  07/14/2015  CLINICAL DATA:  Sickle cell pain crisis; SOB and generalized body pain today; hx PNA; smoker EXAM: CHEST  2 VIEW COMPARISON:  05/25/2015 FINDINGS: Heart size is normal. Emphysematous changes are present. Chronic bronchitic changes are noted. There are no focal consolidations or pleural effusions. No pulmonary edema. IMPRESSION: 1. Chronic emphysematous and bronchitic changes. 2.  No evidence for acute  abnormality. Electronically Signed   By: Norva PavlovElizabeth  Brown M.D.   On: 07/14/2015 10:29   I have personally reviewed and evaluated these images and lab results as part of my medical decision-making.   EKG Interpretation   Date/Time:  Sunday July 14 2015 09:47:40 EDT Ventricular Rate:  75 PR Interval:  144 QRS Duration: 94 QT Interval:  400 QTC Calculation: 447 R Axis:   18 Text Interpretation:  Sinus rhythm ST elev, probable normal early repol  pattern Baseline wander in lead(s) III aVF Confirmed by COOK  MD, BRIAN  ([id number]) on 07/14/2015 9:53:22 AM      MDM   Final diagnoses:   Sickle cell pain crisis ([facility])    Patient presents with right lateral rib pain consistent with sickle cell pain crisis, 9/10. Denies fever. No relief with home pain meds. VSS. Exam unremarkable. Pt given IVF, zofran and IV pain meds in the ED. EKG showed sinus rhythm with normal early repolarization. Trop negative. CXR negative. After 3 doses of 2 mg IV Dilaudid and patient reports his pain has not improved. Consulted hospitalist for pain control regarding patient sickle cell pain crisis. Dr. Mikeal HawthorneGarba reports he will come evaluate the pt in the ED. Dr. Mikeal HawthorneGarba agrees to admission. Discussed results and plan for admission with pt.    Satira Sarkicole Elizabeth [place] Nation[place], [place]PA-C 07/14/15 1319  Donnetta HutchingBrian Cook, MD 07/14/15 1520

## 2015-07-14 NOTE — ED Notes (Signed)
Pt states right sided "sickle cell pain" starting 3 days prior. Denies CP or SOB. Pain 9/10

## 2015-07-15 LAB — COMPREHENSIVE METABOLIC PANEL
ALBUMIN: 4.2 g/dL (ref 3.5–5.0)
ALK PHOS: 73 U/L (ref 38–126)
ALT: 11 U/L — ABNORMAL LOW (ref 17–63)
ANION GAP: 8 (ref 5–15)
AST: 22 U/L (ref 15–41)
BUN: 15 mg/dL (ref 6–20)
CO2: 27 mmol/L (ref 22–32)
Calcium: 8.9 mg/dL (ref 8.9–10.3)
Chloride: 105 mmol/L (ref 101–111)
Creatinine, Ser: 0.76 mg/dL (ref 0.61–1.24)
GFR calc Af Amer: >60 mL/min (ref >60)
GFR calc non Af Amer: >60 mL/min (ref >60)
GLUCOSE: 95 mg/dL (ref 65–99)
POTASSIUM: 4.1 mmol/L (ref 3.5–5.1)
SODIUM: 140 mmol/L (ref 135–145)
Total Bilirubin: 3.5 mg/dL — ABNORMAL HIGH (ref 0.3–1.2)
Total Protein: 7.4 g/dL (ref 6.5–8.1)

## 2015-07-15 LAB — CBC WITH DIFFERENTIAL/PLATELET
BASOS PCT: 0 %
Basophils Absolute: 0 10*3/uL (ref 0.0–0.1)
Eosinophils Absolute: 1 10*3/uL — ABNORMAL HIGH (ref 0.0–0.7)
Eosinophils Relative: 6 %
HEMATOCRIT: 27.6 % — AB (ref 39.0–52.0)
HEMOGLOBIN: 9.7 g/dL — AB (ref 13.0–17.0)
LYMPHS PCT: 16 %
Lymphs Abs: 2.7 10*3/uL (ref 0.7–4.0)
MCH: 32.7 pg (ref 26.0–34.0)
MCHC: 35.1 g/dL (ref 30.0–36.0)
MCV: 92.9 fL (ref 78.0–100.0)
MONOS PCT: 7 %
Monocytes Absolute: 1.2 10*3/uL — ABNORMAL HIGH (ref 0.1–1.0)
NEUTROS ABS: 12.1 10*3/uL — AB (ref 1.7–7.7)
Neutrophils Relative %: 71 %
Platelets: 245 10*3/uL (ref 150–400)
RBC: 2.97 MIL/uL — ABNORMAL LOW (ref 4.22–5.81)
RDW: 14.5 % (ref 11.5–15.5)
WBC: 17 10*3/uL — ABNORMAL HIGH (ref 4.0–10.5)

## 2015-07-15 MED ORDER — PROMETHAZINE HCL 25 MG/ML IJ SOLN
12.5000 mg | Freq: Once | INTRAMUSCULAR | Status: AC
  Administered 2015-07-15: 12.5 mg via INTRAVENOUS
  Filled 2015-07-15: qty 1

## 2015-07-15 NOTE — Progress Notes (Signed)
Subjective: A 40 year old gentleman admitted with sickle cell painful crisis. Patient on Dilaudid PCA, Toradol and IV fluids. He has used 32.5 mg of the Dilaudid with 86 demands and 65 deliveries. His pain is down to 8 out of 10 from 10 out of 10 yesterday. Denied any fever no chills no shortness of breath no cough no nausea vomiting or diarrhea.  Objective: Vital signs in last 24 hours: Temp:  [97.1 F (36.2 C)-99.1 F (37.3 C)] 99.1 F (37.3 C) (03/20 1350) Pulse Rate:  [63-76] 64 (03/20 1350) Resp:  [7-18] 11 (03/20 1553) BP: (109-130)/(62-74) 109/62 mmHg (03/20 1350) SpO2:  [90 %-99 %] 96 % (03/20 1553) Weight change:  Last BM Date: 07/13/15  Intake/Output from previous day: 03/19 0701 - 03/20 0700 In: 480 [P.O.:480] Out: -  Intake/Output this shift: Total I/O In: 440 [P.O.:440] Out: 1500 [Urine:1500]  General appearance: alert, cooperative and no distress Neck: no adenopathy, no carotid bruit, no JVD, supple, symmetrical, trachea midline and thyroid not enlarged, symmetric, no tenderness/mass/nodules Back: symmetric, no curvature. ROM normal. No CVA tenderness. Resp: clear to auscultation bilaterally Chest wall: no tenderness Cardio: regular rate and rhythm, S1, S2 normal, no murmur, click, rub or gallop GI: soft, non-tender; bowel sounds normal; no masses,  no organomegaly Extremities: extremities normal, atraumatic, no cyanosis or edema Pulses: 2+ and symmetric Skin: Skin color, texture, turgor normal. No rashes or lesions Neurologic: Grossly normal  Lab Results:  Recent Labs  07/14/15 0928 07/15/15 0352  WBC 14.0* 17.0*  HGB 10.8* 9.7*  HCT 30.1* 27.6*  PLT 333 245   BMET  Recent Labs  07/14/15 0928 07/15/15 0352  NA 142 140  K 3.5 4.1  CL 107 105  CO2 26 27  GLUCOSE 86 95  BUN 16 15  CREATININE 0.77 0.76  CALCIUM 9.1 8.9    Studies/Results: Dg Chest 2 View  07/14/2015  CLINICAL DATA:  Sickle cell pain crisis; SOB and generalized body pain  today; hx PNA; smoker EXAM: CHEST  2 VIEW COMPARISON:  05/25/2015 FINDINGS: Heart size is normal. Emphysematous changes are present. Chronic bronchitic changes are noted. There are no focal consolidations or pleural effusions. No pulmonary edema. IMPRESSION: 1. Chronic emphysematous and bronchitic changes. 2.  No evidence for acute  abnormality. Electronically Signed   By: Norva PavlovElizabeth  Brown M.D.   On: 07/14/2015 10:29    Medications: I have reviewed the patient's current medications.  Assessment/Plan: A 40 year old gentleman with known history of sickle cell disease admitted with sickle cell painful crisis.  #1 sickle cell painful crisis: Patient will be maintain on his current regimen. He appears to be doing better. We'll keep him on long-acting MS Contin and may add his short acting tomorrow as we titrate him down in the PCA.  #2 sickle cell anemia: Hemoglobin dropped a little to 9.7 but still at his baseline. Most likely this is due to hemodilution. We'll continue to monitor.  #3 dehydration: Patient will be aggressively hydrated.  #4 tobacco abuse: Counseling has been provided. Nicotine patch will be offered to patient if needed.  #5 leukocytosis: Due to sickle cell crisis. Slightly worsened. Continue to monitor   LOS: 1 day   GARBA,[name] 07/15/2015, 5:03 PM

## 2015-07-16 NOTE — Progress Notes (Signed)
Subjective: Patient still has pain at 7/10 mainly in his rib cage and back. On Dilaudid PCA and used 37 mg with 80 demands and 74 deliveries. He is also on Toradol and his long acting MS Contin. Denied any fever no chills no shortness of breath no cough no nausea vomiting or diarrhea.  Objective: Vital signs in last 24 hours: Temp:  [98 F (36.7 C)-98.6 F (37 C)] 98.6 F (37 C) (03/21 1820) Pulse Rate:  [72-90] 76 (03/21 1820) Resp:  [8-22] 13 (03/21 2015) BP: (126-137)/(63-77) 137/76 mmHg (03/21 1820) SpO2:  [90 %-98 %] 92 % (03/21 2015) Weight:  [79.924 kg (176 lb 3.2 oz)] 79.924 kg (176 lb 3.2 oz) (03/21 0549) Weight change: 2.812 kg (6 lb 3.2 oz) Last BM Date: 07/13/15  Intake/Output from previous day: 03/20 0701 - 03/21 0700 In: 5007.1 [P.O.:440; I.V.:4567.1] Out: 5000 [Urine:5000] Intake/Output this shift:    General appearance: alert, cooperative and no distress Neck: no adenopathy, no carotid bruit, no JVD, supple, symmetrical, trachea midline and thyroid not enlarged, symmetric, no tenderness/mass/nodules Back: symmetric, no curvature. ROM normal. No CVA tenderness. Resp: clear to auscultation bilaterally Chest wall: no tenderness Cardio: regular rate and rhythm, S1, S2 normal, no murmur, click, rub or gallop GI: soft, non-tender; bowel sounds normal; no masses,  no organomegaly Extremities: extremities normal, atraumatic, no cyanosis or edema Pulses: 2+ and symmetric Skin: Skin color, texture, turgor normal. No rashes or lesions Neurologic: Grossly normal  Lab Results:  Recent Labs  07/14/15 0928 07/15/15 0352  WBC 14.0* 17.0*  HGB 10.8* 9.7*  HCT 30.1* 27.6*  PLT 333 245   BMET  Recent Labs  07/14/15 0928 07/15/15 0352  NA 142 140  K 3.5 4.1  CL 107 105  CO2 26 27  GLUCOSE 86 95  BUN 16 15  CREATININE 0.77 0.76  CALCIUM 9.1 8.9    Studies/Results: No results found.  Medications: I have reviewed the patient's current  medications.  Assessment/Plan: A 40 year old gentleman with known history of sickle cell disease admitted with sickle cell painful crisis.  #1 sickle cell painful crisis: Patient will be maintained on his current regimen. Will consider Physician assisted dosing if he is not doing well by tomorrow.  We'll keep him on long-acting MS Contin.  #2 sickle cell anemia: Monitor Hemoglobin   #3 dehydration: Patient will be aggressively hydrated.  #4 tobacco abuse: Counseling has been provided. Nicotine patch will be offered to patient if needed.  #5 leukocytosis: Due to sickle cell crisis. Continue to monitor   LOS: 2 days   Travis Purk,[name] 07/16/2015, 8:48 PM

## 2015-07-17 DIAGNOSIS — K5909 Other constipation: Secondary | ICD-10-CM

## 2015-07-17 LAB — CBC WITH DIFFERENTIAL/PLATELET
BASOS PCT: 1 %
Basophils Absolute: 0.1 10*3/uL (ref 0.0–0.1)
EOS ABS: 1.2 10*3/uL — AB (ref 0.0–0.7)
EOS PCT: 10 %
HCT: 24.3 % — ABNORMAL LOW (ref 39.0–52.0)
Hemoglobin: 8.7 g/dL — ABNORMAL LOW (ref 13.0–17.0)
LYMPHS ABS: 2.4 10*3/uL (ref 0.7–4.0)
Lymphocytes Relative: 20 %
MCH: 33.3 pg (ref 26.0–34.0)
MCHC: 35.8 g/dL (ref 30.0–36.0)
MCV: 93.1 fL (ref 78.0–100.0)
MONO ABS: 1.1 10*3/uL — AB (ref 0.1–1.0)
Monocytes Relative: 9 %
NEUTROS PCT: 60 %
Neutro Abs: 7.4 10*3/uL (ref 1.7–7.7)
PLATELETS: 240 10*3/uL (ref 150–400)
RBC: 2.61 MIL/uL — ABNORMAL LOW (ref 4.22–5.81)
RDW: 14.7 % (ref 11.5–15.5)
WBC: 12.2 10*3/uL — AB (ref 4.0–10.5)

## 2015-07-17 LAB — COMPREHENSIVE METABOLIC PANEL
ALT: 14 U/L — AB (ref 17–63)
ANION GAP: 8 (ref 5–15)
AST: 30 U/L (ref 15–41)
Albumin: 4.1 g/dL (ref 3.5–5.0)
Alkaline Phosphatase: 73 U/L (ref 38–126)
BUN: 8 mg/dL (ref 6–20)
CHLORIDE: 105 mmol/L (ref 101–111)
CO2: 27 mmol/L (ref 22–32)
CREATININE: 0.74 mg/dL (ref 0.61–1.24)
Calcium: 9.1 mg/dL (ref 8.9–10.3)
Glucose, Bld: 100 mg/dL — ABNORMAL HIGH (ref 65–99)
POTASSIUM: 4 mmol/L (ref 3.5–5.1)
SODIUM: 140 mmol/L (ref 135–145)
Total Bilirubin: 3.7 mg/dL — ABNORMAL HIGH (ref 0.3–1.2)
Total Protein: 7.2 g/dL (ref 6.5–8.1)

## 2015-07-17 MED ORDER — DIPHENHYDRAMINE HCL 25 MG PO CAPS
25.0000 mg | ORAL_CAPSULE | ORAL | Status: DC | PRN
Start: 2015-07-17 — End: 2015-07-19
  Administered 2015-07-18 – 2015-07-19 (×2): 50 mg via ORAL
  Filled 2015-07-17 (×2): qty 2

## 2015-07-17 MED ORDER — HYDROMORPHONE HCL 2 MG/ML IJ SOLN
2.0000 mg | INTRAMUSCULAR | Status: DC | PRN
  Administered 2015-07-17 – 2015-07-18 (×3): 2 mg via INTRAVENOUS
  Filled 2015-07-17: qty 1

## 2015-07-17 MED ORDER — SORBITOL 70 % SOLN
30.0000 mL | Freq: Every day | Status: DC | PRN
  Administered 2015-07-17 – 2015-07-18 (×2): 30 mL via ORAL
  Filled 2015-07-17: qty 30

## 2015-07-17 NOTE — Progress Notes (Signed)
SICKLE CELL SERVICE PROGRESS NOTE  [name] WUJ:[id number]:[id number] DOB: Jul 13, 1975 DOA: 07/14/2015 PCP: Dorrene GermanEdwin A Avbuere, MD  Assessment/Plan: Principal Problem:   Hemoglobin [place] with crisis [facility](HCC) Active Problems:   Sickle cell anemia with crisis (HCC)   Tobacco abuse   Anemia   Leukocytosis  1. Hb [place] with crisis: We'll discontinue the MS IR and acquisition assisted doses on a when necessary basis. Continue PCA current dose. Decrease IV fluids to [facility] and continue Toradol. 2. Leukocytosis: Patient has a very mild leukocytosis without any evidence of infection. Suspect this is secondary to his crisis. We'll continue to monitor 3. Anemia of chronic disease: Hemoglobin currently at baseline. 4. Chronic pain: Continue MS Contin 5. Constipation: Patient has MiraLAX ordered. Will ask nurse to administer today.  Code Status: Full Code Family Communication: N/A Disposition Plan: Not yet ready for discharge  Bran Aldridge A.  Pager [phone]. If 7PM-7AM, please contact night-coverage.  07/17/2015, 3:48 PM  LOS: 3 days   Interim History: Patient reports pain currently at 7 out of 10 and localized his ribs. He is used 27.5 mg with 58/55: Demands/deliveries in the last 24 hours on the PCA. He's not had a bowel movement since admission.  Consultants:  None  Procedures:  None  Antibiotics:  None   Objective: Filed Vitals:   07/17/15 1007 07/17/15 1106 07/17/15 1504 07/17/15 1512  BP: 116/71   123/75  Pulse: 69   75  Temp: 98.2 F (36.8 C)   98.4 F (36.9 C)  TempSrc: Oral   Oral  Resp: 9 15 12 12   Height:      Weight:      SpO2: 98% 100% 96% 96%   Weight change:   Intake/Output Summary (Last 24 hours) at 07/17/15 1548 Last data filed at 07/17/15 0554  Gross per 24 hour  Intake 4237.99 ml  Output   3800 ml  Net 437.99 ml    General: Alert, awake, oriented x3, in no acute distress.  HEENT: [place]/AT PEERL, EOMI, anicteric Neck: Trachea midline,  no masses, no  thyromegal,y no JVD, no carotid bruit OROPHARYNX:  Moist, No exudate/ erythema/lesions.  Heart: Regular rate and rhythm, without murmurs, rubs, gallops, PMI non-displaced, no heaves or thrills on palpation.  Lungs: Clear to auscultation, no wheezing or rhonchi noted. No increased vocal fremitus resonant to percussion  Abdomen: Soft, nontender, nondistended, positive bowel sounds, no masses no hepatosplenomegaly noted..  Neuro: No focal neurological deficits noted cranial nerves II through XII grossly intact. Strength at functional baseline in bilateral upper and lower extremities. Musculoskeletal: No warm swelling or erythema around joints, no spinal tenderness noted. Psychiatric: Patient alert and oriented x3, good insight and cognition, good recent to remote recall.    Data Reviewed: Basic Metabolic Panel:  Recent Labs Lab 07/14/15 0928 07/14/15 1620 07/15/15 0352 07/17/15 0335  NA 142  --  140 140  K 3.5  --  4.1 4.0  CL 107  --  105 105  CO2 26  --  27 27  GLUCOSE 86  --  95 100*  BUN 16  --  15 8  CREATININE 0.77  --  0.76 0.74  CALCIUM 9.1  --  8.9 9.1  MG  --  2.1  --   --    Liver Function Tests:  Recent Labs Lab 07/14/15 0928 07/15/15 0352 07/17/15 0335  AST 21 22 30   ALT 15* 11* 14*  ALKPHOS 75 73 73  BILITOT 2.7* 3.5* 3.7*  PROT 7.8 7.4  7.2  ALBUMIN 4.6 4.2 4.1   No results for input(s): LIPASE, AMYLASE in the last 168 hours. No results for input(s): AMMONIA in the last 168 hours. CBC:  Recent Labs Lab 07/14/15 0928 07/15/15 0352 07/17/15 0335  WBC 14.0* 17.0* 12.2*  NEUTROABS 8.0* 12.1* 7.4  HGB 10.8* 9.7* 8.7*  HCT 30.1* 27.6* 24.3*  MCV 93.5 92.9 93.1  PLT 333 245 240   Cardiac Enzymes: No results for input(s): CKTOTAL, CKMB, CKMBINDEX, TROPONINI in the last 168 hours. BNP (last 3 results) No results for input(s): BNP in the last 8760 hours.  ProBNP (last 3 results) No results for input(s): PROBNP in the last 8760 hours.  CBG: No  results for input(s): GLUCAP in the last 168 hours.  No results found for this or any previous visit (from the past 240 hour(s)).   Studies: Dg Chest 2 View  07/14/2015  CLINICAL DATA:  Sickle cell pain crisis; SOB and generalized body pain today; hx PNA; smoker EXAM: CHEST  2 VIEW COMPARISON:  05/25/2015 FINDINGS: Heart size is normal. Emphysematous changes are present. Chronic bronchitic changes are noted. There are no focal consolidations or pleural effusions. No pulmonary edema. IMPRESSION: 1. Chronic emphysematous and bronchitic changes. 2.  No evidence for acute  abnormality. Electronically Signed   By: Norva Pavlov M.D.   On: 07/14/2015 10:29    Scheduled Meds: . enoxaparin (LOVENOX) injection  30 mg Subcutaneous Q24H  . folic acid  1 mg Oral Daily  . HYDROmorphone   Intravenous 6 times per day  . morphine  60 mg Oral BID  . senna-docusate  1 tablet Oral BID   Continuous Infusions: . sodium chloride 75 mL/hr at 07/14/15 0941  . dextrose 5 % and 0.45% NaCl 125 mL/hr at 07/17/15 0433    Time spent 30 minutes

## 2015-07-17 NOTE — Care Management Important Message (Signed)
Important Message  Patient Details  Name: [name] E Lafavor MRN: [id number] Date of Birth: 1975/07/25   Medicare Important Message Given:  Yes    Haskell FlirtJamison, Farren Landa 07/17/2015, 9:05 AMImportant Message  Patient Details  Name: [name] E Tyler MRN: [id number] Date of Birth: 1975/07/25   Medicare Important Message Given:  Yes    Haskell FlirtJamison, Jae Bruck 07/17/2015, 9:05 AM

## 2015-07-17 NOTE — Care Management Note (Signed)
Case Management Note  Patient Details  Name: [name] MRN: [id number] Date of Birth: [birth year]  Subjective/Objective:        40 [facility]            Action/Plan: From home with parent.  Expected Discharge Date:                  Expected Discharge Plan:  Home/Self Care  In-House Referral:     Discharge planning Services  CM Consult  Post Acute Care Choice:    Choice offered to:     DME Arranged:    DME Agency:     HH Arranged:    HH Agency:     Status of Service:  In process, will continue to follow  Medicare Important Message Given:  Yes Date Medicare IM Given:    Medicare IM give by:    Date Additional Medicare IM Given:    Additional Medicare Important Message give by:     If discussed at Long Length of Stay Meetings, dates discussed:    Additional Comments: Chart reviewed and no CM needs identified or communicated at this time. CM will continue to follow. Sandford Crazeora Quasim Doyon RN,BSN,NCM [phone] Bartholome BillLEMENTS, Michelle Vanhise H, RN 07/17/2015, 3:24 PM

## 2015-07-18 MED ORDER — BISACODYL 10 MG RE SUPP
10.0000 mg | Freq: Once | RECTAL | Status: DC
  Filled 2015-07-18: qty 1

## 2015-07-18 MED ORDER — MAGNESIUM CITRATE PO SOLN
1.0000 | Freq: Once | ORAL | Status: AC
  Administered 2015-07-18: 1 via ORAL
  Filled 2015-07-18: qty 296

## 2015-07-18 NOTE — Progress Notes (Signed)
SICKLE CELL SERVICE PROGRESS NOTE  Malik PortHoward E [name] WUJ:[id number]:[id number] DOB: 1975-08-30 DOA: 07/14/2015 PCP: Dorrene GermanEdwin A Avbuere, MD  Assessment/Plan: Principal Problem:   Hemoglobin [place] with crisis [facility](HCC) Active Problems:   Sickle cell anemia with crisis (HCC)   Tobacco abuse   Anemia   Leukocytosis  1. Hb [place] with crisis: We'll continue assisted doses on a when necessary basis, and PCA at current dose. Continue Toradol. 2. Leukocytosis: Patient has a very mild leukocytosis without any evidence of infection. Suspect this is secondary to his crisis. We'll continue to monitor 3. Anemia of chronic disease: Hemoglobin currently at baseline. 4. Chronic pain: Continue MS Contin 5. Constipation: Patient still has not had a BM despite MiraLAX. Will give Magnesium Citrate.   Code Status: Full Code Family Communication: N/A Disposition Plan: Anticipate discharge home tomorrow.  Maxmillian Carsey A.  Pager [phone]. If 7PM-7AM, please contact night-coverage.  07/18/2015, 2:08 PM  LOS: 4 days   Interim History: Patient reports pain currently at 6-7/10 and localized his ribs. He is used 32mg  with 66/56: Demands/deliveries in the last 24 hours on the PCA. He's not had a bowel movement since admission.  Consultants:  None  Procedures:  None  Antibiotics:  None   Objective: Filed Vitals:   07/18/15 0641 07/18/15 0739 07/18/15 0951 07/18/15 1147  BP: 131/80  118/74   Pulse: 67  57   Temp: 98.9 F (37.2 C)  98.3 F (36.8 C)   TempSrc: Oral  Oral   Resp: 10 14 10 10   Height:      Weight:      SpO2: 91% 91% 96% 91%   Weight change:   Intake/Output Summary (Last 24 hours) at 07/18/15 1408 Last data filed at 07/18/15 1030  Gross per 24 hour  Intake   2340 ml  Output   2900 ml  Net   -560 ml    General: Alert, awake, oriented x3, in mild distress.  HEENT: [place]/AT PEERL, EOMI, anicteric Neck: Trachea midline,  no masses, no thyromegal,y no JVD, no carotid bruit OROPHARYNX:  Moist, No  exudate/ erythema/lesions.  Heart: Regular rate and rhythm, without murmurs, rubs, gallops, PMI non-displaced, no heaves or thrills on palpation.  Lungs: Clear to auscultation, no wheezing or rhonchi noted. No increased vocal fremitus resonant to percussion  Abdomen: Soft, nontender, nondistended, positive bowel sounds, no masses no hepatosplenomegaly noted..  Neuro: No focal neurological deficits noted cranial nerves II through XII grossly intact. Strength at functional baseline in bilateral upper and lower extremities. Musculoskeletal: No warm swelling or erythema around joints, no spinal tenderness noted. Psychiatric: Patient alert and oriented x3, good insight and cognition, good recent to remote recall.    Data Reviewed: Basic Metabolic Panel:  Recent Labs Lab 07/14/15 0928 07/14/15 1620 07/15/15 0352 07/17/15 0335  NA 142  --  140 140  K 3.5  --  4.1 4.0  CL 107  --  105 105  CO2 26  --  27 27  GLUCOSE 86  --  95 100*  BUN 16  --  15 8  CREATININE 0.77  --  0.76 0.74  CALCIUM 9.1  --  8.9 9.1  MG  --  2.1  --   --    Liver Function Tests:  Recent Labs Lab 07/14/15 0928 07/15/15 0352 07/17/15 0335  AST 21 22 30   ALT 15* 11* 14*  ALKPHOS 75 73 73  BILITOT 2.7* 3.5* 3.7*  PROT 7.8 7.4 7.2  ALBUMIN 4.6 4.2 4.1  No results for input(s): LIPASE, AMYLASE in the last 168 hours. No results for input(s): AMMONIA in the last 168 hours. CBC:  Recent Labs Lab 07/14/15 0928 07/15/15 0352 07/17/15 0335  WBC 14.0* 17.0* 12.2*  NEUTROABS 8.0* 12.1* 7.4  HGB 10.8* 9.7* 8.7*  HCT 30.1* 27.6* 24.3*  MCV 93.5 92.9 93.1  PLT 333 245 240   Cardiac Enzymes: No results for input(s): CKTOTAL, CKMB, CKMBINDEX, TROPONINI in the last 168 hours. BNP (last 3 results) No results for input(s): BNP in the last 8760 hours.  ProBNP (last 3 results) No results for input(s): PROBNP in the last 8760 hours.  CBG: No results for input(s): GLUCAP in the last 168 hours.  No results  found for this or any previous visit (from the past 240 hour(s)).   Studies: Dg Chest 2 View  07/14/2015  CLINICAL DATA:  Sickle cell pain crisis; SOB and generalized body pain today; hx PNA; smoker EXAM: CHEST  2 VIEW COMPARISON:  05/25/2015 FINDINGS: Heart size is normal. Emphysematous changes are present. Chronic bronchitic changes are noted. There are no focal consolidations or pleural effusions. No pulmonary edema. IMPRESSION: 1. Chronic emphysematous and bronchitic changes. 2.  No evidence for acute  abnormality. Electronically Signed   By: Norva Pavlov M.D.   On: 07/14/2015 10:29    Scheduled Meds: . enoxaparin (LOVENOX) injection  30 mg Subcutaneous Q24H  . folic acid  1 mg Oral Daily  . HYDROmorphone   Intravenous 6 times per day  . morphine  60 mg Oral BID  . senna-docusate  1 tablet Oral BID   Continuous Infusions: . sodium chloride 10 mL/hr at 07/17/15 1727    Time spent 30 minutes

## 2015-07-19 DIAGNOSIS — Z720 Tobacco use: Secondary | ICD-10-CM

## 2015-07-19 NOTE — Discharge Summary (Signed)
[name] MRN: [id number] DOB/AGE: 05/25/1975 40 y.o.  Admit date: 07/14/2015 Discharge date: 07/19/2015  Primary Care Physician:  Dorrene German, MD   Discharge Diagnoses:   Patient Active Problem List   Diagnosis Date Noted  . Sickle cell disease with crisis (HCC) 05/25/2015  . Chest pain 02/08/2015  . CAP (community acquired pneumonia) 02/08/2015  . Sickle cell crisis acute chest syndrome (HCC) 02/08/2015  . HCAP (healthcare-associated pneumonia) 02/08/2015  . Shoulder pain, right 02/08/2015  . Numbness and tingling in right hand   . Hemoglobin [place] with crisis (HCC) 10/13/2014  . Sickle cell anemia with pain (HCC) 05/31/2014  . Sickle cell crisis (HCC) 10/24/2013  . Sickle cell disease, type [place] (HCC) 10/18/2012  . Sickle cell pain crisis (HCC) 10/13/2012  . History of tobacco abuse 10/13/2012  . Hypokalemia 05/02/2012  . Leukocytosis 05/02/2012  . Anemia 11/08/2011  . Avascular necrosis of hip, left (HCC) 08/27/2011  . Elevated brain natriuretic peptide (BNP) level 08/25/2011  . Acute chest syndrome (HCC) 05/15/2011  . Tobacco abuse 05/15/2011  . Constipation 05/14/2011  . Sickle cell anemia with crisis (HCC) 05/13/2011    DISCHARGE MEDICATION:   Medication List    TAKE these medications        folic acid 1 MG tablet  Commonly known as:  FOLVITE  Take 1 mg by mouth daily.     ibuprofen 800 MG tablet  Commonly known as:  ADVIL,MOTRIN  Take 800 mg by mouth 3 (three) times daily as needed (PAIN).     morphine 30 MG tablet  Commonly known as:  MSIR  Take 30 mg by mouth 2 (two) times daily as needed for severe pain (pain).     morphine 60 MG 12 hr tablet  Commonly known as:  MS CONTIN  Take 1 tablet (60 mg total) by mouth 2 (two) times daily.     promethazine 25 MG tablet  Commonly known as:  PHENERGAN  Take 25 mg by mouth every 6 (six) hours as needed for nausea (nausea).          Consults:     SIGNIFICANT DIAGNOSTIC STUDIES:  Dg Chest 2  View  07/14/2015  CLINICAL DATA:  Sickle cell pain crisis; SOB and generalized body pain today; hx PNA; smoker EXAM: CHEST  2 VIEW COMPARISON:  05/25/2015 FINDINGS: Heart size is normal. Emphysematous changes are present. Chronic bronchitic changes are noted. There are no focal consolidations or pleural effusions. No pulmonary edema. IMPRESSION: 1. Chronic emphysematous and bronchitic changes. 2.  No evidence for acute  abnormality. Electronically Signed   By: Norva Pavlov M.D.   On: 07/14/2015 10:29     No results found for this or any previous visit (from the past 240 hour(s)).  BRIEF ADMITTING H & P: Pain all over but especially rib cage HPI: Patient is a 40 year old gentleman with hemoglobin  disease well-known to our service who presented to the ER with 3 days of pain mainly on his right side over his rib cage rated as 10 out of 10 not relieved by his home medications. Pain is worse with movement and not relieved by anything. It feels like his typical sickle cell crisis. Patient was seen in the ER and has received 6 mg of Dilaudid with minimal relief. He has no shortness of breath or cough no nausea vomiting or diarrhea. Patient said he took his home medications and he is not out of it. He appears dehydrated and a mild distress hence  is being admitted to the hospital for further treatment.   Hospital Course:  Present on Admission:  . Hemoglobin [place] with crisis [facility](HCC): The patient's pain was managed with continued MS Contin for management of chronic pain, a Dilaudid PCA, IV Toradol and IV fluids. As the pain improve he was transitioned to oral analgesics. At the time of discharge his pain was under good control patient is ambulating without any difficulty.   . Anemia of chronic disease: Hemoglobin remained at baseline during hospitalization.   . Tobacco abuse: Patient has been counseled against further tobacco use.   . Leukocytosis: Patient had a mild leukocytosis without any evidence of  infection.   . Constipation: The patient had constipation for most of his hospitalization. He was treated on a daily basis with a gut motility agent and also received multiple laxatives. He finally had a bowel movement yesterday.   . Sickle cell disease (HCC): The patient reports that he's been having more frequent crises. He is currently not on hydroxyurea I have asked him to speak with his primary care physician about E there are referral to hematology for starting on hydroxyurea.  Disposition and Follow-up:  Patient is being discharged home in stable condition. He's to follow-up with his primary care doctor as needed.     Discharge Instructions    Activity as tolerated - No restrictions    Complete by:  As directed      Diet general    Complete by:  As directed            DISCHARGE EXAM:  General: Alert, awake, oriented x3, in mild distress.  Vital signs: BP 115/62, HR 65, T 97.6 F (36.4 C), temperature source Oral, RR 11, height 6\' 2"  (1.88 m), weight 176 lb 11.2 oz (80.151 kg), SpO2 100 % RA.  HEENT: [place]/AT PEERL, EOMI, anicteric Neck: Trachea midline, no masses, no thyromegal,y no JVD, no carotid bruit OROPHARYNX: Moist, No exudate/ erythema/lesions.  Heart: Regular rate and rhythm, without murmurs, rubs, gallops or S3. PMI non-displaced. Exam reveals no decreased pulses. Pulmonary/Chest: Normal effort. Breath sounds normal. No. Apnea. Clear to auscultation,no stridor,  no wheezing and no rhonchi noted. No respiratory distress and no tenderness noted. Abdomen: Soft, nontender, nondistended, normal bowel sounds, no masses no hepatosplenomegaly noted. No fluid wave and no ascites. There is no guarding or rebound. Neuro: Alert and oriented to person, place and time. Normal motor skills, Displays no atrophy or tremors and exhibits normal muscle tone.  No focal neurological deficits noted cranial nerves II through XII grossly intact. No sensory deficit noted.  Strength at baseline  in bilateral upper and lower extremities. Gait normal. Musculoskeletal: No warm swelling or erythema around joints, no spinal tenderness noted.Mood, memory, affect and judgement normal Psychiatric: Patient alert and oriented x3, good insight and cognition, good recent to remote recall. Skin: Skin is warm and dry. No bruising, no ecchymosis and no rash noted. Pt is not diaphoretic. No erythema. No pallor       Recent Labs  07/17/15 0335  NA 140  K 4.0  CL 105  CO2 27  GLUCOSE 100*  BUN 8  CREATININE 0.74  CALCIUM 9.1    Recent Labs  07/17/15 0335  AST 30  ALT 14*  ALKPHOS 73  BILITOT 3.7*  PROT 7.2  ALBUMIN 4.1   No results for input(s): LIPASE, AMYLASE in the last 72 hours.  Recent Labs  07/17/15 0335  WBC 12.2*  NEUTROABS 7.4  HGB 8.7*  HCT 24.3*  MCV 93.1  PLT 240     Total time spent including face to face and decision making was greater than 30 minutes  Signed: Alonso Gapinski A. 07/19/2015, 1:43 PM

## 2015-08-13 ENCOUNTER — Emergency Department (HOSPITAL_COMMUNITY): Payer: Medicare Other

## 2015-08-13 ENCOUNTER — Encounter (HOSPITAL_COMMUNITY): Payer: Self-pay

## 2015-08-13 ENCOUNTER — Observation Stay (HOSPITAL_COMMUNITY)
Admission: EM | Admit: 2015-08-13 | Discharge: 2015-08-13 | Disposition: A | Discharge status: Home / Self Care | Payer: Medicare Other | Attending: Internal Medicine | Admitting: Internal Medicine

## 2015-08-13 DIAGNOSIS — R0781 Pleurodynia: Secondary | ICD-10-CM | POA: Insufficient documentation

## 2015-08-13 DIAGNOSIS — Z79899 Other long term (current) drug therapy: Secondary | ICD-10-CM | POA: Diagnosis not present

## 2015-08-13 DIAGNOSIS — D5700 Hb-SS disease with crisis, unspecified: Secondary | ICD-10-CM | POA: Diagnosis not present

## 2015-08-13 DIAGNOSIS — F17210 Nicotine dependence, cigarettes, uncomplicated: Secondary | ICD-10-CM | POA: Diagnosis not present

## 2015-08-13 DIAGNOSIS — D571 Sickle-cell disease without crisis: Secondary | ICD-10-CM | POA: Diagnosis present

## 2015-08-13 LAB — RETICULOCYTES
RBC.: 3.31 MIL/uL — ABNORMAL LOW (ref 4.22–5.81)
Retic Count, Absolute: 86.1 K/uL (ref 19.0–186.0)
Retic Ct Pct: 2.6 % (ref 0.4–3.1)

## 2015-08-13 LAB — CBC WITH DIFFERENTIAL/PLATELET
Basophils Absolute: 0.1 10*3/uL (ref 0.0–0.1)
Basophils Relative: 1 %
Eosinophils Absolute: 1 10*3/uL — ABNORMAL HIGH (ref 0.0–0.7)
Eosinophils Relative: 9 %
HCT: 30.2 % — ABNORMAL LOW (ref 39.0–52.0)
HEMOGLOBIN: 11 g/dL — AB (ref 13.0–17.0)
LYMPHS ABS: 3.1 10*3/uL (ref 0.7–4.0)
LYMPHS PCT: 29 %
MCH: 32.9 pg (ref 26.0–34.0)
MCHC: 36.4 g/dL — ABNORMAL HIGH (ref 30.0–36.0)
MCV: 90.4 fL (ref 78.0–100.0)
Monocytes Absolute: 0.9 10*3/uL (ref 0.1–1.0)
Monocytes Relative: 8 %
NEUTROS PCT: 53 %
Neutro Abs: 5.8 10*3/uL (ref 1.7–7.7)
Platelets: 278 10*3/uL (ref 150–400)
RBC: 3.34 MIL/uL — AB (ref 4.22–5.81)
RDW: 13.9 % (ref 11.5–15.5)
WBC: 10.8 10*3/uL — AB (ref 4.0–10.5)

## 2015-08-13 LAB — COMPREHENSIVE METABOLIC PANEL
ALT: 13 U/L — ABNORMAL LOW (ref 17–63)
AST: 21 U/L (ref 15–41)
Albumin: 4.8 g/dL (ref 3.5–5.0)
Alkaline Phosphatase: 72 U/L (ref 38–126)
Anion gap: 5 (ref 5–15)
BUN: 16 mg/dL (ref 6–20)
CHLORIDE: 111 mmol/L (ref 101–111)
CO2: 25 mmol/L (ref 22–32)
Calcium: 9.3 mg/dL (ref 8.9–10.3)
Creatinine, Ser: 0.79 mg/dL (ref 0.61–1.24)
Glucose, Bld: 78 mg/dL (ref 65–99)
POTASSIUM: 4.3 mmol/L (ref 3.5–5.1)
Sodium: 141 mmol/L (ref 135–145)
Total Bilirubin: 2 mg/dL — ABNORMAL HIGH (ref 0.3–1.2)
Total Protein: 8.2 g/dL — ABNORMAL HIGH (ref 6.5–8.1)

## 2015-08-13 MED ORDER — ONDANSETRON HCL 4 MG/2ML IJ SOLN
4.0000 mg | Freq: Once | INTRAMUSCULAR | Status: AC
  Administered 2015-08-13: 4 mg via INTRAVENOUS
  Filled 2015-08-13: qty 2

## 2015-08-13 MED ORDER — SODIUM CHLORIDE 0.9 % IV BOLUS (SEPSIS)
1000.0000 mL | Freq: Once | INTRAVENOUS | Status: AC
  Administered 2015-08-13: 1000 mL via INTRAVENOUS

## 2015-08-13 MED ORDER — FENTANYL CITRATE (PF) 100 MCG/2ML IJ SOLN: 50.0000 ug | INTRAMUSCULAR | Status: DC | PRN

## 2015-08-13 MED ORDER — HYDROMORPHONE HCL 2 MG/ML IJ SOLN
2.0000 mg | Freq: Once | INTRAMUSCULAR | Status: AC
  Administered 2015-08-13: 2 mg via INTRAVENOUS
  Filled 2015-08-13: qty 1

## 2015-08-13 NOTE — ED Notes (Signed)
ED PA at bedside

## 2015-08-13 NOTE — ED Notes (Signed)
TWO UNSUCCESSFUL ATTEMPTS TO COLLECT BLOOD SAMPLES 

## 2015-08-13 NOTE — ED Notes (Signed)
EDPA SAMATHA at bedside.

## 2015-08-13 NOTE — ED Notes (Signed)
PT DECLINES ADMISSION STATING HE WANTS TO GO HOME. HE STATES HGB AND XRAY LOOKS GOOD. EDP JAMES AND EDPA SAMATHA MADE AWARE. 3 WEST MADE AWARE OF PT CURRENT STATUS. KAREN UNIT SECRET. CALLED BED PLACEMENT.

## 2015-08-13 NOTE — ED Notes (Signed)
Patient transported to X-ray 

## 2015-08-13 NOTE — Progress Notes (Signed)
Received call from ED stating that patietn has declined admission.

## 2015-08-13 NOTE — ED Notes (Signed)
Pt states rt lower rib pain with slight cough x 4 days.  However, pt has sickle cell anemia and states that pain is usually in rib cage.  No fever.  No shortness of breath or chest pain.

## 2015-08-13 NOTE — ED Provider Notes (Signed)
CSN: [id number]     Arrival date & time 08/13/15  [id number] History   First MD Initiated Contact with Patient 08/13/15 1015     Chief Complaint  Patient presents with  . Sickle Cell Pain Crisis     (Consider location/radiation/quality/duration/timing/severity/associated sxs/prior Treatment) HPI   [name] is a 40 year old male with a past history of sickle cell anemia, acute chest syndrome who presents to the ED complaining of right-sided rib pain. Patient states that he has been experiencing right-sided rib pain for the last 3 days has progressively worsening. Pain does not increase with deep inspiration. He states this feels like his typical sickle cell pain. He denies chest pain, shortness of breath, fevers, chills, cough. He is taking home MS Contin without relief.  Past Medical History  Diagnosis Date  . Sickle cell crisis (HCC)   . Blood transfusion   . Avascular necrosis of hip (HCC)     bilateral  . Infection of bone, shoulder region (HCC)     left shoulder  . Pneumonia   . Avascular necrosis of hip, left (HCC) 08/27/2011   Past Surgical History  Procedure Laterality Date  . Orif right hip fracture  1995  . Joint replacement  2006    right total hip arthroplasty  . Bone graft hip iliac crest     Family History  Problem Relation Age of Onset  . Adopted: Yes   Social History  Substance Use Topics  . Smoking status: Current Some Day Smoker -- 0.50 packs/day    Types: Cigarettes    Last Attempt to Quit: 01/27/2012  . Smokeless tobacco: Never Used  . Alcohol Use: No    Review of Systems  All other systems reviewed and are negative.     Allergies  Review of patient's allergies indicates no known allergies.  Home Medications   Prior to Admission medications   Medication Sig Start Date End Date Taking? Authorizing Provider  cyclobenzaprine (FLEXERIL) 10 MG tablet Take 10 mg by mouth at bedtime.   Yes Historical Provider, MD  folic acid (FOLVITE) 1 MG tablet  Take 1 mg by mouth daily.   Yes Historical Provider, MD  ibuprofen (ADVIL,MOTRIN) 800 MG tablet Take 800 mg by mouth 3 (three) times daily as needed (PAIN).   Yes Historical Provider, MD  morphine (MS CONTIN) 60 MG 12 hr tablet Take 1 tablet (60 mg total) by mouth 2 (two) times daily. Patient taking differently: Take 60 mg by mouth 2 (two) times daily as needed for pain.  11/20/11  Yes Grayce SessionsMichelle P Edwards, NP  morphine (MSIR) 30 MG tablet Take 30 mg by mouth 2 (two) times daily as needed for severe pain (pain).    Yes Historical Provider, MD  promethazine (PHENERGAN) 25 MG tablet Take 25 mg by mouth every 6 (six) hours as needed for nausea (nausea).    Yes Historical Provider, MD   BP 115/85 mmHg  Pulse 87  Temp(Src) 98.1 F (36.7 C) (Oral)  Resp 18  SpO2 98% Physical Exam  Constitutional: He is oriented to person, place, and time. He appears well-developed and well-nourished. No distress.  HENT:  Head: Normocephalic and atraumatic.  Mouth/Throat: No oropharyngeal exudate.  Eyes: Conjunctivae and EOM are normal. Pupils are equal, round, and reactive to light. Right eye exhibits no discharge. Left eye exhibits no discharge. No scleral icterus.  Neck: Neck supple.  Cardiovascular: Normal rate, regular rhythm, normal heart sounds and intact distal pulses.  Exam reveals no gallop and  no friction rub.   No murmur heard. Pulmonary/Chest: Effort normal and breath sounds normal. No respiratory distress. He has no wheezes. He has no rales. He exhibits no tenderness.  Abdominal: Soft. Bowel sounds are normal. He exhibits no distension. There is no tenderness. There is no guarding.  Musculoskeletal: Normal range of motion. He exhibits no edema.  Lymphadenopathy:    He has no cervical adenopathy.  Neurological: He is alert and oriented to person, place, and time. No cranial nerve deficit.  Strength 5/5 throughout. No sensory deficits.    Skin: Skin is warm and dry. No rash noted. He is not  diaphoretic. No erythema. No pallor.  Psychiatric: He has a normal mood and affect. His behavior is normal.  Nursing note and vitals reviewed.   ED Course  Procedures (including critical care time) Labs Review Labs Reviewed  COMPREHENSIVE METABOLIC PANEL - Abnormal; Notable for the following:    Total Protein 8.2 (*)    ALT 13 (*)    Total Bilirubin 2.0 (*)    All other components within normal limits  CBC WITH DIFFERENTIAL/PLATELET - Abnormal; Notable for the following:    WBC 10.8 (*)    RBC 3.34 (*)    Hemoglobin 11.0 (*)    HCT 30.2 (*)    MCHC 36.4 (*)    Eosinophils Absolute 1.0 (*)    All other components within normal limits  RETICULOCYTES - Abnormal; Notable for the following:    RBC. 3.31 (*)    All other components within normal limits    Imaging Review Dg Chest 2 View  08/13/2015  CLINICAL DATA:  40 year old with mid chest pain worsening over the last 3 days. Sickle cell crisis. EXAM: CHEST  2 VIEW COMPARISON:  07/14/2015 and 05/25/2015. FINDINGS: The heart size and mediastinal contours are stable. There is stable mild scarring in both lung bases. No edema, superimposed airspace disease, pleural effusion or pneumothorax demonstrated. The bones appear unchanged without acute findings. IMPRESSION: Stable chronic basilar scarring.  No acute cardiopulmonary process. Electronically Signed   By: Carey Bullocks M.D.   On: 08/13/2015 12:38   I have personally reviewed and evaluated these images and lab results as part of my medical decision-making.   EKG Interpretation None      MDM   Final diagnoses:  Sickle cell disease with crisis (HCC)    40 year old male with history of sickle cell anemia presents with right-sided rib pain onset 3 days ago. He states his typical sickle cell pain which is uncontrolled with his home MS Contin. Hemoglobin stable at 11. Normal retic count. No sign of infection. Chest x-ray negative. No sign of acute chest. He was given IV fluids, 3  rounds of Dilaudid. Upon reassessment after last round of pain medication patient states that he does not feel like his home medications will control his pain and he would like to be admitted. I spoke with Dr. Ashley Royalty who agrees to admit the patient to her service.  While awaiting bed placement, patient states that he has changes when you would like to be discharged home at this time. As his blood work is normal in all of his vital signs are stable feel the patient is safe for discharge. I contacted Dr. Ashley Royalty to make her aware of patient's decision. Patient will follow-up with his PCP as needed.    Lester Kinsman [place], PA-C 08/13/15 1510  Rolland Porter, MD 08/22/15 [phone]

## 2015-08-13 NOTE — ED Notes (Signed)
EDP JAMES AND EDPA SAMATHA MADE AWARE PT HAS QUESTIONS ABOUT ADMISSION. i THOUGHT SOMEONE FROM SICKLE CELL WOULD COME SPEAK TO ME". i MADE HIM AWARE OF HIS ADMISSION. PT STATES HE THOUGHT HE FELT WELL ENOUGH TO GO HOME.

## 2015-08-13 NOTE — ED Notes (Signed)
DR. Jerolyn CenterMATHEWS CALLED AND UPDATED ON PT'S CURRENT STATUS

## 2015-08-13 NOTE — Discharge Instructions (Signed)
Sickle Cell Anemia, Adult °Sickle cell anemia is a condition in which red blood cells have an abnormal "sickle" shape. This abnormal shape shortens the cells' life span, which results in a lower than normal concentration of red blood cells in the blood. The sickle shape also causes the cells to clump together and block free blood flow through the blood vessels. As a result, the tissues and organs of the body do not receive enough oxygen. Sickle cell anemia causes organ damage and pain and increases the risk of infection. °CAUSES  °Sickle cell anemia is a genetic disorder. Those who receive two copies of the gene have the condition, and those who receive one copy have the trait. °RISK FACTORS °The sickle cell gene is most common in people whose families originated in [place]. Other areas of the globe where sickle cell trait occurs include the Mediterranean, [place] and [place], the [place], and the [place].  °SIGNS AND SYMPTOMS °· Pain, especially in the extremities, back, chest, or abdomen (common). The pain may start suddenly or may develop following an illness, especially if there is dehydration. Pain can also occur due to overexertion or exposure to extreme temperature changes. °· Frequent severe bacterial infections, especially certain types of pneumonia and meningitis. °· Pain and swelling in the hands and feet. °· Decreased activity.   °· Loss of appetite.   °· Change in behavior. °· Headaches. °· Seizures. °· Shortness of breath or difficulty breathing. °· Vision changes. °· Skin ulcers. °Those with the trait may not have symptoms or they may have mild symptoms.  °DIAGNOSIS  °Sickle cell anemia is diagnosed with blood tests that demonstrate the genetic trait. It is often diagnosed during the newborn period, due to mandatory testing nationwide. A variety of blood tests, X-rays, CT scans, MRI scans, ultrasounds, and lung function tests may also be done to monitor the condition. °TREATMENT  °Sickle  cell anemia may be treated with: °· Medicines. You may be given pain medicines, antibiotic medicines (to treat and prevent infections) or medicines to increase the production of certain types of hemoglobin. °· Fluids. °· Oxygen. °· Blood transfusions. °HOME CARE INSTRUCTIONS  °· Drink enough fluid to keep your urine clear or pale yellow. Increase your fluid intake in hot weather and during exercise. °· Do not smoke. Smoking lowers oxygen levels in the blood.   °· Only take over-the-counter or prescription medicines for pain, fever, or discomfort as directed by your health care provider. °· Take antibiotics as directed by your health care provider. Make sure you finish them it even if you start to feel better.   °· Take supplements as directed by your health care provider.   °· Consider wearing a medical alert bracelet. This tells anyone caring for you in an emergency of your condition.   °· When traveling, keep your medical information, health care provider's names, and the medicines you take with you at all times.   °· If you develop a fever, do not take medicines to reduce the fever right away. This could cover up a problem that is developing. Notify your health care provider. °· Keep all follow-up appointments with your health care provider. Sickle cell anemia requires regular medical care. °SEEK MEDICAL CARE IF: ° You have a fever. °SEEK IMMEDIATE MEDICAL CARE IF:  °· You feel dizzy or faint.   °· You have new abdominal pain, especially on the left side near the stomach area.   °· You develop a persistent, often uncomfortable and painful penile erection (priapism). If this is not treated immediately it   will lead to impotence.   You have numbness your arms or legs or you have a hard time moving them.   You have a hard time with speech.   You have a fever or persistent symptoms for more than 2-3 days.   You have a fever and your symptoms suddenly get worse.   You have signs or symptoms of infection.  These include:   Chills.   Abnormal tiredness (lethargy).   Irritability.   Poor eating.   Vomiting.   You develop pain that is not helped with medicine.   You develop shortness of breath.  You have pain in your chest.   You are coughing up pus-like or bloody sputum.   You develop a stiff neck.  Your feet or hands swell or have pain.  Your abdomen appears bloated.  You develop joint pain. MAKE SURE YOU:  Understand these instructions.   This information is not intended to replace advice given to you by your health care provider. Make sure you discuss any questions you have with your health care provider.   Take home pain medication as prescribed. Follow up with your primary care provider as needed. Return to the ED if you experience severe worsening of your symptoms, difficulty breathing, lower leg swelling, fever, chills.

## 2015-08-14 LAB — CBG MONITORING, ED: Glucose-Capillary: 117 mg/dL — ABNORMAL HIGH (ref 65–99)

## 2015-10-16 ENCOUNTER — Inpatient Hospital Stay (HOSPITAL_COMMUNITY)
Admission: EM | Admit: 2015-10-16 | Discharge: 2015-10-20 | DRG: 812 | Disposition: A | Discharge status: Home / Self Care | Payer: Medicare Other | Attending: Internal Medicine | Admitting: Internal Medicine

## 2015-10-16 ENCOUNTER — Encounter (HOSPITAL_COMMUNITY): Payer: Self-pay | Admitting: Emergency Medicine

## 2015-10-16 ENCOUNTER — Emergency Department (HOSPITAL_COMMUNITY): Payer: Medicare Other

## 2015-10-16 DIAGNOSIS — Z96641 Presence of right artificial hip joint: Secondary | ICD-10-CM | POA: Diagnosis present

## 2015-10-16 DIAGNOSIS — R110 Nausea: Secondary | ICD-10-CM | POA: Diagnosis present

## 2015-10-16 DIAGNOSIS — D638 Anemia in other chronic diseases classified elsewhere: Secondary | ICD-10-CM | POA: Diagnosis present

## 2015-10-16 DIAGNOSIS — E860 Dehydration: Secondary | ICD-10-CM | POA: Diagnosis present

## 2015-10-16 DIAGNOSIS — M879 Osteonecrosis, unspecified: Secondary | ICD-10-CM | POA: Diagnosis present

## 2015-10-16 DIAGNOSIS — D5700 Hb-SS disease with crisis, unspecified: Secondary | ICD-10-CM | POA: Diagnosis present

## 2015-10-16 DIAGNOSIS — K5900 Constipation, unspecified: Secondary | ICD-10-CM | POA: Diagnosis not present

## 2015-10-16 DIAGNOSIS — R059 Cough, unspecified: Secondary | ICD-10-CM | POA: Insufficient documentation

## 2015-10-16 DIAGNOSIS — D57219 Sickle-cell/Hb-C disease with crisis, unspecified: Secondary | ICD-10-CM | POA: Diagnosis not present

## 2015-10-16 DIAGNOSIS — R05 Cough: Secondary | ICD-10-CM

## 2015-10-16 DIAGNOSIS — F17210 Nicotine dependence, cigarettes, uncomplicated: Secondary | ICD-10-CM | POA: Diagnosis present

## 2015-10-16 DIAGNOSIS — J9811 Atelectasis: Secondary | ICD-10-CM | POA: Diagnosis present

## 2015-10-16 DIAGNOSIS — M79602 Pain in left arm: Secondary | ICD-10-CM | POA: Diagnosis present

## 2015-10-16 LAB — COMPREHENSIVE METABOLIC PANEL
ALT: 12 U/L — AB (ref 17–63)
AST: 22 U/L (ref 15–41)
Albumin: 4.6 g/dL (ref 3.5–5.0)
Alkaline Phosphatase: 76 U/L (ref 38–126)
Anion gap: 8 (ref 5–15)
BILIRUBIN TOTAL: 2.6 mg/dL — AB (ref 0.3–1.2)
BUN: 12 mg/dL (ref 6–20)
CO2: 23 mmol/L (ref 22–32)
CREATININE: 0.72 mg/dL (ref 0.61–1.24)
Calcium: 9 mg/dL (ref 8.9–10.3)
Chloride: 107 mmol/L (ref 101–111)
GFR calc Af Amer: >60 mL/min (ref >60)
Glucose, Bld: 85 mg/dL (ref 65–99)
Potassium: 3.8 mmol/L (ref 3.5–5.1)
Sodium: 138 mmol/L (ref 135–145)
TOTAL PROTEIN: 8.3 g/dL — AB (ref 6.5–8.1)

## 2015-10-16 LAB — RETICULOCYTES
RBC.: 3.31 MIL/uL — AB (ref 4.22–5.81)
RETIC COUNT ABSOLUTE: 82.8 10*3/uL (ref 19.0–186.0)
Retic Ct Pct: 2.5 % (ref 0.4–3.1)

## 2015-10-16 LAB — CBC WITH DIFFERENTIAL/PLATELET
BASOS ABS: 0.1 10*3/uL (ref 0.0–0.1)
Basophils Relative: 1 %
Eosinophils Absolute: 0.6 10*3/uL (ref 0.0–0.7)
Eosinophils Relative: 6 %
HCT: 29.6 % — ABNORMAL LOW (ref 39.0–52.0)
Hemoglobin: 10.9 g/dL — ABNORMAL LOW (ref 13.0–17.0)
LYMPHS ABS: 2.5 10*3/uL (ref 0.7–4.0)
Lymphocytes Relative: 26 %
MCH: 32.9 pg (ref 26.0–34.0)
MCHC: 36.8 g/dL — ABNORMAL HIGH (ref 30.0–36.0)
MCV: 89.4 fL (ref 78.0–100.0)
MONO ABS: 1.2 10*3/uL — AB (ref 0.1–1.0)
Monocytes Relative: 12 %
Neutro Abs: 5.3 10*3/uL (ref 1.7–7.7)
Neutrophils Relative %: 55 %
PLATELETS: 316 10*3/uL (ref 150–400)
RBC: 3.31 MIL/uL — AB (ref 4.22–5.81)
RDW: 14.3 % (ref 11.5–15.5)
WBC: 9.7 10*3/uL (ref 4.0–10.5)

## 2015-10-16 LAB — URINALYSIS, ROUTINE W REFLEX MICROSCOPIC
Bilirubin Urine: NEGATIVE
GLUCOSE, UA: NEGATIVE mg/dL
Hgb urine dipstick: NEGATIVE
KETONES UR: NEGATIVE mg/dL
LEUKOCYTES UA: NEGATIVE
NITRITE: NEGATIVE
PROTEIN: NEGATIVE mg/dL
Specific Gravity, Urine: 1.008 (ref 1.005–1.030)
pH: 6 (ref 5.0–8.0)

## 2015-10-16 MED ORDER — SENNOSIDES-DOCUSATE SODIUM 8.6-50 MG PO TABS
1.0000 | ORAL_TABLET | Freq: Two times a day (BID) | ORAL | Status: DC
  Administered 2015-10-16 – 2015-10-20 (×8): 1 via ORAL
  Filled 2015-10-16 (×8): qty 1

## 2015-10-16 MED ORDER — DIPHENHYDRAMINE HCL 25 MG PO CAPS
25.0000 mg | ORAL_CAPSULE | Freq: Four times a day (QID) | ORAL | Status: DC | PRN
  Administered 2015-10-19: 50 mg via ORAL
  Filled 2015-10-16: qty 2

## 2015-10-16 MED ORDER — NALOXONE HCL 0.4 MG/ML IJ SOLN: 0.4000 mg | INTRAMUSCULAR | Status: DC | PRN

## 2015-10-16 MED ORDER — SODIUM CHLORIDE 0.9% FLUSH
3.0000 mL | Freq: Two times a day (BID) | INTRAVENOUS | Status: DC
  Administered 2015-10-18 – 2015-10-19 (×2): 3 mL via INTRAVENOUS

## 2015-10-16 MED ORDER — SODIUM CHLORIDE 0.9 % IV BOLUS (SEPSIS)
1000.0000 mL | Freq: Once | INTRAVENOUS | Status: AC
  Administered 2015-10-16: 1000 mL via INTRAVENOUS

## 2015-10-16 MED ORDER — KETOROLAC TROMETHAMINE 30 MG/ML IJ SOLN
30.0000 mg | Freq: Four times a day (QID) | INTRAMUSCULAR | Status: DC
  Administered 2015-10-16 – 2015-10-20 (×14): 30 mg via INTRAVENOUS
  Filled 2015-10-16 (×14): qty 1

## 2015-10-16 MED ORDER — SODIUM CHLORIDE 0.9% FLUSH: 3.0000 mL | INTRAVENOUS | Status: DC | PRN

## 2015-10-16 MED ORDER — SODIUM CHLORIDE 0.9 % IV SOLN: 250.0000 mL | INTRAVENOUS | Status: DC | PRN

## 2015-10-16 MED ORDER — KETOROLAC TROMETHAMINE 30 MG/ML IJ SOLN
30.0000 mg | Freq: Once | INTRAMUSCULAR | Status: AC
  Administered 2015-10-16: 30 mg via INTRAVENOUS
  Filled 2015-10-16: qty 1

## 2015-10-16 MED ORDER — HYDROMORPHONE HCL 1 MG/ML IJ SOLN
2.0000 mg | Freq: Once | INTRAMUSCULAR | Status: AC
  Administered 2015-10-16: 2 mg via INTRAVENOUS
  Filled 2015-10-16: qty 2

## 2015-10-16 MED ORDER — FOLIC ACID 1 MG PO TABS
1.0000 mg | ORAL_TABLET | Freq: Every day | ORAL | Status: DC
  Administered 2015-10-16 – 2015-10-20 (×5): 1 mg via ORAL
  Filled 2015-10-16 (×5): qty 1

## 2015-10-16 MED ORDER — HYDROMORPHONE 1 MG/ML IV SOLN
INTRAVENOUS | Status: DC
  Administered 2015-10-16: 19:00:00 via INTRAVENOUS
  Administered 2015-10-17: 13.2 mg via INTRAVENOUS
  Administered 2015-10-17: 10.8 mg via INTRAVENOUS
  Filled 2015-10-16 (×2): qty 25

## 2015-10-16 MED ORDER — SODIUM CHLORIDE 0.9 % IV SOLN
INTRAVENOUS | Status: DC
  Administered 2015-10-16: 11:00:00 via INTRAVENOUS

## 2015-10-16 MED ORDER — DEXTROSE-NACL 5-0.45 % IV SOLN: INTRAVENOUS | Status: DC

## 2015-10-16 MED ORDER — PROMETHAZINE HCL 25 MG/ML IJ SOLN
12.5000 mg | Freq: Once | INTRAMUSCULAR | Status: AC
  Administered 2015-10-16: 12.5 mg via INTRAVENOUS
  Filled 2015-10-16: qty 1

## 2015-10-16 MED ORDER — DEXTROSE-NACL 5-0.45 % IV SOLN
INTRAVENOUS | Status: DC
  Administered 2015-10-17 – 2015-10-19 (×4): via INTRAVENOUS

## 2015-10-16 MED ORDER — PROMETHAZINE HCL 25 MG PO TABS
25.0000 mg | ORAL_TABLET | Freq: Four times a day (QID) | ORAL | Status: DC | PRN
  Administered 2015-10-16 – 2015-10-17 (×2): 25 mg via ORAL
  Filled 2015-10-16 (×2): qty 1

## 2015-10-16 MED ORDER — SODIUM CHLORIDE 0.9% FLUSH: 9.0000 mL | INTRAVENOUS | Status: DC | PRN

## 2015-10-16 MED ORDER — ENOXAPARIN SODIUM 40 MG/0.4ML ~~LOC~~ SOLN
40.0000 mg | SUBCUTANEOUS | Status: DC
  Administered 2015-10-16: 40 mg via SUBCUTANEOUS
  Filled 2015-10-16: qty 0.4

## 2015-10-16 MED ORDER — HYDROMORPHONE HCL 1 MG/ML IJ SOLN: 2.0000 mg | Freq: Once | INTRAMUSCULAR | Status: DC

## 2015-10-16 MED ORDER — DIPHENHYDRAMINE HCL 50 MG/ML IJ SOLN
25.0000 mg | Freq: Once | INTRAMUSCULAR | Status: AC
  Administered 2015-10-16: 25 mg via INTRAVENOUS
  Filled 2015-10-16: qty 1

## 2015-10-16 MED ORDER — MORPHINE SULFATE ER 30 MG PO TBCR
60.0000 mg | EXTENDED_RELEASE_TABLET | Freq: Two times a day (BID) | ORAL | Status: DC
  Administered 2015-10-16 – 2015-10-20 (×8): 60 mg via ORAL
  Filled 2015-10-16 (×8): qty 2

## 2015-10-16 MED ORDER — DEXTROSE-NACL 5-0.45 % IV SOLN
INTRAVENOUS | Status: AC
  Administered 2015-10-16: 15:00:00 via INTRAVENOUS

## 2015-10-16 MED ORDER — CYCLOBENZAPRINE HCL 10 MG PO TABS
10.0000 mg | ORAL_TABLET | Freq: Every day | ORAL | Status: DC
  Administered 2015-10-16 – 2015-10-19 (×4): 10 mg via ORAL
  Filled 2015-10-16 (×4): qty 1

## 2015-10-16 MED ORDER — POLYETHYLENE GLYCOL 3350 17 G PO PACK: 17.0000 g | PACK | Freq: Every day | ORAL | Status: DC | PRN

## 2015-10-16 MED ORDER — SODIUM CHLORIDE 0.9 % IV SOLN
25.0000 mg | INTRAVENOUS | Status: DC | PRN
  Filled 2015-10-16: qty 0.5

## 2015-10-16 NOTE — H&P (Signed)
Hospital Admission Note Date: 10/16/2015  Patient name: [name] Medical record number: [id number] Date of birth: [birth year] Age: 40 y.o. Gender: male PCP: Dorrene German, MD  Attending physician: Altha Harm, MD  Chief Complaint: Pain in right side and BUE's x 4 days.  History of Present Illness:[name] is an opiate tolerant well known to me from previous care who is infrequently admitted for crisis. He reports that pain started 4 days ago and he tried to treat with home medications but pain continued to progress and medications became ineffective. Pt takes his opiate medications only when he is having pain and normally takes short acting Morphine Sulfate and only when ineffective does he take MS Contin. He has been taking medications for the last 2 week sand prior to that he has not taken pain meds for about 1 month.   He rates the pain as 8-9/10 down from 10/10 upon arrival to the ED. The4 pain is described as shooting pain and intermittently throbbing. The pain is non-radiating and is accmpanied by nimbness of fingers when he has the shooting pain. He denies any weakness, vomiting or diarrhea but does have nausea that responds to Phenergan. He denies any fevers, chills, headaches or focal neurological deficits.   In the ED he received a NS bolus of 1L of IVF, 5 doses of Dilaudid in intervals of 1-11/2 hrs and 1 dose of Toradol. His pain is still at intensity of 8/10 and I am asked to admit him for sickle cell crisis.  Scheduled Meds:  Continuous Infusions: . sodium chloride Stopped (10/16/15 1517)  . dextrose 5 % and 0.45% NaCl    . dextrose 5 % and 0.45% NaCl 100 mL/hr at 10/16/15 1517  . dextrose 5 % and 0.45% NaCl     PRN Meds:. Allergies: Review of patient's allergies indicates no known allergies. Past Medical History  Diagnosis Date  . Sickle cell crisis (HCC)   . Blood transfusion   . Avascular necrosis of hip (HCC)     bilateral  . Infection of bone, shoulder  region (HCC)     left shoulder  . Pneumonia   . Avascular necrosis of hip, left (HCC) 08/27/2011   Past Surgical History  Procedure Laterality Date  . Orif right hip fracture  1995  . Joint replacement  2006    right total hip arthroplasty  . Bone graft hip iliac crest     Family History  Problem Relation Age of Onset  . Adopted: Yes   Social History   Social History  . Marital Status: Single    Spouse Name: N/A  . Number of Children: N/A  . Years of Education: N/A   Occupational History  . Not on file.   Social History Main Topics  . Smoking status: Current Some Day Smoker -- 0.50 packs/day    Types: Cigarettes    Last Attempt to Quit: 01/27/2012  . Smokeless tobacco: Never Used  . Alcohol Use: No  . Drug Use: No  . Sexual Activity: No   Other Topics Concern  . Not on file   Social History Narrative   On disability.  No assist devices.  No home health services.  Lives with his father.     Review of Systems: A comprehensive review of systems was negative except as noted in the HPI.   Physical Exam:  Intake/Output Summary (Last 24 hours) at 10/16/15 1532 Last data filed at 10/16/15 1516  Gross per 24 hour  Intake  1600 ml  Output    150 ml  Net   1450 ml   General: Alert, awake, oriented x3, in moderate distress secondary to pain.  HEENT: [place]/AT PEERL, EOMI, anicteric Neck: Trachea midline,  no masses, no thyromegal,y no JVD, no carotid bruit OROPHARYNX:  Moist, No exudate/ erythema/lesions.  Heart: Regular rate and rhythm, without murmurs, rubs, gallops, PMI non-displaced, no heaves or thrills on palpation.  Lungs: Clear to auscultation, no wheezing or rhonchi noted. No increased vocal fremitus resonant to percussion  Abdomen: Soft, nontender, nondistended, positive bowel sounds, no masses no hepatosplenomegaly noted.  Neuro: No focal neurological deficits noted cranial nerves II through XII grossly intact.  Strength at functional baseline in bilateral upper  and lower extremities. Musculoskeletal: No warm swelling or erythema around joints, no spinal tenderness noted. Psychiatric: Patient alert and oriented x3, good insight and cognition, good recent to remote recall. .  Lab results:  Recent Labs  10/16/15 1015  NA 138  K 3.8  CL 107  CO2 23  GLUCOSE 85  BUN 12  CREATININE 0.72  CALCIUM 9.0    Recent Labs  10/16/15 1015  AST 22  ALT 12*  ALKPHOS 76  BILITOT 2.6*  PROT 8.3*  ALBUMIN 4.6   No results for input(s): LIPASE, AMYLASE in the last 72 hours.  Recent Labs  10/16/15 1015  WBC 9.7  NEUTROABS 5.3  HGB 10.9*  HCT 29.6*  MCV 89.4  PLT 316   No results for input(s): CKTOTAL, CKMB, CKMBINDEX, TROPONINI in the last 72 hours. Invalid input(s): POCBNP No results for input(s): DDIMER in the last 72 hours. No results for input(s): HGBA1C in the last 72 hours. No results for input(s): CHOL, HDL, LDLCALC, TRIG, CHOLHDL, LDLDIRECT in the last 72 hours. No results for input(s): TSH, T4TOTAL, T3FREE, THYROIDAB in the last 72 hours.  Invalid input(s): FREET3  Recent Labs  10/16/15 1015  RETICCTPCT 2.5   Imaging results:  Dg Chest 2 View  10/16/2015  CLINICAL DATA:  Sickle cell crisis, pain RIGHT chest and RIGHT arm EXAM: CHEST  2 VIEW COMPARISON:  08/13/2015 FINDINGS: Upper normal heart size. Normal mediastinal contours and pulmonary vascularity. Scarring in mid the lower RIGHT lung again seen. No acute infiltrate, pleural effusion or pneumothorax. Bone infarct LEFT humeral head. IMPRESSION: RIGHT lung scarring. No acute abnormalities. Electronically Signed   By: Ulyses SouthwardMark  Boles M.D.   On: 10/16/2015 10:36    Assessment and Plan: 1. Hb [place] with crisis: Plan is for Dilaudid via PCA, Toradol, hypotonic IVF and Dilaudid via Clinician assisted doses on a PRN basis. Check Hb electrophoresis and CRP.  2. Mild Dehydration: Pt has received a NS bolus of 1L of IVF. Will continue infusion with hypotonic fluids to prevent further  polymerization. 3. Anemia of chronic disease: Hb at baseline.  Time spent 45 minutes. Greater than 50% of time spent in assessment, counseling and coordination of care.  Yumiko Alkins A. 10/16/2015, 3:32 PM

## 2015-10-16 NOTE — ED Notes (Signed)
Aware of need for urine sample 

## 2015-10-16 NOTE — ED Notes (Signed)
Pt reports sickle cell pain in R arm and side. Location typical for crisis.

## 2015-10-16 NOTE — ED Notes (Signed)
MD at bedside. 

## 2015-10-16 NOTE — ED Provider Notes (Signed)
CSN: [id number]     Arrival date & time 10/16/15  8657 History   First MD Initiated Contact with Patient 10/16/15 [phone]     Chief Complaint  Patient presents with  . Sickle Cell Pain Crisis     (Consider location/radiation/quality/duration/timing/severity/associated sxs/prior Treatment) Patient is a 40 y.o. male presenting with sickle cell pain. The history is provided by the patient.  Sickle Cell Pain Crisis Associated symptoms: chest pain   Associated symptoms: no congestion, no fever, no headaches, no nausea, no shortness of breath and no vomiting   Patient with known history of sickle cell disease. Presents with the complaint of 3-4 days of right sided chest pain and right-sided body pain typical for his sickle cell crisis. Patient denies any fever or shortness of breath or cough.  Past Medical History  Diagnosis Date  . Sickle cell crisis (HCC)   . Blood transfusion   . Avascular necrosis of hip (HCC)     bilateral  . Infection of bone, shoulder region (HCC)     left shoulder  . Pneumonia   . Avascular necrosis of hip, left (HCC) 08/27/2011   Past Surgical History  Procedure Laterality Date  . Orif right hip fracture  1995  . Joint replacement  2006    right total hip arthroplasty  . Bone graft hip iliac crest     Family History  Problem Relation Age of Onset  . Adopted: Yes   Social History  Substance Use Topics  . Smoking status: Current Some Day Smoker -- 0.50 packs/day    Types: Cigarettes    Last Attempt to Quit: 01/27/2012  . Smokeless tobacco: Never Used  . Alcohol Use: No    Review of Systems  Constitutional: Negative for fever.  HENT: Negative for congestion.   Eyes: Negative for visual disturbance.  Respiratory: Negative for shortness of breath.   Cardiovascular: Positive for chest pain.  Gastrointestinal: Negative for nausea, vomiting and abdominal pain.  Genitourinary: Negative for hematuria.  Musculoskeletal: Positive for myalgias.  Skin:  Negative for rash.  Neurological: Negative for headaches.  Hematological: Does not bruise/bleed easily.  Psychiatric/Behavioral: Negative for confusion.      Allergies  Review of patient's allergies indicates no known allergies.  Home Medications   Prior to Admission medications   Medication Sig Start Date End Date Taking? Authorizing Provider  cyclobenzaprine (FLEXERIL) 10 MG tablet Take 10 mg by mouth at bedtime.   Yes Historical Provider, MD  folic acid (FOLVITE) 1 MG tablet Take 1 mg by mouth daily.   Yes Historical Provider, MD  ibuprofen (ADVIL,MOTRIN) 800 MG tablet Take 800 mg by mouth 3 (three) times daily as needed (PAIN).   Yes Historical Provider, MD  morphine (MS CONTIN) 60 MG 12 hr tablet Take 1 tablet (60 mg total) by mouth 2 (two) times daily. Patient taking differently: Take 60 mg by mouth 2 (two) times daily as needed for pain.  11/20/11  Yes Grayce Sessions, NP  morphine (MSIR) 30 MG tablet Take 30 mg by mouth 2 (two) times daily as needed for severe pain (pain).    Yes Historical Provider, MD  promethazine (PHENERGAN) 25 MG tablet Take 25 mg by mouth every 6 (six) hours as needed for nausea (nausea).    Yes Historical Provider, MD   BP 116/76 mmHg  Pulse 62  Temp(Src) 97.9 F (36.6 C) (Oral)  Resp 14  Ht  (1.88 m)  Wt 79.379 kg  BMI 22.46 kg/m2  SpO2 94%  Physical Exam  Constitutional: He is oriented to person, place, and time. He appears well-developed and well-nourished. No distress.  HENT:  Head: Normocephalic and atraumatic.  Eyes: Conjunctivae and EOM are normal. Pupils are equal, round, and reactive to light.  Neck: Normal range of motion. Neck supple.  Cardiovascular: Normal rate, regular rhythm and normal heart sounds.   Pulmonary/Chest: Effort normal and breath sounds normal.  Abdominal: Soft. Bowel sounds are normal. There is no tenderness.  Musculoskeletal: Normal range of motion.  Neurological: He is alert and oriented to person, place,  and time. No cranial nerve deficit. He exhibits normal muscle tone. Coordination normal.  Skin: Skin is warm.  Nursing note and vitals reviewed.   ED Course  Procedures (including critical care time) Labs Review Labs Reviewed  COMPREHENSIVE METABOLIC PANEL - Abnormal; Notable for the following:    Total Protein 8.3 (*)    ALT 12 (*)    Total Bilirubin 2.6 (*)    All other components within normal limits  CBC WITH DIFFERENTIAL/PLATELET - Abnormal; Notable for the following:    RBC 3.31 (*)    Hemoglobin 10.9 (*)    HCT 29.6 (*)    MCHC 36.8 (*)    Monocytes Absolute 1.2 (*)    All other components within normal limits  RETICULOCYTES - Abnormal; Notable for the following:    RBC. 3.31 (*)    All other components within normal limits  URINALYSIS, ROUTINE W REFLEX MICROSCOPIC (NOT AT [facility])   Results for orders placed or performed during the hospital encounter of 10/16/15  Comprehensive metabolic panel  Result Value Ref Range   Sodium 138 135 - 145 mmol/L   Potassium 3.8 3.5 - 5.1 mmol/L   Chloride 107 101 - 111 mmol/L   CO2 23 22 - 32 mmol/L   Glucose, Bld 85 65 - 99 mg/dL   BUN 12 6 - 20 mg/dL   Creatinine, Ser 1.61 0.61 - 1.24 mg/dL   Calcium 9.0 8.9 - 09.6 mg/dL   Total Protein 8.3 (H) 6.5 - 8.1 g/dL   Albumin 4.6 3.5 - 5.0 g/dL   AST 22 15 - 41 U/L   ALT 12 (L) 17 - 63 U/L   Alkaline Phosphatase 76 38 - 126 U/L   Total Bilirubin 2.6 (H) 0.3 - 1.2 mg/dL   GFR calc non Af Amer >60 >60 mL/min   GFR calc Af Amer >60 >60 mL/min   Anion gap 8 5 - 15  CBC with Differential/Platelet  Result Value Ref Range   WBC 9.7 4.0 - 10.5 K/uL   RBC 3.31 (L) 4.22 - 5.81 MIL/uL   Hemoglobin 10.9 (L) 13.0 - 17.0 g/dL   HCT 04.5 (L) 40.9 - 81.1 %   MCV 89.4 78.0 - 100.0 fL   MCH 32.9 26.0 - 34.0 pg   MCHC 36.8 (H) 30.0 - 36.0 g/dL   RDW 91.4 78.2 - 95.6 %   Platelets 316 150 - 400 K/uL   Neutrophils Relative % 55 %   Lymphocytes Relative 26 %   Monocytes Relative 12 %    Eosinophils Relative 6 %   Basophils Relative 1 %   Neutro Abs 5.3 1.7 - 7.7 K/uL   Lymphs Abs 2.5 0.7 - 4.0 K/uL   Monocytes Absolute 1.2 (H) 0.1 - 1.0 K/uL   Eosinophils Absolute 0.6 0.0 - 0.7 K/uL   Basophils Absolute 0.1 0.0 - 0.1 K/uL   RBC Morphology POLYCHROMASIA PRESENT   Urinalysis, Routine w reflex  microscopic ([facility])  Result Value Ref Range   Color, Urine YELLOW YELLOW   APPearance CLEAR CLEAR   Specific Gravity, Urine 1.008 1.005 - 1.030   pH 6.0 5.0 - 8.0   Glucose, UA NEGATIVE NEGATIVE mg/dL   Hgb urine dipstick NEGATIVE NEGATIVE   Bilirubin Urine NEGATIVE NEGATIVE   Ketones, ur NEGATIVE NEGATIVE mg/dL   Protein, ur NEGATIVE NEGATIVE mg/dL   Nitrite NEGATIVE NEGATIVE   Leukocytes, UA NEGATIVE NEGATIVE  Reticulocytes  Result Value Ref Range   Retic Ct Pct 2.5 0.4 - 3.1 %   RBC. 3.31 (L) 4.22 - 5.81 MIL/uL   Retic Count, Manual 82.8 19.0 - 186.0 K/uL     Imaging Review Dg Chest 2 View  10/16/2015  CLINICAL DATA:  Sickle cell crisis, pain RIGHT chest and RIGHT arm EXAM: CHEST  2 VIEW COMPARISON:  08/13/2015 FINDINGS: Upper normal heart size. Normal mediastinal contours and pulmonary vascularity. Scarring in mid the lower RIGHT lung again seen. No acute infiltrate, pleural effusion or pneumothorax. Bone infarct LEFT humeral head. IMPRESSION: RIGHT lung scarring. No acute abnormalities. Electronically Signed   By: Ulyses SouthwardMark  Boles M.D.   On: 10/16/2015 10:36   I have personally reviewed and evaluated these images and lab results as part of my medical decision-making.   EKG Interpretation None      MDM   Final diagnoses:  Sickle cell disease with crisis [facility](HCC)   Patient with persistent right-sided chest pain patient with known sickle cell disease. Also has pain on the right side of his body. The present for 3-4 days. Patient feels that he needs admission. Patient received 6 mg of hydromorphone here without any improvement. Labs without signal definite  abnormalities reticulocyte count is not elevated normal white count hemoglobin reasonable at 10.9 but may represents hemoconcentration. Electrolytes without significant abnormalities. Renal function normal. We'll discuss with hospitalist team about admission. In the past of similar presentations have resulted in admission. Last admission was 07/14/2015.    Vanetta MuldersScott Latonga Ponder, MD 10/16/15 [phone]

## 2015-10-16 NOTE — ED Notes (Signed)
Ginger ale given

## 2015-10-16 NOTE — ED Notes (Signed)
Encouraged to give urine sample.

## 2015-10-16 NOTE — ED Notes (Signed)
Patient transported to X-ray 

## 2015-10-16 NOTE — ED Notes (Signed)
Pt was provided a urinal and is aware we need a urine sample. Pt will notify when he is able to void.

## 2015-10-16 NOTE — ED Notes (Signed)
Pt was asked again to void. Pt stated he didn't have to go, Provided Ginger Ale

## 2015-10-16 NOTE — ED Notes (Signed)
MD at bedside. ADMITTING MD PRESENT 

## 2015-10-17 DIAGNOSIS — K5900 Constipation, unspecified: Secondary | ICD-10-CM

## 2015-10-17 MED ORDER — HYDROMORPHONE HCL 1 MG/ML IJ SOLN
1.0000 mg | INTRAMUSCULAR | Status: AC
  Administered 2015-10-17 (×3): 1 mg via INTRAVENOUS
  Filled 2015-10-17 (×3): qty 1

## 2015-10-17 MED ORDER — DEXTROSE-NACL 5-0.45 % IV SOLN: INTRAVENOUS | Status: DC

## 2015-10-17 MED ORDER — LACTULOSE 10 GM/15ML PO SOLN
20.0000 g | Freq: Every day | ORAL | Status: DC | PRN
  Administered 2015-10-17 – 2015-10-19 (×3): 20 g via ORAL
  Filled 2015-10-17 (×3): qty 30

## 2015-10-17 MED ORDER — PROMETHAZINE HCL 25 MG PO TABS
25.0000 mg | ORAL_TABLET | ORAL | Status: DC | PRN
  Administered 2015-10-17 – 2015-10-19 (×4): 25 mg via ORAL
  Filled 2015-10-17 (×5): qty 1

## 2015-10-17 MED ORDER — ONDANSETRON HCL 4 MG/2ML IJ SOLN
4.0000 mg | Freq: Once | INTRAMUSCULAR | Status: AC
  Administered 2015-10-17: 4 mg via INTRAVENOUS
  Filled 2015-10-17: qty 2

## 2015-10-17 MED ORDER — HYDROMORPHONE 1 MG/ML IV SOLN
INTRAVENOUS | Status: DC
  Administered 2015-10-17: 4.9 mg via INTRAVENOUS
  Administered 2015-10-17: 4.2 mg via INTRAVENOUS
  Administered 2015-10-17: 7.5 mg via INTRAVENOUS
  Administered 2015-10-17: 7.7 mg via INTRAVENOUS
  Administered 2015-10-18: 6.3 mg via INTRAVENOUS
  Administered 2015-10-18: 11.2 mg via INTRAVENOUS
  Administered 2015-10-18: 0.7 mg via INTRAVENOUS
  Administered 2015-10-18: 2.1 mg via INTRAVENOUS
  Administered 2015-10-18: 4.9 mg via INTRAVENOUS
  Administered 2015-10-19: 9.1 mg via INTRAVENOUS
  Administered 2015-10-19: 4.2 mg via INTRAVENOUS
  Administered 2015-10-19: 1.4 mg via INTRAVENOUS
  Administered 2015-10-19: 8.4 mg via INTRAVENOUS
  Administered 2015-10-19: 9.1 mg via INTRAVENOUS
  Administered 2015-10-19: 6.3 mg via INTRAVENOUS
  Administered 2015-10-19 – 2015-10-20 (×2): 9.8 mg via INTRAVENOUS
  Administered 2015-10-20: 4.2 mg via INTRAVENOUS
  Administered 2015-10-20: 2.1 mg via INTRAVENOUS
  Filled 2015-10-17 (×4): qty 25

## 2015-10-17 MED ORDER — HYDROMORPHONE HCL 1 MG/ML IJ SOLN: 1.0000 mg | INTRAMUSCULAR | Status: DC | PRN

## 2015-10-17 MED ORDER — PROMETHAZINE HCL 25 MG/ML IJ SOLN
12.5000 mg | Freq: Once | INTRAMUSCULAR | Status: AC
  Administered 2015-10-17: 12.5 mg via INTRAVENOUS
  Filled 2015-10-17: qty 1

## 2015-10-17 NOTE — Care Management Note (Signed)
Case Management Note  Patient Details  Name: [name] E Heikes MRN: [id number] Date of Birth: April 02, 1976  Subjective/Objective:         40 [facility]          Action/Plan: From home with father. Chart reviewed and CM following for DC needs.  Expected Discharge Date:   (UNKNOWN)               Expected Discharge Plan:  Home/Self Care  In-House Referral:     Discharge planning Services  CM Consult  Post Acute Care Choice:    Choice offered to:     DME Arranged:    DME Agency:     HH Arranged:    HH Agency:     Status of Service:  In process, will continue to follow  If discussed at Long Length of Stay Meetings, dates discussed:    Additional Comments:  Bartholome BillCLEMENTS, Anastyn Ayars H, RN 10/17/2015, 1:20 PM

## 2015-10-17 NOTE — Progress Notes (Signed)
SICKLE CELL SERVICE PROGRESS NOTE  [name] [id number] DOB: 1975-11-22 DOA: 10/16/2015 PCP: Dorrene GermanEdwin A Avbuere, MD  Assessment/Plan: Active Problems:   Sickle cell anemia with pain (HCC)   Hb-S/hb-C disease with crisis (HCC)  1. Hb [place] with crisis:Pt still having pain at intensity of 8/10. Will increase PCA to 0.7 mg bolus dose. Also add clinician assisted doses and continue Toradol.  Continue  IV fluids at current rate as patient having nausea and decreased oral intake. 2. Anemia of chronic disease: Hemoglobin currently at baseline. 3. Chronic pain: Continue MS Contin 4. Constipation: Patient has MiraLAX ordered but requesting lactulose instead.   Code Status: Full Code Family Communication: N/A Disposition Plan: Not yet ready for discharge  [name] A.  Pager [phone]. If 7PM-7AM, please contact night-coverage.  10/17/2015, 10:43 AM  LOS: 1 day   Interim History: Patient reports pain currently at 7 out of 10 and localized his ribs. He is used 28.8 mg with 54/48: Demands/deliveries in the last 24 hours on the PCA. He's not had a bowel movement since admission.  Consultants:  None  Procedures:  None  Antibiotics:  None   Objective: Filed Vitals:   10/17/15 0514 10/17/15 0628 10/17/15 0810 10/17/15 1007  BP: 122/71   123/73  Pulse: 72   65  Temp: 98.3 F (36.8 C)   97.6 F (36.4 C)  TempSrc: Oral   Oral  Resp: 13 11 14 16   Height:      Weight: 177 lb 11.1 oz (80.6 kg)     SpO2: 91% 92% 96% 98%   Weight change:   Intake/Output Summary (Last 24 hours) at 10/17/15 1043 Last data filed at 10/17/15 0516  Gross per 24 hour  Intake   2600 ml  Output   1800 ml  Net    800 ml    General: Alert, awake, oriented x3, in moderate distress due to pain.  HEENT: [place]/AT PEERL, EOMI, anicteric Neck: Trachea midline,  no masses, no thyromegal,y no JVD, no carotid bruit OROPHARYNX:  Moist, No exudate/ erythema/lesions.  Heart: Regular rate and rhythm,  without murmurs, rubs, gallops, PMI non-displaced, no heaves or thrills on palpation.  Lungs: Clear to auscultation, no wheezing or rhonchi noted. No increased vocal fremitus resonant to percussion  Abdomen: Soft, nontender, nondistended, positive bowel sounds, no masses no hepatosplenomegaly noted.  Neuro: No focal neurological deficits noted cranial nerves II through XII grossly intact. Strength at functional baseline in bilateral upper and lower extremities. Musculoskeletal: No warmth swelling or erythema around joints, no spinal tenderness noted. Psychiatric: Patient alert and oriented x3, good insight and cognition, good recent to remote recall.    Data Reviewed: Basic Metabolic Panel:  Recent Labs Lab 10/16/15 1015  NA 138  K 3.8  CL 107  CO2 23  GLUCOSE 85  BUN 12  CREATININE 0.72  CALCIUM 9.0   Liver Function Tests:  Recent Labs Lab 10/16/15 1015  AST 22  ALT 12*  ALKPHOS 76  BILITOT 2.6*  PROT 8.3*  ALBUMIN 4.6   No results for input(s): LIPASE, AMYLASE in the last 168 hours. No results for input(s): AMMONIA in the last 168 hours. CBC:  Recent Labs Lab 10/16/15 1015  WBC 9.7  NEUTROABS 5.3  HGB 10.9*  HCT 29.6*  MCV 89.4  PLT 316   Cardiac Enzymes: No results for input(s): CKTOTAL, CKMB, CKMBINDEX, TROPONINI in the last 168 hours. BNP (last 3 results) No results for input(s): BNP in the last 8760 hours.  ProBNP (last 3 results) No results for input(s): PROBNP in the last 8760 hours.  CBG: No results for input(s): GLUCAP in the last 168 hours.  No results found for this or any previous visit (from the past 240 hour(s)).   Studies: Dg Chest 2 View  10/16/2015  CLINICAL DATA:  Sickle cell crisis, pain RIGHT chest and RIGHT arm EXAM: CHEST  2 VIEW COMPARISON:  08/13/2015 FINDINGS: Upper normal heart size. Normal mediastinal contours and pulmonary vascularity. Scarring in mid the lower RIGHT lung again seen. No acute infiltrate, pleural effusion or  pneumothorax. Bone infarct LEFT humeral head. IMPRESSION: RIGHT lung scarring. No acute abnormalities. Electronically Signed   By: Ulyses SouthwardMark  Boles M.D.   On: 10/16/2015 10:36    Scheduled Meds: . cyclobenzaprine  10 mg Oral QHS  . enoxaparin (LOVENOX) injection  40 mg Subcutaneous Q24H  . folic acid  1 mg Oral Daily  . HYDROmorphone   Intravenous Q4H  .  HYDROmorphone (DILAUDID) injection  1 mg Intravenous Q2H  . ketorolac  30 mg Intravenous Q6H  . morphine  60 mg Oral BID  . senna-docusate  1 tablet Oral BID  . sodium chloride flush  3 mL Intravenous Q12H   Continuous Infusions: . dextrose 5 % and 0.45% NaCl 100 mL/hr at 10/17/15 0028    Time spent 25 minutes

## 2015-10-18 LAB — BASIC METABOLIC PANEL
Anion gap: 8 (ref 5–15)
BUN: 6 mg/dL (ref 6–20)
CALCIUM: 9 mg/dL (ref 8.9–10.3)
CO2: 23 mmol/L (ref 22–32)
CREATININE: 0.61 mg/dL (ref 0.61–1.24)
Chloride: 106 mmol/L (ref 101–111)
GFR calc non Af Amer: >60 mL/min (ref >60)
Glucose, Bld: 76 mg/dL (ref 65–99)
Potassium: 5.4 mmol/L — ABNORMAL HIGH (ref 3.5–5.1)
SODIUM: 137 mmol/L (ref 135–145)

## 2015-10-18 NOTE — Progress Notes (Signed)
SICKLE CELL SERVICE PROGRESS NOTE  [name] [id number] DOB: May 17, 1975 DOA: 10/16/2015 PCP: Dorrene GermanEdwin A Avbuere, MD  Assessment/Plan: Active Problems:   Sickle cell anemia with pain (HCC)   Hb-S/hb-C disease with crisis (HCC)  1. Hb [place] with crisis:Pain at intensity of 7/10. Continue PCA at 0.7 mg bolus dose. Continue clinician assisted doses and continue Toradol.  Continue  IV fluids at current rate as patient having nausea and decreased oral intake. 2. Anemia of chronic disease: Hemoglobin currently at baseline. 3. Chronic pain: Continue MS Contin 4. Constipation: Patient has lactulose ordered but still has not had a BM.   Code Status: Full Code Family Communication: N/A Disposition Plan: Not yet ready for discharge  Andreya Lacks A.  Pager [phone]. If 7PM-7AM, please contact night-coverage.  10/18/2015, 12:29 PM  LOS: 2 days   Interim History: Patient reports pain currently at /10 and localized his right sided ribs, right shoulder and right hip. He has used31.7 mg with 63/46: Demands/deliveries in the last 24 hours on the PCA. He's not had a bowel movement since admission.  Consultants:  None  Procedures:  None  Antibiotics:  None   Objective: Filed Vitals:   10/18/15 0609 10/18/15 0744 10/18/15 0946 10/18/15 1138  BP:   131/92   Pulse:   75   Temp:   97.5 F (36.4 C)   TempSrc:   Oral   Resp: 12 11 14 19   Height:      Weight:      SpO2: 93% 99% 98% 96%   Weight change: 0 lb (0 kg)  Intake/Output Summary (Last 24 hours) at 10/18/15 1229 Last data filed at 10/18/15 0958  Gross per 24 hour  Intake    360 ml  Output   3150 ml  Net  -2790 ml    General: Alert, awake, oriented x3, in mild distress due to pain.  HEENT: [place]/AT PEERL, EOMI, anicteric Neck: Trachea midline,  no masses, no thyromegal,y no JVD, no carotid bruit OROPHARYNX:  Moist, No exudate/ erythema/lesions.  Heart: Regular rate and rhythm, without murmurs, rubs, gallops, PMI  non-displaced, no heaves or thrills on palpation.  Lungs: Clear to auscultation, no wheezing or rhonchi noted. No increased vocal fremitus resonant to percussion  Abdomen: Soft, nontender, nondistended, positive bowel sounds, no masses no hepatosplenomegaly noted.  Neuro: No focal neurological deficits noted cranial nerves II through XII grossly intact. Strength at functional baseline in bilateral upper and lower extremities. Musculoskeletal: No warmth swelling or erythema around joints, no spinal tenderness noted. Psychiatric: Patient alert and oriented x3, good insight and cognition, good recent to remote recall.    Data Reviewed: Basic Metabolic Panel:  Recent Labs Lab 10/16/15 1015 10/18/15 0336  NA 138 137  K 3.8 5.4*  CL 107 106  CO2 23 23  GLUCOSE 85 76  BUN 12 6  CREATININE 0.72 0.61  CALCIUM 9.0 9.0   Liver Function Tests:  Recent Labs Lab 10/16/15 1015  AST 22  ALT 12*  ALKPHOS 76  BILITOT 2.6*  PROT 8.3*  ALBUMIN 4.6   No results for input(s): LIPASE, AMYLASE in the last 168 hours. No results for input(s): AMMONIA in the last 168 hours. CBC:  Recent Labs Lab 10/16/15 1015  WBC 9.7  NEUTROABS 5.3  HGB 10.9*  HCT 29.6*  MCV 89.4  PLT 316   Cardiac Enzymes: No results for input(s): CKTOTAL, CKMB, CKMBINDEX, TROPONINI in the last 168 hours. BNP (last 3 results) No results for input(s): BNP in  the last 8760 hours.  ProBNP (last 3 results) No results for input(s): PROBNP in the last 8760 hours.  CBG: No results for input(s): GLUCAP in the last 168 hours.  No results found for this or any previous visit (from the past 240 hour(s)).   Studies: Dg Chest 2 View  10/16/2015  CLINICAL DATA:  Sickle cell crisis, pain RIGHT chest and RIGHT arm EXAM: CHEST  2 VIEW COMPARISON:  08/13/2015 FINDINGS: Upper normal heart size. Normal mediastinal contours and pulmonary vascularity. Scarring in mid the lower RIGHT lung again seen. No acute infiltrate, pleural  effusion or pneumothorax. Bone infarct LEFT humeral head. IMPRESSION: RIGHT lung scarring. No acute abnormalities. Electronically Signed   By: Ulyses SouthwardMark  Boles M.D.   On: 10/16/2015 10:36    Scheduled Meds: . cyclobenzaprine  10 mg Oral QHS  . enoxaparin (LOVENOX) injection  40 mg Subcutaneous Q24H  . folic acid  1 mg Oral Daily  . HYDROmorphone   Intravenous Q4H  . ketorolac  30 mg Intravenous Q6H  . morphine  60 mg Oral BID  . senna-docusate  1 tablet Oral BID  . sodium chloride flush  3 mL Intravenous Q12H   Continuous Infusions: . dextrose 5 % and 0.45% NaCl 100 mL/hr at 10/18/15 0610    Time spent 25 minutes

## 2015-10-19 ENCOUNTER — Inpatient Hospital Stay (HOSPITAL_COMMUNITY): Payer: Medicare Other

## 2015-10-19 DIAGNOSIS — R059 Cough, unspecified: Secondary | ICD-10-CM | POA: Insufficient documentation

## 2015-10-19 DIAGNOSIS — R05 Cough: Secondary | ICD-10-CM

## 2015-10-19 NOTE — Progress Notes (Signed)
Patient ID: [name], male   DOB: [birth year], 40 y.o.   MRN: [id number] Subjective: Patient feels slightly better but still having pains, rated at 7/10, localized on right side_shoulders and hip joints. Had bowel movement yesterday after laxative. No fever, no chest pain, no cough, no SOB.  Objective:  Vital signs in last 24 hours:  Filed Vitals:   10/19/15 0400 10/19/15 0612 10/19/15 0800 10/19/15 1048  BP:  127/89  113/72  Pulse:  89  76  Temp:  98.1 F (36.7 C)  98.6 F (37 C)  TempSrc:  Oral  Oral  Resp: 8 10 12 16   Height:      Weight:  79.334 kg (174 lb 14.4 oz)    SpO2: 91% 92% 93% 93%   Intake/Output from previous day:   Intake/Output Summary (Last 24 hours) at 10/19/15 1119 Last data filed at 10/19/15 [id number]  Gross per 24 hour  Intake      0 ml  Output   3150 ml  Net  -3150 ml    Physical Exam: General: Alert, awake, oriented x3, in no acute distress.  HEENT: [place]/AT PEERL, EOMI Neck: Trachea midline,  no masses, no thyromegal,y no JVD, no carotid bruit OROPHARYNX:  Moist, No exudate/ erythema/lesions.  Heart: Regular rate and rhythm, without murmurs, rubs, gallops, PMI non-displaced, no heaves or thrills on palpation.  Lungs: [place] entry bilaterally, crackles both lung bases posteriorly, most likely from atelectasis (no cough or fever). Abdomen: Soft, nontender, nondistended, positive bowel sounds, no masses no hepatosplenomegaly noted..  Neuro: No focal neurological deficits noted cranial nerves II through XII grossly intact. DTRs 2+ bilaterally upper and lower extremities. Strength 5 out of 5 in bilateral upper and lower extremities. Musculoskeletal: No warm swelling or erythema around joints, no spinal tenderness noted. Psychiatric: Patient alert and oriented x3, good insight and cognition, good recent to remote recall. Lymph node survey: No cervical axillary or inguinal lymphadenopathy noted.  Lab Results:  Basic Metabolic Panel:    Component Value  Date/Time   NA 137 10/18/2015 0336   K 5.4* 10/18/2015 0336   CL 106 10/18/2015 0336   CO2 23 10/18/2015 0336   BUN 6 10/18/2015 0336   CREATININE 0.61 10/18/2015 0336   GLUCOSE 76 10/18/2015 0336   CALCIUM 9.0 10/18/2015 0336   CBC:    Component Value Date/Time   WBC 9.7 10/16/2015 1015   HGB 10.9* 10/16/2015 1015   HCT 29.6* 10/16/2015 1015   PLT 316 10/16/2015 1015   MCV 89.4 10/16/2015 1015   NEUTROABS 5.3 10/16/2015 1015   LYMPHSABS 2.5 10/16/2015 1015   MONOABS 1.2* 10/16/2015 1015   EOSABS 0.6 10/16/2015 1015   BASOSABS 0.1 10/16/2015 1015    No results found for this or any previous visit (from the past 240 hour(s)).  Studies/Results: No results found.  Medications: Scheduled Meds: . cyclobenzaprine  10 mg Oral QHS  . enoxaparin (LOVENOX) injection  40 mg Subcutaneous Q24H  . folic acid  1 mg Oral Daily  . HYDROmorphone   Intravenous Q4H  . ketorolac  30 mg Intravenous Q6H  . morphine  60 mg Oral BID  . senna-docusate  1 tablet Oral BID  . sodium chloride flush  3 mL Intravenous Q12H   Continuous Infusions: . dextrose 5 % and 0.45% NaCl 100 mL/hr at 10/18/15 0610   PRN Meds:.sodium chloride, diphenhydrAMINE **OR** [DISCONTINUED] diphenhydrAMINE (BENADRYL) IVPB(SICKLE CELL ONLY), [COMPLETED]  HYDROmorphone (DILAUDID) injection **FOLLOWED BY** HYDROmorphone (DILAUDID) injection, lactulose, naloxone **AND**  sodium chloride flush, promethazine, sodium chloride flush  Consultants:  None  Procedures:  None  Antibiotics:  None  Assessment/Plan: Active Problems:   Sickle cell anemia with pain (HCC)   Hb-S/hb-C disease with crisis (HCC)   Cough  1. Hb  with crisis: Pain still at intensity of 7/10. No fever. Continue PCA at current dose. Continue clinician assisted doses and continue Toradol. Will decrease IVF rate when patient tolerates PO intake fully. 2. Anemia of chronic disease: Hemoglobin currently at baseline. Continue to monitor 3. Chronic  pain: Continue MS Contin 4. Constipation: Patient has lactulose ordered, had bowel movement yesterday 10/18/2015. 5. Crackles on Lung Exam: Patient had no cough or SOB, no fever. CXR ordered which showed: Ill-defined opacities at each lung base, likely a combination of chronic scarring and mild atelectasis. Favor atelectasis but early developing pneumonia is possible if febrile. No pleural effusion or pneumothorax. Plan: Incentive Spirometry every hour while awake.  Code Status: Full Code Family Communication: N/A Disposition Plan: Possible discharge home tomorrow  Eara Burruel  If 7PM-7AM, please contact night-coverage.  10/19/2015, 11:19 AM  LOS: 3 days

## 2015-10-20 DIAGNOSIS — D5700 Hb-SS disease with crisis, unspecified: Principal | ICD-10-CM

## 2015-10-20 LAB — CBC WITH DIFFERENTIAL/PLATELET
BASOS ABS: 0 10*3/uL (ref 0.0–0.1)
Basophils Relative: 0 %
EOS ABS: 1.3 10*3/uL — AB (ref 0.0–0.7)
Eosinophils Relative: 9 %
HEMATOCRIT: 24.9 % — AB (ref 39.0–52.0)
Hemoglobin: 9.1 g/dL — ABNORMAL LOW (ref 13.0–17.0)
LYMPHS ABS: 2.5 10*3/uL (ref 0.7–4.0)
Lymphocytes Relative: 17 %
MCH: 32.6 pg (ref 26.0–34.0)
MCHC: 36.5 g/dL — ABNORMAL HIGH (ref 30.0–36.0)
MCV: 89.2 fL (ref 78.0–100.0)
MONO ABS: 1.2 10*3/uL — AB (ref 0.1–1.0)
MONOS PCT: 8 %
Neutro Abs: 9.7 10*3/uL — ABNORMAL HIGH (ref 1.7–7.7)
Neutrophils Relative %: 66 %
PLATELETS: 276 10*3/uL (ref 150–400)
RBC: 2.79 MIL/uL — AB (ref 4.22–5.81)
RDW: 15 % (ref 11.5–15.5)
WBC: 14.7 10*3/uL — AB (ref 4.0–10.5)

## 2015-10-20 LAB — BASIC METABOLIC PANEL
ANION GAP: 5 (ref 5–15)
BUN: 9 mg/dL (ref 6–20)
CALCIUM: 9 mg/dL (ref 8.9–10.3)
CO2: 27 mmol/L (ref 22–32)
Chloride: 106 mmol/L (ref 101–111)
Creatinine, Ser: 0.56 mg/dL — ABNORMAL LOW (ref 0.61–1.24)
Glucose, Bld: 101 mg/dL — ABNORMAL HIGH (ref 65–99)
Potassium: 3.6 mmol/L (ref 3.5–5.1)
SODIUM: 138 mmol/L (ref 135–145)

## 2015-10-20 NOTE — Discharge Summary (Signed)
Physician Discharge Summary  Malik PortHoward E [name] RUE:[id number] DOB: [birth year] DOA: 10/16/2015  PCP: Malik GermanEdwin A Avbuere, MD  Admit date: 10/16/2015  Discharge date: 10/20/2015  Discharge Diagnoses:  Active Problems:   Sickle cell anemia with pain (HCC)   Hb-S/hb-C disease with crisis (HCC)   Cough   Discharge Condition: Stable  Disposition:  Follow-up Information    Follow up with Malik GermanEdwin A Avbuere, MD. Schedule an appointment as soon as possible for a visit in 3 days.   Specialty:  Internal Medicine   Why:  For Follow up   Contact information:   [address] [place] [address] [phone]       Follow up with Malik GermanEdwin A Avbuere, MD.   Specialty:  Internal Medicine   Contact information:   [address] [place] [address] [phone]       Diet: Regular  Wt Readings from Last 3 Encounters:  10/19/15 79.334 kg (174 lb 14.4 oz)  08/13/15 79.379 kg (175 lb)  07/19/15 80.151 kg (176 lb 11.2 oz)    History of present illness:  [name] is an opiate tolerant who is infrequently admitted for crisis. He reports that pain started 4 days prior to admission and he tried to treat with home medications but pain continued to progress and medications became ineffective. Pt takes his opiate medications only when he is having pain and normally takes short acting Morphine Sulfate and only when ineffective does he take MS Contin. He has been taking medications for the last 2 weeks and prior to that he has not taken pain meds for about 1 month.  He rated the pain as 8-9/10 down from 10/10 upon arrival to the ED. The pain was described as shooting pain and intermittently throbbing. The pain is non-radiating and is accmpanied by nimbness of fingers when he has the shooting pain. He denies any weakness, vomiting or diarrhea but does have nausea that responds to Phenergan. He denies any fevers, chills, headaches or focal neurological deficits.   In the ED he received a NS bolus of 1L of IVF,  5 doses of Dilaudid in intervals of 1-11/2 hrs and 1 dose of Toradol. His pain is still at intensity of 8/10 and he was admitted for sickle cell crisis.  Hospital Course:  Patient was managed with IV Dilaudid via PCA and clinician assisted doses, IV Toradol and IV Fluid. He developed intense nausea and vomiting with poor oral intake but this resolved with antiemetics and bowel movement. Pain intensity slowly improved from 9 to baseline of 6, patient ambulating well without assistance or concern. He had atelectasis as shown on CXR of 10/19/2015 but no cough, no fever, no SOB. Patient received intensive incentive spirometry with significant improvement in oxygenation up to 100%. Patient will call his PCP's office in the morning [facility]. He will continue his home pain and other medications per PCP. At the time of discharge, patient's vital signs were all within normal limits, no fever, no SOB, pain at baseline, Hb at baseline. He was discharged home in a hemodynamically stable condition.   Discharge Exam: Filed Vitals:   10/20/15 0400 10/20/15 0545  BP:  115/80  Pulse:  76  Temp:  98.7 F (37.1 C)  Resp: 10 13   Filed Vitals:   10/20/15 0000 10/20/15 0149 10/20/15 0400 10/20/15 0545  BP:  138/83  115/80  Pulse:  106  76  Temp:  98.8 F (37.1 C)  98.7 F (37.1 C)  TempSrc:  Oral  Oral  Resp: Height:      Weight:      SpO2: 94% 92% 92% 91%    General appearance : Awake, alert, not in any distress. Speech Clear. Not toxic looking HEENT: Atraumatic and Normocephalic, pupils equally reactive to light and accomodation Neck: Supple, no JVD. No cervical lymphadenopathy.  Chest: Good air entry bilaterally, no added sounds  CVS: S1 S2 regular, no murmurs.  Abdomen: Bowel sounds present, Non tender and not distended with no gaurding, rigidity or rebound. Extremities: B/L Lower Ext shows no edema, both legs are warm to touch Neurology: Awake alert,  and oriented X 3, CN II-XII intact, Non focal Skin: No Rash  Discharge Instructions     Medication List    TAKE these medications        cyclobenzaprine 10 MG tablet  Commonly known as:  FLEXERIL  Take 10 mg by mouth at bedtime.     folic acid 1 MG tablet  Commonly known as:  FOLVITE  Take 1 mg by mouth daily.     ibuprofen 800 MG tablet  Commonly known as:  ADVIL,MOTRIN  Take 800 mg by mouth 3 (three) times daily as needed (PAIN).     morphine 30 MG tablet  Commonly known as:  MSIR  Take 30 mg by mouth 2 (two) times daily as needed for severe pain (pain).     morphine 60 MG 12 hr tablet  Commonly known as:  MS CONTIN  Take 1 tablet (60 mg total) by mouth 2 (two) times daily.     promethazine 25 MG tablet  Commonly known as:  PHENERGAN  Take 25 mg by mouth every 6 (six) hours as needed for nausea (nausea).         The results of significant diagnostics from this hospitalization (including imaging, microbiology, ancillary and laboratory) are listed below for reference.    Significant Diagnostic Studies: Dg Chest 2 View  10/19/2015  CLINICAL DATA:  Right chest wall pain for 2-3 days. History of sickle cell anemia, pneumonia. EXAM: CHEST  2 VIEW COMPARISON:  Chest x-rays dated 10/16/2015, 05/25/2015 and 02/08/2015. FINDINGS: Patchy ill-defined opacities are seen at each lung base, likely a combination of chronic scarring and mild atelectasis. Upper lungs remain clear. Heart size is normal. Overall cardiomediastinal silhouette is stable. Osseous and soft tissue structures about the chest are unremarkable. IMPRESSION: 1. Ill-defined opacities at each lung base, likely a combination of chronic scarring and mild atelectasis. Favor atelectasis but early developing pneumonia is possible if febrile. No pleural effusion or pneumothorax. 2. Heart size is normal. Electronically Signed   By: Bary Richard M.D.   On: 10/19/2015 13:21   Dg Chest 2 View  10/16/2015  CLINICAL DATA:   Sickle cell crisis, pain RIGHT chest and RIGHT arm EXAM: CHEST  2 VIEW COMPARISON:  08/13/2015 FINDINGS: Upper normal heart size. Normal mediastinal contours and pulmonary vascularity. Scarring in mid the lower RIGHT lung again seen. No acute infiltrate, pleural effusion or pneumothorax. Bone infarct LEFT humeral head. IMPRESSION: RIGHT lung scarring. No acute abnormalities. Electronically Signed   By: Ulyses Southward M.D.   On: 10/16/2015 10:36    Microbiology: No results found for this or any previous visit (from the past 240 hour(s)).   Labs: Basic Metabolic Panel:  Recent Labs Lab 10/16/15 1015 10/18/15 0336 10/20/15 0412  NA 138 137 138  K 3.8 5.4* 3.6  CL 107 106 106  CO2  23 23 27   GLUCOSE 85 76 101*  BUN 12 6 9   CREATININE 0.72 0.61 0.56*  CALCIUM 9.0 9.0 9.0   Liver Function Tests:  Recent Labs Lab 10/16/15 1015  AST 22  ALT 12*  ALKPHOS 76  BILITOT 2.6*  PROT 8.3*  ALBUMIN 4.6   No results for input(s): LIPASE, AMYLASE in the last 168 hours. No results for input(s): AMMONIA in the last 168 hours. CBC:  Recent Labs Lab 10/16/15 1015 10/20/15 0412  WBC 9.7 14.7*  NEUTROABS 5.3 9.7*  HGB 10.9* 9.1*  HCT 29.6* 24.9*  MCV 89.4 89.2  PLT 316 276   Cardiac Enzymes: No results for input(s): CKTOTAL, CKMB, CKMBINDEX, TROPONINI in the last 168 hours. BNP: Invalid input(s): POCBNP CBG: No results for input(s): GLUCAP in the last 168 hours.  Time coordinating discharge: 50 minutes  Signed:  Jeanann LewandowskyJEGEDE, Lucero Ide  [facility] 10/20/2015, 8:53 AM

## 2015-10-20 NOTE — Progress Notes (Signed)
Patient discharged to home, all discharge medications and instructions reviewed and questions answered.  Patient to be assisted to vehicle by wheelchair.  

## 2015-10-20 NOTE — Discharge Instructions (Signed)
We discussed the need for good hydration, monitoring of hydration status, avoidance of heat, cold, stress, and infection triggers. We discussed the need to be compliant with taking all home medications and regular follow up with PCP. [name] was reminded of the need to seek medical attention promptly if any symptoms of bleeding, anemia, or infection occurs.

## 2015-12-05 ENCOUNTER — Emergency Department (HOSPITAL_COMMUNITY): Payer: Medicare Other

## 2015-12-05 ENCOUNTER — Inpatient Hospital Stay (HOSPITAL_COMMUNITY)
Admission: EM | Admit: 2015-12-05 | Discharge: 2015-12-10 | DRG: 812 | Disposition: A | Discharge status: Home / Self Care | Payer: Medicare Other | Attending: Internal Medicine | Admitting: Internal Medicine

## 2015-12-05 ENCOUNTER — Encounter (HOSPITAL_COMMUNITY): Payer: Self-pay | Admitting: Emergency Medicine

## 2015-12-05 DIAGNOSIS — D72829 Elevated white blood cell count, unspecified: Secondary | ICD-10-CM | POA: Diagnosis present

## 2015-12-05 DIAGNOSIS — R0781 Pleurodynia: Secondary | ICD-10-CM | POA: Diagnosis not present

## 2015-12-05 DIAGNOSIS — Z96641 Presence of right artificial hip joint: Secondary | ICD-10-CM | POA: Diagnosis present

## 2015-12-05 DIAGNOSIS — Z79899 Other long term (current) drug therapy: Secondary | ICD-10-CM

## 2015-12-05 DIAGNOSIS — D5700 Hb-SS disease with crisis, unspecified: Principal | ICD-10-CM | POA: Diagnosis present

## 2015-12-05 DIAGNOSIS — F17210 Nicotine dependence, cigarettes, uncomplicated: Secondary | ICD-10-CM | POA: Diagnosis present

## 2015-12-05 DIAGNOSIS — K5900 Constipation, unspecified: Secondary | ICD-10-CM | POA: Diagnosis present

## 2015-12-05 LAB — COMPREHENSIVE METABOLIC PANEL
ALK PHOS: 79 U/L (ref 38–126)
ALT: 14 U/L — AB (ref 17–63)
ANION GAP: 7 (ref 5–15)
AST: 25 U/L (ref 15–41)
Albumin: 4.3 g/dL (ref 3.5–5.0)
BILIRUBIN TOTAL: 1.7 mg/dL — AB (ref 0.3–1.2)
BUN: 14 mg/dL (ref 6–20)
CALCIUM: 9.1 mg/dL (ref 8.9–10.3)
CO2: 26 mmol/L (ref 22–32)
CREATININE: 0.81 mg/dL (ref 0.61–1.24)
Chloride: 105 mmol/L (ref 101–111)
GFR calc non Af Amer: >60 mL/min (ref >60)
GLUCOSE: 106 mg/dL — AB (ref 65–99)
Potassium: 3.7 mmol/L (ref 3.5–5.1)
Sodium: 138 mmol/L (ref 135–145)
TOTAL PROTEIN: 7.9 g/dL (ref 6.5–8.1)

## 2015-12-05 LAB — CBC WITH DIFFERENTIAL/PLATELET
BASOS ABS: 0.1 10*3/uL (ref 0.0–0.1)
BASOS PCT: 1 %
EOS ABS: 0.6 10*3/uL (ref 0.0–0.7)
Eosinophils Relative: 4 %
HEMATOCRIT: 29.9 % — AB (ref 39.0–52.0)
Hemoglobin: 11 g/dL — ABNORMAL LOW (ref 13.0–17.0)
Lymphocytes Relative: 24 %
Lymphs Abs: 3.3 10*3/uL (ref 0.7–4.0)
MCH: 33.2 pg (ref 26.0–34.0)
MCHC: 36.8 g/dL — AB (ref 30.0–36.0)
MCV: 90.3 fL (ref 78.0–100.0)
MONO ABS: 1.2 10*3/uL — AB (ref 0.1–1.0)
MONOS PCT: 9 %
NEUTROS ABS: 9 10*3/uL — AB (ref 1.7–7.7)
NEUTROS PCT: 63 %
Platelets: 342 10*3/uL (ref 150–400)
RBC: 3.31 MIL/uL — ABNORMAL LOW (ref 4.22–5.81)
RDW: 14.2 % (ref 11.5–15.5)
WBC: 14.2 10*3/uL — ABNORMAL HIGH (ref 4.0–10.5)

## 2015-12-05 LAB — RETICULOCYTES
RBC.: 3.29 MIL/uL — AB (ref 4.22–5.81)
RETIC COUNT ABSOLUTE: 88.8 10*3/uL (ref 19.0–186.0)
RETIC CT PCT: 2.7 % (ref 0.4–3.1)

## 2015-12-05 LAB — TYPE AND SCREEN
ABO/RH(D): O POS
Antibody Screen: NEGATIVE

## 2015-12-05 MED ORDER — HYDROMORPHONE HCL 2 MG/ML IJ SOLN
0.0250 mg/kg | INTRAMUSCULAR | Status: AC
  Administered 2015-12-05: 1.9 mg via INTRAVENOUS
  Filled 2015-12-05: qty 1

## 2015-12-05 MED ORDER — HYDROMORPHONE 1 MG/ML IV SOLN
INTRAVENOUS | Status: DC
  Administered 2015-12-05: 18:00:00 via INTRAVENOUS
  Administered 2015-12-06: 6.3 mg via INTRAVENOUS
  Administered 2015-12-06: 14.7 mg via INTRAVENOUS
  Administered 2015-12-06: 16.1 mg via INTRAVENOUS
  Administered 2015-12-06: 12.6 mg via INTRAVENOUS
  Administered 2015-12-06: 17:00:00 via INTRAVENOUS
  Administered 2015-12-06: 3.5 mg via INTRAVENOUS
  Administered 2015-12-06: 02:00:00 via INTRAVENOUS
  Administered 2015-12-07: 6.3 mg via INTRAVENOUS
  Administered 2015-12-07: 2.1 mg via INTRAVENOUS
  Administered 2015-12-07: 1.4 mg via INTRAVENOUS
  Administered 2015-12-07: 5.43 mg via INTRAVENOUS
  Administered 2015-12-07: 0.7 mg via INTRAVENOUS
  Administered 2015-12-07: 9.1 mg via INTRAVENOUS
  Administered 2015-12-08: 6.3 mg via INTRAVENOUS
  Administered 2015-12-08: 2.1 mg via INTRAVENOUS
  Administered 2015-12-08: 7.7 mg via INTRAVENOUS
  Administered 2015-12-08: 5.6 mg via INTRAVENOUS
  Administered 2015-12-08: 11.24 mg via INTRAVENOUS
  Administered 2015-12-08: 2.1 mg via INTRAVENOUS
  Administered 2015-12-09: 1.4 mg via INTRAVENOUS
  Administered 2015-12-09: 02:00:00 via INTRAVENOUS
  Administered 2015-12-09: 2.4 mg via INTRAVENOUS
  Administered 2015-12-09: 7.41 mg via INTRAVENOUS
  Administered 2015-12-09: 6.71 mg via INTRAVENOUS
  Administered 2015-12-09: 2.7 mg via INTRAVENOUS
  Administered 2015-12-09: 7 mg via INTRAVENOUS
  Administered 2015-12-09 – 2015-12-10 (×2): 6.3 mg via INTRAVENOUS
  Administered 2015-12-10: 04:00:00 via INTRAVENOUS
  Filled 2015-12-05 (×7): qty 25

## 2015-12-05 MED ORDER — CYCLOBENZAPRINE HCL 10 MG PO TABS
10.0000 mg | ORAL_TABLET | Freq: Every day | ORAL | Status: DC
  Administered 2015-12-05 – 2015-12-09 (×5): 10 mg via ORAL
  Filled 2015-12-05 (×5): qty 1

## 2015-12-05 MED ORDER — HYDROMORPHONE HCL 2 MG/ML IJ SOLN
2.0000 mg | INTRAMUSCULAR | Status: AC | PRN
Start: 2015-12-05 — End: 2015-12-05
  Administered 2015-12-05 (×2): 2 mg via INTRAVENOUS
  Filled 2015-12-05 (×2): qty 1

## 2015-12-05 MED ORDER — HYDROMORPHONE HCL 2 MG/ML IJ SOLN: 0.0250 mg/kg | INTRAMUSCULAR | Status: AC

## 2015-12-05 MED ORDER — KETOROLAC TROMETHAMINE 30 MG/ML IJ SOLN
30.0000 mg | Freq: Four times a day (QID) | INTRAMUSCULAR | Status: AC
  Administered 2015-12-05 – 2015-12-10 (×20): 30 mg via INTRAVENOUS
  Filled 2015-12-05 (×20): qty 1

## 2015-12-05 MED ORDER — FOLIC ACID 1 MG PO TABS
1.0000 mg | ORAL_TABLET | Freq: Every day | ORAL | Status: DC
  Administered 2015-12-06 – 2015-12-10 (×5): 1 mg via ORAL
  Filled 2015-12-05 (×5): qty 1

## 2015-12-05 MED ORDER — NALOXONE HCL 0.4 MG/ML IJ SOLN: 0.4000 mg | INTRAMUSCULAR | Status: DC | PRN

## 2015-12-05 MED ORDER — ONDANSETRON HCL 4 MG/2ML IJ SOLN
4.0000 mg | Freq: Four times a day (QID) | INTRAMUSCULAR | Status: DC | PRN
Start: 2015-12-05 — End: 2015-12-10
  Administered 2015-12-06 – 2015-12-09 (×5): 4 mg via INTRAVENOUS
  Filled 2015-12-05 (×6): qty 2

## 2015-12-05 MED ORDER — DIPHENHYDRAMINE HCL 25 MG PO CAPS
25.0000 mg | ORAL_CAPSULE | ORAL | Status: DC | PRN
  Administered 2015-12-05: 25 mg via ORAL
  Filled 2015-12-05: qty 1

## 2015-12-05 MED ORDER — SODIUM CHLORIDE 0.9 % IV SOLN
25.0000 mg | INTRAVENOUS | Status: DC | PRN
  Filled 2015-12-05: qty 0.5

## 2015-12-05 MED ORDER — ONDANSETRON HCL 4 MG/2ML IJ SOLN
4.0000 mg | INTRAMUSCULAR | Status: DC | PRN
  Administered 2015-12-05: 4 mg via INTRAVENOUS
  Filled 2015-12-05: qty 2

## 2015-12-05 MED ORDER — DIPHENHYDRAMINE HCL 25 MG PO CAPS: 25.0000 mg | ORAL_CAPSULE | ORAL | Status: DC | PRN

## 2015-12-05 MED ORDER — DEXTROSE-NACL 5-0.45 % IV SOLN
INTRAVENOUS | Status: DC
  Administered 2015-12-05: 11:00:00 via INTRAVENOUS

## 2015-12-05 MED ORDER — SODIUM CHLORIDE 0.9% FLUSH: 9.0000 mL | INTRAVENOUS | Status: DC | PRN

## 2015-12-05 MED ORDER — PROMETHAZINE HCL 25 MG PO TABS
25.0000 mg | ORAL_TABLET | Freq: Four times a day (QID) | ORAL | Status: DC | PRN
  Administered 2015-12-06 (×3): 25 mg via ORAL
  Filled 2015-12-05 (×3): qty 1

## 2015-12-05 MED ORDER — SENNOSIDES-DOCUSATE SODIUM 8.6-50 MG PO TABS
1.0000 | ORAL_TABLET | Freq: Two times a day (BID) | ORAL | Status: DC
  Administered 2015-12-05 – 2015-12-10 (×10): 1 via ORAL
  Filled 2015-12-05 (×10): qty 1

## 2015-12-05 MED ORDER — KETOROLAC TROMETHAMINE 30 MG/ML IJ SOLN
30.0000 mg | INTRAMUSCULAR | Status: AC
  Administered 2015-12-05: 30 mg via INTRAVENOUS
  Filled 2015-12-05: qty 1

## 2015-12-05 MED ORDER — POLYETHYLENE GLYCOL 3350 17 G PO PACK
17.0000 g | PACK | Freq: Every day | ORAL | Status: DC | PRN
  Administered 2015-12-07 – 2015-12-09 (×2): 17 g via ORAL
  Filled 2015-12-05 (×2): qty 1

## 2015-12-05 MED ORDER — ENOXAPARIN SODIUM 40 MG/0.4ML ~~LOC~~ SOLN
40.0000 mg | SUBCUTANEOUS | Status: DC
  Administered 2015-12-06 – 2015-12-07 (×2): 40 mg via SUBCUTANEOUS
  Filled 2015-12-05 (×5): qty 0.4

## 2015-12-05 NOTE — ED Notes (Signed)
ADMISSION RN KIM PRESENT SPEAKING WITH PT 

## 2015-12-05 NOTE — H&P (Signed)
History and Physical    [name] ZOX:[id number] DOB: [birth year] DOA: 12/05/2015  PCP: Dorrene German, MD Patient coming from: Home  Chief Complaint: Pain  HPI: [name] is a 40 y.o. male with medical history significant of Sickle Cell [place] disease, avascular necrosis, acute chest syndrome. Patient presents for management of sickle cell related pain crisis. Symptoms started about 3 days ago and have gradually worsened. He has been using his home regimen of MS Contin  BID and morphine IR  BID prn which have not helped. Pain is described as sharp and located around his right rib cage with some pain in his right shoulder. No associated dyspnea, cough or fevers. No nausea or vomiting.  ED Course: Vitals were wnl and he was breathing on room air. Chest x-ray significant for no acute pathology. Patient received Toradol and two doses of dilaudid.  Review of Systems: As per HPI otherwise 10 point review of systems negative.   Past Medical History:  Diagnosis Date  . Avascular necrosis of hip (HCC)    bilateral  . Avascular necrosis of hip, left (HCC) 08/27/2011  . Blood transfusion   . Infection of bone, shoulder region (HCC)    left shoulder  . Pneumonia   . Sickle cell crisis [facility])     Past Surgical History:  Procedure Laterality Date  . BONE GRAFT HIP ILIAC CREST    . JOINT REPLACEMENT  2006   right total hip arthroplasty  . Orif right hip fracture  1995     reports that he has been smoking Cigarettes.  He has been smoking about 0.50 packs per day. He has never used smokeless tobacco. He reports that he does not drink alcohol or use drugs.  No Known Allergies  Family History  Problem Relation Age of Onset  . Adopted: Yes    Prior to Admission medications   Medication Sig Start Date End Date Taking? Authorizing Provider  cyclobenzaprine (FLEXERIL) 10 MG tablet Take 10 mg by mouth at bedtime.   Yes Historical Provider, MD  folic acid (FOLVITE) 1 MG tablet Take  1 mg by mouth daily.   Yes Historical Provider, MD  ibuprofen (ADVIL,MOTRIN) 800 MG tablet Take 800 mg by mouth 3 (three) times daily as needed (PAIN).   Yes Historical Provider, MD  morphine (MS CONTIN) 60 MG 12 hr tablet Take 1 tablet (60 mg total) by mouth 2 (two) times daily. Patient taking differently: Take 60 mg by mouth 2 (two) times daily as needed for pain.  11/20/11  Yes Grayce Sessions, NP  morphine (MSIR) 30 MG tablet Take 30 mg by mouth 2 (two) times daily as needed for severe pain (pain).    Yes Historical Provider, MD  promethazine (PHENERGAN) 25 MG tablet Take 25 mg by mouth every 6 (six) hours as needed for nausea (nausea).    Yes Historical Provider, MD    Physical Exam: Vitals:   12/05/15 1700 12/05/15 1725 12/05/15 1752 12/05/15 1820  BP: 116/81 111/72 126/82   Pulse: 65 63 66   Resp: Temp:  97.8 F (36.6 C) 98.3 F (36.8 C)   TempSrc:  Oral Oral   SpO2: 94% 95% 97% 93%  Weight:   80.4 kg (177 lb 3.2 oz)   Height:    (1.88 m)       Constitutional: NAD, calm, comfortable Vitals:   12/05/15 1700 12/05/15 1725 12/05/15 1752 12/05/15 1820  BP: 116/81 111/72 126/82  Pulse: 65 63 66   Resp: 17 16 18 13   Temp:  97.8 F (36.6 C) 98.3 F (36.8 C)   TempSrc:  Oral Oral   SpO2: 94% 95% 97% 93%  Weight:   80.4 kg (177 lb 3.2 oz)   Height:   6\' 2"  (1.88 m)    Eyes: PERRL, lids and conjunctivae normal ENMT: Mucous membranes are moist. Posterior pharynx clear of any exudate or lesions.Normal dentition.  Neck: normal, supple, no masses, no thyromegaly Respiratory: clear to auscultation bilaterally, no wheezing, mild crackles at bases bilaterally. Normal respiratory effort. No accessory muscle use.  Cardiovascular: Regular rate and rhythm, no murmurs / rubs / gallops. No extremity edema. 2+ pedal pulses. No carotid bruits.  Abdomen: no tenderness, no masses palpated. No hepatosplenomegaly. Bowel sounds positive.  Musculoskeletal: Chest and shoulder  pain not very reproducible. no clubbing / cyanosis. No joint deformity upper and lower extremities. Good ROM, no contractures. Normal muscle tone.  Skin: no rashes, lesions, ulcers. No induration Neurologic: CN 2-12 grossly intact. Sensation intact, DTR normal. Strength 5/5 in all 4.  Psychiatric: Normal judgment and insight. Alert and oriented x 3. Normal mood.    Labs on Admission: I have personally reviewed following labs and imaging studies  CBC:  Recent Labs Lab 12/05/15 0924  WBC 14.2*  NEUTROABS 9.0*  HGB 11.0*  HCT 29.9*  MCV 90.3  PLT 342   Basic Metabolic Panel:  Recent Labs Lab 12/05/15 0924  NA 138  K 3.7  CL 105  CO2 26  GLUCOSE 106*  BUN 14  CREATININE 0.81  CALCIUM 9.1   GFR: Estimated Creatinine Clearance: 137.9 mL/min (by C-G formula based on SCr of 0.81 mg/dL). Liver Function Tests:  Recent Labs Lab 12/05/15 0924  AST 25  ALT 14*  ALKPHOS 79  BILITOT 1.7*  PROT 7.9  ALBUMIN 4.3   No results for input(s): LIPASE, AMYLASE in the last 168 hours. No results for input(s): AMMONIA in the last 168 hours. Coagulation Profile: No results for input(s): INR, PROTIME in the last 168 hours. Cardiac Enzymes: No results for input(s): CKTOTAL, CKMB, CKMBINDEX, TROPONINI in the last 168 hours. BNP (last 3 results) No results for input(s): PROBNP in the last 8760 hours. HbA1C: No results for input(s): HGBA1C in the last 72 hours. CBG: No results for input(s): GLUCAP in the last 168 hours. Lipid Profile: No results for input(s): CHOL, HDL, LDLCALC, TRIG, CHOLHDL, LDLDIRECT in the last 72 hours. Thyroid Function Tests: No results for input(s): TSH, T4TOTAL, FREET4, T3FREE, THYROIDAB in the last 72 hours. Anemia Panel:  Recent Labs  12/05/15 0924  RETICCTPCT 2.7   Urine analysis:    Component Value Date/Time   COLORURINE YELLOW 10/16/2015 1325   APPEARANCEUR CLEAR 10/16/2015 1325   LABSPEC 1.008 10/16/2015 1325   PHURINE 6.0 10/16/2015 1325    GLUCOSEU NEGATIVE 10/16/2015 1325   HGBUR NEGATIVE 10/16/2015 1325   BILIRUBINUR NEGATIVE 10/16/2015 1325   KETONESUR NEGATIVE 10/16/2015 1325   PROTEINUR NEGATIVE 10/16/2015 1325   UROBILINOGEN 0.2 10/25/2013 0020   NITRITE NEGATIVE 10/16/2015 1325   LEUKOCYTESUR NEGATIVE 10/16/2015 1325   Radiological Exams on Admission: Dg Chest 2 View  Result Date: 12/05/2015 CLINICAL DATA:  Sickle cell crisis started about 3 days ago. SOB. Weakness. Body aches. EXAM: CHEST  2 VIEW COMPARISON:  10/19/2015 FINDINGS: Cardiac silhouette is normal in size. Normal mediastinal and hilar contours. Areas of lung scarring are stable. There is no evidence of pneumonia or pulmonary edema. No  pleural effusion or pneumothorax. Lungs are mildly hyperexpanded. Skeletal structures are unremarkable. IMPRESSION: No acute cardiopulmonary disease. Electronically Signed   By: David  Ormond M.Amie [place].   On: 12/05/2015 13:21    Assessment/Plan Active Problems:   Sickle cell anemia with pain (HCC)  Sickle cell anemia pain Hemoglobin is at baseline. Retic count is wnl. Pain seems typical for patient's regular areas of pain. Patient does not currently have a hematologist -Dilaudid PCA -Toradol 30mg  q6hrs for 5 days -Regular diet -KVO fluids -repeat CBC in AM -type and screen  Tobacco abuse -patient declines nicotine patch   DVT prophylaxis: Lovenox Code Status: Full code Family Communication: Girlfriend at bedside Disposition Plan: Discharge home with improvement of symptoms Consults called: None Admission status: Observation, medical floor   Jacquelin Hawkingalph Demarr Kluever MD [facility]  If 7PM-7AM, please contact night-coverage [email or web address] Password [email or web address]  12/05/2015, 8:04 PM

## 2015-12-05 NOTE — ED Notes (Signed)
Gave Patient cup of water

## 2015-12-05 NOTE — ED Provider Notes (Signed)
[facility]-EMERGENCY DEPT Provider Note   CSN: [id number] Arrival date & time: 12/05/15  [id number]  First Provider Contact:  First MD Initiated Contact with Patient 12/05/15 0900        History   Chief Complaint Chief Complaint  Patient presents with  . Sickle Cell Pain Crisis  . Flank Pain    HPI [name] is a 40 y.o. male.  HPI CC: right chest pain  Onset/Duration: 3 days ago Timing: constant Location: right ribs Quality: aching Severity: severe Modifying Factors:  Improved by: nothing  Worsened by: movement Associated Signs/Symptoms:  Pertinent (+): nothing  Pertinent (-): fever, chills, cough, n/v/d, SOB, abd pain Context: similar to prior pain crisis. States he is hydrating. Has tried his home MS contin w/o relief.  Past Medical History:  Diagnosis Date  . Avascular necrosis of hip (HCC)    bilateral  . Avascular necrosis of hip, left (HCC) 08/27/2011  . Blood transfusion   . Infection of bone, shoulder region (HCC)    left shoulder  . Pneumonia   . Sickle cell crisis [facility])     Patient Active Problem List   Diagnosis Date Noted  . Cough   . 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 Sickle cell anemia (HCC) 08/13/2015  . Sickle cell disease with crisis (HCC) 05/25/2015  . Chest pain 02/08/2015  . CAP (community acquired pneumonia) 02/08/2015  . Sickle cell crisis acute chest syndrome (HCC) 02/08/2015  . HCAP (healthcare-associated pneumonia) 02/08/2015  . Shoulder pain, right 02/08/2015  . Numbness and tingling in right hand   . Hemoglobin [place] with crisis (HCC) 10/13/2014  . Sickle cell anemia with pain (HCC) 05/31/2014  . Sickle cell crisis (HCC) 10/24/2013  . Sickle cell disease, type [place] (HCC) 10/18/2012  . Sickle cell pain crisis (HCC) 10/13/2012  . History of tobacco abuse 10/13/2012  . Hypokalemia 05/02/2012  . Leukocytosis 05/02/2012  . Anemia 11/08/2011  . Avascular necrosis of hip, left (HCC) 08/27/2011  . Elevated brain natriuretic  peptide (BNP) level 08/25/2011  . Acute chest syndrome (HCC) 05/15/2011  . Tobacco abuse 05/15/2011  . Constipation 05/14/2011  . Sickle cell anemia with crisis (HCC) 05/13/2011    Past Surgical History:  Procedure Laterality Date  . BONE GRAFT HIP ILIAC CREST    . JOINT REPLACEMENT  2006   right total hip arthroplasty  . Orif right hip fracture  1995       Home Medications    Prior to Admission medications   Medication Sig Start Date End Date Taking? Authorizing Provider  cyclobenzaprine (FLEXERIL) 10 MG tablet Take 10 mg by mouth at bedtime.   Yes Historical Provider, MD  folic acid (FOLVITE) 1 MG tablet Take 1 mg by mouth daily.   Yes Historical Provider, MD  ibuprofen (ADVIL,MOTRIN) 800 MG tablet Take 800 mg by mouth 3 (three) times daily as needed (PAIN).   Yes Historical Provider, MD  morphine (MS CONTIN) 60 MG 12 hr tablet Take 1 tablet (60 mg total) by mouth 2 (two) times daily. Patient taking differently: Take 60 mg by mouth 2 (two) times daily as needed for pain.  11/20/11  Yes Grayce Sessions, NP  morphine (MSIR) 30 MG tablet Take 30 mg by mouth 2 (two) times daily as needed for severe pain (pain).    Yes Historical Provider, MD  promethazine (PHENERGAN) 25 MG tablet Take 25 mg by mouth every 6 (six) hours as needed for nausea (nausea).    Yes Historical  Provider, MD    Family History Family History  Problem Relation Age of Onset  . Adopted: Yes    Social History Social History  Substance Use Topics  . Smoking status: Current Some Day Smoker    Packs/day: 0.50    Types: Cigarettes    Last attempt to quit: 01/27/2012  . Smokeless tobacco: Never Used  . Alcohol use No     Allergies   Review of patient's allergies indicates no known allergies.   Review of Systems Review of Systems  Constitutional: Negative for chills, fatigue and fever.  HENT: Negative for congestion.   Eyes: Negative for visual disturbance.  Respiratory: Negative for cough, chest  tightness and shortness of breath.   Cardiovascular: Negative for leg swelling.  Gastrointestinal: Negative for abdominal pain, blood in stool, diarrhea, nausea and vomiting.  Genitourinary: Negative for difficulty urinating.  Musculoskeletal: Negative for back pain and gait problem.  Skin: Negative for rash.  Neurological: Negative for dizziness and headaches.  All other systems reviewed and are negative.    Physical Exam Updated Vital Signs BP 115/71 (BP Location: Right Arm)   Pulse 77   Temp 97.8 F (36.6 C) (Oral)   Resp 16   Ht 6\' 2"  (1.88 m)   Wt 170 lb (77.1 kg)   SpO2 98%   BMI 21.83 kg/m   Physical Exam  Constitutional: He is oriented to person, place, and time. He appears well-nourished. No distress.  HENT:  Head: Normocephalic and atraumatic.  Right Ear: External ear normal.  Left Ear: External ear normal.  Eyes: Pupils are equal, round, and reactive to light. Right eye exhibits no discharge. Left eye exhibits no discharge. No scleral icterus.  Neck: Normal range of motion. Neck supple.  Cardiovascular: Normal rate.  Exam reveals no gallop and no friction rub.   No murmur heard. Pulmonary/Chest: Effort normal and breath sounds normal. No stridor. No respiratory distress. He has no wheezes. He has no rales. He exhibits no tenderness.  Abdominal: Soft. He exhibits no distension and no mass. There is no tenderness. There is no rebound and no guarding.  Musculoskeletal: He exhibits no edema or tenderness.  Neurological: He is alert and oriented to person, place, and time.  Skin: Skin is warm and dry. No rash noted. He is not diaphoretic. No erythema.     ED Treatments / Results  Labs (all labs ordered are listed, but only abnormal results are displayed) Labs Reviewed  COMPREHENSIVE METABOLIC PANEL - Abnormal; Notable for the following:       Result Value   Glucose, Bld 106 (*)    ALT 14 (*)    Total Bilirubin 1.7 (*)    All other components within normal  limits  CBC WITH DIFFERENTIAL/PLATELET - Abnormal; Notable for the following:    WBC 14.2 (*)    RBC 3.31 (*)    Hemoglobin 11.0 (*)    HCT 29.9 (*)    MCHC 36.8 (*)    Neutro Abs 9.0 (*)    Monocytes Absolute 1.2 (*)    All other components within normal limits  RETICULOCYTES - Abnormal; Notable for the following:    RBC. 3.29 (*)    All other components within normal limits  CBC  TYPE AND SCREEN    EKG  EKG Interpretation None       Radiology Dg Chest 2 View  Result Date: 12/05/2015 CLINICAL DATA:  Sickle cell crisis started about 3 days ago. SOB. Weakness. Body aches. EXAM: CHEST  2 VIEW COMPARISON:  10/19/2015 FINDINGS: Cardiac silhouette is normal in size. Normal mediastinal and hilar contours. Areas of lung scarring are stable. There is no evidence of pneumonia or pulmonary edema. No pleural effusion or pneumothorax. Lungs are mildly hyperexpanded. Skeletal structures are unremarkable. IMPRESSION: No acute cardiopulmonary disease. Electronically Signed   By: Amie Portlandavid  Ormond M.D.   On: 12/05/2015 13:21    Procedures Procedures (including critical care time)  Medications Ordered in ED Medications  cyclobenzaprine (FLEXERIL) tablet 10 mg (not administered)  folic acid (FOLVITE) tablet 1 mg (not administered)  promethazine (PHENERGAN) tablet 25 mg (not administered)  senna-docusate (Senokot-S) tablet 1 tablet (not administered)  polyethylene glycol (MIRALAX / GLYCOLAX) packet 17 g (not administered)  enoxaparin (LOVENOX) injection 40 mg (not administered)  ketorolac (TORADOL) 30 MG/ML injection 30 mg (30 mg Intravenous Given 12/05/15 1839)  naloxone (NARCAN) injection 0.4 mg (not administered)    And  sodium chloride flush (NS) 0.9 % injection 9 mL (not administered)  ondansetron (ZOFRAN) injection 4 mg (not administered)  diphenhydrAMINE (BENADRYL) capsule 25-50 mg (not administered)    Or  diphenhydrAMINE (BENADRYL) 25 mg in sodium chloride 0.9 % 50 mL IVPB (not  administered)  HYDROmorphone (DILAUDID) 1 mg/mL PCA injection ( Intravenous Set-up / Initial Syringe 12/05/15 1820)  ketorolac (TORADOL) 30 MG/ML injection 30 mg (30 mg Intravenous Given 12/05/15 1054)  HYDROmorphone (DILAUDID) injection 1.9 mg (1.9 mg Intravenous Given 12/05/15 1052)    Or  HYDROmorphone (DILAUDID) injection 1.9 mg ( Subcutaneous See Alternative 12/05/15 1052)  HYDROmorphone (DILAUDID) injection 2 mg (2 mg Intravenous Given 12/05/15 1424)     Initial Impression / Assessment and Plan / ED Course  I have reviewed the triage vital signs and the nursing notes.  Pertinent labs & imaging results that were available during my care of the patient were reviewed by me and considered in my medical decision making (see chart for details).  Clinical Course    Typical sickle pain per pt as of recently. No infectious sxs. No trauma.  No improvement with home meds.  AFVSS. Nontoxic. In obvious pain.  Labs stable when compared to prior with no significant anemai. retic wnl. No transaminitis. CXR unremarkable; w/o evidence of acute chest.   Given toradol and dilaudid x3 with minimal improvement. Will admit for pain control.  Final Clinical Impressions(s) / ED Diagnoses   Final diagnoses:  Rib pain on right side    Disposition: Admit  Condition: stable    Nira ConnPedro Eduardo Cardama, MD 12/05/15 2126

## 2015-12-05 NOTE — ED Notes (Addendum)
MD at bedside. Plan to admit patient.

## 2015-12-05 NOTE — ED Notes (Signed)
MD at bedside. ADMISSION MD PRESENT 

## 2015-12-05 NOTE — ED Notes (Signed)
MD at bedside. EDP PRESENT 

## 2015-12-05 NOTE — ED Triage Notes (Signed)
Pt c/o sickle cell pain x3 days denies any chest pain but reports bilateral side pain. Last pain meds taken last night. Denies N/V/D. A/O at triage NAD.

## 2015-12-06 DIAGNOSIS — Z96641 Presence of right artificial hip joint: Secondary | ICD-10-CM | POA: Diagnosis present

## 2015-12-06 DIAGNOSIS — K5900 Constipation, unspecified: Secondary | ICD-10-CM | POA: Diagnosis present

## 2015-12-06 DIAGNOSIS — D72829 Elevated white blood cell count, unspecified: Secondary | ICD-10-CM | POA: Diagnosis present

## 2015-12-06 DIAGNOSIS — Z79899 Other long term (current) drug therapy: Secondary | ICD-10-CM | POA: Diagnosis not present

## 2015-12-06 DIAGNOSIS — R0781 Pleurodynia: Secondary | ICD-10-CM | POA: Diagnosis present

## 2015-12-06 DIAGNOSIS — D57219 Sickle-cell/Hb-C disease with crisis, unspecified: Secondary | ICD-10-CM | POA: Diagnosis not present

## 2015-12-06 DIAGNOSIS — D5700 Hb-SS disease with crisis, unspecified: Secondary | ICD-10-CM | POA: Diagnosis present

## 2015-12-06 DIAGNOSIS — F17210 Nicotine dependence, cigarettes, uncomplicated: Secondary | ICD-10-CM | POA: Diagnosis present

## 2015-12-06 LAB — CBC
HCT: 27.5 % — ABNORMAL LOW (ref 39.0–52.0)
HEMOGLOBIN: 10 g/dL — AB (ref 13.0–17.0)
MCH: 32.8 pg (ref 26.0–34.0)
MCHC: 36.4 g/dL — ABNORMAL HIGH (ref 30.0–36.0)
MCV: 90.2 fL (ref 78.0–100.0)
Platelets: 298 10*3/uL (ref 150–400)
RBC: 3.05 MIL/uL — AB (ref 4.22–5.81)
RDW: 14.5 % (ref 11.5–15.5)
WBC: 12 10*3/uL — ABNORMAL HIGH (ref 4.0–10.5)

## 2015-12-06 LAB — RETICULOCYTES
RBC.: 3.03 MIL/uL — AB (ref 4.22–5.81)
Retic Count, Absolute: 81.8 10*3/uL (ref 19.0–186.0)
Retic Ct Pct: 2.7 % (ref 0.4–3.1)

## 2015-12-06 LAB — DIFFERENTIAL
BASOS ABS: 0.1 10*3/uL (ref 0.0–0.1)
Basophils Relative: 1 %
Eosinophils Absolute: 0.6 10*3/uL (ref 0.0–0.7)
Eosinophils Relative: 5 %
LYMPHS ABS: 3.2 10*3/uL (ref 0.7–4.0)
LYMPHS PCT: 28 %
MONO ABS: 1.3 10*3/uL — AB (ref 0.1–1.0)
MONOS PCT: 12 %
NEUTROS ABS: 6.3 10*3/uL (ref 1.7–7.7)
Neutrophils Relative %: 54 %

## 2015-12-06 MED ORDER — LACTULOSE 10 GM/15ML PO SOLN
30.0000 g | Freq: Every day | ORAL | Status: DC | PRN
  Administered 2015-12-06 – 2015-12-09 (×2): 30 g via ORAL
  Filled 2015-12-06 (×2): qty 45

## 2015-12-06 MED ORDER — HYDROMORPHONE HCL 2 MG/ML IJ SOLN
2.0000 mg | INTRAMUSCULAR | Status: AC
  Administered 2015-12-06 (×6): 2 mg via INTRAVENOUS
  Filled 2015-12-06 (×6): qty 1

## 2015-12-06 MED ORDER — PROMETHAZINE HCL 25 MG/ML IJ SOLN
25.0000 mg | Freq: Four times a day (QID) | INTRAMUSCULAR | Status: DC | PRN
  Administered 2015-12-07 – 2015-12-10 (×5): 25 mg via INTRAVENOUS
  Filled 2015-12-06 (×5): qty 1

## 2015-12-06 MED ORDER — HYDROMORPHONE HCL 4 MG PO TABS
4.0000 mg | ORAL_TABLET | ORAL | Status: DC | PRN
  Administered 2015-12-07 (×3): 4 mg via ORAL
  Administered 2015-12-07: 2 mg via ORAL
  Filled 2015-12-06 (×4): qty 1

## 2015-12-06 MED ORDER — MORPHINE SULFATE ER 15 MG PO TBCR
60.0000 mg | EXTENDED_RELEASE_TABLET | Freq: Two times a day (BID) | ORAL | Status: DC
  Administered 2015-12-06 – 2015-12-10 (×8): 60 mg via ORAL
  Filled 2015-12-06 (×9): qty 4

## 2015-12-06 NOTE — Progress Notes (Signed)
SICKLE CELL SERVICE PROGRESS NOTE  [name] ZOX:[id number] DOB: [birth year] DOA: 12/05/2015 PCP: Dorrene GermanEdwin A Avbuere, MD  Assessment/Plan: Active Problems:   Sickle cell anemia with pain (HCC)  1. Hb [place] with crisis: Continue PCA, Toradol and clinician assisted doses as currently prescribed.  2. Leukocytosis: Mild leukocytosis without evidence of infection.  3. Anemia of chronic disease. Hb at baseline.  4. Chronic pain: Continue MS Contin.  5. Constipation: Pt typically has constipation with opiates during hospitalization. Will order lactulose.   Code Status: Full Code Family Communication: N/A Disposition Plan: Not yet ready for discharge  [name] A.  Pager [phone]. If 7PM-7AM, please contact night-coverage.  12/06/2015, 2:10 PM  LOS: 0 days   INterim History: Pt reports better control of pain since receiving clinician assisted doses. He rates intensity of pian as 8/10. Pt has had nausea but no emesis.  Consultants:  None  Procedures:  None  Antibiotics:  None   Objective: Vitals:   12/06/15 0633 12/06/15 0732 12/06/15 1025 12/06/15 1210  BP: 134/81  (!) 126/91   Pulse: 77  68   Resp: 15 15 16 12   Temp: 97.7 F (36.5 C)  98.2 F (36.8 C)   TempSrc: Oral  Oral   SpO2: 94% 95% 95% 95%  Weight: 80.4 kg (177 lb 3.2 oz)     Height:       Weight change:   Intake/Output Summary (Last 24 hours) at 12/06/15 1410 Last data filed at 12/05/15 2250  Gross per 24 hour  Intake             1440 ml  Output              375 ml  Net             1065 ml    General: Alert, awake, oriented x3, in no acute distress.  HEENT: [place]/AT PEERL, EOMI, anicteric Neck: Trachea midline,  no masses, no thyromegal,y no JVD, no carotid bruit OROPHARYNX:  Moist, No exudate/ erythema/lesions.  Heart: Regular rate and rhythm, without murmurs, rubs, gallops, PMI non-displaced, no heaves or thrills on palpation.  Lungs: Clear to auscultation, no wheezing or rhonchi noted. No  increased vocal fremitus resonant to percussion  Abdomen: Soft, nontender, nondistended, positive bowel sounds, no masses no hepatosplenomegaly noted.  Neuro: No focal neurological deficits noted cranial nerves II through XII grossly intact.  Strength at functional baseline in bilateral upper and lower extremities. Musculoskeletal: No warmth swelling or erythema around joints, no spinal tenderness noted. Psychiatric: Patient alert and oriented x3, good insight and cognition, good recent to remote recall.    Data Reviewed: Basic Metabolic Panel:  Recent Labs Lab 12/05/15 0924  NA 138  K 3.7  CL 105  CO2 26  GLUCOSE 106*  BUN 14  CREATININE 0.81  CALCIUM 9.1   Liver Function Tests:  Recent Labs Lab 12/05/15 0924  AST 25  ALT 14*  ALKPHOS 79  BILITOT 1.7*  PROT 7.9  ALBUMIN 4.3   No results for input(s): LIPASE, AMYLASE in the last 168 hours. No results for input(s): AMMONIA in the last 168 hours. CBC:  Recent Labs Lab 12/05/15 0924 12/06/15 0343  WBC 14.2* 12.0*  NEUTROABS 9.0* 6.3  HGB 11.0* 10.0*  HCT 29.9* 27.5*  MCV 90.3 90.2  PLT 342 298   Cardiac Enzymes: No results for input(s): CKTOTAL, CKMB, CKMBINDEX, TROPONINI in the last 168 hours. BNP (last 3 results) No results for input(s): BNP in the last  8760 hours.  ProBNP (last 3 results) No results for input(s): PROBNP in the last 8760 hours.  CBG: No results for input(s): GLUCAP in the last 168 hours.  No results found for this or any previous visit (from the past 240 hour(s)).   Studies: Dg Chest 2 View  Result Date: 12/05/2015 CLINICAL DATA:  Sickle cell crisis started about 3 days ago. SOB. Weakness. Body aches. EXAM: CHEST  2 VIEW COMPARISON:  10/19/2015 FINDINGS: Cardiac silhouette is normal in size. Normal mediastinal and hilar contours. Areas of lung scarring are stable. There is no evidence of pneumonia or pulmonary edema. No pleural effusion or pneumothorax. Lungs are mildly hyperexpanded.  Skeletal structures are unremarkable. IMPRESSION: No acute cardiopulmonary disease. Electronically Signed   By: Amie Portland M.D.   On: 12/05/2015 13:21    Scheduled Meds: . cyclobenzaprine  10 mg Oral QHS  . enoxaparin (LOVENOX) injection  40 mg Subcutaneous Q24H  . folic acid  1 mg Oral Daily  . HYDROmorphone   Intravenous Q4H  .  HYDROmorphone (DILAUDID) injection  2 mg Intravenous Q2H  . ketorolac  30 mg Intravenous Q6H  . senna-docusate  1 tablet Oral BID   Continuous Infusions:   Active Problems:   Sickle cell anemia with pain (HCC)        In excess of 25 minutes spent during this visit. Greater than 50% involved face to face contact with the patient for assessment, counseling and coordination of care.

## 2015-12-07 NOTE — Progress Notes (Signed)
SICKLE CELL SERVICE PROGRESS NOTE  [name] E [place] HQI:[id number] DOB: 06/26/1975 DOA: 12/05/2015 PCP: Dorrene GermanEdwin A Avbuere, MD  Assessment/Plan: Active Problems:   Sickle cell anemia with pain (HCC)  1. Hb [place] with crisis: Improving. Continue PCA, Toradol and clinician assisted doses as currently prescribed.  2. Leukocytosis: Mild leukocytosis without evidence of infection.  3. Anemia of chronic disease. Hb at baseline.  4. Chronic pain: Continue MS Contin.  5. Constipation: Pt typically has constipation with opiates during hospitalization. Will order lactulose.   Code Status: Full Code Family Communication: N/A Disposition Plan: Not yet ready for discharge  [name]  Pager [phone]. If 7PM-7AM, please contact night-coverage.  12/07/2015, 7:51 AM  LOS: 1 day   INterim History: Pt reports better control of pain since receiving clinician assisted doses. He rates intensity of pian as 6/10. Pt has had nausea but no emesis.  Consultants:  None  Procedures:  None  Antibiotics:  None   Objective: Vitals:   12/07/15 0000 12/07/15 0104 12/07/15 0400 12/07/15 0524  BP:  (!) 141/80  130/79  Pulse:  71  77  Resp: 14 14 10 12   Temp:  98.1 F (36.7 C)  98 F (36.7 C)  TempSrc:  Oral  Oral  SpO2: 96% 97% 94% 93%  Weight:    80.5 kg (177 lb 6.4 oz)  Height:       Weight change: -1.179 kg (-2 lb 9.6 oz)  Intake/Output Summary (Last 24 hours) at 12/07/15 0751 Last data filed at 12/07/15 0524  Gross per 24 hour  Intake           2654.7 ml  Output              800 ml  Net           1854.7 ml    General: Alert, awake, oriented x3, in no acute distress.  HEENT: [place]/AT PEERL, EOMI, anicteric Neck: Trachea midline,  no masses, no thyromegal,y no JVD, no carotid bruit OROPHARYNX:  Moist, No exudate/ erythema/lesions.  Heart: Regular rate and rhythm, without murmurs, rubs, gallops, PMI non-displaced, no heaves or thrills on palpation.  Lungs: Clear to auscultation, no wheezing or  rhonchi noted. No increased vocal fremitus resonant to percussion  Abdomen: Soft, nontender, nondistended, positive bowel sounds, no masses no hepatosplenomegaly noted.  Neuro: No focal neurological deficits noted cranial nerves II through XII grossly intact.  Strength at functional baseline in bilateral upper and lower extremities. Musculoskeletal: No warmth swelling or erythema around joints, no spinal tenderness noted. Psychiatric: Patient alert and oriented x3, good insight and cognition, good recent to remote recall.    Data Reviewed: Basic Metabolic Panel:  Recent Labs Lab 12/05/15 0924  NA 138  K 3.7  CL 105  CO2 26  GLUCOSE 106*  BUN 14  CREATININE 0.81  CALCIUM 9.1   Liver Function Tests:  Recent Labs Lab 12/05/15 0924  AST 25  ALT 14*  ALKPHOS 79  BILITOT 1.7*  PROT 7.9  ALBUMIN 4.3   No results for input(s): LIPASE, AMYLASE in the last 168 hours. No results for input(s): AMMONIA in the last 168 hours. CBC:  Recent Labs Lab 12/05/15 0924 12/06/15 0343  WBC 14.2* 12.0*  NEUTROABS 9.0* 6.3  HGB 11.0* 10.0*  HCT 29.9* 27.5*  MCV 90.3 90.2  PLT 342 298   Cardiac Enzymes: No results for input(s): CKTOTAL, CKMB, CKMBINDEX, TROPONINI in the last 168 hours. BNP (last 3 results) No results for input(s): BNP in the  last 8760 hours.  ProBNP (last 3 results) No results for input(s): PROBNP in the last 8760 hours.  CBG: No results for input(s): GLUCAP in the last 168 hours.  No results found for this or any previous visit (from the past 240 hour(s)).   Studies: Dg Chest 2 View  Result Date: 12/05/2015 CLINICAL DATA:  Sickle cell crisis started about 3 days ago. SOB. Weakness. Body aches. EXAM: CHEST  2 VIEW COMPARISON:  10/19/2015 FINDINGS: Cardiac silhouette is normal in size. Normal mediastinal and hilar contours. Areas of lung scarring are stable. There is no evidence of pneumonia or pulmonary edema. No pleural effusion or pneumothorax. Lungs are  mildly hyperexpanded. Skeletal structures are unremarkable. IMPRESSION: No acute cardiopulmonary disease. Electronically Signed   By: Amie Portland M.D.   On: 12/05/2015 13:21    Scheduled Meds: . cyclobenzaprine  10 mg Oral QHS  . enoxaparin (LOVENOX) injection  40 mg Subcutaneous Q24H  . folic acid  1 mg Oral Daily  . HYDROmorphone   Intravenous Q4H  . ketorolac  30 mg Intravenous Q6H  . morphine  60 mg Oral BID  . senna-docusate  1 tablet Oral BID   Continuous Infusions:   Active Problems:   Sickle cell anemia with pain (HCC)        In excess of 25 minutes spent during this visit. Greater than 50% involved face to face contact with the patient for assessment, counseling and coordination of care.

## 2015-12-08 DIAGNOSIS — D5700 Hb-SS disease with crisis, unspecified: Principal | ICD-10-CM

## 2015-12-08 NOTE — Progress Notes (Signed)
SICKLE CELL SERVICE PROGRESS NOTE  [name] [id number] DOB: 1975/11/21 DOA: 12/05/2015 PCP: Dorrene GermanEdwin A Avbuere, MD  Assessment/Plan: Active Problems:   Sickle cell anemia with pain (HCC)  1. Hb [place] with crisis: Patient slowly improving. Continue current dose of PCA, Toradol and clinician assisted doses as currently prescribed.  2. Leukocytosis: Mild leukocytosis without evidence of infection.  3. Anemia of chronic disease. Hb at baseline.  4. Chronic pain: Continue MS Contin.  5. Constipation: Pt typically has constipation with opiates during hospitalization. Will continue lactulose.   Code Status: Full Code Family Communication: N/A Disposition Plan: Not yet ready for discharge  [name]  Pager [phone]. If 7PM-7AM, please contact night-coverage.  12/08/2015, 3:38 PM  LOS: 2 days   INterim History: Pt reports better control of pain since receiving clinician assisted doses. He rates intensity of pian as 7/10. Pt has had no  nausea or vomiting today. Consultants:  None  Procedures:  None  Antibiotics:  None   Objective: Vitals:   12/08/15 0719 12/08/15 1035 12/08/15 1208 12/08/15 1500  BP:  116/83  128/80  Pulse:  91  95  Resp: 11 16 11 14   Temp:  98.4 F (36.9 C)  98.6 F (37 C)  TempSrc:  Oral  Oral  SpO2: 99% 94% 95% 95%  Weight:      Height:       Weight change: 0.532 kg (1 lb 2.8 oz)  Intake/Output Summary (Last 24 hours) at 12/08/15 1538 Last data filed at 12/08/15 1500  Gross per 24 hour  Intake                0 ml  Output             2100 ml  Net            -2100 ml    General: Alert, awake, oriented x3, in no acute distress.  HEENT: [place]/AT PEERL, EOMI, anicteric Neck: Trachea midline,  no masses, no thyromegal,y no JVD, no carotid bruit OROPHARYNX:  Moist, No exudate/ erythema/lesions.  Heart: Regular rate and rhythm, without murmurs, rubs, gallops, PMI non-displaced, no heaves or thrills on palpation.  Lungs: Clear to auscultation, no  wheezing or rhonchi noted. No increased vocal fremitus resonant to percussion  Abdomen: Soft, nontender, nondistended, positive bowel sounds, no masses no hepatosplenomegaly noted.  Neuro: No focal neurological deficits noted cranial nerves II through XII grossly intact.  Strength at functional baseline in bilateral upper and lower extremities. Musculoskeletal: No warmth swelling or erythema around joints, no spinal tenderness noted. Psychiatric: Patient alert and oriented x3, good insight and cognition, good recent to remote recall.    Data Reviewed: Basic Metabolic Panel:  Recent Labs Lab 12/05/15 0924  NA 138  K 3.7  CL 105  CO2 26  GLUCOSE 106*  BUN 14  CREATININE 0.81  CALCIUM 9.1   Liver Function Tests:  Recent Labs Lab 12/05/15 0924  AST 25  ALT 14*  ALKPHOS 79  BILITOT 1.7*  PROT 7.9  ALBUMIN 4.3   No results for input(s): LIPASE, AMYLASE in the last 168 hours. No results for input(s): AMMONIA in the last 168 hours. CBC:  Recent Labs Lab 12/05/15 0924 12/06/15 0343  WBC 14.2* 12.0*  NEUTROABS 9.0* 6.3  HGB 11.0* 10.0*  HCT 29.9* 27.5*  MCV 90.3 90.2  PLT 342 298   Cardiac Enzymes: No results for input(s): CKTOTAL, CKMB, CKMBINDEX, TROPONINI in the last 168 hours. BNP (last 3 results) No results  for input(s): BNP in the last 8760 hours.  ProBNP (last 3 results) No results for input(s): PROBNP in the last 8760 hours.  CBG: No results for input(s): GLUCAP in the last 168 hours.  No results found for this or any previous visit (from the past 240 hour(s)).   Studies: Dg Chest 2 View  Result Date: 12/05/2015 CLINICAL DATA:  Sickle cell crisis started about 3 days ago. SOB. Weakness. Body aches. EXAM: CHEST  2 VIEW COMPARISON:  10/19/2015 FINDINGS: Cardiac silhouette is normal in size. Normal mediastinal and hilar contours. Areas of lung scarring are stable. There is no evidence of pneumonia or pulmonary edema. No pleural effusion or pneumothorax.  Lungs are mildly hyperexpanded. Skeletal structures are unremarkable. IMPRESSION: No acute cardiopulmonary disease. Electronically Signed   By: Amie Portland M.D.   On: 12/05/2015 13:21    Scheduled Meds: . cyclobenzaprine  10 mg Oral QHS  . enoxaparin (LOVENOX) injection  40 mg Subcutaneous Q24H  . folic acid  1 mg Oral Daily  . HYDROmorphone   Intravenous Q4H  . ketorolac  30 mg Intravenous Q6H  . morphine  60 mg Oral BID  . senna-docusate  1 tablet Oral BID   Continuous Infusions:   Active Problems:   Sickle cell anemia with pain (HCC)        In excess of 25 minutes spent during this visit. Greater than 50% involved face to face contact with the patient for assessment, counseling and coordination of care.

## 2015-12-09 LAB — COMPREHENSIVE METABOLIC PANEL
ALT: 17 U/L (ref 17–63)
AST: 29 U/L (ref 15–41)
Albumin: 4.2 g/dL (ref 3.5–5.0)
Alkaline Phosphatase: 72 U/L (ref 38–126)
Anion gap: 3 — ABNORMAL LOW (ref 5–15)
BUN: 12 mg/dL (ref 6–20)
CALCIUM: 8.8 mg/dL — AB (ref 8.9–10.3)
CO2: 32 mmol/L (ref 22–32)
CREATININE: 0.75 mg/dL (ref 0.61–1.24)
Chloride: 103 mmol/L (ref 101–111)
Glucose, Bld: 115 mg/dL — ABNORMAL HIGH (ref 65–99)
Potassium: 4.4 mmol/L (ref 3.5–5.1)
SODIUM: 138 mmol/L (ref 135–145)
Total Bilirubin: 1.9 mg/dL — ABNORMAL HIGH (ref 0.3–1.2)
Total Protein: 7.5 g/dL (ref 6.5–8.1)

## 2015-12-09 LAB — CBC WITH DIFFERENTIAL/PLATELET
BASOS ABS: 0.1 10*3/uL (ref 0.0–0.1)
Basophils Relative: 1 %
EOS ABS: 1 10*3/uL — AB (ref 0.0–0.7)
EOS PCT: 10 %
HCT: 24.1 % — ABNORMAL LOW (ref 39.0–52.0)
HEMOGLOBIN: 8.9 g/dL — AB (ref 13.0–17.0)
LYMPHS PCT: 25 %
Lymphs Abs: 2.5 10*3/uL (ref 0.7–4.0)
MCH: 33.1 pg (ref 26.0–34.0)
MCHC: 36.9 g/dL — ABNORMAL HIGH (ref 30.0–36.0)
MCV: 89.6 fL (ref 78.0–100.0)
Monocytes Absolute: 1.1 10*3/uL — ABNORMAL HIGH (ref 0.1–1.0)
Monocytes Relative: 11 %
NEUTROS PCT: 53 %
Neutro Abs: 5.3 10*3/uL (ref 1.7–7.7)
PLATELETS: 250 10*3/uL (ref 150–400)
RBC: 2.69 MIL/uL — AB (ref 4.22–5.81)
RDW: 15.6 % — ABNORMAL HIGH (ref 11.5–15.5)
WBC: 10 10*3/uL (ref 4.0–10.5)

## 2015-12-09 MED ORDER — NALOXEGOL OXALATE 25 MG PO TABS
25.0000 mg | ORAL_TABLET | Freq: Every day | ORAL | Status: DC
  Administered 2015-12-09 – 2015-12-10 (×2): 25 mg via ORAL
  Filled 2015-12-09 (×2): qty 1

## 2015-12-09 NOTE — Progress Notes (Signed)
SICKLE CELL SERVICE PROGRESS NOTE  [name] UJW:[id number] DOB: [birth year] DOA: 12/05/2015 PCP: Dorrene GermanEdwin A Avbuere, MD  Assessment/Plan: Active Problems:   Sickle cell anemia with pain (HCC)  1. Hb [place] with crisis: Patient slowly improving. Continue current dose of PCA, Toradol and decrease clinician assisted doses as currently prescribed.  2. Leukocytosis: Resolved. [facility]. 3. Anemia of chronic disease. Hb at baseline.  4. Chronic pain: Continue MS Contin.  5. Constipation: Pt typically has constipation with opiates during hospitalization. Has had BM. Will continue lactulose.   Code Status: Full Code Family Communication: N/A Disposition Plan: Not yet ready for discharge  [name]  Pager [phone]. If 7PM-7AM, please contact night-coverage.  12/09/2015, 5:42 PM  LOS: 3 days   INterim History: Pt reports better control of pain since receiving clinician assisted doses. He rates intensity of pian as 6/10. Pt has had no  nausea or vomiting today. He has moved around. No fever or chills.  Consultants:  None  Procedures:  None  Antibiotics:  None   Objective: Vitals:   12/09/15 0545 12/09/15 0808 12/09/15 1230 12/09/15 1531  BP: 120/73     Pulse: 73     Resp: 10 10 15 11   Temp: 98.5 F (36.9 C)     TempSrc: Oral     SpO2: 94% 97% 95%   Weight:      Height:       Weight change:   Intake/Output Summary (Last 24 hours) at 12/09/15 1742 Last data filed at 12/09/15 0900  Gross per 24 hour  Intake              400 ml  Output             1900 ml  Net            -1500 ml    General: Alert, awake, oriented x3, in no acute distress.  HEENT: [place]/AT PEERL, EOMI, anicteric Neck: Trachea midline,  no masses, no thyromegal,y no JVD, no carotid bruit OROPHARYNX:  Moist, No exudate/ erythema/lesions.  Heart: Regular rate and rhythm, without murmurs, rubs, gallops, PMI non-displaced, no heaves or thrills on palpation.  Lungs: Clear to auscultation, no wheezing or  rhonchi noted. No increased vocal fremitus resonant to percussion  Abdomen: Soft, nontender, nondistended, positive bowel sounds, no masses no hepatosplenomegaly noted.  Neuro: No focal neurological deficits noted cranial nerves II through XII grossly intact.  Strength at functional baseline in bilateral upper and lower extremities. Musculoskeletal: No warmth swelling or erythema around joints, no spinal tenderness noted. Psychiatric: Patient alert and oriented x3, good insight and cognition, good recent to remote recall.    Data Reviewed: Basic Metabolic Panel:  Recent Labs Lab 12/05/15 0924 12/09/15 0427  NA 138 138  K 3.7 4.4  CL 105 103  CO2 26 32  GLUCOSE 106* 115*  BUN 14 12  CREATININE 0.81 0.75  CALCIUM 9.1 8.8*   Liver Function Tests:  Recent Labs Lab 12/05/15 0924 12/09/15 0427  AST 25 29  ALT 14* 17  ALKPHOS 79 72  BILITOT 1.7* 1.9*  PROT 7.9 7.5  ALBUMIN 4.3 4.2   No results for input(s): LIPASE, AMYLASE in the last 168 hours. No results for input(s): AMMONIA in the last 168 hours. CBC:  Recent Labs Lab 12/05/15 0924 12/06/15 0343 12/09/15 0427  WBC 14.2* 12.0* 10.0  NEUTROABS 9.0* 6.3 5.3  HGB 11.0* 10.0* 8.9*  HCT 29.9* 27.5* 24.1*  MCV 90.3 90.2 89.6  PLT  342 298 250   Cardiac Enzymes: No results for input(s): CKTOTAL, CKMB, CKMBINDEX, TROPONINI in the last 168 hours. BNP (last 3 results) No results for input(s): BNP in the last 8760 hours.  ProBNP (last 3 results) No results for input(s): PROBNP in the last 8760 hours.  CBG: No results for input(s): GLUCAP in the last 168 hours.  No results found for this or any previous visit (from the past 240 hour(s)).   Studies: Dg Chest 2 View  Result Date: 12/05/2015 CLINICAL DATA:  Sickle cell crisis started about 3 days ago. SOB. Weakness. Body aches. EXAM: CHEST  2 VIEW COMPARISON:  10/19/2015 FINDINGS: Cardiac silhouette is normal in size. Normal mediastinal and hilar contours. Areas of  lung scarring are stable. There is no evidence of pneumonia or pulmonary edema. No pleural effusion or pneumothorax. Lungs are mildly hyperexpanded. Skeletal structures are unremarkable. IMPRESSION: No acute cardiopulmonary disease. Electronically Signed   By: Amie Portlandavid  Ormond M.D.   On: 12/05/2015 13:21    Scheduled Meds: . cyclobenzaprine  10 mg Oral QHS  . enoxaparin (LOVENOX) injection  40 mg Subcutaneous Q24H  . folic acid  1 mg Oral Daily  . HYDROmorphone   Intravenous Q4H  . ketorolac  30 mg Intravenous Q6H  . morphine  60 mg Oral BID  . senna-docusate  1 tablet Oral BID   Continuous Infusions:   Active Problems:   Sickle cell anemia with pain (HCC)        In excess of 25 minutes spent during this visit. Greater than 50% involved face to face contact with the patient for assessment, counseling and coordination of care.

## 2015-12-10 DIAGNOSIS — R0781 Pleurodynia: Secondary | ICD-10-CM

## 2015-12-10 NOTE — Care Management Important Message (Signed)
Important Message  Patient Details  Name: [name] MRN: [id number] Date of Birth: Jun 22, 1975   Medicare Important Message Given:  Yes    Haskell FlirtJamison, Valecia Beske 12/10/2015, 10:20 AMImportant Message  Patient Details  Name: [name] MRN: [id number] Date of Birth: Jun 22, 1975   Medicare Important Message Given:  Yes    Haskell FlirtJamison, Diesel Lina 12/10/2015, 10:20 AM

## 2015-12-10 NOTE — Care Management Note (Signed)
Case Management Note  Patient Details  Name: [name] MRN: [id number] Date of Birth: 11/04/1975  Subjective/Objective:     40 yo [facility]               Action/Plan: From home with father. Chart reviewed and CM following for DC needs.  Expected Discharge Date:   (unknown)               Expected Discharge Plan:  Home/Self Care  In-House Referral:     Discharge planning Services  CM Consult  Post Acute Care Choice:    Choice offered to:     DME Arranged:    DME Agency:     HH Arranged:    HH Agency:     Status of Service:  In process, will continue to follow  If discussed at Long Length of Stay Meetings, dates discussed:    Additional CommentsBartholome Bill:  Dimetri Armitage H, RN 12/10/2015, 1:26 PM  [phone]

## 2015-12-10 NOTE — Discharge Summary (Signed)
Physician Discharge Summary  Malik PortHoward E Earnhardt WUJ:[id number] DOB: 11/20/1975 DOA: 12/05/2015  PCP: Dorrene GermanEdwin A Avbuere, MD  Admit date: 12/05/2015  Discharge date: 12/10/2015  Discharge Diagnoses:  Active Problems:   Sickle cell anemia with pain [facility](HCC)   Discharge Condition: Stable  Disposition:  Follow-up Information    Dorrene GermanEdwin A Avbuere, MD. Call in 1 week(s).   Specialty:  Internal Medicine Contact information: [address] [place] [address] [phone]          Diet: Regular  Wt Readings from Last 3 Encounters:  12/10/15 181 lb 3.5 oz (82.2 kg)  10/19/15 174 lb 14.4 oz (79.3 kg)  08/13/15 175 lb (79.4 kg)   History of present illness:  [name] is a 40 y.o. male with medical history significant of Sickle Cell [place] disease, avascular necrosis, acute chest syndrome. Patient presents for management of sickle cell related pain crisis. Symptoms started about 3 days ago and have gradually worsened. He has been using his home regimen of MS Contin 60mg  BID and morphine IR 30mg  BID prn which have not helped. Pain is described as sharp and located around his right rib cage with some pain in his right shoulder. No associated dyspnea, cough or fevers. No nausea or vomiting.  ED Course: Vitals were wnl and he was breathing on room air. Chest x-ray significant for no acute pathology. Patient received Toradol and two doses of dilaudid.  Hospital Course:  Patient was admitted for sickle cell pain crisis and managed with IV Dilaudid via PCA and clinician assisted doses, IV Toradol and IV Fluid. Pain intensity slowly improved from 9 to baseline of 6, patient ambulating well without assistance or concern. Patient will call his PCP's office in the morning [facility]. He will continue his home pain and other medications per PCP. At the time of discharge, patient's vital signs were all within normal limits, no fever, no SOB, pain at baseline, Hb at  baseline. He was discharged home in a hemodynamically stable condition.   . Anemia of chronic disease: Hemoglobin remained at baseline during hospitalization.   . Tobacco abuse: Dimas AguasHoward was counseled on the dangers of tobacco use, and was advised to quit. Reviewed strategies to maximize success, including removing cigarettes and smoking materials from environment, stress management and support of family/friends.  . Leukocytosis: Patient had a mild leukocytosis without any evidence of infection.   . Constipation: The patient had constipation for most of his hospitalization. He was treated on a daily basis with a gut motility agent and also received multiple laxatives. He finally had a bowel movement yesterday.   Discharge Exam: Vitals:   12/10/15 1000 12/10/15 1256  BP: (!) 117/98   Pulse: 87   Resp: 14 12  Temp: 98.2 F (36.8 C)    Vitals:   12/10/15 0628 12/10/15 0800 12/10/15 1000 12/10/15 1256  BP: 124/83  (!) 117/98   Pulse: 75  87   Resp: 14 16 14 12   Temp: 97.5 F (36.4 C)  98.2 F (36.8 C)   TempSrc: Oral  Oral   SpO2: 96% 96% 90% 96%  Weight: 181 lb 3.5 oz (82.2 kg)     Height:        General appearance : Awake, alert, not in any distress. Speech Clear. Not toxic looking HEENT: Atraumatic and Normocephalic, pupils equally reactive to light and accomodation Neck: Supple, no JVD. No cervical lymphadenopathy.  Chest: Good air entry bilaterally, no added sounds  CVS: S1 S2 regular, no murmurs.  Abdomen: Bowel sounds present, Non tender and not distended with no gaurding, rigidity or rebound. Extremities: B/L Lower Ext shows no edema, both legs are warm to touch Neurology: Awake alert, and oriented X 3, CN II-XII intact, Non focal Skin: No Rash  Discharge Instructions  Discharge Instructions    Diet - low sodium heart healthy    Complete by:  As directed   Increase activity slowly    Complete by:  As directed       Medication List    TAKE these medications    cyclobenzaprine 10 MG tablet Commonly known as:  FLEXERIL Take 10 mg by mouth at bedtime.   folic acid 1 MG tablet Commonly known as:  FOLVITE Take 1 mg by mouth daily.   ibuprofen 800 MG tablet Commonly known as:  ADVIL,MOTRIN Take 800 mg by mouth 3 (three) times daily as needed (PAIN).   morphine 30 MG tablet Commonly known as:  MSIR Take 30 mg by mouth 2 (two) times daily as needed for severe pain (pain). What changed:  Another medication with the same name was changed. Make sure you understand how and when to take each.   morphine 60 MG 12 hr tablet Commonly known as:  MS CONTIN Take 1 tablet (60 mg total) by mouth 2 (two) times daily. What changed:  when to take this  reasons to take this   promethazine 25 MG tablet Commonly known as:  PHENERGAN Take 25 mg by mouth every 6 (six) hours as needed for nausea (nausea).        The results of significant diagnostics from this hospitalization (including imaging, microbiology, ancillary and laboratory) are listed below for reference.    Significant Diagnostic Studies: Dg Chest 2 View  Result Date: 12/05/2015 CLINICAL DATA:  Sickle cell crisis started about 3 days ago. SOB. Weakness. Body aches. EXAM: CHEST  2 VIEW COMPARISON:  10/19/2015 FINDINGS: Cardiac silhouette is normal in size. Normal mediastinal and hilar contours. Areas of lung scarring are stable. There is no evidence of pneumonia or pulmonary edema. No pleural effusion or pneumothorax. Lungs are mildly hyperexpanded. Skeletal structures are unremarkable. IMPRESSION: No acute cardiopulmonary disease. Electronically Signed   By: Amie Portland M.D.   On: 12/05/2015 13:21    Microbiology: No results found for this or any previous visit (from the past 240 hour(s)).   Labs: Basic Metabolic Panel:  Recent Labs Lab 12/05/15 0924 12/09/15 0427  NA 138 138  K 3.7 4.4  CL 105 103  CO2 26 32  GLUCOSE 106* 115*  BUN 14 12  CREATININE 0.81 0.75  CALCIUM 9.1 8.8*    Liver Function Tests:  Recent Labs Lab 12/05/15 0924 12/09/15 0427  AST 25 29  ALT 14* 17  ALKPHOS 79 72  BILITOT 1.7* 1.9*  PROT 7.9 7.5  ALBUMIN 4.3 4.2   No results for input(s): LIPASE, AMYLASE in the last 168 hours. No results for input(s): AMMONIA in the last 168 hours. CBC:  Recent Labs Lab 12/05/15 0924 12/06/15 0343 12/09/15 0427  WBC 14.2* 12.0* 10.0  NEUTROABS 9.0* 6.3 5.3  HGB 11.0* 10.0* 8.9*  HCT 29.9* 27.5* 24.1*  MCV 90.3 90.2 89.6  PLT 342 298 250   Cardiac Enzymes: No results for input(s): CKTOTAL, CKMB, CKMBINDEX, TROPONINI in the last 168 hours. BNP: Invalid input(s): POCBNP CBG: No results for input(s): GLUCAP in the last 168 hours.  Time coordinating discharge: 50 minutes  Signed:  Kayzen Kendzierski,  Saeed Toren  [facility] 12/10/2015, 2:24 PM

## 2016-01-02 ENCOUNTER — Encounter (HOSPITAL_COMMUNITY): Payer: Self-pay | Admitting: *Deleted

## 2016-01-02 ENCOUNTER — Inpatient Hospital Stay (HOSPITAL_COMMUNITY)
Admission: EM | Admit: 2016-01-02 | Discharge: 2016-01-07 | DRG: 812 | Disposition: A | Discharge status: Home / Self Care | Payer: Medicare Other | Attending: Internal Medicine | Admitting: Internal Medicine

## 2016-01-02 DIAGNOSIS — D638 Anemia in other chronic diseases classified elsewhere: Secondary | ICD-10-CM | POA: Diagnosis not present

## 2016-01-02 DIAGNOSIS — D5700 Hb-SS disease with crisis, unspecified: Secondary | ICD-10-CM

## 2016-01-02 DIAGNOSIS — F17210 Nicotine dependence, cigarettes, uncomplicated: Secondary | ICD-10-CM | POA: Diagnosis present

## 2016-01-02 DIAGNOSIS — G8929 Other chronic pain: Secondary | ICD-10-CM | POA: Diagnosis present

## 2016-01-02 DIAGNOSIS — D72829 Elevated white blood cell count, unspecified: Secondary | ICD-10-CM | POA: Diagnosis present

## 2016-01-02 DIAGNOSIS — K5900 Constipation, unspecified: Secondary | ICD-10-CM | POA: Diagnosis not present

## 2016-01-02 DIAGNOSIS — D57219 Sickle-cell/Hb-C disease with crisis, unspecified: Secondary | ICD-10-CM | POA: Diagnosis present

## 2016-01-02 DIAGNOSIS — G894 Chronic pain syndrome: Secondary | ICD-10-CM | POA: Diagnosis not present

## 2016-01-02 DIAGNOSIS — Z96641 Presence of right artificial hip joint: Secondary | ICD-10-CM | POA: Diagnosis present

## 2016-01-02 LAB — CBC WITH DIFFERENTIAL/PLATELET
BASOS ABS: 0.1 10*3/uL (ref 0.0–0.1)
BASOS PCT: 0 %
EOS ABS: 0.6 10*3/uL (ref 0.0–0.7)
Eosinophils Relative: 4 %
HCT: 29.9 % — ABNORMAL LOW (ref 39.0–52.0)
Hemoglobin: 10.8 g/dL — ABNORMAL LOW (ref 13.0–17.0)
Lymphocytes Relative: 17 %
Lymphs Abs: 2.7 10*3/uL (ref 0.7–4.0)
MCH: 32.7 pg (ref 26.0–34.0)
MCHC: 36.1 g/dL — AB (ref 30.0–36.0)
MCV: 90.6 fL (ref 78.0–100.0)
MONO ABS: 1.1 10*3/uL — AB (ref 0.1–1.0)
MONOS PCT: 7 %
NEUTROS PCT: 72 %
Neutro Abs: 11.1 10*3/uL — ABNORMAL HIGH (ref 1.7–7.7)
Platelets: 315 10*3/uL (ref 150–400)
RBC: 3.3 MIL/uL — ABNORMAL LOW (ref 4.22–5.81)
RDW: 14.3 % (ref 11.5–15.5)
WBC: 15.4 10*3/uL — ABNORMAL HIGH (ref 4.0–10.5)

## 2016-01-02 LAB — COMPREHENSIVE METABOLIC PANEL
ALK PHOS: 75 U/L (ref 38–126)
ALT: 12 U/L — AB (ref 17–63)
ANION GAP: 9 (ref 5–15)
AST: 20 U/L (ref 15–41)
Albumin: 4.6 g/dL (ref 3.5–5.0)
BILIRUBIN TOTAL: 1.9 mg/dL — AB (ref 0.3–1.2)
BUN: 14 mg/dL (ref 6–20)
CALCIUM: 9.4 mg/dL (ref 8.9–10.3)
CO2: 24 mmol/L (ref 22–32)
CREATININE: 0.77 mg/dL (ref 0.61–1.24)
Chloride: 106 mmol/L (ref 101–111)
GFR calc non Af Amer: >60 mL/min (ref >60)
GLUCOSE: 87 mg/dL (ref 65–99)
Potassium: 3.6 mmol/L (ref 3.5–5.1)
SODIUM: 139 mmol/L (ref 135–145)
TOTAL PROTEIN: 8.3 g/dL — AB (ref 6.5–8.1)

## 2016-01-02 LAB — RETICULOCYTES
RBC.: 3.3 MIL/uL — AB (ref 4.22–5.81)
RETIC COUNT ABSOLUTE: 99 10*3/uL (ref 19.0–186.0)
RETIC CT PCT: 3 % (ref 0.4–3.1)

## 2016-01-02 MED ORDER — HYDROMORPHONE HCL 2 MG/ML IJ SOLN
2.0000 mg | INTRAMUSCULAR | Status: DC | PRN
  Administered 2016-01-03 (×2): 2 mg via INTRAVENOUS
  Filled 2016-01-02: qty 1

## 2016-01-02 MED ORDER — DIPHENHYDRAMINE HCL 50 MG/ML IJ SOLN
25.0000 mg | Freq: Once | INTRAMUSCULAR | Status: AC
  Administered 2016-01-02: 25 mg via INTRAVENOUS
  Filled 2016-01-02: qty 1

## 2016-01-02 MED ORDER — DEXTROSE-NACL 5-0.45 % IV SOLN
INTRAVENOUS | Status: DC
  Administered 2016-01-02: 19:00:00 via INTRAVENOUS
  Administered 2016-01-02: 125 mL/h via INTRAVENOUS
  Administered 2016-01-03 – 2016-01-05 (×7): via INTRAVENOUS

## 2016-01-02 MED ORDER — FOLIC ACID 1 MG PO TABS
1.0000 mg | ORAL_TABLET | Freq: Every day | ORAL | Status: DC
  Administered 2016-01-02 – 2016-01-07 (×6): 1 mg via ORAL
  Filled 2016-01-02 (×6): qty 1

## 2016-01-02 MED ORDER — KETOROLAC TROMETHAMINE 15 MG/ML IJ SOLN
15.0000 mg | Freq: Once | INTRAMUSCULAR | Status: AC
  Administered 2016-01-02: 15 mg via INTRAVENOUS
  Filled 2016-01-02: qty 1

## 2016-01-02 MED ORDER — HYDROMORPHONE HCL 2 MG/ML IJ SOLN
2.5000 mg | Freq: Once | INTRAMUSCULAR | Status: AC
  Administered 2016-01-02: 2.5 mg via INTRAVENOUS
  Filled 2016-01-02: qty 2

## 2016-01-02 MED ORDER — CYCLOBENZAPRINE HCL 10 MG PO TABS
10.0000 mg | ORAL_TABLET | Freq: Every day | ORAL | Status: DC
  Administered 2016-01-02 – 2016-01-06 (×5): 10 mg via ORAL
  Filled 2016-01-02 (×5): qty 1

## 2016-01-02 MED ORDER — HYDROMORPHONE 1 MG/ML IV SOLN
INTRAVENOUS | Status: DC
  Administered 2016-01-02: 9.6 mg via INTRAVENOUS
  Administered 2016-01-02: 15:00:00 via INTRAVENOUS
  Administered 2016-01-03: 4.5 mg via INTRAVENOUS
  Administered 2016-01-03: 2.4 mg via INTRAVENOUS
  Administered 2016-01-03: 3 mg via INTRAVENOUS
  Administered 2016-01-03: 09:00:00 via INTRAVENOUS
  Administered 2016-01-03: 3 mg via INTRAVENOUS
  Administered 2016-01-03: 5.4 mg via INTRAVENOUS
  Administered 2016-01-03: 6 mg via INTRAVENOUS
  Administered 2016-01-03: 9.6 mg via INTRAVENOUS
  Administered 2016-01-04: 4.8 mg via INTRAVENOUS
  Administered 2016-01-04: 02:00:00 via INTRAVENOUS
  Administered 2016-01-04: 8.4 mg via INTRAVENOUS
  Filled 2016-01-02 (×3): qty 25

## 2016-01-02 MED ORDER — KCL IN DEXTROSE-NACL 20-5-0.45 MEQ/L-%-% IV SOLN
Freq: Once | INTRAVENOUS | Status: DC
  Filled 2016-01-02: qty 1000

## 2016-01-02 MED ORDER — POLYETHYLENE GLYCOL 3350 17 G PO PACK
17.0000 g | PACK | Freq: Every day | ORAL | Status: DC | PRN
  Administered 2016-01-02: 17 g via ORAL
  Filled 2016-01-02: qty 1

## 2016-01-02 MED ORDER — SODIUM CHLORIDE 0.9 % IV SOLN
25.0000 mg | INTRAVENOUS | Status: DC | PRN
  Administered 2016-01-02 – 2016-01-04 (×4): 25 mg via INTRAVENOUS
  Filled 2016-01-02 (×9): qty 0.5

## 2016-01-02 MED ORDER — SODIUM CHLORIDE 0.9% FLUSH: 9.0000 mL | INTRAVENOUS | Status: DC | PRN

## 2016-01-02 MED ORDER — MORPHINE SULFATE ER 30 MG PO TBCR
60.0000 mg | EXTENDED_RELEASE_TABLET | Freq: Two times a day (BID) | ORAL | Status: DC
  Administered 2016-01-02 – 2016-01-07 (×10): 60 mg via ORAL
  Filled 2016-01-02 (×10): qty 2

## 2016-01-02 MED ORDER — NALOXONE HCL 0.4 MG/ML IJ SOLN: 0.4000 mg | INTRAMUSCULAR | Status: DC | PRN

## 2016-01-02 MED ORDER — PROMETHAZINE HCL 12.5 MG RE SUPP
12.5000 mg | RECTAL | Status: DC | PRN
  Filled 2016-01-02: qty 2

## 2016-01-02 MED ORDER — SENNOSIDES-DOCUSATE SODIUM 8.6-50 MG PO TABS
1.0000 | ORAL_TABLET | Freq: Two times a day (BID) | ORAL | Status: DC
  Administered 2016-01-02 – 2016-01-07 (×10): 1 via ORAL
  Filled 2016-01-02 (×10): qty 1

## 2016-01-02 MED ORDER — HYDROMORPHONE HCL 2 MG/ML IJ SOLN
2.0000 mg | INTRAMUSCULAR | Status: AC
  Administered 2016-01-02 – 2016-01-03 (×8): 2 mg via INTRAVENOUS
  Filled 2016-01-02 (×9): qty 1

## 2016-01-02 MED ORDER — ENOXAPARIN SODIUM 40 MG/0.4ML ~~LOC~~ SOLN
40.0000 mg | SUBCUTANEOUS | Status: DC
  Filled 2016-01-02 (×4): qty 0.4

## 2016-01-02 MED ORDER — MORPHINE SULFATE ER 30 MG PO TBCR: 60.0000 mg | EXTENDED_RELEASE_TABLET | Freq: Two times a day (BID) | ORAL | Status: DC

## 2016-01-02 MED ORDER — ONDANSETRON HCL 4 MG/2ML IJ SOLN
4.0000 mg | Freq: Once | INTRAMUSCULAR | Status: AC
  Administered 2016-01-02: 4 mg via INTRAVENOUS
  Filled 2016-01-02: qty 2

## 2016-01-02 MED ORDER — DIPHENHYDRAMINE HCL 25 MG PO CAPS
25.0000 mg | ORAL_CAPSULE | ORAL | Status: DC | PRN
  Filled 2016-01-02: qty 2

## 2016-01-02 MED ORDER — HYDROMORPHONE HCL 2 MG/ML IJ SOLN
2.0000 mg | INTRAMUSCULAR | Status: AC
  Administered 2016-01-02 (×3): 2 mg via INTRAVENOUS
  Filled 2016-01-02 (×3): qty 1

## 2016-01-02 MED ORDER — KETOROLAC TROMETHAMINE 30 MG/ML IJ SOLN
30.0000 mg | Freq: Four times a day (QID) | INTRAMUSCULAR | Status: AC
  Administered 2016-01-02 – 2016-01-07 (×20): 30 mg via INTRAVENOUS
  Filled 2016-01-02 (×20): qty 1

## 2016-01-02 MED ORDER — PROMETHAZINE HCL 25 MG PO TABS
25.0000 mg | ORAL_TABLET | ORAL | Status: DC | PRN
  Administered 2016-01-02 – 2016-01-06 (×11): 25 mg via ORAL
  Filled 2016-01-02 (×11): qty 1

## 2016-01-02 NOTE — ED Triage Notes (Signed)
Patient states he has had back pain x2 days and his MS Contin 60 mg long acting and 30 mg IR have not relieved his pain.  Pain is not worse with movement.  Patient denies hematuria and dysuria.  Patient denies N/V/D and fever.

## 2016-01-02 NOTE — ED Notes (Signed)
Called floor, they were just made aware of admission.  They'll call back.

## 2016-01-02 NOTE — Progress Notes (Signed)
Attempted to call to get report from RN -- on hold, no answer.  Will attempt to call back.

## 2016-01-02 NOTE — ED Provider Notes (Signed)
[facility]-EMERGENCY DEPT Provider Note   CSN: [id number] Arrival date & time: 01/02/16  0849     History   Chief Complaint Chief Complaint  Patient presents with  . Back Pain    HPI [name] is a 40 y.o. male.  HPI Patient presents with neck pain. This episode has been present for about 3 days, worse over the past 24 hours. Patient has a notable history of sickle cell disease, typically takes long-acting narcotic for pain control. Pain is in a typical distribution for him, mid thoracic region, sore, severe, and has not been controlled with narcotics per No new dyspnea, chest pain, fever, chills.  Past Medical History:  Diagnosis Date  . Avascular necrosis of hip (HCC)    bilateral  . Avascular necrosis of hip, left (HCC) 08/27/2011  . Blood transfusion   . Infection of bone, shoulder region (HCC)    left shoulder  . Pneumonia   . Sickle cell crisis [facility](HCC)     Patient Active Problem List   Diagnosis Date Noted  . Rib pain on right side   . Cough   . 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 Sickle cell anemia (HCC) 08/13/2015  . Sickle cell disease with crisis (HCC) 05/25/2015  . Chest pain 02/08/2015  . CAP (community acquired pneumonia) 02/08/2015  . Sickle cell crisis acute chest syndrome (HCC) 02/08/2015  . HCAP (healthcare-associated pneumonia) 02/08/2015  . Shoulder pain, right 02/08/2015  . Numbness and tingling in right hand   . Hemoglobin [place] with crisis (HCC) 10/13/2014  . Sickle cell anemia with pain (HCC) 05/31/2014  . Sickle cell crisis (HCC) 10/24/2013  . Sickle cell disease, type [place] (HCC) 10/18/2012  . Sickle cell pain crisis (HCC) 10/13/2012  . History of tobacco abuse 10/13/2012  . Hypokalemia 05/02/2012  . Leukocytosis 05/02/2012  . Anemia 11/08/2011  . Avascular necrosis of hip, left (HCC) 08/27/2011  . Elevated brain natriuretic peptide (BNP) level 08/25/2011  . Acute chest syndrome (HCC) 05/15/2011  . Tobacco abuse 05/15/2011  .  Constipation 05/14/2011  . Sickle cell anemia with crisis (HCC) 05/13/2011    Past Surgical History:  Procedure Laterality Date  . BONE GRAFT HIP ILIAC CREST    . JOINT REPLACEMENT  2006   right total hip arthroplasty  . Orif right hip fracture  1995       Home Medications    Prior to Admission medications   Medication Sig Start Date End Date Taking? Authorizing Provider  cyclobenzaprine (FLEXERIL) 10 MG tablet Take 10 mg by mouth at bedtime.   Yes Historical Provider, MD  folic acid (FOLVITE) 1 MG tablet Take 1 mg by mouth daily.   Yes Historical Provider, MD  ibuprofen (ADVIL,MOTRIN) 800 MG tablet Take 800 mg by mouth 3 (three) times daily as needed (PAIN).   Yes Historical Provider, MD  morphine (KADIAN) 60 MG 24 hr capsule Take 60 mg by mouth daily.   Yes Historical Provider, MD  morphine (MSIR) 30 MG tablet Take 30 mg by mouth daily as needed for severe pain (pain).    Yes Historical Provider, MD  promethazine (PHENERGAN) 25 MG tablet Take 25 mg by mouth every 6 (six) hours as needed for nausea (nausea).    Yes Historical Provider, MD  morphine (MS CONTIN) 60 MG 12 hr tablet Take 1 tablet (60 mg total) by mouth 2 (two) times daily. Patient not taking: Reported on 01/02/2016 11/20/11   Grayce SessionsMichelle P Edwards, NP    Family  History Family History  Problem Relation Age of Onset  . Adopted: Yes    Social History Social History  Substance Use Topics  . Smoking status: Current Some Day Smoker    Packs/day: 0.50    Types: Cigarettes    Last attempt to quit: 01/27/2012  . Smokeless tobacco: Never Used  . Alcohol use No     Allergies   Review of patient's allergies indicates no known allergies.   Review of Systems Review of Systems  Constitutional:       Per HPI, otherwise negative  HENT:       Per HPI, otherwise negative  Respiratory:       Per HPI, otherwise negative  Cardiovascular:       Per HPI, otherwise negative  Gastrointestinal: Negative for vomiting.    Endocrine:       Negative aside from HPI  Genitourinary:       Neg aside from HPI   Musculoskeletal:       Per HPI, otherwise negative  Skin: Negative.   Allergic/Immunologic: Positive for immunocompromised state.  Neurological: Negative for syncope.     Physical Exam Updated Vital Signs BP 140/91 (BP Location: Left Arm)   Pulse 67   Temp 98 F (36.7 C) (Oral)   Resp 17   Ht 6\' 2"  (1.88 m)   Wt 180 lb (81.6 kg)   SpO2 97%   BMI 23.11 kg/m   Physical Exam  Constitutional: He is oriented to person, place, and time. He appears well-developed. No distress.  HENT:  Head: Normocephalic and atraumatic.  Eyes: Conjunctivae and EOM are normal.  Cardiovascular: Normal rate and regular rhythm.   Pulmonary/Chest: Effort normal. No stridor. No respiratory distress.  Abdominal: He exhibits no distension.  Musculoskeletal: He exhibits no edema.  Neurological: He is alert and oriented to person, place, and time.  Skin: Skin is warm and dry.  Psychiatric: He has a normal mood and affect.  Nursing note and vitals reviewed.    ED Treatments / Results  Labs (all labs ordered are listed, but only abnormal results are displayed) Labs Reviewed  COMPREHENSIVE METABOLIC PANEL - Abnormal; Notable for the following:       Result Value   Total Protein 8.3 (*)    ALT 12 (*)    Total Bilirubin 1.9 (*)    All other components within normal limits  CBC WITH DIFFERENTIAL/PLATELET - Abnormal; Notable for the following:    WBC 15.4 (*)    RBC 3.30 (*)    Hemoglobin 10.8 (*)    HCT 29.9 (*)    MCHC 36.1 (*)    Neutro Abs 11.1 (*)    Monocytes Absolute 1.1 (*)    All other components within normal limits  RETICULOCYTES - Abnormal; Notable for the following:    RBC. 3.30 (*)    All other components within normal limits     Procedures Procedures (including critical care time)  Medications Ordered in ED Medications  dextrose 5 %-0.45 % sodium chloride infusion (125 mL/hr Intravenous  New Bag/Given 01/02/16 1046)  HYDROmorphone (DILAUDID) injection 2 mg (2 mg Intravenous Given 01/02/16 1230)  diphenhydrAMINE (BENADRYL) injection 25 mg (25 mg Intravenous Given 01/02/16 1052)  ondansetron (ZOFRAN) injection 4 mg (4 mg Intravenous Given 01/02/16 1052)  ketorolac (TORADOL) 15 MG/ML injection 15 mg (15 mg Intravenous Given 01/02/16 1229)     Initial Impression / Assessment and Plan / ED Course  I have reviewed the triage vital signs and the  nursing notes.  Pertinent labs & imaging results that were available during my care of the patient were reviewed by me and considered in my medical decision making (see chart for details).  Clinical Course  Comment By Time  Following 3 doses of IV narcotic patient had minimal change in his pain.Patient's case discussed with sickle cell clinic for transfer Gerhard Munch, MD 09/07 1222    Update  Patient will be admitted to our sickle cell team Final Clinical Impressions(s) / ED Diagnoses   Patient was sickle cell disease presents with ongoing pain in his back. Here the patient's vitals and labs are reassuring, but patient had minimal change in spite of multiple dark doses of narcotics, fluids, anti-inflammatory. Patient was admitted for further evaluation and management.     Gerhard Munch, MD 01/02/16 1336

## 2016-01-02 NOTE — ED Notes (Signed)
Attempted to call report to floor x3 - receiving RN is dealing with a situation with another patient and unable to take report right now.  ED Charge Nurse advised of situation.

## 2016-01-03 DIAGNOSIS — G894 Chronic pain syndrome: Secondary | ICD-10-CM

## 2016-01-03 DIAGNOSIS — K5900 Constipation, unspecified: Secondary | ICD-10-CM

## 2016-01-03 MED ORDER — HYDROMORPHONE HCL 2 MG/ML IJ SOLN
2.0000 mg | INTRAMUSCULAR | Status: DC
  Administered 2016-01-03 – 2016-01-05 (×20): 2 mg via INTRAVENOUS
  Filled 2016-01-03 (×21): qty 1

## 2016-01-03 MED ORDER — LACTULOSE 10 GM/15ML PO SOLN
30.0000 g | Freq: Every day | ORAL | Status: DC | PRN
  Administered 2016-01-03 – 2016-01-05 (×3): 30 g via ORAL
  Administered 2016-01-06: 15 g via ORAL
  Filled 2016-01-03 (×5): qty 45

## 2016-01-03 NOTE — H&P (Signed)
Hospital Admission Note Date: 01/03/2016  Patient name: [name] Medical record number: [id number] Date of birth: 1975-05-14 Age: 40 y.o. Gender: male PCP: Dorrene GermanEdwin A Avbuere, MD  Attending physician: Altha HarmMichelle A Leeroy Lovings, MD  Chief Complaint:Pain in back and legs x 5 days  History of Present Illness: [name] is an opiate tolerant patient who is familiar to me from previous hospitalizations. He reports that the increase in his pin started about 5 days ago and was localized to back and legs. He was managing at home with his oral analgesics until last night when the pain escalated to a level of 10/10 and was unresponsive to the oral medications. He describes th pain as throbbing and intermittently sharp, non radiating and not associated with any other symptoms. The pain is currently 10/10 despite 3 doses of Dilaudid and 1 dose of Toradol.   In the ED he is afebrile he had received 3 doses of Dilaudid (last dose 20 minutes ago), one dose of Toradol and D5.45 at 125 ml/hr. He is still having a high intensity of pain and I am asked ot admit him for sickle cell crisis.   Scheduled Meds: . cyclobenzaprine  10 mg Oral QHS  . enoxaparin (LOVENOX) injection  40 mg Subcutaneous Q24H  . folic acid  1 mg Oral Daily  . HYDROmorphone   Intravenous Q4H  .  HYDROmorphone (DILAUDID) injection  2 mg Intravenous Q2H  . ketorolac  30 mg Intravenous Q6H  . morphine  60 mg Oral BID  . senna-docusate  1 tablet Oral BID   Continuous Infusions: . dextrose 5 % and 0.45% NaCl 125 mL/hr at 01/03/16 0315   PRN Meds:.diphenhydrAMINE **OR** diphenhydrAMINE (BENADRYL) IVPB(SICKLE CELL ONLY), HYDROmorphone (DILAUDID) injection **FOLLOWED BY** HYDROmorphone (DILAUDID) injection, naloxone **AND** sodium chloride flush, polyethylene glycol, promethazine **OR** promethazine Allergies: Review of patient's allergies indicates no known allergies. Past Medical History:  Diagnosis Date  . Avascular necrosis of hip (HCC)    bilateral  . Avascular necrosis of hip, left (HCC) 08/27/2011  . Blood transfusion   . Infection of bone, shoulder region (HCC)    left shoulder  . Pneumonia   . Sickle cell crisis [facility](HCC)    Past Surgical History:  Procedure Laterality Date  . BONE GRAFT HIP ILIAC CREST    . JOINT REPLACEMENT  2006   right total hip arthroplasty  . Orif right hip fracture  1995   Family History  Problem Relation Age of Onset  . Adopted: Yes   Social History   Social History  . Marital status: Single    Spouse name: N/A  . Number of children: N/A  . Years of education: N/A   Occupational History  . Not on file.   Social History Main Topics  . Smoking status: Current Some Day Smoker    Packs/day: 0.50    Types: Cigarettes    Last attempt to quit: 01/27/2012  . Smokeless tobacco: Never Used  . Alcohol use No  . Drug use: No  . Sexual activity: No   Other Topics Concern  . Not on file   Social History Narrative   On disability.  No assist devices.  No home health services.  Lives with his father.     Review of Systems: Pertinent items noted in HPI and remainder of comprehensive ROS otherwise negative. Physical Exam: General: Alert, awake, oriented x3, in no acute distress.  HEENT: [place]/AT PEERL, EOMI Neck: Trachea midline,  no masses, no thyromegal,y no JVD, no carotid  bruit OROPHARYNX:  Moist, No exudate/ erythema/lesions.  Heart: Regular rate and rhythm, without murmurs, rubs, gallops, PMI non-displaced, no heaves or thrills on palpation.  Lungs: Clear to auscultation, no wheezing or rhonchi noted. No increased vocal fremitus resonant to percussion  Abdomen: Soft, nontender, nondistended, positive bowel sounds, no masses no hepatosplenomegaly noted..  Neuro: No focal neurological deficits noted cranial nerves II through XII grossly intact. DTRs 2+ bilaterally upper and lower extremities. Strength 5 out of 5 in bilateral upper and lower extremities. Musculoskeletal: No warm swelling or  erythema around joints, no spinal tenderness noted. Psychiatric: Patient alert and oriented x3, good insight and cognition, good recent to remote recall. Lymph node survey: No cervical axillary or inguinal lymphadenopathy noted.  Lab results:  Recent Labs  01/02/16 1030  NA 139  K 3.6  CL 106  CO2 24  GLUCOSE 87  BUN 14  CREATININE 0.77  CALCIUM 9.4    Recent Labs  01/02/16 1030  AST 20  ALT 12*  ALKPHOS 75  BILITOT 1.9*  PROT 8.3*  ALBUMIN 4.6   No results for input(s): LIPASE, AMYLASE in the last 72 hours.  Recent Labs  01/02/16 1030  WBC 15.4*  NEUTROABS 11.1*  HGB 10.8*  HCT 29.9*  MCV 90.6  PLT 315     Recent Labs  01/02/16 1030  RETICCTPCT 3.0   Imaging results:  Dg Chest 2 View  Result Date: 12/05/2015 CLINICAL DATA:  Sickle cell crisis started about 3 days ago. SOB. Weakness. Body aches. EXAM: CHEST  2 VIEW COMPARISON:  10/19/2015 FINDINGS: Cardiac silhouette is normal in size. Normal mediastinal and hilar contours. Areas of lung scarring are stable. There is no evidence of pneumonia or pulmonary edema. No pleural effusion or pneumothorax. Lungs are mildly hyperexpanded. Skeletal structures are unremarkable. IMPRESSION: No acute cardiopulmonary disease. Electronically Signed   By: Amie Portland M.D.   On: 12/05/2015 13:21     Assessment and Plan: 1. Hb [place] with crisis: Will start on Dilaudid PCA, clinician assisted doses and Toradol. Also continue on hypotonic IVF and continue MS Contin. 2. Chronic Pain: Pt on MS Contin. Continue. 3. Anemia of Chronic Disease: Hb at baseline. 4. DVT Prophylaxis: Lovenox.  Time spent 60 minutes   [name] A. 01/03/2016, 10:07 AM

## 2016-01-03 NOTE — Progress Notes (Signed)
SICKLE CELL SERVICE PROGRESS NOTE  [name] WUJ:[id number] DOB: 07/27/1975 DOA: 01/02/2016 PCP: Dorrene German, MD  Assessment/Plan: Active Problems:   Sickle cell pain crisis (HCC)   Sickle-cell/Hb-C disease with crisis (HCC)  1. Hb [place] with crisis: Pt reports pain down to 7/10 as compared to 10/10 yesterday. He has used a total of 30.6 mg with 60/52:demands./deliveries and 18 mg additionally in clinician assisted doses of Dilaudid since admission. Will continue Dilaudid PCA, Clinician assisted doses by IVP, Toradol. Re-assess pain control tomorrow for progression of therapy.  2. Leukocytosis: Pt exhibits a mild leukocytosis without any evidence of infection. Will recheck in 24-48 hours as this is likely a reflection of his crisis.  3. Anemia of chronic disease: Hb at baseline 4. Chronic pain: Pt takes MS Contin at home but this is only prescribed once daily. He states that most of his pain occurs at nightt when he has no coverage overnight for his chronic pain. I have prescribed MS Contin every 12 hours while hospitalized. This is likely a patient who would benefit from [place] whenn available on the market.  5. Constipation: Pt has not had a BM and is requesting Lactulose.    Code Status: Full Code Family Communication: N/A Disposition Plan: Not yet ready for discharge  Kallan Merrick A.  Pager [phone]. If 7PM-7AM, please contact night-coverage.  01/03/2016, 1:58 PM  LOS: 1 day   Interim History: Pt reports pain down to 7/10 as compared to 10/10 yesterday. Pain is localized to back and shoulders. Pain in legs has improved. He has used a total of 30.6 mg with 60/52: demands./deliveries and 18 mg additionally in clinician assisted doses of Dilaudid since admission.  Consultants:  None  Procedures:  None  Antibiotics:  None   Objective: Vitals:   01/03/16 0639 01/03/16 0801 01/03/16 0954 01/03/16 1146  BP: 120/75  120/69   Pulse: 73  66   Resp: 13 12 10 14   Temp:  98 F (36.7 C)  97.6 F (36.4 C)   TempSrc: Oral  Oral   SpO2: 90% 92% 96% 92%  Weight: 80.3 kg (177 lb 0.5 oz)     Height:       Weight change:   Intake/Output Summary (Last 24 hours) at 01/03/16 1358 Last data filed at 01/03/16 1355  Gross per 24 hour  Intake           2787.2 ml  Output             1300 ml  Net           1487.2 ml    General: Alert, awake, oriented x3, in no acute distress.  HEENT: [place]/AT PEERL, EOMI, anicteric Neck: Trachea midline,  no masses, no thyromegal,y no JVD, no carotid bruit OROPHARYNX:  Moist, No exudate/ erythema/lesions.  Heart: Regular rate and rhythm, without murmurs, rubs, gallops, PMI non-displaced, no heaves or thrills on palpation.  Lungs: Clear to auscultation, no wheezing or rhonchi noted. No increased vocal fremitus resonant to percussion  Abdomen: Soft, nontender, nondistended, positive bowel sounds, no masses no hepatosplenomegaly noted..  Neuro: No focal neurological deficits noted cranial nerves II through XII grossly intact.  Strength at baseline in bilateral upper and lower extremities. Musculoskeletal: No warmth swelling or erythema around joints, no spinal tenderness noted. Psychiatric: Patient alert and oriented x3, good insight and cognition, good recent to remote recall.    Data Reviewed: Basic Metabolic Panel:  Recent Labs Lab 01/02/16 1030  NA 139  K  3.6  CL 106  CO2 24  GLUCOSE 87  BUN 14  CREATININE 0.77  CALCIUM 9.4   Liver Function Tests:  Recent Labs Lab 01/02/16 1030  AST 20  ALT 12*  ALKPHOS 75  BILITOT 1.9*  PROT 8.3*  ALBUMIN 4.6   No results for input(s): LIPASE, AMYLASE in the last 168 hours. No results for input(s): AMMONIA in the last 168 hours. CBC:  Recent Labs Lab 01/02/16 1030  WBC 15.4*  NEUTROABS 11.1*  HGB 10.8*  HCT 29.9*  MCV 90.6  PLT 315   Cardiac Enzymes: No results for input(s): CKTOTAL, CKMB, CKMBINDEX, TROPONINI in the last 168 hours. BNP (last 3 results) No  results for input(s): BNP in the last 8760 hours.  ProBNP (last 3 results) No results for input(s): PROBNP in the last 8760 hours.  CBG: No results for input(s): GLUCAP in the last 168 hours.  No results found for this or any previous visit (from the past 240 hour(s)).   Studies: Dg Chest 2 View  Result Date: 12/05/2015 CLINICAL DATA:  Sickle cell crisis started about 3 days ago. SOB. Weakness. Body aches. EXAM: CHEST  2 VIEW COMPARISON:  10/19/2015 FINDINGS: Cardiac silhouette is normal in size. Normal mediastinal and hilar contours. Areas of lung scarring are stable. There is no evidence of pneumonia or pulmonary edema. No pleural effusion or pneumothorax. Lungs are mildly hyperexpanded. Skeletal structures are unremarkable. IMPRESSION: No acute cardiopulmonary disease. Electronically Signed   By: Amie Portlandavid  Ormond M.D.   On: 12/05/2015 13:21    Scheduled Meds: . cyclobenzaprine  10 mg Oral QHS  . enoxaparin (LOVENOX) injection  40 mg Subcutaneous Q24H  . folic acid  1 mg Oral Daily  . HYDROmorphone   Intravenous Q4H  . ketorolac  30 mg Intravenous Q6H  . morphine  60 mg Oral BID  . senna-docusate  1 tablet Oral BID   Continuous Infusions: . dextrose 5 % and 0.45% NaCl 125 mL/hr at 01/03/16 1143    Active Problems:   Sickle cell pain crisis (HCC)   Sickle-cell/Hb-C disease with crisis (HCC)        In excess of 25 minutes spent during this visit. Greater than  50% involved face to face contact with the patient for assessment, counseling and coordination of care.

## 2016-01-03 NOTE — Consult Note (Signed)
   [facility]   01/03/2016  [name] 11-17-1975 [id number]   [facility] due to 4 hospital admissions in the past 6 months. Chart reviewed. Noted Primary Care MD is Dr. Concepcion ElkAvbuere. Nursing confirmed with [name] that Dr. Concepcion ElkAvbuere is his Primary Care MD. Therefore, [facility] is not [facility] due to Dr. Concepcion ElkAvbuere no longer being a [facility]. Will make inpatient RNCM aware.   Raiford NobleAtika Hall, MSN-Ed, RN,BSN [facility] Hospital Liaison [phone]

## 2016-01-04 DIAGNOSIS — D5700 Hb-SS disease with crisis, unspecified: Secondary | ICD-10-CM

## 2016-01-04 MED ORDER — NALOXONE HCL 0.4 MG/ML IJ SOLN: 0.4000 mg | INTRAMUSCULAR | Status: DC | PRN

## 2016-01-04 MED ORDER — SODIUM CHLORIDE 0.9% FLUSH: 9.0000 mL | INTRAVENOUS | Status: DC | PRN

## 2016-01-04 MED ORDER — HYDROMORPHONE 1 MG/ML IV SOLN
INTRAVENOUS | Status: DC
  Administered 2016-01-04: 21:00:00 via INTRAVENOUS
  Administered 2016-01-04: 10.8 mg via INTRAVENOUS
  Administered 2016-01-04: 2.5 mg via INTRAVENOUS
  Administered 2016-01-05: 15 mg via INTRAVENOUS
  Administered 2016-01-05: 9.57 mg via INTRAVENOUS
  Administered 2016-01-05: 5.4 mg via INTRAVENOUS
  Administered 2016-01-05: 7.2 mg via INTRAVENOUS
  Administered 2016-01-05: 2.4 mg via INTRAVENOUS
  Administered 2016-01-05: 8.4 mg via INTRAVENOUS
  Administered 2016-01-06: 5.4 mg via INTRAVENOUS
  Administered 2016-01-06: via INTRAVENOUS
  Administered 2016-01-06: 6 mg via INTRAVENOUS
  Administered 2016-01-06: 4.2 mg via INTRAVENOUS
  Administered 2016-01-06: 17.5 mg via INTRAVENOUS
  Filled 2016-01-04 (×3): qty 25

## 2016-01-04 NOTE — Progress Notes (Signed)
SICKLE CELL SERVICE PROGRESS NOTE  [name] ZOX:[id number] DOB: [birth year] DOA: 01/02/2016 PCP: Dorrene German, MD  Assessment/Plan: Active Problems:   Sickle cell pain crisis (HCC)   Sickle-cell/Hb-C disease with crisis (HCC)  1. Hb [place] with crisis: Pt reports pain down to 7/10 as compared to 10/10 yesterday. He has used a total of 30.6 mg with 60/52:demands./deliveries and 18 mg additionally in clinician assisted doses of Dilaudid since admission. Will continue Dilaudid PCA, Clinician assisted doses by IVP, Toradol. Re-assess pain control tomorrow for progression of therapy.  2. Leukocytosis: Will recheck in 24-48 hours as this is likely a reflection of his crisis.  3. Anemia of chronic disease: Hb at baseline 4. Chronic pain: Pt takes MS Contin at home but this is only prescribed once daily. He states that most of his pain occurs at night when he has no coverage overnight for his chronic pain. I have prescribed MS Contin every 12 hours while hospitalized. He might need to continue same dose at DC. This is likely a patient who would benefit from [place] when available on the market.  5. Constipation: Pt has responded to Lactulose.    Code Status: Full Code Family Communication: N/A Disposition Plan: Not yet ready for discharge  [facility]  Pager [phone]. If 7PM-7AM, please contact night-coverage.  01/04/2016, 9:11 PM  LOS: 2 days   Interim History: Pt reports pain down to 6/10 as compared to 710 yesterday. Pain is localized to back and shoulders. Pain in legs has improved. He has used a total of 32.5 mg with 64/58: demands./deliveries and 18 mg additionally in clinician assisted doses of Dilaudid since admission.  Consultants:  None  Procedures:  None  Antibiotics:  None   Objective: Vitals:   01/04/16 1311 01/04/16 1600 01/04/16 1844 01/04/16 2011  BP: (!) 137/96  131/87   Pulse: 71  67   Resp: 14 14 12 11   Temp: 97.9 F (36.6 C)  98 F (36.7 C)   TempSrc: Oral   Oral   SpO2: 95% 95% 94% 94%  Weight:      Height:       Weight change: 0.752 kg (1 lb 10.5 oz)  Intake/Output Summary (Last 24 hours) at 01/04/16 2111 Last data filed at 01/04/16 1843  Gross per 24 hour  Intake             1230 ml  Output             1001 ml  Net              229 ml    General: Alert, awake, oriented x3, in no acute distress.  HEENT: /AT PEERL, EOMI, anicteric Neck: Trachea midline,  no masses, no thyromegal,y no JVD, no carotid bruit OROPHARYNX:  Moist, No exudate/ erythema/lesions.  Heart: Regular rate and rhythm, without murmurs, rubs, gallops, PMI non-displaced, no heaves or thrills on palpation.  Lungs: Clear to auscultation, no wheezing or rhonchi noted. No increased vocal fremitus resonant to percussion  Abdomen: Soft, nontender, nondistended, positive bowel sounds, no masses no hepatosplenomegaly noted..  Neuro: No focal neurological deficits noted cranial nerves II through XII grossly intact.  Strength at baseline in bilateral upper and lower extremities. Musculoskeletal: No warmth swelling or erythema around joints, no spinal tenderness noted. Psychiatric: Patient alert and oriented x3, good insight and cognition, good recent to remote recall.    Data Reviewed: Basic Metabolic Panel:  Recent Labs Lab 01/02/16 1030  NA 139  K 3.6  CL 106  CO2 24  GLUCOSE 87  BUN 14  CREATININE 0.77  CALCIUM 9.4   Liver Function Tests:  Recent Labs Lab 01/02/16 1030  AST 20  ALT 12*  ALKPHOS 75  BILITOT 1.9*  PROT 8.3*  ALBUMIN 4.6   No results for input(s): LIPASE, AMYLASE in the last 168 hours. No results for input(s): AMMONIA in the last 168 hours. CBC:  Recent Labs Lab 01/02/16 1030  WBC 15.4*  NEUTROABS 11.1*  HGB 10.8*  HCT 29.9*  MCV 90.6  PLT 315   Cardiac Enzymes: No results for input(s): CKTOTAL, CKMB, CKMBINDEX, TROPONINI in the last 168 hours. BNP (last 3 results) No results for input(s): BNP in the last 8760  hours.  ProBNP (last 3 results) No results for input(s): PROBNP in the last 8760 hours.  CBG: No results for input(s): GLUCAP in the last 168 hours.  No results found for this or any previous visit (from the past 240 hour(s)).   Studies: No results found.  Scheduled Meds: . cyclobenzaprine  10 mg Oral QHS  . enoxaparin (LOVENOX) injection  40 mg Subcutaneous Q24H  . folic acid  1 mg Oral Daily  . HYDROmorphone   Intravenous Q4H  .  HYDROmorphone (DILAUDID) injection  2 mg Intravenous Q2H  . ketorolac  30 mg Intravenous Q6H  . morphine  60 mg Oral BID  . senna-docusate  1 tablet Oral BID   Continuous Infusions: . dextrose 5 % and 0.45% NaCl 125 mL/hr at 01/04/16 1412    Active Problems:   Sickle cell pain crisis (HCC)   Sickle-cell/Hb-C disease with crisis (HCC)        In excess of 25 minutes spent during this visit. Greater than  50% involved face to face contact with the patient for assessment, counseling and coordination of care.

## 2016-01-04 NOTE — Progress Notes (Signed)
PHARMACIST - PHYSICIAN COMMUNICATION  DR:   Ashley RoyaltyMatthews CONCERNING: IV to Oral Route Change Policy  RECOMMENDATION: This patient is receiving diphenhydramine by the intravenous route.  Based on criteria approved by the Pharmacy and Therapeutics Committee, intravenous diphenhydramine is being converted to the equivalent oral dose form(s).   DESCRIPTION: These criteria include:  Diphenhydramine is not prescribed to treat or prevent a severe allergic reaction  Diphenhydramine is not prescribed as premedication prior to receiving blood product, biologic medication, antimicrobial, or chemotherapy agent  The patient has tolerated at least one dose of an oral or enteral medication  The patient has no evidence of active gastrointestinal bleeding or impaired GI absorption (gastrectomy, short bowel, patient on TNA or NPO).  The patient is not undergoing procedural sedation   If you have questions about this conversion, please contact the Pharmacy Department  []   [phone]( [phone] )  [facility] []   [phone]( [phone] )  [facility] []   [phone]( [phone] )  [facility] []   [phone]( [phone] )  [facility] [x]   [phone]( [phone] )  [facility]   Dorna LeitzAnh Cason Luffman, PharmD, BCPS 01/04/2016 10:00 AM

## 2016-01-05 LAB — CBC WITH DIFFERENTIAL/PLATELET
BASOS ABS: 0.1 10*3/uL (ref 0.0–0.1)
Basophils Relative: 1 %
EOS PCT: 15 %
Eosinophils Absolute: 2 10*3/uL — ABNORMAL HIGH (ref 0.0–0.7)
HEMATOCRIT: 26.6 % — AB (ref 39.0–52.0)
HEMOGLOBIN: 9.7 g/dL — AB (ref 13.0–17.0)
LYMPHS ABS: 2.7 10*3/uL (ref 0.7–4.0)
LYMPHS PCT: 21 %
MCH: 33.1 pg (ref 26.0–34.0)
MCHC: 36.5 g/dL — ABNORMAL HIGH (ref 30.0–36.0)
MCV: 90.8 fL (ref 78.0–100.0)
Monocytes Absolute: 1.2 10*3/uL — ABNORMAL HIGH (ref 0.1–1.0)
Monocytes Relative: 10 %
NEUTROS ABS: 7.1 10*3/uL (ref 1.7–7.7)
NEUTROS PCT: 53 %
PLATELETS: 245 10*3/uL (ref 150–400)
RBC: 2.93 MIL/uL — AB (ref 4.22–5.81)
RDW: 15.1 % (ref 11.5–15.5)
WBC: 13.1 10*3/uL — AB (ref 4.0–10.5)

## 2016-01-05 LAB — BASIC METABOLIC PANEL
ANION GAP: 7 (ref 5–15)
BUN: 6 mg/dL (ref 6–20)
CHLORIDE: 105 mmol/L (ref 101–111)
CO2: 27 mmol/L (ref 22–32)
CREATININE: 0.66 mg/dL (ref 0.61–1.24)
Calcium: 9 mg/dL (ref 8.9–10.3)
GFR calc Af Amer: >60 mL/min (ref >60)
GLUCOSE: 81 mg/dL (ref 65–99)
POTASSIUM: 4.3 mmol/L (ref 3.5–5.1)
Sodium: 139 mmol/L (ref 135–145)

## 2016-01-05 MED ORDER — HYDROMORPHONE HCL 2 MG/ML IJ SOLN
2.0000 mg | INTRAMUSCULAR | Status: DC | PRN
  Administered 2016-01-05: 2 mg via INTRAVENOUS
  Filled 2016-01-05: qty 1

## 2016-01-05 MED ORDER — MORPHINE SULFATE 30 MG PO TABS
30.0000 mg | ORAL_TABLET | Freq: Every day | ORAL | Status: DC | PRN
  Administered 2016-01-06: 30 mg via ORAL
  Filled 2016-01-05: qty 1

## 2016-01-05 NOTE — Progress Notes (Signed)
SICKLE CELL SERVICE PROGRESS NOTE  [name] WUJ:[id number] DOB: 1975-10-13 DOA: 01/02/2016 PCP: Dorrene GermanEdwin A Avbuere, MD  Assessment/Plan: Active Problems:   Sickle cell pain crisis (HCC)   Sickle-cell/Hb-C disease with crisis (HCC)  1. Hb  with crisis:  Will continue Dilaudid PCA, Clinician assisted doses by IVP, Toradol. Will Dc physician assisted dosing today.  2. Leukocytosis: Will recheck in 24-48 hours as this is likely a reflection of his crisis.  3. Anemia of chronic disease: Hb at baseline 4. Chronic pain: Pt takes MS Contin at home daily now every 12 hours while hospitalized. He might need to continue same dose at DC. This is likely a patient who would benefit from [place] when available on the market.  5. Constipation: Pt has responded to Lactulose.    Code Status: Full Code Family Communication: N/A Disposition Plan: Not yet ready for discharge  [name]  Pager [phone]. If 7PM-7AM, please contact night-coverage.  01/05/2016, 1:03 PM  LOS: 3 days   Interim History: Pt reports pain is at 6/10. Pain is localized to back and shoulders. Pain in legs has improved. He has used a total of 34.7 mg with 66/58: demands./deliveries and 16 mg additionally in clinician assisted doses of Dilaudid since admission.  Consultants:  None  Procedures:  None  Antibiotics:  None   Objective: Vitals:   01/05/16 0555 01/05/16 0817 01/05/16 1022 01/05/16 1246  BP: (!) 152/95  (!) 142/85   Pulse: 75  72   Resp: 17 12 15 11   Temp: 97.6 F (36.4 C)  98.2 F (36.8 C)   TempSrc: Oral  Oral   SpO2: 99% 95% 94% 94%  Weight: 81.8 kg (180 lb 5.4 oz)     Height:       Weight change: -0.6 kg (-1 lb 5.2 oz)  Intake/Output Summary (Last 24 hours) at 01/05/16 1303 Last data filed at 01/05/16 1023  Gross per 24 hour  Intake             1355 ml  Output             3351 ml  Net            -1996 ml    General: Alert, awake, oriented x3, in no acute distress.  HEENT: [place]/AT [place]ERL,  EOMI, anicteric Neck: Trachea midline,  no masses, no thyromegal,y no JVD, no carotid bruit OROPHARYNX:  Moist, No exudate/ erythema/lesions.  Heart: Regular rate and rhythm, without murmurs, rubs, gallops, PMI non-displaced, no heaves or thrills on palpation.  Lungs: Clear to auscultation, no wheezing or rhonchi noted. No increased vocal fremitus resonant to percussion  Abdomen: Soft, nontender, nondistended, positive bowel sounds, no masses no hepatosplenomegaly noted..  Neuro: No focal neurological deficits noted cranial nerves II through XII grossly intact.  Strength at baseline in bilateral upper and lower extremities. Musculoskeletal: No warmth swelling or erythema around joints, no spinal tenderness noted. Psychiatric: Patient alert and oriented x3, good insight and cognition, good recent to remote recall.    Data Reviewed: Basic Metabolic Panel:  Recent Labs Lab 01/02/16 1030 01/05/16 0418  NA 139 139  K 3.6 4.3  CL 106 105  CO2 24 27  GLUCOSE 87 81  BUN 14 6  CREATININE 0.77 0.66  CALCIUM 9.4 9.0   Liver Function Tests:  Recent Labs Lab 01/02/16 1030  AST 20  ALT 12*  ALKPHOS 75  BILITOT 1.9*  PROT 8.3*  ALBUMIN 4.6   No results for input(s): LIPASE, AMYLASE  in the last 168 hours. No results for input(s): AMMONIA in the last 168 hours. CBC:  Recent Labs Lab 01/02/16 1030 01/05/16 0418  WBC 15.4* 13.1*  NEUTROABS 11.1* 7.1  HGB 10.8* 9.7*  HCT 29.9* 26.6*  MCV 90.6 90.8  PLT 315 245   Cardiac Enzymes: No results for input(s): CKTOTAL, CKMB, CKMBINDEX, TROPONINI in the last 168 hours. BNP (last 3 results) No results for input(s): BNP in the last 8760 hours.  ProBNP (last 3 results) No results for input(s): PROBNP in the last 8760 hours.  CBG: No results for input(s): GLUCAP in the last 168 hours.  No results found for this or any previous visit (from the past 240 hour(s)).   Studies: No results found.  Scheduled Meds: . cyclobenzaprine   10 mg Oral QHS  . enoxaparin (LOVENOX) injection  40 mg Subcutaneous Q24H  . folic acid  1 mg Oral Daily  . HYDROmorphone   Intravenous Q4H  . ketorolac  30 mg Intravenous Q6H  . morphine  60 mg Oral BID  . senna-docusate  1 tablet Oral BID   Continuous Infusions: . dextrose 5 % and 0.45% NaCl 125 mL/hr at 01/05/16 1610    Active Problems:   Sickle cell pain crisis (HCC)   Sickle-cell/Hb-C disease with crisis (HCC)        In excess of 25 minutes spent during this visit. Greater than  50% involved face to face contact with the patient for assessment, counseling and coordination of care.

## 2016-01-05 NOTE — Care Management Important Message (Signed)
Important Message  Patient Details  Name: [name] MRN: [id number] Date of Birth: 05/31/1975   Medicare Important Message Given:  Yes    Antony HasteBennett, Ermon Sagan Harris, RN 01/05/2016, 12:42 PM

## 2016-01-06 MED ORDER — MORPHINE SULFATE 30 MG PO TABS
30.0000 mg | ORAL_TABLET | ORAL | Status: DC
  Administered 2016-01-06 – 2016-01-07 (×6): 30 mg via ORAL
  Filled 2016-01-06 (×6): qty 1

## 2016-01-06 MED ORDER — HYDROMORPHONE HCL 2 MG/ML IJ SOLN
2.0000 mg | INTRAMUSCULAR | Status: DC | PRN
  Administered 2016-01-07: 2 mg via INTRAVENOUS
  Filled 2016-01-06: qty 1

## 2016-01-06 NOTE — Progress Notes (Signed)
SICKLE CELL SERVICE PROGRESS NOTE  [name] ZOX:[id number] DOB: July 23, 1975 DOA: 01/02/2016 PCP: Dorrene German, MD  Assessment/Plan: Active Problems:   Sickle cell pain crisis (HCC)   Sickle-cell/Hb-C disease with crisis (HCC)  1. Hb [place] with crisis: Pt reports pain much improved. Will discontinue PCA, schedule immediate acting Morphine and prescribe Dilaudid on an AS NEEDED basis. Continue Toradol. Anticipate discharge home tomorrow.   2. Leukocytosis: Pt exhibits a mild leukocytosis without any evidence of infection. Likely a reflection of his crisis.  3. Anemia of chronic disease: Hb at baseline 4. Chronic pain: Pt takes MS Contin at home but this is only prescribed once daily. He states that most of his pain occurs at nightt when he has no coverage overnight for his chronic pain. I have prescribed MS Contin every 12 hours while hospitalized. This is likely a patient who would benefit from [place] whenn available on the market.  5. Constipation: Pt had a BM yesterday.   Code Status: Full Code Family Communication: Father in room and updated. Disposition Plan: Anticipate discharge home tomorrow  Jkwon Treptow A.  Pager [phone]. If 7PM-7AM, please contact night-coverage.  01/06/2016, 2:07 PM  LOS: 4 days   Interim History: Pt reports pain down to 4-5/10 and localized to ribs and back. He has used a total of 37.8 mg with 67/63: demands./deliveries in the last 24 hours.  Consultants:  None  Procedures:  None  Antibiotics:  None   Objective: Vitals:   01/06/16 0545 01/06/16 0800 01/06/16 1011 01/06/16 1200  BP: (!) 143/96  139/86   Pulse: 83  73   Resp: 13 13 13 12   Temp: 97.8 F (36.6 C)  98 F (36.7 C)   TempSrc: Oral  Oral   SpO2: 96% 91% 92% 97%  Weight: 81.8 kg (180 lb 5.4 oz)     Height:       Weight change: 0 kg (0 lb)  Intake/Output Summary (Last 24 hours) at 01/06/16 1407 Last data filed at 01/06/16 0348  Gross per 24 hour  Intake              1185 ml  Output                0 ml  Net             1185 ml    General: Alert, awake, oriented x3, in no acute distress. Well appearing. HEENT: [place]/AT [place]ER[place], EOMI, anicteric Neck: Trachea midline,  no masses, no thyromegal,y no JVD, no carotid bruit OROPHARYNX:  Moist, No exudate/ erythema/lesions.  Heart: Regular rate and rhythm, without murmurs, rubs, gallops, PMI non-displaced, no heaves or thrills on palpation.  Lungs: Clear to auscultation, no wheezing or rhonchi noted. No increased vocal fremitus resonant to percussion  Abdomen: Soft, nontender, nondistended, positive bowel sounds, no masses no hepatosplenomegaly noted.  Neuro: No focal neurological deficits noted cranial nerves II through XII grossly intact.  Strength at baseline in bilateral upper and lower extremities. Musculoskeletal: No warmth swelling or erythema around joints, no spinal tenderness noted. Psychiatric: Patient alert and oriented x3, good insight and cognition, good recent to remote recall.    Data Reviewed: Basic Metabolic Panel:  Recent Labs Lab 01/02/16 1030 01/05/16 0418  NA 139 139  K 3.6 4.3  CL 106 105  CO2 24 27  GLUCOSE 87 81  BUN 14 6  CREATININE 0.77 0.66  CALCIUM 9.4 9.0   Liver Function Tests:  Recent Labs Lab 01/02/16 1030  AST 20  ALT 12*  ALKPHOS 75  BILITOT 1.9*  PROT 8.3*  ALBUMIN 4.6   No results for input(s): LIPASE, AMYLASE in the last 168 hours. No results for input(s): AMMONIA in the last 168 hours. CBC:  Recent Labs Lab 01/02/16 1030 01/05/16 0418  WBC 15.4* 13.1*  NEUTROABS 11.1* 7.1  HGB 10.8* 9.7*  HCT 29.9* 26.6*  MCV 90.6 90.8  PLT 315 245   Cardiac Enzymes: No results for input(s): CKTOTAL, CKMB, CKMBINDEX, TROPONINI in the last 168 hours. BNP (last 3 results) No results for input(s): BNP in the last 8760 hours.  ProBNP (last 3 results) No results for input(s): PROBNP in the last 8760 hours.  CBG: No results for input(s): GLUCAP in the last  168 hours.  No results found for this or any previous visit (from the past 240 hour(s)).   Studies: No results found.  Scheduled Meds: . cyclobenzaprine  10 mg Oral QHS  . enoxaparin (LOVENOX) injection  40 mg Subcutaneous Q24H  . folic acid  1 mg Oral Daily  . ketorolac  30 mg Intravenous Q6H  . morphine  60 mg Oral BID  . morphine  30 mg Oral Q4H  . senna-docusate  1 tablet Oral BID   Continuous Infusions: . dextrose 5 % and 0.45% NaCl 125 mL/hr at 01/05/16 2305    In excess of 25 minutes spent during this visit. Greater than  50% involved face to face contact with the patient for assessment, counseling and coordination of care.

## 2016-01-07 NOTE — Care Management Note (Signed)
Case Management Note  Patient Details  Name: [name] MRN: [id number] Date of Birth: 12-19-1975  Subjective/Objective:    40 yo admitted with scc               Action/Plan: From home. Chart reviewed and CM following for DC needs.  Expected Discharge Date:   (UNKNOWN)               Expected Discharge Plan:  Home/Self Care  In-House Referral:     Discharge planning Services  CM Consult  Post Acute Care Choice:    Choice offered to:     DME Arranged:    DME Agency:     HH Arranged:    HH Agency:     Status of Service:  In process, will continue to follow  If discussed at Long Length of Stay Meetings, dates discussed:    Additional CommentsBartholome Bill:  Gergory Biello H, RN 01/07/2016, 11:13 AM  [phone]

## 2016-01-07 NOTE — Progress Notes (Signed)
Patient d/c home. Stable. D/c instructions given, teach back utilized, verbalized understanding.

## 2016-01-08 NOTE — Discharge Summary (Signed)
[name] MRN: [id number] DOB/AGE: 1975/10/29 40 y.o.  Admit date: 01/02/2016 Discharge date: 01/08/2016  Primary Care Physician:  Dorrene German, MD   Discharge Diagnoses:   Patient Active Problem List   Diagnosis Date Noted  . Sickle-cell/Hb-C disease with crisis (HCC) 01/02/2016  . Rib pain on right side   . Cough   . 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 Sickle cell anemia (HCC) 08/13/2015  . Sickle cell disease with crisis (HCC) 05/25/2015  . Chest pain 02/08/2015  . CAP (community acquired pneumonia) 02/08/2015  . Sickle cell crisis acute chest syndrome (HCC) 02/08/2015  . HCAP (healthcare-associated pneumonia) 02/08/2015  . Shoulder pain, right 02/08/2015  . Numbness and tingling in right hand   . Hemoglobin [place] with crisis (HCC) 10/13/2014  . Sickle cell anemia with pain (HCC) 05/31/2014  . Sickle cell crisis (HCC) 10/24/2013  . Sickle cell disease, type [place] (HCC) 10/18/2012  . Sickle cell pain crisis (HCC) 10/13/2012  . History of tobacco abuse 10/13/2012  . Hypokalemia 05/02/2012  . Leukocytosis 05/02/2012  . Anemia 11/08/2011  . Avascular necrosis of hip, left (HCC) 08/27/2011  . Elevated brain natriuretic peptide (BNP) level 08/25/2011  . Acute chest syndrome (HCC) 05/15/2011  . Tobacco abuse 05/15/2011  . Constipation 05/14/2011  . Sickle cell anemia with crisis (HCC) 05/13/2011    DISCHARGE MEDICATION:   Medication List    TAKE these medications   cyclobenzaprine 10 MG tablet Commonly known as:  FLEXERIL Take 10 mg by mouth at bedtime.   folic acid 1 MG tablet Commonly known as:  FOLVITE Take 1 mg by mouth daily.   ibuprofen 800 MG tablet Commonly known as:  ADVIL,MOTRIN Take 800 mg by mouth 3 (three) times daily as needed (PAIN).   morphine 30 MG tablet Commonly known as:  MSIR Take 30 mg by mouth daily as needed for severe pain (pain). What changed:  Another medication with the same name was removed. Continue taking this  medication, and follow the directions you see here.   morphine 60 MG 12 hr tablet Commonly known as:  MS CONTIN Take 1 tablet (60 mg total) by mouth 2 (two) times daily. What changed:  Another medication with the same name was removed. Continue taking this medication, and follow the directions you see here.   promethazine 25 MG tablet Commonly known as:  PHENERGAN Take 25 mg by mouth every 6 (six) hours as needed for nausea (nausea).         Consults: None   SIGNIFICANT DIAGNOSTIC STUDIES:  No results found.    No results found for this or any previous visit (from the past 240 hour(s)).  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 He reports that the increase in his pin started about 5 days ago and was localized to back and legs. He was managing at home with his oral analgesics until last night when the pain escalated to a level of 10/10 and was unresponsive to the oral medications. He describes th pain as throbbing and intermittently sharp, non radiating and not associated with any other symptoms. The pain is currently 10/10 despite 3 doses of Dilaudid and 1 dose of Toradol.   In the ED he is afebrile he had received 3 doses of Dilaudid (last dose 20 minutes ago), one dose of Toradol and D5.45 at 125 ml/hr. He is still having a high intensity of pain and I  am asked ot admit him for sickle cell crisis.    Hospital Course:  Present on Admission: . Sickle cell pain crisis [facility](HCC): Pt had an uneventful hospital course. His pain was managed with Dilaudid via PCA, Toradol and IVF. His pain improved and he was transitioned to oral analgesics. His chronic pain management was continued with MS Contin 60 mg BID. He was discharged home on pre-hospital pain regimen of MS Contin and MSIR.     Disposition and Follow-up:  Pt discharged home in good condition and is to follow up with his PMD-Dr. Concepcion ElkAvbuere as scheduled.    Discharge Instructions    Activity as tolerated - No restrictions    Complete by:  As directed    Diet general    Complete by:  As directed       DISCHARGE EXAM:  General: Alert, awake, oriented x3, in no apparent distress.  Vital Signs: BP 128/87, HR 62, T 97.4 F (36.3 C), temperature source Oral, RR 16, height 6\' 2"  (1.88 m), weight 81.8 kg (180 lb 5.4 oz), SpO2 100 %. HEENT: [place]/AT PEERL, EOMI, anicteric Neck: Trachea midline, no masses, no thyromegal,y no JVD, no carotid bruit OROPHARYNX: Moist, No exudate/ erythema/lesions.  Heart: Regular rate and rhythm, without murmurs, rubs, gallops or S3. PMI non-displaced. Exam reveals no decreased pulses. Pulmonary/Chest: Normal effort. Breath sounds normal. No. Apnea. Clear to auscultation,no stridor,  no wheezing and no rhonchi noted. No respiratory distress and no tenderness noted. Abdomen: Soft, nontender, nondistended, normal bowel sounds, no masses no hepatosplenomegaly noted. No fluid wave and no ascites. There is no guarding or rebound. Neuro: Alert and oriented to person, place and time. Normal motor skills, Displays no atrophy or tremors and exhibits normal muscle tone.  No focal neurological deficits noted cranial nerves II through XII grossly intact. No sensory deficit noted. Strength at baseline in bilateral upper and lower extremities. Gait normal. Musculoskeletal: No warmth swelling or erythema around joints, no spinal tenderness noted. Psychiatric: Patient alert and oriented x3, good insight and cognition. Mood, memory, affect and judgement normal Skin: Skin is warm and dry. No bruising, no ecchymosis and no rash noted. Pt is not diaphoretic. No erythema. No pallor     No results for input(s): NA, K, CL, CO2, GLUCOSE, BUN, CREATININE, CALCIUM, MG, PHOS in the last 72 hours. No results for input(s): AST, ALT, ALKPHOS, BILITOT, PROT, ALBUMIN in the last 72 hours. No results for input(s): LIPASE, AMYLASE in the last 72  hours. No results for input(s): WBC, NEUTROABS, HGB, HCT, MCV, PLT in the last 72 hours.   Total time spent including face to face and decision making was greater than 30 minutes  Signed: [name],[name] 01/08/2016, 9:33 AM

## 2016-02-11 ENCOUNTER — Encounter (HOSPITAL_COMMUNITY): Payer: Self-pay | Admitting: Emergency Medicine

## 2016-02-11 ENCOUNTER — Emergency Department (HOSPITAL_COMMUNITY): Payer: Medicare Other

## 2016-02-11 ENCOUNTER — Inpatient Hospital Stay (HOSPITAL_COMMUNITY)
Admission: EM | Admit: 2016-02-11 | Discharge: 2016-02-17 | DRG: 812 | Disposition: A | Discharge status: Home / Self Care | Payer: Medicare Other | Attending: Internal Medicine | Admitting: Internal Medicine

## 2016-02-11 DIAGNOSIS — R112 Nausea with vomiting, unspecified: Secondary | ICD-10-CM | POA: Diagnosis present

## 2016-02-11 DIAGNOSIS — G8929 Other chronic pain: Secondary | ICD-10-CM | POA: Diagnosis present

## 2016-02-11 DIAGNOSIS — M25552 Pain in left hip: Secondary | ICD-10-CM | POA: Diagnosis present

## 2016-02-11 DIAGNOSIS — M90552 Osteonecrosis in diseases classified elsewhere, left thigh: Secondary | ICD-10-CM | POA: Diagnosis present

## 2016-02-11 DIAGNOSIS — D57219 Sickle-cell/Hb-C disease with crisis, unspecified: Principal | ICD-10-CM | POA: Diagnosis present

## 2016-02-11 DIAGNOSIS — K5900 Constipation, unspecified: Secondary | ICD-10-CM | POA: Diagnosis present

## 2016-02-11 DIAGNOSIS — D5700 Hb-SS disease with crisis, unspecified: Secondary | ICD-10-CM

## 2016-02-11 DIAGNOSIS — Z96641 Presence of right artificial hip joint: Secondary | ICD-10-CM | POA: Diagnosis present

## 2016-02-11 DIAGNOSIS — D638 Anemia in other chronic diseases classified elsewhere: Secondary | ICD-10-CM | POA: Diagnosis present

## 2016-02-11 DIAGNOSIS — F17210 Nicotine dependence, cigarettes, uncomplicated: Secondary | ICD-10-CM | POA: Diagnosis present

## 2016-02-11 LAB — CBC WITH DIFFERENTIAL/PLATELET
Basophils Absolute: 0.1 10*3/uL (ref 0.0–0.1)
Basophils Relative: 1 %
Eosinophils Absolute: 0.7 10*3/uL (ref 0.0–0.7)
Eosinophils Relative: 6 %
HEMATOCRIT: 28.9 % — AB (ref 39.0–52.0)
HEMOGLOBIN: 10.7 g/dL — AB (ref 13.0–17.0)
LYMPHS ABS: 2.6 10*3/uL (ref 0.7–4.0)
LYMPHS PCT: 24 %
MCH: 32.2 pg (ref 26.0–34.0)
MCHC: 37 g/dL — ABNORMAL HIGH (ref 30.0–36.0)
MCV: 87 fL (ref 78.0–100.0)
Monocytes Absolute: 0.9 10*3/uL (ref 0.1–1.0)
Monocytes Relative: 8 %
NEUTROS PCT: 61 %
Neutro Abs: 6.6 10*3/uL (ref 1.7–7.7)
Platelets: 301 10*3/uL (ref 150–400)
RBC: 3.32 MIL/uL — AB (ref 4.22–5.81)
RDW: 14.2 % (ref 11.5–15.5)
WBC: 10.8 10*3/uL — AB (ref 4.0–10.5)

## 2016-02-11 LAB — COMPREHENSIVE METABOLIC PANEL
ALT: 14 U/L — ABNORMAL LOW (ref 17–63)
AST: 24 U/L (ref 15–41)
Albumin: 4.7 g/dL (ref 3.5–5.0)
Alkaline Phosphatase: 76 U/L (ref 38–126)
Anion gap: 6 (ref 5–15)
BUN: 14 mg/dL (ref 6–20)
CHLORIDE: 107 mmol/L (ref 101–111)
CO2: 24 mmol/L (ref 22–32)
Calcium: 9.1 mg/dL (ref 8.9–10.3)
Creatinine, Ser: 0.86 mg/dL (ref 0.61–1.24)
Glucose, Bld: 92 mg/dL (ref 65–99)
POTASSIUM: 3.5 mmol/L (ref 3.5–5.1)
SODIUM: 137 mmol/L (ref 135–145)
Total Bilirubin: 2 mg/dL — ABNORMAL HIGH (ref 0.3–1.2)
Total Protein: 8.3 g/dL — ABNORMAL HIGH (ref 6.5–8.1)

## 2016-02-11 LAB — RETICULOCYTES
RBC.: 3.34 MIL/uL — AB (ref 4.22–5.81)
RETIC COUNT ABSOLUTE: 106.9 10*3/uL (ref 19.0–186.0)
RETIC CT PCT: 3.2 % — AB (ref 0.4–3.1)

## 2016-02-11 MED ORDER — HYDROMORPHONE 1 MG/ML IV SOLN
INTRAVENOUS | Status: DC
  Administered 2016-02-11: 18:00:00 via INTRAVENOUS
  Administered 2016-02-11: 7 mg via INTRAVENOUS
  Administered 2016-02-12: 8.4 mg via INTRAVENOUS
  Administered 2016-02-12: 7 mg via INTRAVENOUS
  Administered 2016-02-12: 5.1 mg via INTRAVENOUS
  Administered 2016-02-12: 9.1 mg via INTRAVENOUS
  Administered 2016-02-12: 0 mg via INTRAVENOUS
  Administered 2016-02-12: 22:00:00 via INTRAVENOUS
  Administered 2016-02-12: 25 mg via INTRAVENOUS
  Administered 2016-02-12: 1.4 mg via INTRAVENOUS
  Administered 2016-02-13: 2.1 mg via INTRAVENOUS
  Administered 2016-02-13: 19:00:00 via INTRAVENOUS
  Administered 2016-02-13: 5.15 mg via INTRAVENOUS
  Administered 2016-02-13: 3.5 mg via INTRAVENOUS
  Administered 2016-02-13: 9 mg via INTRAVENOUS
  Administered 2016-02-13: 4.2 mg via INTRAVENOUS
  Administered 2016-02-14: 15.4 mg via INTRAVENOUS
  Administered 2016-02-14: 9.8 mg via INTRAVENOUS
  Administered 2016-02-14: 3.5 mg via INTRAVENOUS
  Administered 2016-02-14: 10:00:00 via INTRAVENOUS
  Administered 2016-02-14: 2.1 mg via INTRAVENOUS
  Administered 2016-02-14: 14 mg via INTRAVENOUS
  Administered 2016-02-14: 7.7 mg via INTRAVENOUS
  Administered 2016-02-15: 6.3 mg via INTRAVENOUS
  Administered 2016-02-15: 4.2 mg via INTRAVENOUS
  Administered 2016-02-15 (×2): 7.7 mg via INTRAVENOUS
  Administered 2016-02-15: 8.22 mg via INTRAVENOUS
  Administered 2016-02-16: 19:00:00 via INTRAVENOUS
  Administered 2016-02-16: 6.3 mg via INTRAVENOUS
  Administered 2016-02-16: 3.5 mg via INTRAVENOUS
  Administered 2016-02-16: 7.7 mg via INTRAVENOUS
  Administered 2016-02-16: 1 mg via INTRAVENOUS
  Administered 2016-02-16: 1.85 mg via INTRAVENOUS
  Administered 2016-02-16: 7 mg via INTRAVENOUS
  Administered 2016-02-17: 4.2 mg via INTRAVENOUS
  Administered 2016-02-17: 9.1 mg via INTRAVENOUS
  Administered 2016-02-17: 2.1 mg via INTRAVENOUS
  Administered 2016-02-17: 9.8 mg via INTRAVENOUS
  Filled 2016-02-11 (×8): qty 25

## 2016-02-11 MED ORDER — CYCLOBENZAPRINE HCL 10 MG PO TABS
10.0000 mg | ORAL_TABLET | Freq: Every day | ORAL | Status: DC
  Administered 2016-02-11 – 2016-02-16 (×6): 10 mg via ORAL
  Filled 2016-02-11 (×6): qty 1

## 2016-02-11 MED ORDER — SODIUM CHLORIDE 0.9% FLUSH: 9.0000 mL | INTRAVENOUS | Status: DC | PRN

## 2016-02-11 MED ORDER — ONDANSETRON HCL 4 MG/2ML IJ SOLN: 4.0000 mg | INTRAMUSCULAR | Status: DC | PRN

## 2016-02-11 MED ORDER — MORPHINE SULFATE ER 30 MG PO TBCR
60.0000 mg | EXTENDED_RELEASE_TABLET | Freq: Two times a day (BID) | ORAL | Status: DC
  Administered 2016-02-11 – 2016-02-17 (×12): 60 mg via ORAL
  Filled 2016-02-11 (×12): qty 2

## 2016-02-11 MED ORDER — HYDROMORPHONE HCL 2 MG/ML IJ SOLN
2.0000 mg | Freq: Once | INTRAMUSCULAR | Status: AC
  Administered 2016-02-11: 2 mg via INTRAVENOUS
  Filled 2016-02-11: qty 1

## 2016-02-11 MED ORDER — KETOROLAC TROMETHAMINE 30 MG/ML IJ SOLN
30.0000 mg | Freq: Four times a day (QID) | INTRAMUSCULAR | Status: AC
  Administered 2016-02-11 – 2016-02-16 (×20): 30 mg via INTRAVENOUS
  Filled 2016-02-11 (×21): qty 1

## 2016-02-11 MED ORDER — DIPHENHYDRAMINE HCL 25 MG PO CAPS: 25.0000 mg | ORAL_CAPSULE | ORAL | Status: DC | PRN

## 2016-02-11 MED ORDER — HYDROMORPHONE HCL 2 MG/ML IJ SOLN
2.5000 mg | Freq: Once | INTRAMUSCULAR | Status: AC
  Administered 2016-02-11: 2.5 mg via INTRAVENOUS
  Filled 2016-02-11: qty 2

## 2016-02-11 MED ORDER — DEXTROSE-NACL 5-0.45 % IV SOLN
INTRAVENOUS | Status: DC
  Administered 2016-02-11 – 2016-02-15 (×7): via INTRAVENOUS

## 2016-02-11 MED ORDER — PROMETHAZINE HCL 25 MG RE SUPP
25.0000 mg | Freq: Four times a day (QID) | RECTAL | Status: DC | PRN
  Filled 2016-02-11: qty 1

## 2016-02-11 MED ORDER — HYDROMORPHONE HCL 2 MG/ML IJ SOLN
3.0000 mg | Freq: Once | INTRAMUSCULAR | Status: AC
  Administered 2016-02-11: 3 mg via INTRAVENOUS
  Filled 2016-02-11: qty 2

## 2016-02-11 MED ORDER — SENNOSIDES-DOCUSATE SODIUM 8.6-50 MG PO TABS
1.0000 | ORAL_TABLET | Freq: Two times a day (BID) | ORAL | Status: DC
  Administered 2016-02-11 – 2016-02-17 (×12): 1 via ORAL
  Filled 2016-02-11 (×12): qty 1

## 2016-02-11 MED ORDER — SODIUM CHLORIDE 0.9 % IV SOLN
25.0000 mg | INTRAVENOUS | Status: DC | PRN
  Filled 2016-02-11: qty 0.5

## 2016-02-11 MED ORDER — HYDROMORPHONE HCL 2 MG/ML IJ SOLN: 2.0000 mg | Freq: Once | INTRAMUSCULAR | Status: DC

## 2016-02-11 MED ORDER — FOLIC ACID 1 MG PO TABS
1.0000 mg | ORAL_TABLET | Freq: Every evening | ORAL | Status: DC
  Administered 2016-02-11 – 2016-02-16 (×6): 1 mg via ORAL
  Filled 2016-02-11 (×6): qty 1

## 2016-02-11 MED ORDER — DIPHENHYDRAMINE HCL 50 MG/ML IJ SOLN
25.0000 mg | Freq: Once | INTRAMUSCULAR | Status: AC
  Administered 2016-02-11: 25 mg via INTRAVENOUS
  Filled 2016-02-11: qty 1

## 2016-02-11 MED ORDER — ENOXAPARIN SODIUM 40 MG/0.4ML ~~LOC~~ SOLN
40.0000 mg | SUBCUTANEOUS | Status: DC
  Administered 2016-02-11 – 2016-02-14 (×4): 40 mg via SUBCUTANEOUS
  Filled 2016-02-11 (×6): qty 0.4

## 2016-02-11 MED ORDER — PROMETHAZINE HCL 25 MG PO TABS
25.0000 mg | ORAL_TABLET | Freq: Four times a day (QID) | ORAL | Status: DC | PRN
  Administered 2016-02-11 – 2016-02-13 (×5): 25 mg via ORAL
  Filled 2016-02-11 (×5): qty 1

## 2016-02-11 MED ORDER — NALOXONE HCL 0.4 MG/ML IJ SOLN: 0.4000 mg | INTRAMUSCULAR | Status: DC | PRN

## 2016-02-11 MED ORDER — POLYETHYLENE GLYCOL 3350 17 G PO PACK
17.0000 g | PACK | Freq: Every day | ORAL | Status: DC | PRN
  Administered 2016-02-15: 17 g via ORAL
  Filled 2016-02-11: qty 1

## 2016-02-11 MED ORDER — KETOROLAC TROMETHAMINE 30 MG/ML IJ SOLN
30.0000 mg | INTRAMUSCULAR | Status: AC
  Administered 2016-02-11: 30 mg via INTRAVENOUS
  Filled 2016-02-11: qty 1

## 2016-02-11 NOTE — ED Notes (Signed)
Bed: WA09 Expected date:  Expected time:  Means of arrival:  Comments: Facility

## 2016-02-11 NOTE — H&P (Signed)
[name] Admission Note Date: 02/11/2016  Patient name: [name] Medical record number: [id number] Date of birth: Dec 25, 1975 Age: 40 y.o. Gender: male PCP: Dorrene German, MD  Attending physician: Altha Harm, MD  Chief Complaint: Pain in left hip, legs and back x 3 days  History of Present Illness:[name] is an opiate tolerant patient with Hb [place] who reports that he started having pain in his left hip and back x 3 days. Patient is known to have AVN of the left hip and feels that it is getting progressively worse. He reports that his current regimen did not control his pain and  Thus he presented to the ED for care. Currently his pain in localized to his left hip and back and is at an intensity of 9/10.  The pain is described as throbbing and occasionally sharp in nature like that of his usual sickle cell crisis. He denies any associated symptoms of cough, SOB, vomiting, emesis or diarrhea. He has no focal neurological deficits and denies headache, LOC, fever or chills.  In the ED an x-ray of the left hip showed osseous changes consistent with previous AVN changes. He received 3 doses of IV analgesics and his pain is still at 9/10. I am asked to admit him for sickle cell crisis.   Scheduled Meds:  Continuous Infusions: . dextrose 5 % and 0.45% NaCl 125 mL/hr at 02/11/16 1126   PRN Meds:.ondansetron Allergies: Review of patient's allergies indicates no known allergies. Past Medical History:  Diagnosis Date  . Avascular necrosis of hip (HCC)    bilateral  . Avascular necrosis of hip, left (HCC) 08/27/2011  . Blood transfusion   . Infection of bone, shoulder region (HCC)    left shoulder  . Pneumonia   . Sickle cell crisis [name])    Past Surgical History:  Procedure Laterality Date  . BONE GRAFT HIP ILIAC CREST    . JOINT REPLACEMENT  2006   right total hip arthroplasty  . [place] right hip fracture  1995   Family History  Problem Relation Age of Onset  . Adopted: Yes    Social History   Social History  . Marital status: Single    Spouse name: N/A  . Number of children: N/A  . Years of education: N/A   Occupational History  . Not on file.   Social History Main Topics  . Smoking status: Current Some Day Smoker    Packs/day: 0.50    Types: Cigarettes    Last attempt to quit: 01/27/2012  . Smokeless tobacco: Never Used  . Alcohol use No  . Drug use: No  . Sexual activity: No   Other Topics Concern  . Not on file   Social History Narrative   On disability.  No assist devices.  No home health services.  Lives with his father.     Review of Systems: Pertinent items noted in HPI and remainder of comprehensive ROS otherwise negative.   Physical Exam: No intake or output data in the 24 hours ending 02/11/16 1617 General: Alert, awake, oriented x3, in no acute distress.  HEENT: [place]/AT PEERL, EOMI, anicteric Neck: Trachea midline,  no masses, no thyromegal,y no JVD, no carotid bruit OROPHARYNX:  Moist, No exudate/ erythema/lesions.  Heart: Regular rate and rhythm, without murmurs, rubs, gallops, PMI non-displaced, no heaves or thrills on palpation.  Lungs: Clear to auscultation, no wheezing or rhonchi noted. No increased vocal fremitus resonant to percussion  Abdomen: Soft, nontender, nondistended, positive bowel sounds,  no masses no hepatosplenomegaly noted..  Neuro: No focal neurological deficits noted cranial nerves II through XII grossly intact.  Strength at functional baseline. in bilateral upper and lower extremities. Musculoskeletal: No warm swelling or erythema around joints, no spinal tenderness noted. Psychiatric: Patient alert and oriented x3, good insight and cognition, good recent to remote recall.   Lab results:  Recent Labs  02/11/16 1033  NA 137  K 3.5  CL 107  CO2 24  GLUCOSE 92  BUN 14  CREATININE 0.86  CALCIUM 9.1    Recent Labs  02/11/16 1033  AST 24  ALT 14*  ALKPHOS 76  BILITOT 2.0*  PROT 8.3*  ALBUMIN  4.7   No results for input(s): LIPASE, AMYLASE in the last 72 hours.  Recent Labs  02/11/16 1033  WBC 10.8*  NEUTROABS 6.6  HGB 10.7*  HCT 28.9*  MCV 87.0  PLT 301   No results for input(s): CKTOTAL, CKMB, CKMBINDEX, TROPONINI in the last 72 hours. Invalid input(s): POCBNP No results for input(s): DDIMER in the last 72 hours. No results for input(s): HGBA1C in the last 72 hours. No results for input(s): CHOL, HDL, LDLCALC, TRIG, CHOLHDL, LDLDIRECT in the last 72 hours. No results for input(s): TSH, T4TOTAL, T3FREE, THYROIDAB in the last 72 hours.  Invalid input(s): FREET3  Recent Labs  02/11/16 1033  RETICCTPCT 3.2*   Imaging results:  Dg Chest 1 View  Result Date: 02/11/2016 CLINICAL DATA:  Pain on the right side.  Sickle cell history. EXAM: CHEST 1 VIEW COMPARISON:  12/05/2015 FINDINGS: Stable scarring peripherally in both lungs, right greater than left. Mildly tortuous thoracic aorta. Heart size within normal limits. Suspicion for avascular necrosis in the humeral heads. No new airspace opacity is identified in the lungs. IMPRESSION: 1. Stable scarring in both lungs without acute findings. 2. 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 Electronically Signed   By: Gaylyn RongWalter  Liebkemann M.D.   On: 02/11/2016 11:49   Dg Pelvis 1-2 Views  Result Date: 02/11/2016 CLINICAL DATA:  40 year old male with sickle cell crisis with right-sided pain left hip pain for 3 days. Initial encounter. EXAM: PELVIS - 1-2 VIEW COMPARISON:  08/26/2011 FINDINGS: Post right hip replacement. 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 Sacroiliac joint degenerative changes/ bony infarcts similar to prior exam. IMPRESSION: Osseous changes similar to prior exam as noted above. Electronically Signed   By: [name]  Olson M.D.   On: 02/11/2016 11:50    Assessment and Plan:  Hb [place] with crisis: Will start on Dilaudid PCA, Toradol and IVF. Re-assess pain tomorrow.    AVN left hip: Will need Orthopedic referral as an out patient. Pt's right hip was replaced by Dr. Charlann Boxerlin.   Chronic pain: Continue MS Contin. Although the minimum dosing interval is every 12 hours, his PMD has prescribed it only every 24 hours.   Mild Leukocytosis: Likely secondary to crisis. Will continue to monitor.  Anemia of Chronic Disease: Hb stable.   [name],[name] 02/11/2016, 4:17 PM

## 2016-02-11 NOTE — ED Triage Notes (Signed)
Pt c/o SCC pain to rt side and lt hip.  No NVD.  No new sx.  States typical crisis pain. No SOB.

## 2016-02-12 DIAGNOSIS — D57219 Sickle-cell/Hb-C disease with crisis, unspecified: Principal | ICD-10-CM

## 2016-02-12 DIAGNOSIS — K5900 Constipation, unspecified: Secondary | ICD-10-CM

## 2016-02-12 MED ORDER — LACTULOSE 10 GM/15ML PO SOLN
30.0000 g | Freq: Every day | ORAL | Status: DC | PRN
  Administered 2016-02-12 – 2016-02-16 (×3): 30 g via ORAL
  Filled 2016-02-12 (×3): qty 45

## 2016-02-12 MED ORDER — HYDROMORPHONE HCL 2 MG/ML IJ SOLN
2.0000 mg | INTRAMUSCULAR | Status: DC
  Administered 2016-02-12 – 2016-02-13 (×10): 2 mg via INTRAVENOUS
  Filled 2016-02-12 (×10): qty 1

## 2016-02-12 MED ORDER — ONDANSETRON HCL 4 MG/2ML IJ SOLN
4.0000 mg | Freq: Four times a day (QID) | INTRAMUSCULAR | Status: DC | PRN
  Administered 2016-02-12 – 2016-02-13 (×5): 4 mg via INTRAVENOUS
  Filled 2016-02-12 (×5): qty 2

## 2016-02-12 NOTE — Care Management Note (Signed)
Case Management Note  Patient Details  Name: [name] MRN: [id number] Date of Birth: 1976/01/08  Subjective/Objective:     40 yo admitted with SCC.               Action/Plan: From home with father. Chart reviewed and CM following for DC needs.  Expected Discharge Date:   (unknown)               Expected Discharge Plan:  Home/Self Care  In-House Referral:     Discharge planning Services  CM Consult  Post Acute Care Choice:    Choice offered to:     DME Arranged:    DME Agency:     HH Arranged:    HH Agency:     Status of Service:  In process, will continue to follow  If discussed at Long Length of Stay Meetings, dates discussed:    Additional CommentsBartholome Bill:  Tal Neer H, RN 02/12/2016, 2:03 PM  [phone]

## 2016-02-12 NOTE — Progress Notes (Signed)
SICKLE CELL SERVICE PROGRESS NOTE  [name] ZOX:[id number] DOB: [birth year] DOA: 02/11/2016 PCP: Dorrene GermanEdwin A Avbuere, MD  Assessment/Plan: Active Problems:   Sickle-cell/Hb-C disease with crisis (HCC)  1. Hb [place] with crisis: Pt states that his pain is 8/10 and localized to left hip and right ribs. Pain is at an intensity of 8/10. Will continue Toradol,  PCA, and add intermittent clincian assisted doses of Dilaudid. Decrease IVF rate.  2. Leukocytosis: Mild likely related to crisis.  3. Nausea and Vomiting: Continue Phenergan and Zofran PRN.  4. Constipation: Lactulose ordered. 5. Anemia of chronic disease: Hb at baseline.  6. Chronic pain: Continue MS Contin.    Code Status: Full Code Family Communication: N/A Disposition Plan: Not yet ready for discharge  Helyn Schwan A.  Pager [phone]. If 7PM-7AM, please contact night-coverage.  02/12/2016, 11:29 AM  LOS: 1 day   Interim History: Pt had an episode of emesis last night. He rates pain at intensity of 8/10 and localized to left hip and rib.   Consultants:  None  Procedures:  None  Antibiotics:  None   Objective: Vitals:   02/12/16 0400 02/12/16 0511 02/12/16 0800 02/12/16 1005  BP:  128/81  (!) 127/94  Pulse:  72  68  Resp: 14  (!) 9 10  Temp:  98 F (36.7 C)  98.1 F (36.7 C)  TempSrc:  Oral  Oral  SpO2: 91% 92% 98% 98%  Weight:  81.3 kg (179 lb 3.2 oz)    Height:       Weight change:   Intake/Output Summary (Last 24 hours) at 02/12/16 1129 Last data filed at 02/12/16 0610  Gross per 24 hour  Intake          2701.67 ml  Output              600 ml  Net          2101.67 ml    General: Alert, awake, oriented x3, in no acute distress.  HEENT: [place]/AT PEERL, EOMI, anicteric Neck: Trachea midline,  no masses, no thyromegal,y no JVD, no carotid bruit OROPHARYNX:  Moist, No exudate/ erythema/lesions.  Heart: Regular rate and rhythm, without murmurs, rubs, gallops, PMI non-displaced, no heaves or  thrills on palpation.  Lungs: Clear to auscultation, no wheezing or rhonchi noted. No increased vocal fremitus resonant to percussion  Abdomen: Soft, nontender, nondistended, positive bowel sounds, no masses no hepatosplenomegaly noted.  Neuro: No focal neurological deficits noted cranial nerves II through XII grossly intact.  Strength at baseline in bilateral upper and lower extremities. Musculoskeletal: No warmth swelling or erythema around joints, no spinal tenderness noted. Psychiatric: Patient alert and oriented x3, good insight and cognition, good recent to remote recall. Lymph node survey: No cervical axillary or inguinal lymphadenopathy noted.   Data Reviewed: Basic Metabolic Panel:  Recent Labs Lab 02/11/16 1033  NA 137  K 3.5  CL 107  CO2 24  GLUCOSE 92  BUN 14  CREATININE 0.86  CALCIUM 9.1   Liver Function Tests:  Recent Labs Lab 02/11/16 1033  AST 24  ALT 14*  ALKPHOS 76  BILITOT 2.0*  PROT 8.3*  ALBUMIN 4.7   No results for input(s): LIPASE, AMYLASE in the last 168 hours. No results for input(s): AMMONIA in the last 168 hours. CBC:  Recent Labs Lab 02/11/16 1033  WBC 10.8*  NEUTROABS 6.6  HGB 10.7*  HCT 28.9*  MCV 87.0  PLT 301   Cardiac Enzymes: No results for input(s): CKTOTAL,  CKMB, CKMBINDEX, TROPONINI in the last 168 hours. BNP (last 3 results) No results for input(s): BNP in the last 8760 hours.  ProBNP (last 3 results) No results for input(s): PROBNP in the last 8760 hours.  CBG: No results for input(s): GLUCAP in the last 168 hours.  No results found for this or any previous visit (from the past 240 hour(s)).   Studies: Dg Chest 1 View  Result Date: 02/11/2016 CLINICAL DATA:  Pain on the right side.  Sickle cell history. EXAM: CHEST 1 VIEW COMPARISON:  12/05/2015 FINDINGS: Stable scarring peripherally in both lungs, right greater than left. Mildly tortuous thoracic aorta. Heart size within normal limits. Suspicion for avascular  necrosis in the humeral heads. No new airspace opacity is identified in the lungs. IMPRESSION: 1. Stable scarring in both lungs without acute findings. 2. 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 Electronically Signed   By: Gaylyn Rong M.D.   On: 02/11/2016 11:49   Dg Pelvis 1-2 Views  Result Date: 02/11/2016 CLINICAL DATA:  40 year old male with sickle cell crisis with right-sided pain left hip pain for 3 days. Initial encounter. EXAM: PELVIS - 1-2 VIEW COMPARISON:  08/26/2011 FINDINGS: Post right hip replacement. 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 Sacroiliac joint degenerative changes/ bony infarcts similar to prior exam. IMPRESSION: Osseous changes similar to prior exam as noted above. Electronically Signed   By: Lacy Duverney M.D.   On: 02/11/2016 11:50    Scheduled Meds: . cyclobenzaprine  10 mg Oral QHS  . enoxaparin (LOVENOX) injection  40 mg Subcutaneous Q24H  . folic acid  1 mg Oral QPM  . HYDROmorphone   Intravenous Q4H  .  HYDROmorphone (DILAUDID) injection  2 mg Intravenous Q3H  . ketorolac  30 mg Intravenous Q6H  . morphine  60 mg Oral BID  . senna-docusate  1 tablet Oral BID   Continuous Infusions: . dextrose 5 % and 0.45% NaCl 125 mL/hr at 02/12/16 0610    Active Problems:   Sickle-cell/Hb-C disease with crisis (HCC)  In excess of 25 minutes spent during visit. Greater than 50% involved face to face contact with the patient for assessment, counseling and coordination of care.       >50% involved face to face contact with the patient for assessment, counseling and coordination of care.

## 2016-02-13 MED ORDER — HYDROMORPHONE HCL 2 MG/ML IJ SOLN
2.0000 mg | INTRAMUSCULAR | Status: DC
  Administered 2016-02-13 – 2016-02-15 (×20): 2 mg via INTRAVENOUS
  Filled 2016-02-13 (×19): qty 1

## 2016-02-13 NOTE — Progress Notes (Signed)
SICKLE CELL SERVICE PROGRESS NOTE  [name] MVH:[id number] DOB: [birth year] DOA: 02/11/2016 PCP: Dorrene German, MD  Assessment/Plan: Active Problems:   Sickle-cell/Hb-C disease with crisis (HCC)  1. Hb [place] with crisis:  Will continue Scheduled oral dilaudid,  Toradol and PCA. Discontinue intermittent clincian assisted doses of Dilaudid.  2. Leukocytosis: Mild likely related to crisis.  3. Nausea and Vomiting: Resolved.  4. Constipation: Pt had BM yesterday. 5. Anemia of chronic disease: Hb at baseline.  6. Chronic pain: Continue MS Contin.    Code Status: Full Code Family Communication: N/A Disposition Plan: Anticipate discharge home tomorrow.  [name],[name]  Pager [phone]. If 7PM-7AM, please contact night-coverage.  02/13/2016, 5:34 PM  LOS: 2 days   Interim History: Pt reports pain markedly improved and at 5/10 localized to right ribs anb left hip. He has been up ambulating and has used a moderate amount of Dilaudid on the PCA. However his use has slowed down in the last 4 hours.   Consultants:  None  Procedures:  None  Antibiotics:  None   Objective: Vitals:   02/13/16 0735 02/13/16 1038 02/13/16 1219 02/13/16 1400  BP:  134/86  123/85  Pulse:  92  78  Resp:  16 16 12   Temp:  98.4 F (36.9 C)  98.4 F (36.9 C)  TempSrc:  Oral  Oral  SpO2: (!) 89% 98% 98% 97%  Weight:      Height:       Weight change: 2.812 kg (6 lb 3.2 oz)  Intake/Output Summary (Last 24 hours) at 02/13/16 1734 Last data filed at 02/13/16 0616  Gross per 24 hour  Intake             2875 ml  Output             1000 ml  Net             1875 ml    General: Alert, awake, oriented x3, in no acute distress.  HEENT: [place]/AT PEERL, EOMI, anicteric Heart: Regular rate and rhythm, without murmurs, rubs, gallops, PMI non-displaced, no heaves or thrills on palpation.  Lungs: Clear to auscultation, no wheezing or rhonchi noted. No increased vocal fremitus resonant to percussion   Abdomen: Soft, nontender, nondistended, positive bowel sounds, no masses no hepatosplenomegaly noted.  Neuro: No focal neurological deficits noted cranial nerves II through XII grossly intact.  Strength at baseline in bilateral upper and lower extremities. Musculoskeletal: No warmth swelling or erythema around joints, no spinal tenderness noted. Psychiatric: Patient alert and oriented x3, good insight and cognition, good recent to remote recall.    Data Reviewed: Basic Metabolic Panel:  Recent Labs Lab 02/11/16 1033  NA 137  K 3.5  CL 107  CO2 24  GLUCOSE 92  BUN 14  CREATININE 0.86  CALCIUM 9.1   Liver Function Tests:  Recent Labs Lab 02/11/16 1033  AST 24  ALT 14*  ALKPHOS 76  BILITOT 2.0*  PROT 8.3*  ALBUMIN 4.7   No results for input(s): LIPASE, AMYLASE in the last 168 hours. No results for input(s): AMMONIA in the last 168 hours. CBC:  Recent Labs Lab 02/11/16 1033  WBC 10.8*  NEUTROABS 6.6  HGB 10.7*  HCT 28.9*  MCV 87.0  PLT 301   Cardiac Enzymes: No results for input(s): CKTOTAL, CKMB, CKMBINDEX, TROPONINI in the last 168 hours. BNP (last 3 results) No results for input(s): BNP in the last 8760 hours.  ProBNP (last 3 results) No results  for input(s): PROBNP in the last 8760 hours.  CBG: No results for input(s): GLUCAP in the last 168 hours.  No results found for this or any previous visit (from the past 240 hour(s)).   Studies: Dg Chest 1 View  Result Date: 02/11/2016 CLINICAL DATA:  Pain on the right side.  Sickle cell history. EXAM: CHEST 1 VIEW COMPARISON:  12/05/2015 FINDINGS: Stable scarring peripherally in both lungs, right greater than left. Mildly tortuous thoracic aorta. Heart size within normal limits. Suspicion for avascular necrosis in the humeral heads. No new airspace opacity is identified in the lungs. IMPRESSION: 1. Stable scarring in both lungs without acute findings. 2. Suspected mild avascular necrosis in both humeral  heads. Electronically Signed   By: Gaylyn RongWalter  Liebkemann M.D.   On: 02/11/2016 11:49   Dg Pelvis 1-2 Views  Result Date: 02/11/2016 CLINICAL DATA:  40 year old male with sickle cell crisis with right-sided pain left hip pain for 3 days. Initial encounter. EXAM: PELVIS - 1-2 VIEW COMPARISON:  08/26/2011 FINDINGS: Post right hip replacement. 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 Sacroiliac joint degenerative changes/ bony infarcts similar to prior exam. IMPRESSION: Osseous changes similar to prior exam as noted above. Electronically Signed   By: [name]  Olson M.D.   On: 02/11/2016 11:50    Scheduled Meds: . cyclobenzaprine  10 mg Oral QHS  . enoxaparin (LOVENOX) injection  40 mg Subcutaneous Q24H  . folic acid  1 mg Oral QPM  . HYDROmorphone   Intravenous Q4H  .  HYDROmorphone (DILAUDID) injection  2 mg Intravenous Q3H  . ketorolac  30 mg Intravenous Q6H  . morphine  60 mg Oral BID  . senna-docusate  1 tablet Oral BID   Continuous Infusions: . dextrose 5 % and 0.45% NaCl 125 mL/hr at 02/13/16 1430    Active Problems:   Sickle-cell/Hb-C disease with crisis (HCC)  In excess of 25 minutes spent during visit. Greater than 50% involved face to face contact with the patient for assessment, counseling and coordination of care.

## 2016-02-14 NOTE — Progress Notes (Signed)
SICKLE CELL SERVICE PROGRESS NOTE  Malik PortHoward E [name] WUJ:[id number] DOB: [birth year] DOA: 02/11/2016 PCP: Dorrene GermanEdwin A Avbuere, MD  Assessment/Plan: Active Problems:   Sickle-cell/Hb-C disease with crisis (HCC)  1. Hb [place] with crisis: Pt states that his pain is 7/10 and localized to left hip and right ribs. This is improved over yesterday. He has used 59 mg with 78 demands and 56 deliveries of the Dilaudid in the last 24 hours  Will continue Toradol,  PCA, and continue intermittent clincian assisted doses of Dilaudid. Decrease IVF rate.  2. Leukocytosis: Mild likely related to crisis. Continue to monitor 3. Nausea and Vomiting: Continue Phenergan and Zofran PRN.  4. Constipation: Lactulose ordered. Still no bowel movement today. 5. Anemia of chronic disease: Hb at baseline.  6. Chronic pain: Continue MS Contin.    Code Status: Full Code Family Communication: N/A Disposition Plan: Not yet ready for discharge  [facility] Center[facility]AR[facility],[name]  Pager [phone]. If 7PM-7AM, please contact night-coverage.  02/14/2016, 8:22 AM  LOS: 3 days   Interim History: Pt continues to have pain but better.Marland Kitchen. He rates pain at intensity of 7/10 and localized to left hip and rib.   Consultants:  None  Procedures:  None  Antibiotics:  None   Objective: Vitals:   02/14/16 0000 02/14/16 0303 02/14/16 0400 02/14/16 0639  BP:  (!) 134/92  132/74  Pulse:  79  84  Resp: 13 13 15 10   Temp:  98.3 F (36.8 C)  98.4 F (36.9 C)  TempSrc:  Oral  Oral  SpO2: 95% 96% 96% 93%  Weight:    82.1 kg (181 lb)  Height:       Weight change: -0.091 kg (-3.2 oz)  Intake/Output Summary (Last 24 hours) at 02/14/16 [id number] Last data filed at 02/14/16 0304  Gross per 24 hour  Intake          1706.67 ml  Output             2500 ml  Net          -793.33 ml    General: Alert, awake, oriented x3, in no acute distress.  HEENT: /AT [place], EOMI, anicteric Neck: Trachea midline,  no masses, no thyromegal,y no JVD, no carotid  bruit OROPHARYNX:  Moist, No exudate/ erythema/lesions.  Heart: Regular rate and rhythm, without murmurs, rubs, gallops, PMI non-displaced, no heaves or thrills on palpation.  Lungs: Clear to auscultation, no wheezing or rhonchi noted. No increased vocal fremitus resonant to percussion  Abdomen: Soft, nontender, nondistended, positive bowel sounds, no masses no hepatosplenomegaly noted.  Neuro: No focal neurological deficits noted cranial nerves II through XII grossly intact.  Strength at baseline in bilateral upper and lower extremities. Musculoskeletal: No warmth swelling or erythema around joints, no spinal tenderness noted. Psychiatric: Patient alert and oriented x3, good insight and cognition, good recent to remote recall. Lymph node survey: No cervical axillary or inguinal lymphadenopathy noted.   Data Reviewed: Basic Metabolic Panel:  Recent Labs Lab 02/11/16 1033  NA 137  K 3.5  CL 107  CO2 24  GLUCOSE 92  BUN 14  CREATININE 0.86  CALCIUM 9.1   Liver Function Tests:  Recent Labs Lab 02/11/16 1033  AST 24  ALT 14*  ALKPHOS 76  BILITOT 2.0*  PROT 8.3*  ALBUMIN 4.7   No results for input(s): LIPASE, AMYLASE in the last 168 hours. No results for input(s): AMMONIA in the last 168 hours. CBC:  Recent Labs Lab 02/11/16 1033  WBC 10.8*  NEUTROABS  6.6  HGB 10.7*  HCT 28.9*  MCV 87.0  PLT 301   Cardiac Enzymes: No results for input(s): CKTOTAL, CKMB, CKMBINDEX, TROPONINI in the last 168 hours. BNP (last 3 results) No results for input(s): BNP in the last 8760 hours.  ProBNP (last 3 results) No results for input(s): PROBNP in the last 8760 hours.  CBG: No results for input(s): GLUCAP in the last 168 hours.  No results found for this or any previous visit (from the past 240 hour(s)).   Studies: Dg Chest 1 View  Result Date: 02/11/2016 CLINICAL DATA:  Pain on the right side.  Sickle cell history. EXAM: CHEST 1 VIEW COMPARISON:  12/05/2015 FINDINGS:  Stable scarring peripherally in both lungs, right greater than left. Mildly tortuous thoracic aorta. Heart size within normal limits. Suspicion for avascular necrosis in the humeral heads. No new airspace opacity is identified in the lungs. IMPRESSION: 1. Stable scarring in both lungs without acute findings. 2. 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 Electronically Signed   By: Gaylyn Rong M.D.   On: 02/11/2016 11:49   Dg Pelvis 1-2 Views  Result Date: 02/11/2016 CLINICAL DATA:  40 year old male with sickle cell crisis with right-sided pain left hip pain for 3 days. Initial encounter. EXAM: PELVIS - 1-2 VIEW COMPARISON:  08/26/2011 FINDINGS: Post right hip replacement. 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 Sacroiliac joint degenerative changes/ bony infarcts similar to prior exam. IMPRESSION: Osseous changes similar to prior exam as noted above. Electronically Signed   By: Lacy Duverney M.D.   On: 02/11/2016 11:50    Scheduled Meds: . cyclobenzaprine  10 mg Oral QHS  . enoxaparin (LOVENOX) injection  40 mg Subcutaneous Q24H  . folic acid  1 mg Oral QPM  . HYDROmorphone   Intravenous Q4H  .  HYDROmorphone (DILAUDID) injection  2 mg Intravenous Q2H  . ketorolac  30 mg Intravenous Q6H  . morphine  60 mg Oral BID  . senna-docusate  1 tablet Oral BID   Continuous Infusions: . dextrose 5 % and 0.45% NaCl 125 mL/hr at 02/13/16 1430    Active Problems:   Sickle-cell/Hb-C disease with crisis (HCC)  In excess of 25 minutes spent during visit. Greater than 50% involved face to face contact with the patient for assessment, counseling and coordination of care.       >50% involved face to face contact with the patient for assessment, counseling and coordination of care.

## 2016-02-15 LAB — CBC WITH DIFFERENTIAL/PLATELET
BASOS ABS: 0.1 10*3/uL (ref 0.0–0.1)
Basophils Relative: 1 %
Eosinophils Absolute: 1.3 10*3/uL — ABNORMAL HIGH (ref 0.0–0.7)
Eosinophils Relative: 12 %
HEMATOCRIT: 24.5 % — AB (ref 39.0–52.0)
Hemoglobin: 8.9 g/dL — ABNORMAL LOW (ref 13.0–17.0)
LYMPHS ABS: 2.3 10*3/uL (ref 0.7–4.0)
LYMPHS PCT: 20 %
MCH: 32.2 pg (ref 26.0–34.0)
MCHC: 36.3 g/dL — ABNORMAL HIGH (ref 30.0–36.0)
MCV: 88.8 fL (ref 78.0–100.0)
MONO ABS: 0.9 10*3/uL (ref 0.1–1.0)
Monocytes Relative: 9 %
NEUTROS ABS: 6.5 10*3/uL (ref 1.7–7.7)
Neutrophils Relative %: 58 %
Platelets: 266 10*3/uL (ref 150–400)
RBC: 2.76 MIL/uL — AB (ref 4.22–5.81)
RDW: 15.7 % — ABNORMAL HIGH (ref 11.5–15.5)
WBC: 11.1 10*3/uL — AB (ref 4.0–10.5)

## 2016-02-15 LAB — RETICULOCYTES
RBC.: 2.76 MIL/uL — ABNORMAL LOW (ref 4.22–5.81)
RETIC COUNT ABSOLUTE: 160.1 10*3/uL (ref 19.0–186.0)
Retic Ct Pct: 5.8 % — ABNORMAL HIGH (ref 0.4–3.1)

## 2016-02-15 LAB — BASIC METABOLIC PANEL
ANION GAP: 6 (ref 5–15)
BUN: 5 mg/dL — ABNORMAL LOW (ref 6–20)
CO2: 28 mmol/L (ref 22–32)
Calcium: 9.1 mg/dL (ref 8.9–10.3)
Chloride: 105 mmol/L (ref 101–111)
Creatinine, Ser: 0.63 mg/dL (ref 0.61–1.24)
GFR calc Af Amer: >60 mL/min (ref >60)
Glucose, Bld: 105 mg/dL — ABNORMAL HIGH (ref 65–99)
POTASSIUM: 3.4 mmol/L — AB (ref 3.5–5.1)
SODIUM: 139 mmol/L (ref 135–145)

## 2016-02-15 MED ORDER — HYDROMORPHONE HCL 2 MG PO TABS
2.0000 mg | ORAL_TABLET | ORAL | Status: DC
  Administered 2016-02-15 – 2016-02-17 (×12): 2 mg via ORAL
  Filled 2016-02-15 (×12): qty 1

## 2016-02-15 MED ORDER — HYDROMORPHONE HCL 2 MG/ML IJ SOLN
2.0000 mg | INTRAMUSCULAR | Status: DC | PRN
  Administered 2016-02-16: 2 mg via INTRAVENOUS
  Filled 2016-02-15: qty 1

## 2016-02-15 NOTE — Progress Notes (Signed)
SICKLE CELL SERVICE PROGRESS NOTE  [name] WUJ:[id number] DOB: 1976/02/07 DOA: 02/11/2016 PCP: Malik German, MD  Assessment/Plan: Active Problems:   Sickle-cell/Hb-C disease with crisis (HCC)  1. Hb  with crisis: Will continue Toradol,  PCA, and Schedule oral Dilaudid. Decrease frequency of clincian assisted doses of Dilaudid on a PRN basis. .  2. Leukocytosis: Mild likely related to crisis.  3. Nausea and Vomiting:Resolved. Patient tolerating diet. Continue PRN anti-emetics. 4. Constipation:Pt had a BM in response to lactulose. Continue Lactulose PRN. 5. Anemia of chronic disease: Hb at baseline.  6. Chronic pain: Continue MS Contin.    Code Status: Full Code Family Communication: Father at bedside and updated. Disposition Plan:  Anticipate discharge in next 1-2 days.  [name] A.  Pager [phone]. If 7PM-7AM, please contact night-coverage.  02/15/2016, 1:01 PM  LOS: 4 days   Interim History: Pt reports pain contains to be mostly at 6/10  and localized to right side ribs.   Consultants:  None  Procedures:  None  Antibiotics:  None   Objective: Vitals:   02/15/16 0400 02/15/16 0601 02/15/16 0810 02/15/16 1034  BP:  128/78  (!) 144/92  Pulse:  87  86  Resp: 14 10 12 14   Temp:  98.2 F (36.8 C)  97.9 F (36.6 C)  TempSrc:  Oral  Oral  SpO2: 100% 97% 91% 100%  Weight:      Height:       Weight change:   Intake/Output Summary (Last 24 hours) at 02/15/16 1301 Last data filed at 02/15/16 1034  Gross per 24 hour  Intake              835 ml  Output             2000 ml  Net            -1165 ml    General: Alert, awake, oriented x3, in no acute distress.  HEENT: [place]/AT PEERL, EOMI, anicteric  Heart: Regular rate and rhythm, without murmurs, rubs, gallops, PMI non-displaced, no heaves or thrills on palpation.  Lungs: Clear to auscultation, no wheezing or rhonchi noted. No increased vocal fremitus resonant to percussion  Abdomen: Soft,  nontender, nondistended, positive bowel sounds, no masses no hepatosplenomegaly noted.  Neuro: No focal neurological deficits noted cranial nerves II through XII grossly intact.  Strength at baseline in bilateral upper and lower extremities. Musculoskeletal: No warmth swelling or erythema around joints, no spinal tenderness noted. Psychiatric: Patient alert and oriented x3, good insight and cognition, good recent to remote recall.    Data Reviewed: Basic Metabolic Panel:  Recent Labs Lab 02/11/16 1033 02/15/16 1133  NA 137 139  K 3.5 3.4*  CL 107 105  CO2 24 28  GLUCOSE 92 105*  BUN 14 5*  CREATININE 0.86 0.63  CALCIUM 9.1 9.1   Liver Function Tests:  Recent Labs Lab 02/11/16 1033  AST 24  ALT 14*  ALKPHOS 76  BILITOT 2.0*  PROT 8.3*  ALBUMIN 4.7   No results for input(s): LIPASE, AMYLASE in the last 168 hours. No results for input(s): AMMONIA in the last 168 hours. CBC:  Recent Labs Lab 02/11/16 1033 02/15/16 1133  WBC 10.8* 11.1*  NEUTROABS 6.6 6.5  HGB 10.7* 8.9*  HCT 28.9* 24.5*  MCV 87.0 88.8  PLT 301 266   Cardiac Enzymes: No results for input(s): CKTOTAL, CKMB, CKMBINDEX, TROPONINI in the last 168 hours. BNP (last 3 results) No results for input(s): BNP in the  last 8760 hours.  ProBNP (last 3 results) No results for input(s): PROBNP in the last 8760 hours.  CBG: No results for input(s): GLUCAP in the last 168 hours.  No results found for this or any previous visit (from the past 240 hour(s)).   Studies: Dg Chest 1 View  Result Date: 02/11/2016 CLINICAL DATA:  Pain on the right side.  Sickle cell history. EXAM: CHEST 1 VIEW COMPARISON:  12/05/2015 FINDINGS: Stable scarring peripherally in both lungs, right greater than left. Mildly tortuous thoracic aorta. Heart size within normal limits. Suspicion for avascular necrosis in the humeral heads. No new airspace opacity is identified in the lungs. IMPRESSION: 1. Stable scarring in both lungs  without acute findings. 2. 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 Electronically Signed   By: Malik RongWalter  [name] M.D.   On: 02/11/2016 11:49   Dg Pelvis 1-2 Views  Result Date: 02/11/2016 CLINICAL DATA:  40 year old male with sickle cell crisis with right-sided pain left hip pain for 3 days. Initial encounter. EXAM: PELVIS - 1-2 VIEW COMPARISON:  08/26/2011 FINDINGS: Post right hip replacement. 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 Sacroiliac joint degenerative changes/ bony infarcts similar to prior exam. IMPRESSION: Osseous changes similar to prior exam as noted above. Electronically Signed   By: Malik DuverneySteven  [name] M.D.   On: 02/11/2016 11:50    Scheduled Meds: . cyclobenzaprine  10 mg Oral QHS  . enoxaparin (LOVENOX) injection  40 mg Subcutaneous Q24H  . folic acid  1 mg Oral QPM  . HYDROmorphone   Intravenous Q4H  . HYDROmorphone  2 mg Oral Q4H  . ketorolac  30 mg Intravenous Q6H  . morphine  60 mg Oral BID  . senna-docusate  1 tablet Oral BID   Continuous Infusions: . dextrose 5 % and 0.45% NaCl 125 mL/hr at 02/15/16 1050    Active Problems:   Sickle-cell/Hb-C disease with crisis (HCC)  In excess of 25 minutes spent during visit. Greater than 50% involved face to face contact with the patient for assessment, counseling and coordination of care.

## 2016-02-16 MED ORDER — POTASSIUM CHLORIDE CRYS ER 20 MEQ PO TBCR
40.0000 meq | EXTENDED_RELEASE_TABLET | Freq: Two times a day (BID) | ORAL | Status: DC
  Administered 2016-02-17: 40 meq via ORAL
  Filled 2016-02-16: qty 2

## 2016-02-17 LAB — BASIC METABOLIC PANEL
ANION GAP: 7 (ref 5–15)
BUN: 7 mg/dL (ref 6–20)
CO2: 30 mmol/L (ref 22–32)
Calcium: 9.4 mg/dL (ref 8.9–10.3)
Chloride: 102 mmol/L (ref 101–111)
Creatinine, Ser: 0.7 mg/dL (ref 0.61–1.24)
Glucose, Bld: 90 mg/dL (ref 65–99)
Potassium: 3.7 mmol/L (ref 3.5–5.1)
SODIUM: 139 mmol/L (ref 135–145)

## 2016-02-17 MED ORDER — HYDROMORPHONE HCL 2 MG PO TABS: 2.0000 mg | ORAL_TABLET | Freq: Four times a day (QID) | ORAL | 0 refills | Status: DC | PRN

## 2016-02-17 MED ORDER — MORPHINE SULFATE 30 MG PO TABS: 30.0000 mg | ORAL_TABLET | Freq: Every day | ORAL | 0 refills | Status: AC | PRN

## 2016-02-17 NOTE — Discharge Summary (Signed)
[name] MRN: [id number] DOB/AGE: 40/02/1976 40 y.o.  Admit date: 02/11/2016 Discharge date: 02/17/2016  Primary Care Physician:  Dorrene German, MD   Discharge Diagnoses:   Patient Active Problem List   Diagnosis Date Noted  . Sickle-cell/Hb-C disease with crisis (HCC) 01/02/2016  . Rib pain on right side   . Cough   . 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 Sickle cell anemia (HCC) 08/13/2015  . Sickle cell disease with crisis (HCC) 05/25/2015  . Chest pain 02/08/2015  . CAP (community acquired pneumonia) 02/08/2015  . Sickle cell crisis acute chest syndrome (HCC) 02/08/2015  . HCAP (healthcare-associated pneumonia) 02/08/2015  . Shoulder pain, right 02/08/2015  . Numbness and tingling in right hand   . Hemoglobin [place] with crisis (HCC) 10/13/2014  . Sickle cell anemia with pain (HCC) 05/31/2014  . Sickle cell crisis (HCC) 10/24/2013  . Sickle cell disease, type [place] (HCC) 10/18/2012  . Sickle cell pain crisis (HCC) 10/13/2012  . History of tobacco abuse 10/13/2012  . Hypokalemia 05/02/2012  . Leukocytosis 05/02/2012  . Anemia 11/08/2011  . Avascular necrosis of hip, left (HCC) 08/27/2011  . Elevated brain natriuretic peptide (BNP) level 08/25/2011  . Acute chest syndrome (HCC) 05/15/2011  . Tobacco abuse 05/15/2011  . Constipation 05/14/2011  . Sickle cell anemia with crisis (HCC) 05/13/2011    DISCHARGE MEDICATION:   Medication List    TAKE these medications   cyclobenzaprine 10 MG tablet Commonly known as:  FLEXERIL Take 10 mg by mouth at bedtime.   folic acid 1 MG tablet Commonly known as:  FOLVITE Take 1 mg by mouth every evening.   HYDROmorphone 2 MG tablet Commonly known as:  DILAUDID Take 1 tablet (2 mg total) by mouth every 6 (six) hours as needed for severe pain. Resume use of Morphine Sulfate after completion of this medication   ibuprofen 800 MG tablet Commonly known as:  ADVIL,MOTRIN Take 800 mg by mouth 3 (three) times daily as  needed (PAIN).   morphine 60 MG 12 hr tablet Commonly known as:  MS CONTIN Take 1 tablet (60 mg total) by mouth 2 (two) times daily. What changed:  Another medication with the same name was changed. Make sure you understand how and when to take each.   morphine 30 MG tablet Commonly known as:  MSIR Take 1 tablet (30 mg total) by mouth daily as needed for severe pain (pain). Resume after completing dilaudid. What changed:  additional instructions   promethazine 25 MG tablet Commonly known as:  PHENERGAN Take 25 mg by mouth every 6 (six) hours as needed for nausea (nausea).         Consults:    SIGNIFICANT DIAGNOSTIC STUDIES:  Dg Chest 1 View  Result Date: 02/11/2016 CLINICAL DATA:  Pain on the right side.  Sickle cell history. EXAM: CHEST 1 VIEW COMPARISON:  12/05/2015 FINDINGS: Stable scarring peripherally in both lungs, right greater than left. Mildly tortuous thoracic aorta. Heart size within normal limits. Suspicion for avascular necrosis in the humeral heads. No new airspace opacity is identified in the lungs. IMPRESSION: 1. Stable scarring in both lungs without acute findings. 2. 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 Electronically Signed   By: Gaylyn Rong M.D.   On: 02/11/2016 11:49   Dg Pelvis 1-2 Views  Result Date: 02/11/2016 CLINICAL DATA:  40 year old male with sickle cell crisis with right-sided pain left hip pain for 3 days. Initial encounter. EXAM: PELVIS - 1-2 VIEW COMPARISON:  08/26/2011 FINDINGS: Post right hip replacement. 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 Sacroiliac joint degenerative changes/ bony infarcts similar to prior exam. IMPRESSION: Osseous changes similar to prior exam as noted above. Electronically Signed   By: [name]  Olson M.D.   On: 02/11/2016 11:50      No results found for this or any previous visit (from the past 240 hour(s)).  BRIEF ADMITTING H & P: Malik AguasHoward is an opiate  tolerant patient with Hb [place] who reports that he started having pain in his left hip and back x 3 days. Patient is known to have AVN of the left hip and feels that it is getting progressively worse. He reports that his current regimen did not control his pain and  Thus he presented to the ED for care. Currently his pain in localized to his left hip and back and is at an intensity of 9/10.  The pain is described as throbbing and occasionally sharp in nature like that of his usual sickle cell crisis. He denies any associated symptoms of cough, SOB, vomiting, emesis or diarrhea. He has no focal neurological deficits and denies headache, LOC, fever or chills.  In the ED an x-ray of the left hip showed osseous changes consistent with previous AVN changes. He received 3 doses of IV analgesics and his pain is still at 9/10. I am asked to admit him for sickle cell crisis   Hospital Course:  Present on Admission: . Sickle-cell/Hb-C disease with crisis [facility](HCC): Pt was initially treated with Dilaudid via PCA, Toradol and IVF. He had good response to the regimen although he had some emesis initially which was successfully treated with anti-emetics. As his pain improved, he was transitioned to oral Dilaudid with good control of his pain. At the time of discharge, his pain is at 4-5/10 and he feels that it is well controlled with the oral Dilaudid 2 mg. He is discharged home on Dilaudid 2 mg every 6 hours as needed #12 tabs. This has been discussed with his PMD Dr. Concepcion ElkAvbuere who agrees with issuing prescription for Dilaudid from me at the time of discharge. Pt may benefit from starting on Hydrea for better control of his sickle cell disease. Recommend referral to Hematologist who manages sickle cell disease frequently.   Disposition and Follow-up:  Pt is discharged in good condition and is to follow up with PMD on 03/18/2016. Discharge Instructions    Activity as tolerated - No restrictions    Complete by:  As directed     Diet general    Complete by:  As directed       DISCHARGE EXAM:  General: Alert, awake, oriented x3, in no apparent distress.  HEENT: [place]/AT PEERL, EOMI, anicteric Neck: Trachea midline, no masses, no thyromegal,y no JVD, no carotid bruit OROPHARYNX: Moist, No exudate/ erythema/lesions.  Heart: Regular rate and rhythm, without murmurs, rubs, gallops or S3. PMI non-displaced. Exam reveals no decreased pulses. Pulmonary/Chest: Normal effort. Breath sounds normal. No. Apnea. Clear to auscultation,no stridor,  no wheezing and no rhonchi noted. No respiratory distress and no tenderness noted. Abdomen: Soft, nontender, nondistended, normal bowel sounds, no masses no hepatosplenomegaly noted. No fluid wave and no ascites. There is no guarding or rebound. Neuro: Alert and oriented to person, place and time. Normal motor skills, Displays no atrophy or tremors and exhibits normal muscle tone.  No focal neurological deficits noted cranial nerves II through XII grossly intact. No sensory deficit noted.  Strength at baseline  in bilateral upper and lower extremities. Gait normal. Musculoskeletal: No warmth swelling or erythema around joints, no spinal tenderness noted. Psychiatric: Patient alert and oriented x3, good insight and cognition, good recent to remote recall. Mood, affect and judgement normal Lymph node survey: No cervical axillary or inguinal lymphadenopathy noted. Skin: Skin is warm and dry. No bruising, no ecchymosis and no rash noted. Pt is not diaphoretic. No erythema. No pallor    Blood pressure 124/76, pulse 93, temperature 97.7 F (36.5 C), temperature source Oral, resp. rate 18, height 6\' 2"  (1.88 m), weight 79.1 kg (174 lb 6.1 oz), SpO2 94 %.   Recent Labs  02/15/16 1133 02/17/16 0803  NA 139 139  K 3.4* 3.7  CL 105 102  CO2 28 30  GLUCOSE 105* 90  BUN 5* 7  CREATININE 0.63 0.70  CALCIUM 9.1 9.4   No results for input(s): AST, ALT, ALKPHOS, BILITOT, PROT, ALBUMIN in  the last 72 hours. No results for input(s): LIPASE, AMYLASE in the last 72 hours.  Recent Labs  02/15/16 1133  WBC 11.1*  NEUTROABS 6.5  HGB 8.9*  HCT 24.5*  MCV 88.8  PLT 266     Total time spent including face to face and decision making was greater than 30 minutes  Signed: Jazmen Lindenbaum A. 02/17/2016, 11:52 AM

## 2016-02-17 NOTE — Progress Notes (Signed)
Patient ambulated on room air.  Sats 96% ambulatory and 98% at rest on room air.

## 2016-02-17 NOTE — Care Management Important Message (Signed)
Important Message  Patient Details  Name: [name] MRN: [id number] Date of Birth: 1975/12/07   Medicare Important Message Given:  Yes    Haskell FlirtJamison, Roxsana Riding 02/17/2016, 10:10 AMImportant Message  Patient Details  Name: [name] MRN: [id number] Date of Birth: 1975/12/07   Medicare Important Message Given:  Yes    Haskell FlirtJamison, Lugene Beougher 02/17/2016, 10:10 AM

## 2016-02-17 NOTE — Progress Notes (Signed)
Pt discharged to home in stable condition via wheelchair to front door to meet father who is picking him up. Medications reviewed and questions answered.

## 2016-02-23 NOTE — ED Provider Notes (Signed)
[facility]-EMERGENCY DEPT Provider Note   CSN: [id number] Arrival date & time: 02/11/16  1007     History   Chief Complaint Chief Complaint  Patient presents with  . Sickle Cell Pain Crisis    HPI [name] is a 40 y.o. male. He presents to the ER with a chief complaint history of sickle cell anemia and history of right side and left hip pain. 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 Has known disease of his left hip chronic pain.  No chest pain short of breath or fever. No new fall injury or trauma. No change in baseline treatment. Compliant with medications at home per his report.  HPI  Past Medical History:  Diagnosis Date  . Avascular necrosis of hip (HCC)    bilateral  . Avascular necrosis of hip, left (HCC) 08/27/2011  . Blood transfusion   . Infection of bone, shoulder region (HCC)    left shoulder  . Pneumonia   . Sickle cell crisis [facility](HCC)     Patient Active Problem List   Diagnosis Date Noted  . Sickle-cell/Hb-C disease with crisis (HCC) 01/02/2016  . Rib pain on right side   . Cough   . 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 Sickle cell anemia (HCC) 08/13/2015  . Sickle cell disease with crisis (HCC) 05/25/2015  . Chest pain 02/08/2015  . CAP (community acquired pneumonia) 02/08/2015  . Sickle cell crisis acute chest syndrome (HCC) 02/08/2015  . HCAP (healthcare-associated pneumonia) 02/08/2015  . Shoulder pain, right 02/08/2015  . Numbness and tingling in right hand   . Hemoglobin [place] with crisis (HCC) 10/13/2014  . Sickle cell anemia with pain (HCC) 05/31/2014  . Sickle cell crisis (HCC) 10/24/2013  . Sickle cell disease, type [place] (HCC) 10/18/2012  . Sickle cell pain crisis (HCC) 10/13/2012  . History of tobacco abuse 10/13/2012  . Hypokalemia 05/02/2012  . Leukocytosis 05/02/2012  . Anemia 11/08/2011  . Avascular necrosis of hip, left (HCC) 08/27/2011  . Elevated brain natriuretic peptide (BNP)  level 08/25/2011  . Acute chest syndrome (HCC) 05/15/2011  . Tobacco abuse 05/15/2011  . Constipation 05/14/2011  . Sickle cell anemia with crisis (HCC) 05/13/2011    Past Surgical History:  Procedure Laterality Date  . BONE GRAFT HIP ILIAC CREST    . JOINT REPLACEMENT  2006   right total hip arthroplasty  . Orif right hip fracture  1995       Home Medications    Prior to Admission medications   Medication Sig Start Date End Date Taking? Authorizing Provider  cyclobenzaprine (FLEXERIL) 10 MG tablet Take 10 mg by mouth at bedtime.   Yes Historical Provider, MD  folic acid (FOLVITE) 1 MG tablet Take 1 mg by mouth every evening.    Yes Historical Provider, MD  ibuprofen (ADVIL,MOTRIN) 800 MG tablet Take 800 mg by mouth 3 (three) times daily as needed (PAIN).   Yes Historical Provider, MD  morphine (MS CONTIN) 60 MG 12 hr tablet Take 1 tablet (60 mg total) by mouth 2 (two) times daily. 11/20/11  Yes Grayce SessionsMichelle P Edwards, NP  promethazine (PHENERGAN) 25 MG tablet Take 25 mg by mouth every 6 (six) hours as needed for nausea (nausea).    Yes Historical Provider, MD  HYDROmorphone (DILAUDID) 2 MG tablet Take 1 tablet (2 mg total) by mouth every 6 (six) hours as needed for severe pain. Resume use of Morphine Sulfate after completion of this medication 02/17/16  Altha HarmMichelle A [name], MD  morphine (MSIR) 30 MG tablet Take 1 tablet (30 mg total) by mouth daily as needed for severe pain (pain). Resume after completing dilaudid. 02/17/16   Altha HarmMichelle A [name], MD    Family History Family History  Problem Relation Age of Onset  . Adopted: Yes    Social History Social History  Substance Use Topics  . Smoking status: Current Some Day Smoker    Packs/day: 0.50    Types: Cigarettes    Last attempt to quit: 01/27/2012  . Smokeless tobacco: Never Used  . Alcohol use No     Allergies   Review of patient's allergies indicates no known allergies.   Review of Systems Review of Systems    Constitutional: Negative for appetite change, chills, diaphoresis, fatigue and fever.  HENT: Negative for mouth sores, sore throat and trouble swallowing.   Eyes: Negative for visual disturbance.  Respiratory: Negative for cough, chest tightness, shortness of breath and wheezing.   Cardiovascular: Negative for chest pain.  Gastrointestinal: Negative for abdominal distention, abdominal pain, diarrhea, nausea and vomiting.  Endocrine: Negative for polydipsia, polyphagia and polyuria.  Genitourinary: Negative for dysuria, frequency and hematuria.  Musculoskeletal: Positive for arthralgias and gait problem.  Skin: Negative for color change, pallor and rash.  Neurological: Negative for dizziness, syncope, light-headedness and headaches.  Hematological: Does not bruise/bleed easily.  Psychiatric/Behavioral: Negative for behavioral problems and confusion.     Physical Exam Updated Vital Signs BP 124/76 (BP Location: Left Arm)   Pulse 93   Temp 97.7 F (36.5 C) (Oral)   Resp 14   Ht 6\' 2"  (1.88 m)   Wt 174 lb 6.1 oz (79.1 kg)   SpO2 94%   BMI 22.39 kg/m   Physical Exam  Constitutional: He is oriented to person, place, and time. He appears well-developed and well-nourished. No distress.  HENT:  Head: Normocephalic.  Eyes: Conjunctivae are normal. Pupils are equal, round, and reactive to light. No scleral icterus.  Neck: Normal range of motion. Neck supple. No thyromegaly present.  Cardiovascular: Normal rate and regular rhythm.  Exam reveals no gallop and no friction rub.   No murmur heard. Pulmonary/Chest: Effort normal and breath sounds normal. No respiratory distress. He has no wheezes. He has no rales.  Abdominal: Soft. Bowel sounds are normal. He exhibits no distension. There is no tenderness. There is no rebound.  Musculoskeletal: Normal range of motion.  Plans the pain with range of motion but exhibits full range of motion of bilateral hips.  Neurological: He is alert and  oriented to person, place, and time.  Skin: Skin is warm and dry. No rash noted.  Psychiatric: He has a normal mood and affect. His behavior is normal.     ED Treatments / Results  Labs (all labs ordered are listed, but only abnormal results are displayed) Labs Reviewed  COMPREHENSIVE METABOLIC PANEL - Abnormal; Notable for the following:       Result Value   Total Protein 8.3 (*)    ALT 14 (*)    Total Bilirubin 2.0 (*)    All other components within normal limits  CBC WITH DIFFERENTIAL/PLATELET - Abnormal; Notable for the following:    WBC 10.8 (*)    RBC 3.32 (*)    Hemoglobin 10.7 (*)    HCT 28.9 (*)    MCHC 37.0 (*)    All other components within normal limits  RETICULOCYTES - Abnormal; Notable for the following:    Retic Ct Pct  3.2 (*)    RBC. 3.34 (*)    All other components within normal limits  CBC WITH DIFFERENTIAL/PLATELET - Abnormal; Notable for the following:    WBC 11.1 (*)    RBC 2.76 (*)    Hemoglobin 8.9 (*)    HCT 24.5 (*)    MCHC 36.3 (*)    RDW 15.7 (*)    Eosinophils Absolute 1.3 (*)    All other components within normal limits  RETICULOCYTES - Abnormal; Notable for the following:    Retic Ct Pct 5.8 (*)    RBC. 2.76 (*)    All other components within normal limits  BASIC METABOLIC PANEL - Abnormal; Notable for the following:    Potassium 3.4 (*)    Glucose, Bld 105 (*)    BUN 5 (*)    All other components within normal limits  BASIC METABOLIC PANEL    EKG  EKG Interpretation None       Radiology No results found.  Procedures Procedures (including critical care time)  Medications Ordered in ED Medications  ketorolac (TORADOL) 30 MG/ML injection 30 mg (30 mg Intravenous Given 02/11/16 1128)  diphenhydrAMINE (BENADRYL) injection 25 mg (25 mg Intravenous Given 02/11/16 1128)  HYDROmorphone (DILAUDID) injection 2.5 mg (2.5 mg Intravenous Given 02/11/16 1200)  HYDROmorphone (DILAUDID) injection 3 mg (3 mg Intravenous Given 02/11/16  1126)  HYDROmorphone (DILAUDID) injection 3 mg (3 mg Intravenous Given 02/11/16 1244)  HYDROmorphone (DILAUDID) injection 2 mg (2 mg Intravenous Given 02/11/16 1500)  ketorolac (TORADOL) 30 MG/ML injection 30 mg (30 mg Intravenous Given 02/16/16 1322)  HYDROmorphone (DILAUDID) injection 2 mg (2 mg Intravenous Given 02/11/16 1730)     Initial Impression / Assessment and Plan / ED Course  I have reviewed the triage vital signs and the nursing notes.  Pertinent labs & imaging results that were available during my care of the patient were reviewed by me and considered in my medical decision making (see chart for details).  Clinical Course    Patient stated an IV fluids. Given incremental doses of escalating quantities of Dilaudid. Has poor symptom control. X-ray show worsening collapse and avascular necrosis of the left hip. I discussed the case with Dr. Ashley Royalty. Dr. Ashley Royalty as always return my call immediately if she presents to emergency room and is planning admission.  Final Clinical Impressions(s) / ED Diagnoses   Final diagnoses:  Sickle cell pain crisis [facility])    New Prescriptions Discharge Medication List as of 02/17/2016 12:36 PM    START taking these medications   Details  HYDROmorphone (DILAUDID) 2 MG tablet Take 1 tablet (2 mg total) by mouth every 6 (six) hours as needed for severe pain. Resume use of Morphine Sulfate after completion of this medication, Starting Mon 02/17/2016, Print         Rolland Porter, MD 02/23/16 2026

## 2016-03-22 ENCOUNTER — Emergency Department (HOSPITAL_COMMUNITY): Payer: Medicare Other

## 2016-03-22 ENCOUNTER — Inpatient Hospital Stay (HOSPITAL_COMMUNITY)
Admission: EM | Admit: 2016-03-22 | Discharge: 2016-03-26 | DRG: 812 | Disposition: A | Discharge status: Home / Self Care | Payer: Medicare Other | Attending: Internal Medicine | Admitting: Internal Medicine

## 2016-03-22 ENCOUNTER — Encounter (HOSPITAL_COMMUNITY): Payer: Self-pay

## 2016-03-22 DIAGNOSIS — F17210 Nicotine dependence, cigarettes, uncomplicated: Secondary | ICD-10-CM | POA: Diagnosis present

## 2016-03-22 DIAGNOSIS — Z96641 Presence of right artificial hip joint: Secondary | ICD-10-CM | POA: Diagnosis present

## 2016-03-22 DIAGNOSIS — R112 Nausea with vomiting, unspecified: Secondary | ICD-10-CM | POA: Diagnosis present

## 2016-03-22 DIAGNOSIS — D72829 Elevated white blood cell count, unspecified: Secondary | ICD-10-CM | POA: Diagnosis present

## 2016-03-22 DIAGNOSIS — R0602 Shortness of breath: Secondary | ICD-10-CM | POA: Diagnosis not present

## 2016-03-22 DIAGNOSIS — D5700 Hb-SS disease with crisis, unspecified: Secondary | ICD-10-CM | POA: Diagnosis present

## 2016-03-22 DIAGNOSIS — Z720 Tobacco use: Secondary | ICD-10-CM | POA: Diagnosis not present

## 2016-03-22 DIAGNOSIS — M879 Osteonecrosis, unspecified: Secondary | ICD-10-CM | POA: Diagnosis present

## 2016-03-22 DIAGNOSIS — D638 Anemia in other chronic diseases classified elsewhere: Secondary | ICD-10-CM | POA: Diagnosis present

## 2016-03-22 DIAGNOSIS — D571 Sickle-cell disease without crisis: Secondary | ICD-10-CM | POA: Diagnosis present

## 2016-03-22 DIAGNOSIS — R001 Bradycardia, unspecified: Secondary | ICD-10-CM | POA: Diagnosis present

## 2016-03-22 DIAGNOSIS — Z79899 Other long term (current) drug therapy: Secondary | ICD-10-CM | POA: Diagnosis not present

## 2016-03-22 DIAGNOSIS — K5900 Constipation, unspecified: Secondary | ICD-10-CM | POA: Diagnosis present

## 2016-03-22 LAB — CBC WITH DIFFERENTIAL/PLATELET
Basophils Absolute: 0.1 10*3/uL (ref 0.0–0.1)
Basophils Relative: 0 %
Eosinophils Absolute: 0.5 10*3/uL (ref 0.0–0.7)
Eosinophils Relative: 5 %
HEMATOCRIT: 29.6 % — AB (ref 39.0–52.0)
HEMOGLOBIN: 10.7 g/dL — AB (ref 13.0–17.0)
LYMPHS ABS: 3 10*3/uL (ref 0.7–4.0)
LYMPHS PCT: 27 %
MCH: 32.4 pg (ref 26.0–34.0)
MCHC: 36.1 g/dL — ABNORMAL HIGH (ref 30.0–36.0)
MCV: 89.7 fL (ref 78.0–100.0)
Monocytes Absolute: 1 10*3/uL (ref 0.1–1.0)
Monocytes Relative: 9 %
NEUTROS ABS: 6.7 10*3/uL (ref 1.7–7.7)
NEUTROS PCT: 59 %
Platelets: 308 10*3/uL (ref 150–400)
RBC: 3.3 MIL/uL — AB (ref 4.22–5.81)
RDW: 14.6 % (ref 11.5–15.5)
WBC: 11.3 10*3/uL — AB (ref 4.0–10.5)

## 2016-03-22 LAB — RETICULOCYTES
RBC.: 3.3 MIL/uL — ABNORMAL LOW (ref 4.22–5.81)
RETIC CT PCT: 2.5 % (ref 0.4–3.1)
Retic Count, Absolute: 82.5 10*3/uL (ref 19.0–186.0)

## 2016-03-22 LAB — COMPREHENSIVE METABOLIC PANEL
ALT: 11 U/L — ABNORMAL LOW (ref 17–63)
AST: 20 U/L (ref 15–41)
Albumin: 4.5 g/dL (ref 3.5–5.0)
Alkaline Phosphatase: 72 U/L (ref 38–126)
Anion gap: 8 (ref 5–15)
BUN: 12 mg/dL (ref 6–20)
CHLORIDE: 104 mmol/L (ref 101–111)
CO2: 25 mmol/L (ref 22–32)
Calcium: 8.9 mg/dL (ref 8.9–10.3)
Creatinine, Ser: 0.84 mg/dL (ref 0.61–1.24)
Glucose, Bld: 89 mg/dL (ref 65–99)
POTASSIUM: 3.2 mmol/L — AB (ref 3.5–5.1)
SODIUM: 137 mmol/L (ref 135–145)
Total Bilirubin: 2.1 mg/dL — ABNORMAL HIGH (ref 0.3–1.2)
Total Protein: 7.8 g/dL (ref 6.5–8.1)

## 2016-03-22 MED ORDER — POLYETHYLENE GLYCOL 3350 17 G PO PACK
17.0000 g | PACK | Freq: Every day | ORAL | Status: DC | PRN
Start: 2016-03-22 — End: 2016-03-26

## 2016-03-22 MED ORDER — KETOROLAC TROMETHAMINE 30 MG/ML IJ SOLN
30.0000 mg | Freq: Four times a day (QID) | INTRAMUSCULAR | Status: DC
  Administered 2016-03-23 – 2016-03-26 (×15): 30 mg via INTRAVENOUS
  Filled 2016-03-22 (×15): qty 1

## 2016-03-22 MED ORDER — HYDROMORPHONE 1 MG/ML IV SOLN
INTRAVENOUS | Status: DC
  Administered 2016-03-22: 16:00:00 via INTRAVENOUS
  Administered 2016-03-22: 10.4 mg via INTRAVENOUS
  Administered 2016-03-23: 08:00:00 via INTRAVENOUS
  Administered 2016-03-23: 4.8 mg via INTRAVENOUS
  Administered 2016-03-23 (×2): 3.2 mg via INTRAVENOUS
  Filled 2016-03-22 (×2): qty 25

## 2016-03-22 MED ORDER — SODIUM CHLORIDE 0.9% FLUSH: 9.0000 mL | INTRAVENOUS | Status: DC | PRN

## 2016-03-22 MED ORDER — HYDROMORPHONE HCL 2 MG/ML IJ SOLN: 2.0000 mg | INTRAMUSCULAR | Status: AC

## 2016-03-22 MED ORDER — HYDROMORPHONE HCL 2 MG/ML IJ SOLN
2.0000 mg | INTRAMUSCULAR | Status: AC
  Filled 2016-03-22: qty 1

## 2016-03-22 MED ORDER — FOLIC ACID 1 MG PO TABS
1.0000 mg | ORAL_TABLET | Freq: Every evening | ORAL | Status: DC
  Administered 2016-03-22 – 2016-03-25 (×4): 1 mg via ORAL
  Filled 2016-03-22 (×4): qty 1

## 2016-03-22 MED ORDER — SENNOSIDES-DOCUSATE SODIUM 8.6-50 MG PO TABS
1.0000 | ORAL_TABLET | Freq: Two times a day (BID) | ORAL | Status: DC
  Administered 2016-03-22 – 2016-03-26 (×9): 1 via ORAL
  Filled 2016-03-22 (×9): qty 1

## 2016-03-22 MED ORDER — NALOXONE HCL 0.4 MG/ML IJ SOLN: 0.4000 mg | INTRAMUSCULAR | Status: DC | PRN

## 2016-03-22 MED ORDER — MORPHINE SULFATE ER 30 MG PO TBCR
60.0000 mg | EXTENDED_RELEASE_TABLET | Freq: Two times a day (BID) | ORAL | Status: DC
  Administered 2016-03-22 – 2016-03-26 (×9): 60 mg via ORAL
  Filled 2016-03-22 (×9): qty 2

## 2016-03-22 MED ORDER — HYDROMORPHONE HCL 2 MG/ML IJ SOLN: 2.0000 mg | INTRAMUSCULAR | Status: DC

## 2016-03-22 MED ORDER — HYDROMORPHONE HCL 2 MG/ML IJ SOLN
2.0000 mg | INTRAMUSCULAR | Status: DC
  Administered 2016-03-22 – 2016-03-23 (×12): 2 mg via INTRAVENOUS
  Filled 2016-03-22 (×11): qty 1

## 2016-03-22 MED ORDER — CYCLOBENZAPRINE HCL 10 MG PO TABS
10.0000 mg | ORAL_TABLET | Freq: Every day | ORAL | Status: DC
  Administered 2016-03-22 – 2016-03-26 (×4): 10 mg via ORAL
  Filled 2016-03-22 (×4): qty 1

## 2016-03-22 MED ORDER — ENOXAPARIN SODIUM 40 MG/0.4ML ~~LOC~~ SOLN
40.0000 mg | SUBCUTANEOUS | Status: DC
  Administered 2016-03-24: 40 mg via SUBCUTANEOUS
  Filled 2016-03-22 (×3): qty 0.4

## 2016-03-22 MED ORDER — KETOROLAC TROMETHAMINE 30 MG/ML IJ SOLN
30.0000 mg | Freq: Once | INTRAMUSCULAR | Status: AC
  Administered 2016-03-22: 30 mg via INTRAVENOUS
  Filled 2016-03-22: qty 1

## 2016-03-22 MED ORDER — POTASSIUM CHLORIDE CRYS ER 20 MEQ PO TBCR
40.0000 meq | EXTENDED_RELEASE_TABLET | Freq: Once | ORAL | Status: AC
  Administered 2016-03-22: 40 meq via ORAL
  Filled 2016-03-22: qty 2

## 2016-03-22 MED ORDER — DIPHENHYDRAMINE HCL 25 MG PO CAPS
25.0000 mg | ORAL_CAPSULE | ORAL | Status: DC | PRN
  Administered 2016-03-22: 25 mg via ORAL
  Filled 2016-03-22: qty 1

## 2016-03-22 MED ORDER — SODIUM CHLORIDE 0.45 % IV SOLN
INTRAVENOUS | Status: DC
  Administered 2016-03-22 – 2016-03-25 (×7): via INTRAVENOUS
  Administered 2016-03-26: 500 mL via INTRAVENOUS

## 2016-03-22 MED ORDER — PROMETHAZINE HCL 25 MG/ML IJ SOLN
12.5000 mg | Freq: Once | INTRAMUSCULAR | Status: AC
  Administered 2016-03-22: 12.5 mg via INTRAVENOUS
  Filled 2016-03-22: qty 1

## 2016-03-22 MED ORDER — HYDROMORPHONE HCL 2 MG/ML IJ SOLN
2.0000 mg | INTRAMUSCULAR | Status: AC
  Administered 2016-03-22: 2 mg via INTRAVENOUS
  Filled 2016-03-22: qty 1

## 2016-03-22 MED ORDER — ONDANSETRON HCL 4 MG/2ML IJ SOLN
4.0000 mg | INTRAMUSCULAR | Status: DC | PRN
  Administered 2016-03-22 – 2016-03-24 (×7): 4 mg via INTRAVENOUS
  Filled 2016-03-22 (×7): qty 2

## 2016-03-22 NOTE — ED Notes (Signed)
Patient transported to X-ray 

## 2016-03-22 NOTE — H&P (Signed)
[name] is an 40 y.o. male.   Chief Complaint: pain in his back and legs HPI:  40 year old gentleman with history of sickle cell disease presenting with sickle cell painful crisis.he has pain of 10 out of 10 in his back and legs. He has taken his home medications without relief. He was seen in the emergency room and given multiple doses of Dilaudid but no relief.patient is therefore being admitted to the hospital for further treatment. Denies any fever or chills no nausea vomiting or diarrhea.  Past Medical History:  Diagnosis Date  . Avascular necrosis of hip ([place])    bilateral  . Avascular necrosis of hip, left ([place]) 08/27/2011  . Blood transfusion   . Infection of bone, shoulder region ([place])    left shoulder  . Pneumonia   . Sickle cell crisis [facility])     Past Surgical History:  Procedure Laterality Date  . BONE GRAFT HIP ILIAC CREST    . JOINT REPLACEMENT  2006   right total hip arthroplasty  . Orif right hip fracture  1995    Family History  Problem Relation Age of Onset  . Adopted: Yes   Social History:  reports that he has been smoking Cigarettes.  He has been smoking about 0.50 packs per day. He has never used smokeless tobacco. He reports that he does not drink alcohol or use drugs.  Allergies: No Known Allergies   (Not in a hospital admission)  Results for orders placed or performed during the hospital encounter of 03/22/16 (from the past 48 hour(s))  Comprehensive metabolic panel     Status: Abnormal   Collection Time: 03/22/16 10:10 AM  Result Value Ref Range   Sodium 137 135 - 145 mmol/L   Potassium 3.2 (L) 3.5 - 5.1 mmol/L   Chloride 104 101 - 111 mmol/L   CO2 25 22 - 32 mmol/L   Glucose, Bld 89 65 - 99 mg/dL   BUN 12 6 - 20 mg/dL   Creatinine, Ser 0.84 0.61 - 1.24 mg/dL   Calcium 8.9 8.9 - 10.3 mg/dL   Total Protein 7.8 6.5 - 8.1 g/dL   Albumin 4.5 3.5 - 5.0 g/dL   AST 20 15 - 41 U/L   ALT 11 (L) 17 - 63 U/L   Alkaline Phosphatase 72 38 - 126 U/L    Total Bilirubin 2.1 (H) 0.3 - 1.2 mg/dL   GFR calc non Af Amer >60 >60 mL/min   GFR calc Af Amer >60 >60 mL/min    Comment: (NOTE) The eGFR has been calculated using the CKD EPI equation. This calculation has not been validated in all clinical situations. eGFR's persistently <60 mL/min signify possible Chronic Kidney Disease.    Anion gap 8 5 - 15  CBC with Differential     Status: Abnormal   Collection Time: 03/22/16 10:10 AM  Result Value Ref Range   WBC 11.3 (H) 4.0 - 10.5 K/uL   RBC 3.30 (L) 4.22 - 5.81 MIL/uL   Hemoglobin 10.7 (L) 13.0 - 17.0 g/dL   HCT 29.6 (L) 39.0 - 52.0 %   MCV 89.7 78.0 - 100.0 fL   MCH 32.4 26.0 - 34.0 pg   MCHC 36.1 (H) 30.0 - 36.0 g/dL   RDW 14.6 11.5 - 15.5 %   Platelets 308 150 - 400 K/uL   Neutrophils Relative % 59 %   Neutro Abs 6.7 1.7 - 7.7 K/uL   Lymphocytes Relative 27 %   Lymphs Abs 3.0  0.7 - 4.0 K/uL   Monocytes Relative 9 %   Monocytes Absolute 1.0 0.1 - 1.0 K/uL   Eosinophils Relative 5 %   Eosinophils Absolute 0.5 0.0 - 0.7 K/uL   Basophils Relative 0 %   Basophils Absolute 0.1 0.0 - 0.1 K/uL  Reticulocytes     Status: Abnormal   Collection Time: 03/22/16 10:10 AM  Result Value Ref Range   Retic Ct Pct 2.5 0.4 - 3.1 %   RBC. 3.30 (L) 4.22 - 5.81 MIL/uL   Retic Count, Manual 82.5 19.0 - 186.0 K/uL   Dg Chest 2 View  Result Date: 03/22/2016 CLINICAL DATA:  Patient with sickle cell crisis. Right-sided chest pain. Cough and congestion. EXAM: CHEST  2 VIEW COMPARISON:  Chest radiograph 02/11/2016. FINDINGS: Monitoring leads overlie the patient. Normal cardiac and mediastinal contours. Peripheral scarring right mid lung. No large area of pulmonary consolidation. No pleural effusion or pneumothorax. No aggressive or acute appearing osseous lesions. 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 IMPRESSION: No acute cardiopulmonary process. Electronically Signed   By: Lovey Newcomer M.D.   On: 03/22/2016 10:06    Review of Systems   Constitutional: Negative.   HENT: Negative.   Eyes: Negative.   Respiratory: Negative.   Cardiovascular: Negative.   Gastrointestinal: Negative.   Genitourinary: Negative.   Musculoskeletal: Positive for back pain, myalgias and neck pain.  Skin: Negative.   Neurological: Negative.   Endo/Heme/Allergies: Negative.   Psychiatric/Behavioral: Negative.     Blood pressure 114/76, pulse (!) 58, temperature 97.6 F (36.4 C), temperature source Oral, resp. rate 12, height 6' 2"  (1.88 m), weight 79.4 kg (175 lb), SpO2 96 %. Physical Exam  Constitutional: He is oriented to person, place, and time. He appears well-developed and well-nourished.  HENT:  Head: Normocephalic and atraumatic.  Eyes: Conjunctivae and EOM are normal. Pupils are equal, round, and reactive to light.  Neck: Normal range of motion. Neck supple.  Cardiovascular: Normal rate and regular rhythm.   Respiratory: Effort normal and breath sounds normal.  GI: Soft. Bowel sounds are normal.  Musculoskeletal: Normal range of motion.  Neurological: He is alert and oriented to person, place, and time.  Skin: Skin is warm and dry.  Psychiatric: He has a normal mood and affect.     Assessment/Plan  40 year old gentleman here with sickle cell painful crisis.  #1 sickle cell painful crisis:patient will be admitted for pain management.we'll start Dilaudid PCA with Toradol and IV fluids. He is opiate tolerant so I will keep him on his MS Contin. Reassess pain in am  #2 sickle cell anemia: H&H appears to be stable at baseline.continue monitorng  #3 bradycardia: probably transient. Watch patient closely. He is asymptomatic.  #4 tobacco abuse: Counseling provided.    Barbette Merino, MD 03/22/2016, 1:13 PM

## 2016-03-22 NOTE — ED Triage Notes (Signed)
Pt here with sickle cell pain.  States pain is in rt rib cage.  Started on Tuesday.

## 2016-03-22 NOTE — ED Provider Notes (Signed)
[facility]-EMERGENCY DEPT Provider Note   CSN: [id number] Arrival date & time: 03/22/16  [id number]     History   Chief Complaint Chief Complaint  Patient presents with  . Sickle Cell Pain Crisis    HPI [name] is a 40 y.o. male.  40 year old male with history of sickle cell disease presents with pain in his right ribs consistent with his chest crisis. Denies any fever or cough or congestion. No rashes to the area. Denies any trauma. Does have a history of acute chest syndrome but this is different. Has used his home medications without. Denies any severe headaches or neurological changes. Nothing makes the symptoms worse.      Past Medical History:  Diagnosis Date  . Avascular necrosis of hip (HCC)    bilateral  . Avascular necrosis of hip, left (HCC) 08/27/2011  . Blood transfusion   . Infection of bone, shoulder region (HCC)    left shoulder  . Pneumonia   . Sickle cell crisis [facility](HCC)     Patient Active Problem List   Diagnosis Date Noted  . Sickle-cell/Hb-C disease with crisis (HCC) 01/02/2016  . Rib pain on right side   . Cough   . 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 Sickle cell anemia (HCC) 08/13/2015  . Sickle cell disease with crisis (HCC) 05/25/2015  . Chest pain 02/08/2015  . CAP (community acquired pneumonia) 02/08/2015  . Sickle cell crisis acute chest syndrome (HCC) 02/08/2015  . HCAP (healthcare-associated pneumonia) 02/08/2015  . Shoulder pain, right 02/08/2015  . Numbness and tingling in right hand   . Hemoglobin [place] with crisis (HCC) 10/13/2014  . Sickle cell anemia with pain (HCC) 05/31/2014  . Sickle cell crisis (HCC) 10/24/2013  . Sickle cell disease, type [place] (HCC) 10/18/2012  . Sickle cell pain crisis (HCC) 10/13/2012  . History of tobacco abuse 10/13/2012  . Hypokalemia 05/02/2012  . Leukocytosis 05/02/2012  . Anemia 11/08/2011  . Avascular necrosis of hip, left (HCC) 08/27/2011  . Elevated brain natriuretic peptide (BNP) level  08/25/2011  . Acute chest syndrome (HCC) 05/15/2011  . Tobacco abuse 05/15/2011  . Constipation 05/14/2011  . Sickle cell anemia with crisis (HCC) 05/13/2011    Past Surgical History:  Procedure Laterality Date  . BONE GRAFT HIP ILIAC CREST    . JOINT REPLACEMENT  2006   right total hip arthroplasty  . Orif right hip fracture  1995       Home Medications    Prior to Admission medications   Medication Sig Start Date End Date Taking? Authorizing Provider  cyclobenzaprine (FLEXERIL) 10 MG tablet Take 10 mg by mouth at bedtime.   Yes Historical Provider, MD  folic acid (FOLVITE) 1 MG tablet Take 1 mg by mouth every evening.    Yes Historical Provider, MD  ibuprofen (ADVIL,MOTRIN) 800 MG tablet Take 800 mg by mouth 3 (three) times daily as needed (PAIN).   Yes Historical Provider, MD  morphine (MS CONTIN) 60 MG 12 hr tablet Take 1 tablet (60 mg total) by mouth 2 (two) times daily. 11/20/11  Yes Grayce SessionsMichelle P Edwards, NP  morphine (MSIR) 30 MG tablet Take 1 tablet (30 mg total) by mouth daily as needed for severe pain (pain). Resume after completing dilaudid. 02/17/16  Yes Altha HarmMichelle A [name], MD  promethazine (PHENERGAN) 25 MG tablet Take 25 mg by mouth every 6 (six) hours as needed for nausea (nausea).    Yes Historical Provider, MD  HYDROmorphone (DILAUDID) 2 MG tablet Take  1 tablet (2 mg total) by mouth every 6 (six) hours as needed for severe pain. Resume use of Morphine Sulfate after completion of this medication Patient not taking: Reported on 03/22/2016 02/17/16   Altha HarmMichelle A [name], MD    Family History Family History  Problem Relation Age of Onset  . Adopted: Yes    Social History Social History  Substance Use Topics  . Smoking status: Current Some Day Smoker    Packs/day: 0.50    Types: Cigarettes    Last attempt to quit: 01/27/2012  . Smokeless tobacco: Never Used  . Alcohol use No     Allergies   Patient has no known allergies.   Review of Systems Review of  Systems  All other systems reviewed and are negative.    Physical Exam Updated Vital Signs BP 121/91 (BP Location: Left Arm)   Pulse 80   Temp 97.6 F (36.4 C) (Oral)   Resp 18   Ht 6\' 2"  (1.88 m)   Wt 79.4 kg   SpO2 100%   BMI 22.47 kg/m   Physical Exam  Constitutional: He is oriented to person, place, and time. He appears well-developed and well-nourished.  Non-toxic appearance. No distress.  HENT:  Head: Normocephalic and atraumatic.  Eyes: Conjunctivae, EOM and lids are normal. Pupils are equal, round, and reactive to light.  Neck: Normal range of motion. Neck supple. No tracheal deviation present. No thyroid mass present.  Cardiovascular: Normal rate, regular rhythm and normal heart sounds.  Exam reveals no gallop.   No murmur heard. Pulmonary/Chest: Effort normal and breath sounds normal. No stridor. No respiratory distress. He has no decreased breath sounds. He has no wheezes. He has no rhonchi. He has no rales.  Abdominal: Soft. Normal appearance and bowel sounds are normal. He exhibits no distension. There is no tenderness. There is no rebound and no CVA tenderness.  Musculoskeletal: Normal range of motion. He exhibits no edema or tenderness.  Neurological: He is alert and oriented to person, place, and time. He has normal strength. No cranial nerve deficit or sensory deficit. GCS eye subscore is 4. GCS verbal subscore is 5. GCS motor subscore is 6.  Skin: Skin is warm and dry. No abrasion and no rash noted.  Psychiatric: He has a normal mood and affect. His speech is normal and behavior is normal.  Nursing note and vitals reviewed.    ED Treatments / Results  Labs (all labs ordered are listed, but only abnormal results are displayed) Labs Reviewed  COMPREHENSIVE METABOLIC PANEL  CBC WITH DIFFERENTIAL/PLATELET  RETICULOCYTES    EKG  EKG Interpretation None       Radiology No results found.  Procedures Procedures (including critical care  time)  Medications Ordered in ED Medications  0.45 % sodium chloride infusion (not administered)  HYDROmorphone (DILAUDID) injection 2 mg (not administered)    Or  HYDROmorphone (DILAUDID) injection 2 mg (not administered)  HYDROmorphone (DILAUDID) injection 2 mg (not administered)    Or  HYDROmorphone (DILAUDID) injection 2 mg (not administered)  HYDROmorphone (DILAUDID) injection 2 mg (not administered)    Or  HYDROmorphone (DILAUDID) injection 2 mg (not administered)  HYDROmorphone (DILAUDID) injection 2 mg (not administered)    Or  HYDROmorphone (DILAUDID) injection 2 mg (not administered)  diphenhydrAMINE (BENADRYL) capsule 25-50 mg (not administered)  ondansetron (ZOFRAN) injection 4 mg (not administered)     Initial Impression / Assessment and Plan / ED Course  I have reviewed the triage vital signs and the  nursing notes.  Pertinent labs & imaging results that were available during my care of the patient were reviewed by me and considered in my medical decision making (see chart for details).  Clinical Course     Patient given IV hydration here as well as 3 doses of hydromorphone followed by Toradol. Continues to plan of pain and while patient admitted by the sickle cell team  Final Clinical Impressions(s) / ED Diagnoses   Final diagnoses:  SOB (shortness of breath)    New Prescriptions New Prescriptions   No medications on file     Lorre Nick, MD 03/22/16 1306

## 2016-03-22 NOTE — ED Notes (Signed)
Report given to [place]. Pt ready for transport.

## 2016-03-22 NOTE — ED Notes (Addendum)
Waiting for us IV after 1 failed attempt. Pt refuses additional attempt unless US. RN notified.

## 2016-03-23 DIAGNOSIS — D72829 Elevated white blood cell count, unspecified: Secondary | ICD-10-CM

## 2016-03-23 LAB — URINALYSIS, ROUTINE W REFLEX MICROSCOPIC
BILIRUBIN URINE: NEGATIVE
GLUCOSE, UA: NEGATIVE mg/dL
HGB URINE DIPSTICK: NEGATIVE
KETONES UR: NEGATIVE mg/dL
Leukocytes, UA: NEGATIVE
Nitrite: NEGATIVE
PROTEIN: NEGATIVE mg/dL
Specific Gravity, Urine: 1.009 (ref 1.005–1.030)
pH: 5.5 (ref 5.0–8.0)

## 2016-03-23 LAB — COMPREHENSIVE METABOLIC PANEL
ALBUMIN: 4.2 g/dL (ref 3.5–5.0)
ALT: 10 U/L — AB (ref 17–63)
AST: 17 U/L (ref 15–41)
Alkaline Phosphatase: 70 U/L (ref 38–126)
Anion gap: 5 (ref 5–15)
BILIRUBIN TOTAL: 2.4 mg/dL — AB (ref 0.3–1.2)
BUN: 7 mg/dL (ref 6–20)
CHLORIDE: 108 mmol/L (ref 101–111)
CO2: 25 mmol/L (ref 22–32)
CREATININE: 0.7 mg/dL (ref 0.61–1.24)
Calcium: 8.7 mg/dL — ABNORMAL LOW (ref 8.9–10.3)
GFR calc Af Amer: >60 mL/min (ref >60)
GLUCOSE: 83 mg/dL (ref 65–99)
POTASSIUM: 4.1 mmol/L (ref 3.5–5.1)
Sodium: 138 mmol/L (ref 135–145)
Total Protein: 7.4 g/dL (ref 6.5–8.1)

## 2016-03-23 LAB — CBC WITH DIFFERENTIAL/PLATELET
Basophils Absolute: 0 10*3/uL (ref 0.0–0.1)
Basophils Relative: 0 %
EOS ABS: 0.7 10*3/uL (ref 0.0–0.7)
EOS PCT: 7 %
HCT: 26.5 % — ABNORMAL LOW (ref 39.0–52.0)
Hemoglobin: 9.8 g/dL — ABNORMAL LOW (ref 13.0–17.0)
LYMPHS ABS: 3.4 10*3/uL (ref 0.7–4.0)
LYMPHS PCT: 31 %
MCH: 32.3 pg (ref 26.0–34.0)
MCHC: 37 g/dL — AB (ref 30.0–36.0)
MCV: 87.5 fL (ref 78.0–100.0)
MONO ABS: 1.1 10*3/uL — AB (ref 0.1–1.0)
Monocytes Relative: 10 %
Neutro Abs: 5.7 10*3/uL (ref 1.7–7.7)
Neutrophils Relative %: 52 %
PLATELETS: 241 10*3/uL (ref 150–400)
RBC: 3.03 MIL/uL — ABNORMAL LOW (ref 4.22–5.81)
RDW: 15.1 % (ref 11.5–15.5)
WBC: 10.9 10*3/uL — AB (ref 4.0–10.5)

## 2016-03-23 MED ORDER — HYDROMORPHONE 1 MG/ML IV SOLN
INTRAVENOUS | Status: DC
  Administered 2016-03-23: 2.1 mg via INTRAVENOUS
  Administered 2016-03-23: 4.9 mg via INTRAVENOUS
  Administered 2016-03-23: 2.9 mg via INTRAVENOUS
  Administered 2016-03-24: 4.9 mg via INTRAVENOUS
  Administered 2016-03-24: 1.4 mg via INTRAVENOUS
  Administered 2016-03-24: 8.6 mg via INTRAVENOUS
  Administered 2016-03-24: 12:00:00 via INTRAVENOUS
  Administered 2016-03-24: 0 mg via INTRAVENOUS
  Administered 2016-03-24: 4.2 mg via INTRAVENOUS
  Administered 2016-03-25: 2.1 mg via INTRAVENOUS
  Administered 2016-03-25 (×2): 0.7 mg via INTRAVENOUS
  Filled 2016-03-23: qty 25

## 2016-03-23 MED ORDER — HYDROMORPHONE HCL 2 MG/ML IJ SOLN
2.0000 mg | INTRAMUSCULAR | Status: DC
  Administered 2016-03-23 – 2016-03-25 (×16): 2 mg via INTRAVENOUS
  Filled 2016-03-23 (×16): qty 1

## 2016-03-23 MED ORDER — PROMETHAZINE HCL 25 MG/ML IJ SOLN
12.5000 mg | Freq: Four times a day (QID) | INTRAMUSCULAR | Status: DC | PRN
  Administered 2016-03-24 (×2): 12.5 mg via INTRAVENOUS
  Filled 2016-03-23 (×2): qty 1

## 2016-03-23 MED ORDER — PROMETHAZINE HCL 25 MG RE SUPP: 12.5000 mg | Freq: Four times a day (QID) | RECTAL | Status: DC | PRN

## 2016-03-23 MED ORDER — PROMETHAZINE HCL 25 MG PO TABS
12.5000 mg | ORAL_TABLET | Freq: Four times a day (QID) | ORAL | Status: DC | PRN
  Administered 2016-03-24 – 2016-03-25 (×2): 12.5 mg via ORAL
  Filled 2016-03-23 (×2): qty 1

## 2016-03-23 NOTE — Progress Notes (Signed)
SICKLE CELL SERVICE PROGRESS NOTE  [name] ZOX:[id number] DOB: 1975/09/28 DOA: 03/22/2016 PCP: Dorrene GermanEdwin A Avbuere, MD  Assessment/Plan: Principal Problem:   Sickle cell anemia with crisis (HCC) Active Problems:   Tobacco abuse   Leukocytosis   Sickle cell anemia (HCC)  1. Hb [place] with crisis: Continue PCA and clinician assisted doses at current dose. Continue Toradol and IVF. Encourage ICS use. 2. Emesis: Pt had several episodes of emesis today which he associates with Dilaudid use. Continue Phenergan and Zofran.  3. Leukocytosis: Pt has a mild leukocytosis without evidence of infection. Will check urine but this is likely secondary to crisis.  4. Anemia of chronic disease: Hb stable.  5. Chronic pain: Continue MS Contin  BID.   Code Status: Full Code Family Communication: N/A Disposition Plan: Not yet ready for discharge  [name] A.  Pager [phone]. If 7PM-7AM, please contact night-coverage.  03/23/2016, 9:49 AM  LOS: 1 day   Interim History: Pt reports pain down to 8-9/10 from 10/10. Pain is localized to right rib cage area. He had 3 episodes of emesis today which improved with Phenergan.   Consultants:  None  Procedures:  None  Antibiotics:  None   Objective: Vitals:   03/23/16 0017 03/23/16 0430 03/23/16 0630 03/23/16 0840  BP:   117/70   Pulse:   69   Resp: 15 11 (!) 9 11  Temp:   98.4 F (36.9 C)   TempSrc:   Oral   SpO2: 100% 97% 98% 99%  Weight:      Height:       Weight change:   Intake/Output Summary (Last 24 hours) at 03/23/16 0949 Last data filed at 03/22/16 1800  Gross per 24 hour  Intake              420 ml  Output                0 ml  Net              420 ml    General: Alert, awake, oriented x3, in no acute distress.  HEENT: [name] PEERL, EOMI, anicteric Neck: Trachea midline,  no masses, no thyromegal,y no JVD, no carotid bruit OROPHARYNX:  Moist, No exudate/ erythema/lesions.  Heart: Regular rate and rhythm, without  murmurs, rubs, gallops, PMI non-displaced, no heaves or thrills on palpation.  Lungs: Clear to auscultation, no wheezing or rhonchi noted. No increased vocal fremitus resonant to percussion  Abdomen: Soft, nontender, nondistended, positive bowel sounds, no masses no hepatosplenomegaly noted.  Neuro: No focal neurological deficits noted cranial nerves II through XII grossly intact.  Strength at functional baseline in bilateral upper and lower extremities. Musculoskeletal: No warm swelling or erythema around joints, no spinal tenderness noted. Psychiatric: Patient alert and oriented x3, good insight and cognition, good recent to remote recall.    Data Reviewed: Basic Metabolic Panel:  Recent Labs Lab 03/22/16 1010 03/23/16 0413  NA 137 138  K 3.2* 4.1  CL 104 108  CO2 25 25  GLUCOSE 89 83  BUN 12 7  CREATININE 0.84 0.70  CALCIUM 8.9 8.7*   Liver Function Tests:  Recent Labs Lab 03/22/16 1010 03/23/16 0413  AST 20 17  ALT 11* 10*  ALKPHOS 72 70  BILITOT 2.1* 2.4*  PROT 7.8 7.4  ALBUMIN 4.5 4.2   No results for input(s): LIPASE, AMYLASE in the last 168 hours. No results for input(s): AMMONIA in the last 168 hours. CBC:  Recent Labs Lab  03/22/16 1010 03/23/16 0413  WBC 11.3* 10.9*  NEUTROABS 6.7 5.7  HGB 10.7* 9.8*  HCT 29.6* 26.5*  MCV 89.7 87.5  PLT 308 241   Cardiac Enzymes: No results for input(s): CKTOTAL, CKMB, CKMBINDEX, TROPONINI in the last 168 hours. BNP (last 3 results) No results for input(s): BNP in the last 8760 hours.  ProBNP (last 3 results) No results for input(s): PROBNP in the last 8760 hours.  CBG: No results for input(s): GLUCAP in the last 168 hours.  No results found for this or any previous visit (from the past 240 hour(s)).   Studies: Dg Chest 2 View  Result Date: 03/22/2016 CLINICAL DATA:  Patient with sickle cell crisis. Right-sided chest pain. Cough and congestion. EXAM: CHEST  2 VIEW COMPARISON:  Chest radiograph  02/11/2016. FINDINGS: Monitoring leads overlie the patient. Normal cardiac and mediastinal contours. Peripheral scarring right mid lung. No large area of pulmonary consolidation. No pleural effusion or pneumothorax. No aggressive or acute appearing osseous lesions. 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 IMPRESSION: No acute cardiopulmonary process. Electronically Signed   By: Annia Beltrew  Davis M.D.   On: 03/22/2016 10:06    Scheduled Meds: . cyclobenzaprine  10 mg Oral QHS  . enoxaparin (LOVENOX) injection  40 mg Subcutaneous Q24H  . folic acid  1 mg Oral QPM  . HYDROmorphone   Intravenous Q4H  .  HYDROmorphone (DILAUDID) injection  2 mg Intravenous Q3H  . ketorolac  30 mg Intravenous Q6H  . morphine  60 mg Oral BID  . senna-docusate  1 tablet Oral BID   Continuous Infusions: . sodium chloride 150 mL/hr at 03/23/16 0300    Principal Problem:   Sickle cell anemia with crisis [facility](HCC) Active Problems:   Tobacco abuse   Leukocytosis   Sickle cell anemia (HCC)     In excess of 25 minutes spent during this visit. Greater than 50% involved face to face contact with the patient for assessment, counseling and coordination of care.

## 2016-03-24 MED ORDER — LACTULOSE 10 GM/15ML PO SOLN
30.0000 g | Freq: Once | ORAL | Status: AC
  Administered 2016-03-24: 30 g via ORAL
  Filled 2016-03-24: qty 45

## 2016-03-24 MED ORDER — LACTULOSE 10 GM/15ML PO SOLN
30.0000 g | Freq: Every day | ORAL | Status: DC | PRN
  Filled 2016-03-24: qty 45

## 2016-03-24 NOTE — Progress Notes (Signed)
SICKLE CELL SERVICE PROGRESS NOTE  Malik PortHoward E [name] ZOX:[id number] DOB: June 10, 1975 DOA: 03/22/2016 PCP: Dorrene GermanEdwin A Avbuere, MD  Assessment/Plan: Principal Problem:   Sickle cell anemia with crisis (HCC) Active Problems:   Tobacco abuse   Leukocytosis   Sickle cell anemia (HCC)  1. Hb [place] with crisis: Continue PCA and clinician assisted doses at current dose. Continue Toradol and IVF. Encourage ICS use. 2. Emesis: Improved with Phenergan and Zofran.  3. Leukocytosis: Pt has a mild leukocytosis without evidence of infection. Will check urine but this is likely secondary to crisis.  4. Constipation: Will order lactulose. 5. Anemia of chronic disease: Hb stable.  6. Chronic pain: Continue MS Contin  BID.   Code Status: Full Code Family Communication: N/A Disposition Plan: Not yet ready for discharge  [name],[name]  Pager [phone]. If 7PM-7AM, please contact night-coverage.  03/24/2016, 3:25 PM  LOS: 2 days   Interim History: Pt reports pain down to 6-7/10 from 10/10. Pain is localized to right rib cage area.He has used 22.29 mg with 36/32:demands/deliveries in addition to 16 mg of Dilaudid in clinician assisted doses.  Consultants:  None  Procedures:  None  Antibiotics:  None   Objective: Vitals:   03/24/16 0549 03/24/16 0837 03/24/16 0950 03/24/16 1156  BP: (!) 149/81  124/73   Pulse: 76  85   Resp: 10 12 16 11   Temp: 99 F (37.2 C)  98.1 F (36.7 C)   TempSrc: Oral  Oral   SpO2: 98% 90% 99% 94%  Weight:      Height:       Weight change:   Intake/Output Summary (Last 24 hours) at 03/24/16 1525 Last data filed at 03/24/16 0950  Gross per 24 hour  Intake              450 ml  Output             2850 ml  Net            -2400 ml    General: Alert, awake, oriented x3, in no acute distress.  HEENT: [place]/AT PEERL, EOMI, anicteric Neck: Trachea midline,  no masses, no thyromegal,y no JVD, no carotid bruit OROPHARYNX:  Moist, No exudate/ erythema/lesions.   Heart: Regular rate and rhythm, without murmurs, rubs, gallops, PMI non-displaced, no heaves or thrills on palpation.  Lungs: Clear to auscultation, no wheezing or rhonchi noted. No increased vocal fremitus resonant to percussion. Abdomen: Soft, nontender, nondistended, positive bowel sounds, no masses no hepatosplenomegaly noted.  Neuro: No focal neurological deficits noted cranial nerves II through XII grossly intact.  Strength at functional baseline in bilateral upper and lower extremities. Musculoskeletal: No warmth swelling or erythema around joints, no spinal tenderness noted.    Data Reviewed: Basic Metabolic Panel:  Recent Labs Lab 03/22/16 1010 03/23/16 0413  NA 137 138  K 3.2* 4.1  CL 104 108  CO2 25 25  GLUCOSE 89 83  BUN 12 7  CREATININE 0.84 0.70  CALCIUM 8.9 8.7*   Liver Function Tests:  Recent Labs Lab 03/22/16 1010 03/23/16 0413  AST 20 17  ALT 11* 10*  ALKPHOS 72 70  BILITOT 2.1* 2.4*  PROT 7.8 7.4  ALBUMIN 4.5 4.2   No results for input(s): LIPASE, AMYLASE in the last 168 hours. No results for input(s): AMMONIA in the last 168 hours. CBC:  Recent Labs Lab 03/22/16 1010 03/23/16 0413  WBC 11.3* 10.9*  NEUTROABS 6.7 5.7  HGB 10.7* 9.8*  HCT 29.6* 26.5*  MCV 89.7 87.5  PLT 308 241   Cardiac Enzymes: No results for input(s): CKTOTAL, CKMB, CKMBINDEX, TROPONINI in the last 168 hours. BNP (last 3 results) No results for input(s): BNP in the last 8760 hours.  ProBNP (last 3 results) No results for input(s): PROBNP in the last 8760 hours.  CBG: No results for input(s): GLUCAP in the last 168 hours.  No results found for this or any previous visit (from the past 240 hour(s)).   Studies: Dg Chest 2 View  Result Date: 03/22/2016 CLINICAL DATA:  Patient with sickle cell crisis. Right-sided chest pain. Cough and congestion. EXAM: CHEST  2 VIEW COMPARISON:  Chest radiograph 02/11/2016. FINDINGS: Monitoring leads overlie the patient. Normal  cardiac and mediastinal contours. Peripheral scarring right mid lung. No large area of pulmonary consolidation. No pleural effusion or pneumothorax. No aggressive or acute appearing osseous lesions. 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 IMPRESSION: No acute cardiopulmonary process. Electronically Signed   By: Annia Beltrew  Davis M.D.   On: 03/22/2016 10:06    Scheduled Meds: . cyclobenzaprine  10 mg Oral QHS  . enoxaparin (LOVENOX) injection  40 mg Subcutaneous Q24H  . folic acid  1 mg Oral QPM  . HYDROmorphone   Intravenous Q4H  .  HYDROmorphone (DILAUDID) injection  2 mg Intravenous Q3H  . ketorolac  30 mg Intravenous Q6H  . lactulose  30 g Oral Once  . morphine  60 mg Oral BID  . senna-docusate  1 tablet Oral BID   Continuous Infusions: . sodium chloride 125 mL/hr at 03/24/16 1257    Principal Problem:   Sickle cell anemia with crisis [facility](HCC) Active Problems:   Tobacco abuse   Leukocytosis   Sickle cell anemia (HCC)     In excess of 25 minutes spent during this visit. Greater than 50% involved face to face contact with the patient for assessment, counseling and coordination of care.

## 2016-03-24 NOTE — Care Management Note (Signed)
Case Management Note  Patient Details  Name: [name] MRN: [id number] Date of Birth: 1975-07-09  Subjective/Objective:  40 [facility]                  Action/Plan: From home with father. Chart reviewed and CM following for DC needs.  Expected Discharge Date:   (unknown)               Expected Discharge Plan:  Home/Self Care  In-House Referral:     Discharge planning Services  CM Consult  Post Acute Care Choice:    Choice offered to:     DME Arranged:    DME Agency:     HH Arranged:    HH Agency:     Status of Service:  In process, will continue to follow  If discussed at Long Length of Stay Meetings, dates discussed:    Additional CommentsBartholome Bill:  [name], RN 03/24/2016, 11:44 AM  [phone]

## 2016-03-25 MED ORDER — HYDROMORPHONE HCL 2 MG/ML IJ SOLN
2.0000 mg | INTRAMUSCULAR | Status: DC | PRN
  Administered 2016-03-26 (×2): 2 mg via INTRAVENOUS
  Filled 2016-03-25 (×2): qty 1

## 2016-03-25 MED ORDER — MORPHINE SULFATE 30 MG PO TABS
30.0000 mg | ORAL_TABLET | ORAL | Status: DC
  Administered 2016-03-25 – 2016-03-26 (×7): 30 mg via ORAL
  Filled 2016-03-25 (×7): qty 1

## 2016-03-25 NOTE — Progress Notes (Signed)
SICKLE CELL SERVICE PROGRESS NOTE  Malik PortHoward E [name] ZOX:[id number] DOB: [birth year] DOA: 03/22/2016 PCP: Dorrene GermanEdwin A Avbuere, MD  Assessment/Plan: Principal Problem:   Sickle cell anemia with crisis (HCC) Active Problems:   Tobacco abuse   Leukocytosis   Sickle cell anemia (HCC)  1. Hb [place] with crisis: Will schedule immediate release Morphine. Discontinue PCA and continue clinician assisted doses on an AS NEEDED basis. Continue Toradol and IVF. Encourage ICS use. 2. Emesis: Improved with Phenergan and Zofran.  3. Leukocytosis: Pt has a mild leukocytosis without evidence of infection. Will check urine but this is likely secondary to crisis.  4. Constipation: Will order lactulose. 5. Anemia of chronic disease: Hb stable. Re-check Hb tomorrow. 6. Chronic pain: Continue MS Contin  BID.   Code Status: Full Code Family Communication: N/A Disposition Plan: Not yet ready for discharge  Mckinsey Keagle A.  Pager [phone]. If 7PM-7AM, please contact night-coverage.  03/25/2016, 11:37 AM  LOS: 3 days   Interim History: Pt reports pain down to 5-6/10 from 10/10. Pain is localized to right rib cage area. Pt still having nausea bt tolerating diet well.   Consultants:  None  Procedures:  None  Antibiotics:  None   Objective: Vitals:   03/25/16 0525 03/25/16 0638 03/25/16 0730 03/25/16 0935  BP: 133/84   126/78  Pulse: 93   70  Resp: 15  16   Temp: 100.1 F (37.8 C)   98.5 F (36.9 C)  TempSrc: Oral   Oral  SpO2: 92%  95% 98%  Weight:  81.3 kg (179 lb 3.2 oz)    Height:       Weight change:   Intake/Output Summary (Last 24 hours) at 03/25/16 1137 Last data filed at 03/25/16 [id number]  Gross per 24 hour  Intake              186 ml  Output             2250 ml  Net            -2064 ml    General: Alert, awake, oriented x3, in no acute distress.  HEENT: [place]/AT PEERL, EOMI, anicteric Heart: Regular rate and rhythm, without murmurs, rubs, gallops, PMI non-displaced, no heaves or  thrills on palpation.  Lungs: Clear to auscultation, no wheezing or rhonchi noted. No increased vocal fremitus resonant to percussion. Abdomen: Soft, nontender, nondistended, positive bowel sounds, no masses no hepatosplenomegaly noted.  Neuro: No focal neurological deficits noted cranial nerves II through XII grossly intact.  Strength at functional baseline in bilateral upper and lower extremities. Musculoskeletal: No warmth swelling or erythema around joints, no spinal tenderness noted.    Data Reviewed: Basic Metabolic Panel:  Recent Labs Lab 03/22/16 1010 03/23/16 0413  NA 137 138  K 3.2* 4.1  CL 104 108  CO2 25 25  GLUCOSE 89 83  BUN 12 7  CREATININE 0.84 0.70  CALCIUM 8.9 8.7*   Liver Function Tests:  Recent Labs Lab 03/22/16 1010 03/23/16 0413  AST 20 17  ALT 11* 10*  ALKPHOS 72 70  BILITOT 2.1* 2.4*  PROT 7.8 7.4  ALBUMIN 4.5 4.2   No results for input(s): LIPASE, AMYLASE in the last 168 hours. No results for input(s): AMMONIA in the last 168 hours. CBC:  Recent Labs Lab 03/22/16 1010 03/23/16 0413  WBC 11.3* 10.9*  NEUTROABS 6.7 5.7  HGB 10.7* 9.8*  HCT 29.6* 26.5*  MCV 89.7 87.5  PLT 308 241   Cardiac Enzymes: No results  for input(s): CKTOTAL, CKMB, CKMBINDEX, TROPONINI in the last 168 hours. BNP (last 3 results) No results for input(s): BNP in the last 8760 hours.  ProBNP (last 3 results) No results for input(s): PROBNP in the last 8760 hours.  CBG: No results for input(s): GLUCAP in the last 168 hours.  No results found for this or any previous visit (from the past 240 hour(s)).   Studies: Dg Chest 2 View  Result Date: 03/22/2016 CLINICAL DATA:  Patient with sickle cell crisis. Right-sided chest pain. Cough and congestion. EXAM: CHEST  2 VIEW COMPARISON:  Chest radiograph 02/11/2016. FINDINGS: Monitoring leads overlie the patient. Normal cardiac and mediastinal contours. Peripheral scarring right mid lung. No large area of pulmonary  consolidation. No pleural effusion or pneumothorax. No aggressive or acute appearing osseous lesions. 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 IMPRESSION: No acute cardiopulmonary process. Electronically Signed   By: Annia Beltrew  Davis M.D.   On: 03/22/2016 10:06    Scheduled Meds: . cyclobenzaprine  10 mg Oral QHS  . enoxaparin (LOVENOX) injection  40 mg Subcutaneous Q24H  . folic acid  1 mg Oral QPM  . ketorolac  30 mg Intravenous Q6H  . morphine  60 mg Oral BID  . morphine  30 mg Oral Q4H  . senna-docusate  1 tablet Oral BID   Continuous Infusions: . sodium chloride 50 mL/hr at 03/25/16 0100    Principal Problem:   Sickle cell anemia with crisis [facility](HCC) Active Problems:   Tobacco abuse   Leukocytosis   Sickle cell anemia (HCC)     In excess of 25 minutes spent during this visit. Greater than 50% involved face to face contact with the patient for assessment, counseling and coordination of care.

## 2016-03-25 NOTE — Progress Notes (Signed)
Morphine 30 mg po given. Dilaudid PCA d/c'd per MD order. Will continue to monitor.

## 2016-03-26 DIAGNOSIS — Z720 Tobacco use: Secondary | ICD-10-CM

## 2016-03-26 LAB — RETICULOCYTES
RBC.: 2.55 MIL/uL — AB (ref 4.22–5.81)
RETIC COUNT ABSOLUTE: 150.5 10*3/uL (ref 19.0–186.0)
Retic Ct Pct: 5.9 % — ABNORMAL HIGH (ref 0.4–3.1)

## 2016-03-26 LAB — CBC WITH DIFFERENTIAL/PLATELET
BASOS ABS: 0 10*3/uL (ref 0.0–0.1)
BASOS PCT: 0 %
Eosinophils Absolute: 0.9 10*3/uL — ABNORMAL HIGH (ref 0.0–0.7)
Eosinophils Relative: 6 %
HEMATOCRIT: 22.6 % — AB (ref 39.0–52.0)
HEMOGLOBIN: 8.3 g/dL — AB (ref 13.0–17.0)
LYMPHS PCT: 14 %
Lymphs Abs: 2.2 10*3/uL (ref 0.7–4.0)
MCH: 32.5 pg (ref 26.0–34.0)
MCHC: 36.7 g/dL — AB (ref 30.0–36.0)
MCV: 88.6 fL (ref 78.0–100.0)
MONOS PCT: 8 %
Monocytes Absolute: 1.3 10*3/uL — ABNORMAL HIGH (ref 0.1–1.0)
NEUTROS ABS: 11.3 10*3/uL — AB (ref 1.7–7.7)
NEUTROS PCT: 72 %
Platelets: 212 10*3/uL (ref 150–400)
RBC: 2.55 MIL/uL — ABNORMAL LOW (ref 4.22–5.81)
RDW: 15.6 % — ABNORMAL HIGH (ref 11.5–15.5)
WBC: 15.7 10*3/uL — ABNORMAL HIGH (ref 4.0–10.5)

## 2016-03-26 NOTE — Progress Notes (Signed)
Patient's d/c instructions rendered, verbalized understanding.

## 2016-03-26 NOTE — Discharge Summary (Signed)
[name] MRN: [id number] DOB/AGE: [birth year] 40 y.o.  Admit date: 03/22/2016 Discharge date: 03/26/2016  Primary Care Physician:  Dorrene GermanEdwin A Avbuere, MD   Discharge Diagnoses:   Patient Active Problem List   Diagnosis Date Noted  . Sickle cell anemia (HCC) 08/13/2015  . Sickle cell disease, type [place] (HCC) 10/18/2012  . History of tobacco abuse 10/13/2012  . Anemia 11/08/2011  . Avascular necrosis of hip, left (HCC) 08/27/2011  . Tobacco abuse 05/15/2011    DISCHARGE MEDICATION:   Medication List    TAKE these medications   cyclobenzaprine 10 MG tablet Commonly known as:  FLEXERIL Take 10 mg by mouth at bedtime.   folic acid 1 MG tablet Commonly known as:  FOLVITE Take 1 mg by mouth every evening.   HYDROmorphone 2 MG tablet Commonly known as:  DILAUDID Take 1 tablet (2 mg total) by mouth every 6 (six) hours as needed for severe pain. Resume use of Morphine Sulfate after completion of this medication   ibuprofen 800 MG tablet Commonly known as:  ADVIL,MOTRIN Take 800 mg by mouth 3 (three) times daily as needed (PAIN).   morphine 60 MG 12 hr tablet Commonly known as:  MS CONTIN Take 1 tablet (60 mg total) by mouth 2 (two) times daily.   morphine 30 MG tablet Commonly known as:  MSIR Take 1 tablet (30 mg total) by mouth daily as needed for severe pain (pain). Resume after completing dilaudid.   promethazine 25 MG tablet Commonly known as:  PHENERGAN Take 25 mg by mouth every 6 (six) hours as needed for nausea (nausea).         Consults:    SIGNIFICANT DIAGNOSTIC STUDIES:  Dg Chest 2 View  Result Date: 03/22/2016 CLINICAL DATA:  Patient with sickle cell crisis. Right-sided chest pain. Cough and congestion. EXAM: CHEST  2 VIEW COMPARISON:  Chest radiograph 02/11/2016. FINDINGS: Monitoring leads overlie the patient. Normal cardiac and mediastinal contours. Peripheral scarring right mid lung. No large area of pulmonary consolidation. No pleural effusion or  pneumothorax. No aggressive or acute appearing osseous lesions. 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 IMPRESSION: No acute cardiopulmonary process. Electronically Signed   By: Annia Beltrew  Davis M.D.   On: 03/22/2016 10:06      No results found for this or any previous visit (from the past 240 hour(s)).  BRIEF ADMITTING H & P: 40 year old gentleman with history of sickle cell disease presenting with sickle cell painful crisis.he has pain of 10 out of 10 in his back and legs. He has taken his home medications without relief. He was seen in the emergency room and given multiple doses of Dilaudid but no relief.patient is therefore being admitted to the hospital for further treatment. Denies any fever or chills no nausea vomiting or diarrhea.  Hospital Course:  Present on Admission: . (Resolved) Leukocytosis . (Resolved) Sickle cell anemia with crisis (HCC) . Tobacco abuse . Sickle cell anemia (HCC) [name] is well known to me from previous admissions and was admitted with crisis. Hospital course was uncomplicated and he was treated with Dilaudid PCA and Toradol. He initially had some emesis and was treated with antiemetics. This resolved and he was able to tolerate his meals without difficulty. As his pain resolved, he was transitioned to oral analgesics. At the time of discharge his pain is  down to 3/10 from 8/10 on admission. He is ambulatory without difficulty.   Disposition and Follow-up: He is to follow up with his PMD and obtain  [facility]. He has also been given information on the HOPE study for which [facility] is a study center.    Discharge Instructions    Activity as tolerated - No restrictions    Complete by:  As directed    Diet general    Complete by:  As directed       DISCHARGE EXAM:  General: Alert, awake, oriented x3, in no apparent distress.  HEENT: [place]/AT PEERL, EOMI, anicteric. Neck: Trachea midline, no  masses, no thyromegal,y no JVD, no carotid bruit OROPHARYNX: Moist, No exudate/ erythema/lesions.  Heart: Regular rate and rhythm, without murmurs, rubs, gallops or S3. PMI non-displaced. Exam reveals no decreased pulses. Pulmonary/Chest: Normal effort. Breath sounds normal. No. Apnea. Clear to auscultation,no stridor,  no wheezing and no rhonchi noted. No respiratory distress and no tenderness noted. Abdomen: Soft, nontender, nondistended, normal bowel sounds, no masses no hepatosplenomegaly noted. No fluid wave and no ascites. There is no guarding or rebound. Neuro: Alert and oriented to person, place and time. Normal motor skills, Displays no atrophy or tremors and exhibits normal muscle tone.  No focal neurological deficits noted cranial nerves II through XII grossly intact. No sensory deficit noted.  Strength at baseline in bilateral upper and lower extremities. Gait normal. Musculoskeletal: No warm swelling or erythema around joints, no spinal tenderness noted. Psychiatric: Patient alert and oriented x3, good insight and cognition, good recent to remote recall. Mood, affect and judgement normal Lymph node survey: No cervical axillary or inguinal lymphadenopathy noted. Skin: Skin is warm and dry. No bruising, no ecchymosis and no rash noted. Pt is not diaphoretic. No erythema. No pallor    Blood pressure 124/74, pulse 77, temperature 98.7 F (37.1 C), temperature source Oral, resp. rate 16, height 6\' 2"  (1.88 m), weight 81.3 kg (179 lb 3.2 oz), SpO2 92 %.  No results for input(s): NA, K, CL, CO2, GLUCOSE, BUN, CREATININE, CALCIUM, MG, PHOS in the last 72 hours. No results for input(s): AST, ALT, ALKPHOS, BILITOT, PROT, ALBUMIN in the last 72 hours. No results for input(s): LIPASE, AMYLASE in the last 72 hours.  Recent Labs  03/26/16 1008  WBC 15.7*  NEUTROABS 11.3*  HGB 8.3*  HCT 22.6*  MCV 88.6  PLT 212     Total time spent including face to face and decision making was greater  than 30 minutes  Signed: [name],[name] 03/26/2016, 2:25 PM

## 2016-03-26 NOTE — Progress Notes (Signed)
Patient d/c home. Stable. 

## 2016-04-21 ENCOUNTER — Inpatient Hospital Stay (HOSPITAL_COMMUNITY): Payer: Medicare Other

## 2016-04-21 ENCOUNTER — Emergency Department (HOSPITAL_COMMUNITY): Payer: Medicare Other

## 2016-04-21 ENCOUNTER — Encounter (HOSPITAL_COMMUNITY): Payer: Self-pay | Admitting: Emergency Medicine

## 2016-04-21 ENCOUNTER — Inpatient Hospital Stay (HOSPITAL_COMMUNITY)
Admission: EM | Admit: 2016-04-21 | Discharge: 2016-04-24 | DRG: 193 | Disposition: A | Discharge status: Home / Self Care | Payer: Medicare Other | Attending: Internal Medicine | Admitting: Internal Medicine

## 2016-04-21 DIAGNOSIS — R7989 Other specified abnormal findings of blood chemistry: Secondary | ICD-10-CM

## 2016-04-21 DIAGNOSIS — R079 Chest pain, unspecified: Secondary | ICD-10-CM | POA: Diagnosis present

## 2016-04-21 DIAGNOSIS — D638 Anemia in other chronic diseases classified elsewhere: Secondary | ICD-10-CM | POA: Diagnosis present

## 2016-04-21 DIAGNOSIS — R0781 Pleurodynia: Secondary | ICD-10-CM

## 2016-04-21 DIAGNOSIS — R0602 Shortness of breath: Secondary | ICD-10-CM | POA: Diagnosis not present

## 2016-04-21 DIAGNOSIS — Z96641 Presence of right artificial hip joint: Secondary | ICD-10-CM | POA: Diagnosis present

## 2016-04-21 DIAGNOSIS — Y95 Nosocomial condition: Secondary | ICD-10-CM | POA: Diagnosis present

## 2016-04-21 DIAGNOSIS — F17210 Nicotine dependence, cigarettes, uncomplicated: Secondary | ICD-10-CM | POA: Diagnosis present

## 2016-04-21 DIAGNOSIS — D5700 Hb-SS disease with crisis, unspecified: Secondary | ICD-10-CM | POA: Diagnosis not present

## 2016-04-21 DIAGNOSIS — F17213 Nicotine dependence, cigarettes, with withdrawal: Secondary | ICD-10-CM | POA: Diagnosis not present

## 2016-04-21 DIAGNOSIS — J189 Pneumonia, unspecified organism: Principal | ICD-10-CM | POA: Diagnosis present

## 2016-04-21 LAB — COMPREHENSIVE METABOLIC PANEL
ALT: 10 U/L — ABNORMAL LOW (ref 17–63)
ANION GAP: 4 — AB (ref 5–15)
AST: 30 U/L (ref 15–41)
Albumin: 5.1 g/dL — ABNORMAL HIGH (ref 3.5–5.0)
Alkaline Phosphatase: 75 U/L (ref 38–126)
BUN: 13 mg/dL (ref 6–20)
CALCIUM: 9.2 mg/dL (ref 8.9–10.3)
CO2: 26 mmol/L (ref 22–32)
Chloride: 105 mmol/L (ref 101–111)
Creatinine, Ser: 0.84 mg/dL (ref 0.61–1.24)
Glucose, Bld: 96 mg/dL (ref 65–99)
Potassium: 4.6 mmol/L (ref 3.5–5.1)
SODIUM: 135 mmol/L (ref 135–145)
Total Bilirubin: 2.6 mg/dL — ABNORMAL HIGH (ref 0.3–1.2)
Total Protein: 8.7 g/dL — ABNORMAL HIGH (ref 6.5–8.1)

## 2016-04-21 LAB — CBC WITH DIFFERENTIAL/PLATELET
BASOS ABS: 0 10*3/uL (ref 0.0–0.1)
BASOS PCT: 0 %
EOS ABS: 0.6 10*3/uL (ref 0.0–0.7)
Eosinophils Relative: 4 %
HCT: 29.4 % — ABNORMAL LOW (ref 39.0–52.0)
Hemoglobin: 11 g/dL — ABNORMAL LOW (ref 13.0–17.0)
LYMPHS PCT: 18 %
Lymphs Abs: 2.5 10*3/uL (ref 0.7–4.0)
MCH: 32.6 pg (ref 26.0–34.0)
MCHC: 37.4 g/dL — ABNORMAL HIGH (ref 30.0–36.0)
MCV: 87.2 fL (ref 78.0–100.0)
MONO ABS: 1 10*3/uL (ref 0.1–1.0)
Monocytes Relative: 7 %
NEUTROS PCT: 71 %
Neutro Abs: 10 10*3/uL — ABNORMAL HIGH (ref 1.7–7.7)
PLATELETS: 308 10*3/uL (ref 150–400)
RBC: 3.37 MIL/uL — AB (ref 4.22–5.81)
RDW: 14.8 % (ref 11.5–15.5)
WBC: 14.1 10*3/uL — AB (ref 4.0–10.5)

## 2016-04-21 LAB — D-DIMER, QUANTITATIVE (NOT AT ARMC): D DIMER QUANT: 2.13 ug{FEU}/mL — AB (ref 0.00–0.50)

## 2016-04-21 LAB — I-STAT TROPONIN, ED: Troponin i, poc: 0 ng/mL (ref 0.00–0.08)

## 2016-04-21 LAB — PROCALCITONIN

## 2016-04-21 LAB — RETICULOCYTES
RBC.: 3.37 MIL/uL — AB (ref 4.22–5.81)
RETIC COUNT ABSOLUTE: 43.8 10*3/uL (ref 19.0–186.0)
RETIC CT PCT: 1.3 % (ref 0.4–3.1)

## 2016-04-21 LAB — TROPONIN I

## 2016-04-21 MED ORDER — POLYETHYLENE GLYCOL 3350 17 G PO PACK
17.0000 g | PACK | Freq: Every day | ORAL | Status: DC
  Administered 2016-04-21 – 2016-04-24 (×4): 17 g via ORAL
  Filled 2016-04-21 (×4): qty 1

## 2016-04-21 MED ORDER — IOPAMIDOL (ISOVUE-370) INJECTION 76%
INTRAVENOUS | Status: AC
  Filled 2016-04-21: qty 100

## 2016-04-21 MED ORDER — HYDROMORPHONE HCL 2 MG/ML IJ SOLN
1.0000 mg | Freq: Once | INTRAMUSCULAR | Status: AC
  Administered 2016-04-21: 1 mg via INTRAVENOUS
  Filled 2016-04-21: qty 1

## 2016-04-21 MED ORDER — SODIUM CHLORIDE 0.9% FLUSH: 9.0000 mL | INTRAVENOUS | Status: DC | PRN

## 2016-04-21 MED ORDER — SODIUM CHLORIDE 0.9 % IV BOLUS (SEPSIS): 1000.0000 mL | Freq: Once | INTRAVENOUS | Status: DC

## 2016-04-21 MED ORDER — SODIUM CHLORIDE 0.9 % IV SOLN
25.0000 mg | INTRAVENOUS | Status: DC | PRN
  Filled 2016-04-21: qty 0.5

## 2016-04-21 MED ORDER — VANCOMYCIN HCL IN DEXTROSE 1-5 GM/200ML-% IV SOLN
1000.0000 mg | Freq: Once | INTRAVENOUS | Status: AC
  Administered 2016-04-21: 1000 mg via INTRAVENOUS
  Filled 2016-04-21: qty 200

## 2016-04-21 MED ORDER — ASPIRIN EC 81 MG PO TBEC
81.0000 mg | DELAYED_RELEASE_TABLET | Freq: Every day | ORAL | Status: DC
  Administered 2016-04-22 – 2016-04-24 (×3): 81 mg via ORAL
  Filled 2016-04-21 (×4): qty 1

## 2016-04-21 MED ORDER — NICOTINE 7 MG/24HR TD PT24
7.0000 mg | MEDICATED_PATCH | Freq: Every day | TRANSDERMAL | Status: DC
  Filled 2016-04-21 (×2): qty 1

## 2016-04-21 MED ORDER — DEXTROSE 5 % IV SOLN
1.0000 g | Freq: Three times a day (TID) | INTRAVENOUS | Status: DC
  Administered 2016-04-21 – 2016-04-23 (×5): 1 g via INTRAVENOUS
  Filled 2016-04-21 (×6): qty 1

## 2016-04-21 MED ORDER — FOLIC ACID 1 MG PO TABS
1.0000 mg | ORAL_TABLET | Freq: Every evening | ORAL | Status: DC
  Administered 2016-04-22 – 2016-04-23 (×2): 1 mg via ORAL
  Filled 2016-04-21 (×2): qty 1

## 2016-04-21 MED ORDER — HYDROMORPHONE HCL 2 MG/ML IJ SOLN
0.5000 mg | Freq: Once | INTRAMUSCULAR | Status: AC
  Administered 2016-04-21: 0.5 mg via SUBCUTANEOUS
  Filled 2016-04-21: qty 1

## 2016-04-21 MED ORDER — DIPHENHYDRAMINE HCL 25 MG PO CAPS: 25.0000 mg | ORAL_CAPSULE | ORAL | Status: DC | PRN

## 2016-04-21 MED ORDER — HYDROMORPHONE 1 MG/ML IV SOLN
INTRAVENOUS | Status: DC
  Administered 2016-04-21: 25 mg via INTRAVENOUS
  Administered 2016-04-22: 2.8 mg via INTRAVENOUS
  Administered 2016-04-22: 25 mg via INTRAVENOUS
  Administered 2016-04-22: 7 mg via INTRAVENOUS
  Administered 2016-04-22: 5.99 mg via INTRAVENOUS
  Filled 2016-04-21: qty 25

## 2016-04-21 MED ORDER — NALOXONE HCL 0.4 MG/ML IJ SOLN: 0.4000 mg | INTRAMUSCULAR | Status: DC | PRN

## 2016-04-21 MED ORDER — ENOXAPARIN SODIUM 40 MG/0.4ML ~~LOC~~ SOLN
40.0000 mg | SUBCUTANEOUS | Status: DC
  Administered 2016-04-22 – 2016-04-23 (×2): 40 mg via SUBCUTANEOUS
  Filled 2016-04-21 (×2): qty 0.4

## 2016-04-21 MED ORDER — ASPIRIN 81 MG PO CHEW
324.0000 mg | CHEWABLE_TABLET | Freq: Once | ORAL | Status: AC
  Administered 2016-04-21: 324 mg via ORAL
  Filled 2016-04-21: qty 4

## 2016-04-21 MED ORDER — VANCOMYCIN HCL IN DEXTROSE 1-5 GM/200ML-% IV SOLN
1000.0000 mg | Freq: Three times a day (TID) | INTRAVENOUS | Status: DC
  Administered 2016-04-21 – 2016-04-22 (×2): 1000 mg via INTRAVENOUS
  Filled 2016-04-21 (×3): qty 200

## 2016-04-21 MED ORDER — PANTOPRAZOLE SODIUM 40 MG PO TBEC
40.0000 mg | DELAYED_RELEASE_TABLET | Freq: Every day | ORAL | Status: DC
  Administered 2016-04-21 – 2016-04-24 (×4): 40 mg via ORAL
  Filled 2016-04-21 (×4): qty 1

## 2016-04-21 MED ORDER — IOPAMIDOL (ISOVUE-370) INJECTION 76%
100.0000 mL | Freq: Once | INTRAVENOUS | Status: AC | PRN
  Administered 2016-04-21: 100 mL via INTRAVENOUS

## 2016-04-21 MED ORDER — SENNOSIDES-DOCUSATE SODIUM 8.6-50 MG PO TABS
1.0000 | ORAL_TABLET | Freq: Two times a day (BID) | ORAL | Status: DC
  Administered 2016-04-21 – 2016-04-24 (×6): 1 via ORAL
  Filled 2016-04-21 (×6): qty 1

## 2016-04-21 MED ORDER — MORPHINE SULFATE ER 15 MG PO TBCR
60.0000 mg | EXTENDED_RELEASE_TABLET | Freq: Two times a day (BID) | ORAL | Status: DC
  Administered 2016-04-21 – 2016-04-24 (×6): 60 mg via ORAL
  Filled 2016-04-21 (×6): qty 4

## 2016-04-21 MED ORDER — KETOROLAC TROMETHAMINE 30 MG/ML IJ SOLN
30.0000 mg | Freq: Three times a day (TID) | INTRAMUSCULAR | Status: DC
  Administered 2016-04-21 – 2016-04-24 (×8): 30 mg via INTRAVENOUS
  Filled 2016-04-21 (×8): qty 1

## 2016-04-21 MED ORDER — CYCLOBENZAPRINE HCL 10 MG PO TABS
10.0000 mg | ORAL_TABLET | Freq: Every day | ORAL | Status: DC
  Administered 2016-04-21 – 2016-04-23 (×3): 10 mg via ORAL
  Filled 2016-04-21 (×3): qty 1

## 2016-04-21 MED ORDER — KETOROLAC TROMETHAMINE 30 MG/ML IJ SOLN
30.0000 mg | Freq: Once | INTRAMUSCULAR | Status: AC
  Administered 2016-04-21: 30 mg via INTRAVENOUS
  Filled 2016-04-21: qty 1

## 2016-04-21 MED ORDER — DEXTROSE 5 % IV SOLN
2.0000 g | Freq: Once | INTRAVENOUS | Status: AC
  Administered 2016-04-21: 2 g via INTRAVENOUS
  Filled 2016-04-21: qty 2

## 2016-04-21 MED ORDER — PROMETHAZINE HCL 25 MG PO TABS
25.0000 mg | ORAL_TABLET | Freq: Four times a day (QID) | ORAL | Status: DC | PRN
  Administered 2016-04-23 (×2): 25 mg via ORAL
  Filled 2016-04-21 (×2): qty 1

## 2016-04-21 MED ORDER — ONDANSETRON HCL 4 MG/2ML IJ SOLN
4.0000 mg | Freq: Four times a day (QID) | INTRAMUSCULAR | Status: DC | PRN
  Administered 2016-04-21 – 2016-04-23 (×5): 4 mg via INTRAVENOUS
  Filled 2016-04-21 (×5): qty 2

## 2016-04-21 MED ORDER — DEXTROSE-NACL 5-0.45 % IV SOLN
INTRAVENOUS | Status: DC
  Administered 2016-04-21 – 2016-04-24 (×6): via INTRAVENOUS

## 2016-04-21 NOTE — ED Provider Notes (Signed)
[facility]-EMERGENCY DEPT Provider Note   CSN: [id number] Arrival date & time: 04/21/16  1121     History   Chief Complaint Chief Complaint  Patient presents with  . Sickle Cell Pain Crisis  . Chest Pain    HPI [name] is a 40 y.o. male with history of sickle cell disease and crisis avascular necrosis of hip presents with pain to chest and back and shortness of breath times to 3 days. Patient states that symptoms are similar but not exactly the same as his typical sickle cell crisis. Patient has 5 visits in the last 6 months for sickle cell crisis. Patient states that the pain is on the right side of his lower rib cage extending to his back. Patient endorses increased pain on deep inspiration. Nothing makes symptoms better or worse. Patient denies fevers, chills, abdominal pain, pain on his extremities, changes in bowel movements, changes in urinary symptoms, nausea, vomiting.   The history is provided by the patient.  Sickle Cell Pain Crisis  Associated symptoms: chest pain and shortness of breath (On deep inspiration)   Associated symptoms: no fever, no nausea, no sore throat, no vomiting and no wheezing   Chest Pain   Associated symptoms include shortness of breath (On deep inspiration). Pertinent negatives include no abdominal pain, no back pain (Right sided mid back pain.), no fever, no nausea, no numbness and no vomiting.    Past Medical History:  Diagnosis Date  . Avascular necrosis of hip (HCC)    bilateral  . Avascular necrosis of hip, left (HCC) 08/27/2011  . Blood transfusion   . Infection of bone, shoulder region (HCC)    left shoulder  . Pneumonia   . Sickle cell crisis [facility])     Patient Active Problem List   Diagnosis Date Noted  . Sickle cell anemia (HCC) 08/13/2015  . Sickle cell disease, type  (HCC) 10/18/2012  . History of tobacco abuse 10/13/2012  . Anemia 11/08/2011  . Avascular necrosis of hip, left (HCC) 08/27/2011  . Tobacco abuse 05/15/2011     Past Surgical History:  Procedure Laterality Date  . BONE GRAFT HIP ILIAC CREST    . JOINT REPLACEMENT  2006   right total hip arthroplasty  . Orif right hip fracture  1995       Home Medications    Prior to Admission medications   Medication Sig Start Date End Date Taking? Authorizing Provider  cyclobenzaprine (FLEXERIL) 10 MG tablet Take 10 mg by mouth at bedtime.   Yes Historical Provider, MD  folic acid (FOLVITE) 1 MG tablet Take 1 mg by mouth every evening.    Yes Historical Provider, MD  morphine (MS CONTIN) 60 MG 12 hr tablet Take 1 tablet (60 mg total) by mouth 2 (two) times daily. 11/20/11  Yes Grayce Sessions, NP  morphine (MSIR) 30 MG tablet Take 1 tablet (30 mg total) by mouth daily as needed for severe pain (pain). Resume after completing dilaudid. 02/17/16  Yes Altha Harm, MD  promethazine (PHENERGAN) 25 MG tablet Take 25 mg by mouth every 6 (six) hours as needed for nausea (nausea).    Yes Historical Provider, MD  HYDROmorphone (DILAUDID) 2 MG tablet Take 1 tablet (2 mg total) by mouth every 6 (six) hours as needed for severe pain. Resume use of Morphine Sulfate after completion of this medication Patient not taking: Reported on 04/21/2016 02/17/16   Altha Harm, MD    Family History Family History  Problem  Relation Age of Onset  . Adopted: Yes    Social History Social History  Substance Use Topics  . Smoking status: Current Some Day Smoker    Packs/day: 0.50    Types: Cigarettes    Last attempt to quit: 01/27/2012  . Smokeless tobacco: Never Used  . Alcohol use No     Allergies   Patient has no known allergies.   Review of Systems Review of Systems  Constitutional: Negative for chills and fever.  HENT: Negative for sore throat and trouble swallowing.   Respiratory: Positive for shortness of breath (On deep inspiration). Negative for wheezing.   Cardiovascular: Positive for chest pain.  Gastrointestinal: Negative for abdominal  pain, nausea and vomiting.  Genitourinary: Negative for difficulty urinating and dysuria.  Musculoskeletal: Negative for back pain (Right sided mid back pain.).  Skin: Negative for rash and wound.  Neurological: Negative for numbness.  Psychiatric/Behavioral: Negative for agitation.     Physical Exam Updated Vital Signs BP 127/76   Pulse 76   Temp 98.2 F (36.8 C) (Oral)   Resp (!) 32   Ht 6\' 2"  (1.88 m)   Wt 79.4 kg   SpO2 93%   BMI 22.47 kg/m   Physical Exam  Constitutional: He is oriented to person, place, and time. He appears well-developed and well-nourished.  Uncomfortable.  HENT:  Head: Normocephalic and atraumatic.  Nose: Nose normal.  Eyes: Conjunctivae and EOM are normal. Pupils are equal, round, and reactive to light.  Neck: Normal range of motion. Neck supple.  Cardiovascular: Normal rate and normal heart sounds.   Pulmonary/Chest: Effort normal and breath sounds normal. No respiratory distress. He exhibits tenderness.  Abdominal: Soft. Bowel sounds are normal. There is no tenderness. There is no rebound and no guarding.  Musculoskeletal: Normal range of motion.  Neurological: He is alert and oriented to person, place, and time.  Skin: Skin is warm. Capillary refill takes less than 2 seconds. No rash noted. No erythema.  Psychiatric: He has a normal mood and affect. His behavior is normal.  Nursing note and vitals reviewed.    ED Treatments / Results  Labs (all labs ordered are listed, but only abnormal results are displayed) Labs Reviewed  COMPREHENSIVE METABOLIC PANEL - Abnormal; Notable for the following:       Result Value   Total Protein 8.7 (*)    Albumin 5.1 (*)    ALT 10 (*)    Total Bilirubin 2.6 (*)    Anion gap 4 (*)    All other components within normal limits  CBC WITH DIFFERENTIAL/PLATELET - Abnormal; Notable for the following:    WBC 14.1 (*)    RBC 3.37 (*)    Hemoglobin 11.0 (*)    HCT 29.4 (*)    MCHC 37.4 (*)    Neutro Abs 10.0  (*)    All other components within normal limits  RETICULOCYTES - Abnormal; Notable for the following:    RBC. 3.37 (*)    All other components within normal limits    EKG  EKG Interpretation None       Radiology Dg Chest 2 View  Result Date: 04/21/2016 CLINICAL DATA:  Shortness of breath.  Sickle cell disease. EXAM: CHEST  2 VIEW COMPARISON:  03/22/2016 FINDINGS: The heart size and mediastinal contours are within normal limits. Right lung scarring again demonstrated. New opacity seen in the medial right middle lobe, suspicious for pneumonia. No evidence pleural effusion. Left lung is clear. IMPRESSION: New opacity in  medial right lower lobe, suspicious for pneumonia. Recommend clinical correlation; consider followup PA and lateral chest X-ray in 3-4 weeks following trial of antibiotic therapy to ensure resolution. Electronically Signed   By: Myles RosenthalJohn  Stahl M.D.   On: 04/21/2016 12:14    Procedures Procedures (including critical care time)  Medications Ordered in ED Medications  dextrose 5 %-0.45 % sodium chloride infusion ( Intravenous New Bag/Given 04/21/16 1442)  vancomycin (VANCOCIN) IVPB 1000 mg/200 mL premix (1,000 mg Intravenous New Bag/Given 04/21/16 1651)  HYDROmorphone (DILAUDID) injection 0.5 mg (0.5 mg Subcutaneous Given 04/21/16 1209)  ketorolac (TORADOL) 30 MG/ML injection 30 mg (30 mg Intravenous Given 04/21/16 1442)  HYDROmorphone (DILAUDID) injection 1 mg (1 mg Intravenous Given 04/21/16 1544)  ceFEPIme (MAXIPIME) 2 g in dextrose 5 % 50 mL IVPB (0 g Intravenous Stopped 04/21/16 1651)     Initial Impression / Assessment and Plan / ED Course  I have reviewed the triage vital signs and the nursing notes.  Pertinent labs & imaging results that were available during my care of the patient were reviewed by me and considered in my medical decision making (see chart for details).  Clinical Course   Patient is a 40 year old male with history of sickle cell disease and  recent admission for sickle cell crisis. On exam patient is afebrile, hemodynamically stable. Patient appears uncomfortable. Patient exhibits slight chest tenderness to right lower rib extending to right back. Heart and lung sounds clear. Abdomen soft and nontender. Lab work similar to previous findings. White blood cell count slightly elevated. Chest x-ray does show new opacity in medial right lower lobe, suspicious for pneumonia. Pain will be treated here in ED. Antibiotic therapy for healthcare associated pneumonia started.  Patient also seen and evaluated by Dr. Donnald GarrePfeiffer.  I spoke with inpatient physician who agreed to admit patient for further evaluation.  Final Clinical Impressions(s) / ED Diagnoses   Final diagnoses:  None    New Prescriptions New Prescriptions   No medications on file     [address]Franc[address] Manuel YorkEsp[place], [place] 04/21/16 1652    Arby BarretteMarcy Pfeiffer, MD 04/30/16 1654

## 2016-04-21 NOTE — ED Notes (Signed)
Provided ice chips with permission from provider.  

## 2016-04-21 NOTE — ED Triage Notes (Signed)
Pt with hx sickle cell c/o pain to chest and back, SOB x 2-3 days. Symptoms feel similar but not same as typically sickle cell crisis.

## 2016-04-21 NOTE — ED Notes (Signed)
Hospitalist at bedside 

## 2016-04-21 NOTE — ED Notes (Signed)
Patient wants labs collected when they start his IV

## 2016-04-21 NOTE — Progress Notes (Signed)
Pharmacy Antibiotic Note  [name] is a 40 y.o. male admitted on 04/21/2016 with pneumonia.  Pharmacy has been consulted for vancomycin and cefepime dosing.  Plan:  Cefepime 2gm x1 in ED then cefepime 1gm IV q8h  Vancomycin 1gm IV x1 in ED then 1gm IV q8h  Per consult note, MD has stated vancomycin can be stopped if MRSA PCR is negative  Follow renal function, check trough if remains on vancomycin > 48hrs  Height: 6\' 2"  (188 cm) Weight: 175 lb (79.4 kg) IBW/kg (Calculated) : 82.2  Temp (24hrs), Avg:98.3 F (36.8 C), Min:98.2 F (36.8 C), Max:98.4 F (36.9 C)   Recent Labs Lab 04/21/16 1330  WBC 14.1*  CREATININE 0.84    Estimated Creatinine Clearance: 131.3 mL/min (by C-G formula based on SCr of 0.84 mg/dL).    No Known Allergies  Antimicrobials this admission: 12/26 >> vanco 12/26 >> cefepime  Dose adjustments this admission:  Microbiology results: 12/26 MRSA PCR:   Thank you for allowing pharmacy to be a part of this patient's care.  Juliette Alcideustin Zeigler, PharmD, BCPS.   Pager: [phone] 04/21/2016 6:08 PM

## 2016-04-21 NOTE — ED Notes (Signed)
IV start x 2 without success

## 2016-04-21 NOTE — ED Notes (Signed)
One attempt made to call report, RN will call back in a few minutes.

## 2016-04-21 NOTE — H&P (Signed)
History and Physical    [name]:[id number] DOB: 03-25-1976 DOA: 04/21/2016  Referring MD/NP/PA: Lucita Lora Espina PCP: Dorrene German, MD  Outpatient Specialists: [facility] Patient coming from: home  Chief Complaint: SOB and chest pain  HPI: [name] is a 40 y.o. 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  He has had 7 admissions over the last year.  The patient states that approximately 2 days ago he developed 10 out of 10 pain located along the right lateral chest wall which radiates up to his right shoulder and right scapula. The pain is worse with deep inspiration and he has had a dry cough. He has had some sweats the last 2 nights but denies known fevers or chills. The pain has been constant. He continued to take his basal morphine ES 60 mg once daily and increased his immediate release morphine from 30 mg once a day to 30 mg twice a day. He did not attempt to take any NSAIDs. His pain persisted and he had some mild shortness of breath today so he presented to the emergency department for evaluation. He denies pain in his calves or swelling of his legs. He has never had a DVT or pulmonary embolism. He denies history of heart attack. He states he is trying to get into [facility] for ongoing sickle cell maintenance.  Recent admissions: 12/05/2015-12/10/2015  pain crisis right shoulder 01/02/2016-01/08/2016 Pain crisis back and legs 02/11/2016-02/17/2016  pain crisis and back and left hip 03/22/2016-03/26/2016  pain crisis and back and legs  ED Course:  Afebrile, pulse in the 70s to 90s, respirations 16-18, blood pressure stable, 93-97 percent on room air. White blood cell count 14.1 (baseline 11-15), hemoglobin 11 (baseline 9-11), bilirubin 2.6 (baseline 2-6), retic 1.3 (baseline 2.5-5). Chest x-ray demonstrated a new right lower lobe pneumonia.  EKG with some new T-wave inversions  in the inferior leads. I-STAT troponin is pending.  I added a d-dimer as well.  Review of Systems:  General:  Denies fevers, chills, weight loss or gain.  Sweats at night last 2 nights. HEENT:  Denies changes to hearing and vision, rhinorrhea, sinus congestion, sore throat CV:  Denies palpitations, lower extremity edema.  PULM:  Denies wheezing, positive cough.   GI:  Denies nausea, vomiting, constipation, diarrhea.   GU:  Denies dysuria, frequency, urgency ENDO:  Denies polyuria, polydipsia.   HEME:  Denies hematemesis, blood in stools, melena, abnormal bruising or bleeding.  LYMPH:  Denies lymphadenopathy.   MSK:  Denies arthralgias, myalgias.   DERM:  Denies skin rash or ulcer.   NEURO:  Denies focal numbness, weakness, slurred speech, confusion, facial droop.  PSYCH:  Denies anxiety and depression.    Past Medical History:  Diagnosis Date  . Avascular necrosis of hip (HCC)    bilateral  . Avascular necrosis of hip, left (HCC) 08/27/2011  . Blood transfusion   . Infection of bone, shoulder region (HCC)    left shoulder  . Pneumonia   . Sickle cell crisis [facility])     Past Surgical History:  Procedure Laterality Date  . BONE GRAFT HIP ILIAC CREST    . JOINT REPLACEMENT  2006   right total hip arthroplasty  . Orif right hip fracture  1995     reports that he has been smoking Cigarettes.  He has been smoking about 0.50 packs per day. He has never used smokeless  tobacco. He reports that he does not drink alcohol or use drugs.  No Known Allergies  Family History  Problem Relation Age of Onset  . Adopted: Yes    Prior to Admission medications   Medication Sig Start Date End Date Taking? Authorizing Provider  cyclobenzaprine (FLEXERIL) 10 MG tablet Take 10 mg by mouth at bedtime.   Yes Historical Provider, MD  folic acid (FOLVITE) 1 MG tablet Take 1 mg by mouth every evening.    Yes Historical Provider, MD  morphine (MS CONTIN) 60 MG 12 hr tablet Take 1 tablet (60 mg total)  by mouth 2 (two) times daily. 11/20/11  Yes Grayce SessionsMichelle P Edwards, NP  morphine (MSIR) 30 MG tablet Take 1 tablet (30 mg total) by mouth daily as needed for severe pain (pain). Resume after completing dilaudid. 02/17/16  Yes Altha HarmMichelle A [name], MD  promethazine (PHENERGAN) 25 MG tablet Take 25 mg by mouth every 6 (six) hours as needed for nausea (nausea).    Yes Historical Provider, MD  HYDROmorphone (DILAUDID) 2 MG tablet Take 1 tablet (2 mg total) by mouth every 6 (six) hours as needed for severe pain. Resume use of Morphine Sulfate after completion of this medication Patient not taking: Reported on 04/21/2016 02/17/16   Altha HarmMichelle A [name], MD    Physical Exam: Vitals:   04/21/16 1542 04/21/16 1600 04/21/16 1630 04/21/16 1700  BP: 116/78 127/76 114/71 122/68  Pulse: 80 76 75 71  Resp: 18 (!) 32 22 18  Temp: 98.2 F (36.8 C)     TempSrc: Oral     SpO2: 95% 93% 94% 94%  Weight:      Height:        Constitutional: NAD, calm, comfortable Eyes: PERRL, lids and conjunctivae normal ENMT: Mucous membranes are moist. Posterior pharynx clear of any exudate or lesions.Normal dentition.  Neck: normal, supple, no masses, no thyromegaly Respiratory:  Rales at the bilateral bases that clear somewhat with repeat respirations, no wheezes, no rhonchi. Normal respiratory effort. No accessory muscle use.  Cardiovascular: Regular rate and rhythm, no murmurs / rubs / gallops. No extremity edema. 2+ pedal pulses. No carotid bruits.  Abdomen: no tenderness, no masses palpated. No hepatosplenomegaly. Bowel sounds positive.  Negative Murphy sign Musculoskeletal: no clubbing / cyanosis. No joint deformity upper and lower extremities. Good ROM, no contractures. Normal muscle tone. No palpable cords or calf swelling. Skin: no rashes, lesions, ulcers. No induration Neurologic: CN 2-12 grossly intact. Sensation intact, DTR normal. Strength 5/5 in all 4.  Psychiatric: Normal judgment and insight. Alert and oriented x  3. Normal mood.   Labs on Admission: I have personally reviewed following labs and imaging studies  CBC:  Recent Labs Lab 04/21/16 1330  WBC 14.1*  NEUTROABS 10.0*  HGB 11.0*  HCT 29.4*  MCV 87.2  PLT 308   Basic Metabolic Panel:  Recent Labs Lab 04/21/16 1330  NA 135  K 4.6  CL 105  CO2 26  GLUCOSE 96  BUN 13  CREATININE 0.84  CALCIUM 9.2   GFR: Estimated Creatinine Clearance: 131.3 mL/min (by C-G formula based on SCr of 0.84 mg/dL). Liver Function Tests:  Recent Labs Lab 04/21/16 1330  AST 30  ALT 10*  ALKPHOS 75  BILITOT 2.6*  PROT 8.7*  ALBUMIN 5.1*   No results for input(s): LIPASE, AMYLASE in the last 168 hours. No results for input(s): AMMONIA in the last 168 hours. Coagulation Profile: No results for input(s): INR, PROTIME in the last 168 hours.  Cardiac Enzymes: No results for input(s): CKTOTAL, CKMB, CKMBINDEX, TROPONINI in the last 168 hours. BNP (last 3 results) No results for input(s): PROBNP in the last 8760 hours. HbA1C: No results for input(s): HGBA1C in the last 72 hours. CBG: No results for input(s): GLUCAP in the last 168 hours. Lipid Profile: No results for input(s): CHOL, HDL, LDLCALC, TRIG, CHOLHDL, LDLDIRECT in the last 72 hours. Thyroid Function Tests: No results for input(s): TSH, T4TOTAL, FREET4, T3FREE, THYROIDAB in the last 72 hours. Anemia Panel:  Recent Labs  04/21/16 1330  RETICCTPCT 1.3   Urine analysis:    Component Value Date/Time   COLORURINE YELLOW 03/23/2016 2144   APPEARANCEUR CLEAR 03/23/2016 2144   LABSPEC 1.009 03/23/2016 2144   PHURINE 5.5 03/23/2016 2144   GLUCOSEU NEGATIVE 03/23/2016 2144   HGBUR NEGATIVE 03/23/2016 2144   BILIRUBINUR NEGATIVE 03/23/2016 2144   KETONESUR NEGATIVE 03/23/2016 2144   PROTEINUR NEGATIVE 03/23/2016 2144   UROBILINOGEN 0.2 10/25/2013 0020   NITRITE NEGATIVE 03/23/2016 2144   LEUKOCYTESUR NEGATIVE 03/23/2016 2144   Sepsis  Labs: @LABRCNTIP (procalcitonin:4,lacticidven:4) )No results found for this or any previous visit (from the past 240 hour(s)).   Radiological Exams on Admission: Dg Chest 2 View  Result Date: 04/21/2016 CLINICAL DATA:  Shortness of breath.  Sickle cell disease. EXAM: CHEST  2 VIEW COMPARISON:  03/22/2016 FINDINGS: The heart size and mediastinal contours are within normal limits. Right lung scarring again demonstrated. New opacity seen in the medial right middle lobe, suspicious for pneumonia. No evidence pleural effusion. Left lung is clear. IMPRESSION: New opacity in medial right lower lobe, suspicious for pneumonia. Recommend clinical correlation; consider followup PA and lateral chest X-ray in 3-4 weeks following trial of antibiotic therapy to ensure resolution. Electronically Signed   By: Myles RosenthalJohn  Stahl M.D.   On: 04/21/2016 12:14    EKG: Independently reviewed. NRS with stable T-wave inversion in III, but NEW T-wave flattening of II and T-wave inversion in aVF  Assessment/Plan Active Problems:   HCAP (healthcare-associated pneumonia)   Dyspnea with chest pain and shortness of breath.  Ddx includes HCAP and acute chest.  Also ACS in differential due to new T-wave flattening and inversions in the inferior leads.  Also consider PE.  Currently on room air and breathing comfortably.  Labs suggest mild dehydration.  -  Admit to telemetry  -  Cycle troponins -  F/u D-dimer -  ECHO -  Aspirin x 1 -  Repeat ECG  -  Admit to stepdown with low threshold to transfer to stepdown  -  Continuous pulse oximetry -  Blood cultures -  Procalcitonin -  Oxygen to keep O2 sat > 96% -  Vancomycin and cefepime  -  MRSA PCR and d/c antibiotics if negative -  Incentive spirometry -  IVF  -  Type and screen -  Dilaudid PCA -  Toradol  -  If decompensates overnight, transfer to stepdown for partial exchange transfusion:  Phlebotomize 500mL and transfuse 1 unit PRBC  Sickle Cell Disease, needs referral for  possible hydroxyurea   Tobacco abuse, counseled cessation  -  Nicotine patch ordered  Baseline leukocytosis, reactive due to SSD  DVT prophylaxis: lovenox  Code Status: full code Family Communication: patient alone  Disposition Plan: likely to home in 3-4 days depending on progression  Consults called: Case discussed with Dr. Hyman HopesJegede Admission status: inpatient, Evelena Leydenmed-surg  Stepheni Cameron MD [facility] Pager [phone]  If 7PM-7AM, please contact night-coverage [email or web address] Password [email or web address]  04/21/2016, 5:46 PM

## 2016-04-21 NOTE — ED Notes (Signed)
Notified pharmacy to verify TORADOL for pain control.

## 2016-04-22 ENCOUNTER — Inpatient Hospital Stay (HOSPITAL_COMMUNITY): Payer: Medicare Other

## 2016-04-22 DIAGNOSIS — J189 Pneumonia, unspecified organism: Principal | ICD-10-CM

## 2016-04-22 DIAGNOSIS — R079 Chest pain, unspecified: Secondary | ICD-10-CM

## 2016-04-22 DIAGNOSIS — D5700 Hb-SS disease with crisis, unspecified: Secondary | ICD-10-CM

## 2016-04-22 DIAGNOSIS — R0781 Pleurodynia: Secondary | ICD-10-CM

## 2016-04-22 LAB — CBC WITH DIFFERENTIAL/PLATELET
BASOS PCT: 1 %
Basophils Absolute: 0.1 10*3/uL (ref 0.0–0.1)
EOS ABS: 0.7 10*3/uL (ref 0.0–0.7)
Eosinophils Relative: 7 %
HCT: 28.8 % — ABNORMAL LOW (ref 39.0–52.0)
HEMOGLOBIN: 10.5 g/dL — AB (ref 13.0–17.0)
Lymphocytes Relative: 32 %
Lymphs Abs: 3.2 10*3/uL (ref 0.7–4.0)
MCH: 31.8 pg (ref 26.0–34.0)
MCHC: 36.5 g/dL — AB (ref 30.0–36.0)
MCV: 87.3 fL (ref 78.0–100.0)
MONOS PCT: 11 %
Monocytes Absolute: 1.1 10*3/uL — ABNORMAL HIGH (ref 0.1–1.0)
NEUTROS PCT: 49 %
Neutro Abs: 5 10*3/uL (ref 1.7–7.7)
PLATELETS: 269 10*3/uL (ref 150–400)
RBC: 3.3 MIL/uL — AB (ref 4.22–5.81)
RDW: 14.8 % (ref 11.5–15.5)
WBC: 10 10*3/uL (ref 4.0–10.5)

## 2016-04-22 LAB — ECHOCARDIOGRAM COMPLETE
Height: 74 in
WEIGHTICAEL: 2800 [oz_av]

## 2016-04-22 LAB — COMPREHENSIVE METABOLIC PANEL
ALK PHOS: 71 U/L (ref 38–126)
ALT: 8 U/L — AB (ref 17–63)
AST: 19 U/L (ref 15–41)
Albumin: 4.5 g/dL (ref 3.5–5.0)
Anion gap: 9 (ref 5–15)
BUN: 12 mg/dL (ref 6–20)
CALCIUM: 9.1 mg/dL (ref 8.9–10.3)
CO2: 27 mmol/L (ref 22–32)
CREATININE: 0.75 mg/dL (ref 0.61–1.24)
Chloride: 103 mmol/L (ref 101–111)
Glucose, Bld: 91 mg/dL (ref 65–99)
Potassium: 4.1 mmol/L (ref 3.5–5.1)
Sodium: 139 mmol/L (ref 135–145)
Total Bilirubin: 2.3 mg/dL — ABNORMAL HIGH (ref 0.3–1.2)
Total Protein: 8.4 g/dL — ABNORMAL HIGH (ref 6.5–8.1)

## 2016-04-22 LAB — TROPONIN I: Troponin I: <0.03 ng/mL (ref <0.03)

## 2016-04-22 LAB — MRSA PCR SCREENING: MRSA BY PCR: NEGATIVE

## 2016-04-22 LAB — RETICULOCYTES
RBC.: 3.3 MIL/uL — ABNORMAL LOW (ref 4.22–5.81)
Retic Count, Absolute: 42.9 10*3/uL (ref 19.0–186.0)
Retic Ct Pct: 1.3 % (ref 0.4–3.1)

## 2016-04-22 MED ORDER — HYDROMORPHONE HCL 2 MG/ML IJ SOLN: 2.0000 mg | INTRAMUSCULAR | Status: DC | PRN

## 2016-04-22 MED ORDER — HYDROMORPHONE 1 MG/ML IV SOLN
INTRAVENOUS | Status: DC
  Administered 2016-04-22: 6.3 mg via INTRAVENOUS
  Administered 2016-04-22: 25 mg via INTRAVENOUS
  Administered 2016-04-22: 6.3 mg via INTRAVENOUS
  Administered 2016-04-23: 4.9 mg via INTRAVENOUS
  Administered 2016-04-23: 6.22 mg via INTRAVENOUS
  Administered 2016-04-23: 12.6 mg via INTRAVENOUS
  Administered 2016-04-23: 4.2 mg via INTRAVENOUS
  Administered 2016-04-23: 7 mg via INTRAVENOUS
  Administered 2016-04-23: 6.09 mg via INTRAVENOUS
  Administered 2016-04-23: 25 mg via INTRAVENOUS
  Administered 2016-04-24: 7 mg via INTRAVENOUS
  Administered 2016-04-24: 25 mg via INTRAVENOUS
  Administered 2016-04-24: 6.3 mg via INTRAVENOUS
  Administered 2016-04-24: 4.9 mg via INTRAVENOUS
  Administered 2016-04-24: 8.4 mg via INTRAVENOUS
  Filled 2016-04-22 (×5): qty 25

## 2016-04-22 NOTE — Progress Notes (Signed)
  Echocardiogram 2D Echocardiogram has been performed.  [name], [name] M 04/22/2016, 11:49 AM

## 2016-04-22 NOTE — Progress Notes (Signed)
Pharmacy Antibiotic Note  [name] is a 40 y.o. male admitted on 04/21/2016 with pneumonia.  Pharmacy has been consulted for vancomycin and cefepime dosing. CXR shows RLL pna, confirmed on CT  Plan:  Continue Cefepime 2gm x1 in ED then cefepime 1gm IV q8h  Per MD instructions, have stopped vancomycin as MRSA PCR was negative (negative result provides > 98% NPV for MRSA pneumonia)  Height: 6\' 2"  (188 cm) Weight: 175 lb (79.4 kg) IBW/kg (Calculated) : 82.2  Temp (24hrs), Avg:98.1 F (36.7 C), Min:97.8 F (36.6 C), Max:98.4 F (36.9 C)   Recent Labs Lab 04/21/16 1330 04/22/16 0514  WBC 14.1* 10.0  CREATININE 0.84 0.75    Estimated Creatinine Clearance: 137.8 mL/min (by C-G formula based on SCr of 0.75 mg/dL).    No Known Allergies  Antimicrobials this admission: 12/26 >> vanco 12/27 12/26 >> cefepime  Dose adjustments this admission:  Microbiology results: 12/26 MRSA PCR: neg  Thank you for allowing pharmacy to be a part of this patient's care.  [name], PharmD, BCPS Pager: [phone] 04/22/2016, 10:23 AM

## 2016-04-22 NOTE — Progress Notes (Signed)
Patient ID: [name], male   DOB: [birth year], 40 y.o.   MRN: [id number] Subjective:  [name] is a 40 y.o. male with history of sickle cell disease with history of avascular necrosis and acute chest and frequent admissions to the hospital admitted yesterday for Sickle Cell Crisis and HCAP. He is on broad spectrum antibiotics, and pain management protocol. He has no new complaint except for ongoing pain "all over". He rates his pain at 8/10 today. No fever, no chest pain. No dysuria.   Objective:  Vital signs in last 24 hours:  Vitals:   04/22/16 0030 04/22/16 0144 04/22/16 0400 04/22/16 0444  BP: 123/67   114/64  Pulse: 66   62  Resp: 12 12 13 13   Temp: 98 F (36.7 C)   97.8 F (36.6 C)  TempSrc: Oral   Oral  SpO2: 97% 96% 97% 97%  Weight:      Height:        Intake/Output from previous day:   Intake/Output Summary (Last 24 hours) at 04/22/16 1354 Last data filed at 04/22/16 0501  Gross per 24 hour  Intake          1035.67 ml  Output                0 ml  Net          1035.67 ml   Physical Exam: General: Alert, awake, oriented x3, in no acute distress.  HEENT: [place]/AT PEERL, EOMI Neck: Trachea midline,  no masses, no thyromegal,y no JVD, no carotid bruit OROPHARYNX:  Moist, No exudate/ erythema/lesions.  Heart: Regular rate and rhythm, without murmurs, rubs, gallops, PMI non-displaced, no heaves or thrills on palpation.  Lungs: Clear to auscultation, no wheezing or rhonchi noted. No increased vocal fremitus resonant to percussion  Abdomen: Soft, nontender, nondistended, positive bowel sounds, no masses no hepatosplenomegaly noted..  Neuro: No focal neurological deficits noted cranial nerves II through XII grossly intact. DTRs 2+ bilaterally upper and lower extremities. Strength 5 out of 5 in bilateral upper and lower extremities. Musculoskeletal: No warm swelling or erythema around joints, no spinal tenderness noted. Psychiatric: Patient alert and oriented x3, good  insight and cognition, good recent to remote recall. Lymph node survey: No cervical axillary or inguinal lymphadenopathy noted.  Lab Results:  Basic Metabolic Panel:    Component Value Date/Time   NA 139 04/22/2016 0514   K 4.1 04/22/2016 0514   CL 103 04/22/2016 0514   CO2 27 04/22/2016 0514   BUN 12 04/22/2016 0514   CREATININE 0.75 04/22/2016 0514   GLUCOSE 91 04/22/2016 0514   CALCIUM 9.1 04/22/2016 0514   CBC:    Component Value Date/Time   WBC 10.0 04/22/2016 0514   HGB 10.5 (L) 04/22/2016 0514   HCT 28.8 (L) 04/22/2016 0514   PLT 269 04/22/2016 0514   MCV 87.3 04/22/2016 0514   NEUTROABS 5.0 04/22/2016 0514   LYMPHSABS 3.2 04/22/2016 0514   MONOABS 1.1 (H) 04/22/2016 0514   EOSABS 0.7 04/22/2016 0514   BASOSABS 0.1 04/22/2016 0514    Recent Results (from the past 240 hour(s))  MRSA PCR Screening     Status: None   Collection Time: 04/21/16  9:20 PM  Result Value Ref Range Status   MRSA by PCR NEGATIVE NEGATIVE Final    Comment:        The GeneXpert MRSA Assay (FDA approved for NASAL specimens only), is one component of a comprehensive MRSA colonization surveillance program. It  is not intended to diagnose MRSA infection nor to guide or monitor treatment for MRSA infections.     Studies/Results: Dg Chest 2 View  Result Date: 04/22/2016 CLINICAL DATA:  Healthcare associated pneumonia EXAM: CHEST  2 VIEW COMPARISON:  04/21/2016 FINDINGS: Cardiomediastinal silhouette is stable. Persistent streaky infiltrate/pneumonia right lower lobe posterior medially. Follow-up to resolution is recommended. IMPRESSION: Persistent streaky infiltrate/ pneumonia in right lower lobe posterior medially. Follow-up to resolution is recommended. Electronically Signed   By: Natasha MeadLiviu  Pop M.D.   On: 04/22/2016 08:40   Dg Chest 2 View  Result Date: 04/21/2016 CLINICAL DATA:  Shortness of breath.  Sickle cell disease. EXAM: CHEST  2 VIEW COMPARISON:  03/22/2016 FINDINGS: The heart size  and mediastinal contours are within normal limits. Right lung scarring again demonstrated. New opacity seen in the medial right middle lobe, suspicious for pneumonia. No evidence pleural effusion. Left lung is clear. IMPRESSION: New opacity in medial right lower lobe, suspicious for pneumonia. Recommend clinical correlation; consider followup PA and lateral chest X-ray in 3-4 weeks following trial of antibiotic therapy to ensure resolution. Electronically Signed   By: Myles RosenthalJohn  Stahl M.D.   On: 04/21/2016 12:14   Ct Angio Chest Pe W Or Wo Contrast  Result Date: 04/21/2016 CLINICAL DATA:  Elevated D-dimer.  Chest pain. EXAM: CT ANGIOGRAPHY CHEST WITH CONTRAST TECHNIQUE: Multidetector CT imaging of the chest was performed using the standard protocol during bolus administration of intravenous contrast. Multiplanar CT image reconstructions and MIPs were obtained to evaluate the vascular anatomy. CONTRAST:  100 mL Isovue 370 COMPARISON:  None. FINDINGS: Cardiovascular: Satisfactory opacification of the pulmonary arteries to the segmental level. No evidence of pulmonary embolism. Normal heart size. No pericardial effusion. Mediastinum/Nodes: No enlarged mediastinal, hilar, or axillary lymph nodes. Thyroid gland, trachea, and esophagus demonstrate no significant findings. Lungs/Pleura: Right lower lobe airspace disease. Trace right pleural effusion. Scattered subpleural cystic areas bilaterally. Upper Abdomen: No acute abnormality. Musculoskeletal: No acute osseous abnormality. Patchy sclerosis throughout the osseous structures consistent with history of sickle cell disease. Review of the MIP images confirms the above findings. IMPRESSION: 1. No evidence of pulmonary embolus. 2. Right lower lobe pneumonia. Followup PA and lateral chest X-ray is recommended in 3-4 weeks following trial of antibiotic therapy to ensure resolution and exclude underlying malignancy. Electronically Signed   By: Elige KoHetal  Patel   On: 04/21/2016  18:23    Medications: Scheduled Meds: . aspirin EC  81 mg Oral Daily  . ceFEPime (MAXIPIME) IV  1 g Intravenous Q8H  . cyclobenzaprine  10 mg Oral QHS  . enoxaparin (LOVENOX) injection  40 mg Subcutaneous Q24H  . folic acid  1 mg Oral QPM  . HYDROmorphone   Intravenous Q4H  . ketorolac  30 mg Intravenous Q8H  . morphine  60 mg Oral BID  . nicotine  7 mg Transdermal Daily  . pantoprazole  40 mg Oral Daily  . polyethylene glycol  17 g Oral Daily  . senna-docusate  1 tablet Oral BID   Continuous Infusions: . dextrose 5 % and 0.45% NaCl 125 mL/hr at 04/22/16 1233   PRN Meds:.diphenhydrAMINE **OR** diphenhydrAMINE (BENADRYL) IVPB(SICKLE CELL ONLY), HYDROmorphone (DILAUDID) injection, naloxone **AND** sodium chloride flush, ondansetron (ZOFRAN) IV, promethazine  Consultants:  None  Procedures:  None  Antibiotics:  Cefepime and Vancomycin Day 2  Assessment/Plan: Active Problems:   HCAP (healthcare-associated pneumonia)   1. Hb SS with crisis:Continue Dilaudid via PCA and add clinician assisted doses, continue IVF and Toradol 2. HCAP: Patient  on IV Vancomycin and Cefepime, will continue for now, awaiting culture report, if MRSA negative, will discontinue and narrow coverage. 3. Chronic Anemia: Hb is stable today at 10.5. Will continue to monitor 4. Chronic pain: Continue home pain medications 5. Tobacco abuse: Aryav was counseled on the dangers of tobacco use, and was advised to quit. Reviewed strategies to maximize success, including removing cigarettes and smoking materials from environment, stress management and support of family/friends.  Code Status: Full Code Family Communication: N/A Disposition Plan: Not yet ready for discharge  Nastasha Reising  If 7PM-7AM, please contact night-coverage.  04/22/2016, 1:54 PM  LOS: 1 day

## 2016-04-23 LAB — COMPREHENSIVE METABOLIC PANEL
ALK PHOS: 65 U/L (ref 38–126)
ALT: 7 U/L — AB (ref 17–63)
ANION GAP: 8 (ref 5–15)
AST: 16 U/L (ref 15–41)
Albumin: 4.2 g/dL (ref 3.5–5.0)
BUN: 7 mg/dL (ref 6–20)
CALCIUM: 9 mg/dL (ref 8.9–10.3)
CO2: 28 mmol/L (ref 22–32)
CREATININE: 0.69 mg/dL (ref 0.61–1.24)
Chloride: 105 mmol/L (ref 101–111)
Glucose, Bld: 90 mg/dL (ref 65–99)
Potassium: 4.1 mmol/L (ref 3.5–5.1)
Sodium: 141 mmol/L (ref 135–145)
Total Bilirubin: 1.7 mg/dL — ABNORMAL HIGH (ref 0.3–1.2)
Total Protein: 7.4 g/dL (ref 6.5–8.1)

## 2016-04-23 LAB — CBC WITH DIFFERENTIAL/PLATELET
BASOS ABS: 0.1 10*3/uL (ref 0.0–0.1)
Basophils Relative: 1 %
EOS ABS: 0.9 10*3/uL — AB (ref 0.0–0.7)
Eosinophils Relative: 9 %
HCT: 25.3 % — ABNORMAL LOW (ref 39.0–52.0)
Hemoglobin: 9 g/dL — ABNORMAL LOW (ref 13.0–17.0)
LYMPHS PCT: 34 %
Lymphs Abs: 3.3 10*3/uL (ref 0.7–4.0)
MCH: 30.4 pg (ref 26.0–34.0)
MCHC: 35.6 g/dL (ref 30.0–36.0)
MCV: 85.5 fL (ref 78.0–100.0)
MONO ABS: 1 10*3/uL (ref 0.1–1.0)
Monocytes Relative: 10 %
NEUTROS PCT: 46 %
Neutro Abs: 4.3 10*3/uL (ref 1.7–7.7)
Platelets: 244 10*3/uL (ref 150–400)
RBC: 2.96 MIL/uL — ABNORMAL LOW (ref 4.22–5.81)
RDW: 15.4 % (ref 11.5–15.5)
WBC: 9.6 10*3/uL (ref 4.0–10.5)

## 2016-04-23 LAB — PROCALCITONIN: Procalcitonin: <0.1 ng/mL

## 2016-04-23 MED ORDER — LEVOFLOXACIN IN D5W 750 MG/150ML IV SOLN
750.0000 mg | INTRAVENOUS | Status: DC
  Administered 2016-04-23: 750 mg via INTRAVENOUS
  Filled 2016-04-23 (×2): qty 150

## 2016-04-23 NOTE — Care Management Note (Signed)
Case Management Note  Patient Details  Name: [name] MRN: [id number] Date of Birth: October 26, 1975  Subjective/Objective:   40 y.o. M admitted with HCAP and SCC. Continues to receive IV abx and PCA Dilaudid for pain control. Independent at home. No CM needs at present.                  Action/Plan:CM will follow closely for disposition/discharge needs.    Expected Discharge Date:   (unknown)               Expected Discharge Plan:  Home/Self Care  In-House Referral:  NA  Discharge planning Services  NA  Post Acute Care Choice:  NA Choice offered to:  Patient  DME Arranged:    DME Agency:     HH Arranged:    HH Agency:     Status of Service:  In process, will continue to follow  If discussed at Long Length of Stay Meetings, dates discussed:    Additional Comments:  [name], [name], RN 04/23/2016, 9:41 AM

## 2016-04-23 NOTE — Progress Notes (Signed)
Patient ID: Malik PortHoward E Pernell, male   DOB: May 22, 1975, 40 y.o.   MRN: [id number] Subjective:  [name] is a 40 y.o. 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 He was admitted for sickle cell pain crisis and HCAP Vs ACS. Patient is currently on IV Vanco and Cefepime and sickle cell pain management protocol. He is slowly improving today but still in a lot of pain. He rates his pain at an 8/10. He said he has no cough, no chest pain, no SOB, no nausea or vomiting, no fever.  Objective:  Vital signs in last 24 hours:  Vitals:   04/23/16 0446 04/23/16 0735 04/23/16 1002 04/23/16 1223  BP:      Pulse:      Resp: (!) 9 13 18 12   Temp:      TempSrc:      SpO2: 95% 95% 100% 93%  Weight:      Height:       Intake/Output from previous day:  Intake/Output Summary (Last 24 hours) at 04/23/16 1421 Last data filed at 04/23/16 0449  Gross per 24 hour  Intake                0 ml  Output             1000 ml  Net            -1000 ml   Physical Exam: General: Alert, awake, oriented x3, in no acute distress.  HEENT: [place]/AT PEERL, EOMI Neck: Trachea midline,  no masses, no thyromegal,y no JVD, no carotid bruit OROPHARYNX:  Moist, No exudate/ erythema/lesions.  Heart: Regular rate and rhythm, without murmurs, rubs, gallops, PMI non-displaced, no heaves or thrills on palpation.  Lungs: Clear to auscultation, no wheezing or rhonchi noted. No increased vocal fremitus resonant to percussion  Abdomen: Soft, nontender, nondistended, positive bowel sounds, no masses no hepatosplenomegaly noted..  Neuro: No focal neurological deficits noted cranial nerves II through XII grossly intact. DTRs 2+ bilaterally upper and lower extremities. Strength 5 out of 5 in bilateral upper and lower extremities. Musculoskeletal: No warm swelling or erythema around joints, no spinal tenderness noted. Psychiatric: Patient alert and  oriented x3, good insight and cognition, good recent to remote recall. Lymph node survey: No cervical axillary or inguinal lymphadenopathy noted.  Lab Results:  Basic Metabolic Panel:    Component Value Date/Time   NA 141 04/23/2016 0517   K 4.1 04/23/2016 0517   CL 105 04/23/2016 0517   CO2 28 04/23/2016 0517   BUN 7 04/23/2016 0517   CREATININE 0.69 04/23/2016 0517   GLUCOSE 90 04/23/2016 0517   CALCIUM 9.0 04/23/2016 0517   CBC:    Component Value Date/Time   WBC 9.6 04/23/2016 0517   HGB 9.0 (L) 04/23/2016 0517   HCT 25.3 (L) 04/23/2016 0517   PLT 244 04/23/2016 0517   MCV 85.5 04/23/2016 0517   NEUTROABS 4.3 04/23/2016 0517   LYMPHSABS 3.3 04/23/2016 0517   MONOABS 1.0 04/23/2016 0517   EOSABS 0.9 (H) 04/23/2016 0517   BASOSABS 0.1 04/23/2016 0517    Recent Results (from the past 240 hour(s))  MRSA PCR Screening     Status: None   Collection Time: 04/21/16  9:20 PM  Result Value Ref Range Status   MRSA by PCR NEGATIVE NEGATIVE Final    Comment:        The GeneXpert MRSA Assay (FDA  approved for NASAL specimens only), is one component of a comprehensive MRSA colonization surveillance program. It is not intended to diagnose MRSA infection nor to guide or monitor treatment for MRSA infections.     Studies/Results: Dg Chest 2 View  Result Date: 04/22/2016 CLINICAL DATA:  Healthcare associated pneumonia EXAM: CHEST  2 VIEW COMPARISON:  04/21/2016 FINDINGS: Cardiomediastinal silhouette is stable. Persistent streaky infiltrate/pneumonia right lower lobe posterior medially. Follow-up to resolution is recommended. IMPRESSION: Persistent streaky infiltrate/ pneumonia in right lower lobe posterior medially. Follow-up to resolution is recommended. Electronically Signed   By: Natasha MeadLiviu  Pop M.D.   On: 04/22/2016 08:40   Ct Angio Chest Pe W Or Wo Contrast  Result Date: 04/21/2016 CLINICAL DATA:  Elevated D-dimer.  Chest pain. EXAM: CT ANGIOGRAPHY CHEST WITH CONTRAST  TECHNIQUE: Multidetector CT imaging of the chest was performed using the standard protocol during bolus administration of intravenous contrast. Multiplanar CT image reconstructions and MIPs were obtained to evaluate the vascular anatomy. CONTRAST:  100 mL Isovue 370 COMPARISON:  None. FINDINGS: Cardiovascular: Satisfactory opacification of the pulmonary arteries to the segmental level. No evidence of pulmonary embolism. Normal heart size. No pericardial effusion. Mediastinum/Nodes: No enlarged mediastinal, hilar, or axillary lymph nodes. Thyroid gland, trachea, and esophagus demonstrate no significant findings. Lungs/Pleura: Right lower lobe airspace disease. Trace right pleural effusion. Scattered subpleural cystic areas bilaterally. Upper Abdomen: No acute abnormality. Musculoskeletal: No acute osseous abnormality. Patchy sclerosis throughout the osseous structures consistent with history of sickle cell disease. Review of the MIP images confirms the above findings. IMPRESSION: 1. No evidence of pulmonary embolus. 2. Right lower lobe pneumonia. Followup PA and lateral chest X-ray is recommended in 3-4 weeks following trial of antibiotic therapy to ensure resolution and exclude underlying malignancy. Electronically Signed   By: Elige KoHetal  Patel   On: 04/21/2016 18:23    Medications: Scheduled Meds: . aspirin EC  81 mg Oral Daily  . cyclobenzaprine  10 mg Oral QHS  . enoxaparin (LOVENOX) injection  40 mg Subcutaneous Q24H  . folic acid  1 mg Oral QPM  . HYDROmorphone   Intravenous Q4H  . ketorolac  30 mg Intravenous Q8H  . levofloxacin (LEVAQUIN) IV  750 mg Intravenous Q24H  . morphine  60 mg Oral BID  . nicotine  7 mg Transdermal Daily  . pantoprazole  40 mg Oral Daily  . polyethylene glycol  17 g Oral Daily  . senna-docusate  1 tablet Oral BID   Continuous Infusions: . dextrose 5 % and 0.45% NaCl 125 mL/hr at 04/23/16 0734   PRN Meds:.diphenhydrAMINE **OR** diphenhydrAMINE (BENADRYL) IVPB(SICKLE  CELL ONLY), HYDROmorphone (DILAUDID) injection, naloxone **AND** sodium chloride flush, ondansetron (ZOFRAN) IV, promethazine  Consultants:  None  Procedures:  None  Antibiotics:  Day 3 on IV Vancomycin and Cefepime, continue  Assessment/Plan: Active Problems:   HCAP (healthcare-associated pneumonia)  1. Hb [place] with crisis: Continue PCA, and clinician assisted doses at 2 mg every 2-3 hours prn. Continue Toradol and IVF. Encourage ICS use.  2. HCAP Vs ACS: Patient is hemodynamically stable, no desaturation, denies chest pain, no cough, no SOB. MRSA negative. Will narrow coverage to Levaquin 750 mg daily. Continue IVF and ICS use 3. Anemia of chronic disease: Hb stable.  4. Chronic pain: Continue MS Contin  BID.  5. Tobacco abuse: [name] was counseled on the dangers of tobacco use, and was advised to quit. Reviewed strategies to maximize success, including removing cigarettes and smoking materials from environment, stress management and support of family/friends.  Code Status: Full Code Family Communication: N/A Disposition Plan: For possible discharge tomorrow  Janzen Sacks  If 7PM-7AM, please contact night-coverage.  04/23/2016, 2:21 PM  LOS: 2 days

## 2016-04-24 ENCOUNTER — Inpatient Hospital Stay (HOSPITAL_COMMUNITY): Payer: Medicare Other

## 2016-04-24 DIAGNOSIS — R7989 Other specified abnormal findings of blood chemistry: Secondary | ICD-10-CM

## 2016-04-24 LAB — COMPREHENSIVE METABOLIC PANEL
ALBUMIN: 4.6 g/dL (ref 3.5–5.0)
ALT: 9 U/L — ABNORMAL LOW (ref 17–63)
ANION GAP: 6 (ref 5–15)
AST: 19 U/L (ref 15–41)
Alkaline Phosphatase: 69 U/L (ref 38–126)
BILIRUBIN TOTAL: 2.4 mg/dL — AB (ref 0.3–1.2)
BUN: 8 mg/dL (ref 6–20)
CHLORIDE: 106 mmol/L (ref 101–111)
CO2: 29 mmol/L (ref 22–32)
Calcium: 9.5 mg/dL (ref 8.9–10.3)
Creatinine, Ser: 0.7 mg/dL (ref 0.61–1.24)
GFR calc Af Amer: >60 mL/min (ref >60)
GFR calc non Af Amer: >60 mL/min (ref >60)
GLUCOSE: 77 mg/dL (ref 65–99)
POTASSIUM: 4.5 mmol/L (ref 3.5–5.1)
SODIUM: 141 mmol/L (ref 135–145)
TOTAL PROTEIN: 8.1 g/dL (ref 6.5–8.1)

## 2016-04-24 MED ORDER — LEVOFLOXACIN 750 MG PO TABS: 750.0000 mg | ORAL_TABLET | Freq: Every day | ORAL | 0 refills | Status: DC

## 2016-04-24 MED ORDER — LEVOFLOXACIN 750 MG PO TABS
750.0000 mg | ORAL_TABLET | Freq: Every day | ORAL | Status: DC
  Administered 2016-04-24: 750 mg via ORAL
  Filled 2016-04-24: qty 1

## 2016-04-24 NOTE — Discharge Instructions (Signed)
Community-Acquired Pneumonia, Adult Pneumonia is an infection of the lungs. There are different types of pneumonia. One type can develop while a person is in a hospital. A different type, called community-acquired pneumonia, develops in people who are not, or have not recently been, in the hospital or other health care facility. What are the causes? Pneumonia may be caused by bacteria, viruses, or funguses. Community-acquired pneumonia is often caused by Streptococcus pneumonia bacteria. These bacteria are often passed from one person to another by breathing in droplets from the cough or sneeze of an infected person. What increases the risk? The condition is more likely to develop in:  People who havechronic diseases, such as chronic obstructive pulmonary disease (COPD), asthma, congestive heart failure, cystic fibrosis, diabetes, or kidney disease.  People who haveearly-stage or late-stage HIV.  People who havesickle cell disease.  People who havehad their spleen removed (splenectomy).  People who havepoor Human resources officer.  People who havemedical conditions that increase the risk of breathing in (aspirating) secretions their own mouth and nose.  People who havea weakened immune system (immunocompromised).  People who smoke.  People whotravel to areas where pneumonia-causing germs commonly exist.  People whoare around animal habitats or animals that have pneumonia-causing germs, including birds, bats, rabbits, cats, and farm animals. What are the signs or symptoms? Symptoms of this condition include:  Adry cough.  A wet (productive) cough.  Fever.  Sweating.  Chest pain, especially when breathing deeply or coughing.  Rapid breathing or difficulty breathing.  Shortness of breath.  Shaking chills.  Fatigue.  Muscle aches. How is this diagnosed? Your health care provider will take a medical history and perform a physical exam. You may also have other tests,  including:  Imaging studies of your chest, including X-rays.  Tests to check your blood oxygen level and other blood gases.  Other tests on blood, mucus (sputum), fluid around your lungs (pleural fluid), and urine. If your pneumonia is severe, other tests may be done to identify the specific cause of your illness. How is this treated? The type of treatment that you receive depends on many factors, such as the cause of your pneumonia, the medicines you take, and other medical conditions that you have. For most adults, treatment and recovery from pneumonia may occur at home. In some cases, treatment must happen in a hospital. Treatment may include:  Antibiotic medicines, if the pneumonia was caused by bacteria.  Antiviral medicines, if the pneumonia was caused by a virus.  Medicines that are given by mouth or through an IV tube.  Oxygen.  Respiratory therapy. Although rare, treating severe pneumonia may include:  Mechanical ventilation. This is done if you are not breathing well on your own and you cannot maintain a safe blood oxygen level.  Thoracentesis. This procedureremoves fluid around one lung or both lungs to help you breathe better. Follow these instructions at home:  Take over-the-counter and prescription medicines only as told by your health care provider.  Only takecough medicine if you are losing sleep. Understand that cough medicine can prevent your bodys natural ability to remove mucus from your lungs.  If you were prescribed an antibiotic medicine, take it as told by your health care provider. Do not stop taking the antibiotic even if you start to feel better.  Sleep in a semi-upright position at night. Try sleeping in a reclining chair, or place a few pillows under your head.  Do not use tobacco products, including cigarettes, chewing tobacco, and e-cigarettes. If you  need help quitting, ask your health care provider.  Drink enough water to keep your urine clear  or pale yellow. This will help to thin out mucus secretions in your lungs. How is this prevented? There are ways that you can decrease your risk of developing community-acquired pneumonia. Consider getting a pneumococcal vaccine if:  You are older than 40 years of age.  You are older than 40 years of age and are undergoing cancer treatment, have chronic lung disease, or have other medical conditions that affect your immune system. Ask your health care provider if this applies to you. There are different types and schedules of pneumococcal vaccines. Ask your health care provider which vaccination option is best for you. You may also prevent community-acquired pneumonia if you take these actions:  Get an influenza vaccine every year. Ask your health care provider which type of influenza vaccine is best for you.  Go to the dentist on a regular basis.  Wash your hands often. Use hand sanitizer if soap and water are not available. Contact a health care provider if:  You have a fever.  You are losing sleep because you cannot control your cough with cough medicine. Get help right away if:  You have worsening shortness of breath.  You have increased chest pain.  Your sickness becomes worse, especially if you are an older adult or have a weakened immune system.  You cough up blood. This information is not intended to replace advice given to you by your health care provider. Make sure you discuss any questions you have with your health care provider. Document Released: 04/13/2005 Document Revised: 08/22/2015 Document Reviewed: 08/08/2014 Elsevier Interactive Patient Education  2017 [employer]  Sickle Cell Anemia, Adult Sickle cell anemia is a condition in which red blood cells have an abnormal sickle shape. This abnormal shape shortens the cells life span, which results in a lower than normal concentration of red blood cells in the blood. The sickle shape also causes the cells to clump  together and block free blood flow through the blood vessels. As a result, the tissues and organs of the body do not receive enough oxygen. Sickle cell anemia causes organ damage and pain and increases the risk of infection. What are the causes? Sickle cell anemia is a genetic disorder. Those who receive two copies of the gene have the condition, and those who receive one copy have the trait. What increases the risk? The sickle cell gene is most common in people whose families originated in [place]. Other areas of the globe where sickle cell trait occurs include the [place], [place], the [place], and the [place]. What are the signs or symptoms?  Pain, especially in the extremities, back, chest, or abdomen (common). The pain may start suddenly or may develop following an illness, especially if there is dehydration. Pain can also occur due to overexertion or exposure to extreme temperature changes.  Frequent severe bacterial infections, especially certain types of pneumonia and meningitis.  Pain and swelling in the hands and feet.  Decreased activity.  Loss of appetite.  Change in behavior.  Headaches.  Seizures.  Shortness of breath or difficulty breathing.  Vision changes.  Skin ulcers. Those with the trait may not have symptoms or they may have mild symptoms. How is this diagnosed? Sickle cell anemia is diagnosed with blood tests that demonstrate the genetic trait. It is often diagnosed during the newborn period, due to mandatory testing nationwide. A variety of blood tests,  X-rays, CT scans, MRI scans, ultrasounds, and lung function tests may also be done to monitor the condition. How is this treated? Sickle cell anemia may be treated with:  Medicines. You may be given pain medicines, antibiotic medicines (to treat and prevent infections) or medicines to increase the production of certain types of hemoglobin.  Fluids.  Oxygen.  Blood  transfusions. Follow these instructions at home:  Drink enough fluid to keep your urine clear or pale yellow. Increase your fluid intake in hot weather and during exercise.  Do not smoke. Smoking lowers oxygen levels in the blood.  Only take over-the-counter or prescription medicines for pain, fever, or discomfort as directed by your health care provider.  Take antibiotics as directed by your health care provider. Make sure you finish them it even if you start to feel better.  Take supplements as directed by your health care provider.  Consider wearing a medical alert bracelet. This tells anyone caring for you in an emergency of your condition.  When traveling, keep your medical information, health care provider's names, and the medicines you take with you at all times.  If you develop a fever, do not take medicines to reduce the fever right away. This could cover up a problem that is developing. Notify your health care provider.  Keep all follow-up appointments with your health care provider. Sickle cell anemia requires regular medical care. Contact a health care provider if: You have a fever. Get help right away if:  You feel dizzy or faint.  You have new abdominal pain, especially on the left side near the stomach area.  You develop a persistent, often uncomfortable and painful penile erection (priapism). If this is not treated immediately it will lead to impotence.  You have numbness your arms or legs or you have a hard time moving them.  You have a hard time with speech.  You have a fever or persistent symptoms for more than 2-3 days.  You have a fever and your symptoms suddenly get worse.  You have signs or symptoms of infection. These include:  Chills.  Abnormal tiredness (lethargy).  Irritability.  Poor eating.  Vomiting.  You develop pain that is not helped with medicine.  You develop shortness of breath.  You have pain in your chest.  You are coughing  up pus-like or bloody sputum.  You develop a stiff neck.  Your feet or hands swell or have pain.  Your abdomen appears bloated.  You develop joint pain. This information is not intended to replace advice given to you by your health care provider. Make sure you discuss any questions you have with your health care provider. Document Released: 07/22/2005 Document Revised: 11/01/2015 Document Reviewed: 11/23/2012 Elsevier Interactive Patient Education  2017 [employer]

## 2016-04-24 NOTE — Progress Notes (Signed)
PHARMACIST - PHYSICIAN COMMUNICATION DR:   Hyman HopesJegede CONCERNING: Antibiotic IV to Oral Route Change Policy  RECOMMENDATION: This patient is receiving Levaquin by the intravenous route.  Based on criteria approved by the Pharmacy and Therapeutics Committee, the antibiotic(s) is/are being converted to the equivalent oral dose form(s).   DESCRIPTION: These criteria include:  Patient being treated for a respiratory tract infection, urinary tract infection, cellulitis or clostridium difficile associated diarrhea if on metronidazole  The patient is not neutropenic and does not exhibit a GI malabsorption state  The patient is eating (either orally or via tube) and/or has been taking other orally administered medications for a least 24 hours  The patient is improving clinically and has a Tmax < 100.5  If you have questions about this conversion, please contact the Pharmacy Department  []   [phone]( [phone] )  [facility] []   [phone]( [phone] )  [facility] []   [phone]( [phone] )  [facility] []   [phone]( [phone] )  [facility] [x]   [phone]( [phone] )  [facility]  Hessie KnowsJustin M Nyella Eckels, PharmD, [place] Pager [phone] 04/24/2016 8:26 AM

## 2016-04-24 NOTE — Discharge Summary (Signed)
Physician Discharge Summary  Malik PortHoward E Dupras ZOX:[id number] DOB: 12-27-1975 DOA: 04/21/2016  PCP: Dorrene GermanEdwin A Avbuere, MD  Admit date: 04/21/2016  Discharge date: 04/24/2016  Discharge Diagnoses:  Active Problems:   HCAP (healthcare-associated pneumonia)  Discharge Condition: Stable Disposition:  Follow-up Information    Dorrene GermanEdwin A Avbuere, MD Follow up in 1 week(s).   Specialty:  Internal Medicine Contact information: [address] [place] [address] [phone]          Diet: Regular Wt Readings from Last 3 Encounters:  04/21/16 79.4 kg (175 lb)  03/25/16 81.3 kg (179 lb 3.2 oz)  02/16/16 79.1 kg (174 lb 6.1 oz)   History of present illness:  [name] is a 40 y.o. 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  He has had 7 admissions over the last year.  The patient states that approximately 2 days ago he developed 10 out of 10 pain located along the right lateral chest wall which radiates up to his right shoulder and right scapula. The pain is worse with deep inspiration and he has had a dry cough. He has had some sweats the last 2 nights but denies known fevers or chills. The pain has been constant. He continued to take his basal morphine ES 60 mg once daily and increased his immediate release morphine from 30 mg once a day to 30 mg twice a day. He did not attempt to take any NSAIDs. His pain persisted and he had some mild shortness of breath today so he presented to the emergency department for evaluation. He denies pain in his calves or swelling of his legs. He has never had a DVT or pulmonary embolism. He denies history of heart attack. He states he is trying to get into [facility] for ongoing sickle cell maintenance  ED Course:  Afebrile, pulse in the 70s to 90s, respirations 16-18, blood pressure stable, 93-97 percent on room air. White blood cell count 14.1 (baseline 11-15), hemoglobin  11 (baseline 9-11), bilirubin 2.6 (baseline 2-6), retic 1.3 (baseline 2.5-5). Chest x-ray demonstrated a new right lower lobe pneumonia.  EKG with some new T-wave inversions in the inferior leads. I-STAT troponin is pending.  I added a d-dimer as well.  Hospital Course:  Patient was admitted for Acute Sickle Cell Pain Crisis and HCAP Vs ACS. He was placed on Telemetry and managed with standard sickle cell crisis protocol and broad spectrum antibiotics including Vancomycin and Cefepime. Patient did well on admission, no desaturation noticed, no chest pain, patient had no cough. He improved significantly with management, pain went down slowly to 3/10, no documented fever during this admission, Hb remained stable throughout. On the day of discharge, patient reported pain at baseline, antibiotics has been narrowed to Levaquin, ambulating and eating well without restriction, patient was discharged home on oral Levaquin for 5 more days, to continue home medications and follow up with PCP within 1 week of discharge. His father was present at bedside, verbalized understanding. He was hemodynamically stable.  Discharge Exam: Vitals:   04/24/16 0548 04/24/16 0751  BP: 137/89   Pulse: 83   Resp: 18 13  Temp: 97.5 F (36.4 C)    Vitals:   04/24/16 0100 04/24/16 0413 04/24/16 0548 04/24/16 0751  BP:   137/89   Pulse:   83   Resp: 18 11 18 13   Temp:   97.5 F (36.4 C)   TempSrc:  Oral   SpO2: 97% 93% 98% 100%  Weight:      Height:       General appearance : Awake, alert, not in any distress. Speech Clear. Not toxic looking HEENT: Atraumatic and Normocephalic, pupils equally reactive to light and accomodation Neck: Supple, no JVD. No cervical lymphadenopathy.  Chest: Good air entry bilaterally, no added sounds  CVS: S1 S2 regular, no murmurs.  Abdomen: Bowel sounds present, Non tender and not distended with no gaurding, rigidity or rebound. Extremities: B/L Lower Ext shows no edema, both legs are  warm to touch Neurology: Awake alert, and oriented X 3, CN II-XII intact, Non focal Skin: No Rash Discharge Instructions  Allergies as of 04/24/2016   No Known Allergies     Medication List    TAKE these medications   cyclobenzaprine 10 MG tablet Commonly known as:  FLEXERIL Take 10 mg by mouth at bedtime.   folic acid 1 MG tablet Commonly known as:  FOLVITE Take 1 mg by mouth every evening.   HYDROmorphone 2 MG tablet Commonly known as:  DILAUDID Take 1 tablet (2 mg total) by mouth every 6 (six) hours as needed for severe pain. Resume use of Morphine Sulfate after completion of this medication   levofloxacin 750 MG tablet Commonly known as:  LEVAQUIN Take 1 tablet (750 mg total) by mouth daily. Start taking on:  04/25/2016   morphine 60 MG 12 hr tablet Commonly known as:  MS CONTIN Take 1 tablet (60 mg total) by mouth 2 (two) times daily.   morphine 30 MG tablet Commonly known as:  MSIR Take 1 tablet (30 mg total) by mouth daily as needed for severe pain (pain). Resume after completing dilaudid.   promethazine 25 MG tablet Commonly known as:  PHENERGAN Take 25 mg by mouth every 6 (six) hours as needed for nausea (nausea).      The results of significant diagnostics from this hospitalization (including imaging, microbiology, ancillary and laboratory) are listed below for reference.    Significant Diagnostic Studies: Dg Chest 2 View  Result Date: 04/24/2016 CLINICAL DATA:  Follow-up pneumonia EXAM: CHEST  2 VIEW COMPARISON:  04/22/2016 FINDINGS: Persistent streaky residual infiltrate or scarring in right middle lobe and right infrahilar region. No pulmonary edema. There is new linear atelectasis or infiltrate left base. Follow-up to resolution is recommended. IMPRESSION: Persistent streaky residual infiltrate or scarring in right middle lobe and right infrahilar region. No pulmonary edema. There is new linear atelectasis or infiltrate left base. Follow-up to resolution  is recommended. Electronically Signed   By: Natasha Mead M.D.   On: 04/24/2016 09:23   Dg Chest 2 View  Result Date: 04/22/2016 CLINICAL DATA:  Healthcare associated pneumonia EXAM: CHEST  2 VIEW COMPARISON:  04/21/2016 FINDINGS: Cardiomediastinal silhouette is stable. Persistent streaky infiltrate/pneumonia right lower lobe posterior medially. Follow-up to resolution is recommended. IMPRESSION: Persistent streaky infiltrate/ pneumonia in right lower lobe posterior medially. Follow-up to resolution is recommended. Electronically Signed   By: Natasha Mead M.D.   On: 04/22/2016 08:40   Dg Chest 2 View  Result Date: 04/21/2016 CLINICAL DATA:  Shortness of breath.  Sickle cell disease. EXAM: CHEST  2 VIEW COMPARISON:  03/22/2016 FINDINGS: The heart size and mediastinal contours are within normal limits. Right lung scarring again demonstrated. New opacity seen in the medial right middle lobe, suspicious for pneumonia. No evidence pleural effusion. Left lung is clear. IMPRESSION: New opacity in medial right lower lobe, suspicious for pneumonia. Recommend clinical correlation;  consider followup PA and lateral chest X-ray in 3-4 weeks following trial of antibiotic therapy to ensure resolution. Electronically Signed   By: Myles RosenthalJohn  Stahl M.D.   On: 04/21/2016 12:14   Ct Angio Chest Pe W Or Wo Contrast  Result Date: 04/21/2016 CLINICAL DATA:  Elevated D-dimer.  Chest pain. EXAM: CT ANGIOGRAPHY CHEST WITH CONTRAST TECHNIQUE: Multidetector CT imaging of the chest was performed using the standard protocol during bolus administration of intravenous contrast. Multiplanar CT image reconstructions and MIPs were obtained to evaluate the vascular anatomy. CONTRAST:  100 mL Isovue 370 COMPARISON:  None. FINDINGS: Cardiovascular: Satisfactory opacification of the pulmonary arteries to the segmental level. No evidence of pulmonary embolism. Normal heart size. No pericardial effusion. Mediastinum/Nodes: No enlarged mediastinal,  hilar, or axillary lymph nodes. Thyroid gland, trachea, and esophagus demonstrate no significant findings. Lungs/Pleura: Right lower lobe airspace disease. Trace right pleural effusion. Scattered subpleural cystic areas bilaterally. Upper Abdomen: No acute abnormality. Musculoskeletal: No acute osseous abnormality. Patchy sclerosis throughout the osseous structures consistent with history of sickle cell disease. Review of the MIP images confirms the above findings. IMPRESSION: 1. No evidence of pulmonary embolus. 2. Right lower lobe pneumonia. Followup PA and lateral chest X-ray is recommended in 3-4 weeks following trial of antibiotic therapy to ensure resolution and exclude underlying malignancy. Electronically Signed   By: Elige KoHetal  Patel   On: 04/21/2016 18:23    Microbiology: Recent Results (from the past 240 hour(s))  MRSA PCR Screening     Status: None   Collection Time: 04/21/16  9:20 PM  Result Value Ref Range Status   MRSA by PCR NEGATIVE NEGATIVE Final    Comment:        The GeneXpert MRSA Assay (FDA approved for NASAL specimens only), is one component of a comprehensive MRSA colonization surveillance program. It is not intended to diagnose MRSA infection nor to guide or monitor treatment for MRSA infections.     Labs: Basic Metabolic Panel:  Recent Labs Lab 04/21/16 1330 04/22/16 0514 04/23/16 0517 04/24/16 0527  NA 135 139 141 141  K 4.6 4.1 4.1 4.5  CL 105 103 105 106  CO2 26 27 28 29   GLUCOSE 96 91 90 77  BUN 13 12 7 8   CREATININE 0.84 0.75 0.69 0.70  CALCIUM 9.2 9.1 9.0 9.5   Liver Function Tests:  Recent Labs Lab 04/21/16 1330 04/22/16 0514 04/23/16 0517 04/24/16 0527  AST 30 19 16 19   ALT 10* 8* 7* 9*  ALKPHOS 75 71 65 69  BILITOT 2.6* 2.3* 1.7* 2.4*  PROT 8.7* 8.4* 7.4 8.1  ALBUMIN 5.1* 4.5 4.2 4.6   No results for input(s): LIPASE, AMYLASE in the last 168 hours. No results for input(s): AMMONIA in the last 168 hours. CBC:  Recent Labs Lab  04/21/16 1330 04/22/16 0514 04/23/16 0517  WBC 14.1* 10.0 9.6  NEUTROABS 10.0* 5.0 4.3  HGB 11.0* 10.5* 9.0*  HCT 29.4* 28.8* 25.3*  MCV 87.2 87.3 85.5  PLT 308 269 244   Cardiac Enzymes:  Recent Labs Lab 04/21/16 1936 04/21/16 2324 04/22/16 0514  TROPONINI <0.03 <0.03 <0.03   BNP: Invalid input(s): POCBNP CBG: No results for input(s): GLUCAP in the last 168 hours.  Time coordinating discharge: 50 minutes  Signed:  Jeanann LewandowskyJEGEDE, Cheng Dec  [facility] 04/24/2016, 12:02 PM

## 2016-08-13 ENCOUNTER — Inpatient Hospital Stay (HOSPITAL_COMMUNITY)
Admission: EM | Admit: 2016-08-13 | Discharge: 2016-08-17 | DRG: 812 | Disposition: A | Discharge status: Home / Self Care | Payer: Medicare Other | Attending: Internal Medicine | Admitting: Internal Medicine

## 2016-08-13 ENCOUNTER — Encounter (HOSPITAL_COMMUNITY): Payer: Self-pay | Admitting: *Deleted

## 2016-08-13 DIAGNOSIS — Z79899 Other long term (current) drug therapy: Secondary | ICD-10-CM | POA: Diagnosis not present

## 2016-08-13 DIAGNOSIS — L7632 Postprocedural hematoma of skin and subcutaneous tissue following other procedure: Secondary | ICD-10-CM | POA: Diagnosis present

## 2016-08-13 DIAGNOSIS — Z96641 Presence of right artificial hip joint: Secondary | ICD-10-CM | POA: Diagnosis present

## 2016-08-13 DIAGNOSIS — T45515A Adverse effect of anticoagulants, initial encounter: Secondary | ICD-10-CM | POA: Diagnosis present

## 2016-08-13 DIAGNOSIS — D5700 Hb-SS disease with crisis, unspecified: Secondary | ICD-10-CM | POA: Diagnosis present

## 2016-08-13 DIAGNOSIS — G8929 Other chronic pain: Secondary | ICD-10-CM | POA: Diagnosis present

## 2016-08-13 DIAGNOSIS — D638 Anemia in other chronic diseases classified elsewhere: Secondary | ICD-10-CM | POA: Diagnosis present

## 2016-08-13 DIAGNOSIS — D57219 Sickle-cell/Hb-C disease with crisis, unspecified: Secondary | ICD-10-CM | POA: Diagnosis present

## 2016-08-13 DIAGNOSIS — F17210 Nicotine dependence, cigarettes, uncomplicated: Secondary | ICD-10-CM | POA: Diagnosis present

## 2016-08-13 DIAGNOSIS — Z79891 Long term (current) use of opiate analgesic: Secondary | ICD-10-CM | POA: Diagnosis not present

## 2016-08-13 DIAGNOSIS — K5900 Constipation, unspecified: Secondary | ICD-10-CM | POA: Diagnosis not present

## 2016-08-13 DIAGNOSIS — D5720 Sickle-cell/Hb-C disease without crisis: Secondary | ICD-10-CM | POA: Diagnosis present

## 2016-08-13 DIAGNOSIS — G894 Chronic pain syndrome: Secondary | ICD-10-CM | POA: Diagnosis not present

## 2016-08-13 DIAGNOSIS — S301XXA Contusion of abdominal wall, initial encounter: Secondary | ICD-10-CM | POA: Diagnosis not present

## 2016-08-13 LAB — CBC WITH DIFFERENTIAL/PLATELET
Basophils Absolute: 0.1 10*3/uL (ref 0.0–0.1)
Basophils Relative: 1 %
EOS ABS: 0.6 10*3/uL (ref 0.0–0.7)
Eosinophils Relative: 5 %
HCT: 28 % — ABNORMAL LOW (ref 39.0–52.0)
Hemoglobin: 10 g/dL — ABNORMAL LOW (ref 13.0–17.0)
LYMPHS ABS: 1.9 10*3/uL (ref 0.7–4.0)
Lymphocytes Relative: 18 %
MCH: 31.3 pg (ref 26.0–34.0)
MCHC: 35.7 g/dL (ref 30.0–36.0)
MCV: 87.5 fL (ref 78.0–100.0)
MONO ABS: 0.8 10*3/uL (ref 0.1–1.0)
MONOS PCT: 8 %
NEUTROS ABS: 6.8 10*3/uL (ref 1.7–7.7)
NEUTROS PCT: 67 %
PLATELETS: 325 10*3/uL (ref 150–400)
RBC: 3.2 MIL/uL — ABNORMAL LOW (ref 4.22–5.81)
RDW: 14 % (ref 11.5–15.5)
WBC: 10.1 10*3/uL (ref 4.0–10.5)

## 2016-08-13 LAB — RETICULOCYTES
RBC.: 3.2 MIL/uL — ABNORMAL LOW (ref 4.22–5.81)
RETIC COUNT ABSOLUTE: 96 10*3/uL (ref 19.0–186.0)
RETIC CT PCT: 3 % (ref 0.4–3.1)

## 2016-08-13 LAB — COMPREHENSIVE METABOLIC PANEL
ALT: 13 U/L — ABNORMAL LOW (ref 17–63)
ANION GAP: 7 (ref 5–15)
AST: 25 U/L (ref 15–41)
Albumin: 4.6 g/dL (ref 3.5–5.0)
Alkaline Phosphatase: 86 U/L (ref 38–126)
BILIRUBIN TOTAL: 2.2 mg/dL — AB (ref 0.3–1.2)
BUN: 15 mg/dL (ref 6–20)
CHLORIDE: 106 mmol/L (ref 101–111)
CO2: 25 mmol/L (ref 22–32)
CREATININE: 0.83 mg/dL (ref 0.61–1.24)
Calcium: 9 mg/dL (ref 8.9–10.3)
GFR calc Af Amer: >60 mL/min (ref >60)
Glucose, Bld: 84 mg/dL (ref 65–99)
POTASSIUM: 3.9 mmol/L (ref 3.5–5.1)
Sodium: 138 mmol/L (ref 135–145)
Total Protein: 8.2 g/dL — ABNORMAL HIGH (ref 6.5–8.1)

## 2016-08-13 MED ORDER — MORPHINE SULFATE ER 30 MG PO TBCR
60.0000 mg | EXTENDED_RELEASE_TABLET | Freq: Two times a day (BID) | ORAL | Status: DC
  Administered 2016-08-13 – 2016-08-17 (×8): 60 mg via ORAL
  Filled 2016-08-13 (×8): qty 2

## 2016-08-13 MED ORDER — DIPHENHYDRAMINE HCL 25 MG PO CAPS
25.0000 mg | ORAL_CAPSULE | ORAL | Status: DC | PRN
Start: 2016-08-13 — End: 2016-08-17

## 2016-08-13 MED ORDER — DIPHENHYDRAMINE HCL 50 MG/ML IJ SOLN: 12.5000 mg | Freq: Four times a day (QID) | INTRAMUSCULAR | Status: DC | PRN

## 2016-08-13 MED ORDER — ACETAMINOPHEN 325 MG PO TABS: 650.0000 mg | ORAL_TABLET | Freq: Four times a day (QID) | ORAL | Status: DC | PRN

## 2016-08-13 MED ORDER — CYCLOBENZAPRINE HCL 10 MG PO TABS
10.0000 mg | ORAL_TABLET | Freq: Every day | ORAL | Status: DC
  Administered 2016-08-13 – 2016-08-16 (×4): 10 mg via ORAL
  Filled 2016-08-13 (×4): qty 1

## 2016-08-13 MED ORDER — ONDANSETRON HCL 4 MG/2ML IJ SOLN: 4.0000 mg | Freq: Four times a day (QID) | INTRAMUSCULAR | Status: DC | PRN

## 2016-08-13 MED ORDER — SODIUM CHLORIDE 0.45 % IV SOLN
INTRAVENOUS | Status: DC
  Administered 2016-08-13: 12:00:00 via INTRAVENOUS

## 2016-08-13 MED ORDER — ONDANSETRON HCL 4 MG/2ML IJ SOLN
4.0000 mg | INTRAMUSCULAR | Status: DC | PRN
  Administered 2016-08-13: 4 mg via INTRAVENOUS
  Filled 2016-08-13: qty 2

## 2016-08-13 MED ORDER — DIPHENHYDRAMINE HCL 50 MG/ML IJ SOLN
25.0000 mg | Freq: Once | INTRAMUSCULAR | Status: AC
  Administered 2016-08-13: 25 mg via INTRAVENOUS
  Filled 2016-08-13: qty 1

## 2016-08-13 MED ORDER — HYDROMORPHONE 1 MG/ML IV SOLN
INTRAVENOUS | Status: DC
  Administered 2016-08-13: 1.2 mg via INTRAVENOUS
  Administered 2016-08-13: 19:00:00 via INTRAVENOUS
  Filled 2016-08-13: qty 25

## 2016-08-13 MED ORDER — KETOROLAC TROMETHAMINE 30 MG/ML IJ SOLN
30.0000 mg | Freq: Four times a day (QID) | INTRAMUSCULAR | Status: DC
  Administered 2016-08-13 – 2016-08-17 (×16): 30 mg via INTRAVENOUS
  Filled 2016-08-13 (×16): qty 1

## 2016-08-13 MED ORDER — SODIUM CHLORIDE 0.9% FLUSH: 9.0000 mL | INTRAVENOUS | Status: DC | PRN

## 2016-08-13 MED ORDER — HYDROMORPHONE 1 MG/ML IV SOLN
INTRAVENOUS | Status: DC
  Administered 2016-08-14: 4.5 mg via INTRAVENOUS
  Administered 2016-08-14: 12.7 mg via INTRAVENOUS
  Administered 2016-08-14: 08:00:00 via INTRAVENOUS
  Filled 2016-08-13: qty 25

## 2016-08-13 MED ORDER — HYDROMORPHONE HCL 2 MG/ML IJ SOLN
2.0000 mg | INTRAMUSCULAR | Status: AC
  Administered 2016-08-13: 2 mg via INTRAVENOUS
  Filled 2016-08-13: qty 1

## 2016-08-13 MED ORDER — HYDROMORPHONE HCL 2 MG/ML IJ SOLN: 2.0000 mg | INTRAMUSCULAR | Status: AC

## 2016-08-13 MED ORDER — DIPHENHYDRAMINE HCL 12.5 MG/5ML PO ELIX: 12.5000 mg | ORAL_SOLUTION | Freq: Four times a day (QID) | ORAL | Status: DC | PRN

## 2016-08-13 MED ORDER — ACETAMINOPHEN 650 MG RE SUPP: 650.0000 mg | Freq: Four times a day (QID) | RECTAL | Status: DC | PRN

## 2016-08-13 MED ORDER — SODIUM CHLORIDE 0.9 % IV SOLN
INTRAVENOUS | Status: DC
  Administered 2016-08-13 – 2016-08-15 (×6): via INTRAVENOUS

## 2016-08-13 MED ORDER — POLYETHYLENE GLYCOL 3350 17 G PO PACK
17.0000 g | PACK | Freq: Every day | ORAL | Status: DC | PRN
  Administered 2016-08-14 – 2016-08-15 (×2): 17 g via ORAL
  Filled 2016-08-13 (×2): qty 1

## 2016-08-13 MED ORDER — DIPHENHYDRAMINE HCL 50 MG/ML IJ SOLN
25.0000 mg | INTRAMUSCULAR | Status: DC | PRN
  Filled 2016-08-13: qty 0.5

## 2016-08-13 MED ORDER — HYDROMORPHONE 1 MG/ML IV SOLN: INTRAVENOUS | Status: DC

## 2016-08-13 MED ORDER — KETOROLAC TROMETHAMINE 30 MG/ML IJ SOLN
30.0000 mg | INTRAMUSCULAR | Status: AC
  Administered 2016-08-13: 30 mg via INTRAVENOUS
  Filled 2016-08-13: qty 1

## 2016-08-13 MED ORDER — NALOXONE HCL 0.4 MG/ML IJ SOLN: 0.4000 mg | INTRAMUSCULAR | Status: DC | PRN

## 2016-08-13 MED ORDER — BISACODYL 10 MG RE SUPP: 10.0000 mg | Freq: Every day | RECTAL | Status: DC | PRN

## 2016-08-13 MED ORDER — ENOXAPARIN SODIUM 40 MG/0.4ML ~~LOC~~ SOLN
40.0000 mg | SUBCUTANEOUS | Status: DC
  Administered 2016-08-13 – 2016-08-14 (×2): 40 mg via SUBCUTANEOUS
  Filled 2016-08-13 (×3): qty 0.4

## 2016-08-13 MED ORDER — ONDANSETRON HCL 4 MG PO TABS: 4.0000 mg | ORAL_TABLET | Freq: Four times a day (QID) | ORAL | Status: DC | PRN

## 2016-08-13 MED ORDER — HYDROMORPHONE HCL 2 MG/ML IJ SOLN: 2.0000 mg | INTRAMUSCULAR | Status: DC

## 2016-08-13 MED ORDER — HYDROMORPHONE HCL 2 MG/ML IJ SOLN
2.0000 mg | INTRAMUSCULAR | Status: DC
  Administered 2016-08-13 (×3): 2 mg via INTRAVENOUS
  Filled 2016-08-13 (×4): qty 1

## 2016-08-13 MED ORDER — FOLIC ACID 1 MG PO TABS
1.0000 mg | ORAL_TABLET | Freq: Every evening | ORAL | Status: DC
  Administered 2016-08-13 – 2016-08-16 (×4): 1 mg via ORAL
  Filled 2016-08-13 (×4): qty 1

## 2016-08-13 MED ORDER — PROMETHAZINE HCL 25 MG PO TABS
25.0000 mg | ORAL_TABLET | Freq: Four times a day (QID) | ORAL | Status: DC | PRN
  Administered 2016-08-13 – 2016-08-17 (×6): 25 mg via ORAL
  Filled 2016-08-13 (×6): qty 1

## 2016-08-13 MED ORDER — ONDANSETRON HCL 4 MG/2ML IJ SOLN
4.0000 mg | Freq: Four times a day (QID) | INTRAMUSCULAR | Status: DC | PRN
  Administered 2016-08-14 – 2016-08-17 (×11): 4 mg via INTRAVENOUS
  Filled 2016-08-13 (×11): qty 2

## 2016-08-13 NOTE — ED Provider Notes (Signed)
[facility]-EMERGENCY DEPT Provider Note   CSN: [id number] Arrival date & time: 08/13/16  1058     History   Chief Complaint Chief Complaint  Patient presents with  . Sickle Cell Pain Crisis    HPI CAIDYN HENRICKSEN is a 41 y.o. male.  The history is provided by the patient. No language interpreter was used.  Sickle Cell Pain Crisis  Location:  Back Severity:  Severe Onset quality:  Gradual Duration:  1 week Similar to previous crisis episodes: yes   Timing:  Constant Chronicity:  New Sickle cell genotype:  [place] Frequency of attacks:  No Context: not alcohol consumption   Relieved by:  Nothing Worsened by:  Nothing Ineffective treatments:  Prescription drugs Associated symptoms: no chest pain and no nausea   Risk factors: no smoking   Pt complains of a sickle cell crisis.  Pt reports pain in his back not relieved by MS contin.    Past Medical History:  Diagnosis Date  . Avascular necrosis of hip (HCC)    bilateral  . Avascular necrosis of hip, left (HCC) 08/27/2011  . Blood transfusion   . Infection of bone, shoulder region (HCC)    left shoulder  . Pneumonia   . Sickle cell crisis [facility])     Patient Active Problem List   Diagnosis Date Noted  . Elevated d-dimer   . HCAP (healthcare-associated pneumonia) 04/21/2016  . Sickle cell anemia (HCC) 08/13/2015  . Pleuritic chest pain 02/08/2015  . Sickle cell disease, type [place] (HCC) 10/18/2012  . Sickle cell pain crisis (HCC) 10/13/2012  . History of tobacco abuse 10/13/2012  . Anemia 11/08/2011  . Avascular necrosis of hip, left (HCC) 08/27/2011  . Tobacco abuse 05/15/2011  . Sickle cell anemia with crisis (HCC) 05/13/2011    Past Surgical History:  Procedure Laterality Date  . BONE GRAFT HIP ILIAC CREST    . JOINT REPLACEMENT  2006   right total hip arthroplasty  . Orif right hip fracture  1995       Home Medications    Prior to Admission medications   Medication Sig Start Date End Date Taking? Authorizing  Provider  cyclobenzaprine (FLEXERIL) 10 MG tablet Take 10 mg by mouth at bedtime.   Yes Historical Provider, MD  folic acid (FOLVITE) 1 MG tablet Take 1 mg by mouth every evening.    Yes Historical Provider, MD  morphine (MS CONTIN) 60 MG 12 hr tablet Take 1 tablet (60 mg total) by mouth 2 (two) times daily. 11/20/11  Yes Grayce Sessions, NP  morphine (MSIR) 30 MG tablet Take 1 tablet (30 mg total) by mouth daily as needed for severe pain (pain). Resume after completing dilaudid. 02/17/16  Yes Altha Harm, MD  promethazine (PHENERGAN) 25 MG tablet Take 25 mg by mouth every 6 (six) hours as needed for nausea (nausea).    Yes Historical Provider, MD  HYDROmorphone (DILAUDID) 2 MG tablet Take 1 tablet (2 mg total) by mouth every 6 (six) hours as needed for severe pain. Resume use of Morphine Sulfate after completion of this medication Patient not taking: Reported on 04/21/2016 02/17/16   Altha Harm, MD  levofloxacin (LEVAQUIN) 750 MG tablet Take 1 tablet (750 mg total) by mouth daily. Patient not taking: Reported on 08/13/2016 04/25/16   Quentin Angst, MD  morphine (MS CONTIN) 60 MG 12 hr tablet Take 60 mg by mouth 2 (two) times daily. 08/07/16   Historical Provider, MD    Family History  Family History  Problem Relation Age of Onset  . Adopted: Yes    Social History Social History  Substance Use Topics  . Smoking status: Current Some Day Smoker    Packs/day: 0.50    Types: Cigarettes    Last attempt to quit: 01/27/2012  . Smokeless tobacco: Never Used  . Alcohol use No     Allergies   Patient has no known allergies.   Review of Systems Review of Systems  Cardiovascular: Negative for chest pain.  Gastrointestinal: Negative for nausea.  All other systems reviewed and are negative.    Physical Exam Updated Vital Signs BP 113/81   Pulse 77   Temp 97.6 F (36.4 C) (Oral)   Resp 16   Ht  (1.88 m)   Wt 77.1 kg   SpO2 97%   BMI 21.83 kg/m    Physical Exam  Constitutional: He appears well-developed and well-nourished.  HENT:  Head: Normocephalic and atraumatic.  Eyes: Conjunctivae are normal.  Neck: Neck supple.  Cardiovascular: Normal rate and regular rhythm.   No murmur heard. Pulmonary/Chest: Effort normal and breath sounds normal. No respiratory distress.  Abdominal: Soft. There is no tenderness.  Musculoskeletal: He exhibits no edema.  Neurological: He is alert.  Skin: Skin is warm and dry.  Psychiatric: He has a normal mood and affect.  Nursing note and vitals reviewed.    ED Treatments / Results  Labs (all labs ordered are listed, but only abnormal results are displayed) Labs Reviewed  COMPREHENSIVE METABOLIC PANEL  CBC WITH DIFFERENTIAL/PLATELET  RETICULOCYTES    EKG  EKG Interpretation None       Radiology No results found.  Procedures Procedures (including critical care time)  Medications Ordered in ED Medications  ketorolac (TORADOL) 30 MG/ML injection 30 mg (not administered)  0.45 % sodium chloride infusion (not administered)  diphenhydrAMINE (BENADRYL) injection 25 mg (not administered)  ondansetron (ZOFRAN) injection 4 mg (not administered)  HYDROmorphone (DILAUDID) injection 2 mg (not administered)    Or  HYDROmorphone (DILAUDID) injection 2 mg (not administered)  HYDROmorphone (DILAUDID) injection 2 mg (not administered)    Or  HYDROmorphone (DILAUDID) injection 2 mg (not administered)  HYDROmorphone (DILAUDID) injection 2 mg (not administered)    Or  HYDROmorphone (DILAUDID) injection 2 mg (not administered)  HYDROmorphone (DILAUDID) injection 2 mg (not administered)    Or  HYDROmorphone (DILAUDID) injection 2 mg (not administered)     Initial Impression / Assessment and Plan / ED Course  I have reviewed the triage vital signs and the nursing notes.  Pertinent labs & imaging results that were available during my care of the patient were reviewed by me and considered in  my medical decision making (see chart for details).     Pt given Iv fluids, torodol, benadryl, zofran and 4 dosages of Iv dilaudid.  Pt reports no change in pain.  Pt reports he normally has to be in the hospital when symptoms are this bad.  Final Clinical Impressions(s) / ED Diagnoses   Final diagnoses:  Sickle cell crisis (HCC)    New Prescriptions New Prescriptions   No medications on file  I spoke with Dr. Butler Denmark hospitalist who will see for admission   Elson Areas, PA-C 08/13/16 1702    Lavera Guise, MD 08/13/16 2026

## 2016-08-13 NOTE — ED Triage Notes (Signed)
Pt c/o right lateral rib and back pain onset a few days ago, pt reports symptoms consistent with typical sickle cell crisis pain. No SOB or CP.

## 2016-08-13 NOTE — H&P (Signed)
History and Physical    [name] ZOX:[id number] DOB: Jun 21, 1975 DOA: 08/13/2016    PCP: Dorrene German, MD  Patient coming from: home  Chief Complaint: back pain  HPI: [name] is a 41 y.o. male with medical history of Sickle cell disease, Avascular necrosis of hip s/p replacement, acute chest syndrome who developed right lateral rib cage and right back pain 2 days ago. He has been taking his MS Contin and short acting Morphine without improvement in pain. No dyspnea, cough, fever, sore throat. No other complaints.   ED Course:  Given 10 mg Dilaudid IV in past 5 hrs along with Toradol 30 mg and IVF  Review of Systems:  All other systems reviewed and apart from HPI, are negative.  Past Medical History:  Diagnosis Date  . Avascular necrosis of hip (HCC)    bilateral  . Avascular necrosis of hip, left (HCC) 08/27/2011  . Blood transfusion   . Infection of bone, shoulder region (HCC)    left shoulder  . Pneumonia   . Sickle cell crisis [facility])     Past Surgical History:  Procedure Laterality Date  . BONE GRAFT HIP ILIAC CREST    . JOINT REPLACEMENT  2006   right total hip arthroplasty  . Orif right hip fracture  1995    Social History:  Reports that he has been smoking Cigarettes- 2-3 a day.  He has never used smokeless tobacco. He reports that he does not drink alcohol or use drugs.  No Known Allergies  Family History  Problem Relation Age of Onset  . Adopted: Yes     Prior to Admission medications   Medication Sig Start Date End Date Taking? Authorizing Provider  cyclobenzaprine (FLEXERIL) 10 MG tablet Take 10 mg by mouth at bedtime.   Yes Historical Provider, MD  folic acid (FOLVITE) 1 MG tablet Take 1 mg by mouth every evening.    Yes Historical Provider, MD  morphine (MS CONTIN) 60 MG 12 hr tablet Take 1 tablet (60 mg total) by mouth 2 (two) times daily. 11/20/11  Yes Grayce Sessions, NP  morphine (MSIR) 30 MG tablet Take 1 tablet (30 mg total) by  mouth daily as needed for severe pain (pain). Resume after completing dilaudid. 02/17/16  Yes Altha Harm, MD  promethazine (PHENERGAN) 25 MG tablet Take 25 mg by mouth every 6 (six) hours as needed for nausea (nausea).    Yes Historical Provider, MD  morphine (MS CONTIN) 60 MG 12 hr tablet Take 60 mg by mouth 2 (two) times daily. 08/07/16   Historical Provider, MD    Physical Exam: Wt Readings from Last 3 Encounters:  08/13/16 77.1 kg (170 lb)  04/21/16 79.4 kg (175 lb)  03/25/16 81.3 kg (179 lb 3.2 oz)   Vitals:   08/13/16 1400 08/13/16 1402 08/13/16 1500 08/13/16 1645  BP: 113/71 113/71 (!) 149/49   Pulse: (!) 53 62 65 70  Resp: Temp:      TempSrc:      SpO2: 94% 95% 93% 95%  Weight:      Height:          Constitutional: NAD, calm, comfortable Eyes: PERTLA, lids and conjunctivae normal ENMT: Mucous membranes are moist. Posterior pharynx clear of any exudate or lesions. Normal dentition.  Neck: normal, supple, no masses, no thyromegaly Respiratory: clear to auscultation bilaterally, no wheezing, no crackles. Normal respiratory effort. No accessory muscle use.  Cardiovascular: S1 &  S2 heard, regular rate and rhythm, no murmurs / rubs / gallops. No extremity edema. 2+ pedal pulses. No carotid bruits.  Chest wall: tender along the right side of the rib cage near the back and on right back Abdomen: No distension, no tenderness, no masses palpated. No hepatosplenomegaly. Bowel sounds normal.  Musculoskeletal: no clubbing / cyanosis. No joint deformity upper and lower extremities. Good ROM, no contractures. Normal muscle tone.  Skin: no rashes, lesions, ulcers. No induration Neurologic: CN 2-12 grossly intact. Sensation intact, DTR normal. Strength 5/5 in all 4 limbs.  Psychiatric: Normal judgment and insight. Alert and oriented x 3. Normal mood.     Labs on Admission: I have personally reviewed following labs and imaging studies  CBC:  Recent Labs Lab  08/13/16 1148  WBC 10.1  NEUTROABS 6.8  HGB 10.0*  HCT 28.0*  MCV 87.5  PLT 325   Basic Metabolic Panel:  Recent Labs Lab 08/13/16 1148  NA 138  K 3.9  CL 106  CO2 25  GLUCOSE 84  BUN 15  CREATININE 0.83  CALCIUM 9.0   GFR: Estimated Creatinine Clearance: 127.7 mL/min (by C-G formula based on SCr of 0.83 mg/dL). Liver Function Tests:  Recent Labs Lab 08/13/16 1148  AST 25  ALT 13*  ALKPHOS 86  BILITOT 2.2*  PROT 8.2*  ALBUMIN 4.6   No results for input(s): LIPASE, AMYLASE in the last 168 hours. No results for input(s): AMMONIA in the last 168 hours. Coagulation Profile: No results for input(s): INR, PROTIME in the last 168 hours. Cardiac Enzymes: No results for input(s): CKTOTAL, CKMB, CKMBINDEX, TROPONINI in the last 168 hours. BNP (last 3 results) No results for input(s): PROBNP in the last 8760 hours. HbA1C: No results for input(s): HGBA1C in the last 72 hours. CBG: No results for input(s): GLUCAP in the last 168 hours. Lipid Profile: No results for input(s): CHOL, HDL, LDLCALC, TRIG, CHOLHDL, LDLDIRECT in the last 72 hours. Thyroid Function Tests: No results for input(s): TSH, T4TOTAL, FREET4, T3FREE, THYROIDAB in the last 72 hours. Anemia Panel:  Recent Labs  08/13/16 1148  RETICCTPCT 3.0   Urine analysis:    Component Value Date/Time   COLORURINE YELLOW 03/23/2016 2144   APPEARANCEUR CLEAR 03/23/2016 2144   LABSPEC 1.009 03/23/2016 2144   PHURINE 5.5 03/23/2016 2144   GLUCOSEU NEGATIVE 03/23/2016 2144   HGBUR NEGATIVE 03/23/2016 2144   BILIRUBINUR NEGATIVE 03/23/2016 2144   KETONESUR NEGATIVE 03/23/2016 2144   PROTEINUR NEGATIVE 03/23/2016 2144   UROBILINOGEN 0.2 10/25/2013 0020   NITRITE NEGATIVE 03/23/2016 2144   LEUKOCYTESUR NEGATIVE 03/23/2016 2144   Sepsis Labs: (procalcitonin:4,lacticidven:4) )No results found for this or any previous visit (from the past 240 hour(s)).   Radiological Exams on Admission: No results  found.     Assessment/Plan Principal Problem:   Sickle cell pain crisis /   Sickle cell disease, type [place]  - will start Dilaudid infusion, NS IV, continue MS Contin, give QID Toradol and O2  Active Problems:   Anemia - stable Hb  Nicotine abuse - advised to quit- does not smoke enough to require a nicoderm patch  DVT prophylaxis: Lovenox Code Status: Full code  Family Communication:   Disposition Plan:  Med/surg Consults called: none  Admission status: inpatient    Calvert Cantor MD [facility] Pager: [email or web address] Password [email or web address] 7PM-7AM, please contact night-coverage   08/13/2016, 5:05 PM

## 2016-08-14 DIAGNOSIS — D57219 Sickle-cell/Hb-C disease with crisis, unspecified: Principal | ICD-10-CM

## 2016-08-14 DIAGNOSIS — G894 Chronic pain syndrome: Secondary | ICD-10-CM

## 2016-08-14 DIAGNOSIS — D638 Anemia in other chronic diseases classified elsewhere: Secondary | ICD-10-CM

## 2016-08-14 LAB — BASIC METABOLIC PANEL
ANION GAP: 8 (ref 5–15)
BUN: 11 mg/dL (ref 6–20)
CALCIUM: 8.6 mg/dL — AB (ref 8.9–10.3)
CHLORIDE: 104 mmol/L (ref 101–111)
CO2: 24 mmol/L (ref 22–32)
Creatinine, Ser: 0.72 mg/dL (ref 0.61–1.24)
GFR calc non Af Amer: >60 mL/min (ref >60)
GLUCOSE: 125 mg/dL — AB (ref 65–99)
POTASSIUM: 3.4 mmol/L — AB (ref 3.5–5.1)
Sodium: 136 mmol/L (ref 135–145)

## 2016-08-14 LAB — CBC
HEMATOCRIT: 24.7 % — AB (ref 39.0–52.0)
HEMOGLOBIN: 9 g/dL — AB (ref 13.0–17.0)
MCH: 32.4 pg (ref 26.0–34.0)
MCHC: 36.4 g/dL — ABNORMAL HIGH (ref 30.0–36.0)
MCV: 88.8 fL (ref 78.0–100.0)
Platelets: 285 10*3/uL (ref 150–400)
RBC: 2.78 MIL/uL — AB (ref 4.22–5.81)
RDW: 14.1 % (ref 11.5–15.5)
WBC: 12.9 10*3/uL — ABNORMAL HIGH (ref 4.0–10.5)

## 2016-08-14 LAB — HIV ANTIBODY (ROUTINE TESTING W REFLEX): HIV Screen 4th Generation wRfx: NONREACTIVE

## 2016-08-14 MED ORDER — ENSURE ENLIVE PO LIQD
237.0000 mL | Freq: Two times a day (BID) | ORAL | Status: DC
Start: 2016-08-14 — End: 2016-08-17
  Administered 2016-08-14 – 2016-08-17 (×5): 237 mL via ORAL

## 2016-08-14 MED ORDER — HYDROMORPHONE HCL 2 MG/ML IJ SOLN
2.0000 mg | Freq: Once | INTRAMUSCULAR | Status: AC
  Administered 2016-08-14: 2 mg via INTRAVENOUS

## 2016-08-14 MED ORDER — LACTULOSE 10 GM/15ML PO SOLN
10.0000 g | Freq: Once | ORAL | Status: AC
  Administered 2016-08-14: 10 g via ORAL
  Filled 2016-08-14: qty 15

## 2016-08-14 MED ORDER — HYDROMORPHONE 1 MG/ML IV SOLN
INTRAVENOUS | Status: DC
  Administered 2016-08-14: 14 mg via INTRAVENOUS
  Administered 2016-08-14: 22:00:00 via INTRAVENOUS
  Administered 2016-08-14 – 2016-08-15 (×2): 4.2 mg via INTRAVENOUS
  Administered 2016-08-15: 20:00:00 via INTRAVENOUS
  Administered 2016-08-15: 2.4 mg via INTRAVENOUS
  Administered 2016-08-15: 8.74 mg via INTRAVENOUS
  Administered 2016-08-15: 3.79 mg via INTRAVENOUS
  Administered 2016-08-15: 4.2 mg via INTRAVENOUS
  Administered 2016-08-15: 3.6 mg via INTRAVENOUS
  Administered 2016-08-16: 5.4 mg via INTRAVENOUS
  Administered 2016-08-16: 4.6 mg via INTRAVENOUS
  Administered 2016-08-16: 7.8 mg via INTRAVENOUS
  Administered 2016-08-16: 15:00:00 via INTRAVENOUS
  Administered 2016-08-16: 4.8 mg via INTRAVENOUS
  Administered 2016-08-16: 5.8 mg via INTRAVENOUS
  Administered 2016-08-17: 9 mg via INTRAVENOUS
  Administered 2016-08-17: 8.4 mg via INTRAVENOUS
  Administered 2016-08-17: 5.4 mg via INTRAVENOUS
  Administered 2016-08-17: 06:00:00 via INTRAVENOUS
  Administered 2016-08-17: 5.4 mg via INTRAVENOUS
  Filled 2016-08-14 (×4): qty 25

## 2016-08-14 NOTE — Progress Notes (Signed)
Initial Nutrition Assessment  INTERVENTION:   Provide Ensure Enlive po BID, each supplement provides 350 kcal and 20 grams of protein Encourage PO intake RD to continue to monitor  NUTRITION DIAGNOSIS:   Inadequate oral intake related to poor appetite as evidenced by per patient/family report.  GOAL:   Patient will meet greater than or equal to 90% of their needs  MONITOR:   PO intake, Supplement acceptance, Labs, Weight trends, I & O's  REASON FOR ASSESSMENT:   Malnutrition Screening Tool    ASSESSMENT:   41 y.o. male with medical history of Sickle cell disease, Avascular necrosis of hip s/p replacement, acute chest syndrome who developed right lateral rib cage and right back pain 2 days ago. He has been taking his MS Contin and short acting Morphine without improvement in pain. No dyspnea, cough, fever, sore throat. No other complaints.   Patient in room with no family at bedside. Pt reports poor appetite PTA d/t pain level. Pt states he has lost weight but unable to tell RD an amount. Pt states he consumed grits, french toast, bacon and cereal this morning for breakfast. He is interested in a nutritional supplement while his appetite is poor.   Pt reports UBW of 175 lb. Per chart review, pt lost 8 lb since December 2017 (5% wt loss x 4 months, insignificant for time frame). Nutrition focused physical exam shows no sign of depletion of muscle mass or body fat.  Medications: Folic acid tablet daily, IV Zofran PRN Labs reviewed: Low K  Diet Order:  Diet regular Room service appropriate? Yes; Fluid consistency: Thin  Skin:  Reviewed, no issues  Last BM:  4/18  Height:   Ht Readings from Last 1 Encounters:  08/13/16  (1.88 m)    Weight:   Wt Readings from Last 1 Encounters:  08/14/16 177 lb 9.6 oz (80.6 kg)    Ideal Body Weight:  86.4 kg  BMI:  Body mass index is 22.8 kg/m.  Estimated Nutritional Needs:   Kcal:  2000-2200  Protein:  90-100g  Fluid:   2L/day  EDUCATION NEEDS:   No education needs identified at this time  Tilda Franco, MS, RD, LDN Pager: [phone] After Hours Pager: [phone]

## 2016-08-14 NOTE — Progress Notes (Signed)
SICKLE CELL SERVICE PROGRESS NOTE  [name] ZOX:[id number] DOB: 26-Apr-1976 DOA: 08/13/2016 PCP: Dorrene German, MD  Assessment/Plan: Principal Problem:   Sickle cell pain crisis (HCC) Active Problems:   Anemia   Sickle cell disease, type [place] (HCC)  1. Hb [place] with crisis: PCA adjusted this morning. Continue PCA at current dose. Continue Toradol and IVF.  2. Leukocytosis: Mild increase in WBC likely due to crisis.  3. Anemia of Chronic Disease: Hb stable at baseline.  4. Chronic pain:Continue MS Contin.   Code Status: Full Code Family Communication: N/A Disposition Plan: Not yet ready for discharge  Braxen Dobek A.  Pager [phone]. If 7PM-7AM, please contact night-coverage.  08/14/2016, 1:01 PM  LOS: 1 day   Interim History: Pt reports pain in right side ribs and back at 9-10/10. He is visibly in pain.   Consultants:  None  Procedures:  None  Antibiotics:  None   Objective: Vitals:   08/14/16 0400 08/14/16 0501 08/14/16 1016 08/14/16 1026  BP:  111/68  118/85  Pulse:  67  64  Resp: Temp:  98.3 F (36.8 C)  97.5 F (36.4 C)  TempSrc:  Oral  Oral  SpO2: 97% 97% 96% 99%  Weight:  80.6 kg (177 lb 9.6 oz)    Height:       Weight change:   Intake/Output Summary (Last 24 hours) at 08/14/16 1301 Last data filed at 08/14/16 1026  Gross per 24 hour  Intake          2521.48 ml  Output              450 ml  Net          2071.48 ml    General: Alert, awake, oriented x3, in moderate distress secondary to pain.  HEENT: [place]/AT PEERL, EOMI, anicteric. Neck: Trachea midline,  no masses, no thyromegal,y no JVD, no carotid bruit OROPHARYNX:  Moist, No exudate/ erythema/lesions.  Heart: Regular rate and rhythm, without murmurs, rubs, gallops, PMI non-displaced, no heaves or thrills on palpation.  Lungs: Clear to auscultation, no wheezing or rhonchi noted. No increased vocal fremitus resonant to percussion  Abdomen: Soft, nontender, nondistended,  positive bowel sounds, no masses no hepatosplenomegaly noted..  Neuro: No focal neurological deficits noted cranial nerves II through XII grossly intact.  Strength at baseline in bilateral upper extremities. Lower extremities not tested due to pain. Musculoskeletal: No warmth swelling or erythema around joints, no spinal tenderness noted. Psychiatric: Patient alert and oriented x3, good insight and cognition, good recent to remote recall.   Data Reviewed: Basic Metabolic Panel:  Recent Labs Lab 08/13/16 1148 08/14/16 0356  NA 138 136  K 3.9 3.4*  CL 106 104  CO2 25 24  GLUCOSE 84 125*  BUN 15 11  CREATININE 0.83 0.72  CALCIUM 9.0 8.6*   Liver Function Tests:  Recent Labs Lab 08/13/16 1148  AST 25  ALT 13*  ALKPHOS 86  BILITOT 2.2*  PROT 8.2*  ALBUMIN 4.6   No results for input(s): LIPASE, AMYLASE in the last 168 hours. No results for input(s): AMMONIA in the last 168 hours. CBC:  Recent Labs Lab 08/13/16 1148 08/14/16 0356  WBC 10.1 12.9*  NEUTROABS 6.8  --   HGB 10.0* 9.0*  HCT 28.0* 24.7*  MCV 87.5 88.8  PLT 325 285   Cardiac Enzymes: No results for input(s): CKTOTAL, CKMB, CKMBINDEX, TROPONINI in the last 168 hours. BNP (last 3 results) No results for  input(s): BNP in the last 8760 hours.  ProBNP (last 3 results) No results for input(s): PROBNP in the last 8760 hours.  CBG: No results for input(s): GLUCAP in the last 168 hours.  No results found for this or any previous visit (from the past 240 hour(s)).   Studies: No results found.  Scheduled Meds: . cyclobenzaprine  10 mg Oral QHS  . enoxaparin (LOVENOX) injection  40 mg Subcutaneous Q24H  . feeding supplement (ENSURE ENLIVE)  237 mL Oral BID BM  . folic acid  1 mg Oral QPM  . HYDROmorphone   Intravenous Q4H  . ketorolac  30 mg Intravenous Q6H  . morphine  60 mg Oral BID   Continuous Infusions: . sodium chloride 125 mL/hr at 08/14/16 1101  . diphenhydrAMINE (BENADRYL) IVPB(SICKLE CELL  ONLY)      Principal Problem:   Sickle cell pain crisis (HCC) Active Problems:   Anemia   Sickle cell disease, type [place] (HCC)   In excess of 25 minutes spent during this visit. Greater than 50% involved face to face contact with the patient for assessment, counseling and coordination of care.

## 2016-08-15 DIAGNOSIS — K5900 Constipation, unspecified: Secondary | ICD-10-CM

## 2016-08-15 DIAGNOSIS — D5700 Hb-SS disease with crisis, unspecified: Secondary | ICD-10-CM

## 2016-08-15 NOTE — Progress Notes (Signed)
SICKLE CELL SERVICE PROGRESS NOTE  [name] ZOX:[id number] DOB: 1975-05-10 DOA: 08/13/2016 PCP: Malik German, MD  Assessment/Plan: Principal Problem:   Sickle cell pain crisis (HCC) Active Problems:   Anemia   Sickle cell disease, type [place] (HCC)  1. Hb [place] with crisis: Continue PCA at current dose, Toradol and decrease IVF to [facility].  2. Chronic pain: Continue MS Contin 3. Constipation: Pt has not had a BM for 2 days but wants to wait another day and increase fluid intake and ambulation before trying a laxative.   Code Status: Full Code Family Communication: N/A Disposition Plan: Not yet ready for discharge  [name] A.  Pager [phone]. If 7PM-7AM, please contact night-coverage.  08/15/2016, 12:34 PM  LOS: 2 days   Interim History: Pt reports pain still in right side, back and shoulder but decreased to an intensity of 7/10. He has used 31.7 mg of dilaudid in the last 24 hours with 61/53:demands/deliveries in the last 24 hours.  Consultants:  None  Procedures:  None  Antibiotics:  None  Objective: Vitals:   08/15/16 0416 08/15/16 0818 08/15/16 1000 08/15/16 1204  BP: 126/87  132/86   Pulse: 64  72   Resp: Temp: 97.6 F (36.4 C)  98.5 F (36.9 C)   TempSrc: Oral  Oral   SpO2: 98% 99% 94% 100%  Weight: 81.8 kg (180 lb 6.4 oz)     Height:       Weight change: 4.717 kg (10 lb 6.4 oz)  Intake/Output Summary (Last 24 hours) at 08/15/16 1234 Last data filed at 08/15/16 0900  Gross per 24 hour  Intake          4186.63 ml  Output             4800 ml  Net          -613.37 ml    General: Alert, awake, oriented x3, in mild distress.  HEENT: [place]/AT PEERL, EOMI, anicteric Neck: Trachea midline,  no masses, no thyromegal,y no JVD, no carotid bruit OROPHARYNX:  Moist, No exudate/ erythema/lesions.  Heart: Regular rate and rhythm, without murmurs, rubs, gallops, PMI non-displaced, no heaves or thrills on palpation.  Lungs: Clear to  auscultation, no wheezing or rhonchi noted. No increased vocal fremitus resonant to percussion.  Abdomen: Soft, nontender, nondistended, positive bowel sounds, no masses no hepatosplenomegaly noted.  Neuro: No focal neurological deficits noted cranial nerves II through XII grossly intact.  Strength at baseline in bilateral upper and lower extremities. Gait normal Musculoskeletal: No warmth swelling or erythema around joints, no spinal tenderness noted. Psychiatric: Patient alert and oriented x3, good insight and cognition, good recent to remote recall.    Data Reviewed: Basic Metabolic Panel:  Recent Labs Lab 08/13/16 1148 08/14/16 0356  NA 138 136  K 3.9 3.4*  CL 106 104  CO2 25 24  GLUCOSE 84 125*  BUN 15 11  CREATININE 0.83 0.72  CALCIUM 9.0 8.6*   Liver Function Tests:  Recent Labs Lab 08/13/16 1148  AST 25  ALT 13*  ALKPHOS 86  BILITOT 2.2*  PROT 8.2*  ALBUMIN 4.6   No results for input(s): LIPASE, AMYLASE in the last 168 hours. No results for input(s): AMMONIA in the last 168 hours. CBC:  Recent Labs Lab 08/13/16 1148 08/14/16 0356  WBC 10.1 12.9*  NEUTROABS 6.8  --   HGB 10.0* 9.0*  HCT 28.0* 24.7*  MCV 87.5 88.8  PLT 325 285  Cardiac Enzymes: No results for input(s): CKTOTAL, CKMB, CKMBINDEX, TROPONINI in the last 168 hours. BNP (last 3 results) No results for input(s): BNP in the last 8760 hours.  ProBNP (last 3 results) No results for input(s): PROBNP in the last 8760 hours.  CBG: No results for input(s): GLUCAP in the last 168 hours.  No results found for this or any previous visit (from the past 240 hour(s)).   Studies: No results found.  Scheduled Meds: . cyclobenzaprine  10 mg Oral QHS  . enoxaparin (LOVENOX) injection  40 mg Subcutaneous Q24H  . feeding supplement (ENSURE ENLIVE)  237 mL Oral BID BM  . folic acid  1 mg Oral QPM  . HYDROmorphone   Intravenous Q4H  . ketorolac  30 mg Intravenous Q6H  . morphine  60 mg Oral BID    Continuous Infusions: . sodium chloride 125 mL/hr at 08/15/16 1202  . diphenhydrAMINE (BENADRYL) IVPB(SICKLE CELL ONLY)      Principal Problem:   Sickle cell pain crisis (HCC) Active Problems:   Anemia   Sickle cell disease, type [place] (HCC)    In excess of 25 minutes spent during this visit. Greater than 50% involved face to face contact with the patient for assessment, counseling and coordination of care.

## 2016-08-16 DIAGNOSIS — S301XXA Contusion of abdominal wall, initial encounter: Secondary | ICD-10-CM

## 2016-08-16 MED ORDER — MORPHINE SULFATE 30 MG PO TABS
30.0000 mg | ORAL_TABLET | ORAL | Status: DC
  Administered 2016-08-16 – 2016-08-17 (×6): 30 mg via ORAL
  Filled 2016-08-16 (×6): qty 1

## 2016-08-16 MED ORDER — LACTULOSE 10 GM/15ML PO SOLN: 30.0000 g | Freq: Every day | ORAL | Status: DC | PRN

## 2016-08-16 MED ORDER — LACTULOSE 10 GM/15ML PO SOLN
30.0000 g | Freq: Once | ORAL | Status: AC
  Administered 2016-08-16: 30 g via ORAL
  Filled 2016-08-16: qty 45

## 2016-08-16 NOTE — Progress Notes (Signed)
SICKLE CELL SERVICE PROGRESS NOTE  [name] ZOX:[id number] DOB: 06-03-1975 DOA: 08/13/2016 PCP: Dorrene German, MD  Assessment/Plan: Principal Problem:   Sickle cell pain crisis (HCC) Active Problems:   Anemia   Sickle cell disease, type [place] (HCC)  1. Hb [place] with crisis: Schedule MS IR and continue Toradol and PCA at current dose fro PRN use.  2. Leukocytosis: will check labs tomorrow.  3. Anemia of Chronic Disease: Pt asymptomatic. Check labs tomorrow.  4. Chronic pain: Continue Chronic pain.  5. Abdominal wall Hematoma: Abdominal wall hematoma from Lovenox injection. Pt requesting SCD's instead of Lovenox and he is currently ambulating around the halls. Will D/C Lovenox and order SCD's.  Code Status: Full Code Family Communication: N/A Disposition Plan: Hoping for discharge tomorrow.  Sharlene Mccluskey A.  Pager [phone]. If 7PM-7AM, please contact night-coverage.  08/16/2016, 2:08 PM  LOS: 3 days   Interim History: Today patient up ambulating in halls. He rates his pain at 6/10 and localized to right sided ribs and back. He also requests that SCD's be substituted instead of Lovenox for DVT prophylaxis in light of the Hematoma on the abdominal wall. No emesis. Nausea controlled and he had a BM yesterday.  Consultants:  None  Procedures:  None  Antibiotics:  None    Objective: Vitals:   08/16/16 0953 08/16/16 1223 08/16/16 1246 08/16/16 1316  BP: 114/85     Pulse:    96  Resp: 11 11 (!) 21   Temp: 97.9 F (36.6 C)     TempSrc: Oral     SpO2: 98% 97% 95% 96%  Weight:      Height:       Weight change: 0.726 kg (1 lb 9.6 oz)  Intake/Output Summary (Last 24 hours) at 08/16/16 1408 Last data filed at 08/16/16 1245  Gross per 24 hour  Intake           1800.5 ml  Output             4300 ml  Net          -2499.5 ml    General: Alert, awake, oriented x3, in no acute distress.  HEENT: [place]/AT PEERL, EOMI,anicteric Neck: Trachea midline,  no masses, no  thyromegal,y no JVD, no carotid bruit OROPHARYNX:  Moist, No exudate/ erythema/lesions.  Heart: Regular rate and rhythm, without murmurs, rubs, gallops, PMI non-displaced, no heaves or thrills on palpation.  Lungs: Clear to auscultation, no wheezing or rhonchi noted. No increased vocal fremitus resonant to percussion.   Abdomen: Soft, nontender, nondistended, positive bowel sounds,  no hepatosplenomegaly noted. Pt has a small hematoma on the abdominal wall.  Neuro: No focal neurological deficits noted cranial nerves II through XII grossly intact.  Strength at baseline in bilateral upper and lower extremities. Musculoskeletal: No warmth swelling or erythema around joints, no spinal tenderness noted. Psychiatric: Patient alert and oriented x3, good insight and cognition, good recent to remote recall.   Data Reviewed: Basic Metabolic Panel:  Recent Labs Lab 08/13/16 1148 08/14/16 0356  NA 138 136  K 3.9 3.4*  CL 106 104  CO2 25 24  GLUCOSE 84 125*  BUN 15 11  CREATININE 0.83 0.72  CALCIUM 9.0 8.6*   Liver Function Tests:  Recent Labs Lab 08/13/16 1148  AST 25  ALT 13*  ALKPHOS 86  BILITOT 2.2*  PROT 8.2*  ALBUMIN 4.6   No results for input(s): LIPASE, AMYLASE in the last 168 hours. No results for input(s): AMMONIA in  the last 168 hours. CBC:  Recent Labs Lab 08/13/16 1148 08/14/16 0356  WBC 10.1 12.9*  NEUTROABS 6.8  --   HGB 10.0* 9.0*  HCT 28.0* 24.7*  MCV 87.5 88.8  PLT 325 285   Cardiac Enzymes: No results for input(s): CKTOTAL, CKMB, CKMBINDEX, TROPONINI in the last 168 hours. BNP (last 3 results) No results for input(s): BNP in the last 8760 hours.  ProBNP (last 3 results) No results for input(s): PROBNP in the last 8760 hours.  CBG: No results for input(s): GLUCAP in the last 168 hours.  No results found for this or any previous visit (from the past 240 hour(s)).   Studies: No results found.  Scheduled Meds: . cyclobenzaprine  10 mg Oral QHS   . enoxaparin (LOVENOX) injection  40 mg Subcutaneous Q24H  . feeding supplement (ENSURE ENLIVE)  237 mL Oral BID BM  . folic acid  1 mg Oral QPM  . HYDROmorphone   Intravenous Q4H  . ketorolac  30 mg Intravenous Q6H  . morphine  60 mg Oral BID  . morphine  30 mg Oral Q4H   Continuous Infusions: . sodium chloride 10 mL/hr at 08/15/16 1340  . diphenhydrAMINE (BENADRYL) IVPB(SICKLE CELL ONLY)      Principal Problem:   Sickle cell pain crisis (HCC) Active Problems:   Anemia   Sickle cell disease, type [place] (HCC)   In excess of 25 minutes spent during this visit. Greater than 50% involved face to face contact with the patient for assessment, counseling and coordination of care.

## 2016-08-17 LAB — CBC WITH DIFFERENTIAL/PLATELET
BASOS ABS: 0.1 10*3/uL (ref 0.0–0.1)
BASOS PCT: 1 %
Eosinophils Absolute: 1.3 10*3/uL — ABNORMAL HIGH (ref 0.0–0.7)
Eosinophils Relative: 10 %
HEMATOCRIT: 25 % — AB (ref 39.0–52.0)
Hemoglobin: 9.1 g/dL — ABNORMAL LOW (ref 13.0–17.0)
LYMPHS PCT: 20 %
Lymphs Abs: 2.5 10*3/uL (ref 0.7–4.0)
MCH: 31.4 pg (ref 26.0–34.0)
MCHC: 36.4 g/dL — ABNORMAL HIGH (ref 30.0–36.0)
MCV: 86.2 fL (ref 78.0–100.0)
Monocytes Absolute: 1.1 10*3/uL — ABNORMAL HIGH (ref 0.1–1.0)
Monocytes Relative: 9 %
NEUTROS ABS: 7.6 10*3/uL (ref 1.7–7.7)
Neutrophils Relative %: 60 %
PLATELETS: 262 10*3/uL (ref 150–400)
RBC: 2.9 MIL/uL — AB (ref 4.22–5.81)
RDW: 15.4 % (ref 11.5–15.5)
WBC: 12.5 10*3/uL — AB (ref 4.0–10.5)

## 2016-08-17 LAB — BASIC METABOLIC PANEL
ANION GAP: 7 (ref 5–15)
BUN: 11 mg/dL (ref 6–20)
CALCIUM: 9.3 mg/dL (ref 8.9–10.3)
CO2: 33 mmol/L — AB (ref 22–32)
Chloride: 101 mmol/L (ref 101–111)
Creatinine, Ser: 0.8 mg/dL (ref 0.61–1.24)
GLUCOSE: 88 mg/dL (ref 65–99)
Potassium: 4.1 mmol/L (ref 3.5–5.1)
Sodium: 141 mmol/L (ref 135–145)

## 2016-08-17 MED ORDER — MAGNESIUM CITRATE PO SOLN
1.0000 | Freq: Once | ORAL | Status: AC
  Administered 2016-08-17: 1 via ORAL

## 2016-08-17 NOTE — Care Management Important Message (Signed)
Important Message  Patient Details  Name: [name] MRN: [id number] Date of Birth: 01-17-1976   Medicare Important Message Given:  Yes    Caren Macadam 08/17/2016, 11:10 AMImportant Message  Patient Details  Name: [name] MRN: [id number] Date of Birth: 19-Jul-1975   Medicare Important Message Given:  Yes    Caren Macadam 08/17/2016, 11:10 AM

## 2016-08-17 NOTE — Progress Notes (Signed)
Pt discharged from unit via wheelchair to home in stable condition. Discharge instructions given. Pt verbalized understanding. No immediate questions or concerns.

## 2016-08-17 NOTE — Discharge Summary (Signed)
[name] MRN: [id number] DOB/AGE: [birth year] 41 y.o.  Admit date: 08/13/2016 Discharge date: 08/17/2016  Primary Care Physician:  Dorrene German, MD   Discharge Diagnoses:   Patient Active Problem List   Diagnosis Date Noted  . Elevated d-dimer   . HCAP (healthcare-associated pneumonia) 04/21/2016  . Sickle cell anemia (HCC) 08/13/2015  . Pleuritic chest pain 02/08/2015  . Sickle cell disease, type [place] (HCC) 10/18/2012  . Sickle cell pain crisis (HCC) 10/13/2012  . History of tobacco abuse 10/13/2012  . Anemia 11/08/2011  . Avascular necrosis of hip, left (HCC) 08/27/2011  . Tobacco abuse 05/15/2011  . Sickle cell anemia with crisis (HCC) 05/13/2011    DISCHARGE MEDICATION: Allergies as of 08/17/2016   No Known Allergies     Medication List    STOP taking these medications   HYDROmorphone 2 MG tablet Commonly known as:  DILAUDID   levofloxacin 750 MG tablet Commonly known as:  LEVAQUIN     TAKE these medications   cyclobenzaprine 10 MG tablet Commonly known as:  FLEXERIL Take 10 mg by mouth at bedtime.   folic acid 1 MG tablet Commonly known as:  FOLVITE Take 1 mg by mouth every evening.   morphine 60 MG 12 hr tablet Commonly known as:  MS CONTIN Take 1 tablet (60 mg total) by mouth 2 (two) times daily.   morphine 30 MG tablet Commonly known as:  MSIR Take 1 tablet (30 mg total) by mouth daily as needed for severe pain (pain). Resume after completing dilaudid.   morphine 60 MG 12 hr tablet Commonly known as:  MS CONTIN Take 60 mg by mouth 2 (two) times daily.   promethazine 25 MG tablet Commonly known as:  PHENERGAN Take 25 mg by mouth every 6 (six) hours as needed for nausea (nausea).         Consults:    SIGNIFICANT DIAGNOSTIC STUDIES:  No results found.     No results found for this or any previous visit (from the past 240 hour(s)).  BRIEF ADMITTING H & P [name] is a 41 y.o. male with medical history of Sickle cell  disease, Avascular necrosis of hip s/p replacement, acute chest syndrome who developed right lateral rib cage and right back pain 2 days ago. He has been taking his MS Contin and short acting Morphine without improvement in pain. No dyspnea, cough, fever, sore throat. No other complaints.   ED Course:  Given 10 mg Dilaudid IV in past 5 hrs along with Toradol 30 mg and IVF   Hospital Course:  Present on Admission: . Sickle cell pain crisis (HCC) . Sickle cell disease, type [place] (HCC) . Anemia  [name] is an opiate tolerant patient who was admitted with sickle cell crisis. Pain crisis was treated with IV Dilaudid via the PCA, Toradol and IV fluids. The patient had an uneventful hospital course with progressive resolution of his pain. As pain resolves he was transitioned to his oral analgesics and at the time of discharge the patient was ambulatory with good pain control and very minimal use of IV analgesics. He states discharge home in his prehospital regimen is to follow-up with his primary care physician as needed.  Disposition and Follow-up: Patient discharged home in good condition. He's to follow-up with his primary care physician as needed.   DISCHARGE EXAM:  General: Alert, awake, oriented x3, in no apparent distress.  HEENT: [place]/AT PEERL, EOMI, anicteric Neck: Trachea midline, no masses, no thyromegal,y no JVD,  no carotid bruit OROPHARYNX: Moist, No exudate/ erythema/lesions.  Heart: Regular rate and rhythm, without murmurs, rubs, gallops or S3. PMI non-displaced. Exam reveals no decreased pulses. Pulmonary/Chest: Normal effort. Breath sounds normal. No. Apnea. Clear to auscultation,no stridor,  no wheezing and no rhonchi noted. No respiratory distress and no tenderness noted. Abdomen: Soft, nontender, nondistended, normal bowel sounds, no masses no hepatosplenomegaly noted. No fluid wave and no ascites. There is no guarding or rebound. Neuro: Alert and oriented to person, place and  time. Normal motor skills, Displays no atrophy or tremors and exhibits normal muscle tone.  No focal neurological deficits noted cranial nerves II through XII grossly intact. No sensory deficit noted.  Strength at baseline in bilateral upper and lower extremities. Gait normal. Musculoskeletal: No warm swelling or erythema around joints, no spinal tenderness noted. Psychiatric: Patient alert and oriented x3, good insight and cognition, good recent to remote recall. Mood,  affect and judgement normal Lymph node survey: No cervical axillary or inguinal lymphadenopathy noted. Skin: Skin is warm and dry. No bruising, no ecchymosis and no rash noted. Pt is not diaphoretic. No erythema. No pallor    Blood pressure 126/87, pulse 71, temperature 98.2 F (36.8 C), temperature source Oral, resp. rate 11, height  (1.88 m), weight 82.6 kg (182 lb), SpO2 90 %.   Recent Labs  08/17/16 1045  NA 141  K 4.1  CL 101  CO2 33*  GLUCOSE 88  BUN 11  CREATININE 0.80  CALCIUM 9.3   No results for input(s): AST, ALT, ALKPHOS, BILITOT, PROT, ALBUMIN in the last 72 hours. No results for input(s): LIPASE, AMYLASE in the last 72 hours.  Recent Labs  08/17/16 1045  WBC 12.5*  NEUTROABS 7.6  HGB 9.1*  HCT 25.0*  MCV 86.2  PLT 262     Total time spent including face to face and decision making was less than 30 minutes  Signed: Laila Myhre A. 08/17/2016, 1:18 PM

## 2016-08-17 NOTE — Plan of Care (Signed)
Problem: Bowel/Gastric: Goal: Will not experience complications related to bowel motility Outcome: Not Progressing LBM 4/18. Took lactulose today, awaiting results.

## 2016-09-17 ENCOUNTER — Emergency Department (HOSPITAL_COMMUNITY): Payer: Medicare Other

## 2016-09-17 ENCOUNTER — Inpatient Hospital Stay (HOSPITAL_COMMUNITY)
Admission: EM | Admit: 2016-09-17 | Discharge: 2016-09-21 | DRG: 812 | Disposition: A | Discharge status: Home / Self Care | Payer: Medicare Other | Attending: Internal Medicine | Admitting: Internal Medicine

## 2016-09-17 ENCOUNTER — Encounter (HOSPITAL_COMMUNITY): Payer: Self-pay | Admitting: Emergency Medicine

## 2016-09-17 DIAGNOSIS — G894 Chronic pain syndrome: Secondary | ICD-10-CM | POA: Diagnosis present

## 2016-09-17 DIAGNOSIS — K5903 Drug induced constipation: Secondary | ICD-10-CM | POA: Diagnosis present

## 2016-09-17 DIAGNOSIS — Z96641 Presence of right artificial hip joint: Secondary | ICD-10-CM | POA: Diagnosis present

## 2016-09-17 DIAGNOSIS — F17210 Nicotine dependence, cigarettes, uncomplicated: Secondary | ICD-10-CM | POA: Diagnosis present

## 2016-09-17 DIAGNOSIS — D5720 Sickle-cell/Hb-C disease without crisis: Secondary | ICD-10-CM | POA: Diagnosis present

## 2016-09-17 DIAGNOSIS — Z79891 Long term (current) use of opiate analgesic: Secondary | ICD-10-CM | POA: Diagnosis not present

## 2016-09-17 DIAGNOSIS — D57219 Sickle-cell/Hb-C disease with crisis, unspecified: Secondary | ICD-10-CM | POA: Diagnosis not present

## 2016-09-17 DIAGNOSIS — K5900 Constipation, unspecified: Secondary | ICD-10-CM | POA: Diagnosis not present

## 2016-09-17 DIAGNOSIS — T402X5A Adverse effect of other opioids, initial encounter: Secondary | ICD-10-CM | POA: Diagnosis not present

## 2016-09-17 DIAGNOSIS — D638 Anemia in other chronic diseases classified elsewhere: Secondary | ICD-10-CM | POA: Diagnosis present

## 2016-09-17 DIAGNOSIS — D5700 Hb-SS disease with crisis, unspecified: Secondary | ICD-10-CM | POA: Diagnosis not present

## 2016-09-17 LAB — CBC WITH DIFFERENTIAL/PLATELET
Basophils Absolute: 0.1 10*3/uL (ref 0.0–0.1)
Basophils Relative: 1 %
EOS PCT: 5 %
Eosinophils Absolute: 0.7 10*3/uL (ref 0.0–0.7)
HCT: 28.2 % — ABNORMAL LOW (ref 39.0–52.0)
Hemoglobin: 10 g/dL — ABNORMAL LOW (ref 13.0–17.0)
LYMPHS ABS: 2.9 10*3/uL (ref 0.7–4.0)
Lymphocytes Relative: 23 %
MCH: 31.9 pg (ref 26.0–34.0)
MCHC: 35.5 g/dL (ref 30.0–36.0)
MCV: 90.1 fL (ref 78.0–100.0)
MONO ABS: 1 10*3/uL (ref 0.1–1.0)
Monocytes Relative: 8 %
Neutro Abs: 7.8 10*3/uL — ABNORMAL HIGH (ref 1.7–7.7)
Neutrophils Relative %: 63 %
Platelets: 291 10*3/uL (ref 150–400)
RBC: 3.13 MIL/uL — ABNORMAL LOW (ref 4.22–5.81)
RDW: 14 % (ref 11.5–15.5)
WBC: 12.5 10*3/uL — ABNORMAL HIGH (ref 4.0–10.5)

## 2016-09-17 LAB — COMPREHENSIVE METABOLIC PANEL
ALBUMIN: 4.4 g/dL (ref 3.5–5.0)
ALT: 12 U/L — AB (ref 17–63)
AST: 22 U/L (ref 15–41)
Alkaline Phosphatase: 92 U/L (ref 38–126)
Anion gap: 7 (ref 5–15)
BILIRUBIN TOTAL: 1.9 mg/dL — AB (ref 0.3–1.2)
BUN: 17 mg/dL (ref 6–20)
CHLORIDE: 106 mmol/L (ref 101–111)
CO2: 27 mmol/L (ref 22–32)
Calcium: 9.2 mg/dL (ref 8.9–10.3)
Creatinine, Ser: 0.79 mg/dL (ref 0.61–1.24)
GFR calc Af Amer: >60 mL/min (ref >60)
GFR calc non Af Amer: >60 mL/min (ref >60)
GLUCOSE: 86 mg/dL (ref 65–99)
POTASSIUM: 3.8 mmol/L (ref 3.5–5.1)
Sodium: 140 mmol/L (ref 135–145)
TOTAL PROTEIN: 8.2 g/dL — AB (ref 6.5–8.1)

## 2016-09-17 LAB — RETICULOCYTES
RBC.: 3.13 MIL/uL — AB (ref 4.22–5.81)
Retic Count, Absolute: 81.4 10*3/uL (ref 19.0–186.0)
Retic Ct Pct: 2.6 % (ref 0.4–3.1)

## 2016-09-17 MED ORDER — FOLIC ACID 1 MG PO TABS
1.0000 mg | ORAL_TABLET | Freq: Every evening | ORAL | Status: DC
  Administered 2016-09-17 – 2016-09-20 (×4): 1 mg via ORAL
  Filled 2016-09-17 (×4): qty 1

## 2016-09-17 MED ORDER — HYDROMORPHONE HCL 1 MG/ML IJ SOLN: 2.0000 mg | INTRAMUSCULAR | Status: AC

## 2016-09-17 MED ORDER — KETOROLAC TROMETHAMINE 30 MG/ML IJ SOLN
30.0000 mg | Freq: Four times a day (QID) | INTRAMUSCULAR | Status: DC
  Administered 2016-09-17 – 2016-09-21 (×15): 30 mg via INTRAVENOUS
  Filled 2016-09-17 (×15): qty 1

## 2016-09-17 MED ORDER — ONDANSETRON HCL 4 MG/2ML IJ SOLN
4.0000 mg | INTRAMUSCULAR | Status: DC | PRN
  Administered 2016-09-17: 4 mg via INTRAVENOUS
  Filled 2016-09-17: qty 2

## 2016-09-17 MED ORDER — SODIUM CHLORIDE 0.9% FLUSH
9.0000 mL | INTRAVENOUS | Status: DC | PRN
Start: 2016-09-17 — End: 2016-09-21

## 2016-09-17 MED ORDER — MORPHINE SULFATE ER 30 MG PO TBCR
60.0000 mg | EXTENDED_RELEASE_TABLET | Freq: Two times a day (BID) | ORAL | Status: DC
  Administered 2016-09-17 – 2016-09-21 (×8): 60 mg via ORAL
  Filled 2016-09-17 (×8): qty 2

## 2016-09-17 MED ORDER — KETOROLAC TROMETHAMINE 30 MG/ML IJ SOLN
30.0000 mg | INTRAMUSCULAR | Status: AC
  Administered 2016-09-17: 30 mg via INTRAVENOUS
  Filled 2016-09-17: qty 1

## 2016-09-17 MED ORDER — CYCLOBENZAPRINE HCL 10 MG PO TABS
10.0000 mg | ORAL_TABLET | Freq: Every day | ORAL | Status: DC
  Administered 2016-09-17 – 2016-09-20 (×4): 10 mg via ORAL
  Filled 2016-09-17 (×4): qty 1

## 2016-09-17 MED ORDER — SENNOSIDES-DOCUSATE SODIUM 8.6-50 MG PO TABS
1.0000 | ORAL_TABLET | Freq: Two times a day (BID) | ORAL | Status: DC
  Administered 2016-09-17 – 2016-09-21 (×7): 1 via ORAL
  Filled 2016-09-17 (×8): qty 1

## 2016-09-17 MED ORDER — HYDROMORPHONE HCL 1 MG/ML IJ SOLN
2.0000 mg | INTRAMUSCULAR | Status: DC
  Administered 2016-09-17: 2 mg via INTRAVENOUS
  Filled 2016-09-17: qty 2

## 2016-09-17 MED ORDER — HYDROMORPHONE HCL 1 MG/ML IJ SOLN: 2.0000 mg | INTRAMUSCULAR | Status: DC

## 2016-09-17 MED ORDER — HYDROMORPHONE HCL 1 MG/ML IJ SOLN
2.0000 mg | INTRAMUSCULAR | Status: AC
  Administered 2016-09-17: 2 mg via INTRAVENOUS
  Filled 2016-09-17: qty 2

## 2016-09-17 MED ORDER — ENOXAPARIN SODIUM 40 MG/0.4ML ~~LOC~~ SOLN
40.0000 mg | SUBCUTANEOUS | Status: DC
  Filled 2016-09-17 (×2): qty 0.4

## 2016-09-17 MED ORDER — DIPHENHYDRAMINE HCL 50 MG/ML IJ SOLN
25.0000 mg | INTRAMUSCULAR | Status: DC | PRN
  Filled 2016-09-17: qty 0.5

## 2016-09-17 MED ORDER — PROMETHAZINE HCL 25 MG PO TABS
25.0000 mg | ORAL_TABLET | Freq: Four times a day (QID) | ORAL | Status: DC | PRN
  Administered 2016-09-17 – 2016-09-20 (×9): 25 mg via ORAL
  Filled 2016-09-17 (×9): qty 1

## 2016-09-17 MED ORDER — HYDROMORPHONE 1 MG/ML IV SOLN
INTRAVENOUS | Status: DC
  Administered 2016-09-17: 12.6 mg via INTRAVENOUS
  Administered 2016-09-17: 25 mg via INTRAVENOUS
  Administered 2016-09-18: 4.2 mg via INTRAVENOUS
  Administered 2016-09-18: 25 mg via INTRAVENOUS
  Administered 2016-09-18: 4.2 mg via INTRAVENOUS
  Administered 2016-09-18: 7.2 mg via INTRAVENOUS
  Administered 2016-09-18: 3.6 mg via INTRAVENOUS
  Administered 2016-09-18: 25 mg via INTRAVENOUS
  Administered 2016-09-18: 7.8 mg via INTRAVENOUS
  Administered 2016-09-19: 25 mg via INTRAVENOUS
  Administered 2016-09-19: 5.4 mg via INTRAVENOUS
  Administered 2016-09-19: 9 mg via INTRAVENOUS
  Administered 2016-09-19: 8.4 mg via INTRAVENOUS
  Administered 2016-09-19: 4.2 mg via INTRAVENOUS
  Administered 2016-09-19: 5.4 mg via INTRAVENOUS
  Administered 2016-09-19: 2.1 mg via INTRAVENOUS
  Administered 2016-09-19: 7 mg via INTRAVENOUS
  Administered 2016-09-20: 9 mg via INTRAVENOUS
  Administered 2016-09-20: 25 mg via INTRAVENOUS
  Administered 2016-09-20: 10.79 mg via INTRAVENOUS
  Administered 2016-09-20: 3.6 mg via INTRAVENOUS
  Administered 2016-09-20: 3 mg via INTRAVENOUS
  Administered 2016-09-20: 25 mg via INTRAVENOUS
  Administered 2016-09-21: 4.8 mg via INTRAVENOUS
  Administered 2016-09-21: 5.4 mg via INTRAVENOUS
  Filled 2016-09-17 (×6): qty 25

## 2016-09-17 MED ORDER — DIPHENHYDRAMINE HCL 25 MG PO CAPS: 25.0000 mg | ORAL_CAPSULE | ORAL | Status: DC | PRN

## 2016-09-17 MED ORDER — POLYETHYLENE GLYCOL 3350 17 G PO PACK
17.0000 g | PACK | Freq: Every day | ORAL | Status: DC | PRN
  Administered 2016-09-19 – 2016-09-20 (×2): 17 g via ORAL
  Filled 2016-09-17 (×2): qty 1

## 2016-09-17 MED ORDER — DEXTROSE-NACL 5-0.45 % IV SOLN
INTRAVENOUS | Status: DC
  Administered 2016-09-17 – 2016-09-21 (×9): via INTRAVENOUS

## 2016-09-17 MED ORDER — DIPHENHYDRAMINE HCL 25 MG PO CAPS
25.0000 mg | ORAL_CAPSULE | ORAL | Status: DC | PRN
  Administered 2016-09-17: 50 mg via ORAL
  Filled 2016-09-17: qty 2

## 2016-09-17 MED ORDER — NALOXONE HCL 0.4 MG/ML IJ SOLN: 0.4000 mg | INTRAMUSCULAR | Status: DC | PRN

## 2016-09-17 NOTE — ED Notes (Signed)
WILL TRANSPORT PT TO 3W 1336-1. AAOX4. PT IN NO APPARENT DISTRESS WITH SEVERE PAIN. IVF INFUSING W/O PAIN OR SWELLING. THE OPPORTUNITY TO ASK QUESTIONS WAS PROVIDED.

## 2016-09-17 NOTE — ED Provider Notes (Signed)
[facility]-EMERGENCY DEPT Provider Note   CSN: [id number] Arrival date & time: 09/17/16  1024     History   Chief Complaint Chief Complaint  Patient presents with  . Sickle Cell Pain Crisis    HPI [name] is a 41 y.o. male.  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 He's been ongoing for the last couple days. The right-sided chest pain is typical of his prior sickle cell crises. Some pleuritic pain, this is typical for him. No shortness of breath, cough, or fever. Has also had a traumatic right wrist pain without swelling. No redness. Limited range of motion as it causes pain when he does this. No weakness or numbness in his extremity. He's been taking ibuprofen, MS Contin, and morphine IR  Past Medical History:  Diagnosis Date  . Avascular necrosis of hip (HCC)    bilateral  . Avascular necrosis of hip, left (HCC) 08/27/2011  . Blood transfusion   . Infection of bone, shoulder region (HCC)    left shoulder  . Pneumonia   . Sickle cell crisis [facility])     Patient Active Problem List   Diagnosis Date Noted  . Elevated d-dimer   . HCAP (healthcare-associated pneumonia) 04/21/2016  . Sickle cell anemia (HCC) 08/13/2015  . Pleuritic chest pain 02/08/2015  . Sickle cell disease, type [place] (HCC) 10/18/2012  . Sickle cell pain crisis (HCC) 10/13/2012  . History of tobacco abuse 10/13/2012  . Anemia 11/08/2011  . Avascular necrosis of hip, left (HCC) 08/27/2011  . Tobacco abuse 05/15/2011  . Sickle cell anemia with crisis (HCC) 05/13/2011    Past Surgical History:  Procedure Laterality Date  . BONE GRAFT HIP ILIAC CREST    . JOINT REPLACEMENT  2006   right total hip arthroplasty  . Orif right hip fracture  1995       Home Medications    Prior to Admission medications   Medication Sig Start Date End Date Taking? Authorizing Provider  cyclobenzaprine (FLEXERIL)  10 MG tablet Take 10 mg by mouth at bedtime.   Yes [provider]  folic acid (FOLVITE) 1 MG tablet Take 1 mg by mouth every evening.    Yes [provider]  ibuprofen (ADVIL,MOTRIN) 800 MG tablet Take 800 mg by mouth 3 (three) times daily. 09/07/16  Yes [provider]  morphine (MS CONTIN) 60 MG 12 hr tablet Take 1 tablet (60 mg total) by mouth 2 (two) times daily. 11/20/11  Yes Grayce Sessions, NP  morphine (MSIR) 30 MG tablet Take 1 tablet (30 mg total) by mouth daily as needed for severe pain (pain). Resume after completing dilaudid. 02/17/16  Yes Altha Harm, MD  promethazine (PHENERGAN) 25 MG tablet Take 25 mg by mouth every 6 (six) hours as needed for nausea (nausea).    Yes [provider]    Family History Family History  Problem Relation Age of Onset  . Adopted: Yes    Social History Social History  Substance Use Topics  . Smoking status: Current Some Day Smoker    Packs/day: 0.50    Types: Cigarettes    Last attempt to quit: 01/27/2012  . Smokeless tobacco: Never Used  . Alcohol use No     Allergies   Patient has no known allergies.   Review of Systems Review of Systems  Constitutional: Negative for fever.  Respiratory: Negative  for cough and shortness of breath.   Cardiovascular: Positive for chest pain.  Gastrointestinal: Negative for abdominal pain.  Musculoskeletal: Positive for arthralgias. Negative for back pain and joint swelling.  Neurological: Negative for weakness and numbness.  All other systems reviewed and are negative.    Physical Exam Updated Vital Signs BP 120/85 (BP Location: Right Arm)   Pulse 77   Temp 98.3 F (36.8 C) (Oral)   Resp 12   Ht 6\' 2"  (1.88 m)   Wt 77.1 kg (170 lb)   SpO2 96%   BMI 21.83 kg/m   Physical Exam  Constitutional: He is oriented to person, place, and time. He appears well-developed and well-nourished. No distress.  HENT:  Head: Normocephalic and atraumatic.    Right Ear: External ear normal.  Left Ear: External ear normal.  Nose: Nose normal.  Eyes: Right eye exhibits no discharge. Left eye exhibits no discharge.  Neck: Neck supple.  Cardiovascular: Normal rate, regular rhythm and normal heart sounds.   Pulses:      Radial pulses are 2+ on the right side.  Pulmonary/Chest: Effort normal and breath sounds normal. He exhibits no tenderness.  Abdominal: Soft. There is no tenderness.  Musculoskeletal: He exhibits no edema.       Right wrist: He exhibits decreased range of motion (painful, however is able to move) and tenderness. He exhibits no swelling and no effusion.       Right forearm: He exhibits no tenderness.       Right hand: He exhibits no tenderness.  Normal strength/sensation in RUE  Neurological: He is alert and oriented to person, place, and time.  Skin: Skin is warm and dry. He is not diaphoretic.  Nursing note and vitals reviewed.    ED Treatments / Results  Labs (all labs ordered are listed, but only abnormal results are displayed) Labs Reviewed  COMPREHENSIVE METABOLIC PANEL - Abnormal; Notable for the following:       Result Value   Total Protein 8.2 (*)    ALT 12 (*)    Total Bilirubin 1.9 (*)    All other components within normal limits  CBC WITH DIFFERENTIAL/PLATELET - Abnormal; Notable for the following:    WBC 12.5 (*)    RBC 3.13 (*)    Hemoglobin 10.0 (*)    HCT 28.2 (*)    Neutro Abs 7.8 (*)    All other components within normal limits  RETICULOCYTES - Abnormal; Notable for the following:    RBC. 3.13 (*)    All other components within normal limits    EKG  EKG Interpretation  Date/Time:  Thursday Sep 17 2016 12:15:44 EDT Ventricular Rate:  70 PR Interval:    QRS Duration: 92 QT Interval:  393 QTC Calculation: 424 R Axis:   8 Text Interpretation:  Sinus rhythm Abnormal R-wave progression, early transition no significant change since 2017 Confirmed by Pricilla LovelessGoldston, Genette Huertas [phone]([id number]) on 09/17/2016 1:54:12  PM       Radiology Dg Chest 2 View  Result Date: 09/17/2016 CLINICAL DATA:  Right chest and rib pain. Patient with sickle cell disease. EXAM: CHEST  2 VIEW COMPARISON:  04/24/2016 and prior exams FINDINGS: Mild cardiomegaly and right lower lung scarring again noted. There is no evidence of focal airspace disease, pulmonary edema, suspicious pulmonary nodule/mass, pleural effusion, or pneumothorax. No acute bony abnormalities are identified. IMPRESSION: No evidence of acute cardiopulmonary disease. Electronically Signed   By: Harmon PierJeffrey  Hu M.D.   On: 09/17/2016 13:18  Dg Wrist Complete Right  Result Date: 09/17/2016 CLINICAL DATA:  Right wrist pain for several weeks. No known injury. Initial encounter. EXAM: RIGHT WRIST - COMPLETE 3+ VIEW COMPARISON:  None. FINDINGS: There is no evidence of fracture or dislocation. There is no evidence of arthropathy or other focal bone abnormality. Soft tissues are unremarkable. IMPRESSION: Negative. Electronically Signed   By: Harmon Pier M.D.   On: 09/17/2016 13:19    Procedures Procedures (including critical care time)  Medications Ordered in ED Medications  dextrose 5 %-0.45 % sodium chloride infusion (not administered)  ketorolac (TORADOL) 30 MG/ML injection 30 mg (not administered)  HYDROmorphone (DILAUDID) injection 2 mg (not administered)    Or  HYDROmorphone (DILAUDID) injection 2 mg (not administered)  HYDROmorphone (DILAUDID) injection 2 mg (not administered)    Or  HYDROmorphone (DILAUDID) injection 2 mg (not administered)  HYDROmorphone (DILAUDID) injection 2 mg (not administered)    Or  HYDROmorphone (DILAUDID) injection 2 mg (not administered)  HYDROmorphone (DILAUDID) injection 2 mg (not administered)    Or  HYDROmorphone (DILAUDID) injection 2 mg (not administered)  ondansetron (ZOFRAN) injection 4 mg (not administered)  diphenhydrAMINE (BENADRYL) capsule 25-50 mg (not administered)     Initial Impression / Assessment and Plan /  ED Course  I have reviewed the triage vital signs and the nursing notes.  Pertinent labs & imaging results that were available during my care of the patient were reviewed by me and considered in my medical decision making (see chart for details).     Patient overall appears well. R wrist is a new pain location for him, probably due to sickling. No obvious bony pathology on Xray. Mild decreased ROM but no swelling, effusion or redness. Pain overall somewhat improved but still severe. Admit for pain crisis.  Final Clinical Impressions(s) / ED Diagnoses   Final diagnoses:  Sickle cell pain crisis [facility])    New Prescriptions New Prescriptions   No medications on file     Pricilla Loveless, MD 09/17/16 2107

## 2016-09-17 NOTE — ED Triage Notes (Signed)
Pt c/o sickle cell pain crisis, worsening x several days. Pt with R wrist pain 10/10 and generalized body pain.

## 2016-09-17 NOTE — H&P (Signed)
Hospital Admission Note Date: 09/17/2016  Patient name: Malik PortHoward E Natividad Medical record number: [id number] Date of birth: 1976/04/03 Age: 41 y.o. Gender: male PCP: Fleet ContrasAvbuere, Edwin, MD  Attending physician: Altha HarmMatthews, Ryenn Howeth A, MD  Chief Complaint:Pain in ribs and right wrist x 3 days  History of Present Illness: Opiate tolerant patient with Hb [place] comes in with 3 days of progressively worsening pain in the right ribs and right wrist. Pt describes pain as throbbing and intermittently sharp. He states that this is characteristic of his sickle cell crisis however the wrist is a novel location. He denies any trauma to the wrist and also denies any weakness or sensory disturbance. He denies any fevers, chills, HA, vomiting, CP or dizziness. He has seen his Physician in the last month  Scheduled Meds: .  HYDROmorphone (DILAUDID) injection  2 mg Intravenous Q1H   Or  .  HYDROmorphone (DILAUDID) injection  2 mg Subcutaneous Q1H   Continuous Infusions: . dextrose 5 % and 0.45% NaCl 100 mL/hr at 09/17/16 1226   PRN Meds:.diphenhydrAMINE, ondansetron Allergies: Patient has no known allergies. Past Medical History:  Diagnosis Date  . Avascular necrosis of hip (HCC)    bilateral  . Avascular necrosis of hip, left (HCC) 08/27/2011  . Blood transfusion   . Infection of bone, shoulder region (HCC)    left shoulder  . Pneumonia   . Sickle cell crisis [facility](HCC)    Past Surgical History:  Procedure Laterality Date  . BONE GRAFT HIP ILIAC CREST    . JOINT REPLACEMENT  2006   right total hip arthroplasty  . Orif right hip fracture  1995   Family History  Problem Relation Age of Onset  . Adopted: Yes   Social History   Social History  . Marital status: Single    Spouse name: N/A  . Number of children: N/A  . Years of education: N/A   Occupational History  . Not on file.   Social History Main Topics  . Smoking status: Current Some Day Smoker    Packs/day: 0.50    Types: Cigarettes    Last  attempt to quit: 01/27/2012  . Smokeless tobacco: Never Used  . Alcohol use No  . Drug use: No  . Sexual activity: No   Other Topics Concern  . Not on file   Social History Narrative   On disability.  No assist devices.  No home health services.  Lives with his father.     Review of Systems: Pertinent items noted in HPI and remainder of comprehensive ROS otherwise negative. Physical Exam: No intake or output data in the 24 hours ending 09/17/16 1415   General: Alert, awake, oriented x3, in moderate distress due to pain.  HEENT: [place]/AT PEERL, EOMI, anicteric Neck: Trachea midline,  no masses, no thyromegal,y no JVD, no carotid bruit OROPHARYNX:  Moist, No exudate/ erythema/lesions.  Heart: Regular rate and rhythm, without murmurs, rubs, gallops, PMI non-displaced, no heaves or thrills on palpation.  Lungs: Clear to auscultation, no wheezing or rhonchi noted. No increased vocal fremitus resonant to percussion  Abdomen: Soft, nontender, nondistended, positive bowel sounds, no masses no hepatosplenomegaly noted.  Neuro: No focal neurological deficits noted cranial nerves II through XII grossly intact. Strength at functional baseline in bilateral upper and lower extremities. Musculoskeletal: No warmth swelling or erythema around joints, no spinal tenderness noted. Psychiatric: Patient alert and oriented x3, good insight and cognition, good recent to remote recall.   Lab results:  Recent Labs  09/17/16  1205  NA 140  K 3.8  CL 106  CO2 27  GLUCOSE 86  BUN 17  CREATININE 0.79  CALCIUM 9.2    Recent Labs  09/17/16 1205  AST 22  ALT 12*  ALKPHOS 92  BILITOT 1.9*  PROT 8.2*  ALBUMIN 4.4   No results for input(s): LIPASE, AMYLASE in the last 72 hours.  Recent Labs  09/17/16 1205  WBC 12.5*  NEUTROABS 7.8*  HGB 10.0*  HCT 28.2*  MCV 90.1  PLT 291   No results for input(s): CKTOTAL, CKMB, CKMBINDEX, TROPONINI in the last 72 hours. Invalid input(s): POCBNP No  results for input(s): DDIMER in the last 72 hours. No results for input(s): HGBA1C in the last 72 hours. No results for input(s): CHOL, HDL, LDLCALC, TRIG, CHOLHDL, LDLDIRECT in the last 72 hours. No results for input(s): TSH, T4TOTAL, T3FREE, THYROIDAB in the last 72 hours.  Invalid input(s): FREET3  Recent Labs  09/17/16 1205  RETICCTPCT 2.6   Imaging results:  Dg Chest 2 View  Result Date: 09/17/2016 CLINICAL DATA:  Right chest and rib pain. Patient with sickle cell disease. EXAM: CHEST  2 VIEW COMPARISON:  04/24/2016 and prior exams FINDINGS: Mild cardiomegaly and right lower lung scarring again noted. There is no evidence of focal airspace disease, pulmonary edema, suspicious pulmonary nodule/mass, pleural effusion, or pneumothorax. No acute bony abnormalities are identified. IMPRESSION: No evidence of acute cardiopulmonary disease. Electronically Signed   By: Harmon Pier M.D.   On: 09/17/2016 13:18   Dg Wrist Complete Right  Result Date: 09/17/2016 CLINICAL DATA:  Right wrist pain for several weeks. No known injury. Initial encounter. EXAM: RIGHT WRIST - COMPLETE 3+ VIEW COMPARISON:  None. FINDINGS: There is no evidence of fracture or dislocation. There is no evidence of arthropathy or other focal bone abnormality. Soft tissues are unremarkable. IMPRESSION: Negative. Electronically Signed   By: Harmon Pier M.D.   On: 09/17/2016 13:19   Other results: EKG: normal EKG, normal sinus rhythm, unchanged from previous tracings.   Assessment and Plan: 1. Hb [place] with crisis: Pain will be treated with Dilaudid PCA, Toradol and IVF.  2. Anemia of chronic disease: Hb stable. 3. Chronic Pain Syndrome: Continue MS Contin and Flexaril.   Rowen Hur A. 09/17/2016, 2:15 PM   Time spent during this visit was in excess of 50 minutes. Greater than 50% of time spent in face to face contact, counseling and coordination of care.    Calyn Sivils A.  Pager [phone]. If 7PM-7AM, please  contact night-coverage.

## 2016-09-18 DIAGNOSIS — K5900 Constipation, unspecified: Secondary | ICD-10-CM

## 2016-09-18 DIAGNOSIS — K5903 Drug induced constipation: Secondary | ICD-10-CM | POA: Diagnosis present

## 2016-09-18 MED ORDER — ONDANSETRON HCL 4 MG/2ML IJ SOLN
4.0000 mg | Freq: Four times a day (QID) | INTRAMUSCULAR | Status: DC | PRN
  Administered 2016-09-18 – 2016-09-21 (×2): 4 mg via INTRAVENOUS
  Filled 2016-09-18 (×3): qty 2

## 2016-09-18 MED ORDER — LACTULOSE 10 GM/15ML PO SOLN
30.0000 g | Freq: Every day | ORAL | Status: DC | PRN
  Administered 2016-09-20: 30 g via ORAL
  Filled 2016-09-18: qty 45

## 2016-09-18 MED ORDER — LACTULOSE 10 GM/15ML PO SOLN
30.0000 g | Freq: Once | ORAL | Status: AC
  Administered 2016-09-18: 30 g via ORAL
  Filled 2016-09-18: qty 45

## 2016-09-18 NOTE — Progress Notes (Signed)
SICKLE CELL SERVICE PROGRESS NOTE  [name] BJY:[id number] DOB: 1975/06/27 DOA: 09/17/2016 PCP: Fleet [name], Edwin, MD  Assessment/Plan: Active Problems:   Hb-S/hb-C disease with crisis (HCC)  1. Hb [place] with crisis: Continue PCA and Toradol at current dose. Decrease IVF. 2. Nausea: Pt still having significant nausea despite Phenergan. Will alternate with Compazine instead of Zofran due to the increased cumulative risk of prolonged OTc associated with these medications.  3. Constipation: Pt has not had a BM in several days so will order Lactulose as patients laxative of choice.    Code Status: Full Code Family Communication: N/A Disposition Plan: Not yet ready for discharge  [name] A.  Pager [phone]. If 7PM-7AM, please contact night-coverage.  09/18/2016, 11:26 AM  LOS: 1 day   Interim History: Pt reports pain in wrist almost resolved. Pain in right ribs at intensity of 7-8/10. Pt has used 28.2 mg with 50/47:demans/deliveries since admission (19 hrs). Pt requesting laxative  Consultants:  None  Procedures:  None  Antibiotics:  None    Objective: Vitals:   09/18/16 0358 09/18/16 0524 09/18/16 0742 09/18/16 1052  BP:  106/73  111/70  Pulse:  80  66  Resp: 10 14 14 13   Temp:  98.2 F (36.8 C)  97 F (36.1 C)  TempSrc:  Oral  Oral  SpO2: 92% 97% 93% 96%  Weight:  77.7 kg (171 lb 6.4 oz)    Height:       Weight change:   Intake/Output Summary (Last 24 hours) at 09/18/16 1126 Last data filed at 09/18/16 0525  Gross per 24 hour  Intake            944.2 ml  Output             1000 ml  Net            -55.8 ml       Physical Exam General: Alert, awake, oriented x3, in mild distress.  HEENT: [place]/AT PEERL, EOMI, anicteric OROPHARYNX:  Moist, No exudate/ erythema/lesions.  Heart: Regular rate and rhythm, without murmurs, rubs, gallops, PMI non-displaced, no heaves or thrills on palpation.  Lungs: Clear to auscultation, no wheezing or rhonchi noted.  No increased vocal fremitus resonant to percussion  Abdomen: Soft, nontender, nondistended, positive bowel sounds, no masses no hepatosplenomegaly noted.  Neuro: No focal neurological deficits noted cranial nerves II through XII grossly intact.  Strength at baseline in bilateral upper and lower extremities. Musculoskeletal: No warmth swelling or erythema around joints, no spinal tenderness noted. Psychiatric: Patient alert and oriented x3, good insight and cognition, good recent to remote recall.   Data Reviewed: Basic Metabolic Panel:  Recent Labs Lab 09/17/16 1205  NA 140  K 3.8  CL 106  CO2 27  GLUCOSE 86  BUN 17  CREATININE 0.79  CALCIUM 9.2   Liver Function Tests:  Recent Labs Lab 09/17/16 1205  AST 22  ALT 12*  ALKPHOS 92  BILITOT 1.9*  PROT 8.2*  ALBUMIN 4.4   No results for input(s): LIPASE, AMYLASE in the last 168 hours. No results for input(s): AMMONIA in the last 168 hours. CBC:  Recent Labs Lab 09/17/16 1205  WBC 12.5*  NEUTROABS 7.8*  HGB 10.0*  HCT 28.2*  MCV 90.1  PLT 291   Cardiac Enzymes: No results for input(s): CKTOTAL, CKMB, CKMBINDEX, TROPONINI in the last 168 hours. BNP (last 3 results) No results for input(s): BNP in the last 8760 hours.  ProBNP (last 3 results) No results  for input(s): PROBNP in the last 8760 hours.  CBG: No results for input(s): GLUCAP in the last 168 hours.  No results found for this or any previous visit (from the past 240 hour(s)).   Studies: Dg Chest 2 View  Result Date: 09/17/2016 CLINICAL DATA:  Right chest and rib pain. Patient with sickle cell disease. EXAM: CHEST  2 VIEW COMPARISON:  04/24/2016 and prior exams FINDINGS: Mild cardiomegaly and right lower lung scarring again noted. There is no evidence of focal airspace disease, pulmonary edema, suspicious pulmonary nodule/mass, pleural effusion, or pneumothorax. No acute bony abnormalities are identified. IMPRESSION: No evidence of acute cardiopulmonary  disease. Electronically Signed   By: Harmon Pier M.D.   On: 09/17/2016 13:18   Dg Wrist Complete Right  Result Date: 09/17/2016 CLINICAL DATA:  Right wrist pain for several weeks. No known injury. Initial encounter. EXAM: RIGHT WRIST - COMPLETE 3+ VIEW COMPARISON:  None. FINDINGS: There is no evidence of fracture or dislocation. There is no evidence of arthropathy or other focal bone abnormality. Soft tissues are unremarkable. IMPRESSION: Negative. Electronically Signed   By: Harmon Pier M.D.   On: 09/17/2016 13:19    Scheduled Meds: . cyclobenzaprine  10 mg Oral QHS  . enoxaparin (LOVENOX) injection  40 mg Subcutaneous Q24H  . folic acid  1 mg Oral QPM  . HYDROmorphone   Intravenous Q4H  . ketorolac  30 mg Intravenous Q6H  . morphine  60 mg Oral BID  . senna-docusate  1 tablet Oral BID   Continuous Infusions: . dextrose 5 % and 0.45% NaCl 100 mL/hr at 09/18/16 0718  . diphenhydrAMINE (BENADRYL) IVPB(SICKLE CELL ONLY)      Active Problems:   Hb-S/hb-C disease with crisis (HCC)       In excess of 25 minutes spent during this visit. Greater than 50% involved face to face contact with the patient for assessment, counseling and coordination of care.

## 2016-09-19 LAB — BASIC METABOLIC PANEL WITH GFR
Anion gap: 6 (ref 5–15)
BUN: 11 mg/dL (ref 6–20)
CO2: 28 mmol/L (ref 22–32)
Calcium: 8.9 mg/dL (ref 8.9–10.3)
Chloride: 106 mmol/L (ref 101–111)
Creatinine, Ser: 0.73 mg/dL (ref 0.61–1.24)
GFR calc Af Amer: >60 mL/min
GFR calc non Af Amer: >60 mL/min
Glucose, Bld: 100 mg/dL — ABNORMAL HIGH (ref 65–99)
Potassium: 3.9 mmol/L (ref 3.5–5.1)
Sodium: 140 mmol/L (ref 135–145)

## 2016-09-19 LAB — CBC WITH DIFFERENTIAL/PLATELET
BASOS ABS: 0.1 10*3/uL (ref 0.0–0.1)
Basophils Relative: 1 %
Eosinophils Absolute: 1 10*3/uL — ABNORMAL HIGH (ref 0.0–0.7)
Eosinophils Relative: 8 %
HEMATOCRIT: 25.5 % — AB (ref 39.0–52.0)
HEMOGLOBIN: 9.1 g/dL — AB (ref 13.0–17.0)
LYMPHS ABS: 2.7 10*3/uL (ref 0.7–4.0)
LYMPHS PCT: 21 %
MCH: 32 pg (ref 26.0–34.0)
MCHC: 35.7 g/dL (ref 30.0–36.0)
MCV: 89.8 fL (ref 78.0–100.0)
MONOS PCT: 7 %
Monocytes Absolute: 0.9 10*3/uL (ref 0.1–1.0)
NEUTROS PCT: 63 %
Neutro Abs: 8 10*3/uL — ABNORMAL HIGH (ref 1.7–7.7)
Platelets: 256 10*3/uL (ref 150–400)
RBC: 2.84 MIL/uL — AB (ref 4.22–5.81)
RDW: 14.1 % (ref 11.5–15.5)
WBC: 12.7 10*3/uL — AB (ref 4.0–10.5)

## 2016-09-19 MED ORDER — MAGNESIUM CITRATE PO SOLN
1.0000 | Freq: Once | ORAL | Status: AC
  Administered 2016-09-19: 1 via ORAL
  Filled 2016-09-19: qty 296

## 2016-09-19 NOTE — Progress Notes (Signed)
Subjective: 41 year old gentleman admitted with sickle cell crisis. Patient is currently on Dilaudid PCA with Toradol and IV fluids. He has used 43.2 mg of the Dilaudid with 83 demands and 72 deliveries in the last 24 hours. Pain is currently a 7 out of 2 in his back and legs. He denied any nausea vomiting or diarrhea denied any other complaints. He still not very functional  Objective: Vital signs in last 24 hours: Temp:  [98 F (36.7 C)-98.8 F (37.1 C)] 98.4 F (36.9 C) (05/26 1844) Pulse Rate:  [57-88] 74 (05/26 1844) Resp:  [9-16] 16 (05/26 1844) BP: (101-131)/(58-87) 117/64 (05/26 1844) SpO2:  [91 %-97 %] 94 % (05/26 1844) Weight:  [80.7 kg (177 lb 14.6 oz)] 80.7 kg (177 lb 14.6 oz) (05/26 0515) Weight change: 3.589 kg (7 lb 14.6 oz) Last BM Date: 09/18/16  Intake/Output from previous day: 05/25 0701 - 05/26 0700 In: 3585.4 [P.O.:1300; I.V.:2285.4] Out: 2301 [Urine:2300; Stool:1] Intake/Output this shift: Total I/O In: 480 [P.O.:480] Out: 1500 [Urine:1500]  General appearance: alert, cooperative, appears stated age and no distress Back: symmetric, no curvature. ROM normal. No CVA tenderness. Resp: clear to auscultation bilaterally Chest wall: no tenderness Cardio: regular rate and rhythm, S1, S2 normal, no murmur, click, rub or gallop GI: soft, non-tender; bowel sounds normal; no masses,  no organomegaly Extremities: extremities normal, atraumatic, no cyanosis or edema Pulses: 2+ and symmetric Skin: Skin color, texture, turgor normal. No rashes or lesions Neurologic: Grossly normal  Lab Results:  Recent Labs  09/17/16 1205 09/19/16 0431  WBC 12.5* 12.7*  HGB 10.0* 9.1*  HCT 28.2* 25.5*  PLT 291 256   BMET  Recent Labs  09/17/16 1205 09/19/16 0431  NA 140 140  K 3.8 3.9  CL 106 106  CO2 27 28  GLUCOSE 86 100*  BUN 17 11  CREATININE 0.79 0.73  CALCIUM 9.2 8.9    Studies/Results: No results found.  Medications: I have reviewed the patient's  current medications.  Assessment/Plan: A 41 year old gentleman admitted with sickle cell S/HbC with crisis.  #1 sickle cell painful crisis: Patient appears to be responding to current regimen. I will keep her on the PCA with Toradol and IV fluids. His long-acting home medications will be maintain. Patient will be mobilized today. Depending on how he does we will consider discharge in the next 2448 hrs.  #2 leukocytosis: Most likely due to sickle cell vasoactive crisis. Continue close management.  #3 transient nausea: This appeared to have resolved. Patient is able to eat and drink now.  #4 constipation: Patient has had one bowel movement so far. Continue lactulose.   LOS: 2 days   Nicolina Hirt,[name] 09/19/2016, 6:56 PM

## 2016-09-20 NOTE — Progress Notes (Signed)
Subjective: Patient still having problem with bowel movement. He is severely constipated. He has tried 2 different laxatives but still not able to have bowel movement. He is on Dilaudid PCA and used 34.8 mg with 63 demands and 59 deliveries in the last 24 hours. No NVD  Objective: Vital signs in last 24 hours: Temp:  [97.7 F (36.5 C)-98.8 F (37.1 C)] 97.7 F (36.5 C) (05/27 0446) Pulse Rate:  [64-88] 65 (05/27 0446) Resp:  [7-16] 11 (05/27 0446) BP: (101-122)/(58-78) 106/65 (05/27 0446) SpO2:  [91 %-97 %] 95 % (05/27 0446) Weight:  [82.3 kg (181 lb 7 oz)] 82.3 kg (181 lb 7 oz) (05/27 0446) Weight change: 1.6 kg (3 lb 8.4 oz) Last BM Date: 09/18/16  Intake/Output from previous day: 05/26 0701 - 05/27 0700 In: 1240 [P.O.:1240] Out: 2900 [Urine:2900] Intake/Output this shift: No intake/output data recorded.  General appearance: alert, cooperative, appears stated age and no distress Back: symmetric, no curvature. ROM normal. No CVA tenderness. Resp: clear to auscultation bilaterally Chest wall: no tenderness Cardio: regular rate and rhythm, S1, S2 normal, no murmur, click, rub or gallop GI: soft, non-tender; bowel sounds normal; no masses,  no organomegaly Extremities: extremities normal, atraumatic, no cyanosis or edema Pulses: 2+ and symmetric Skin: Skin color, texture, turgor normal. No rashes or lesions Neurologic: Grossly normal  Lab Results:  Recent Labs  09/17/16 1205 09/19/16 0431  WBC 12.5* 12.7*  HGB 10.0* 9.1*  HCT 28.2* 25.5*  PLT 291 256   BMET  Recent Labs  09/17/16 1205 09/19/16 0431  NA 140 140  K 3.8 3.9  CL 106 106  CO2 27 28  GLUCOSE 86 100*  BUN 17 11  CREATININE 0.79 0.73  CALCIUM 9.2 8.9    Studies/Results: No results found.  Medications: I have reviewed the patient's current medications.  Assessment/Plan: A 41 year old gentleman admitted with sickle cell S/HbC with crisis.  #1 sickle cell painful crisis: Patient has pain at  5/10 and doing better. He appears to be responding to current regimen. I will keep her on the PCA with Toradol and IV fluids. His long-acting home medications will be maintained.  #2 leukocytosis: Most likely due to sickle cell vasoactive crisis. Continue close management and recheck in am.  #3 transient nausea: This appeared to have resolved. Patient is able to eat and drink now.  #4 constipation: Patient has had no bowel movement so far. Has tried lactulose and Magnesium citrate. We will try again. [facility].   LOS: 3 days   Britta Louth,[name] 09/20/2016, 7:50 AM

## 2016-09-21 DIAGNOSIS — K5903 Drug induced constipation: Secondary | ICD-10-CM

## 2016-09-21 DIAGNOSIS — T402X5A Adverse effect of other opioids, initial encounter: Secondary | ICD-10-CM

## 2016-09-21 LAB — RETICULOCYTES
RBC.: 3 MIL/uL — ABNORMAL LOW (ref 4.22–5.81)
RETIC COUNT ABSOLUTE: 120 10*3/uL (ref 19.0–186.0)
RETIC CT PCT: 4 % — AB (ref 0.4–3.1)

## 2016-09-21 LAB — CBC WITH DIFFERENTIAL/PLATELET
BASOS ABS: 0.1 10*3/uL (ref 0.0–0.1)
Basophils Relative: 1 %
EOS ABS: 1.4 10*3/uL — AB (ref 0.0–0.7)
EOS PCT: 12 %
HCT: 26.9 % — ABNORMAL LOW (ref 39.0–52.0)
Hemoglobin: 9.7 g/dL — ABNORMAL LOW (ref 13.0–17.0)
LYMPHS ABS: 2.2 10*3/uL (ref 0.7–4.0)
Lymphocytes Relative: 19 %
MCH: 32.3 pg (ref 26.0–34.0)
MCHC: 36.1 g/dL — ABNORMAL HIGH (ref 30.0–36.0)
MCV: 89.7 fL (ref 78.0–100.0)
MONO ABS: 1 10*3/uL (ref 0.1–1.0)
Monocytes Relative: 9 %
NEUTROS PCT: 59 %
Neutro Abs: 6.9 10*3/uL (ref 1.7–7.7)
PLATELETS: 273 10*3/uL (ref 150–400)
RBC: 3 MIL/uL — ABNORMAL LOW (ref 4.22–5.81)
RDW: 14.5 % (ref 11.5–15.5)
WBC: 11.6 10*3/uL — AB (ref 4.0–10.5)

## 2016-09-21 LAB — BASIC METABOLIC PANEL
Anion gap: 8 (ref 5–15)
BUN: 10 mg/dL (ref 6–20)
CO2: 27 mmol/L (ref 22–32)
Calcium: 9.1 mg/dL (ref 8.9–10.3)
Chloride: 104 mmol/L (ref 101–111)
Creatinine, Ser: 0.73 mg/dL (ref 0.61–1.24)
GFR calc Af Amer: >60 mL/min (ref >60)
GLUCOSE: 117 mg/dL — AB (ref 65–99)
POTASSIUM: 3.7 mmol/L (ref 3.5–5.1)
SODIUM: 139 mmol/L (ref 135–145)

## 2016-09-21 NOTE — Discharge Summary (Signed)
Malik PortHoward E Borgwardt MRN: [id number] DOB/AGE: 11/14/1975 41 y.o.  Admit date: 09/17/2016 Discharge date: 09/21/2016  Primary Care Physician:  Fleet ContrasAvbuere, Edwin, MD   Discharge Diagnoses:   Patient Active Problem List   Diagnosis Date Noted  . Hb-S/hb-C disease with crisis (HCC) 09/17/2016  . Sickle cell anemia (HCC) 08/13/2015  . Sickle cell disease, type [place] (HCC) 10/18/2012  . History of tobacco abuse 10/13/2012  . Anemia 11/08/2011  . Avascular necrosis of hip, left (HCC) 08/27/2011    DISCHARGE MEDICATION: Allergies as of 09/21/2016   No Known Allergies     Medication List    TAKE these medications   cyclobenzaprine 10 MG tablet Commonly known as:  FLEXERIL Take 10 mg by mouth at bedtime.   folic acid 1 MG tablet Commonly known as:  FOLVITE Take 1 mg by mouth every evening.   ibuprofen 800 MG tablet Commonly known as:  ADVIL,MOTRIN Take 800 mg by mouth 3 (three) times daily.   morphine 60 MG 12 hr tablet Commonly known as:  MS CONTIN Take 1 tablet (60 mg total) by mouth 2 (two) times daily.   morphine 30 MG tablet Commonly known as:  MSIR Take 1 tablet (30 mg total) by mouth daily as needed for severe pain (pain). Resume after completing dilaudid.   promethazine 25 MG tablet Commonly known as:  PHENERGAN Take 25 mg by mouth every 6 (six) hours as needed for nausea (nausea).         Consults:    SIGNIFICANT DIAGNOSTIC STUDIES:  Dg Chest 2 View  Result Date: 09/17/2016 CLINICAL DATA:  Right chest and rib pain. Patient with sickle cell disease. EXAM: CHEST  2 VIEW COMPARISON:  04/24/2016 and prior exams FINDINGS: Mild cardiomegaly and right lower lung scarring again noted. There is no evidence of focal airspace disease, pulmonary edema, suspicious pulmonary nodule/mass, pleural effusion, or pneumothorax. No acute bony abnormalities are identified. IMPRESSION: No evidence of acute cardiopulmonary disease. Electronically Signed   By: Harmon PierJeffrey  Hu M.D.   On: 09/17/2016  13:18   Dg Wrist Complete Right  Result Date: 09/17/2016 CLINICAL DATA:  Right wrist pain for several weeks. No known injury. Initial encounter. EXAM: RIGHT WRIST - COMPLETE 3+ VIEW COMPARISON:  None. FINDINGS: There is no evidence of fracture or dislocation. There is no evidence of arthropathy or other focal bone abnormality. Soft tissues are unremarkable. IMPRESSION: Negative. Electronically Signed   By: Harmon PierJeffrey  Hu M.D.   On: 09/17/2016 13:19      No results found for this or any previous visit (from the past 240 hour(s)).  BRIEF ADMITTING H & P: Opiate tolerant patient with Hb [place] comes in with 3 days of progressively worsening pain in the right ribs and right wrist. Pt describes pain as throbbing and intermittently sharp. He states that this is characteristic of his sickle cell crisis however the wrist is a novel location. He denies any trauma to the wrist and also denies any weakness or sensory disturbance. He denies any fevers, chills, HA, vomiting, CP or dizziness. He has seen his Physician in the last month   Hospital Course:  Present on Admission: . Hb-S/hb-C disease with crisis (HCC)  Pt was admitted with sickle cell crisis. Pain was managed with IV Dilaudid via PCA, Toradol and IVF. As the pain improved transition was made to MS IR and PCA weaned.. MS contin was also continued for management of chronic pain. At the time of discharge his pain was 4/10 and he is ambulatory  for community distances without any difficulty. His hospital course was prolonged by severe obstipation leading to nausea and inability to tolerate oral intake of food. Obstipation was treated with gut motility agent, stool softener, and multiple laxatives. He had a large stool and is now able to tolerate oral intake without difficulty. I have advised that he be started on Movantik as an out patient for Opioid induced constipation.     Disposition and Follow-up: Pt is discharged home in good condition and is to  follow up with his PMD as scheduled.    Discharge Instructions    Activity as tolerated - No restrictions    Complete by:  As directed       DISCHARGE EXAM:  General: Alert, awake, oriented x3, in no acute distress. Well appearing.  HEENT: [place]/AT PEERL, EOMI, anicteric Neck: Trachea midline, no masses, no thyromegal,y no JVD, no carotid bruit OROPHARYNX: Moist, No exudate/ erythema/lesions.  Heart: Regular rate and rhythm, without murmurs, rubs, gallops or S3. PMI non-displaced. Exam reveals no decreased pulses. Pulmonary/Chest: Normal effort. Breath sounds normal. No. Apnea. Clear to auscultation,no stridor,  no wheezing and no rhonchi noted. No respiratory distress and no tenderness noted. Abdomen: Soft, nontender, nondistended, normal bowel sounds, no masses no hepatosplenomegaly noted. No fluid wave and no ascites. There is no guarding or rebound. Neuro: Alert and oriented to person, place and time. Normal motor skills, Displays no atrophy or tremors and exhibits normal muscle tone.  No focal neurological deficits noted cranial nerves II through XII grossly intact. No sensory deficit noted.  Strength at baseline in bilateral upper and lower extremities. Gait normal. Musculoskeletal: No warm swelling or erythema around joints, no spinal tenderness noted. Psychiatric: Patient alert and oriented x3, good insight and cognition, good recent to remote recall. Mood, affect and judgement normal Skin: Skin is warm and dry. No bruising, no ecchymosis and no rash noted. Pt is not diaphoretic. No erythema. No pallor     Blood pressure (!) 134/97, pulse 90, temperature 97.3 F (36.3 C), temperature source Oral, resp. rate 16, height 6\' 2"  (1.88 m), weight 82.3 kg (181 lb 7 oz), SpO2 98 %.   Recent Labs  09/19/16 0431 09/21/16 1032  NA 140 139  K 3.9 3.7  CL 106 104  CO2 28 27  GLUCOSE 100* 117*  BUN 11 10  CREATININE 0.73 0.73  CALCIUM 8.9 9.1   No results for input(s): AST, ALT,  ALKPHOS, BILITOT, PROT, ALBUMIN in the last 72 hours. No results for input(s): LIPASE, AMYLASE in the last 72 hours.  Recent Labs  09/19/16 0431 09/21/16 1032  WBC 12.7* 11.6*  NEUTROABS 8.0* PENDING  HGB 9.1* 9.7*  HCT 25.5* 26.9*  MCV 89.8 89.7  PLT 256 PENDING     Total time spent including face to face and decision making was greater than 30 minutes  Signed: [place]a Phillippi A. 09/21/2016, 11:21 AM

## 2016-09-21 NOTE — Progress Notes (Signed)
Discharge instructions explained to pt, no scripts given. Pt's dad in to take pt home. Discharge via wheelchair. IV dc'd

## 2016-09-21 NOTE — Progress Notes (Signed)
Date: Sep 21, 2016  Discharge orders review for case management needs.  None found  Rhonda Davis, BSN, RN3, CCM:  [phone] 

## 2016-09-23 ENCOUNTER — Encounter (HOSPITAL_COMMUNITY): Payer: Self-pay | Admitting: *Deleted

## 2016-09-23 ENCOUNTER — Non-Acute Institutional Stay (HOSPITAL_COMMUNITY)
Admission: AD | Admit: 2016-09-23 | Discharge: 2016-09-23 | Disposition: A | Discharge status: Home / Self Care | Payer: Medicare Other | Source: Ambulatory Visit | Attending: Internal Medicine | Admitting: Internal Medicine

## 2016-09-23 ENCOUNTER — Emergency Department (HOSPITAL_COMMUNITY)
Admission: EM | Admit: 2016-09-23 | Discharge: 2016-09-23 | Disposition: A | Discharge status: Other Acute Inpatient Hospital | Payer: Medicare Other | Source: Home / Self Care | Attending: Emergency Medicine | Admitting: Emergency Medicine

## 2016-09-23 DIAGNOSIS — D5700 Hb-SS disease with crisis, unspecified: Secondary | ICD-10-CM | POA: Diagnosis not present

## 2016-09-23 DIAGNOSIS — Z79899 Other long term (current) drug therapy: Secondary | ICD-10-CM | POA: Insufficient documentation

## 2016-09-23 DIAGNOSIS — F17210 Nicotine dependence, cigarettes, uncomplicated: Secondary | ICD-10-CM | POA: Insufficient documentation

## 2016-09-23 DIAGNOSIS — Z96641 Presence of right artificial hip joint: Secondary | ICD-10-CM | POA: Insufficient documentation

## 2016-09-23 MED ORDER — HYDROMORPHONE HCL 4 MG/ML IJ SOLN
2.0000 mg | Freq: Once | INTRAMUSCULAR | Status: AC
  Administered 2016-09-23: 2 mg via INTRAVENOUS
  Filled 2016-09-23: qty 1

## 2016-09-23 MED ORDER — MORPHINE SULFATE 30 MG PO TABS
30.0000 mg | ORAL_TABLET | Freq: Once | ORAL | Status: AC
  Administered 2016-09-23: 30 mg via ORAL
  Filled 2016-09-23: qty 1

## 2016-09-23 MED ORDER — ONDANSETRON HCL 4 MG/2ML IJ SOLN
4.0000 mg | Freq: Once | INTRAMUSCULAR | Status: AC
  Administered 2016-09-23: 4 mg via INTRAVENOUS
  Filled 2016-09-23: qty 2

## 2016-09-23 MED ORDER — KETOROLAC TROMETHAMINE 30 MG/ML IJ SOLN
30.0000 mg | Freq: Once | INTRAMUSCULAR | Status: AC
  Administered 2016-09-23: 30 mg via INTRAVENOUS
  Filled 2016-09-23: qty 1

## 2016-09-23 MED ORDER — DEXTROSE-NACL 5-0.45 % IV SOLN
INTRAVENOUS | Status: DC
  Administered 2016-09-23: 14:00:00 via INTRAVENOUS

## 2016-09-23 NOTE — Discharge Instructions (Signed)
Please follow up at the sickle cell clinic after discharge for close management.

## 2016-09-23 NOTE — Discharge Instructions (Signed)
Follow up with primary provider on September 29, 2016 at 4: 00.

## 2016-09-23 NOTE — ED Triage Notes (Addendum)
Pt c/o pain in L hip. Pt states sickle cell pain crisis. Pain 10/10. No distress.

## 2016-09-23 NOTE — ED Provider Notes (Signed)
[facility]-EMERGENCY DEPT Provider Note   CSN: [id number] Arrival date & time: 09/23/16  [id number]     History   Chief Complaint Chief Complaint  Patient presents with  . Hip Pain  . Sickle Cell Pain Crisis    HPI [name] is a 41 y.o. male.  HPI   This is a 41 year old opiate tolerance patient with history of sickle cell types Hb-S/hb-C disease presents with complaints of sickle cell related pain. Pt was recently hospitalized for 3 days for sickle cell related pain and was discharged 2 days ago.  He was initially treated for ribs and wrist pain during last crisis.  At discharge he report mild L Hip pain which was tolerable. Since been discharged he noticed that his pain in his left hip has become increasingly more tender. Describe pain as a sharp achy 10 out of 10 pain, nonradiating, difficult to emulate. Right wrist pain still remains and rated as 5 out of 10. Pain was intense last night, he took double the dose of morphine to help him sleep. Denies any associated fever, chills, shortness of breath, productive cough, back pain, bowel bladder incontinence, saddle anesthesia, or rash. No report of nausea vomiting diarrhea. Patient is currently not taking hydroxyurea, states that he doesn't normally have crisis therefore he stopped refilling it. He denies any specific triggers causing his symptoms. Denies any recent drugs or alcohol use. Denies any new numbness or weakness.   Past Medical History:  Diagnosis Date  . Avascular necrosis of hip (HCC)    bilateral  . Avascular necrosis of hip, left (HCC) 08/27/2011  . Blood transfusion   . Infection of bone, shoulder region (HCC)    left shoulder  . Pneumonia   . Sickle cell crisis [facility](HCC)     Patient Active Problem List   Diagnosis Date Noted  . Sickle cell anemia (HCC) 08/13/2015  . Sickle cell disease, type [place] (HCC) 10/18/2012  . History of tobacco abuse 10/13/2012  . Anemia 11/08/2011  . Avascular necrosis of hip, left (HCC)  08/27/2011    Past Surgical History:  Procedure Laterality Date  . BONE GRAFT HIP ILIAC CREST    . JOINT REPLACEMENT  2006   right total hip arthroplasty  . Orif right hip fracture  1995       Home Medications    Prior to Admission medications   Medication Sig Start Date End Date Taking? Authorizing Provider  cyclobenzaprine (FLEXERIL) 10 MG tablet Take 10 mg by mouth at bedtime.    [provider]  folic acid (FOLVITE) 1 MG tablet Take 1 mg by mouth every evening.     [provider]  ibuprofen (ADVIL,MOTRIN) 800 MG tablet Take 800 mg by mouth 3 (three) times daily. 09/07/16   [provider]  morphine (MS CONTIN) 60 MG 12 hr tablet Take 1 tablet (60 mg total) by mouth 2 (two) times daily. 11/20/11   Grayce SessionsEdwards, Michelle P, NP  morphine (MSIR) 30 MG tablet Take 1 tablet (30 mg total) by mouth daily as needed for severe pain (pain). Resume after completing dilaudid. 02/17/16   Altha HarmMatthews, Michelle A, MD  promethazine (PHENERGAN) 25 MG tablet Take 25 mg by mouth every 6 (six) hours as needed for nausea (nausea).     [provider]    Family History Family History  Problem Relation Age of Onset  . Adopted: Yes    Social History Social History  Substance Use Topics  . Smoking status: Current Some Day  Smoker    Packs/day: 0.50    Types: Cigarettes    Last attempt to quit: 01/27/2012  . Smokeless tobacco: Never Used  . Alcohol use No     Allergies   Patient has no known allergies.   Review of Systems Review of Systems  All other systems reviewed and are negative.    Physical Exam Updated Vital Signs BP 120/84 (BP Location: Left Arm)   Pulse (!) 112   Temp 98.1 F (36.7 C) (Oral)   Resp 16   Ht 6\' 2"  (1.88 m)   Wt 77.1 kg (170 lb)   SpO2 96%   BMI 21.83 kg/m   Physical Exam  Constitutional: He appears well-developed and well-nourished. No distress.  HENT:  Head: Atraumatic.  Eyes: Conjunctivae are normal.  Neck: Neck  supple.  Cardiovascular: Normal rate, regular rhythm and intact distal pulses.   Pulmonary/Chest: Effort normal and breath sounds normal.  Abdominal: Soft. He exhibits no distension. There is no tenderness.  Musculoskeletal: He exhibits tenderness (Mild generalized tenderness to left hip with full range of motion and no deformity noted.).  Neurological: He is alert.  Skin: No rash noted.  Psychiatric: He has a normal mood and affect.  Nursing note and vitals reviewed.    ED Treatments / Results  Labs (all labs ordered are listed, but only abnormal results are displayed) Labs Reviewed - No data to display  EKG  EKG Interpretation None       Radiology No results found.  Procedures Procedures (including critical care time)  Medications Ordered in ED Medications - No data to display   Initial Impression / Assessment and Plan / ED Course  I have reviewed the triage vital signs and the nursing notes.  Pertinent labs & imaging results that were available during my care of the patient were reviewed by me and considered in my medical decision making (see chart for details).     BP 127/82   Pulse 85   Temp 98.1 F (36.7 C) (Oral)   Resp 13   Ht 6\' 2"  (1.88 m)   Wt 77.1 kg (170 lb)   SpO2 96%   BMI 21.83 kg/m    Final Clinical Impressions(s) / ED Diagnoses   Final diagnoses:  Sickle cell crisis (HCC)    New Prescriptions New Prescriptions   No medications on file   11:40 AM Pt here with sickle cell related pain affecting L hip and R ribs.  Was admitted recently for similar sickle cell related pain.  No sxs to suggest acute chest.  Does have hx of avascular necrosis 2/2 sickle cell disease.  Pt is well appearing.  Will reach out to sickle cell clinic to request assistance with management of his sickle cell related pain.    12:01 PM Patient able to cannulate without assistance. He is a good candidate for sickle cell pain. I have discussed over the phone with  sickle cell specialist, Chyna who agrees to accept patient.   Fayrene Helper, PA-C 09/23/16 1202    Cathren Laine, MD 09/24/16 [phone]

## 2016-09-23 NOTE — Progress Notes (Signed)
Patient received [facility] from the ER c/o left hip pain and rates it 10/10 on pain scale. Patient was treated with IV fluids, IV Toradol and Dilaudid pushes x3. Patient also received 30 mg of Morphine po. At time of discharge patient rated pain a 8/10 on pain scale. Discharge instructions given to patient and patient states an understanding. Patient alert, oriented, and ambulatory to wheelchair at time of discharge.

## 2016-09-23 NOTE — Discharge Summary (Signed)
[facility] Discharge Summary   Patient ID: [name] MRN: [id number] DOB/AGE: June 12, 1975 41 y.o.  Admit date: 09/23/2016 Discharge date: 09/23/2016  Primary Care Physician:  Fleet ContrasAvbuere, Edwin, MD  Admission Diagnoses:  Active Problems:   Sickle cell pain crisis [facility](HCC)  Discharge Medications:  Allergies as of 09/23/2016   No Known Allergies     Medication List    TAKE these medications   cyclobenzaprine 10 MG tablet Commonly known as:  FLEXERIL Take 10 mg by mouth at bedtime.   folic acid 1 MG tablet Commonly known as:  FOLVITE Take 1 mg by mouth every evening.   ibuprofen 800 MG tablet Commonly known as:  ADVIL,MOTRIN Take 800 mg by mouth 3 (three) times daily.   morphine 60 MG 12 hr tablet Commonly known as:  MS CONTIN Take 1 tablet (60 mg total) by mouth 2 (two) times daily.   morphine 30 MG tablet Commonly known as:  MSIR Take 1 tablet (30 mg total) by mouth daily as needed for severe pain (pain). Resume after completing dilaudid.   promethazine 25 MG tablet Commonly known as:  PHENERGAN Take 25 mg by mouth every 6 (six) hours as needed for nausea (nausea).        Consults:  None  Significant Diagnostic Studies:  Dg Chest 2 View  Result Date: 09/17/2016 CLINICAL DATA:  Right chest and rib pain. Patient with sickle cell disease. EXAM: CHEST  2 VIEW COMPARISON:  04/24/2016 and prior exams FINDINGS: Mild cardiomegaly and right lower lung scarring again noted. There is no evidence of focal airspace disease, pulmonary edema, suspicious pulmonary nodule/mass, pleural effusion, or pneumothorax. No acute bony abnormalities are identified. IMPRESSION: No evidence of acute cardiopulmonary disease. Electronically Signed   By: Harmon PierJeffrey  Hu M.D.   On: 09/17/2016 13:18   Dg Wrist Complete Right  Result Date: 09/17/2016 CLINICAL DATA:  Right wrist pain for several weeks. No known injury. Initial encounter. EXAM: RIGHT WRIST - COMPLETE 3+ VIEW COMPARISON:   None. FINDINGS: There is no evidence of fracture or dislocation. There is no evidence of arthropathy or other focal bone abnormality. Soft tissues are unremarkable. IMPRESSION: Negative. Electronically Signed   By: Harmon PierJeffrey  Hu M.D.   On: 09/17/2016 13:19     [facility] Course: Patient transitioned to the day infusion center from the emergency department.  Reviewed labs, mild leukocytosis. WBC count trending down over the past several days.  Patient is hemodynamically stable.   Pain management:    D5.45 @ 100 for cellular rehydration Toradol 30 mg IV for inflammation Received 6 mg of Dilaudid IV via clinician assisted doses.  Pain intensity decreased from 10/10 to 8/10 prior to discharge. Patient is alert, oriented and ambulating.  Will discharge home in stable condition  Discharge Instructions:  Contacted Dr. Fleet ContrasEdwin Avbuere, primary provider. Scheduled appointment for June 5th at 4pm for evaluation of left hip pain.  Resume all home medications   The patient was given clear instructions to go to ER or return to medical center if symptoms do not improve, worsen or new problems develop. The patient verbalized understanding.      Physical Exam at Discharge:  BP 133/89 (BP Location: Right Arm, Patient Position: Lying right side)   Pulse 94   Temp 97.8 F (36.6 C) (Oral)   Resp 18   Ht 6\' 2"  (1.88 m)   Wt 170 lb (77.1 kg)   SpO2 96%   BMI 21.83 kg/m  Physical Exam  HENT:  Head: Normocephalic.  Right Ear: External ear normal.  Mouth/Throat: Oropharynx is clear and moist.  Cardiovascular: Normal rate, regular rhythm, normal heart sounds and intact distal pulses.   Pulmonary/Chest: Effort normal and breath sounds normal.  Abdominal: Soft. Bowel sounds are normal.  Musculoskeletal:  Continues to complain of left hip pain. Decreased ROM     Disposition at Discharge: 30-Still a Patient  Discharge Orders:   Condition at Discharge:   Stable  Time spent on  Discharge:  Greater than 30 minutes.  Signed: Dhrithi Riche M 09/23/2016, 2:13 PM

## 2016-09-23 NOTE — H&P (Signed)
[facility] History and Physical   Date: 09/23/2016  Patient name: [name] Medical record number: [id number] Date of birth: Aug 06, 1975 Age: 41 y.o. Gender: male PCP: Fleet ContrasAvbuere, Edwin, MD  Attending physician: Quentin AngstJegede, Olugbemiga E, MD  Chief Complaint: Left Hip Danielle DessPai History of Present Illness: [name], a 41 year old male with a history of sickle cell anemia presents to the day infusion center complaining of pain to left hip. He says that pain has been present since inpatient discharge on 09/17/2016. He says that hip pain has been worsening over the past several days. He has a history of avascular necrosis. He is a patient of Dr. Alveta HeimlichEdwin Avebuere and is typically followed every 3 months. He has had frequent emergency room visits over the past several days. I spoke with Malik HelperBowie Tran,  PA in the emergency department this am, who was unsure whether patient was ambulatory. He was unsure whether the patient was able to ambulate without assistance.   [name] transitioned from the emergency room for pain management in stable condition. He was transported via wheelchair. He says that hip pain worsens with weight bearing. He did not receive any treatment in the ED prior to arrival.   He last had home opiate medications early this am without sustained relief. He describes hip pain as intermittent and aching. He denies chest pain, shortness of breath, abdominal pain, nausea, vomiting, or diarrhea.  Meds: Prescriptions Prior to Admission  Medication Sig Dispense Refill Last Dose  . cyclobenzaprine (FLEXERIL) 10 MG tablet Take 10 mg by mouth at bedtime.   09/22/2016 at Unknown time  . folic acid (FOLVITE) 1 MG tablet Take 1 mg by mouth every evening.    09/22/2016 at Unknown time  . ibuprofen (ADVIL,MOTRIN) 800 MG tablet Take 800 mg by mouth 3 (three) times daily.  5 09/22/2016 at Unknown time  . morphine (MS CONTIN) 60 MG 12 hr tablet Take 1 tablet (60 mg total) by mouth 2 (two) times  daily. 40 tablet 0 09/22/2016 at Unknown time  . morphine (MSIR) 30 MG tablet Take 1 tablet (30 mg total) by mouth daily as needed for severe pain (pain). Resume after completing dilaudid. 30 tablet 0 09/22/2016 at Unknown time  . promethazine (PHENERGAN) 25 MG tablet Take 25 mg by mouth every 6 (six) hours as needed for nausea (nausea).    09/22/2016 at Unknown time    Allergies: Patient has no known allergies. Past Medical History:  Diagnosis Date  . Avascular necrosis of hip (HCC)    bilateral  . Avascular necrosis of hip, left (HCC) 08/27/2011  . Blood transfusion   . Infection of bone, shoulder region (HCC)    left shoulder  . Pneumonia   . Sickle cell crisis [facility](HCC)    Past Surgical History:  Procedure Laterality Date  . BONE GRAFT HIP ILIAC CREST    . JOINT REPLACEMENT  2006   right total hip arthroplasty  . Orif right hip fracture  1995   Family History  Problem Relation Age of Onset  . Adopted: Yes   Social History   Social History  . Marital status: Single    Spouse name: N/A  . Number of children: N/A  . Years of education: N/A   Occupational History  . Not on file.   Social History Main Topics  . Smoking status: Current Some Day Smoker    Packs/day: 0.50    Types: Cigarettes    Last attempt to quit:  01/27/2012  . Smokeless tobacco: Never Used  . Alcohol use No  . Drug use: No  . Sexual activity: Yes   Other Topics Concern  . Not on file   Social History Narrative   On disability.  No assist devices.  No home health services.  Lives with his father.      Review of Systems: Review of Systems  Constitutional: Negative.  Negative for fever.  HENT: Negative.   Eyes: Negative.   Respiratory: Negative.   Cardiovascular: Negative.  Negative for chest pain, palpitations and orthopnea.  Gastrointestinal: Negative.   Genitourinary: Negative.   Musculoskeletal: Positive for joint pain (left hip).       Unable to apply full weight.   Skin: Negative.    Neurological: Negative.  Negative for dizziness, sensory change, speech change and focal weakness.  Psychiatric/Behavioral: Negative.      Physical Exam: There were no vitals taken for this visit.  Physical Exam  Constitutional: He appears well-developed and well-nourished.  HENT:  Head: Normocephalic and atraumatic.  Right Ear: External ear normal.  Left Ear: External ear normal.  Mouth/Throat: Oropharynx is clear and moist.  Eyes: Conjunctivae and EOM are normal. Pupils are equal, round, and reactive to light.  Neck: Normal range of motion. Neck supple.  Cardiovascular: Normal rate, normal heart sounds and intact distal pulses.   Pulmonary/Chest: Effort normal and breath sounds normal.  Abdominal: Soft. Bowel sounds are normal.  Musculoskeletal:       Left hip: He exhibits decreased range of motion and decreased strength. He exhibits no swelling.  Skin: Skin is warm and dry.    Lab results: No results found for this or any previous visit (from the past 24 hour(s)).  Imaging results:  No results found.   Assessment & Plan:  Patient will be admitted to the day infusion center for extended observation  Start IV D5.45 for cellular rehydration at 100/hr  Start Toradol 30 mg IV times one for inflammation.  Start Dilaudid IV per clinician assisted doses  Patient will be re-evaluated for pain intensity in the context of function and relationship to baseline as care progresses.  If no significant pain relief, will transfer patient to inpatient services for a higher level of care.   Reviewed labs, mild leukocytosis, will repeat CBC   [name] M 09/23/2016, 1:29 PM

## 2016-09-23 NOTE — ED Notes (Addendum)
[facility]. NP says she spoke to PA at 11:30 to transfer patient. This was never communicated to RN. [facility], no one verbalized that patient was accepted and that patient needed ED to transport. This caused delay in care. [facility] now reluctant to accept patient close to th 1:00pm time due to their closing at 4. They will accept patient but anticipate that he may have to return to complete treatment. Patient left ED at 12:55 with NT.

## 2016-09-23 NOTE — ED Notes (Signed)
[facility] to assist in transfer/discharge of patient.

## 2016-10-10 IMAGING — CR DG CHEST 2V
2 series · 2 of 2 positions shown · non-contrast
Comparison: 05/31/2014.

CLINICAL DATA: Initial encounter for Sickle cell pain of two day
duration.

EXAM:
CHEST  2 VIEW

[w chest pa]
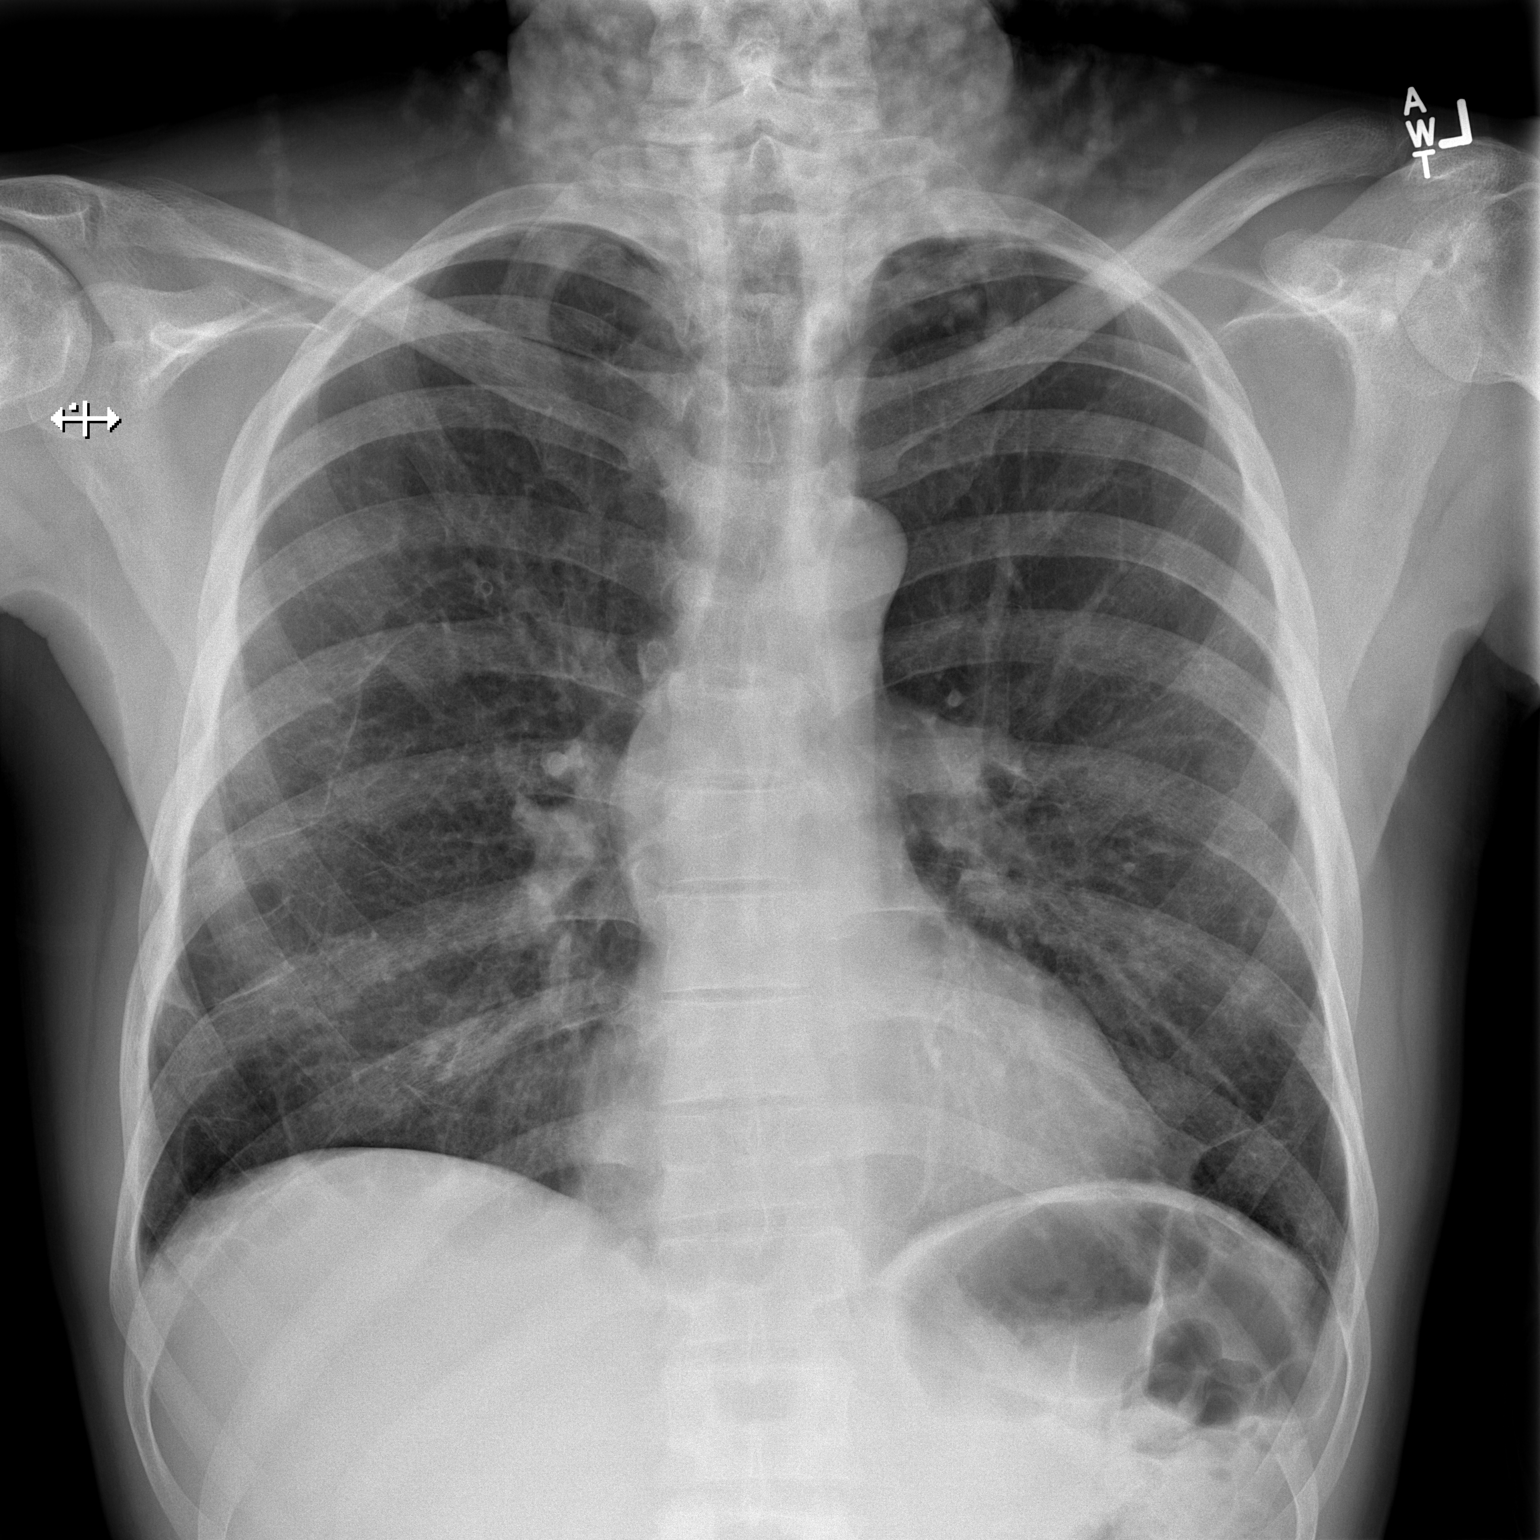

[w chest lat]
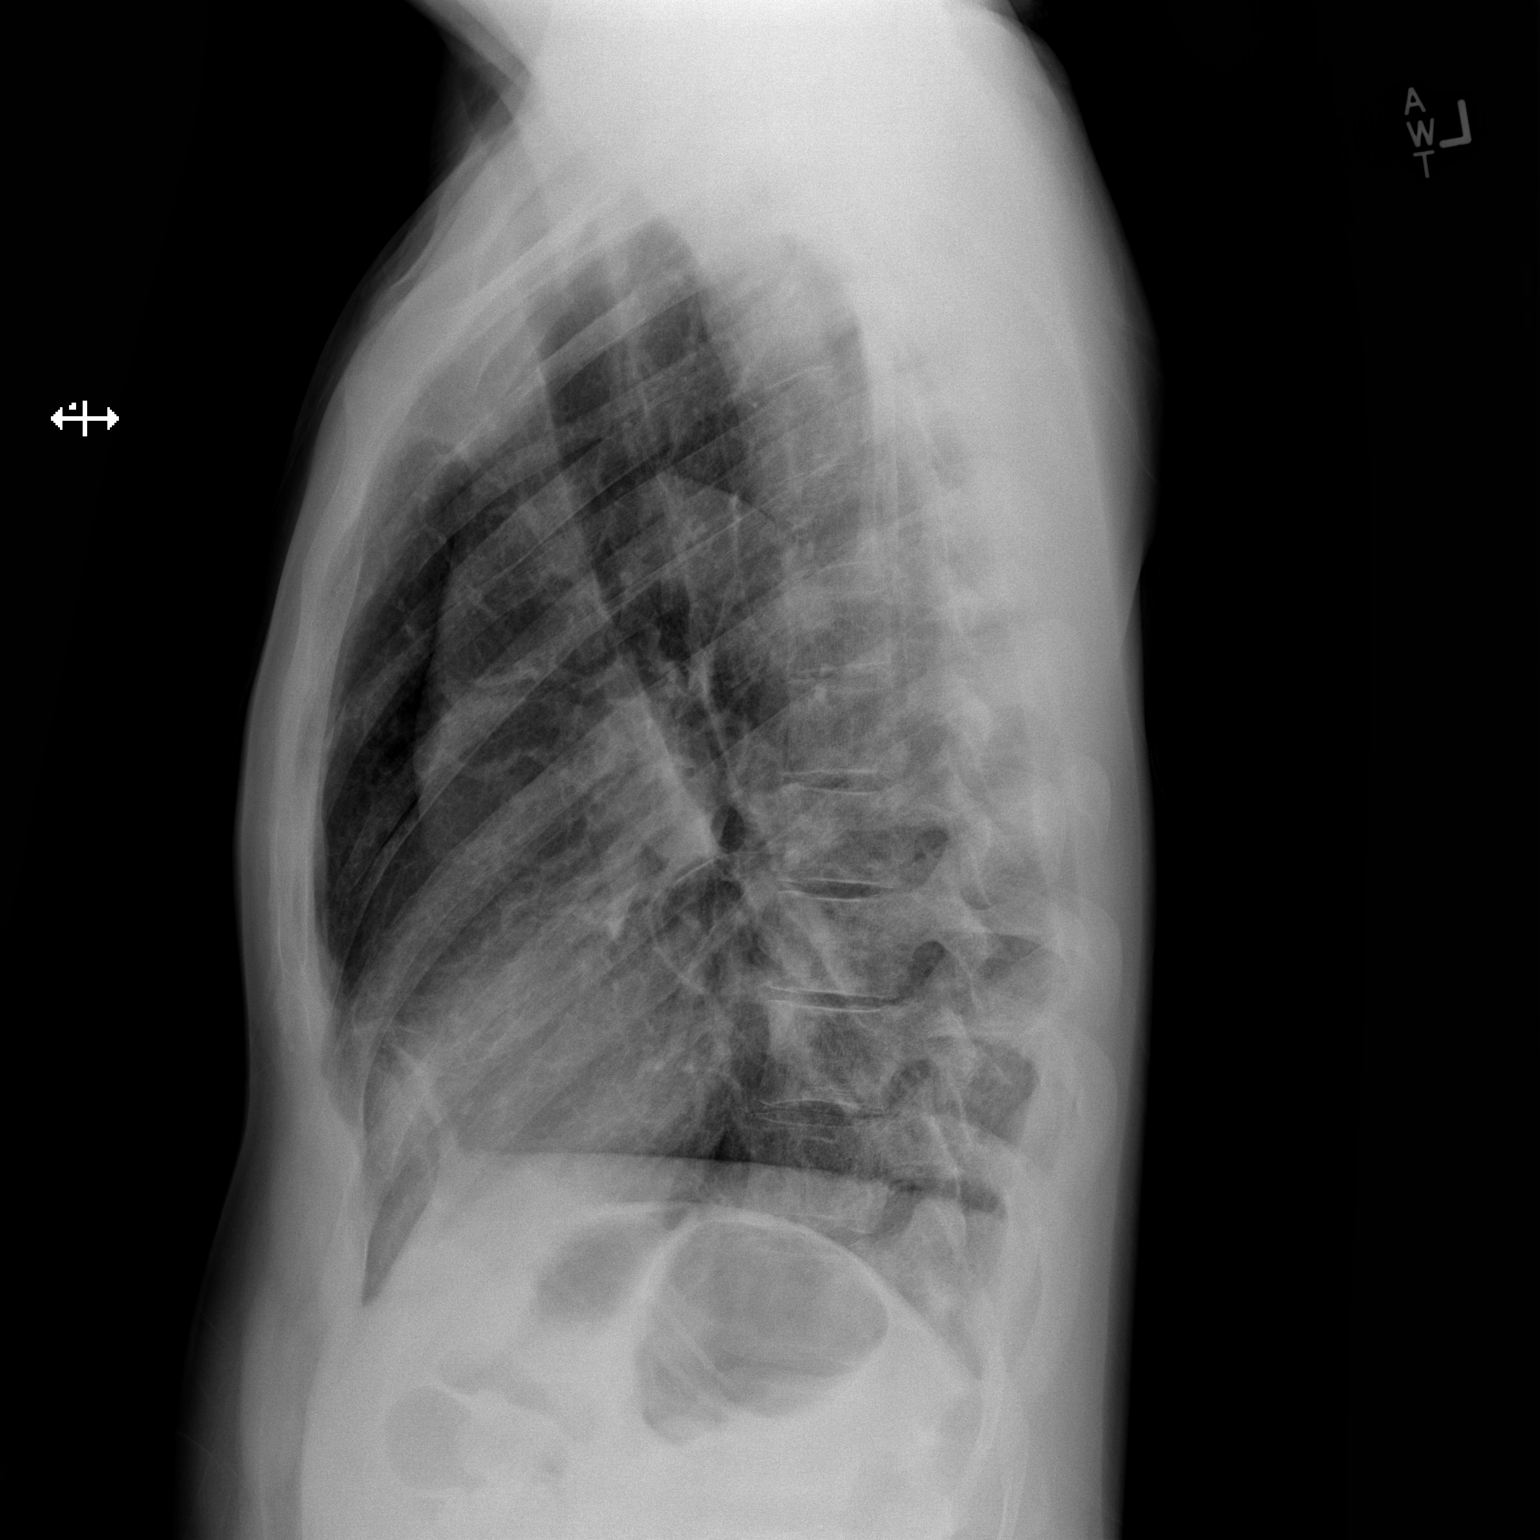

[2 of 2 positions shown; findings below may reference images not displayed]

FINDINGS: The lungs are clear without focal infiltrate, edema, pneumothorax or
pleural effusion. Interstitial markings are diffusely coarsened with
chronic features. The cardiopericardial silhouette is within normal
limits for size. Imaged bony structures of the thorax are intact.
IMPRESSION: No active cardiopulmonary disease.

## 2017-01-20 ENCOUNTER — Emergency Department (HOSPITAL_COMMUNITY): Payer: Medicare Other

## 2017-01-20 ENCOUNTER — Inpatient Hospital Stay (HOSPITAL_COMMUNITY)
Admission: EM | Admit: 2017-01-20 | Discharge: 2017-01-23 | DRG: 811 | Disposition: A | Discharge status: Home / Self Care | Payer: Medicare Other | Attending: Internal Medicine | Admitting: Internal Medicine

## 2017-01-20 ENCOUNTER — Encounter (HOSPITAL_COMMUNITY): Payer: Self-pay | Admitting: Emergency Medicine

## 2017-01-20 ENCOUNTER — Inpatient Hospital Stay (HOSPITAL_COMMUNITY): Payer: Medicare Other

## 2017-01-20 DIAGNOSIS — K5900 Constipation, unspecified: Secondary | ICD-10-CM | POA: Diagnosis not present

## 2017-01-20 DIAGNOSIS — J181 Lobar pneumonia, unspecified organism: Secondary | ICD-10-CM | POA: Diagnosis not present

## 2017-01-20 DIAGNOSIS — F17200 Nicotine dependence, unspecified, uncomplicated: Secondary | ICD-10-CM | POA: Diagnosis not present

## 2017-01-20 DIAGNOSIS — R0902 Hypoxemia: Secondary | ICD-10-CM | POA: Diagnosis present

## 2017-01-20 DIAGNOSIS — D638 Anemia in other chronic diseases classified elsewhere: Secondary | ICD-10-CM | POA: Diagnosis not present

## 2017-01-20 DIAGNOSIS — D5700 Hb-SS disease with crisis, unspecified: Secondary | ICD-10-CM | POA: Diagnosis present

## 2017-01-20 DIAGNOSIS — R079 Chest pain, unspecified: Secondary | ICD-10-CM | POA: Diagnosis present

## 2017-01-20 DIAGNOSIS — Z23 Encounter for immunization: Secondary | ICD-10-CM | POA: Diagnosis present

## 2017-01-20 DIAGNOSIS — D57219 Sickle-cell/Hb-C disease with crisis, unspecified: Secondary | ICD-10-CM | POA: Diagnosis not present

## 2017-01-20 DIAGNOSIS — M90552 Osteonecrosis in diseases classified elsewhere, left thigh: Secondary | ICD-10-CM | POA: Diagnosis present

## 2017-01-20 DIAGNOSIS — Z96641 Presence of right artificial hip joint: Secondary | ICD-10-CM | POA: Diagnosis present

## 2017-01-20 DIAGNOSIS — J189 Pneumonia, unspecified organism: Secondary | ICD-10-CM | POA: Diagnosis present

## 2017-01-20 DIAGNOSIS — Z79891 Long term (current) use of opiate analgesic: Secondary | ICD-10-CM

## 2017-01-20 DIAGNOSIS — F17210 Nicotine dependence, cigarettes, uncomplicated: Secondary | ICD-10-CM | POA: Diagnosis present

## 2017-01-20 LAB — CBC
HEMATOCRIT: 26 % — AB (ref 39.0–52.0)
HEMOGLOBIN: 9.5 g/dL — AB (ref 13.0–17.0)
MCH: 31.9 pg (ref 26.0–34.0)
MCHC: 36.5 g/dL — ABNORMAL HIGH (ref 30.0–36.0)
MCV: 87.2 fL (ref 78.0–100.0)
Platelets: 307 10*3/uL (ref 150–400)
RBC: 2.98 MIL/uL — AB (ref 4.22–5.81)
RDW: 14.7 % (ref 11.5–15.5)
WBC: 12 10*3/uL — AB (ref 4.0–10.5)

## 2017-01-20 LAB — COMPREHENSIVE METABOLIC PANEL
ALK PHOS: 91 U/L (ref 38–126)
ALT: 12 U/L — ABNORMAL LOW (ref 17–63)
ANION GAP: 9 (ref 5–15)
AST: 19 U/L (ref 15–41)
Albumin: 4.4 g/dL (ref 3.5–5.0)
BILIRUBIN TOTAL: 1.5 mg/dL — AB (ref 0.3–1.2)
BUN: 12 mg/dL (ref 6–20)
CALCIUM: 9.3 mg/dL (ref 8.9–10.3)
CO2: 24 mmol/L (ref 22–32)
Chloride: 104 mmol/L (ref 101–111)
Creatinine, Ser: 0.69 mg/dL (ref 0.61–1.24)
Glucose, Bld: 87 mg/dL (ref 65–99)
Potassium: 3.6 mmol/L (ref 3.5–5.1)
SODIUM: 137 mmol/L (ref 135–145)
TOTAL PROTEIN: 8.3 g/dL — AB (ref 6.5–8.1)

## 2017-01-20 LAB — TROPONIN I: Troponin I: <0.03 ng/mL (ref <0.03)

## 2017-01-20 LAB — DIFFERENTIAL
BASOS ABS: 0.1 10*3/uL (ref 0.0–0.1)
Basophils Relative: 1 %
Eosinophils Absolute: 0.7 10*3/uL (ref 0.0–0.7)
Eosinophils Relative: 6 %
LYMPHS PCT: 22 %
Lymphs Abs: 2.6 10*3/uL (ref 0.7–4.0)
MONO ABS: 1 10*3/uL (ref 0.1–1.0)
MONOS PCT: 8 %
NEUTROS ABS: 7.6 10*3/uL (ref 1.7–7.7)
Neutrophils Relative %: 63 %

## 2017-01-20 LAB — I-STAT TROPONIN, ED: Troponin i, poc: 0 ng/mL (ref 0.00–0.08)

## 2017-01-20 LAB — RETICULOCYTES
RBC.: 2.98 MIL/uL — AB (ref 4.22–5.81)
RETIC COUNT ABSOLUTE: 92.4 10*3/uL (ref 19.0–186.0)
Retic Ct Pct: 3.1 % (ref 0.4–3.1)

## 2017-01-20 MED ORDER — HYDROMORPHONE HCL 1 MG/ML IJ SOLN
2.0000 mg | INTRAMUSCULAR | Status: AC
  Administered 2017-01-20: 2 mg via INTRAVENOUS
  Filled 2017-01-20: qty 2

## 2017-01-20 MED ORDER — SODIUM CHLORIDE 0.9% FLUSH: 9.0000 mL | INTRAVENOUS | Status: DC | PRN

## 2017-01-20 MED ORDER — PROMETHAZINE HCL 25 MG RE SUPP: 12.5000 mg | RECTAL | Status: DC | PRN

## 2017-01-20 MED ORDER — IOPAMIDOL (ISOVUE-370) INJECTION 76%: 100.0000 mL | Freq: Once | INTRAVENOUS | Status: DC | PRN

## 2017-01-20 MED ORDER — HYDROMORPHONE 1 MG/ML IV SOLN
INTRAVENOUS | Status: DC
  Administered 2017-01-20: 2.3 mg via INTRAVENOUS
  Administered 2017-01-20: 20:00:00 via INTRAVENOUS
  Administered 2017-01-21: 1.5 mg via INTRAVENOUS
  Administered 2017-01-21: 2.7 mg via INTRAVENOUS

## 2017-01-20 MED ORDER — HYDROMORPHONE 1 MG/ML IV SOLN: INTRAVENOUS | Status: DC

## 2017-01-20 MED ORDER — MORPHINE SULFATE ER 30 MG PO TBCR
60.0000 mg | EXTENDED_RELEASE_TABLET | Freq: Two times a day (BID) | ORAL | Status: DC
  Administered 2017-01-20 – 2017-01-23 (×6): 60 mg via ORAL
  Filled 2017-01-20 (×6): qty 2

## 2017-01-20 MED ORDER — INFLUENZA VAC SPLIT QUAD 0.5 ML IM SUSY
0.5000 mL | PREFILLED_SYRINGE | INTRAMUSCULAR | Status: AC
  Administered 2017-01-21: 0.5 mL via INTRAMUSCULAR
  Filled 2017-01-20: qty 0.5

## 2017-01-20 MED ORDER — IBUPROFEN 800 MG PO TABS: 800.0000 mg | ORAL_TABLET | Freq: Three times a day (TID) | ORAL | Status: DC | PRN

## 2017-01-20 MED ORDER — PNEUMOCOCCAL VAC POLYVALENT 25 MCG/0.5ML IJ INJ
0.5000 mL | INJECTION | INTRAMUSCULAR | Status: DC
  Filled 2017-01-20: qty 0.5

## 2017-01-20 MED ORDER — HYDROMORPHONE HCL 1 MG/ML IJ SOLN
2.0000 mg | INTRAMUSCULAR | Status: DC
  Administered 2017-01-20: 2 mg via INTRAVENOUS
  Filled 2017-01-20: qty 2

## 2017-01-20 MED ORDER — IOPAMIDOL (ISOVUE-370) INJECTION 76%
100.0000 mL | Freq: Once | INTRAVENOUS | Status: AC | PRN
  Administered 2017-01-20: 100 mL via INTRAVENOUS

## 2017-01-20 MED ORDER — CYCLOBENZAPRINE HCL 10 MG PO TABS
10.0000 mg | ORAL_TABLET | Freq: Every day | ORAL | Status: DC
  Administered 2017-01-20 – 2017-01-22 (×3): 10 mg via ORAL
  Filled 2017-01-20 (×3): qty 1

## 2017-01-20 MED ORDER — MORPHINE SULFATE 15 MG PO TABS: 30.0000 mg | ORAL_TABLET | Freq: Every day | ORAL | Status: DC | PRN

## 2017-01-20 MED ORDER — DIPHENHYDRAMINE HCL 12.5 MG/5ML PO ELIX: 12.5000 mg | ORAL_SOLUTION | Freq: Four times a day (QID) | ORAL | Status: DC | PRN

## 2017-01-20 MED ORDER — DIPHENHYDRAMINE HCL 50 MG/ML IJ SOLN: 12.5000 mg | Freq: Four times a day (QID) | INTRAMUSCULAR | Status: DC | PRN

## 2017-01-20 MED ORDER — HYDROXYUREA 500 MG PO CAPS
500.0000 mg | ORAL_CAPSULE | Freq: Two times a day (BID) | ORAL | Status: DC
  Administered 2017-01-20 – 2017-01-21 (×2): 500 mg via ORAL
  Filled 2017-01-20 (×2): qty 1

## 2017-01-20 MED ORDER — ONDANSETRON HCL 4 MG/2ML IJ SOLN
4.0000 mg | Freq: Four times a day (QID) | INTRAMUSCULAR | Status: DC | PRN
  Administered 2017-01-20 – 2017-01-21 (×2): 4 mg via INTRAVENOUS
  Filled 2017-01-20 (×2): qty 2

## 2017-01-20 MED ORDER — NALOXONE HCL 0.4 MG/ML IJ SOLN: 0.4000 mg | INTRAMUSCULAR | Status: DC | PRN

## 2017-01-20 MED ORDER — KETOROLAC TROMETHAMINE 15 MG/ML IJ SOLN
15.0000 mg | INTRAMUSCULAR | Status: AC
  Administered 2017-01-20: 15 mg via INTRAVENOUS
  Filled 2017-01-20: qty 1

## 2017-01-20 MED ORDER — HYDROMORPHONE HCL 1 MG/ML IJ SOLN: 2.0000 mg | INTRAMUSCULAR | Status: AC

## 2017-01-20 MED ORDER — KETOROLAC TROMETHAMINE 15 MG/ML IJ SOLN
15.0000 mg | Freq: Four times a day (QID) | INTRAMUSCULAR | Status: DC
  Administered 2017-01-20 – 2017-01-21 (×3): 15 mg via INTRAVENOUS
  Filled 2017-01-20 (×3): qty 1

## 2017-01-20 MED ORDER — ONDANSETRON HCL 4 MG/2ML IJ SOLN: 4.0000 mg | Freq: Four times a day (QID) | INTRAMUSCULAR | Status: DC | PRN

## 2017-01-20 MED ORDER — IOPAMIDOL (ISOVUE-370) INJECTION 76%
INTRAVENOUS | Status: AC
  Administered 2017-01-20: 100 mL via INTRAVENOUS
  Filled 2017-01-20: qty 100

## 2017-01-20 MED ORDER — PROMETHAZINE HCL 25 MG PO TABS: 12.5000 mg | ORAL_TABLET | ORAL | Status: DC | PRN

## 2017-01-20 MED ORDER — HYDROMORPHONE HCL 1 MG/ML IJ SOLN: 2.0000 mg | INTRAMUSCULAR | Status: DC

## 2017-01-20 MED ORDER — FOLIC ACID 1 MG PO TABS
1.0000 mg | ORAL_TABLET | Freq: Every evening | ORAL | Status: DC
  Administered 2017-01-20 – 2017-01-22 (×3): 1 mg via ORAL
  Filled 2017-01-20 (×3): qty 1

## 2017-01-20 MED ORDER — POLYETHYLENE GLYCOL 3350 17 G PO PACK: 17.0000 g | PACK | Freq: Every day | ORAL | Status: DC | PRN

## 2017-01-20 MED ORDER — PROMETHAZINE HCL 25 MG PO TABS: 25.0000 mg | ORAL_TABLET | Freq: Four times a day (QID) | ORAL | Status: DC | PRN

## 2017-01-20 MED ORDER — SODIUM CHLORIDE 0.9 % IV SOLN
INTRAVENOUS | Status: DC
  Administered 2017-01-20 – 2017-01-22 (×4): via INTRAVENOUS

## 2017-01-20 MED ORDER — SENNOSIDES-DOCUSATE SODIUM 8.6-50 MG PO TABS
1.0000 | ORAL_TABLET | Freq: Two times a day (BID) | ORAL | Status: DC
  Administered 2017-01-20 – 2017-01-23 (×6): 1 via ORAL
  Filled 2017-01-20 (×6): qty 1

## 2017-01-20 NOTE — Progress Notes (Signed)
PCA pump set up as ordered in FULL DOSE MODE, and loading dose given as ordered. Asked on coming RN  to follow up with MD if SICKLE CELL MODE should be ordered. Pt pain level is 9. SRP,RN

## 2017-01-20 NOTE — ED Triage Notes (Signed)
Pt complaint of central chest pain with breathing; onset Sunday; hx of [facility].

## 2017-01-20 NOTE — ED Notes (Signed)
ED TO INPATIENT HANDOFF REPORT  Name/Age/Gender [name] 41 y.o. male  Code Status Code Status History    Date Active Date Inactive Code Status Order ID Comments User Context   09/17/2016  3:36 PM 09/21/2016  3:13 PM Full Code [id number]  Leana Gamer, MD Inpatient   08/13/2016  5:00 PM 08/17/2016  7:01 PM Full Code [id number]  Debbe Odea, MD ED   04/21/2016  8:57 PM 04/24/2016  4:32 PM Full Code [id number]  Janece Canterbury, MD Inpatient   03/22/2016  2:12 PM 03/26/2016  5:47 PM Full Code [id number]  Elwyn Reach, MD Inpatient   02/11/2016  4:54 PM 02/17/2016  4:10 PM Full Code [id number]  Leana Gamer, MD Inpatient   01/02/2016  2:46 PM 01/07/2016  5:05 PM Full Code [id number]  Leana Gamer, MD ED   12/05/2015  6:02 PM 12/10/2015  6:08 PM Full Code [id number]  Mariel Aloe, MD Inpatient   10/16/2015  5:45 PM 10/20/2015  2:06 PM Full Code [id number]  Leana Gamer, MD Inpatient   07/14/2015  1:15 PM 07/19/2015  6:15 PM Full Code [id number]  Elwyn Reach, MD ED   05/25/2015  2:59 PM 05/29/2015  7:17 PM Full Code [id number]  Elwyn Reach, MD Inpatient   03/19/2015  1:40 AM 03/22/2015  7:36 PM Full Code [id number]  Toy Baker, MD Inpatient   02/08/2015 11:13 PM 02/13/2015  2:15 PM Full Code [id number]  Toy Baker, MD Inpatient   01/30/2015  2:43 AM 02/02/2015  1:24 PM Full Code [id number]  Rise Patience, MD Inpatient   10/12/2014  6:35 PM 10/15/2014  1:48 PM Full Code [id number]  Janece Canterbury, MD Inpatient   05/31/2014 11:34 AM 06/04/2014  3:28 PM Full Code [id number]  Nita Sells, MD ED   05/06/2014  1:17 AM 05/09/2014  8:38 PM Full Code [id number]  Toy Baker, MD Inpatient   02/06/2014  9:05 PM 02/09/2014  7:26 PM Full Code [id number]  Allyne Gee, MD Inpatient   01/25/2014  9:01 PM 01/27/2014  4:40 PM Full Code [id number]  Etta Quill, DO ED   10/25/2013 12:50 AM 10/27/2013  7:00 PM Full Code [id number]  Theressa Millard,  MD Inpatient   05/25/2013  2:06 AM 05/28/2013  3:12 PM Full Code [id number]  Kelvin Cellar, MD Inpatient   10/13/2012  5:35 PM 10/21/2012  2:55 PM Full Code [id number]  Monika Salk, MD Inpatient   11/08/2011  2:30 AM 11/20/2011  6:07 PM Full Code [id number]  Theressa Millard, MD ED   08/21/2011  6:19 AM 08/27/2011  6:38 PM Full Code [id number]  Marion Downer, RN Inpatient   05/13/2011  7:43 AM 05/22/2011  5:07 PM Full Code [id number]  McVey, Greer Ee, RN ED      Home/SNF/Other Home  Chief Complaint sickle cell pain  Level of Care/Admitting Diagnosis ED Disposition    ED Disposition Condition [facility] Area: [place] [100102]  Level of Care: Telemetry [5]  Admit to tele based on following criteria: Complex arrhythmia (Bradycardia/Tachycardia)  Diagnosis: Sickle cell crisis [facility]) [[id number]]  Admitting Physician: Robbie Lis [phone]  Attending Physician: Robbie Lis [phone]  Estimated length of stay: 3 - 4 days  Certification:: I certify this patient will need inpatient services for at least 2 midnights  PT Class (Do Not Modify): Inpatient [101]  PT Acc Code (Do Not Modify): Private [1]  Medical History Past Medical History:  Diagnosis Date  . Avascular necrosis of hip ([place])    bilateral  . Avascular necrosis of hip, left ([place]) 08/27/2011  . Blood transfusion   . Infection of bone, shoulder region ([place])    left shoulder  . Pneumonia   . Sickle cell crisis ([place])     Allergies No Known Allergies  IV Location/Drains/Wounds Patient Lines/Drains/Airways Status   Active Line/Drains/Airways    Name:   Placement date:   Placement time:   Site:   Days:   Peripheral IV 01/20/17 Left Forearm  01/20/17    1408    Forearm    less than 1          Labs/Imaging Results for orders placed or performed during the hospital encounter of 01/20/17 (from the past 48 hour(s))  I-stat troponin, ED     Status: None   Collection Time: 01/20/17 12:39  PM  Result Value Ref Range   Troponin i, poc 0.00 0.00 - 0.08 ng/mL   Comment 3            Comment: Due to the release kinetics of cTnI, a negative result within the first hours of the onset of symptoms does not rule out myocardial infarction with certainty. If myocardial infarction is still suspected, repeat the test at appropriate intervals.   Comprehensive metabolic panel     Status: Abnormal   Collection Time: 01/20/17  1:33 PM  Result Value Ref Range   Sodium 137 135 - 145 mmol/L   Potassium 3.6 3.5 - 5.1 mmol/L   Chloride 104 101 - 111 mmol/L   CO2 24 22 - 32 mmol/L   Glucose, Bld 87 65 - 99 mg/dL   BUN 12 6 - 20 mg/dL   Creatinine, Ser 0.69 0.61 - 1.24 mg/dL   Calcium 9.3 8.9 - 10.3 mg/dL   Total Protein 8.3 (H) 6.5 - 8.1 g/dL   Albumin 4.4 3.5 - 5.0 g/dL   AST 19 15 - 41 U/L   ALT 12 (L) 17 - 63 U/L   Alkaline Phosphatase 91 38 - 126 U/L   Total Bilirubin 1.5 (H) 0.3 - 1.2 mg/dL   GFR calc non Af Amer >60 >60 mL/min   GFR calc Af Amer >60 >60 mL/min    Comment: (NOTE) The eGFR has been calculated using the CKD EPI equation. This calculation has not been validated in all clinical situations. eGFR's persistently <60 mL/min signify possible Chronic Kidney Disease.    Anion gap 9 5 - 15  CBC     Status: Abnormal   Collection Time: 01/20/17  1:33 PM  Result Value Ref Range   WBC 12.0 (H) 4.0 - 10.5 K/uL   RBC 2.98 (L) 4.22 - 5.81 MIL/uL   Hemoglobin 9.5 (L) 13.0 - 17.0 g/dL   HCT 26.0 (L) 39.0 - 52.0 %   MCV 87.2 78.0 - 100.0 fL   MCH 31.9 26.0 - 34.0 pg   MCHC 36.5 (H) 30.0 - 36.0 g/dL   RDW 14.7 11.5 - 15.5 %   Platelets 307 150 - 400 K/uL  Reticulocytes     Status: Abnormal   Collection Time: 01/20/17  1:33 PM  Result Value Ref Range   Retic Ct Pct 3.1 0.4 - 3.1 %   RBC. 2.98 (L) 4.22 - 5.81 MIL/uL   Retic Count, Absolute 92.4 19.0 - 186.0 K/uL   Dg Chest 2 View  Result Date: 01/20/2017 CLINICAL DATA:  Cell crisis. Worsening  right-sided chest pain for  the past 4 days. Current smoker. EXAM: CHEST  2 VIEW COMPARISON:  Chest x-ray of Sep 17, 2016 FINDINGS: The lungs are well-expanded. The interstitial markings are mildly increased. There is no alveolar infiltrate, pleural effusion, or pneumothorax. The heart and pulmonary vascularity are normal. The bony thorax is unremarkable. IMPRESSION: Chronic interstitial prominence bilaterally. No acute pneumonia nor CHF. Electronically Signed   By: David  Martinique M.D.   On: 01/20/2017 10:04    Pending Labs Unresulted Labs    Start     Ordered   01/20/17 1705  Troponin I (q 6hr x 3)  Now then every 6 hours,   R     01/20/17 1704      Vitals/Pain Today's Vitals   01/20/17 1453 01/20/17 1500 01/20/17 1530 01/20/17 1703  BP: 125/90 121/83 120/89 (!) 107/94  Pulse: 72 66 69 73  Resp: 14 15 13 16   Temp:      TempSrc:      SpO2: 96% 96% 92% 93%  Weight:      Height:      PainSc:        Isolation Precautions No active isolations  Medications Medications  naloxone (NARCAN) injection 0.4 mg (not administered)    And  sodium chloride flush (NS) 0.9 % injection 9 mL (not administered)  ondansetron (ZOFRAN) injection 4 mg (not administered)  diphenhydrAMINE (BENADRYL) injection 12.5 mg (not administered)    Or  diphenhydrAMINE (BENADRYL) 12.5 MG/5ML elixir 12.5 mg (not administered)  HYDROmorphone (DILAUDID) 1 mg/mL PCA injection (not administered)  ketorolac (TORADOL) 15 MG/ML injection 15 mg (15 mg Intravenous Given 01/20/17 1408)  HYDROmorphone (DILAUDID) injection 2 mg (2 mg Intravenous Given 01/20/17 1409)    Or  HYDROmorphone (DILAUDID) injection 2 mg ( Subcutaneous See Alternative 01/20/17 1409)  HYDROmorphone (DILAUDID) injection 2 mg (2 mg Intravenous Given 01/20/17 1433)    Or  HYDROmorphone (DILAUDID) injection 2 mg ( Subcutaneous See Alternative 01/20/17 1433)  HYDROmorphone (DILAUDID) injection 2 mg (2 mg Intravenous Given 01/20/17 1524)    Or  HYDROmorphone (DILAUDID) injection 2 mg (  Subcutaneous See Alternative 01/20/17 1524)    Mobility walks

## 2017-01-20 NOTE — Progress Notes (Signed)
Pt   Admitted to 1443 in stable condition, oriented to room, VSs, pain level 9--preparing for PCA connection.SPR, RN

## 2017-01-20 NOTE — ED Notes (Signed)
Bed: [facility]PT1 Expected date:  Expected time:  Means of arrival:  Comments: 

## 2017-01-20 NOTE — ED Notes (Signed)
Pt requested to wait until getting into a room in the back to obtain labs with IV start.

## 2017-01-20 NOTE — ED Notes (Signed)
This nurse attempted PIV. Unsuccessful attempt. Patient states "usually they have to use that ultrasound machine." Charge nurse Matt requested to start PIV.

## 2017-01-20 NOTE — H&P (Signed)
History and Physical    [name] ZOX:[id number] DOB: 06/24/1975 DOA: 01/20/2017  Referring MD/NP/PA: Dr. Jeraldine Loots  PCP: Fleet Contras, MD   Patient coming from: home   Chief Complaint: right side chest pain  HPI: [name] is a 41 y.o. male with medical history significant for sickle cell disease who presented to ED with worsening right-sided chest pain that started on Sunday, at rest. Chest pain is 10 out of 10 in intensity at worst, sharp, constant, worse with deep breathing and coughing to the point that patient feels as soon as he takes a deep breath it feels like something sharp sticking into the lungs. No specific alleviating factors. He has been taking his regular analgesics at home with no significant symptomatic relief. No fevers or chills. No palpitations. No reports of GI complaints or GU complaints. No lightheadedness or loss of consciousness.  ED Course: In ED, patient was slightly hypoxic with oxygen saturation of 92% on nasal cannula oxygen. Blood work was notable for mild leukocytosis of 12, hemoglobin 9.5. Chest x-ray showed chronic interstitial prominence bilaterally but no acute pneumonia. He was given multiple doses of Dilaudid with no significant pain relief.     Review of Systems:  Constitutional: Negative for fever, chills, diaphoresis, activity change, appetite change and fatigue.  HENT: Negative for ear pain, nosebleeds, congestion, facial swelling, rhinorrhea, neck pain, neck stiffness and ear discharge.   Eyes: Negative for pain, discharge, redness, itching and visual disturbance.  Respiratory: Negative for cough, choking, chest tightness, shortness of breath, wheezing and stridor.   Cardiovascular: Chest pain, no palpitations Gastrointestinal: Negative for abdominal distention.  Genitourinary: Negative for dysuria, urgency, frequency, hematuria, flank pain, decreased urine volume, difficulty urinating and dyspareunia.  Musculoskeletal: No edema, no  joint pain Neurological: Negative for dizziness, tremors, seizures, syncope, facial asymmetry, speech difficulty, weakness, light-headedness, numbness and headaches.  Hematological: Negative for adenopathy. Does not bruise/bleed easily.  Psychiatric/Behavioral: Negative for hallucinations, behavioral problems, confusion, dysphoric mood, decreased concentration and agitation.   Past Medical History:  Diagnosis Date  . Avascular necrosis of hip (HCC)    bilateral  . Avascular necrosis of hip, left (HCC) 08/27/2011  . Blood transfusion   . Infection of bone, shoulder region (HCC)    left shoulder  . Pneumonia   . Sickle cell crisis [facility])     Past Surgical History:  Procedure Laterality Date  . BONE GRAFT HIP ILIAC CREST    . JOINT REPLACEMENT  2006   right total hip arthroplasty  . Orif right hip fracture  1995    Social history:  reports that he has been smoking Cigarettes.  He has been smoking about 0.50 packs per day. He has never used smokeless tobacco. He reports that he does not drink alcohol or use drugs.  Ambulation: ambulates without assistance baseline.  No Known Allergies  Family History  Problem Relation Age of Onset  . Adopted: Yes    Prior to Admission medications   Medication Sig Start Date End Date Taking? Authorizing Provider  cyclobenzaprine (FLEXERIL) 10 MG tablet Take 10 mg by mouth at bedtime.   Yes [provider]  folic acid (FOLVITE) 1 MG tablet Take 1 mg by mouth every evening.    Yes [provider]  hydroxyurea (HYDREA) 500 MG capsule Take 500 mg by mouth 2 (two) times daily. May take with food to minimize GI side effects.   Yes [provider]  ibuprofen (ADVIL,MOTRIN) 800 MG tablet Take 800 mg by  mouth every 8 (eight) hours as needed for moderate pain.  09/07/16  Yes [provider]  morphine (MS CONTIN) 60 MG 12 hr tablet Take 60 mg by mouth 2 (two) times daily. 12/29/16  Yes [provider]  morphine (MSIR)  30 MG tablet Take 1 tablet (30 mg total) by mouth daily as needed for severe pain (pain). Resume after completing dilaudid. 02/17/16  Yes Altha Harm, MD  promethazine (PHENERGAN) 25 MG tablet Take 25 mg by mouth every 6 (six) hours as needed for nausea (nausea).    Yes [provider]  morphine (MS CONTIN) 60 MG 12 hr tablet Take 1 tablet (60 mg total) by mouth 2 (two) times daily. Patient not taking: Reported on 01/20/2017 11/20/11   Grayce Sessions, NP    Physical Exam: Vitals:   01/20/17 1300 01/20/17 1453 01/20/17 1500 01/20/17 1530  BP: 116/87 125/90 121/83 120/89  Pulse: 61 72 66 69  Resp: Temp:      TempSrc:      SpO2: 97% 96% 96% 92%  Weight:      Height:        Constitutional: NAD, calm, comfortable Vitals:   01/20/17 1300 01/20/17 1453 01/20/17 1500 01/20/17 1530  BP: 116/87 125/90 121/83 120/89  Pulse: 61 72 66 69  Resp: Temp:      TempSrc:      SpO2: 97% 96% 96% 92%  Weight:      Height:       Eyes: PERRL, lids and conjunctivae normal ENMT: Mucous membranes are moist. Posterior pharynx clear of any exudate or lesions.Normal dentition.  Neck: normal, supple, no masses, no thyromegaly Respiratory: clear to auscultation bilaterally, no wheezing, no crackles. Normal respiratory effort. No accessory muscle use.  Cardiovascular: Regular rate and rhythm, no murmurs / rubs / gallops. No extremity edema. 2+ pedal pulses. No carotid bruits.  Abdomen: no tenderness, no masses palpated. No hepatosplenomegaly. Bowel sounds positive.  Musculoskeletal: Patient has pain along the right side of the rib cage, some pain in the back Skin: no rashes, lesions, ulcers. No induration Neurologic: CN 2-12 grossly intact. Sensation intact, DTR normal. Strength 5/5 in all 4.  Psychiatric: Normal judgment and insight. Alert and oriented x 3. Normal mood.   Labs on Admission: I have personally reviewed following labs and imaging  studies  CBC:  Recent Labs Lab 01/20/17 1333  WBC 12.0*  HGB 9.5*  HCT 26.0*  MCV 87.2  PLT 307   Basic Metabolic Panel:  Recent Labs Lab 01/20/17 1333  NA 137  K 3.6  CL 104  CO2 24  GLUCOSE 87  BUN 12  CREATININE 0.69  CALCIUM 9.3   GFR: Estimated Creatinine Clearance: 132.5 mL/min (by C-G formula based on SCr of 0.69 mg/dL). Liver Function Tests:  Recent Labs Lab 01/20/17 1333  AST 19  ALT 12*  ALKPHOS 91  BILITOT 1.5*  PROT 8.3*  ALBUMIN 4.4   No results for input(s): LIPASE, AMYLASE in the last 168 hours. No results for input(s): AMMONIA in the last 168 hours. Coagulation Profile: No results for input(s): INR, PROTIME in the last 168 hours. Cardiac Enzymes: No results for input(s): CKTOTAL, CKMB, CKMBINDEX, TROPONINI in the last 168 hours. BNP (last 3 results) No results for input(s): PROBNP in the last 8760 hours. HbA1C: No results for input(s): HGBA1C in the last 72 hours. CBG: No results for input(s): GLUCAP in the last 168 hours.  Lipid Profile: No results for input(s): CHOL, HDL, LDLCALC, TRIG, CHOLHDL, LDLDIRECT in the last 72 hours. Thyroid Function Tests: No results for input(s): TSH, T4TOTAL, FREET4, T3FREE, THYROIDAB in the last 72 hours. Anemia Panel:  Recent Labs  01/20/17 1333  RETICCTPCT 3.1   Urine analysis:    Component Value Date/Time   COLORURINE YELLOW 03/23/2016 2144   APPEARANCEUR CLEAR 03/23/2016 2144   LABSPEC 1.009 03/23/2016 2144   PHURINE 5.5 03/23/2016 2144   GLUCOSEU NEGATIVE 03/23/2016 2144   HGBUR NEGATIVE 03/23/2016 2144   BILIRUBINUR NEGATIVE 03/23/2016 2144   KETONESUR NEGATIVE 03/23/2016 2144   PROTEINUR NEGATIVE 03/23/2016 2144   UROBILINOGEN 0.2 10/25/2013 0020   NITRITE NEGATIVE 03/23/2016 2144   LEUKOCYTESUR NEGATIVE 03/23/2016 2144   Sepsis Labs: (procalcitonin:4,lacticidven:4) )No results found for this or any previous visit (from the past 240 hour(s)).   Radiological Exams on  Admission: Dg Chest 2 View  Result Date: 01/20/2017 CLINICAL DATA:  Cell crisis. Worsening right-sided chest pain for the past 4 days. Current smoker. EXAM: CHEST  2 VIEW COMPARISON:  Chest x-ray of Sep 17, 2016 FINDINGS: The lungs are well-expanded. The interstitial markings are mildly increased. There is no alveolar infiltrate, pleural effusion, or pneumothorax. The heart and pulmonary vascularity are normal. The bony thorax is unremarkable. IMPRESSION: Chronic interstitial prominence bilaterally. No acute pneumonia nor CHF. Electronically Signed   By: David  [place] M.D.   On: 01/20/2017 10:04    EKG: Pending  Assessment/Plan  Active Problems: Right-sided chest pain - Patient presented with right-sided chest pain. He was slightly hypoxic with oxygen saturation of 92% and needed nasal cannula oxygen support. In addition his chest pain is worse with breathing so it is reasonable to obtain CT angiogram of the chest to rule out pulmonary embolism - Continue supportive care with Dilaudid PCA; he has been given multiple doses of 2 mg Dilaudid on almost every 30 minute basis in ED and this has not provided significant symptomatic relief - Obtain troponin levels, total of 3 sets - Continue oxygen monitoring  Sickle cell disease - Resume home meds    DVT prophylaxis: SCD's Code Status: full code Family Communication: no family at the bedside  Disposition Plan: admission to telemetry  Consults called: none   Disposition plan: Further plan will depend as patient's clinical course evolves and further radiologic and laboratory data become available.    At the time of admission, it appears that the appropriate admission status for this patient is INPATIENT .Thisis judged to be reasonable and necessary in order to provide the required intensity of service to ensure the patient's safetygiven the patient presentation of right-sided chest pain in addition to physical exam findings, radiographic and  laboratory data in the context of chronic comorbidities.    Manson Passey MD [facility] Pager [phone]  If 7PM-7AM, please contact night-coverage [email or web address] Password [place]  01/20/2017, 4:58 PM

## 2017-01-20 NOTE — ED Notes (Signed)
Call report to Sophia at [phone] at [facility]

## 2017-01-20 NOTE — ED Provider Notes (Signed)
[facility]-EMERGENCY DEPT Provider Note   CSN: [id number] Arrival date & time: 01/20/17  0905     History   Chief Complaint Chief Complaint  Patient presents with  . Chest Pain  . Sickle Cell Pain Crisis    HPI [name] is a 41 y.o. male.  HPI Patient with history of sickle cell disease presents with right-sided chest pain. Pain began yesterday, since onset has been persistent, right-sided, sore, worse with inspiration. Minimal associated cough, no fever, no left-sided chest pain, no nausea, no vomiting. No relief in spite of taking home medications including narcotics.  Past Medical History:  Diagnosis Date  . Avascular necrosis of hip (HCC)    bilateral  . Avascular necrosis of hip, left (HCC) 08/27/2011  . Blood transfusion   . Infection of bone, shoulder region (HCC)    left shoulder  . Pneumonia   . Sickle cell crisis [facility])     Patient Active Problem List   Diagnosis Date Noted  . Sickle cell anemia (HCC) 08/13/2015  . Sickle cell disease, type Joyce (HCC) 10/18/2012  . Sickle cell pain crisis (HCC) 10/13/2012  . History of tobacco abuse 10/13/2012  . Anemia 11/08/2011  . Avascular necrosis of hip, left (HCC) 08/27/2011    Past Surgical History:  Procedure Laterality Date  . BONE GRAFT HIP ILIAC CREST    . JOINT REPLACEMENT  2006   right total hip arthroplasty  . Orif right hip fracture  1995       Home Medications    Prior to Admission medications   Medication Sig Start Date End Date Taking? Authorizing Provider  cyclobenzaprine (FLEXERIL) 10 MG tablet Take 10 mg by mouth at bedtime.   Yes [provider]  folic acid (FOLVITE) 1 MG tablet Take 1 mg by mouth every evening.    Yes [provider]  hydroxyurea (HYDREA) 500 MG capsule Take 500 mg by mouth 2 (two) times daily. May take with food to minimize GI side effects.   Yes [provider]  ibuprofen (ADVIL,MOTRIN) 800 MG tablet Take 800 mg by mouth every 8 (eight) hours as  needed for moderate pain.  09/07/16  Yes [provider]  morphine (MS CONTIN) 60 MG 12 hr tablet Take 60 mg by mouth 2 (two) times daily. 12/29/16  Yes [provider]  morphine (MSIR) 30 MG tablet Take 1 tablet (30 mg total) by mouth daily as needed for severe pain (pain). Resume after completing dilaudid. 02/17/16  Yes Altha Harm, MD  promethazine (PHENERGAN) 25 MG tablet Take 25 mg by mouth every 6 (six) hours as needed for nausea (nausea).    Yes [provider]  morphine (MS CONTIN) 60 MG 12 hr tablet Take 1 tablet (60 mg total) by mouth 2 (two) times daily. Patient not taking: Reported on 01/20/2017 11/20/11   Grayce Sessions, NP    Family History Family History  Problem Relation Age of Onset  . Adopted: Yes    Social History Social History  Substance Use Topics  . Smoking status: Current Some Day Smoker    Packs/day: 0.50    Types: Cigarettes    Last attempt to quit: 01/27/2012  . Smokeless tobacco: Never Used  . Alcohol use No     Allergies   Patient has no known allergies.   Review of Systems Review of Systems  Constitutional:       Per HPI, otherwise negative  HENT:       Per  HPI, otherwise negative  Respiratory:       Per HPI, otherwise negative  Cardiovascular:       Per HPI, otherwise negative  Gastrointestinal: Negative for vomiting.  Endocrine:       Negative aside from HPI  Genitourinary:       Neg aside from HPI   Musculoskeletal:       Per HPI, otherwise negative  Skin: Negative.   Allergic/Immunologic: Positive for immunocompromised state.  Neurological: Negative for syncope.     Physical Exam Updated Vital Signs BP 116/87   Pulse 61   Temp 98.2 F (36.8 C) (Oral)   Resp 14   Ht  (1.88 m)   Wt 77.1 kg (170 lb)   SpO2 97%   BMI 21.83 kg/m   Physical Exam  Constitutional: He is oriented to person, place, and time. He appears well-developed. No distress.  HENT:  Head: Normocephalic and  atraumatic.  Eyes: Conjunctivae and EOM are normal.  Cardiovascular: Normal rate and regular rhythm.   Pulmonary/Chest: Effort normal. No stridor. No respiratory distress.  Abdominal: He exhibits no distension.  Musculoskeletal: He exhibits no edema.  Neurological: He is alert and oriented to person, place, and time.  Skin: Skin is warm and dry.  Psychiatric: He has a normal mood and affect.  Nursing note and vitals reviewed.    ED Treatments / Results  Labs (all labs ordered are listed, but only abnormal results are displayed) Labs Reviewed  COMPREHENSIVE METABOLIC PANEL - Abnormal; Notable for the following:       Result Value   Total Protein 8.3 (*)    ALT 12 (*)    Total Bilirubin 1.5 (*)    All other components within normal limits  CBC - Abnormal; Notable for the following:    WBC 12.0 (*)    RBC 2.98 (*)    Hemoglobin 9.5 (*)    HCT 26.0 (*)    MCHC 36.5 (*)    All other components within normal limits  RETICULOCYTES - Abnormal; Notable for the following:    RBC. 2.98 (*)    All other components within normal limits  I-STAT TROPONIN, ED    EKG  EKG Interpretation  Date/Time:  Wednesday January 20 2017 09:16:35 EDT Ventricular Rate:  92 PR Interval:    QRS Duration: 105 QT Interval:  376 QTC Calculation: 466 R Axis:   40 Text Interpretation:  Sinus rhythm Abnormal R-wave progression, early transition Artifact Abnormal ekg Confirmed by Gerhard Munch [phone]) on 01/20/2017 1:48:31 PM       Radiology Dg Chest 2 View  Result Date: 01/20/2017 CLINICAL DATA:  Cell crisis. Worsening right-sided chest pain for the past 4 days. Current smoker. EXAM: CHEST  2 VIEW COMPARISON:  Chest x-ray of Sep 17, 2016 FINDINGS: The lungs are well-expanded. The interstitial markings are mildly increased. There is no alveolar infiltrate, pleural effusion, or pneumothorax. The heart and pulmonary vascularity are normal. The bony thorax is unremarkable. IMPRESSION: Chronic  interstitial prominence bilaterally. No acute pneumonia nor CHF. Electronically Signed   By: David  [place] M.D.   On: 01/20/2017 10:04    Procedures Procedures (including critical care time)  Medications Ordered in ED Medications  ketorolac (TORADOL) 15 MG/ML injection 15 mg (not administered)  HYDROmorphone (DILAUDID) injection 2 mg (not administered)    Or  HYDROmorphone (DILAUDID) injection 2 mg (not administered)  HYDROmorphone (DILAUDID) injection 2 mg (not administered)    Or  HYDROmorphone (DILAUDID) injection 2 mg (not  administered)  HYDROmorphone (DILAUDID) injection 2 mg (not administered)    Or  HYDROmorphone (DILAUDID) injection 2 mg (not administered)  HYDROmorphone (DILAUDID) injection 2 mg (not administered)    Or  HYDROmorphone (DILAUDID) injection 2 mg (not administered)     Initial Impression / Assessment and Plan / ED Course  I have reviewed the triage vital signs and the nursing notes.  Pertinent labs & imaging results that were available during my care of the patient were reviewed by me and considered in my medical decision making (see chart for details).  4:23 PM Patient remains in pain in spite of multiple doses of narcotic, per protocol, fluids, Toradol.  Labs generally reassuring, though there is mild leukocytosis. Patient is afebrile, x-ray is unremarkable, no obvious pneumonia and the patient is not hypoxic. With persistency of his pain, suspicion for sickle cell pain episode, the patient will be admitted for further evaluation and management.  Final Clinical Impressions(s) / ED Diagnoses  Sickle cell pain crisis   Gerhard Munch, MD 01/20/17 1624

## 2017-01-21 DIAGNOSIS — D57219 Sickle-cell/Hb-C disease with crisis, unspecified: Secondary | ICD-10-CM

## 2017-01-21 DIAGNOSIS — F17200 Nicotine dependence, unspecified, uncomplicated: Secondary | ICD-10-CM

## 2017-01-21 DIAGNOSIS — J181 Lobar pneumonia, unspecified organism: Secondary | ICD-10-CM

## 2017-01-21 DIAGNOSIS — D638 Anemia in other chronic diseases classified elsewhere: Secondary | ICD-10-CM

## 2017-01-21 LAB — STREP PNEUMONIAE URINARY ANTIGEN: STREP PNEUMO URINARY ANTIGEN: NEGATIVE

## 2017-01-21 LAB — TROPONIN I: Troponin I: <0.03 ng/mL (ref <0.03)

## 2017-01-21 MED ORDER — PNEUMOCOCCAL 13-VAL CONJ VACC IM SUSP
0.5000 mL | INTRAMUSCULAR | Status: AC
Start: 2017-01-21 — End: 2017-01-22
  Administered 2017-01-22: 0.5 mL via INTRAMUSCULAR
  Filled 2017-01-21: qty 0.5

## 2017-01-21 MED ORDER — HYDROXYUREA 500 MG PO CAPS
500.0000 mg | ORAL_CAPSULE | Freq: Once | ORAL | Status: AC
  Administered 2017-01-21: 500 mg via ORAL
  Filled 2017-01-21: qty 1

## 2017-01-21 MED ORDER — PROMETHAZINE HCL 25 MG PO TABS
25.0000 mg | ORAL_TABLET | Freq: Four times a day (QID) | ORAL | Status: DC | PRN
  Administered 2017-01-22 (×2): 25 mg via ORAL
  Filled 2017-01-21 (×2): qty 1

## 2017-01-21 MED ORDER — HYDROMORPHONE HCL-NACL 0.5-0.9 MG/ML-% IV SOSY
1.0000 mg | PREFILLED_SYRINGE | Freq: Once | INTRAVENOUS | Status: AC
  Administered 2017-01-21: 1 mg via INTRAVENOUS

## 2017-01-21 MED ORDER — KETOROLAC TROMETHAMINE 15 MG/ML IJ SOLN
30.0000 mg | Freq: Four times a day (QID) | INTRAMUSCULAR | Status: DC
  Administered 2017-01-21 – 2017-01-23 (×8): 30 mg via INTRAVENOUS
  Filled 2017-01-21 (×8): qty 2

## 2017-01-21 MED ORDER — ONDANSETRON HCL 4 MG/2ML IJ SOLN
4.0000 mg | Freq: Four times a day (QID) | INTRAMUSCULAR | Status: DC | PRN
  Administered 2017-01-21 – 2017-01-23 (×5): 4 mg via INTRAVENOUS
  Filled 2017-01-21 (×4): qty 2

## 2017-01-21 MED ORDER — SODIUM CHLORIDE 0.9 % IV SOLN
25.0000 mg | INTRAVENOUS | Status: DC | PRN
  Filled 2017-01-21: qty 0.5

## 2017-01-21 MED ORDER — NICOTINE 14 MG/24HR TD PT24
14.0000 mg | MEDICATED_PATCH | Freq: Every day | TRANSDERMAL | Status: DC
Start: 2017-01-21 — End: 2017-01-23
  Administered 2017-01-21 – 2017-01-23 (×3): 14 mg via TRANSDERMAL
  Filled 2017-01-21 (×3): qty 1

## 2017-01-21 MED ORDER — AZITHROMYCIN 250 MG PO TABS
500.0000 mg | ORAL_TABLET | ORAL | Status: DC
  Administered 2017-01-21 – 2017-01-23 (×3): 500 mg via ORAL
  Filled 2017-01-21 (×4): qty 2

## 2017-01-21 MED ORDER — HYDROMORPHONE 1 MG/ML IV SOLN
INTRAVENOUS | Status: DC
  Administered 2017-01-21: 2.5 mg via INTRAVENOUS
  Administered 2017-01-22: 5 mg via INTRAVENOUS
  Administered 2017-01-22: 7 mg via INTRAVENOUS
  Administered 2017-01-22: 6.5 mg via INTRAVENOUS
  Administered 2017-01-22: 11:00:00 via INTRAVENOUS
  Administered 2017-01-23: 5.5 mg via INTRAVENOUS
  Administered 2017-01-23: 0 mg via INTRAVENOUS
  Administered 2017-01-23: 6 mg via INTRAVENOUS
  Administered 2017-01-23: 2.59 mg via INTRAVENOUS
  Administered 2017-01-23: 9.5 mg via INTRAVENOUS
  Filled 2017-01-21 (×4): qty 25

## 2017-01-21 MED ORDER — NALOXONE HCL 0.4 MG/ML IJ SOLN: 0.4000 mg | INTRAMUSCULAR | Status: DC | PRN

## 2017-01-21 MED ORDER — ENOXAPARIN SODIUM 40 MG/0.4ML ~~LOC~~ SOLN
40.0000 mg | SUBCUTANEOUS | Status: DC
  Administered 2017-01-21 – 2017-01-22 (×2): 40 mg via SUBCUTANEOUS
  Filled 2017-01-21 (×3): qty 0.4

## 2017-01-21 MED ORDER — DEXTROSE 5 % IV SOLN
1.0000 g | INTRAVENOUS | Status: DC
  Administered 2017-01-21 – 2017-01-23 (×3): 1 g via INTRAVENOUS
  Filled 2017-01-21 (×3): qty 10

## 2017-01-21 MED ORDER — ENSURE ENLIVE PO LIQD
237.0000 mL | Freq: Two times a day (BID) | ORAL | Status: DC
  Administered 2017-01-21 – 2017-01-23 (×4): 237 mL via ORAL

## 2017-01-21 MED ORDER — DIPHENHYDRAMINE HCL 25 MG PO CAPS: 25.0000 mg | ORAL_CAPSULE | ORAL | Status: DC | PRN

## 2017-01-21 MED ORDER — HYDROXYUREA 500 MG PO CAPS
1000.0000 mg | ORAL_CAPSULE | Freq: Every day | ORAL | Status: DC
  Administered 2017-01-22 – 2017-01-23 (×2): 1000 mg via ORAL
  Filled 2017-01-21 (×2): qty 2

## 2017-01-21 MED ORDER — SODIUM CHLORIDE 0.9% FLUSH: 9.0000 mL | INTRAVENOUS | Status: DC | PRN

## 2017-01-21 NOTE — Progress Notes (Signed)
Initial Nutrition Assessment  DOCUMENTATION CODES:   Not applicable  INTERVENTION:   Ensure Enlive po BID, each supplement provides 350 kcal and 20 grams of protein  NUTRITION DIAGNOSIS:   Inadequate oral intake related to poor appetite as evidenced by per patient/family report.  GOAL:   Patient will meet greater than or equal to 90% of their needs  MONITOR:   PO intake, Supplement acceptance, Weight trends, Labs  REASON FOR ASSESSMENT:   Malnutrition Screening Tool    ASSESSMENT:   Pt with PMH significant for Sickle cell disease, Avascular necrosis of hip s/p replacement, and acute chest syndrome. Presents this admission with right sided chest pain. Admitted for sickle cell crisis.   Spoke with pt at bedside. Reports a decrease in PO intake five days prior to admission related to increasing pain. States he consumed one meal per day that consist of chicken, seafood, and vegetables. Over the last month, pt has been "going through some things" at home to which his appetite was on and off. Pt amendable to Ensure this hospital stay.   Pt reports a UBW of 165-170 lb. Records indicate pt has lost 6.6% of body wt since last admission in April 2018. This percentage in this time frame is not significant.   Nutrition-Focused physical exam completed. Findings are no fat depletion, mild muscle depletion, and no edema. Pt shows to have mild muscle depletions in bilateral lower extremities. Given pt's UBW, suspect pt has always been of smaller frame.    Medications reviewed and include: folic acid, IV abx Labs reviewed: ALT 12 (L)  Diet Order:  Diet regular Room service appropriate? Yes; Fluid consistency: Thin  Skin:  Reviewed, no issues  Last BM:  01/19/17  Height:   Ht Readings from Last 1 Encounters:  01/20/17  (1.88 m)    Weight:   Wt Readings from Last 1 Encounters:  01/20/17 170 lb (77.1 kg)    Ideal Body Weight:  86.4 kg  BMI:  Body mass index is 21.83  kg/m.  Estimated Nutritional Needs:   Kcal:  2100-2300 (27-30 kcal/kg)  Protein:  115-125 grams (1.5-1.6 g/kg)  Fluid:  >2.1 L/day  EDUCATION NEEDS:   Education needs addressed  Vanessa Kick RD, LDN Clinical Nutrition Pager # [phone]

## 2017-01-21 NOTE — Progress Notes (Signed)
SICKLE CELL SERVICE PROGRESS NOTE  CID AGENA ZOX:[id number] DOB: Jun 22, 1975 DOA: 01/20/2017 PCP: Fleet Contras, MD  Assessment/Plan: Active Problems:   Sickle cell crisis (HCC)  1. Hb [place] with Crisis: Will adjust PCA settings to bolus of 0.5 mg, lockout 10 minutes, 1 hour limit 3 mg. Continue Toradol at a dose of 30 mg. 2. CT findings consistent with Pneumonia vs Neoplasia: Will treat as CAP.  I have spoken with Dr. Concepcion Elk and made him aware of the need for follow up CT in 4-6 weeks to re-evaluate for resolution and/or further evaluation as needed. 3. Tobacco Use Disorder: Provide Nicotine patch.  4. Anemia of Chronic (Sickle Cell)  Disease: Hb currently stable . Will continue Hydrea. Will notify Dr. Alfonse Spruce at [facility]) of current labs.  5. Immunization; Recommended for patient with SCD. Prevnar-13 x 1 dose and PPSV-23 x 2 doses 5 years apart. Pt received last dose of PPSV-23 in 2016. His PMD will administer Prevnar in the office.   Code Status: Full Code Family Communication: N/A Disposition Plan: Not yet ready for discharge  Caci Orren A.  Pager [phone]. If 7PM-7AM, please contact night-coverage.  01/21/2017, 10:25 AM  LOS: 1 day   Interim History: Pt reports that he is an everyday smoker. He has been having cough but minimally productive and denies fevers. I have discussed the CT findings with him and I will precede as noted above.  Today he reports pain in the right posterior chest wall (under the shoulder blade) with a pleuritic component. He rates pain as 01/04/09 and poorly relieved by the use of the PCA. Pt is opiate tolerant and is currently prescribed a full dose PCA which provides less MME than he takes at home on a daily basis.    Immunizations:  PPSV23: 2016. Next dose due 2021  PCV13: due now  Consultants:  None  Procedures:  None  Antibiotics:  Ceftriaxone 9/27 >>  Azithromycin 9/27 >>    Objective: Vitals:   01/21/17 0032  01/21/17 0400 01/21/17 0510 01/21/17 0830  BP: (!) 93/54  105/69   Pulse: 66  (!) 54   Resp: Temp: 98.1 F (36.7 C)  97.8 F (36.6 C)   TempSrc: Oral  Oral   SpO2: 96% 91% 94% 95%  Weight:      Height:       Weight change:   Intake/Output Summary (Last 24 hours) at 01/21/17 1025 Last data filed at 01/21/17 0600  Gross per 24 hour  Intake           806.25 ml  Output                0 ml  Net           806.25 ml      Physical Exam General: Alert, awake, oriented x3, in no acute distress. Well appearing. HEENT: [place]/AT PEERL, EOMI, anicteric Neck: Trachea midline,  no masses, no thyromegal,y no JVD, no carotid bruit OROPHARYNX:  Moist, No exudate/ erythema/lesions.  Heart: Regular rate and rhythm, without murmurs, rubs, gallops, PMI non-displaced, no heaves or thrills on palpation.  Lungs: Clear to auscultation, no wheezing or rhonchi noted. No increased vocal fremitus resonant to percussion  Abdomen: Soft, nontender, nondistended, positive bowel sounds, no masses no hepatosplenomegaly noted.  Neuro: No focal neurological deficits noted cranial nerves II through XII grossly intact.  Strength at functional baseline in bilateral upper and lower extremities. Musculoskeletal: No warmth swelling or erythema around  joints, no spinal tenderness noted. Psychiatric: Patient alert and oriented x3, good insight and cognition, good recent to remote recall.    Data Reviewed: Basic Metabolic Panel:  Recent Labs Lab 01/20/17 1333  NA 137  K 3.6  CL 104  CO2 24  GLUCOSE 87  BUN 12  CREATININE 0.69  CALCIUM 9.3   Liver Function Tests:  Recent Labs Lab 01/20/17 1333  AST 19  ALT 12*  ALKPHOS 91  BILITOT 1.5*  PROT 8.3*  ALBUMIN 4.4   No results for input(s): LIPASE, AMYLASE in the last 168 hours. No results for input(s): AMMONIA in the last 168 hours. CBC:  Recent Labs Lab 01/20/17 1333  WBC 12.0*  NEUTROABS 7.6  HGB 9.5*  HCT 26.0*  MCV 87.2  PLT 307    Cardiac Enzymes:  Recent Labs Lab 01/20/17 1800 01/20/17 2359 01/21/17 0556  TROPONINI <0.03 <0.03 <0.03   BNP (last 3 results) No results for input(s): BNP in the last 8760 hours.  ProBNP (last 3 results) No results for input(s): PROBNP in the last 8760 hours.  CBG: No results for input(s): GLUCAP in the last 168 hours.  No results found for this or any previous visit (from the past 240 hour(s)).   Studies: Dg Chest 2 View  Result Date: 01/20/2017 CLINICAL DATA:  Cell crisis. Worsening right-sided chest pain for the past 4 days. Current smoker. EXAM: CHEST  2 VIEW COMPARISON:  Chest x-ray of Sep 17, 2016 FINDINGS: The lungs are well-expanded. The interstitial markings are mildly increased. There is no alveolar infiltrate, pleural effusion, or pneumothorax. The heart and pulmonary vascularity are normal. The bony thorax is unremarkable. IMPRESSION: Chronic interstitial prominence bilaterally. No acute pneumonia nor CHF. Electronically Signed   By: David  [place] M.D.   On: 01/20/2017 10:04   Ct Angio Chest Pe W Or Wo Contrast  Result Date: 01/20/2017 CLINICAL DATA:  Right-sided chest pain since Sunday evening, worsening with deep inspiration. Sickle-cell disease and anemia. EXAM: CT ANGIOGRAPHY CHEST WITH CONTRAST TECHNIQUE: Multidetector CT imaging of the chest was performed using the standard protocol during bolus administration of intravenous contrast. Multiplanar CT image reconstructions and MIPs were obtained to evaluate the vascular anatomy. CONTRAST:  100 cc Omnipaque 370 COMPARISON:  04/21/2016 CT FINDINGS: Cardiovascular: The study is of quality for the evaluation of pulmonary embolism. There are no filling defects in the central, lobar, segmental or subsegmental pulmonary artery branches to suggest acute pulmonary embolism. Great vessels are normal in course and caliber. Normal heart size. No significant pericardial fluid/thickening. A 4 cm ascending thoracic aneurysm is  noted. There is mild coronary arteriosclerosis. Mediastinum/Nodes: No discrete thyroid nodules. Unremarkable esophagus. No pathologically enlarged axillary, mediastinal or hilar lymph nodes. There are small subcentimeter prevascular and aorticopulmonary window lymph nodes measuring up to 9 mm, possibly reactive in etiology. Lungs/Pleura: Subpleural blebs and bullae are noted bilaterally more so at each lung base. Along the periphery of the right upper lobe are pleural based pulmonary opacities which may reflect pneumonia. The largest area of consolidation measures approximately 2.8 x 1.5 x 1.8 cm. A follow-up to assure resolution and to exclude other etiologies including neoplasm is recommended. Upper abdomen: Unremarkable. Musculoskeletal: No aggressive appearing focal osseous lesions. Sclerotic appearance of the manubrium, sternum and thoracic spine would be in keeping with the patient's history of sickle cell anemia. Review of the MIP images confirms the above findings. IMPRESSION: 1. No acute pulmonary embolus. 2. There is a pleural-based pulmonary masslike opacity in the right  upper lobe likely to represent pneumonia. A follow-up CT to ensure clearance is recommended after appropriate antibiotic therapy in 4 to 6 weeks as it is not apparent radiographically. 3. Coronary arteriosclerosis. 4. 4 cm ascending aortic aneurysm versus approximately 3.9 cm previously. Recommend annual imaging followup by CTA or MRA. This recommendation follows 2010 ACCF/AHA/AATS/ACR/ASA/SCA/SCAI/SIR/STS/SVM Guidelines for the Diagnosis and Management of Patients with Thoracic Aortic Disease. Circulation. 2010; 121: Z610-R604. 5. Subpleural blebs and bullae are noted bilaterally. Emphysema (ICD10-J43.9). Electronically Signed   By: Tollie Eth M.D.   On: 01/20/2017 18:34    Scheduled Meds: . cyclobenzaprine  10 mg Oral QHS  . enoxaparin (LOVENOX) injection  40 mg Subcutaneous Q24H  . feeding supplement (ENSURE ENLIVE)  237 mL Oral  BID BM  . folic acid  1 mg Oral QPM  . HYDROmorphone   Intravenous Q4H  .  HYDROmorphone (DILAUDID) injection  1 mg Intravenous Once  . hydroxyurea  500 mg Oral BID  . ketorolac  15 mg Intravenous Q6H  . morphine  60 mg Oral BID  . pneumococcal 23 valent vaccine  0.5 mL Intramuscular Tomorrow-1000  . senna-docusate  1 tablet Oral BID   Continuous Infusions: . sodium chloride 75 mL/hr at 01/21/17 0934  . diphenhydrAMINE (BENADRYL) IVPB(SICKLE CELL ONLY)      Active Problems:   Sickle cell crisis (HCC)    In excess of 35 minutes spent during this visit. Greater than 50% involved face to face contact with the patient for assessment, counseling and coordination of care.

## 2017-01-22 DIAGNOSIS — D5700 Hb-SS disease with crisis, unspecified: Principal | ICD-10-CM

## 2017-01-22 DIAGNOSIS — K5900 Constipation, unspecified: Secondary | ICD-10-CM

## 2017-01-22 LAB — BLOOD CULTURE ID PANEL (REFLEXED)
Acinetobacter baumannii: NOT DETECTED
CANDIDA GLABRATA: NOT DETECTED
CANDIDA TROPICALIS: NOT DETECTED
Candida albicans: NOT DETECTED
Candida krusei: NOT DETECTED
Candida parapsilosis: NOT DETECTED
ENTEROBACTER CLOACAE COMPLEX: NOT DETECTED
ENTEROCOCCUS SPECIES: NOT DETECTED
Enterobacteriaceae species: NOT DETECTED
Escherichia coli: NOT DETECTED
Haemophilus influenzae: NOT DETECTED
KLEBSIELLA PNEUMONIAE: NOT DETECTED
Klebsiella oxytoca: NOT DETECTED
LISTERIA MONOCYTOGENES: NOT DETECTED
Methicillin resistance: NOT DETECTED
NEISSERIA MENINGITIDIS: NOT DETECTED
PROTEUS SPECIES: NOT DETECTED
Pseudomonas aeruginosa: NOT DETECTED
SERRATIA MARCESCENS: NOT DETECTED
STAPHYLOCOCCUS AUREUS BCID: NOT DETECTED
STAPHYLOCOCCUS SPECIES: DETECTED — AB
STREPTOCOCCUS AGALACTIAE: NOT DETECTED
STREPTOCOCCUS SPECIES: NOT DETECTED
Streptococcus pneumoniae: NOT DETECTED
Streptococcus pyogenes: NOT DETECTED

## 2017-01-22 LAB — EXPECTORATED SPUTUM ASSESSMENT W REFEX TO RESP CULTURE

## 2017-01-22 LAB — EXPECTORATED SPUTUM ASSESSMENT W GRAM STAIN, RFLX TO RESP C: Special Requests: NORMAL

## 2017-01-22 LAB — HIV ANTIBODY (ROUTINE TESTING W REFLEX): HIV Screen 4th Generation wRfx: NONREACTIVE

## 2017-01-22 NOTE — Progress Notes (Signed)
PHARMACY - PHYSICIAN COMMUNICATION CRITICAL VALUE ALERT - BLOOD CULTURE IDENTIFICATION (BCID)  Results for orders placed or performed during the hospital encounter of 01/20/17  Blood Culture ID Panel (Reflexed) (Collected: 01/21/2017 11:27 AM)  Result Value Ref Range   Enterococcus species NOT DETECTED NOT DETECTED   Listeria monocytogenes NOT DETECTED NOT DETECTED   Staphylococcus species DETECTED (A) NOT DETECTED   Staphylococcus aureus NOT DETECTED NOT DETECTED   Methicillin resistance NOT DETECTED NOT DETECTED   Streptococcus species NOT DETECTED NOT DETECTED   Streptococcus agalactiae NOT DETECTED NOT DETECTED   Streptococcus pneumoniae NOT DETECTED NOT DETECTED   Streptococcus pyogenes NOT DETECTED NOT DETECTED   Acinetobacter baumannii NOT DETECTED NOT DETECTED   Enterobacteriaceae species NOT DETECTED NOT DETECTED   Enterobacter cloacae complex NOT DETECTED NOT DETECTED   Escherichia coli NOT DETECTED NOT DETECTED   Klebsiella oxytoca NOT DETECTED NOT DETECTED   Klebsiella pneumoniae NOT DETECTED NOT DETECTED   Proteus species NOT DETECTED NOT DETECTED   Serratia marcescens NOT DETECTED NOT DETECTED   Haemophilus influenzae NOT DETECTED NOT DETECTED   Neisseria meningitidis NOT DETECTED NOT DETECTED   Pseudomonas aeruginosa NOT DETECTED NOT DETECTED   Candida albicans NOT DETECTED NOT DETECTED   Candida glabrata NOT DETECTED NOT DETECTED   Candida krusei NOT DETECTED NOT DETECTED   Candida parapsilosis NOT DETECTED NOT DETECTED   Candida tropicalis NOT DETECTED NOT DETECTED    Name of physician (or Provider) Contacted: Dr. Ashley Royalty  Changes to prescribed antibiotics required: No change  Dannielle Huh 01/22/2017  12:31 PM

## 2017-01-22 NOTE — Progress Notes (Signed)
SICKLE CELL SERVICE PROGRESS NOTE  [name] ZOX:[id number] DOB: [birth year] DOA: 01/20/2017 PCP: Fleet Contras, MD  Assessment/Plan: Active Problems:   Sickle cell crisis (HCC)  1. Hb [place] with Crisis: Continue PCA settings to bolus of 0.5 mg, lockout 10 minutes, 1 hour limit 3 mg. Continue Toradol at a dose of 30 mg. 2. Pneumonia vs Neoplasia: Will treat as CAP Continue Rocephin and Azithromycin and consider changing to oral antibiotics if no change in indices. 3. Tobacco Use Disorder: Continue Nicotine patch.  4. Anemia of Chronic (Sickle Cell)  Disease: Hb currently stable . Will continue Hydrea. Will notify [facility]. Alfonse Spruce at [facility]) of current labs.  5. Immunization; Recommended for patient with SCD. Prevnar-13 x 1 dose and PPSV-23 x 2 doses 5 years apart. Pt received last dose of PPSV-23 in 2016. Received Prevnar today.  6. Nausea: Pt had significant nausea yesterday but this has improved with alternating Zofran and Phenergan.  7. Constipation: Pt receiving Senna-S. If no BM by tomorrow would give Lactulose or Magnesium Citrate.   Code Status: Full Code Family Communication: N/A Disposition Plan: Not yet ready for discharge  [name],[name]  Pager [phone]. If 7PM-7AM, please contact night-coverage.  01/22/2017, 7:06 PM  LOS: 2 days   Interim History: Pt reports that he is an everyday smoker. He has been having cough but minimally productive and denies fevers. I have discussed the CT findings with him and I will precede as noted above.   Today he reports pain in the right posterior chest wall (under the shoulder blade) with a pleuritic component at an intensity of 7/10.  Immunizations:  PPSV23: 2016. Next dose due 2021  PCV13: 01/22/2017  Consultants:  None  Procedures:  None  Antibiotics:  Ceftriaxone 9/27 >>  Azithromycin 9/27 >>    Objective: Vitals:   01/22/17 0800 01/22/17 1128 01/22/17 1530 01/22/17 1600  BP:   137/89   Pulse:   85    Resp: Temp:   97.7 F (36.5 C)   TempSrc:   Oral   SpO2: 91% 91% 92% 92%  Weight:      Height:       Weight change:   Intake/Output Summary (Last 24 hours) at 01/22/17 1906 Last data filed at 01/22/17 0900  Gross per 24 hour  Intake          1328.75 ml  Output             2450 ml  Net         -1121.25 ml      Physical Exam General: Alert, awake, oriented x3, in no acute distress. Well appearing. HEENT: [place]/AT PEERL, EOMI, anicteric Neck: Trachea midline,  no masses, no thyromegal,y no JVD, no carotid bruit OROPHARYNX:  Moist, No exudate/ erythema/lesions.  Heart: Regular rate and rhythm, without murmurs, rubs, gallops, PMI non-displaced, no heaves or thrills on palpation.  Lungs: Clear to auscultation, no wheezing or rhonchi noted. No increased vocal fremitus resonant to percussion  Abdomen: Soft, nontender, nondistended, positive bowel sounds, no masses no hepatosplenomegaly noted.  Neuro: No focal neurological deficits noted cranial nerves II through XII grossly intact.  Strength at functional baseline in bilateral upper and lower extremities. Musculoskeletal: No warmth swelling or erythema around joints, no spinal tenderness noted. Psychiatric: Patient alert and oriented x3, good insight and cognition, good recent to remote recall.    Data Reviewed: Basic Metabolic Panel:  Recent Labs Lab 01/20/17 1333  NA 137  K 3.6  CL 104  CO2 24  GLUCOSE 87  BUN 12  CREATININE 0.69  CALCIUM 9.3   Liver Function Tests:  Recent Labs Lab 01/20/17 1333  AST 19  ALT 12*  ALKPHOS 91  BILITOT 1.5*  PROT 8.3*  ALBUMIN 4.4   No results for input(s): LIPASE, AMYLASE in the last 168 hours. No results for input(s): AMMONIA in the last 168 hours. CBC:  Recent Labs Lab 01/20/17 1333  WBC 12.0*  NEUTROABS 7.6  HGB 9.5*  HCT 26.0*  MCV 87.2  PLT 307   Cardiac Enzymes:  Recent Labs Lab 01/20/17 1800 01/20/17 2359 01/21/17 0556  TROPONINI <0.03  <0.03 <0.03   BNP (last 3 results) No results for input(s): BNP in the last 8760 hours.  ProBNP (last 3 results) No results for input(s): PROBNP in the last 8760 hours.  CBG: No results for input(s): GLUCAP in the last 168 hours.  Recent Results (from the past 240 hour(s))  Culture, blood (routine x 2) Call MD if unable to obtain prior to antibiotics being given     Status: None (Preliminary result)   Collection Time: 01/21/17 11:27 AM  Result Value Ref Range Status   Specimen Description BLOOD RIGHT HAND  Final   Special Requests IN PEDIATRIC BOTTLE Blood Culture adequate volume  Final   Culture  Setup Time   Final    GRAM POSITIVE COCCI IN CLUSTERS IN PEDIATRIC BOTTLE Organism ID to follow CRITICAL RESULT CALLED TO, READ BACK BY AND VERIFIED WITH: J. Teodoro Kil Pharm.D. 12:05 01/22/17 ([email or web address])    Culture GRAM POSITIVE COCCI  Final   Report Status PENDING  Incomplete  Blood Culture ID Panel (Reflexed)     Status: Abnormal   Collection Time: 01/21/17 11:27 AM  Result Value Ref Range Status   Enterococcus species NOT DETECTED NOT DETECTED Final   Listeria monocytogenes NOT DETECTED NOT DETECTED Final   Staphylococcus species DETECTED (A) NOT DETECTED Final    Comment: Methicillin (oxacillin) susceptible coagulase negative staphylococcus. Possible blood culture contaminant (unless isolated from more than one blood culture draw or clinical case suggests pathogenicity). No antibiotic treatment is indicated for blood  culture contaminants. CRITICAL RESULT CALLED TO, READ BACK BY AND VERIFIED WITH: J. Teodoro Kil Pharm.D. 12:05 01/22/17 ([email or web address])    Staphylococcus aureus NOT DETECTED NOT DETECTED Final   Methicillin resistance NOT DETECTED NOT DETECTED Final   Streptococcus species NOT DETECTED NOT DETECTED Final   Streptococcus agalactiae NOT DETECTED NOT DETECTED Final   Streptococcus pneumoniae NOT DETECTED NOT DETECTED Final   Streptococcus pyogenes NOT DETECTED NOT DETECTED Final    Acinetobacter baumannii NOT DETECTED NOT DETECTED Final   Enterobacteriaceae species NOT DETECTED NOT DETECTED Final   Enterobacter cloacae complex NOT DETECTED NOT DETECTED Final   Escherichia coli NOT DETECTED NOT DETECTED Final   Klebsiella oxytoca NOT DETECTED NOT DETECTED Final   Klebsiella pneumoniae NOT DETECTED NOT DETECTED Final   Proteus species NOT DETECTED NOT DETECTED Final   Serratia marcescens NOT DETECTED NOT DETECTED Final   Haemophilus influenzae NOT DETECTED NOT DETECTED Final   Neisseria meningitidis NOT DETECTED NOT DETECTED Final   Pseudomonas aeruginosa NOT DETECTED NOT DETECTED Final   Candida albicans NOT DETECTED NOT DETECTED Final   Candida glabrata NOT DETECTED NOT DETECTED Final   Candida krusei NOT DETECTED NOT DETECTED Final   Candida parapsilosis NOT DETECTED NOT DETECTED Final   Candida tropicalis NOT DETECTED NOT DETECTED Final    Comment: Performed at [facility]  [facility], [address] [place], [place] [address]  Culture, blood (routine x 2) Call MD if unable to obtain prior to antibiotics being given     Status: None (Preliminary result)   Collection Time: 01/21/17 11:32 AM  Result Value Ref Range Status   Specimen Description BLOOD RIGHT HAND  Final   Special Requests   Final    BOTTLES DRAWN AEROBIC ONLY Blood Culture adequate volume   Culture   Final    NO GROWTH < 24 HOURS Performed at [facility], [address] [place], [place] [address]    Report Status PENDING  Incomplete  Culture, sputum-assessment     Status: None   Collection Time: 01/22/17  6:10 AM  Result Value Ref Range Status   Specimen Description SPUTUM  Final   Special Requests Normal  Final   Sputum evaluation THIS SPECIMEN IS ACCEPTABLE FOR SPUTUM CULTURE  Final   Report Status 01/22/2017 FINAL  Final  Culture, respiratory (NON-Expectorated)     Status: None (Preliminary result)   Collection Time: 01/22/17  6:10 AM  Result Value Ref Range Status   Specimen  Description SPUTUM  Final   Special Requests Normal Reflexed from [id number]  Final   Gram Stain   Final    FEW WBC PRESENT, PREDOMINANTLY PMN RARE SQUAMOUS EPITHELIAL CELLS PRESENT ABUNDANT GRAM POSITIVE COCCI IN PAIRS FEW GRAM NEGATIVE RODS FEW GRAM POSITIVE RODS Performed at [facility], [address], [place], [place] [address]    Culture PENDING  Incomplete   Report Status PENDING  Incomplete     Studies: Dg Chest 2 View  Result Date: 01/20/2017 CLINICAL DATA:  Cell crisis. Worsening right-sided chest pain for the past 4 days. Current smoker. EXAM: CHEST  2 VIEW COMPARISON:  Chest x-ray of Sep 17, 2016 FINDINGS: The lungs are well-expanded. The interstitial markings are mildly increased. There is no alveolar infiltrate, pleural effusion, or pneumothorax. The heart and pulmonary vascularity are normal. The bony thorax is unremarkable. IMPRESSION: Chronic interstitial prominence bilaterally. No acute pneumonia nor CHF. Electronically Signed   By: David  [place]aziland M.D.   On: 01/20/2017 10:04   Ct Angio Chest Pe W Or Wo Contrast  Result Date: 01/20/2017 CLINICAL DATA:  Right-sided chest pain since Sunday evening, worsening with deep inspiration. Sickle-cell disease and anemia. EXAM: CT ANGIOGRAPHY CHEST WITH CONTRAST TECHNIQUE: Multidetector CT imaging of the chest was performed using the standard protocol during bolus administration of intravenous contrast. Multiplanar CT image reconstructions and MIPs were obtained to evaluate the vascular anatomy. CONTRAST:  100 cc Omnipaque 370 COMPARISON:  04/21/2016 CT FINDINGS: Cardiovascular: The study is of quality for the evaluation of pulmonary embolism. There are no filling defects in the central, lobar, segmental or subsegmental pulmonary artery branches to suggest acute pulmonary embolism. Great vessels are normal in course and caliber. Normal heart size. No significant pericardial fluid/thickening. A 4 cm ascending thoracic aneurysm is noted.  There is mild coronary arteriosclerosis. Mediastinum/Nodes: No discrete thyroid nodules. Unremarkable esophagus. No pathologically enlarged axillary, mediastinal or hilar lymph nodes. There are small subcentimeter prevascular and aorticopulmonary window lymph nodes measuring up to 9 mm, possibly reactive in etiology. Lungs/Pleura: Subpleural blebs and bullae are noted bilaterally more so at each lung base. Along the periphery of the right upper lobe are pleural based pulmonary opacities which may reflect pneumonia. The largest area of consolidation measures approximately 2.8 x 1.5 x 1.8 cm. A follow-up to assure resolution and to exclude other etiologies including  neoplasm is recommended. Upper abdomen: Unremarkable. Musculoskeletal: No aggressive appearing focal osseous lesions. Sclerotic appearance of the manubrium, sternum and thoracic spine would be in keeping with the patient's history of sickle cell anemia. Review of the MIP images confirms the above findings. IMPRESSION: 1. No acute pulmonary embolus. 2. There is a pleural-based pulmonary masslike opacity in the right upper lobe likely to represent pneumonia. A follow-up CT to ensure clearance is recommended after appropriate antibiotic therapy in 4 to 6 weeks as it is not apparent radiographically. 3. Coronary arteriosclerosis. 4. 4 cm ascending aortic aneurysm versus approximately 3.9 cm previously. Recommend annual imaging followup by CTA or MRA. This recommendation follows 2010 ACCF/AHA/AATS/ACR/ASA/SCA/SCAI/SIR/STS/SVM Guidelines for the Diagnosis and Management of Patients with Thoracic Aortic Disease. Circulation. 2010; 121: Z610-R604. 5. Subpleural blebs and bullae are noted bilaterally. Emphysema (ICD10-J43.9). Electronically Signed   By: Tollie Eth M.D.   On: 01/20/2017 18:34    Scheduled Meds: . azithromycin  500 mg Oral Q24H  . cyclobenzaprine  10 mg Oral QHS  . enoxaparin (LOVENOX) injection  40 mg Subcutaneous Q24H  . feeding supplement  (ENSURE ENLIVE)  237 mL Oral BID BM  . folic acid  1 mg Oral QPM  . HYDROmorphone   Intravenous Q4H  . hydroxyurea  1,000 mg Oral Daily  . ketorolac  30 mg Intravenous Q6H  . morphine  60 mg Oral BID  . nicotine  14 mg Transdermal Daily  . senna-docusate  1 tablet Oral BID   Continuous Infusions: . sodium chloride 75 mL/hr at 01/22/17 1129  . cefTRIAXone (ROCEPHIN)  IV Stopped (01/22/17 1307)  . diphenhydrAMINE (BENADRYL) IVPB(SICKLE CELL ONLY)      Active Problems:   Sickle cell crisis (HCC)    In excess of 25 minutes spent during this visit. Greater than 50% involved face to face contact with the patient for assessment, counseling and coordination of care.

## 2017-01-22 NOTE — Plan of Care (Signed)
Problem: Education: Goal: Knowledge of [facility] Education information/materials will improve Outcome: Completed/Met Date Met: 01/22/17 .  Problem: Safety: Goal: Ability to remain free from injury will improve Outcome: Completed/Met Date Met: 01/22/17 .  Problem: Health Behavior/Discharge Planning: Goal: Ability to manage health-related needs will improve Outcome: Progressing .  Problem: Pain Managment: Goal: General experience of comfort will improve Outcome: Progressing .  Problem: Physical Regulation: Goal: Ability to maintain clinical measurements within normal limits will improve Outcome: Progressing . Goal: Will remain free from infection Outcome: Progressing .  Problem: Tissue Perfusion: Goal: Risk factors for ineffective tissue perfusion will decrease Outcome: Progressing .  Problem: Activity: Goal: Risk for activity intolerance will decrease Outcome: Progressing .  Problem: Fluid Volume: Goal: Ability to maintain a balanced intake and output will improve Outcome: Completed/Met Date Met: 01/22/17 .  Problem: Nutrition: Goal: Adequate nutrition will be maintained Outcome: Completed/Met Date Met: 01/22/17 .

## 2017-01-23 LAB — CBC WITH DIFFERENTIAL/PLATELET
BASOS PCT: 1 %
Basophils Absolute: 0.1 10*3/uL (ref 0.0–0.1)
EOS PCT: 6 %
Eosinophils Absolute: 0.8 10*3/uL — ABNORMAL HIGH (ref 0.0–0.7)
HEMATOCRIT: 27.6 % — AB (ref 39.0–52.0)
Hemoglobin: 9.9 g/dL — ABNORMAL LOW (ref 13.0–17.0)
LYMPHS ABS: 2.5 10*3/uL (ref 0.7–4.0)
Lymphocytes Relative: 20 %
MCH: 32 pg (ref 26.0–34.0)
MCHC: 35.9 g/dL (ref 30.0–36.0)
MCV: 89.3 fL (ref 78.0–100.0)
MONOS PCT: 8 %
Monocytes Absolute: 1 10*3/uL (ref 0.1–1.0)
NEUTROS ABS: 8.1 10*3/uL — AB (ref 1.7–7.7)
Neutrophils Relative %: 65 %
Platelets: 328 10*3/uL (ref 150–400)
RBC: 3.09 MIL/uL — ABNORMAL LOW (ref 4.22–5.81)
RDW: 15.2 % (ref 11.5–15.5)
WBC: 12.5 10*3/uL — ABNORMAL HIGH (ref 4.0–10.5)

## 2017-01-23 LAB — BASIC METABOLIC PANEL
ANION GAP: 9 (ref 5–15)
BUN: 11 mg/dL (ref 6–20)
CALCIUM: 9.2 mg/dL (ref 8.9–10.3)
CO2: 26 mmol/L (ref 22–32)
Chloride: 104 mmol/L (ref 101–111)
Creatinine, Ser: 0.73 mg/dL (ref 0.61–1.24)
Glucose, Bld: 68 mg/dL (ref 65–99)
Potassium: 5.3 mmol/L — ABNORMAL HIGH (ref 3.5–5.1)
Sodium: 139 mmol/L (ref 135–145)

## 2017-01-23 LAB — RETICULOCYTES
RBC.: 3.09 MIL/uL — ABNORMAL LOW (ref 4.22–5.81)
Retic Count, Absolute: 154.5 10*3/uL (ref 19.0–186.0)
Retic Ct Pct: 5 % — ABNORMAL HIGH (ref 0.4–3.1)

## 2017-01-23 NOTE — Discharge Summary (Signed)
Physician Discharge Summary  Patient ID: [name] MRN: [id number] DOB/AGE: 1975-10-21 41 y.o.  Admit date: 01/20/2017 Discharge date: 01/23/2017  Admission Diagnoses:  Discharge Diagnoses:  Active Problems:   Sickle cell crisis [facility])   Discharged Condition: good  Hospital Course: patient was admitted with sickle cell painful crisis as well as community-acquired pneumonia. He was placed on Dilaudid PCA with Toradol and IV fluids. He did much better. Pain was fully controlled and at the time of discharge was down to 4 out of 10. His chest findings were suggestive of pneumonia versus new aplasia. He was treated as can be Quite pneumonia with Rocephin and Zithromax. Later transitioned to oral Levaquin. He has done much better and no fever or chills at time of discharge. He did also have nausea was constipation that was addressed. Patient received immunization was Prevnar 13 as well.   Consults: None  Significant Diagnostic Studies: labs: CBCs and CMPs checked. Patient did not require any transfusion. He will follow-up with his oncologist at [facility]  Treatments: IV hydration and analgesia: Dilaudid  Discharge Exam: Blood pressure 138/86, pulse 75, temperature 98.1 F (36.7 C), temperature source Oral, resp. rate 12, height  (1.88 m), weight 76.4 kg (168 lb 6.9 oz), SpO2 95 %. General appearance: alert, cooperative, appears stated age and no distress Head: Normocephalic, without obvious abnormality, atraumatic Neck: no adenopathy, no carotid bruit, no JVD, supple, symmetrical, trachea midline and thyroid not enlarged, symmetric, no tenderness/mass/nodules Back: symmetric, no curvature. ROM normal. No CVA tenderness. Resp: clear to auscultation bilaterally Cardio: regular rate and rhythm, S1, S2 normal, no murmur, click, rub or gallop GI: soft, non-tender; bowel sounds normal; no masses,  no organomegaly Extremities: extremities normal, atraumatic, no cyanosis or edema Pulses: 2+  and symmetric Skin: Skin color, texture, turgor normal. No rashes or lesions Neurologic: Grossly normal  Disposition: 01-Home or Self Care  Discharge Instructions    Diet - low sodium heart healthy    Complete by:  As directed    Increase activity slowly    Complete by:  As directed      Allergies as of 01/23/2017   No Known Allergies     Medication List    TAKE these medications   cyclobenzaprine 10 MG tablet Commonly known as:  FLEXERIL Take 10 mg by mouth at bedtime.   folic acid 1 MG tablet Commonly known as:  FOLVITE Take 1 mg by mouth every evening.   hydroxyurea 500 MG capsule Commonly known as:  HYDREA Take 500 mg by mouth 2 (two) times daily. May take with food to minimize GI side effects.   ibuprofen 800 MG tablet Commonly known as:  ADVIL,MOTRIN Take 800 mg by mouth every 8 (eight) hours as needed for moderate pain.   morphine 30 MG tablet Commonly known as:  MSIR Take 1 tablet (30 mg total) by mouth daily as needed for severe pain (pain). Resume after completing dilaudid. What changed:  Another medication with the same name was removed. Continue taking this medication, and follow the directions you see here.   morphine 60 MG 12 hr tablet Commonly known as:  MS CONTIN Take 60 mg by mouth 2 (two) times daily. What changed:  Another medication with the same name was removed. Continue taking this medication, and follow the directions you see here.   promethazine 25 MG tablet Commonly known as:  PHENERGAN Take 25 mg by mouth every 6 (six) hours as needed for nausea (nausea).  Discharge Care Instructions        Start     Ordered   01/23/17 0000  Increase activity slowly     01/23/17 1210   01/23/17 0000  Diet - low sodium heart healthy     01/23/17 1210       Signed: GARBA,[name] 01/23/2017, 12:10 PM   Time spent 34 minutes

## 2017-01-23 NOTE — Progress Notes (Signed)
Pt did not wait to be dc'd in a wheel chair. He wwalked out to meet his ride. Gait is stable and he verbalized understanding of dc instructions

## 2017-01-24 LAB — CULTURE, RESPIRATORY W GRAM STAIN: Culture: NORMAL

## 2017-01-24 LAB — CULTURE, RESPIRATORY: SPECIAL REQUESTS: NORMAL

## 2017-01-26 LAB — CULTURE, BLOOD (ROUTINE X 2)
CULTURE: NO GROWTH
SPECIAL REQUESTS: ADEQUATE

## 2017-02-03 LAB — SUSCEPTIBILITY, AER + ANAEROB

## 2017-02-03 LAB — SUSCEPTIBILITY RESULT

## 2017-02-04 LAB — CULTURE, BLOOD (ROUTINE X 2): Special Requests: ADEQUATE

## 2017-10-20 IMAGING — DX DG CHEST 2V
2 series · 2 of 2 positions shown · non-contrast
Comparison: 05/25/2015

CLINICAL DATA: Sickle cell pain crisis; SOB and generalized body
pain today; hx PNA; smoker

EXAM:
CHEST  2 VIEW

[chest pa]
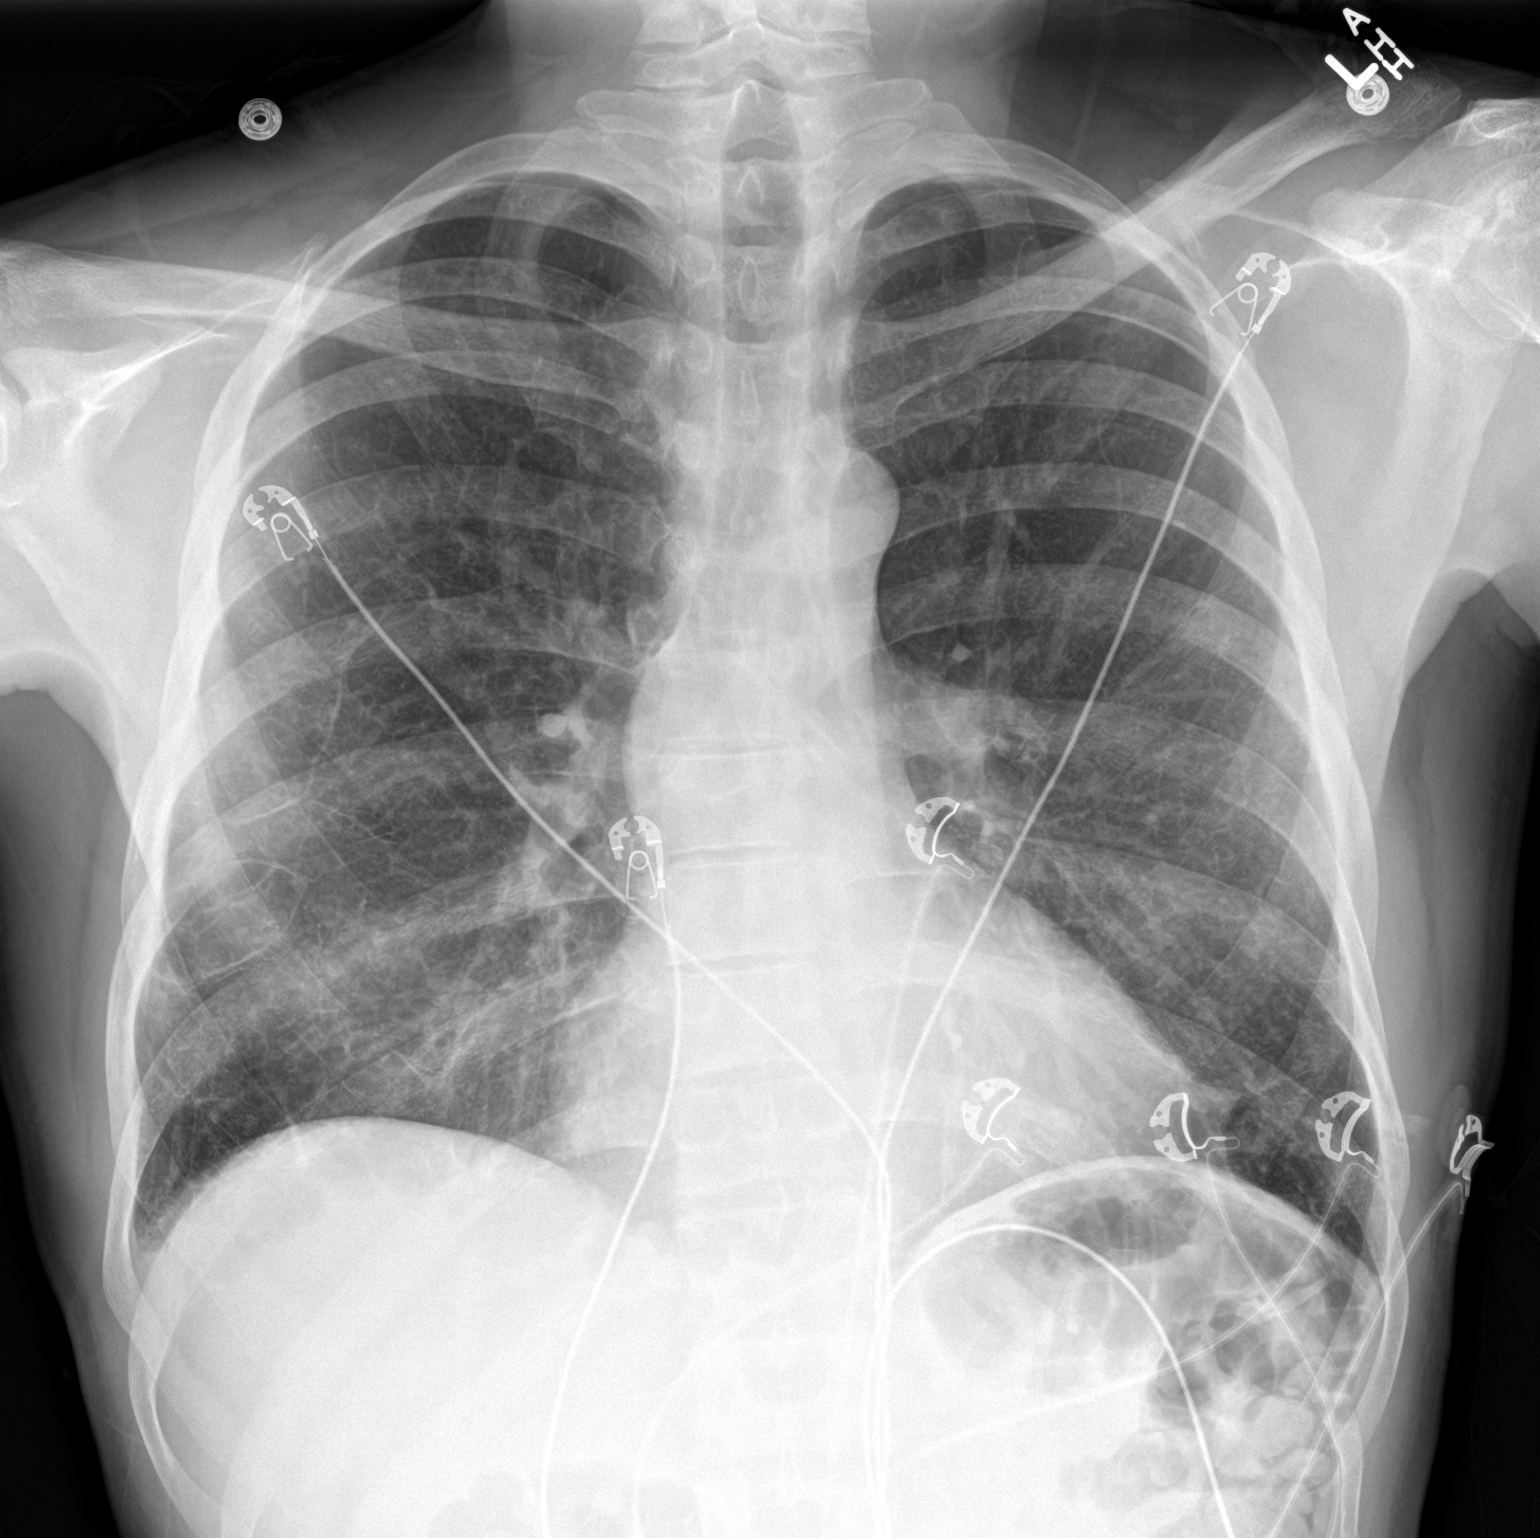

[chest lat]
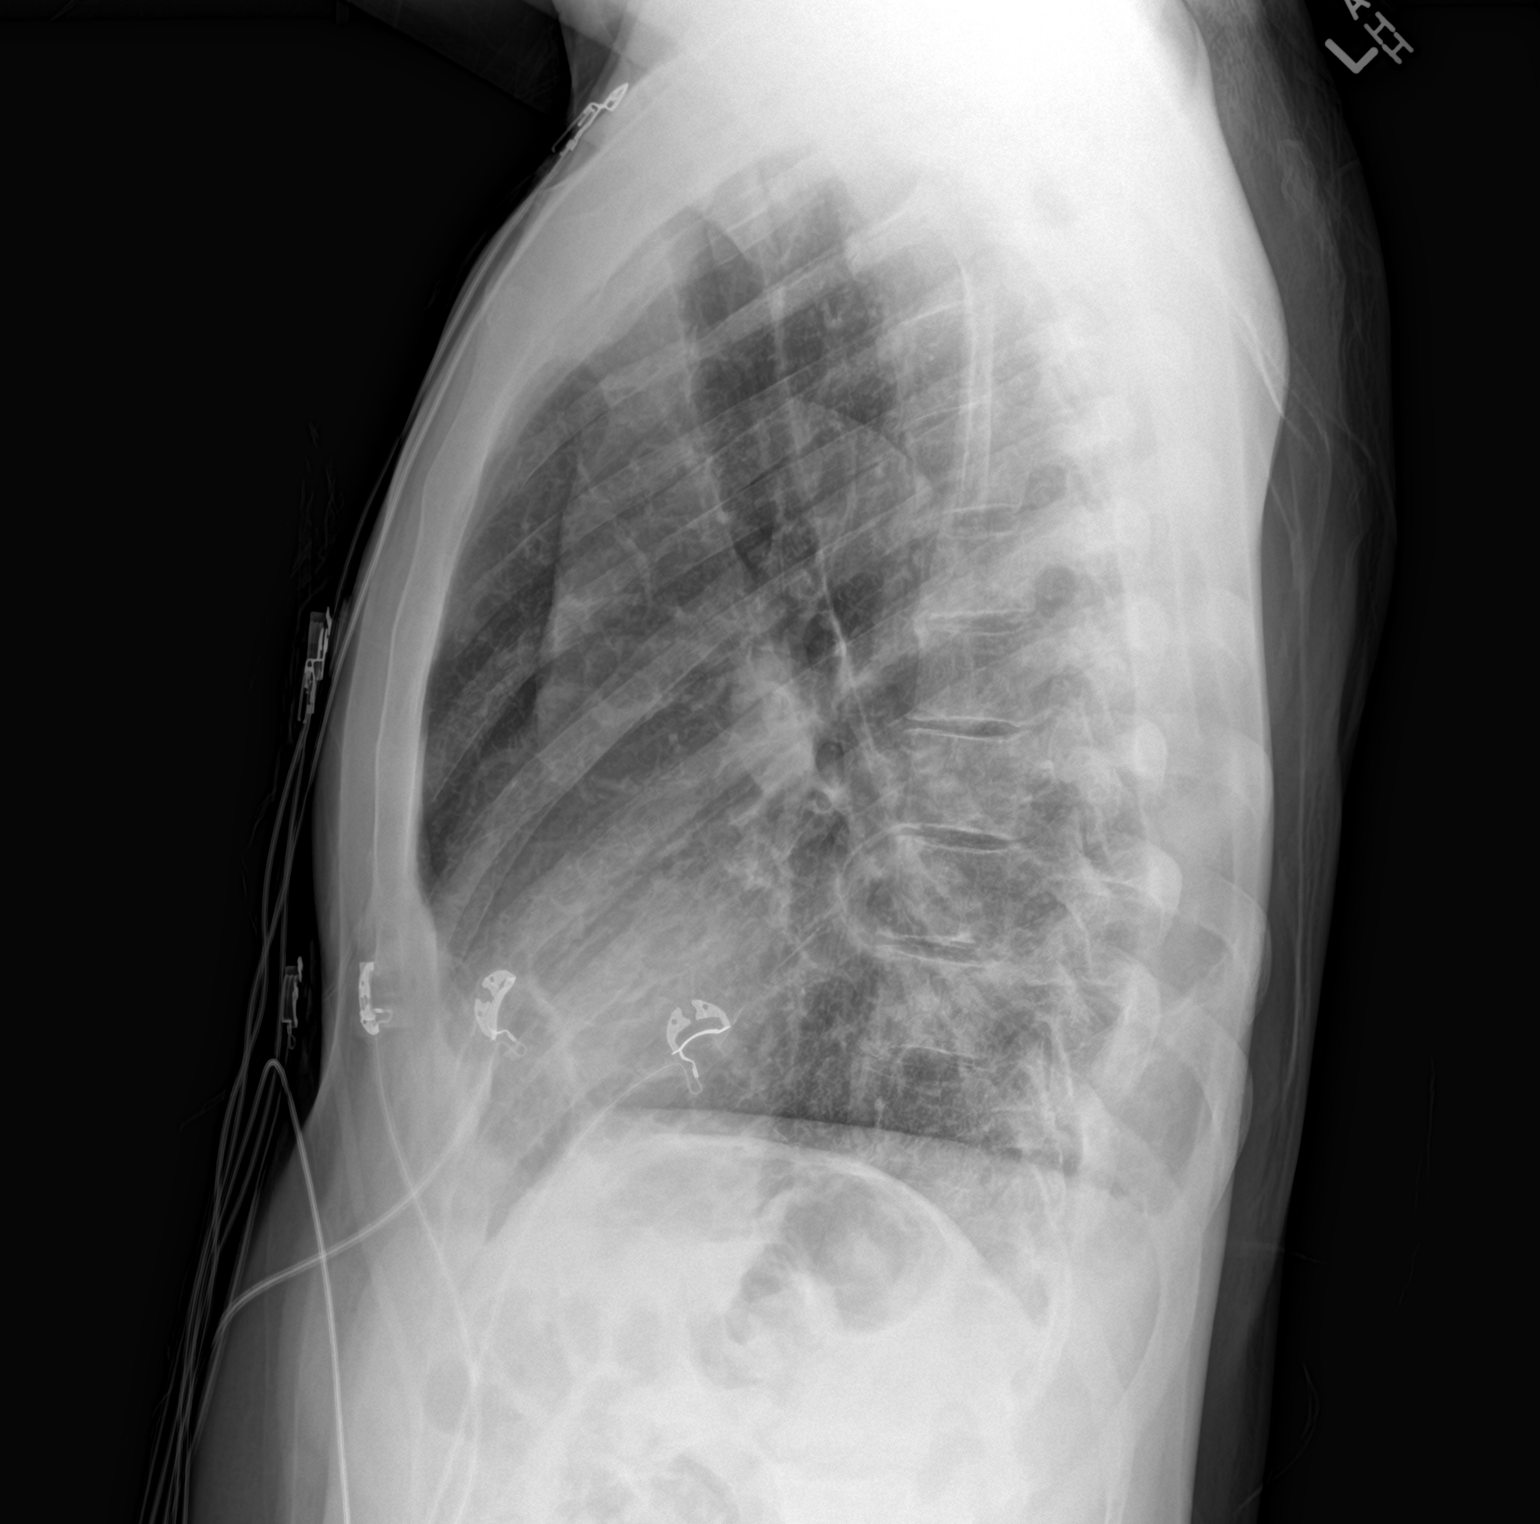

[2 of 2 positions shown; findings below may reference images not displayed]

FINDINGS: Heart size is normal. Emphysematous changes are present. Chronic
bronchitic changes are noted. There are no focal consolidations or
pleural effusions. No pulmonary edema.
IMPRESSION: 1. Chronic emphysematous and bronchitic changes.
2.  No evidence for acute  abnormality.

## 2018-01-22 ENCOUNTER — Other Ambulatory Visit: Payer: Self-pay

## 2018-01-22 ENCOUNTER — Inpatient Hospital Stay (HOSPITAL_COMMUNITY)
Admission: EM | Admit: 2018-01-22 | Discharge: 2018-01-25 | DRG: 812 | Disposition: A | Discharge status: Home / Self Care | Payer: Medicare Other | Attending: Internal Medicine | Admitting: Internal Medicine

## 2018-01-22 DIAGNOSIS — M879 Osteonecrosis, unspecified: Secondary | ICD-10-CM | POA: Diagnosis present

## 2018-01-22 DIAGNOSIS — G894 Chronic pain syndrome: Secondary | ICD-10-CM | POA: Diagnosis present

## 2018-01-22 DIAGNOSIS — D72829 Elevated white blood cell count, unspecified: Secondary | ICD-10-CM | POA: Diagnosis present

## 2018-01-22 DIAGNOSIS — F17210 Nicotine dependence, cigarettes, uncomplicated: Secondary | ICD-10-CM | POA: Diagnosis present

## 2018-01-22 DIAGNOSIS — Z96641 Presence of right artificial hip joint: Secondary | ICD-10-CM | POA: Diagnosis present

## 2018-01-22 DIAGNOSIS — Z79899 Other long term (current) drug therapy: Secondary | ICD-10-CM

## 2018-01-22 DIAGNOSIS — Z79891 Long term (current) use of opiate analgesic: Secondary | ICD-10-CM

## 2018-01-22 DIAGNOSIS — M87052 Idiopathic aseptic necrosis of left femur: Secondary | ICD-10-CM

## 2018-01-22 DIAGNOSIS — Z23 Encounter for immunization: Secondary | ICD-10-CM | POA: Diagnosis not present

## 2018-01-22 DIAGNOSIS — Z87891 Personal history of nicotine dependence: Secondary | ICD-10-CM | POA: Diagnosis not present

## 2018-01-22 DIAGNOSIS — D571 Sickle-cell disease without crisis: Secondary | ICD-10-CM | POA: Diagnosis present

## 2018-01-22 DIAGNOSIS — D5700 Hb-SS disease with crisis, unspecified: Secondary | ICD-10-CM | POA: Diagnosis present

## 2018-01-22 DIAGNOSIS — D638 Anemia in other chronic diseases classified elsewhere: Secondary | ICD-10-CM | POA: Diagnosis present

## 2018-01-22 LAB — CBC WITH DIFFERENTIAL/PLATELET
Basophils Absolute: 0.1 10*3/uL (ref 0.0–0.1)
Basophils Relative: 1 %
EOS PCT: 6 %
Eosinophils Absolute: 0.8 10*3/uL — ABNORMAL HIGH (ref 0.0–0.7)
HEMATOCRIT: 28.6 % — AB (ref 39.0–52.0)
Hemoglobin: 10.4 g/dL — ABNORMAL LOW (ref 13.0–17.0)
LYMPHS PCT: 23 %
Lymphs Abs: 3.1 10*3/uL (ref 0.7–4.0)
MCH: 33.9 pg (ref 26.0–34.0)
MCHC: 36.4 g/dL — AB (ref 30.0–36.0)
MCV: 93.2 fL (ref 78.0–100.0)
MONO ABS: 1.6 10*3/uL — AB (ref 0.1–1.0)
MONOS PCT: 12 %
Neutro Abs: 8 10*3/uL — ABNORMAL HIGH (ref 1.7–7.7)
Neutrophils Relative %: 58 %
PLATELETS: 307 10*3/uL (ref 150–400)
RBC: 3.07 MIL/uL — ABNORMAL LOW (ref 4.22–5.81)
RDW: 13.8 % (ref 11.5–15.5)
WBC: 13.6 10*3/uL — ABNORMAL HIGH (ref 4.0–10.5)

## 2018-01-22 LAB — RETICULOCYTES
RBC.: 3.07 MIL/uL — ABNORMAL LOW (ref 4.22–5.81)
RETIC COUNT ABSOLUTE: 82.9 10*3/uL (ref 19.0–186.0)
Retic Ct Pct: 2.7 % (ref 0.4–3.1)

## 2018-01-22 LAB — COMPREHENSIVE METABOLIC PANEL
ALK PHOS: 76 U/L (ref 38–126)
ALT: 15 U/L (ref 0–44)
AST: 27 U/L (ref 15–41)
Albumin: 4.4 g/dL (ref 3.5–5.0)
Anion gap: 7 (ref 5–15)
BILIRUBIN TOTAL: 2 mg/dL — AB (ref 0.3–1.2)
BUN: 13 mg/dL (ref 6–20)
CALCIUM: 9.2 mg/dL (ref 8.9–10.3)
CO2: 26 mmol/L (ref 22–32)
Chloride: 107 mmol/L (ref 98–111)
Creatinine, Ser: 0.67 mg/dL (ref 0.61–1.24)
GFR calc Af Amer: >60 mL/min (ref >60)
GFR calc non Af Amer: >60 mL/min (ref >60)
GLUCOSE: 114 mg/dL — AB (ref 70–99)
POTASSIUM: 4.1 mmol/L (ref 3.5–5.1)
SODIUM: 140 mmol/L (ref 135–145)
TOTAL PROTEIN: 7.8 g/dL (ref 6.5–8.1)

## 2018-01-22 MED ORDER — KETOROLAC TROMETHAMINE 30 MG/ML IJ SOLN
30.0000 mg | INTRAMUSCULAR | Status: AC
  Administered 2018-01-22: 30 mg via INTRAVENOUS
  Filled 2018-01-22: qty 1

## 2018-01-22 MED ORDER — HYDROMORPHONE HCL 2 MG/ML IJ SOLN
2.0000 mg | INTRAMUSCULAR | Status: AC
  Administered 2018-01-22: 2 mg via INTRAVENOUS
  Filled 2018-01-22: qty 1

## 2018-01-22 MED ORDER — POLYETHYLENE GLYCOL 3350 17 G PO PACK: 17.0000 g | PACK | Freq: Every day | ORAL | Status: DC | PRN

## 2018-01-22 MED ORDER — MORPHINE SULFATE ER 30 MG PO TBCR
60.0000 mg | EXTENDED_RELEASE_TABLET | Freq: Two times a day (BID) | ORAL | Status: DC
  Administered 2018-01-22 – 2018-01-25 (×6): 60 mg via ORAL
  Filled 2018-01-22 (×6): qty 2

## 2018-01-22 MED ORDER — HYDROMORPHONE HCL 2 MG/ML IJ SOLN: 2.0000 mg | INTRAMUSCULAR | Status: AC

## 2018-01-22 MED ORDER — HYDROXYUREA 500 MG PO CAPS
500.0000 mg | ORAL_CAPSULE | Freq: Two times a day (BID) | ORAL | Status: DC
  Administered 2018-01-22 – 2018-01-25 (×6): 500 mg via ORAL
  Filled 2018-01-22 (×6): qty 1

## 2018-01-22 MED ORDER — DEXTROSE-NACL 5-0.45 % IV SOLN
INTRAVENOUS | Status: DC
  Administered 2018-01-22 – 2018-01-25 (×5): via INTRAVENOUS

## 2018-01-22 MED ORDER — SODIUM CHLORIDE 0.9% FLUSH: 9.0000 mL | INTRAVENOUS | Status: DC | PRN

## 2018-01-22 MED ORDER — KETOROLAC TROMETHAMINE 30 MG/ML IJ SOLN
30.0000 mg | Freq: Four times a day (QID) | INTRAMUSCULAR | Status: DC
  Administered 2018-01-22 – 2018-01-25 (×12): 30 mg via INTRAVENOUS
  Filled 2018-01-22 (×11): qty 1

## 2018-01-22 MED ORDER — DIPHENHYDRAMINE HCL 50 MG/ML IJ SOLN
25.0000 mg | Freq: Once | INTRAMUSCULAR | Status: AC
  Administered 2018-01-22: 25 mg via INTRAVENOUS
  Filled 2018-01-22: qty 1

## 2018-01-22 MED ORDER — FOLIC ACID 1 MG PO TABS
1.0000 mg | ORAL_TABLET | Freq: Every evening | ORAL | Status: DC
  Administered 2018-01-22 – 2018-01-24 (×3): 1 mg via ORAL
  Filled 2018-01-22 (×4): qty 1

## 2018-01-22 MED ORDER — SODIUM CHLORIDE 0.9 % IV SOLN
25.0000 mg | INTRAVENOUS | Status: DC | PRN
  Filled 2018-01-22: qty 0.5

## 2018-01-22 MED ORDER — HYDROMORPHONE 1 MG/ML IV SOLN
INTRAVENOUS | Status: DC
  Administered 2018-01-22: 15:00:00 via INTRAVENOUS
  Administered 2018-01-22: 7 mg via INTRAVENOUS
  Administered 2018-01-23: 4.5 mg via INTRAVENOUS
  Administered 2018-01-23: 5 mg via INTRAVENOUS
  Administered 2018-01-23: 3.5 mL via INTRAVENOUS
  Administered 2018-01-23: 4 mg via INTRAVENOUS
  Administered 2018-01-24: 5.5 mg via INTRAVENOUS
  Administered 2018-01-24: 20:00:00 via INTRAVENOUS
  Administered 2018-01-24: 2.5 mg via INTRAVENOUS
  Administered 2018-01-24: 7 mg via INTRAVENOUS
  Administered 2018-01-24: via INTRAVENOUS
  Administered 2018-01-24 (×2): 5 mg via INTRAVENOUS
  Administered 2018-01-25: 6 mg via INTRAVENOUS
  Filled 2018-01-22 (×4): qty 25

## 2018-01-22 MED ORDER — ENOXAPARIN SODIUM 40 MG/0.4ML ~~LOC~~ SOLN
40.0000 mg | SUBCUTANEOUS | Status: DC
  Administered 2018-01-24: 40 mg via SUBCUTANEOUS
  Filled 2018-01-22: qty 0.4

## 2018-01-22 MED ORDER — NALOXEGOL OXALATE 25 MG PO TABS
25.0000 mg | ORAL_TABLET | Freq: Every day | ORAL | Status: DC
  Administered 2018-01-24 – 2018-01-25 (×2): 25 mg via ORAL
  Filled 2018-01-22 (×4): qty 1

## 2018-01-22 MED ORDER — SODIUM CHLORIDE 0.45 % IV SOLN
INTRAVENOUS | Status: DC
  Administered 2018-01-22: 09:00:00 via INTRAVENOUS

## 2018-01-22 MED ORDER — NALOXONE HCL 0.4 MG/ML IJ SOLN: 0.4000 mg | INTRAMUSCULAR | Status: DC | PRN

## 2018-01-22 MED ORDER — DIPHENHYDRAMINE HCL 25 MG PO CAPS: 25.0000 mg | ORAL_CAPSULE | ORAL | Status: DC | PRN

## 2018-01-22 MED ORDER — PROMETHAZINE HCL 25 MG PO TABS: 25.0000 mg | ORAL_TABLET | Freq: Four times a day (QID) | ORAL | Status: DC | PRN

## 2018-01-22 MED ORDER — CYCLOBENZAPRINE HCL 10 MG PO TABS
10.0000 mg | ORAL_TABLET | Freq: Every day | ORAL | Status: DC
  Administered 2018-01-22 – 2018-01-24 (×3): 10 mg via ORAL
  Filled 2018-01-22 (×3): qty 1

## 2018-01-22 MED ORDER — SENNOSIDES-DOCUSATE SODIUM 8.6-50 MG PO TABS
1.0000 | ORAL_TABLET | Freq: Two times a day (BID) | ORAL | Status: DC
  Administered 2018-01-22 – 2018-01-25 (×6): 1 via ORAL
  Filled 2018-01-22 (×6): qty 1

## 2018-01-22 MED ORDER — ONDANSETRON HCL 4 MG/2ML IJ SOLN: 4.0000 mg | Freq: Four times a day (QID) | INTRAMUSCULAR | Status: DC | PRN

## 2018-01-22 MED ORDER — NALOXONE HCL 4 MG/0.1ML NA LIQD: 4.0000 mg | NASAL | Status: DC | PRN

## 2018-01-22 MED ORDER — ONDANSETRON HCL 4 MG/2ML IJ SOLN
4.0000 mg | INTRAMUSCULAR | Status: DC | PRN
  Administered 2018-01-22 – 2018-01-24 (×5): 4 mg via INTRAVENOUS
  Filled 2018-01-22 (×5): qty 2

## 2018-01-22 NOTE — ED Notes (Addendum)
Spoke with 5E- bed is ready. Waiting on hold to give report. On hold for 3 min. Will attempt to call back.

## 2018-01-22 NOTE — ED Provider Notes (Signed)
[facility]-EMERGENCY DEPT Provider Note   CSN: [id number] Arrival date & time: 01/22/18  0827     History   Chief Complaint Chief Complaint  Patient presents with  . Sickle Cell Pain Crisis    HPI [name] is a 42 y.o. male.  [name] is a 42 y.o. 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  He reports over the last 3 days he has had worsening pain in his left hip and left shoulder.  He reports this is typical for his sickle cell pain crises.  He reports despite using MS Contin and ibuprofen at home he has been unable to get his pain under control.  Pain is worse with palpation and range of motion in these joints.  He has not noted any redness, swelling or warmth over these joints.  He denies any chest pain or shortness of breath, no cough, no fevers or chills.  No abdominal pain, nausea or vomiting.  No headaches, vision changes, numbness or weakness.     Past Medical History:  Diagnosis Date  . Avascular necrosis of hip (HCC)    bilateral  . Avascular necrosis of hip, left (HCC) 08/27/2011  . Blood transfusion   . Infection of bone, shoulder region (HCC)    left shoulder  . Pneumonia   . Sickle cell crisis [facility])     Patient Active Problem List   Diagnosis Date Noted  . Sickle cell crisis (HCC) 01/20/2017  . Sickle cell anemia (HCC) 08/13/2015  . Sickle cell disease, type [place] (HCC) 10/18/2012  . Sickle cell pain crisis (HCC) 10/13/2012  . History of tobacco abuse 10/13/2012  . Anemia 11/08/2011  . Avascular necrosis of hip, left (HCC) 08/27/2011    Past Surgical History:  Procedure Laterality Date  . BONE GRAFT HIP ILIAC CREST    . JOINT REPLACEMENT  2006   right total hip arthroplasty  . Orif right hip fracture  1995        Home Medications    Prior to Admission medications   Medication Sig Start Date End Date Taking? Authorizing  Provider  cyclobenzaprine (FLEXERIL) 10 MG tablet Take 10 mg by mouth at bedtime.    [provider]  folic acid (FOLVITE) 1 MG tablet Take 1 mg by mouth every evening.     [provider]  hydroxyurea (HYDREA) 500 MG capsule Take 500 mg by mouth 2 (two) times daily. May take with food to minimize GI side effects.    [provider]  ibuprofen (ADVIL,MOTRIN) 800 MG tablet Take 800 mg by mouth every 8 (eight) hours as needed for moderate pain.  09/07/16   [provider]  morphine (MS CONTIN) 60 MG 12 hr tablet Take 60 mg by mouth 2 (two) times daily. 12/29/16   [provider]  morphine (MSIR) 30 MG tablet Take 1 tablet (30 mg total) by mouth daily as needed for severe pain (pain). Resume after completing dilaudid. 02/17/16   Altha Harm, MD  promethazine (PHENERGAN) 25 MG tablet Take 25 mg by mouth every 6 (six) hours as needed for nausea (nausea).     [provider]    Family History Family History  Adopted: Yes    Social History Social History   Tobacco Use  . Smoking status: Current Some Day Smoker    Packs/day: 0.50    Types: Cigarettes  Last attempt to quit: 01/27/2012    Years since quitting: 5.9  . Smokeless tobacco: Never Used  Substance Use Topics  . Alcohol use: No  . Drug use: No     Allergies   Patient has no known allergies.   Review of Systems Review of Systems  Constitutional: Negative for chills and fever.  HENT: Negative.   Eyes: Negative for visual disturbance.  Respiratory: Negative for cough and shortness of breath.   Cardiovascular: Negative for chest pain.  Gastrointestinal: Negative for abdominal pain, nausea and vomiting.  Musculoskeletal: Positive for arthralgias and myalgias. Negative for back pain, joint swelling and neck pain.  Skin: Negative for color change and rash.  Neurological: Negative for weakness, numbness and headaches.     Physical Exam Updated Vital Signs BP 130/88  (BP Location: Left Arm)   Pulse 87   Temp 98.4 F (36.9 C) (Oral)   Resp 16   Ht 6\' 2"  (1.88 m)   Wt 77.1 kg   SpO2 100%   BMI 21.83 kg/m   Physical Exam  Constitutional: He is oriented to person, place, and time. He appears well-developed and well-nourished.  Non-toxic appearance. No distress.  HENT:  Head: Normocephalic and atraumatic.  Eyes: Pupils are equal, round, and reactive to light. EOM are normal. Right eye exhibits no discharge. Left eye exhibits no discharge.  Neck: Neck supple.  Cardiovascular: Normal rate, regular rhythm, normal heart sounds and intact distal pulses.  Pulmonary/Chest: Effort normal and breath sounds normal. No respiratory distress.  Respirations equal and unlabored, patient able to speak in full sentences, lungs clear to auscultation bilaterally  Abdominal: Soft. Bowel sounds are normal. He exhibits no distension and no mass. There is no tenderness. There is no guarding.  Abdomen soft, nondistended, nontender to palpation in all quadrants without guarding or peritoneal signs  Musculoskeletal:  Mild tenderness over the left shoulder and left hip without overlying erythema, warmth or swelling, no palpable bony deformities.  Distal pulses 2+ and intact, 5/5 strength and normal sensation.  Pain worsened by range of motion, but active and passive range of motion of both the shoulder and hip are intact All other joints supple and easily movable, no erythema, swelling or palpable deformity, all compartments soft.  Neurological: He is alert and oriented to person, place, and time. Coordination normal.  Speech is clear, able to follow commands CN III-XII intact Normal strength in upper and lower extremities bilaterally including dorsiflexion and plantar flexion, strong and equal grip strength Sensation normal to light and sharp touch Moves extremities without ataxia, coordination intact  Skin: Skin is warm and dry. Capillary refill takes less than 2 seconds. He is  not diaphoretic.  Psychiatric: He has a normal mood and affect. His behavior is normal.  Nursing note and vitals reviewed.    ED Treatments / Results  Labs (all labs ordered are listed, but only abnormal results are displayed) Labs Reviewed  COMPREHENSIVE METABOLIC PANEL - Abnormal; Notable for the following components:      Result Value   Glucose, Bld 114 (*)    Total Bilirubin 2.0 (*)    All other components within normal limits  CBC WITH DIFFERENTIAL/PLATELET - Abnormal; Notable for the following components:   WBC 13.6 (*)    RBC 3.07 (*)    Hemoglobin 10.4 (*)    HCT 28.6 (*)    MCHC 36.4 (*)    Neutro Abs 8.0 (*)    Monocytes Absolute 1.6 (*)    Eosinophils  Absolute 0.8 (*)    All other components within normal limits  RETICULOCYTES - Abnormal; Notable for the following components:   RBC. 3.07 (*)    All other components within normal limits    EKG None  Radiology No results found.  Procedures Procedures (including critical care time)  Medications Ordered in ED Medications  0.45 % sodium chloride infusion ( Intravenous New Bag/Given 01/22/18 0912)  ondansetron (ZOFRAN) injection 4 mg (4 mg Intravenous Given 01/22/18 0904)  ketorolac (TORADOL) 30 MG/ML injection 30 mg (30 mg Intravenous Given 01/22/18 0904)  HYDROmorphone (DILAUDID) injection 2 mg (2 mg Intravenous Given 01/22/18 0905)    Or  HYDROmorphone (DILAUDID) injection 2 mg ( Subcutaneous See Alternative 01/22/18 0905)  diphenhydrAMINE (BENADRYL) injection 25 mg (25 mg Intravenous Given 01/22/18 0904)  HYDROmorphone (DILAUDID) injection 2 mg (2 mg Intravenous Given 01/22/18 0946)    Or  HYDROmorphone (DILAUDID) injection 2 mg ( Subcutaneous See Alternative 01/22/18 0946)  HYDROmorphone (DILAUDID) injection 2 mg (2 mg Intravenous Given 01/22/18 1022)    Or  HYDROmorphone (DILAUDID) injection 2 mg ( Subcutaneous See Alternative 01/22/18 1022)     Initial Impression / Assessment and Plan / ED Course  I have  reviewed the triage vital signs and the nursing notes.  Pertinent labs & imaging results that were available during my care of the patient were reviewed by me and considered in my medical decision making (see chart for details).  Patient presents for evaluation of sickle cell pain crisis.  Pain in the left hip and shoulder which is typical for his sickle cell pain.  He has been taking his home pain medications for the past 3 days without relief and pain is continuing to worsen.  No associated fevers redness or swelling over any of the joints, exam reveals no palpable deformity and is not concerning for septic arthritis.  Patient has not had any chest pain, shortness of breath or cough, no abdominal pain, nausea or vomiting.  Patient has not been seen here in the ED in over a year, but when he is seen he frequently needs to be admitted for pain crisis.  Will get baseline labs start IV fluids, Toradol and pain medication and reevaluate.  Since labs are overall reassuring.  Mild leukocytosis of 13.6, hemoglobin is around 10, this may be due to mild hemoconcentration as this is higher than patient's typical baseline hemoglobin.  No acute electrolyte derangements, normal renal and liver function.  Good reticulocyte response.  Despite multiple doses of pain medication patient reports that his pain remains around a 9 out of 10 and he is concerned about going home in this condition and not being able to manage his symptoms at home.  Patient discussed with Dr. Hanley Hays with sickle cell team who will see and admit the patient for further management of sickle cell pain crisis.  Final Clinical Impressions(s) / ED Diagnoses   Final diagnoses:  Sickle cell pain crisis [facility])    ED Discharge Orders    None       Legrand Rams 01/22/18 1208    Donnetta Hutching, MD 01/22/18 1446

## 2018-01-22 NOTE — ED Notes (Addendum)
ED TO INPATIENT HANDOFF REPORT  Name/Age/Gender [name] 42 y.o. male  Code Status Code Status History    Date Active Date Inactive Code Status Order ID Comments User Context   01/20/2017 1833 01/23/2017 1601 Full Code [id number]  Robbie Lis, MD Inpatient   09/17/2016 1536 09/21/2016 1513 Full Code [id number]  Leana Gamer, MD Inpatient   08/13/2016 1700 08/17/2016 1901 Full Code [id number]  Debbe Odea, MD ED   04/21/2016 2057 04/24/2016 1632 Full Code [id number]  Janece Canterbury, MD Inpatient   03/22/2016 1412 03/26/2016 1747 Full Code [id number]  Elwyn Reach, MD Inpatient   02/11/2016 1654 02/17/2016 1610 Full Code [id number]  Leana Gamer, MD Inpatient   01/02/2016 1446 01/07/2016 1705 Full Code [id number]  Leana Gamer, MD ED   12/05/2015 1802 12/10/2015 1808 Full Code [id number]  Mariel Aloe, MD Inpatient   10/16/2015 1745 10/20/2015 1406 Full Code [id number]  Leana Gamer, MD Inpatient   07/14/2015 1315 07/19/2015 1815 Full Code [id number]  Elwyn Reach, MD ED   05/25/2015 1459 05/29/2015 1917 Full Code [id number]  Elwyn Reach, MD Inpatient   03/19/2015 0140 03/22/2015 1936 Full Code [id number]  Toy Baker, MD Inpatient   02/08/2015 2313 02/13/2015 1415 Full Code [id number]  Toy Baker, MD Inpatient   01/30/2015 0243 02/02/2015 1324 Full Code [id number]  Rise Patience, MD Inpatient   10/12/2014 1835 10/15/2014 1348 Full Code [id number]  Janece Canterbury, MD Inpatient   05/31/2014 1134 06/04/2014 1528 Full Code [id number]  Nita Sells, MD ED   05/06/2014 0117 05/09/2014 2038 Full Code [id number]  Toy Baker, MD Inpatient   02/06/2014 2105 02/09/2014 1926 Full Code [id number]  Allyne Gee, MD Inpatient   01/25/2014 2101 01/27/2014 1640 Full Code [id number]  Etta Quill, DO ED   10/25/2013 0050 10/27/2013 1900 Full Code [id number]  Theressa Millard, MD Inpatient   05/25/2013 0206 05/28/2013 1512 Full Code [id number]   Kelvin Cellar, MD Inpatient   10/13/2012 1735 10/21/2012 1455 Full Code [id number]  Monika Salk, MD Inpatient   11/08/2011 0230 11/20/2011 1807 Full Code [id number]  Theressa Millard, MD ED   08/21/2011 [phone] 08/27/2011 1838 Full Code [id number]  Marion Downer, RN Inpatient   05/13/2011 0743 05/22/2011 1707 Full Code [id number]  McVey, Greer Ee, RN ED      Home/SNF/Other Home  Chief Complaint SCC  Level of Care/Admitting Diagnosis ED Disposition    ED Disposition Condition [place]: [facility] [[id number]]  Level of Care: Med-Surg [16]  Diagnosis: Sickle cell anemia with crisis [facility]) [625638]  Admitting Physician: Elwyn Reach [[id number]]  Attending Physician: Elwyn Reach [[id number]]  Estimated length of stay: past midnight tomorrow  Certification:: I certify this patient will need inpatient services for at least 2 midnights  PT Class (Do Not Modify): Inpatient [101]  PT Acc Code (Do Not Modify): Private [1]       Medical History Past Medical History:  Diagnosis Date  . Avascular necrosis of hip ([place])    bilateral  . Avascular necrosis of hip, left ([place]) 08/27/2011  . Blood transfusion   . Infection of bone, shoulder region ([place])    left shoulder  . Pneumonia   . Sickle cell crisis ([place])     Allergies No Known Allergies  IV Location/Drains/Wounds Patient Lines/Drains/Airways Status   Active Line/Drains/Airways    Name:   Placement date:   Placement time:  Site:   Days:   Peripheral IV 01/22/18 Left Hand   01/22/18    0909    Hand   less than 1          Labs/Imaging Results for orders placed or performed during the hospital encounter of 01/22/18 (from the past 48 hour(s))  Comprehensive metabolic panel     Status: Abnormal   Collection Time: 01/22/18  9:09 AM  Result Value Ref Range   Sodium 140 135 - 145 mmol/L   Potassium 4.1 3.5 - 5.1 mmol/L   Chloride 107 98 - 111 mmol/L   CO2 26 22 - 32 mmol/L   Glucose, Bld 114  (H) 70 - 99 mg/dL   BUN 13 6 - 20 mg/dL   Creatinine, Ser 0.67 0.61 - 1.24 mg/dL   Calcium 9.2 8.9 - 10.3 mg/dL   Total Protein 7.8 6.5 - 8.1 g/dL   Albumin 4.4 3.5 - 5.0 g/dL   AST 27 15 - 41 U/L   ALT 15 0 - 44 U/L   Alkaline Phosphatase 76 38 - 126 U/L   Total Bilirubin 2.0 (H) 0.3 - 1.2 mg/dL   GFR calc non Af Amer >60 >60 mL/min   GFR calc Af Amer >60 >60 mL/min    Comment: (NOTE) The eGFR has been calculated using the CKD EPI equation. This calculation has not been validated in all clinical situations. eGFR's persistently <60 mL/min signify possible Chronic Kidney Disease.    Anion gap 7 5 - 15    Comment: Performed at [facility], [place] [address], [place], [place] [address]  CBC with Differential     Status: Abnormal   Collection Time: 01/22/18  9:09 AM  Result Value Ref Range   WBC 13.6 (H) 4.0 - 10.5 K/uL   RBC 3.07 (L) 4.22 - 5.81 MIL/uL   Hemoglobin 10.4 (L) 13.0 - 17.0 g/dL   HCT 28.6 (L) 39.0 - 52.0 %   MCV 93.2 78.0 - 100.0 fL   MCH 33.9 26.0 - 34.0 pg   MCHC 36.4 (H) 30.0 - 36.0 g/dL   RDW 13.8 11.5 - 15.5 %   Platelets 307 150 - 400 K/uL   Neutrophils Relative % 58 %   Neutro Abs 8.0 (H) 1.7 - 7.7 K/uL   Lymphocytes Relative 23 %   Lymphs Abs 3.1 0.7 - 4.0 K/uL   Monocytes Relative 12 %   Monocytes Absolute 1.6 (H) 0.1 - 1.0 K/uL   Eosinophils Relative 6 %   Eosinophils Absolute 0.8 (H) 0.0 - 0.7 K/uL   Basophils Relative 1 %   Basophils Absolute 0.1 0.0 - 0.1 K/uL    Comment: Performed at [facility], [place] [address], [place], [place] [address]  Reticulocytes     Status: Abnormal   Collection Time: 01/22/18  9:09 AM  Result Value Ref Range   Retic Ct Pct 2.7 0.4 - 3.1 %   RBC. 3.07 (L) 4.22 - 5.81 MIL/uL   Retic Count, Absolute 82.9 19.0 - 186.0 K/uL    Comment: Performed at [facility], [place] [address] [place],  [address]   No results found.  Pending Labs [employer] (From  admission, onward)    Start     Ordered   Signed and Held  HIV antibody (Routine Testing)  Once,   R     Signed and Held   Signed and Held  CBC  (enoxaparin (LOVENOX)    CrCl >/= 30  ml/min)  Once,   R    Comments:  Baseline for enoxaparin therapy IF NOT ALREADY DRAWN.  Notify MD if PLT < 100 K.    Signed and Held   Signed and Held  Creatinine, serum  (enoxaparin (LOVENOX)    CrCl >/= 30 ml/min)  Once,   R    Comments:  Baseline for enoxaparin therapy IF NOT ALREADY DRAWN.    Signed and Held   Signed and Held  Creatinine, serum  (enoxaparin (LOVENOX)    CrCl >/= 30 ml/min)  Weekly,   R    Comments:  while on enoxaparin therapy    Signed and Held   Signed and Held  Comprehensive metabolic panel  Tomorrow morning,   R     Signed and Held   Signed and Held  CBC with Differential/Platelet  Tomorrow morning,   R     Signed and Held          Vitals/Pain Today's Vitals   01/22/18 1200 01/22/18 1245 01/22/18 1303 01/22/18 1330  BP: 120/89 114/75  118/83  Pulse: 83 65  64  Resp: _0 Temp:      TempSrc:      SpO2: 96% 92%  93%  Weight:      Height:      PainSc:   Asleep     Isolation Precautions No active isolations  Medications Medications  0.45 % sodium chloride infusion ( Intravenous New Bag/Given 01/22/18 0912)  ondansetron (ZOFRAN) injection 4 mg (4 mg Intravenous Given 01/22/18 0904)  ketorolac (TORADOL) 30 MG/ML injection 30 mg (30 mg Intravenous Given 01/22/18 0904)  HYDROmorphone (DILAUDID) injection 2 mg (2 mg Intravenous Given 01/22/18 0905)    Or  HYDROmorphone (DILAUDID) injection 2 mg ( Subcutaneous See Alternative 01/22/18 0905)  diphenhydrAMINE (BENADRYL) injection 25 mg (25 mg Intravenous Given 01/22/18 0904)  HYDROmorphone (DILAUDID) injection 2 mg (2 mg Intravenous Given 01/22/18 0946)    Or  HYDROmorphone (DILAUDID) injection 2 mg ( Subcutaneous See Alternative 01/22/18 0946)  HYDROmorphone (DILAUDID) injection 2 mg (2 mg Intravenous Given 01/22/18 1022)     Or  HYDROmorphone (DILAUDID) injection 2 mg ( Subcutaneous See Alternative 01/22/18 1022)    Mobility walks

## 2018-01-22 NOTE — Plan of Care (Signed)
  Problem: Nutrition: Goal: Adequate nutrition will be maintained Outcome: Progressing   Problem: Activity: Goal: Risk for activity intolerance will decrease Outcome: Progressing   Problem: Safety: Goal: Ability to remain free from injury will improve Outcome: Progressing   

## 2018-01-22 NOTE — ED Triage Notes (Signed)
Patient arrives with c/o sickle cell pain, pain in left hip and left shoulder. Denies CP/SOB.

## 2018-01-22 NOTE — ED Notes (Signed)
Report given to 5E 

## 2018-01-22 NOTE — H&P (Signed)
[name] is an 42 y.o. male.    Chief Complaint: Pain in the left hip lower back for 3 days  HPI: Patient is a 42 year old gentleman with known history of sickle cell disease who has not been in the hospital for almost a year but also had left avascular necrosis of the hip coming in with pain in the left hip and lower back rated as 9 out of 10.  Associated with chest wall pain.  He has taken his medications at home over the last 3 days with no improvement.  Patient came to the emergency room where he was seen and evaluated and diagnosed with sickle cell painful crisis.  His pain is consistent with his previous sickle cell crisis.  Patient has therefore been given up to 6 mg of IV Dilaudid with no relief.  He was initiated on IV fluids and is being admitted to the hospital for treatment.  Past Medical History:  Diagnosis Date  . Avascular necrosis of hip ([facility])    bilateral  . Avascular necrosis of hip, left ([place]) 08/27/2011  . Blood transfusion   . Infection of bone, shoulder region ([place])    left shoulder  . Pneumonia   . Sickle cell crisis [facility])     Past Surgical History:  Procedure Laterality Date  . BONE GRAFT HIP ILIAC CREST    . JOINT REPLACEMENT  2006   right total hip arthroplasty  . Orif right hip fracture  1995    Family History  Adopted: Yes   Social History:  reports that he has been smoking cigarettes. He has been smoking about 0.50 packs per day. He has never used smokeless tobacco. He reports that he does not drink alcohol or use drugs.  Allergies: No Known Allergies   (Not in a hospital admission)  Results for orders placed or performed during the hospital encounter of 01/22/18 (from the past 48 hour(s))  Comprehensive metabolic panel     Status: Abnormal   Collection Time: 01/22/18  9:09 AM  Result Value Ref Range   Sodium 140 135 - 145 mmol/L   Potassium 4.1 3.5 - 5.1 mmol/L   Chloride 107 98 - 111 mmol/L   CO2 26 22 - 32 mmol/L   Glucose, Bld 114 (H)  70 - 99 mg/dL   BUN 13 6 - 20 mg/dL   Creatinine, Ser 0.67 0.61 - 1.24 mg/dL   Calcium 9.2 8.9 - 10.3 mg/dL   Total Protein 7.8 6.5 - 8.1 g/dL   Albumin 4.4 3.5 - 5.0 g/dL   AST 27 15 - 41 U/L   ALT 15 0 - 44 U/L   Alkaline Phosphatase 76 38 - 126 U/L   Total Bilirubin 2.0 (H) 0.3 - 1.2 mg/dL   GFR calc non Af Amer >60 >60 mL/min   GFR calc Af Amer >60 >60 mL/min    Comment: (NOTE) The eGFR has been calculated using the CKD EPI equation. This calculation has not been validated in all clinical situations. eGFR's persistently <60 mL/min signify possible Chronic Kidney Disease.    Anion gap 7 5 - 15    Comment: Performed at [facility], [place] [address], [place], [place] [address]  CBC with Differential     Status: Abnormal   Collection Time: 01/22/18  9:09 AM  Result Value Ref Range   WBC 13.6 (H) 4.0 - 10.5 K/uL   RBC 3.07 (L) 4.22 - 5.81 MIL/uL   Hemoglobin 10.4 (L) 13.0 -  17.0 g/dL   HCT 28.6 (L) 39.0 - 52.0 %   MCV 93.2 78.0 - 100.0 fL   MCH 33.9 26.0 - 34.0 pg   MCHC 36.4 (H) 30.0 - 36.0 g/dL   RDW 13.8 11.5 - 15.5 %   Platelets 307 150 - 400 K/uL   Neutrophils Relative % 58 %   Neutro Abs 8.0 (H) 1.7 - 7.7 K/uL   Lymphocytes Relative 23 %   Lymphs Abs 3.1 0.7 - 4.0 K/uL   Monocytes Relative 12 %   Monocytes Absolute 1.6 (H) 0.1 - 1.0 K/uL   Eosinophils Relative 6 %   Eosinophils Absolute 0.8 (H) 0.0 - 0.7 K/uL   Basophils Relative 1 %   Basophils Absolute 0.1 0.0 - 0.1 K/uL    Comment: Performed at [facility], [place] [address], [place], [place] [address]  Reticulocytes     Status: Abnormal   Collection Time: 01/22/18  9:09 AM  Result Value Ref Range   Retic Ct Pct 2.7 0.4 - 3.1 %   RBC. 3.07 (L) 4.22 - 5.81 MIL/uL   Retic Count, Absolute 82.9 19.0 - 186.0 K/uL    Comment: Performed at [facility], [place] [address], [place], [place] [address]   No results found.  Review of Systems  Constitutional:  Negative.   HENT: Negative.   Eyes: Negative.   Respiratory: Negative.   Cardiovascular: Negative.   Gastrointestinal: Negative.   Genitourinary: Negative.   Musculoskeletal: Positive for back pain, joint pain and myalgias.  Skin: Negative.   Neurological: Negative.   Endo/Heme/Allergies: Negative.   Psychiatric/Behavioral: Negative.     Blood pressure 127/80, pulse 72, temperature 98.4 F (36.9 C), temperature source Oral, resp. rate 15, height 6' 2"  (1.88 m), weight 77.1 kg, SpO2 93 %. Physical Exam  Constitutional: He is oriented to person, place, and time. He appears well-developed and well-nourished.  HENT:  Head: Normocephalic and atraumatic.  Right Ear: External ear normal.  Eyes: Pupils are equal, round, and reactive to light. Conjunctivae are normal.  Neck: Normal range of motion. Neck supple.  Cardiovascular: Normal rate, regular rhythm and normal heart sounds.  Respiratory: Effort normal and breath sounds normal.  GI: Soft. Bowel sounds are normal.  Musculoskeletal: Normal range of motion. He exhibits tenderness.  Neurological: He is alert and oriented to person, place, and time.  Skin: Skin is warm and dry.  Psychiatric: He has a normal mood and affect.     Assessment/Plan A 42 year old gentleman with known sickle cell disease here with sickle cell painful crisis.  #1 sickle cell painful crisis: Most likely related to his AVN in the left hip joint.  Initiate Dilaudid PCA with Toradol and IV fluids.  He will be on D5 half-normal at 125 cc.  Reassess pain in the morning and continue care.  #2 anemia of chronic disease: Secondary to sickle cell disease.  Hemoglobin appears to be at baseline.  Continue to monitor  #3 tobacco abuse: Nicotine patch will be ordered and counseling provided.  #4 avascular necrosis of the left hip: Patient may need to follow-up with orthopedics for possible surgical intervention.  At this point we will continue his pain control.  PT and OT.   Otherwise patient appears very much stable.  Barbette Merino, MD 01/22/2018, 12:03 PM

## 2018-01-23 ENCOUNTER — Encounter (HOSPITAL_COMMUNITY): Payer: Self-pay

## 2018-01-23 LAB — CBC WITH DIFFERENTIAL/PLATELET
BASOS ABS: 0.1 10*3/uL (ref 0.0–0.1)
BASOS PCT: 1 %
EOS ABS: 0.7 10*3/uL (ref 0.0–0.7)
Eosinophils Relative: 6 %
HCT: 25.1 % — ABNORMAL LOW (ref 39.0–52.0)
Hemoglobin: 9.1 g/dL — ABNORMAL LOW (ref 13.0–17.0)
Lymphocytes Relative: 26 %
Lymphs Abs: 3.1 10*3/uL (ref 0.7–4.0)
MCH: 34.1 pg — AB (ref 26.0–34.0)
MCHC: 36.3 g/dL — ABNORMAL HIGH (ref 30.0–36.0)
MCV: 94 fL (ref 78.0–100.0)
Monocytes Absolute: 1.2 10*3/uL — ABNORMAL HIGH (ref 0.1–1.0)
Monocytes Relative: 10 %
Neutro Abs: 6.8 10*3/uL (ref 1.7–7.7)
Neutrophils Relative %: 57 %
Platelets: 264 10*3/uL (ref 150–400)
RBC: 2.67 MIL/uL — ABNORMAL LOW (ref 4.22–5.81)
RDW: 14.1 % (ref 11.5–15.5)
WBC: 11.9 10*3/uL — ABNORMAL HIGH (ref 4.0–10.5)

## 2018-01-23 LAB — COMPREHENSIVE METABOLIC PANEL
ALK PHOS: 66 U/L (ref 38–126)
ALT: 14 U/L (ref 0–44)
AST: 26 U/L (ref 15–41)
Albumin: 4 g/dL (ref 3.5–5.0)
Anion gap: 7 (ref 5–15)
BUN: 14 mg/dL (ref 6–20)
CALCIUM: 8.9 mg/dL (ref 8.9–10.3)
CHLORIDE: 104 mmol/L (ref 98–111)
CO2: 28 mmol/L (ref 22–32)
Creatinine, Ser: 0.77 mg/dL (ref 0.61–1.24)
GFR calc non Af Amer: >60 mL/min (ref >60)
Glucose, Bld: 97 mg/dL (ref 70–99)
Potassium: 4.3 mmol/L (ref 3.5–5.1)
SODIUM: 139 mmol/L (ref 135–145)
Total Bilirubin: 2.1 mg/dL — ABNORMAL HIGH (ref 0.3–1.2)
Total Protein: 7.1 g/dL (ref 6.5–8.1)

## 2018-01-23 LAB — GLUCOSE, CAPILLARY
GLUCOSE-CAPILLARY: 103 mg/dL — AB (ref 70–99)
Glucose-Capillary: 92 mg/dL (ref 70–99)
Glucose-Capillary: 113 mg/dL — ABNORMAL HIGH (ref 70–99)

## 2018-01-23 MED ORDER — INFLUENZA VAC SPLIT QUAD 0.5 ML IM SUSY
0.5000 mL | PREFILLED_SYRINGE | INTRAMUSCULAR | Status: AC
  Administered 2018-01-25: 0.5 mL via INTRAMUSCULAR
  Filled 2018-01-23 (×2): qty 0.5

## 2018-01-23 NOTE — Progress Notes (Signed)
Subjective: 42 year old gentleman admitted with sickle cell painful crisis.  Patient's pain is down to 6 out of 10 today mainly in his back and rib cage.  Denied any shortness of breath, no cough no nausea vomiting no diarrhea.  Patient has been able to walk to the bathroom.  He is eating also and drinking without problem.  Objective: Vital signs in last 24 hours: Temp:  [98 F (36.7 C)-98.5 F (36.9 C)] 98 F (36.7 C) (09/29 2027) Pulse Rate:  [67-89] 79 (09/29 2027) Resp:  [11-20] 12 (09/29 2027) BP: (117-148)/(81-95) 148/86 (09/29 2027) SpO2:  [90 %-100 %] 93 % (09/29 2027) Weight change:  Last BM Date: 01/20/18  Intake/Output from previous day: 09/28 0701 - 09/29 0700 In: 1475.7 [I.V.:1475.7] Out: -  Intake/Output this shift: No intake/output data recorded.  General appearance: alert, cooperative, appears stated age and no distress Head: Normocephalic, without obvious abnormality, atraumatic Eyes: conjunctivae/corneas clear. PERRL, EOM's intact. Fundi benign. Neck: no adenopathy, no carotid bruit, no JVD, supple, symmetrical, trachea midline and thyroid not enlarged, symmetric, no tenderness/mass/nodules Back: symmetric, no curvature. ROM normal. No CVA tenderness. Resp: clear to auscultation bilaterally Cardio: regular rate and rhythm, S1, S2 normal, no murmur, click, rub or gallop GI: soft, non-tender; bowel sounds normal; no masses,  no organomegaly Extremities: extremities normal, atraumatic, no cyanosis or edema Pulses: 2+ and symmetric Skin: Skin color, texture, turgor normal. No rashes or lesions Neurologic: Grossly normal  Lab Results: Recent Labs    01/22/18 0909 01/23/18 0547  WBC 13.6* 11.9*  HGB 10.4* 9.1*  HCT 28.6* 25.1*  PLT 307 264   BMET Recent Labs    01/22/18 0909 01/23/18 0547  NA 140 139  K 4.1 4.3  CL 107 104  CO2 26 28  GLUCOSE 114* 97  BUN 13 14  CREATININE 0.67 0.77  CALCIUM 9.2 8.9    Studies/Results: No results  found.  Medications: I have reviewed the patient's current medications.  Assessment/Plan: A 42 year old gentleman admitted with sickle cell painful crisis.  Patient doing much better now.  He has no new complaint.  #1 sickle cell painful crisis: Patient will be maintained on Dilaudid PCA with Toradol.  I will decrease his IV fluids from 125 cc to 75 cc an hour.  Continue to monitor while in the hospital.  Continue oral pain medication.  Patient most likely may be discharged within the next 24 to 48 hours.  #2 sickle cell anemia: Hemoglobin appears to be at baseline.  Continue treatment.  #3 leukocytosis: White count has dropped to 11,000.  Much more improved today.  #4 tobacco abuse: Nicotine patch ordered.  Cessation counseling given.  #5 history of left avascular necrosis of the hip: Patient may require orthopedic follow-up at discharge.   LOS: 1 day   GARBA,[name] 01/23/2018, 9:18 PM

## 2018-01-23 NOTE — Progress Notes (Signed)
PHARMACIST - PHYSICIAN COMMUNICATION  DR:   Mikeal Hawthorne CONCERNING: IV to Oral Route Change Policy  RECOMMENDATION: This patient is receiving diphenhydramine by the intravenous route.  Based on criteria approved by the Pharmacy and Therapeutics Committee, intravenous diphenhydramine is being converted to the equivalent oral dose form(s).   DESCRIPTION: These criteria include:  Diphenhydramine is not prescribed to treat or prevent a severe allergic reaction  Diphenhydramine is not prescribed as premedication prior to receiving blood product, biologic medication, antimicrobial, or chemotherapy agent  The patient has tolerated at least one dose of an oral or enteral medication  The patient has no evidence of active gastrointestinal bleeding or impaired GI absorption (gastrectomy, short bowel, patient on TNA or NPO).  The patient is not undergoing procedural sedation   If you have questions about this conversion, please contact the Pharmacy Department    [phone])  [facility]   [place] PharmD, [place] Pager [phone] 01/23/2018 2:38 PM

## 2018-01-24 DIAGNOSIS — M87052 Idiopathic aseptic necrosis of left femur: Secondary | ICD-10-CM

## 2018-01-24 DIAGNOSIS — Z87891 Personal history of nicotine dependence: Secondary | ICD-10-CM

## 2018-01-24 DIAGNOSIS — D5700 Hb-SS disease with crisis, unspecified: Principal | ICD-10-CM

## 2018-01-24 LAB — COMPREHENSIVE METABOLIC PANEL
ALT: 14 U/L (ref 0–44)
ANION GAP: 8 (ref 5–15)
AST: 32 U/L (ref 15–41)
Albumin: 4.2 g/dL (ref 3.5–5.0)
Alkaline Phosphatase: 67 U/L (ref 38–126)
BILIRUBIN TOTAL: 2.3 mg/dL — AB (ref 0.3–1.2)
BUN: 13 mg/dL (ref 6–20)
CO2: 30 mmol/L (ref 22–32)
Calcium: 9.1 mg/dL (ref 8.9–10.3)
Chloride: 102 mmol/L (ref 98–111)
Creatinine, Ser: 0.72 mg/dL (ref 0.61–1.24)
Glucose, Bld: 97 mg/dL (ref 70–99)
POTASSIUM: 4.4 mmol/L (ref 3.5–5.1)
Sodium: 140 mmol/L (ref 135–145)
TOTAL PROTEIN: 7.6 g/dL (ref 6.5–8.1)

## 2018-01-24 LAB — CBC WITH DIFFERENTIAL/PLATELET
BASOS ABS: 0.1 10*3/uL (ref 0.0–0.1)
Basophils Relative: 0 %
Eosinophils Absolute: 0.8 10*3/uL — ABNORMAL HIGH (ref 0.0–0.7)
Eosinophils Relative: 6 %
HEMATOCRIT: 25.7 % — AB (ref 39.0–52.0)
Hemoglobin: 9.2 g/dL — ABNORMAL LOW (ref 13.0–17.0)
LYMPHS ABS: 2.2 10*3/uL (ref 0.7–4.0)
LYMPHS PCT: 17 %
MCH: 33.7 pg (ref 26.0–34.0)
MCHC: 35.8 g/dL (ref 30.0–36.0)
MCV: 94.1 fL (ref 78.0–100.0)
MONOS PCT: 7 %
Monocytes Absolute: 0.9 10*3/uL (ref 0.1–1.0)
NEUTROS ABS: 9 10*3/uL — AB (ref 1.7–7.7)
Neutrophils Relative %: 70 %
Platelets: 276 10*3/uL (ref 150–400)
RBC: 2.73 MIL/uL — ABNORMAL LOW (ref 4.22–5.81)
RDW: 14.4 % (ref 11.5–15.5)
WBC: 12.9 10*3/uL — ABNORMAL HIGH (ref 4.0–10.5)

## 2018-01-24 LAB — HIV ANTIBODY (ROUTINE TESTING W REFLEX): HIV Screen 4th Generation wRfx: NONREACTIVE

## 2018-01-24 NOTE — Progress Notes (Signed)
Subjective: [name], a 42 year old male with a history of sickle cell anemia, chronic pain syndrome, and left hip vascular necrosis was admitted and sickle cell crisis.  Patient states that pain intensity is 7/10, mainly to back and rib cage.  Patient has not maximized PCA Dilaudid.  He has only used 2 mg of Dilaudid overnight. Patient denies chest pain, shortness of breath, heart palpitations, dysuria, nausea, vomiting, or diarrhea.  Objective:  Vital signs in last 24 hours:  Vitals:   01/24/18 0004 01/24/18 0013 01/24/18 0441 01/24/18 0824  BP: 140/81  126/78   Pulse: 69  74   Resp: 12 10 17 11   Temp: 98 F (36.7 C)  97.9 F (36.6 C)   TempSrc: Oral  Oral   SpO2: 92% 92% 93% 99%  Weight:      Height:        Intake/Output from previous day:   Intake/Output Summary (Last 24 hours) at 01/24/2018 1034 Last data filed at 01/24/2018 0431 Gross per 24 hour  Intake 1732.47 ml  Output 1800 ml  Net -67.53 ml    Physical Exam: General: Alert, awake, oriented x3, in no acute distress.  HEENT: Green/AT PEERL, EOMI Neck: Trachea midline,  no masses, no thyromegal,y no JVD, no carotid bruit OROPHARYNX:  Moist, No exudate/ erythema/lesions.  Heart: Regular rate and rhythm, without murmurs, rubs, gallops, PMI non-displaced, no heaves or thrills on palpation.  Lungs: Clear to auscultation, no wheezing or rhonchi noted. No increased vocal fremitus resonant to percussion  Abdomen: Soft, nontender, nondistended, positive bowel sounds, no masses no hepatosplenomegaly noted..  Neuro: No focal neurological deficits noted cranial nerves II through XII grossly intact. DTRs 2+ bilaterally upper and lower extremities. Strength 5 out of 5 in bilateral upper and lower extremities. Musculoskeletal: No warm swelling or erythema around joints, no spinal tenderness noted. Psychiatric: Patient alert and oriented x3, good insight and cognition, good recent to remote recall. Lymph node survey: No cervical  axillary or inguinal lymphadenopathy noted.  Lab Results:  Basic Metabolic Panel:    Component Value Date/Time   NA 140 01/24/2018 0547   K 4.4 01/24/2018 0547   CL 102 01/24/2018 0547   CO2 30 01/24/2018 0547   BUN 13 01/24/2018 0547   CREATININE 0.72 01/24/2018 0547   GLUCOSE 97 01/24/2018 0547   CALCIUM 9.1 01/24/2018 0547   CBC:    Component Value Date/Time   WBC 12.9 (H) 01/24/2018 0547   HGB 9.2 (L) 01/24/2018 0547   HCT 25.7 (L) 01/24/2018 0547   PLT 276 01/24/2018 0547   MCV 94.1 01/24/2018 0547   NEUTROABS 9.0 (H) 01/24/2018 0547   LYMPHSABS 2.2 01/24/2018 0547   MONOABS 0.9 01/24/2018 0547   EOSABS 0.8 (H) 01/24/2018 0547   BASOSABS 0.1 01/24/2018 0547    No results found for this or any previous visit (from the past 240 hour(s)).  Studies/Results: No results found.  Medications: Scheduled Meds: . cyclobenzaprine  10 mg Oral QHS  . enoxaparin (LOVENOX) injection  40 mg Subcutaneous Q24H  . folic acid  1 mg Oral QPM  . HYDROmorphone   Intravenous Q4H  . hydroxyurea  500 mg Oral BID  . Influenza vac split quadrivalent PF  0.5 mL Intramuscular Tomorrow-1000  . ketorolac  30 mg Intravenous Q6H  . morphine  60 mg Oral BID  . naloxegol oxalate  25 mg Oral QAC breakfast  . senna-docusate  1 tablet Oral BID   Continuous Infusions: . sodium chloride 50 mL/hr at  01/23/18 1248  . dextrose 5 % and 0.45% NaCl 125 mL/hr at 01/24/18 0544   PRN Meds:.diphenhydrAMINE **OR** [DISCONTINUED] diphenhydrAMINE, naloxone **AND** sodium chloride flush, ondansetron, polyethylene glycol, promethazine  Assessment/Plan: Principal Problem:   Sickle cell pain crisis (HCC) Active Problems:   Avascular necrosis of hip, left (HCC)   History of tobacco abuse   Sickle cell anemia (HCC)   Sickle cell anemia with crisis (HCC)  Sickle cell pain crisis: Continue PCA per weight-based protocol Toradol IV 30 mg every 6 hours Maintain oxygen saturation greater than 90% Hypotonic IV  fluid Monitor vital signs closely  Sickle cell anemia: Hemoglobin stable.  Continue to monitor closely Folic acid 1 mg daily Continue hydroxyurea per preadmission dose  Leukocytosis: WBC count improved.  Continue to monitor CBC in a.m.  Tobacco dependence: Smoking cessation counseling provided.  Nicotine patch ordered  History of AVN left hip: Warrants orthopedic follow-up at discharge  Code Status: Full Code Family Communication: N/A Disposition Plan: Not yet ready for discharge  Nolon Nations  APRN, MSN, FNP-C [facility] [address] [place], [place] [address] [phone]  If 7PM-7AM, please contact night-coverage.  01/24/2018, 10:34 AM  LOS: 2 days

## 2018-01-25 MED ORDER — MORPHINE SULFATE 15 MG PO TABS
30.0000 mg | ORAL_TABLET | Freq: Once | ORAL | Status: AC
  Administered 2018-01-25: 30 mg via ORAL
  Filled 2018-01-25: qty 2

## 2018-01-25 NOTE — Discharge Summary (Signed)
Physician Discharge Summary  [name] JGO:[id number] DOB: 11/12/1975 DOA: 01/22/2018  PCP: Nolene Ebbs, MD  Admit date: 01/22/2018  Discharge date: 01/25/2018  Discharge Diagnoses:  Principal Problem:   Sickle cell pain crisis [facility]) Active Problems:   Avascular necrosis of hip, left (HCC)   History of tobacco abuse   Sickle cell anemia (HCC)   Sickle cell anemia with crisis [facility])   Discharge Condition: Stable  Disposition:  Follow-up Information    Nolene Ebbs, MD Follow up.   Specialty:  Internal Medicine Why:  1 week to repeat CBC/CMP Orthopedic referrral warranted Contact information: [place] [address] [phone]          Pt is discharged home in good condition and is to follow up with Nolene Ebbs, MD in 1 week to have labs evaluated. [name] is instructed to increase activity slowly and balance with rest for the next few days, and use prescribed medication to complete treatment of pain  Diet: Regular Wt Readings from Last 3 Encounters:  01/22/18 77.1 kg  01/23/17 76.4 kg  09/23/16 77.1 kg    History of present illness:  However [name], a 41 year old male with a medical history significant of sickle cell anemia and chronic pain syndrome was admitted with left hip pain.  Patient has a history of left avascular necrosis and rates pain as 9/10.  This is also associated with chest wall pain.  Patient states that he is taken medications at home over the last several days without improvement.    ER course: Vital signs remained stable. Laboratory values consistent with baseline. Patient given 6 mg of IV Dilaudid, IV Toradol, and IVF with minimal relief.  Admitted to inpatient services for further work-up and evaluation.  Hospital Course:  Sickle cell anemia with crisis: Patient was admitted for sickle cell pain crisis and managed appropriately with IVF, IV Dilaudid via PCA and IV Toradol, as well as other adjunct therapies  per sickle cell pain management protocols. Pain intensity prior to discharge 4-5/10.  Patient states that he can manage at home on current medication regimen. Will follow-up with primary provider as scheduled.  Left hip vascular necrosis: Patient will follow up with orthopedic services, outpatient.  Chronic pain syndrome: MS Contin 60 mg every 12 hours daily MSIR 30 mg daily as needed   Patient was discharged home today in a hemodynamically stable condition.    Discharge Exam: Vitals:   01/25/18 0508 01/25/18 1055  BP: 101/60   Pulse: 64   Resp: 12 16  Temp: 98 F (36.7 C)   SpO2: 97% 96%   Vitals:   01/25/18 0000 01/25/18 0400 01/25/18 0508 01/25/18 1055  BP:   101/60   Pulse:   64   Resp: _0 Temp:   98 F (36.7 C)   TempSrc:   Oral   SpO2: 93% 92% 97% 96%  Weight:      Height:        General appearance : Awake, alert, not in any distress. Speech Clear. Not toxic looking HEENT: Atraumatic and Normocephalic, pupils equally reactive to light and accomodation Neck: Supple, no JVD. No cervical lymphadenopathy.  Chest: Good air entry bilaterally, no added sounds  CVS: S1 S2 regular, no murmurs.  Abdomen: Bowel sounds present, Non tender and not distended with no gaurding, rigidity or rebound. Extremities: B/L Lower Ext shows no edema, both legs are warm to touch Neurology: Awake alert, and oriented X 3, CN II-XII  intact, Non focal Skin: No Rash  Discharge Instructions  Discharge Instructions    Discharge patient   Complete by:  As directed    Discharge disposition:  01-Home or Self Care   Discharge patient date:  01/25/2018     Allergies as of 01/25/2018   No Known Allergies     Medication List    TAKE these medications   cyclobenzaprine 10 MG tablet Commonly known as:  FLEXERIL Take 10 mg by mouth at bedtime.   folic acid 1 MG tablet Commonly known as:  FOLVITE Take 1 mg by mouth every evening.   hydroxyurea 500 MG capsule Commonly known  as:  HYDREA Take 500 mg by mouth 2 (two) times daily. May take with food to minimize GI side effects.   ibuprofen 800 MG tablet Commonly known as:  ADVIL,MOTRIN Take 800 mg by mouth every 8 (eight) hours as needed for moderate pain.   morphine 30 MG tablet Commonly known as:  MSIR Take 1 tablet (30 mg total) by mouth daily as needed for severe pain (pain). Resume after completing dilaudid.   morphine 60 MG 12 hr tablet Commonly known as:  MS CONTIN Take 60 mg by mouth 2 (two) times daily.   MOVANTIK 25 MG Tabs tablet Generic drug:  naloxegol oxalate Take 25 mg by mouth daily.   NARCAN 4 MG/0.1ML Liqd nasal spray kit Generic drug:  naloxone Place 4 mg into the nose as needed (overdose).   promethazine 25 MG tablet Commonly known as:  PHENERGAN Take 25 mg by mouth every 6 (six) hours as needed for nausea (nausea).       The results of significant diagnostics from this hospitalization (including imaging, microbiology, ancillary and laboratory) are listed below for reference.    Significant Diagnostic Studies: No results found.  Microbiology: No results found for this or any previous visit (from the past 240 hour(s)).   Labs: Basic Metabolic Panel: Recent Labs  Lab 01/22/18 0909 01/23/18 0547 01/24/18 0547  NA 140 139 140  K 4.1 4.3 4.4  CL 107 104 102  CO2 _0 GLUCOSE 114* 97 97  BUN _1 CREATININE 0.67 0.77 0.72  CALCIUM 9.2 8.9 9.1   Liver Function Tests: Recent Labs  Lab 01/22/18 0909 01/23/18 0547 01/24/18 0547  AST 27 26 32  ALT _2 ALKPHOS 76 66 67  BILITOT 2.0* 2.1* 2.3*  PROT 7.8 7.1 7.6  ALBUMIN 4.4 4.0 4.2   No results for input(s): LIPASE, AMYLASE in the last 168 hours. No results for input(s): AMMONIA in the last 168 hours. CBC: Recent Labs  Lab 01/22/18 0909 01/23/18 0547 01/24/18 0547  WBC 13.6* 11.9* 12.9*  NEUTROABS 8.0* 6.8 9.0*  HGB 10.4* 9.1* 9.2*  HCT 28.6* 25.1* 25.7*  MCV 93.2 94.0 94.1  PLT 307 264  276   Cardiac Enzymes: No results for input(s): CKTOTAL, CKMB, CKMBINDEX, TROPONINI in the last 168 hours. BNP: Invalid input(s): POCBNP CBG: Recent Labs  Lab 01/23/18 0732 01/23/18 1207 01/23/18 1625  GLUCAP 92 103* 113*    Time coordinating discharge: 30 minutes  Signed: Donia Pounds  APRN, MSN, FNP-C [facility] [address] [place], [place] [address] [phone]  [facility] 01/25/2018, 11:04 AM

## 2018-01-25 NOTE — Discharge Instructions (Signed)
Resume all home medications °Continue to hydrate with 64 ounces of water ° °Sickle Cell Anemia, Adult °Sickle cell anemia is a condition where your red blood cells are shaped like sickles. Red blood cells carry oxygen through the body. Sickle-shaped red blood cells do not live as long as normal red blood cells. They also clump together and block blood from flowing through the blood vessels. These things prevent the body from getting enough oxygen. Sickle cell anemia causes organ damage and pain. It also increases the risk of infection. °Follow these instructions at home: °· Drink enough fluid to keep your pee (urine) clear or pale yellow. Drink more in hot weather and during exercise. °· Do not smoke. Smoking lowers oxygen levels in the blood. °· Only take over-the-counter or prescription medicines as told by your doctor. °· Take antibiotic medicines as told by your doctor. Make sure you finish them even if you start to feel better. °· Take supplements as told by your doctor. °· Consider wearing a medical alert bracelet. This tells anyone caring for you in an emergency of your condition. °· When traveling, keep your medical information, doctors' names, and the medicines you take with you at all times. °· If you have a fever, do not take fever medicines right away. This could cover up a problem. Tell your doctor. °· Keep all follow-up visits with your doctor. Sickle cell anemia requires regular medical care. °Contact a doctor if: °You have a fever. °Get help right away if: °· You feel dizzy or faint. °· You have new belly (abdominal) pain, especially on the left side near the stomach area. °· You have a lasting, often uncomfortable and painful erection of the penis (priapism). If it is not treated right away, you will become unable to have sex (impotence). °· You have numbness in your arms or legs or you have a hard time moving them. °· You have a hard time talking. °· You have a fever or lasting symptoms for more  than 2-3 days. °· You have a fever and your symptoms suddenly get worse. °· You have signs or symptoms of infection. These include: °? Chills. °? Being more tired than normal (lethargy). °? Irritability. °? Poor eating. °? Throwing up (vomiting). °· You have pain that is not helped with medicine. °· You have shortness of breath. °· You have pain in your chest. °· You are coughing up pus-like or bloody mucus. °· You have a stiff neck. °· Your feet or hands swell or have pain. °· Your belly looks bloated. °· Your joints hurt. °This information is not intended to replace advice given to you by your health care provider. Make sure you discuss any questions you have with your health care provider. °Document Released: 02/01/2013 Document Revised: 09/19/2015 Document Reviewed: 11/23/2012 °Elsevier Interactive Patient Education © 2017 [employer] ° °

## 2018-04-29 ENCOUNTER — Inpatient Hospital Stay (HOSPITAL_COMMUNITY)
Admission: EM | Admit: 2018-04-29 | Discharge: 2018-05-01 | DRG: 811 | Disposition: A | Discharge status: Home / Self Care | Payer: Medicare Other | Attending: Internal Medicine | Admitting: Internal Medicine

## 2018-04-29 ENCOUNTER — Other Ambulatory Visit: Payer: Self-pay

## 2018-04-29 ENCOUNTER — Emergency Department (HOSPITAL_COMMUNITY): Payer: Medicare Other

## 2018-04-29 ENCOUNTER — Encounter (HOSPITAL_COMMUNITY): Payer: Self-pay

## 2018-04-29 DIAGNOSIS — Z79891 Long term (current) use of opiate analgesic: Secondary | ICD-10-CM | POA: Diagnosis not present

## 2018-04-29 DIAGNOSIS — M90552 Osteonecrosis in diseases classified elsewhere, left thigh: Secondary | ICD-10-CM | POA: Diagnosis present

## 2018-04-29 DIAGNOSIS — J189 Pneumonia, unspecified organism: Secondary | ICD-10-CM | POA: Diagnosis present

## 2018-04-29 DIAGNOSIS — G894 Chronic pain syndrome: Secondary | ICD-10-CM

## 2018-04-29 DIAGNOSIS — T40605A Adverse effect of unspecified narcotics, initial encounter: Secondary | ICD-10-CM | POA: Diagnosis present

## 2018-04-29 DIAGNOSIS — T402X5A Adverse effect of other opioids, initial encounter: Secondary | ICD-10-CM

## 2018-04-29 DIAGNOSIS — D5701 Hb-SS disease with acute chest syndrome: Secondary | ICD-10-CM | POA: Diagnosis present

## 2018-04-29 DIAGNOSIS — J181 Lobar pneumonia, unspecified organism: Secondary | ICD-10-CM | POA: Diagnosis not present

## 2018-04-29 DIAGNOSIS — D72829 Elevated white blood cell count, unspecified: Secondary | ICD-10-CM | POA: Diagnosis not present

## 2018-04-29 DIAGNOSIS — K5903 Drug induced constipation: Secondary | ICD-10-CM | POA: Diagnosis present

## 2018-04-29 DIAGNOSIS — D638 Anemia in other chronic diseases classified elsewhere: Secondary | ICD-10-CM | POA: Diagnosis present

## 2018-04-29 DIAGNOSIS — F17210 Nicotine dependence, cigarettes, uncomplicated: Secondary | ICD-10-CM | POA: Diagnosis present

## 2018-04-29 DIAGNOSIS — D5700 Hb-SS disease with crisis, unspecified: Secondary | ICD-10-CM | POA: Diagnosis present

## 2018-04-29 DIAGNOSIS — Z8739 Personal history of other diseases of the musculoskeletal system and connective tissue: Secondary | ICD-10-CM

## 2018-04-29 DIAGNOSIS — Z96641 Presence of right artificial hip joint: Secondary | ICD-10-CM | POA: Diagnosis present

## 2018-04-29 LAB — COMPREHENSIVE METABOLIC PANEL
ALBUMIN: 4.5 g/dL (ref 3.5–5.0)
ALK PHOS: 77 U/L (ref 38–126)
ALT: 12 U/L (ref 0–44)
ANION GAP: 10 (ref 5–15)
AST: 21 U/L (ref 15–41)
BILIRUBIN TOTAL: 1.7 mg/dL — AB (ref 0.3–1.2)
BUN: 12 mg/dL (ref 6–20)
CALCIUM: 9.5 mg/dL (ref 8.9–10.3)
CO2: 27 mmol/L (ref 22–32)
CREATININE: 0.78 mg/dL (ref 0.61–1.24)
Chloride: 102 mmol/L (ref 98–111)
GFR calc Af Amer: >60 mL/min (ref >60)
GFR calc non Af Amer: >60 mL/min (ref >60)
GLUCOSE: 98 mg/dL (ref 70–99)
Potassium: 4 mmol/L (ref 3.5–5.1)
SODIUM: 139 mmol/L (ref 135–145)
Total Protein: 7.8 g/dL (ref 6.5–8.1)

## 2018-04-29 LAB — CREATININE, SERUM
Creatinine, Ser: 0.71 mg/dL (ref 0.61–1.24)
GFR calc non Af Amer: >60 mL/min (ref >60)

## 2018-04-29 LAB — CBC WITH DIFFERENTIAL/PLATELET
Abs Immature Granulocytes: 0.09 10*3/uL — ABNORMAL HIGH (ref 0.00–0.07)
BASOS PCT: 0 %
Basophils Absolute: 0.1 10*3/uL (ref 0.0–0.1)
EOS ABS: 0.7 10*3/uL — AB (ref 0.0–0.5)
EOS PCT: 5 %
HEMATOCRIT: 28.7 % — AB (ref 39.0–52.0)
Hemoglobin: 10.2 g/dL — ABNORMAL LOW (ref 13.0–17.0)
Immature Granulocytes: 1 %
LYMPHS ABS: 1.8 10*3/uL (ref 0.7–4.0)
Lymphocytes Relative: 13 %
MCH: 33.4 pg (ref 26.0–34.0)
MCHC: 35.5 g/dL (ref 30.0–36.0)
MCV: 94.1 fL (ref 80.0–100.0)
MONO ABS: 1 10*3/uL (ref 0.1–1.0)
MONOS PCT: 7 %
Neutro Abs: 10.7 10*3/uL — ABNORMAL HIGH (ref 1.7–7.7)
Neutrophils Relative %: 74 %
PLATELETS: 295 10*3/uL (ref 150–400)
RBC: 3.05 MIL/uL — ABNORMAL LOW (ref 4.22–5.81)
RDW: 13.8 % (ref 11.5–15.5)
WBC: 14.4 10*3/uL — ABNORMAL HIGH (ref 4.0–10.5)
nRBC: 0.1 % (ref 0.0–0.2)

## 2018-04-29 LAB — TROPONIN I: Troponin I: <0.03 ng/mL (ref <0.03)

## 2018-04-29 LAB — URINALYSIS, ROUTINE W REFLEX MICROSCOPIC
BILIRUBIN URINE: NEGATIVE
Glucose, UA: NEGATIVE mg/dL
Hgb urine dipstick: NEGATIVE
Ketones, ur: NEGATIVE mg/dL
Leukocytes, UA: NEGATIVE
Nitrite: NEGATIVE
Protein, ur: NEGATIVE mg/dL
SPECIFIC GRAVITY, URINE: 1.01 (ref 1.005–1.030)
pH: 5 (ref 5.0–8.0)

## 2018-04-29 LAB — CBC
HCT: 25.3 % — ABNORMAL LOW (ref 39.0–52.0)
Hemoglobin: 8.9 g/dL — ABNORMAL LOW (ref 13.0–17.0)
MCH: 33.8 pg (ref 26.0–34.0)
MCHC: 35.2 g/dL (ref 30.0–36.0)
MCV: 96.2 fL (ref 80.0–100.0)
Platelets: 237 10*3/uL (ref 150–400)
RBC: 2.63 MIL/uL — ABNORMAL LOW (ref 4.22–5.81)
RDW: 13.5 % (ref 11.5–15.5)
WBC: 13.8 10*3/uL — ABNORMAL HIGH (ref 4.0–10.5)
nRBC: 0.2 % (ref 0.0–0.2)

## 2018-04-29 LAB — CG4 I-STAT (LACTIC ACID): Lactic Acid, Venous: 0.67 mmol/L (ref 0.5–1.9)

## 2018-04-29 LAB — RETICULOCYTES
Immature Retic Fract: 33.9 % — ABNORMAL HIGH (ref 2.3–15.9)
RBC.: 3.05 MIL/uL — ABNORMAL LOW (ref 4.22–5.81)
RETIC COUNT ABSOLUTE: 101.9 10*3/uL (ref 19.0–186.0)
Retic Ct Pct: 3.3 % — ABNORMAL HIGH (ref 0.4–3.1)

## 2018-04-29 MED ORDER — DIPHENHYDRAMINE HCL 25 MG PO CAPS
25.0000 mg | ORAL_CAPSULE | ORAL | Status: DC | PRN
  Administered 2018-04-30: 25 mg via ORAL
  Filled 2018-04-29: qty 1

## 2018-04-29 MED ORDER — HYDROMORPHONE 1 MG/ML IV SOLN
INTRAVENOUS | Status: DC
  Administered 2018-04-29: 30 mg via INTRAVENOUS
  Administered 2018-04-30: 5.5 mg via INTRAVENOUS
  Administered 2018-04-30: 8.5 mg via INTRAVENOUS
  Administered 2018-04-30: 6 mg via INTRAVENOUS
  Administered 2018-04-30: 2.5 mg via INTRAVENOUS
  Administered 2018-04-30: 8 mg via INTRAVENOUS
  Administered 2018-04-30: 30 mg via INTRAVENOUS
  Administered 2018-05-01: 3.5 mg via INTRAVENOUS
  Administered 2018-05-01: 2.5 mg via INTRAVENOUS
  Administered 2018-05-01: 30 mg via INTRAVENOUS
  Filled 2018-04-29 (×3): qty 30

## 2018-04-29 MED ORDER — HYDROMORPHONE HCL 2 MG/ML IJ SOLN: 2.0000 mg | INTRAMUSCULAR | Status: AC

## 2018-04-29 MED ORDER — SODIUM CHLORIDE 0.9 % IV SOLN
500.0000 mg | INTRAVENOUS | Status: DC
  Filled 2018-04-29 (×2): qty 500

## 2018-04-29 MED ORDER — CYCLOBENZAPRINE HCL 10 MG PO TABS
10.0000 mg | ORAL_TABLET | Freq: Every day | ORAL | Status: DC
  Administered 2018-04-29 – 2018-04-30 (×2): 10 mg via ORAL
  Filled 2018-04-29 (×2): qty 1

## 2018-04-29 MED ORDER — DEXTROSE-NACL 5-0.45 % IV SOLN
INTRAVENOUS | Status: AC
  Administered 2018-04-29: 18:00:00 via INTRAVENOUS

## 2018-04-29 MED ORDER — SODIUM CHLORIDE 0.9% FLUSH: 9.0000 mL | INTRAVENOUS | Status: DC | PRN

## 2018-04-29 MED ORDER — HYDROMORPHONE HCL 1 MG/ML IJ SOLN
0.5000 mg | Freq: Once | INTRAMUSCULAR | Status: AC
  Administered 2018-04-29: 0.5 mg via SUBCUTANEOUS
  Filled 2018-04-29: qty 1

## 2018-04-29 MED ORDER — HYDROMORPHONE HCL 2 MG/ML IJ SOLN
2.0000 mg | INTRAMUSCULAR | Status: AC
  Administered 2018-04-29: 2 mg via INTRAVENOUS
  Filled 2018-04-29: qty 1

## 2018-04-29 MED ORDER — ONDANSETRON HCL 4 MG/2ML IJ SOLN: 4.0000 mg | Freq: Four times a day (QID) | INTRAMUSCULAR | Status: DC | PRN

## 2018-04-29 MED ORDER — SODIUM CHLORIDE 0.9 % IV SOLN
1.0000 g | INTRAVENOUS | Status: DC
  Administered 2018-04-30: 1 g via INTRAVENOUS
  Filled 2018-04-29 (×2): qty 10

## 2018-04-29 MED ORDER — DEXTROSE 5 % AND 0.45 % NACL IV BOLUS
1000.0000 mL | Freq: Once | INTRAVENOUS | Status: AC
  Administered 2018-04-29: 1000 mL via INTRAVENOUS

## 2018-04-29 MED ORDER — ONDANSETRON HCL 4 MG/2ML IJ SOLN
4.0000 mg | INTRAMUSCULAR | Status: DC | PRN
  Administered 2018-04-29: 4 mg via INTRAVENOUS
  Filled 2018-04-29: qty 2

## 2018-04-29 MED ORDER — DIPHENHYDRAMINE HCL 25 MG PO CAPS
25.0000 mg | ORAL_CAPSULE | ORAL | Status: DC | PRN
  Administered 2018-04-29: 50 mg via ORAL
  Filled 2018-04-29 (×2): qty 1

## 2018-04-29 MED ORDER — NALOXEGOL OXALATE 25 MG PO TABS
25.0000 mg | ORAL_TABLET | Freq: Every day | ORAL | Status: DC
  Administered 2018-04-29 – 2018-05-01 (×3): 25 mg via ORAL
  Filled 2018-04-29 (×3): qty 1

## 2018-04-29 MED ORDER — NALOXONE HCL 0.4 MG/ML IJ SOLN: 0.4000 mg | INTRAMUSCULAR | Status: DC | PRN

## 2018-04-29 MED ORDER — ENOXAPARIN SODIUM 40 MG/0.4ML ~~LOC~~ SOLN
40.0000 mg | SUBCUTANEOUS | Status: DC
  Administered 2018-04-30: 40 mg via SUBCUTANEOUS
  Filled 2018-04-29 (×2): qty 0.4

## 2018-04-29 MED ORDER — SODIUM CHLORIDE 0.9 % IV SOLN
500.0000 mg | Freq: Once | INTRAVENOUS | Status: AC
  Administered 2018-04-29: 500 mg via INTRAVENOUS
  Filled 2018-04-29: qty 500

## 2018-04-29 MED ORDER — KETOROLAC TROMETHAMINE 30 MG/ML IJ SOLN
30.0000 mg | INTRAMUSCULAR | Status: AC
  Administered 2018-04-29: 30 mg via INTRAVENOUS
  Filled 2018-04-29: qty 1

## 2018-04-29 MED ORDER — MORPHINE SULFATE ER 30 MG PO TBCR
60.0000 mg | EXTENDED_RELEASE_TABLET | Freq: Two times a day (BID) | ORAL | Status: DC
  Administered 2018-04-29 – 2018-05-01 (×4): 60 mg via ORAL
  Filled 2018-04-29 (×4): qty 2

## 2018-04-29 MED ORDER — KETOROLAC TROMETHAMINE 15 MG/ML IJ SOLN
15.0000 mg | Freq: Four times a day (QID) | INTRAMUSCULAR | Status: DC
  Administered 2018-04-30 – 2018-05-01 (×7): 15 mg via INTRAVENOUS
  Filled 2018-04-29 (×7): qty 1

## 2018-04-29 MED ORDER — HYDROXYUREA 500 MG PO CAPS
500.0000 mg | ORAL_CAPSULE | Freq: Two times a day (BID) | ORAL | Status: DC
  Administered 2018-04-29 – 2018-05-01 (×4): 500 mg via ORAL
  Filled 2018-04-29 (×4): qty 1

## 2018-04-29 MED ORDER — SODIUM CHLORIDE 0.9 % IV SOLN
1.0000 g | Freq: Once | INTRAVENOUS | Status: AC
  Administered 2018-04-29: 1 g via INTRAVENOUS
  Filled 2018-04-29: qty 10

## 2018-04-29 MED ORDER — FOLIC ACID 1 MG PO TABS
1.0000 mg | ORAL_TABLET | Freq: Every evening | ORAL | Status: DC
  Administered 2018-04-29 – 2018-04-30 (×2): 1 mg via ORAL
  Filled 2018-04-29 (×2): qty 1

## 2018-04-29 NOTE — ED Provider Notes (Addendum)
[facility] DEPT Provider Note   CSN: [id number] Arrival date & time: 04/29/18  1135     History   Chief Complaint Chief Complaint  Patient presents with  . Sickle Cell Pain Crisis    HPI [name] is a 43 y.o. male.  [name] is a 43 y.o. male with a history of sickle cell, avascular necrosis of bilateral hips, who presents to the emergency department for evaluation of right-sided chest pain.  Patient reports this pain has been present and worsening over the past 3 days.  He rates it as a 10/10, reports is worse with movement, palpation and pain becomes a little bit sharper with deep breath.  Reports mild associated shortness of breath.  No associated lower extremity swelling or pain.  He reports some associated cough, no fevers or chills.  No rhinorrhea, congestion or sore throat.  He denies any pain elsewhere, no abdominal pain, nausea or vomiting, no headaches, vision changes, numbness tingling or weakness.  He reports his sickle cell pain is typically in his back and legs and this pain is atypical for him.  It has not been improved with his home ibuprofen and morphine.  No known sick contacts.  Denies any other aggravating or alleviating factors.     Past Medical History:  Diagnosis Date  . Avascular necrosis of hip ([place])    bilateral  . Avascular necrosis of hip, left ([place]) 08/27/2011  . Blood transfusion   . Infection of bone, shoulder region ([place])    left shoulder  . Pneumonia   . Sickle cell crisis [name])     Patient Active Problem List   Diagnosis Date Noted  . Sickle cell anemia with crisis ([name]) 01/22/2018  . Sickle cell crisis ([name]) 01/20/2017  . Sickle cell anemia ([name]) 08/13/2015  . Sickle cell disease, type [place] ([name]) 10/18/2012  . Sickle cell pain crisis ([name]) 10/13/2012  . History of tobacco abuse 10/13/2012  . Anemia 11/08/2011  . Avascular necrosis of hip, left ([name]) 08/27/2011    Past Surgical History:  Procedure  Laterality Date  . BONE GRAFT HIP ILIAC CREST    . JOINT REPLACEMENT  2006   right total hip arthroplasty  . Orif right hip fracture  1995        Home Medications    Prior to Admission medications   Medication Sig Start Date End Date Taking? Authorizing Provider  cyclobenzaprine (FLEXERIL) 10 MG tablet Take 10 mg by mouth at bedtime.   Yes [provider]  folic acid (FOLVITE) 1 MG tablet Take 1 mg by mouth every evening.    Yes [provider]  hydroxyurea (HYDREA) 500 MG capsule Take 500 mg by mouth 2 (two) times daily. May take with food to minimize GI side effects.   Yes [provider]  ibuprofen (ADVIL,MOTRIN) 800 MG tablet Take 800 mg by mouth every 8 (eight) hours as needed for moderate pain.  09/07/16  Yes [provider]  morphine (MS CONTIN) 60 MG 12 hr tablet Take 60 mg by mouth 2 (two) times daily. 12/29/16  Yes [provider]  morphine (MSIR) 30 MG tablet Take 1 tablet (30 mg total) by mouth daily as needed for severe pain (pain). Resume after completing dilaudid. 02/17/16  Yes Malik Gamer, MD  MOVANTIK 25 MG TABS tablet Take 25 mg by mouth daily. 12/17/17  Yes [provider]  promethazine (PHENERGAN) 25 MG tablet Take 25 mg by mouth every  6 (six) hours as needed for nausea (nausea).    Yes [provider]  NARCAN 4 MG/0.1ML LIQD nasal spray kit Place 4 mg into the nose as needed (overdose).  12/16/17   [provider]    Family History Family History  Adopted: Yes    Social History Social History   Tobacco Use  . Smoking status: Current Some Day Smoker    Packs/day: 0.50    Types: Cigarettes    Last attempt to quit: 01/27/2012    Years since quitting: 6.2  . Smokeless tobacco: Never Used  Substance Use Topics  . Alcohol use: No  . Drug use: No     Allergies   Patient has no known allergies.   Review of Systems Review of Systems  Constitutional: Negative for chills and fever.   HENT: Negative for congestion, rhinorrhea and sore throat.   Eyes: Negative for visual disturbance.  Respiratory: Positive for cough and shortness of breath. Negative for chest tightness and wheezing.   Cardiovascular: Positive for chest pain. Negative for palpitations and leg swelling.  Gastrointestinal: Negative for abdominal pain, nausea and vomiting.  Genitourinary: Negative for dysuria and flank pain.  Musculoskeletal: Negative for arthralgias, back pain and myalgias.  Skin: Negative for color change and rash.  Neurological: Negative for dizziness, syncope, light-headedness and headaches.     Physical Exam Updated Vital Signs BP 133/85   Pulse 82   Temp 98.3 F (36.8 C) (Oral)   Resp 12   SpO2 95%   Physical Exam Vitals signs and nursing note reviewed.  Constitutional:      General: He is not in acute distress.    Appearance: Normal appearance. He is well-developed. He is not ill-appearing or diaphoretic.  HENT:     Head: Normocephalic and atraumatic.     Nose: Nose normal.     Mouth/Throat:     Mouth: Mucous membranes are moist.     Pharynx: Oropharynx is clear. No posterior oropharyngeal erythema.  Eyes:     General:        Right eye: No discharge.        Left eye: No discharge.     Extraocular Movements: Extraocular movements intact.     Pupils: Pupils are equal, round, and reactive to light.  Neck:     Musculoskeletal: Neck supple. No neck rigidity.  Cardiovascular:     Rate and Rhythm: Normal rate and regular rhythm.     Pulses: Normal pulses.     Heart sounds: Normal heart sounds. No murmur. No friction rub. No gallop.   Pulmonary:     Effort: Pulmonary effort is normal. No respiratory distress.     Breath sounds: Normal breath sounds. No wheezing or rales.     Comments: Respirations equal and unlabored, patient able to speak in full sentences, lungs clear to auscultation bilaterally, chest tender to palpation over the right upper chest wall without  overlying skin changes or palpable deformity Chest:     Chest wall: Tenderness present.  Abdominal:     General: Abdomen is flat. Bowel sounds are normal. There is no distension.     Palpations: Abdomen is soft. There is no mass.     Tenderness: There is no abdominal tenderness. There is no guarding.     Comments: Abdomen soft, nondistended, nontender to palpation in all quadrants without guarding or peritoneal signs  Musculoskeletal:        General: No deformity.     Right lower leg: No  edema.     Left lower leg: No edema.  Skin:    General: Skin is warm and dry.     Capillary Refill: Capillary refill takes less than 2 seconds.  Neurological:     Mental Status: He is alert and oriented to person, place, and time. Mental status is at baseline.     Coordination: Coordination normal.     Comments: Speech is clear, able to follow commands CN III-XII intact Normal strength in upper and lower extremities bilaterally including dorsiflexion and plantar flexion, strong and equal grip strength Sensation normal to light and sharp touch Moves extremities without ataxia, coordination intact   Psychiatric:        Mood and Affect: Mood normal.        Behavior: Behavior normal.      ED Treatments / Results  Labs (all labs ordered are listed, but only abnormal results are displayed) Labs Reviewed  COMPREHENSIVE METABOLIC PANEL - Abnormal; Notable for the following components:      Result Value   Total Bilirubin 1.7 (*)    All other components within normal limits  CBC WITH DIFFERENTIAL/PLATELET - Abnormal; Notable for the following components:   WBC 14.4 (*)    RBC 3.05 (*)    Hemoglobin 10.2 (*)    HCT 28.7 (*)    Neutro Abs 10.7 (*)    Eosinophils Absolute 0.7 (*)    Abs Immature Granulocytes 0.09 (*)    All other components within normal limits  RETICULOCYTES - Abnormal; Notable for the following components:   Retic Ct Pct 3.3 (*)    RBC. 3.05 (*)    Immature Retic Fract 33.9  (*)    All other components within normal limits  CULTURE, BLOOD (ROUTINE X 2)  CULTURE, BLOOD (ROUTINE X 2)  TROPONIN I  URINALYSIS, ROUTINE W REFLEX MICROSCOPIC  I-STAT CG4 LACTIC ACID, ED  CG4 I-STAT (LACTIC ACID)    EKG None  Radiology Dg Chest 2 View  Result Date: 04/29/2018 CLINICAL DATA:  43 year old male with a history of sickle cell disease and chest pain EXAM: CHEST - 2 VIEW COMPARISON:  01/20/2017, 09/17/2016, CT 01/20/2017 FINDINGS: Cardiomediastinal silhouette unchanged in size and contour. Patchy opacity at the periphery of right lung, not present on prior plain film. Similar appearance of mixed reticulonodular opacities of the lungs. No pneumothorax or pleural effusion. Similar appearance of architectural distortion of predominantly the right lung. No displaced fracture. IMPRESSION: Opacity at the periphery of the right lung with additional bilateral reticulonodular opacities, potentially representing acute chest syndrome and/or multifocal pneumonia. Electronically Signed   By: Corrie Mckusick D.O.   On: 04/29/2018 12:42    Procedures .Critical Care Performed by: Jacqlyn Larsen, PA-C Authorized by: Jacqlyn Larsen, PA-C   Critical care provider statement:    Critical care time (minutes):  45   Critical care was necessary to treat or prevent imminent or life-threatening deterioration of the following conditions:  Respiratory failure (Sickle cell anemia with acute chest syndrome)   Critical care was time spent personally by me on the following activities:  Discussions with consultants, evaluation of patient's response to treatment, examination of patient, ordering and performing treatments and interventions, ordering and review of laboratory studies, ordering and review of radiographic studies, pulse oximetry, re-evaluation of patient's condition, obtaining history from patient or surrogate and review of old charts   I assumed direction of critical care for this patient from  another provider in my specialty: no     (  including critical care time)  Medications Ordered in ED Medications  dextrose 5 % and 0.45% NaCl 5-0.45 % bolus 1,000 mL (has no administration in time range)  cefTRIAXone (ROCEPHIN) 1 g in sodium chloride 0.9 % 100 mL IVPB (has no administration in time range)  azithromycin (ZITHROMAX) 500 mg in sodium chloride 0.9 % 250 mL IVPB (has no administration in time range)  ketorolac (TORADOL) 30 MG/ML injection 30 mg (has no administration in time range)  HYDROmorphone (DILAUDID) injection 2 mg (has no administration in time range)    Or  HYDROmorphone (DILAUDID) injection 2 mg (has no administration in time range)  HYDROmorphone (DILAUDID) injection 2 mg (has no administration in time range)    Or  HYDROmorphone (DILAUDID) injection 2 mg (has no administration in time range)  ondansetron (ZOFRAN) injection 4 mg (has no administration in time range)  diphenhydrAMINE (BENADRYL) capsule 25-50 mg (has no administration in time range)  HYDROmorphone (DILAUDID) injection 0.5 mg (0.5 mg Subcutaneous Given 04/29/18 1354)     Initial Impression / Assessment and Plan / ED Course  I have reviewed the triage vital signs and the nursing notes.  Pertinent labs & imaging results that were available during my care of the patient were reviewed by me and considered in my medical decision making (see chart for details).  43 year old male with history of sickle cell anemia, presents for evaluation of 3 days of worsening right-sided chest pain, pain is worse with movement and palpation, and does become sharper with breathing.  He has had an intermittent cough, denies any fevers, reports he feels mildly short of breath, not requiring any oxygen with normal vitals on arrival.  No associated lower extremity swelling or pain.  Reports his typical sickle cell crisis pain is often in his back and legs and this is different for him.  No known sick contacts.  On exam he has  tenderness over the right upper chest wall, lungs are clear to auscultation, abdomen is nontender.  EKG, basic labs and chest x-ray ordered from triage.  Labs significant for leukocytosis of 14.4 with left shift, stable hemoglobin, no acute electrolyte derangements, normal renal and liver function, retake count at baseline.  Negative troponin.  Chest x-ray shows a right upper peripheral lung infiltrate with several reticulonodular opacities concerning for acute chest syndrome.  Patient's vitals are stable at this time, no fever tachycardia, tachypnea or hypoxia, but will get lactic acid, blood cultures and patient will be started on Rocephin and azithromycin, IV fluids as well as pain medication.  Patient will need to be admitted for further treatment and evaluation.  Case discussed with Cammie Sickle NP, with Sickle Cell service who will see and admit the patient.  Final Clinical Impressions(s) / ED Diagnoses   Final diagnoses:  Acute chest syndrome ([place])  Pneumonia of right upper lobe due to infectious organism [facility])  Sickle cell pain crisis [facility])    ED Discharge Orders    None       Jacqlyn Larsen, PA-C 04/29/18 1618    Jacqlyn Larsen, PA-C 04/30/18 0029    Lacretia Leigh, MD 04/30/18 2058

## 2018-04-29 NOTE — ED Notes (Signed)
Per Triage RN, patient would like to wait for labs

## 2018-04-29 NOTE — ED Triage Notes (Addendum)
Patient c/o sickle cell crisis.  C/o pain mid chest x3days.  10/10 pain   A/Ox4 Ambulatory in triage.  Patient wants to wait for blood work to be done once he has a room. Patient states he is a hard stick.

## 2018-04-29 NOTE — ED Notes (Signed)
ED TO INPATIENT HANDOFF REPORT  Name/Age/Gender [name] 43 y.o. male  Code Status Code Status History    Date Active Date Inactive Code Status Order ID Comments User Context   01/22/2018 1437 01/25/2018 1515 Full Code [id number]  Rometta Emery, MD ED   01/20/2017 1833 01/23/2017 1601 Full Code [id number]  Alison Murray, MD Inpatient   09/17/2016 1536 09/21/2016 1513 Full Code [id number]  Altha Harm, MD Inpatient   08/13/2016 1700 08/17/2016 1901 Full Code [id number]  Calvert Cantor, MD ED   04/21/2016 2057 04/24/2016 1632 Full Code [id number]  Renae Fickle, MD Inpatient   03/22/2016 1412 03/26/2016 1747 Full Code [id number]  Rometta Emery, MD Inpatient   02/11/2016 1654 02/17/2016 1610 Full Code [id number]  Altha Harm, MD Inpatient   01/02/2016 1446 01/07/2016 1705 Full Code [id number]  Altha Harm, MD ED   12/05/2015 1802 12/10/2015 1808 Full Code [id number]  Narda Bonds, MD Inpatient   10/16/2015 1745 10/20/2015 1406 Full Code [id number]  Altha Harm, MD Inpatient   07/14/2015 1315 07/19/2015 1815 Full Code [id number]  Rometta Emery, MD ED   05/25/2015 1459 05/29/2015 1917 Full Code [id number]  Rometta Emery, MD Inpatient   03/19/2015 0140 03/22/2015 1936 Full Code [id number]  Therisa Doyne, MD Inpatient   02/08/2015 2313 02/13/2015 1415 Full Code [id number]  Therisa Doyne, MD Inpatient   01/30/2015 0243 02/02/2015 1324 Full Code [id number]  Eduard Clos, MD Inpatient   10/12/2014 1835 10/15/2014 1348 Full Code [id number]  Renae Fickle, MD Inpatient   05/31/2014 1134 06/04/2014 1528 Full Code [id number]  Rhetta Mura, MD ED   05/06/2014 0117 05/09/2014 2038 Full Code [id number]  Therisa Doyne, MD Inpatient   02/06/2014 2105 02/09/2014 1926 Full Code [id number]  Yevonne Pax, MD Inpatient   01/25/2014 2101 01/27/2014 1640 Full Code [id number]  Hillary Bow, DO ED   10/25/2013 0050 10/27/2013 1900 Full Code [id number]  Ron Parker, MD Inpatient   05/25/2013 0206 05/28/2013 1512 Full Code [id number]  Jeralyn Bennett, MD Inpatient   10/13/2012 1735 10/21/2012 1455 Full Code [id number]  Laveda Norman, MD Inpatient   11/08/2011 0230 11/20/2011 1807 Full Code [id number]  Ron Parker, MD ED   08/21/2011 [phone] 08/27/2011 1838 Full Code [id number]  Tracie Harrier, RN Inpatient   05/13/2011 0743 05/22/2011 1707 Full Code [id number]  McVey, Kathlynn Grate, RN ED      Home/SNF/Other Home  Chief Complaint sickle cell pain, chest pain  Level of Care/Admitting Diagnosis ED Disposition    ED Disposition Condition Comment   Admit  Hospital Area: [facility] [[id number]]  Level of Care: Telemetry [5]  Admit to tele based on following criteria: Complex arrhythmia (Bradycardia/Tachycardia)  Diagnosis: Sickle cell crisis acute chest syndrome [facility]) [[id number]]  Admitting Physician: Rometta Emery [[id number]]  Attending Physician: Rometta Emery [[id number]]  Estimated length of stay: past midnight tomorrow  Certification:: I certify this patient will need inpatient services for at least 2 midnights  PT Class (Do Not Modify): Inpatient [101]  PT Acc Code (Do Not Modify): Private [1]       Medical History Past Medical History:  Diagnosis Date  . Avascular necrosis of hip ([facility])    bilateral  . Avascular necrosis of hip, left ([facility]) 08/27/2011  . Blood transfusion   . Infection of bone, shoulder region ([facility])    left shoulder  . Pneumonia   . Sickle cell crisis ([facility])  Allergies No Known Allergies  IV Location/Drains/Wounds Patient Lines/Drains/Airways Status   Active Line/Drains/Airways    Name:   Placement date:   Placement time:   Site:   Days:   Peripheral IV 04/29/18 Left Forearm   04/29/18    1336    Forearm   less than 1          Labs/Imaging Results for orders placed or performed during the hospital encounter of 04/29/18 (from the past 48 hour(s))  Comprehensive metabolic panel     Status:  Abnormal   Collection Time: 04/29/18  1:38 PM  Result Value Ref Range   Sodium 139 135 - 145 mmol/L   Potassium 4.0 3.5 - 5.1 mmol/L   Chloride 102 98 - 111 mmol/L   CO2 27 22 - 32 mmol/L   Glucose, Bld 98 70 - 99 mg/dL   BUN 12 6 - 20 mg/dL   Creatinine, Ser 8.18 0.61 - 1.24 mg/dL   Calcium 9.5 8.9 - 29.9 mg/dL   Total Protein 7.8 6.5 - 8.1 g/dL   Albumin 4.5 3.5 - 5.0 g/dL   AST 21 15 - 41 U/L   ALT 12 0 - 44 U/L   Alkaline Phosphatase 77 38 - 126 U/L   Total Bilirubin 1.7 (H) 0.3 - 1.2 mg/dL   GFR calc non Af Amer >60 >60 mL/min   GFR calc Af Amer >60 >60 mL/min   Anion gap 10 5 - 15    Comment: Performed at [facility], [address], [place], [place] [address]  CBC with Differential     Status: Abnormal   Collection Time: 04/29/18  1:38 PM  Result Value Ref Range   WBC 14.4 (H) 4.0 - 10.5 K/uL   RBC 3.05 (L) 4.22 - 5.81 MIL/uL   Hemoglobin 10.2 (L) 13.0 - 17.0 g/dL   HCT 67.8 (L) 93.8 - 10.1 %   MCV 94.1 80.0 - 100.0 fL   MCH 33.4 26.0 - 34.0 pg   MCHC 35.5 30.0 - 36.0 g/dL   RDW 75.1 02.5 - 85.2 %   Platelets 295 150 - 400 K/uL   nRBC 0.1 0.0 - 0.2 %   Neutrophils Relative % 74 %   Neutro Abs 10.7 (H) 1.7 - 7.7 K/uL   Lymphocytes Relative 13 %   Lymphs Abs 1.8 0.7 - 4.0 K/uL   Monocytes Relative 7 %   Monocytes Absolute 1.0 0.1 - 1.0 K/uL   Eosinophils Relative 5 %   Eosinophils Absolute 0.7 (H) 0.0 - 0.5 K/uL   Basophils Relative 0 %   Basophils Absolute 0.1 0.0 - 0.1 K/uL   Immature Granulocytes 1 %   Abs Immature Granulocytes 0.09 (H) 0.00 - 0.07 K/uL    Comment: Performed at [facility], [address] [place], [place] [address]  Reticulocytes     Status: Abnormal   Collection Time: 04/29/18  1:38 PM  Result Value Ref Range   Retic Ct Pct 3.3 (H) 0.4 - 3.1 %   RBC. 3.05 (L) 4.22 - 5.81 MIL/uL   Retic Count, Absolute 101.9 19.0 - 186.0 K/uL   Immature Retic Fract 33.9 (H) 2.3 - 15.9 %    Comment: Performed at  [facility], [address], [place], [place] [address]  Troponin I - Add-On to previous collection     Status: None   Collection Time: 04/29/18  1:47 PM  Result Value Ref Range   Troponin I <0.03 <0.03  ng/mL    Comment: Performed at [facility], [address] [place], [place] [id number]  CG4 I-STAT (Lactic acid)     Status: None   Collection Time: 04/29/18  3:40 PM  Result Value Ref Range   Lactic Acid, Venous 0.67 0.5 - 1.9 mmol/L   Dg Chest 2 View  Result Date: 04/29/2018 CLINICAL DATA:  43 year old male with a history of sickle cell disease and chest pain EXAM: CHEST - 2 VIEW COMPARISON:  01/20/2017, 09/17/2016, CT 01/20/2017 FINDINGS: Cardiomediastinal silhouette unchanged in size and contour. Patchy opacity at the periphery of right lung, not present on prior plain film. Similar appearance of mixed reticulonodular opacities of the lungs. No pneumothorax or pleural effusion. Similar appearance of architectural distortion of predominantly the right lung. No displaced fracture. IMPRESSION: Opacity at the periphery of the right lung with additional bilateral reticulonodular opacities, potentially representing acute chest syndrome and/or multifocal pneumonia. Electronically Signed   By: Gilmer MorJaime  Wagner D.O.   On: 04/29/2018 12:42   None  Pending Labs Unresulted Labs (From admission, onward)    Start     Ordered   04/29/18 1513  Urinalysis, Routine w reflex microscopic  ONCE - STAT,   STAT     04/29/18 1513   04/29/18 1511  Blood culture (routine x 2)  BLOOD CULTURE X 2,   STAT     04/29/18 1510   Signed and Held  CBC  (enoxaparin (LOVENOX)    CrCl >/= 30 ml/min)  Once,   R    Comments:  Baseline for enoxaparin therapy IF NOT ALREADY DRAWN.  Notify MD if PLT < 100 K.    Signed and Held   Signed and Held  Creatinine, serum  (enoxaparin (LOVENOX)    CrCl >/= 30 ml/min)  Once,   R    Comments:  Baseline for enoxaparin therapy IF NOT ALREADY DRAWN.     Signed and Held   Signed and Held  Creatinine, serum  (enoxaparin (LOVENOX)    CrCl >/= 30 ml/min)  Weekly,   R    Comments:  while on enoxaparin therapy    Signed and Held   Signed and Held  Comprehensive metabolic panel  Tomorrow morning,   R     Signed and Held   Signed and Held  CBC  Tomorrow morning,   R     Signed and Held   Signed and Held  Urinalysis, Routine w reflex microscopic  Once,   R     Signed and Held          Vitals/Pain Today's Vitals   04/29/18 1140 04/29/18 1400 04/29/18 1448  BP: (!) 140/91 133/85   Pulse: 95 82   Resp: 17 12   Temp: 98.3 F (36.8 C)    TempSrc: Oral    SpO2: 97% 95%   PainSc: 10-Worst pain ever  10-Worst pain ever    Isolation Precautions No active isolations  Medications Medications  dextrose 5 % and 0.45% NaCl 5-0.45 % bolus 1,000 mL (1,000 mLs Intravenous New Bag/Given 04/29/18 1540)  azithromycin (ZITHROMAX) 500 mg in sodium chloride 0.9 % 250 mL IVPB (500 mg Intravenous New Bag/Given 04/29/18 1619)  ondansetron (ZOFRAN) injection 4 mg (4 mg Intravenous Given 04/29/18 1522)  diphenhydrAMINE (BENADRYL) capsule 25-50 mg (50 mg Oral Given 04/29/18 1539)  HYDROmorphone (DILAUDID) injection 0.5 mg (0.5 mg Subcutaneous Given 04/29/18 1354)  cefTRIAXone (ROCEPHIN) 1 g in sodium chloride 0.9 % 100 mL IVPB (0 g Intravenous  Stopped 04/29/18 1609)  ketorolac (TORADOL) 30 MG/ML injection 30 mg (30 mg Intravenous Given 04/29/18 1522)  HYDROmorphone (DILAUDID) injection 2 mg (2 mg Intravenous Given 04/29/18 1522)    Or  HYDROmorphone (DILAUDID) injection 2 mg ( Subcutaneous See Alternative 04/29/18 1522)  HYDROmorphone (DILAUDID) injection 2 mg (2 mg Intravenous Given 04/29/18 1614)    Or  HYDROmorphone (DILAUDID) injection 2 mg ( Subcutaneous See Alternative 04/29/18 1614)    Mobility walks

## 2018-04-29 NOTE — H&P (Signed)
H&P  Patient Demographics:  [name], is a 43 y.o. male  MRN: [id number]   DOB - 01/14/1976  Admit Date - 04/29/2018  Outpatient Primary MD for the patient is Nolene Ebbs, MD  Chief Complaint  Patient presents with  . Sickle Cell Pain Crisis      HPI:   [name]  is a 43 y.o. male history significant for sickle cell anemia, chronic pain syndrome, avascular necrosis of left hip, s/p right hip replacement, and tobacco dependence presented to the emergency department complaining of right chest pain for 3 days.  Patient states that chest pain is not consistent with typical sickle cell crisis.  Patient says that chest pain is worsened by persistent coughing. Cough is non-productive. Patient characterizes pain as constant, sharp, and non radiating. Pain has been unrelieved by home pain medications. Current pain intensity is 9/10.  Patient endorses shortness of breath with exertion.  He denies hemoptysis.  He also denies fever, or chills.  Patient has not traveled recently.  Patient denies headache, heart palpitations, paresthesias, dizziness, dysuria, nausea, vomiting, or diarrhea.  ER Course:  WBCs 14.4, hemoglobin 10.2, creatinine 0.78, and bilirubin 1.7.  Blood pressure 123/84, heart rate 85, oxygen saturation 95% on room air.  Chest x-ray shows opacity at the periphery of the right lung with additional bilateral reticulonodular opacities, potentially representing acute chest syndrome and/or multifocal pneumonia.  Blood cultures and urine culture pending.  Cefepime and azithromycin initiated, IV fluids, IV Dilaudid, and IV Toradol.  Notified for inpatient admission  Review of systems:  Review of Systems  Constitutional: Positive for malaise/fatigue. Negative for chills and fever.  HENT: Negative.   Eyes: Negative.   Respiratory: Positive for cough and shortness of breath. Negative for sputum production.   Cardiovascular: Positive for chest pain.  Gastrointestinal: Negative.    Genitourinary: Negative.   Musculoskeletal: Positive for joint pain and neck pain.  Skin: Negative.   Neurological: Negative.   Endo/Heme/Allergies: Negative.   Psychiatric/Behavioral: Negative.  Negative for depression and suicidal ideas.    A full 10 point Review of Systems was done, except as stated above, all other Review of Systems were negative.  With Past History of the following :   Past Medical History:  Diagnosis Date  . Avascular necrosis of hip ([place])    bilateral  . Avascular necrosis of hip, left ([facility]) 08/27/2011  . Blood transfusion   . Infection of bone, shoulder region ([place])    left shoulder  . Pneumonia   . Sickle cell crisis [facility])       Past Surgical History:  Procedure Laterality Date  . BONE GRAFT HIP ILIAC CREST    . JOINT REPLACEMENT  2006   right total hip arthroplasty  . Orif right hip fracture  1995     Social History:   Social History   Tobacco Use  . Smoking status: Current Some Day Smoker    Packs/day: 0.50    Types: Cigarettes    Last attempt to quit: 01/27/2012    Years since quitting: 6.2  . Smokeless tobacco: Never Used  Substance Use Topics  . Alcohol use: No     Lives - At home   Family History :   Family History  Adopted: Yes     Home Medications:   Prior to Admission medications   Medication Sig Start Date End Date Taking? Authorizing Provider  cyclobenzaprine (FLEXERIL) 10 MG tablet Take 10 mg by mouth at bedtime.   Yes [provider]  folic acid (FOLVITE) 1 MG tablet Take 1 mg by mouth every evening.    Yes [provider]  hydroxyurea (HYDREA) 500 MG capsule Take 500 mg by mouth 2 (two) times daily. May take with food to minimize GI side effects.   Yes [provider]  ibuprofen (ADVIL,MOTRIN) 800 MG tablet Take 800 mg by mouth every 8 (eight) hours as needed for moderate pain.  09/07/16  Yes [provider]  morphine (MS CONTIN) 60 MG 12 hr tablet Take 60 mg by mouth 2 (two) times  daily. 12/29/16  Yes [provider]  morphine (MSIR) 30 MG tablet Take 1 tablet (30 mg total) by mouth daily as needed for severe pain (pain). Resume after completing dilaudid. 02/17/16  Yes Leana Gamer, MD  MOVANTIK 25 MG TABS tablet Take 25 mg by mouth daily. 12/17/17  Yes [provider]  promethazine (PHENERGAN) 25 MG tablet Take 25 mg by mouth every 6 (six) hours as needed for nausea (nausea).    Yes [provider]  NARCAN 4 MG/0.1ML LIQD nasal spray kit Place 4 mg into the nose as needed (overdose).  12/16/17   [provider]     Allergies:   No Known Allergies   Physical Exam:   Vitals:   Vitals:   04/29/18 1140 04/29/18 1400  BP: (!) 140/91 133/85  Pulse: 95 82  Resp: 17 12  Temp: 98.3 F (36.8 C)   SpO2: 97% 95%   Physical Exam Constitutional:      General: He is not in acute distress.    Appearance: Normal appearance. He is not diaphoretic.  HENT:     Head: Normocephalic.     Nose: Nose normal.     Mouth/Throat:     Mouth: Mucous membranes are dry.  Neck:     Musculoskeletal: Normal range of motion.  Cardiovascular:     Rate and Rhythm: Normal rate.  Pulmonary:     Effort: Pulmonary effort is normal.     Breath sounds: Examination of the right-upper field reveals decreased breath sounds. Examination of the right-lower field reveals decreased breath sounds. Examination of the left-lower field reveals decreased breath sounds. Decreased breath sounds present. No rhonchi.  Abdominal:     General: Abdomen is flat. Bowel sounds are normal.     Palpations: Abdomen is soft.     Tenderness: There is no right CVA tenderness or left CVA tenderness.  Musculoskeletal: Normal range of motion.  Skin:    General: Skin is warm and dry.  Neurological:     General: No focal deficit present.     Mental Status: He is alert. Mental status is at baseline.     Motor: No weakness.  Psychiatric:        Mood and Affect: Mood normal.         Behavior: Behavior normal.        Thought Content: Thought content normal.        Judgment: Judgment normal.      Data Review:   CBC Recent Labs  Lab 04/29/18 1338  WBC 14.4*  HGB 10.2*  HCT 28.7*  PLT 295  MCV 94.1  MCH 33.4  MCHC 35.5  RDW 13.8  LYMPHSABS 1.8  MONOABS 1.0  EOSABS 0.7*  BASOSABS 0.1   ------------------------------------------------------------------------------------------------------------------  Chemistries  Recent Labs  Lab 04/29/18 1338  NA 139  K 4.0  CL 102  CO2 27  GLUCOSE 98  BUN 12  CREATININE  0.78  CALCIUM 9.5  AST 21  ALT 12  ALKPHOS 77  BILITOT 1.7*   ------------------------------------------------------------------------------------------------------------------ CrCl cannot be calculated (Unknown ideal weight.). ------------------------------------------------------------------------------------------------------------------ No results for input(s): TSH, T4TOTAL, T3FREE, THYROIDAB in the last 72 hours.  Invalid input(s): FREET3  Coagulation profile No results for input(s): INR, PROTIME in the last 168 hours. ------------------------------------------------------------------------------------------------------------------- No results for input(s): DDIMER in the last 72 hours. -------------------------------------------------------------------------------------------------------------------  Cardiac Enzymes Recent Labs  Lab 04/29/18 1347  TROPONINI <0.03   ------------------------------------------------------------------------------------------------------------------ No results found for: BNP  ---------------------------------------------------------------------------------------------------------------  Urinalysis    Component Value Date/Time   COLORURINE YELLOW 03/23/2016 2144   APPEARANCEUR CLEAR 03/23/2016 2144   LABSPEC 1.009 03/23/2016 2144   PHURINE 5.5 03/23/2016 2144   GLUCOSEU NEGATIVE 03/23/2016  2144   HGBUR NEGATIVE 03/23/2016 2144   BILIRUBINUR NEGATIVE 03/23/2016 2144   KETONESUR NEGATIVE 03/23/2016 2144   PROTEINUR NEGATIVE 03/23/2016 2144   UROBILINOGEN 0.2 10/25/2013 0020   NITRITE NEGATIVE 03/23/2016 2144   LEUKOCYTESUR NEGATIVE 03/23/2016 2144    ----------------------------------------------------------------------------------------------------------------   Imaging Results:    Dg Chest 2 View  Result Date: 04/29/2018 CLINICAL DATA:  43 year old male with a history of sickle cell disease and chest pain EXAM: CHEST - 2 VIEW COMPARISON:  01/20/2017, 09/17/2016, CT 01/20/2017 FINDINGS: Cardiomediastinal silhouette unchanged in size and contour. Patchy opacity at the periphery of right lung, not present on prior plain film. Similar appearance of mixed reticulonodular opacities of the lungs. No pneumothorax or pleural effusion. Similar appearance of architectural distortion of predominantly the right lung. No displaced fracture. IMPRESSION: Opacity at the periphery of the right lung with additional bilateral reticulonodular opacities, potentially representing acute chest syndrome and/or multifocal pneumonia. Electronically Signed   By: Corrie Mckusick D.O.   On: 04/29/2018 12:42      Assessment & Plan:  Principal Problem:   Sickle cell crisis acute chest syndrome (HCC) Active Problems:   Anemia of chronic disease   Leukocytosis   History of avascular necrosis of capital femoral epiphysis   Chronic pain syndrome  Sickle cell pain crisis with acute chest syndrome: Patient presented with localized nonradiating chest pain and shortness of breath for 3 days.  Chest x-ray shows opacity at the periphery of the right lung with additional bilateral reticulonodular opacities.  Patient admitted with acute chest syndrome. Admit to telemetry Broad-spectrum antibiotics D5 0.45% normal saline at 100 mL/h Pain control with weight-based Dilaudid PCA, settings of 0.5 mg, 10-minute lockout,  and 3 mg/h Incentive spirometry Supplemental oxygen at 2 L via N/C Follow blood cultures x2 Urinalysis pending   Leukocytosis: WBCs 14.4, patient afebrile. Broad-spectrum antibiotics Blood cultures pending Urine culture pending Repeat CBC with differential in a.m.  Chronic pain syndrome: Continue MS Contin 60 mg every 12 hours Hold MSIR 30 mg, use PCA Dilaudid as substitute  History of opiate-induced constipation: Continue Movantik 25 mg daily  Sickle cell anemia: Hemoglobin 10.2, which is consistent with baseline.  No medical indication for blood transfusion at this time Continue folic acid 1 mg daily Continue hydroxyurea CBC in a.m. Tobacco dependence: Patient counseled at length.  He typically smokes 1 pack/day and is not ready to quit.   No nicotine patch at this time  DVT Prophylaxis: Subcut Lovenox   AM Labs Ordered, also please review Full Orders  Family Communication: Admission, patient's condition and plan of care including tests being ordered have been discussed with the patient who indicate understanding and agree with the plan and Code Status.  Code Status: Full Code  Consults called: None    Admission status: Inpatient    Time spent in minutes : 50 minutes  [place], MSN, FNP-C [facility] [address] [place], [place] [address] [phone]  04/29/2018 at 3:51 PM

## 2018-04-30 DIAGNOSIS — D5701 Hb-SS disease with acute chest syndrome: Principal | ICD-10-CM

## 2018-04-30 LAB — COMPREHENSIVE METABOLIC PANEL
ALK PHOS: 59 U/L (ref 38–126)
ALT: 10 U/L (ref 0–44)
AST: 22 U/L (ref 15–41)
Albumin: 3.8 g/dL (ref 3.5–5.0)
Anion gap: 8 (ref 5–15)
BUN: 12 mg/dL (ref 6–20)
CO2: 26 mmol/L (ref 22–32)
Calcium: 8.6 mg/dL — ABNORMAL LOW (ref 8.9–10.3)
Chloride: 105 mmol/L (ref 98–111)
Creatinine, Ser: 0.7 mg/dL (ref 0.61–1.24)
GFR calc Af Amer: >60 mL/min (ref >60)
GFR calc non Af Amer: >60 mL/min (ref >60)
Glucose, Bld: 105 mg/dL — ABNORMAL HIGH (ref 70–99)
Potassium: 4.2 mmol/L (ref 3.5–5.1)
Sodium: 139 mmol/L (ref 135–145)
Total Bilirubin: 1.4 mg/dL — ABNORMAL HIGH (ref 0.3–1.2)
Total Protein: 7 g/dL (ref 6.5–8.1)

## 2018-04-30 LAB — CBC
HEMATOCRIT: 24.9 % — AB (ref 39.0–52.0)
Hemoglobin: 8.7 g/dL — ABNORMAL LOW (ref 13.0–17.0)
MCH: 33.1 pg (ref 26.0–34.0)
MCHC: 34.9 g/dL (ref 30.0–36.0)
MCV: 94.7 fL (ref 80.0–100.0)
Platelets: 237 10*3/uL (ref 150–400)
RBC: 2.63 MIL/uL — AB (ref 4.22–5.81)
RDW: 13.8 % (ref 11.5–15.5)
WBC: 10 10*3/uL (ref 4.0–10.5)
nRBC: 0.2 % (ref 0.0–0.2)

## 2018-04-30 NOTE — Progress Notes (Signed)
Subjective: A 43 year old gentleman admitted with sickle cell painful crisis.  Patient is on Dilaudid PCA with Toradol and IV fluids.  He is improving with no significant complaint of pain.  No nausea vomiting or diarrhea.  No fever no chills no nausea vomiting or diarrhea.  Objective: Vital signs in last 24 hours: Temp:  [98.1 F (36.7 C)-98.7 F (37.1 C)] 98.1 F (36.7 C) (01/04 0803) Pulse Rate:  [56-95] 56 (01/04 0803) Resp:  [12-18] 12 (01/04 0803) BP: (99-140)/(66-91) 118/68 (01/04 0803) SpO2:  [92 %-97 %] 94 % (01/04 0803) Weight:  [78.2 kg] 78.2 kg (01/03 1700) Weight change:  Last BM Date: 04/28/18  Intake/Output from previous day: 01/03 0701 - 01/04 0700 In: 2296.5 [I.V.:936; IV Piggyback:1360.5] Out: 1050 [Urine:1050] Intake/Output this shift: Total I/O In: -  Out: 1000 [Urine:1000]  General appearance: alert, cooperative, appears stated age and no distress Neck: no adenopathy, no carotid bruit, no JVD, supple, symmetrical, trachea midline and thyroid not enlarged, symmetric, no tenderness/mass/nodules Back: symmetric, no curvature. ROM normal. No CVA tenderness. Resp: clear to auscultation bilaterally Cardio: regular rate and rhythm, S1, S2 normal, no murmur, click, rub or gallop GI: soft, non-tender; bowel sounds normal; no masses,  no organomegaly Extremities: extremities normal, atraumatic, no cyanosis or edema Neurologic: Grossly normal  Lab Results: Recent Labs    04/29/18 1749 04/30/18 0651  WBC 13.8* 10.0  HGB 8.9* 8.7*  HCT 25.3* 24.9*  PLT 237 237   BMET Recent Labs    04/29/18 1338 04/29/18 1749 04/30/18 0651  NA 139  --  139  K 4.0  --  4.2  CL 102  --  105  CO2 27  --  26  GLUCOSE 98  --  105*  BUN 12  --  12  CREATININE 0.78 0.71 0.70  CALCIUM 9.5  --  8.6*    Studies/Results: Dg Chest 2 View  Result Date: 04/29/2018 CLINICAL DATA:  43 year old male with a history of sickle cell disease and chest pain EXAM: CHEST - 2 VIEW  COMPARISON:  01/20/2017, 09/17/2016, CT 01/20/2017 FINDINGS: Cardiomediastinal silhouette unchanged in size and contour. Patchy opacity at the periphery of right lung, not present on prior plain film. Similar appearance of mixed reticulonodular opacities of the lungs. No pneumothorax or pleural effusion. Similar appearance of architectural distortion of predominantly the right lung. No displaced fracture. IMPRESSION: Opacity at the periphery of the right lung with additional bilateral reticulonodular opacities, potentially representing acute chest syndrome and/or multifocal pneumonia. Electronically Signed   By: Gilmer Mor D.O.   On: 04/29/2018 12:42    Medications: I have reviewed the patient's current medications.  Assessment/Plan: A 43 year old gentleman admitted with sickle cell painful crisis.  #1 sickle cell painful crisis: We will continue with Dilaudid PCA Toradol and IV fluids.  #2 anemia of chronic disease: Due to sickle cell disease.  Continue monitor H&H.  #3 chronic pain syndrome: Continue with MS Contin.  #4 tobacco abuse: Nicotine patch will be added in addition to tobacco cessation counseling.  #5 constipation: Continue Movantik  LOS: 1 day   Maliha Outten,[name] 04/30/2018, 10:11 AM

## 2018-05-01 NOTE — Discharge Summary (Signed)
Physician Discharge Summary  Patient ID: [name] MRN: [id number] DOB/AGE: 1976/03/03 43 y.o.  Admit date: 04/29/2018 Discharge date: 05/01/2018  Admission Diagnoses: Sickle cell crisis  Discharge Diagnoses:  Principal Problem:   Sickle cell crisis acute chest syndrome (HCC) Active Problems:   Anemia of chronic disease   Leukocytosis   History of avascular necrosis of capital femoral epiphysis   Chronic pain syndrome   Acute chest syndrome [facility])   Discharged Condition: good  Hospital Course: Patient is a 43 year old gentleman with history of sickle cell disease admitted with sickle cell painful crisis.  He was placed on Dilaudid PCA with Toradol and IV fluids.  Patient responded to treatment and gradually improved.  Transition to oral medications on discharge home to follow-up with PCP.  Patient is functional and pain went from 10 out of 10 on admission to 3 out of 10.  Consults: None  Significant Diagnostic Studies: labs: Serial CBCs and CMP is checked.  They are all within normal limits  Treatments: IV hydration and analgesia: acetaminophen and Dilaudid  Discharge Exam: Blood pressure 115/78, pulse (!) 58, temperature 98.7 F (37.1 C), temperature source Oral, resp. rate 13, height _0  (1.88 m), weight 78.2 kg, SpO2 96 %. General appearance: alert, cooperative and no distress Neck: no adenopathy, no carotid bruit, no JVD, supple, symmetrical, trachea midline and thyroid not enlarged, symmetric, no tenderness/mass/nodules Resp: clear to auscultation bilaterally Cardio: regular rate and rhythm, S1, S2 normal, no murmur, click, rub or gallop GI: soft, non-tender; bowel sounds normal; no masses,  no organomegaly Extremities: extremities normal, atraumatic, no cyanosis or edema Pulses: 2+ and symmetric Skin: Skin color, texture, turgor normal. No rashes or lesions Neurologic: Grossly normal  Disposition: Discharge disposition: 01-Home or Self Care       Discharge  Instructions    Diet - low sodium heart healthy   Complete by:  As directed    Increase activity slowly   Complete by:  As directed      Allergies as of 05/01/2018   No Known Allergies     Medication List    TAKE these medications   cyclobenzaprine 10 MG tablet Commonly known as:  FLEXERIL Take 10 mg by mouth at bedtime.   folic acid 1 MG tablet Commonly known as:  FOLVITE Take 1 mg by mouth every evening.   hydroxyurea 500 MG capsule Commonly known as:  HYDREA Take 500 mg by mouth 2 (two) times daily. May take with food to minimize GI side effects.   ibuprofen 800 MG tablet Commonly known as:  ADVIL,MOTRIN Take 800 mg by mouth every 8 (eight) hours as needed for moderate pain.   morphine 30 MG tablet Commonly known as:  MSIR Take 1 tablet (30 mg total) by mouth daily as needed for severe pain (pain). Resume after completing dilaudid.   morphine 60 MG 12 hr tablet Commonly known as:  MS CONTIN Take 60 mg by mouth 2 (two) times daily.   MOVANTIK 25 MG Tabs tablet Generic drug:  naloxegol oxalate Take 25 mg by mouth daily.   NARCAN 4 MG/0.1ML Liqd nasal spray kit Generic drug:  naloxone Place 4 mg into the nose as needed (overdose).   promethazine 25 MG tablet Commonly known as:  PHENERGAN Take 25 mg by mouth every 6 (six) hours as needed for nausea (nausea).        SignedBarbette Merino 05/01/2018, 11:23 AM   Time spent 26 minutes

## 2018-05-04 LAB — CULTURE, BLOOD (ROUTINE X 2)
CULTURE: NO GROWTH
CULTURE: NO GROWTH
SPECIAL REQUESTS: ADEQUATE
SPECIAL REQUESTS: ADEQUATE

## 2018-12-28 ENCOUNTER — Emergency Department (HOSPITAL_COMMUNITY): Payer: Medicare Other

## 2018-12-28 ENCOUNTER — Inpatient Hospital Stay (HOSPITAL_COMMUNITY)
Admission: EM | Admit: 2018-12-28 | Discharge: 2019-01-01 | DRG: 812 | Disposition: A | Discharge status: Home / Self Care | Payer: Medicare Other | Attending: Internal Medicine | Admitting: Internal Medicine

## 2018-12-28 ENCOUNTER — Encounter (HOSPITAL_COMMUNITY): Payer: Self-pay

## 2018-12-28 ENCOUNTER — Other Ambulatory Visit: Payer: Self-pay

## 2018-12-28 DIAGNOSIS — Z9114 Patient's other noncompliance with medication regimen: Secondary | ICD-10-CM

## 2018-12-28 DIAGNOSIS — Z96641 Presence of right artificial hip joint: Secondary | ICD-10-CM | POA: Diagnosis present

## 2018-12-28 DIAGNOSIS — D5700 Hb-SS disease with crisis, unspecified: Secondary | ICD-10-CM | POA: Diagnosis present

## 2018-12-28 DIAGNOSIS — J849 Interstitial pulmonary disease, unspecified: Secondary | ICD-10-CM | POA: Diagnosis present

## 2018-12-28 DIAGNOSIS — Z87891 Personal history of nicotine dependence: Secondary | ICD-10-CM

## 2018-12-28 DIAGNOSIS — Z20828 Contact with and (suspected) exposure to other viral communicable diseases: Secondary | ICD-10-CM | POA: Diagnosis present

## 2018-12-28 DIAGNOSIS — Z79891 Long term (current) use of opiate analgesic: Secondary | ICD-10-CM

## 2018-12-28 DIAGNOSIS — E876 Hypokalemia: Secondary | ICD-10-CM

## 2018-12-28 DIAGNOSIS — D638 Anemia in other chronic diseases classified elsewhere: Secondary | ICD-10-CM | POA: Diagnosis present

## 2018-12-28 DIAGNOSIS — M879 Osteonecrosis, unspecified: Secondary | ICD-10-CM | POA: Diagnosis present

## 2018-12-28 DIAGNOSIS — Z79899 Other long term (current) drug therapy: Secondary | ICD-10-CM

## 2018-12-28 DIAGNOSIS — J449 Chronic obstructive pulmonary disease, unspecified: Secondary | ICD-10-CM | POA: Diagnosis present

## 2018-12-28 DIAGNOSIS — F1120 Opioid dependence, uncomplicated: Secondary | ICD-10-CM | POA: Diagnosis present

## 2018-12-28 DIAGNOSIS — F17210 Nicotine dependence, cigarettes, uncomplicated: Secondary | ICD-10-CM | POA: Diagnosis present

## 2018-12-28 DIAGNOSIS — G894 Chronic pain syndrome: Secondary | ICD-10-CM | POA: Diagnosis present

## 2018-12-28 LAB — CBC
HCT: 25.1 % — ABNORMAL LOW (ref 39.0–52.0)
Hemoglobin: 9 g/dL — ABNORMAL LOW (ref 13.0–17.0)
MCH: 33 pg (ref 26.0–34.0)
MCHC: 35.9 g/dL (ref 30.0–36.0)
MCV: 91.9 fL (ref 80.0–100.0)
Platelets: 292 10*3/uL (ref 150–400)
RBC: 2.73 MIL/uL — ABNORMAL LOW (ref 4.22–5.81)
RDW: 13.5 % (ref 11.5–15.5)
WBC: 13.8 10*3/uL — ABNORMAL HIGH (ref 4.0–10.5)
nRBC: 0.1 % (ref 0.0–0.2)

## 2018-12-28 LAB — DIFFERENTIAL
Basophils Absolute: 0.1 10*3/uL (ref 0.0–0.1)
Basophils Relative: 1 %
Eosinophils Absolute: 0.9 10*3/uL — ABNORMAL HIGH (ref 0.0–0.5)
Eosinophils Relative: 6 %
Lymphocytes Relative: 25 %
Lymphs Abs: 3.4 10*3/uL (ref 0.7–4.0)
Monocytes Absolute: 1.6 10*3/uL — ABNORMAL HIGH (ref 0.1–1.0)
Monocytes Relative: 12 %
Neutro Abs: 7.5 10*3/uL (ref 1.7–7.7)
Neutrophils Relative %: 56 %

## 2018-12-28 LAB — BASIC METABOLIC PANEL
Anion gap: 11 (ref 5–15)
BUN: 15 mg/dL (ref 6–20)
CO2: 25 mmol/L (ref 22–32)
Calcium: 9.3 mg/dL (ref 8.9–10.3)
Chloride: 102 mmol/L (ref 98–111)
Creatinine, Ser: 0.74 mg/dL (ref 0.61–1.24)
GFR calc Af Amer: >60 mL/min (ref >60)
GFR calc non Af Amer: >60 mL/min (ref >60)
Glucose, Bld: 75 mg/dL (ref 70–99)
Potassium: 3.4 mmol/L — ABNORMAL LOW (ref 3.5–5.1)
Sodium: 138 mmol/L (ref 135–145)

## 2018-12-28 LAB — SARS CORONAVIRUS 2 BY RT PCR (HOSPITAL ORDER, PERFORMED IN ~~LOC~~ HOSPITAL LAB): SARS Coronavirus 2: NEGATIVE

## 2018-12-28 LAB — RETICULOCYTES
Immature Retic Fract: 28.5 % — ABNORMAL HIGH (ref 2.3–15.9)
RBC.: 2.73 MIL/uL — ABNORMAL LOW (ref 4.22–5.81)
Retic Count, Absolute: 59.8 10*3/uL (ref 19.0–186.0)
Retic Ct Pct: 2.2 % (ref 0.4–3.1)

## 2018-12-28 LAB — URINALYSIS, ROUTINE W REFLEX MICROSCOPIC
Bilirubin Urine: NEGATIVE
Glucose, UA: NEGATIVE mg/dL
Hgb urine dipstick: NEGATIVE
Ketones, ur: NEGATIVE mg/dL
Leukocytes,Ua: NEGATIVE
Nitrite: NEGATIVE
Protein, ur: NEGATIVE mg/dL
Specific Gravity, Urine: >1.046 — ABNORMAL HIGH (ref 1.005–1.030)
pH: 5 (ref 5.0–8.0)

## 2018-12-28 LAB — D-DIMER, QUANTITATIVE: D-Dimer, Quant: 2.56 ug/mL-FEU — ABNORMAL HIGH (ref 0.00–0.50)

## 2018-12-28 LAB — TROPONIN I (HIGH SENSITIVITY)
Troponin I (High Sensitivity): 3 ng/L (ref <18)
Troponin I (High Sensitivity): 2 ng/L (ref <18)

## 2018-12-28 MED ORDER — ENOXAPARIN SODIUM 40 MG/0.4ML ~~LOC~~ SOLN
40.0000 mg | SUBCUTANEOUS | Status: DC
  Administered 2018-12-28 – 2018-12-31 (×4): 40 mg via SUBCUTANEOUS
  Filled 2018-12-28 (×4): qty 0.4

## 2018-12-28 MED ORDER — ONDANSETRON HCL 4 MG/2ML IJ SOLN: 4.0000 mg | Freq: Four times a day (QID) | INTRAMUSCULAR | Status: DC | PRN

## 2018-12-28 MED ORDER — SENNOSIDES-DOCUSATE SODIUM 8.6-50 MG PO TABS
1.0000 | ORAL_TABLET | Freq: Two times a day (BID) | ORAL | Status: DC
  Administered 2018-12-28 – 2019-01-01 (×8): 1 via ORAL
  Filled 2018-12-28 (×8): qty 1

## 2018-12-28 MED ORDER — POLYETHYLENE GLYCOL 3350 17 G PO PACK
17.0000 g | PACK | Freq: Every day | ORAL | Status: DC | PRN
  Administered 2018-12-29 – 2018-12-31 (×3): 17 g via ORAL
  Filled 2018-12-28 (×3): qty 1

## 2018-12-28 MED ORDER — SODIUM CHLORIDE 0.9% FLUSH: 3.0000 mL | Freq: Once | INTRAVENOUS | Status: DC

## 2018-12-28 MED ORDER — HYDROMORPHONE HCL 2 MG/ML IJ SOLN
2.0000 mg | INTRAMUSCULAR | Status: AC
  Administered 2018-12-28: 2 mg via INTRAVENOUS
  Filled 2018-12-28: qty 1

## 2018-12-28 MED ORDER — HYDROXYUREA 500 MG PO CAPS
500.0000 mg | ORAL_CAPSULE | Freq: Two times a day (BID) | ORAL | Status: DC
  Administered 2018-12-28 – 2019-01-01 (×8): 500 mg via ORAL
  Filled 2018-12-28 (×8): qty 1

## 2018-12-28 MED ORDER — FOLIC ACID 1 MG PO TABS
1.0000 mg | ORAL_TABLET | Freq: Every evening | ORAL | Status: DC
  Administered 2018-12-28 – 2018-12-31 (×4): 1 mg via ORAL
  Filled 2018-12-28 (×4): qty 1

## 2018-12-28 MED ORDER — NALOXEGOL OXALATE 25 MG PO TABS
25.0000 mg | ORAL_TABLET | Freq: Every day | ORAL | Status: DC
  Administered 2018-12-28 – 2018-12-31 (×4): 25 mg via ORAL
  Filled 2018-12-28 (×4): qty 1

## 2018-12-28 MED ORDER — HYDROMORPHONE HCL 2 MG/ML IJ SOLN: 2.0000 mg | INTRAMUSCULAR | Status: AC

## 2018-12-28 MED ORDER — SODIUM CHLORIDE 0.9% FLUSH: 9.0000 mL | INTRAVENOUS | Status: DC | PRN

## 2018-12-28 MED ORDER — SODIUM CHLORIDE 0.45 % IV BOLUS
1000.0000 mL | Freq: Once | INTRAVENOUS | Status: AC
  Administered 2018-12-28: 1000 mL via INTRAVENOUS

## 2018-12-28 MED ORDER — KETOROLAC TROMETHAMINE 15 MG/ML IJ SOLN
15.0000 mg | Freq: Four times a day (QID) | INTRAMUSCULAR | Status: DC
  Administered 2018-12-28 – 2019-01-01 (×14): 15 mg via INTRAVENOUS
  Filled 2018-12-28 (×14): qty 1

## 2018-12-28 MED ORDER — DIPHENHYDRAMINE HCL 25 MG PO CAPS: 25.0000 mg | ORAL_CAPSULE | ORAL | Status: DC | PRN

## 2018-12-28 MED ORDER — SODIUM CHLORIDE 0.9 % IV SOLN
25.0000 mg | INTRAVENOUS | Status: DC | PRN
  Filled 2018-12-28: qty 0.5

## 2018-12-28 MED ORDER — MORPHINE SULFATE ER 30 MG PO TBCR
60.0000 mg | EXTENDED_RELEASE_TABLET | Freq: Two times a day (BID) | ORAL | Status: DC
  Administered 2018-12-28 – 2019-01-01 (×8): 60 mg via ORAL
  Filled 2018-12-28 (×8): qty 2

## 2018-12-28 MED ORDER — SODIUM CHLORIDE 0.45 % IV SOLN
INTRAVENOUS | Status: DC
  Administered 2018-12-28 – 2018-12-30 (×4): via INTRAVENOUS

## 2018-12-28 MED ORDER — SODIUM CHLORIDE (PF) 0.9 % IJ SOLN
INTRAMUSCULAR | Status: AC
  Administered 2018-12-28: 16:00:00
  Filled 2018-12-28: qty 50

## 2018-12-28 MED ORDER — IOHEXOL 350 MG/ML SOLN
100.0000 mL | Freq: Once | INTRAVENOUS | Status: AC | PRN
  Administered 2018-12-28: 100 mL via INTRAVENOUS

## 2018-12-28 MED ORDER — HYDROMORPHONE 1 MG/ML IV SOLN
INTRAVENOUS | Status: DC
  Administered 2018-12-28: 30 mg via INTRAVENOUS
  Administered 2018-12-29: 4.8 mg via INTRAVENOUS
  Administered 2018-12-29: 04:00:00 via INTRAVENOUS
  Filled 2018-12-28: qty 30

## 2018-12-28 MED ORDER — ONDANSETRON HCL 4 MG/2ML IJ SOLN
4.0000 mg | INTRAMUSCULAR | Status: DC | PRN
  Administered 2018-12-28 – 2018-12-31 (×6): 4 mg via INTRAVENOUS
  Filled 2018-12-28 (×7): qty 2

## 2018-12-28 MED ORDER — DIPHENHYDRAMINE HCL 50 MG/ML IJ SOLN
25.0000 mg | Freq: Once | INTRAMUSCULAR | Status: AC
  Administered 2018-12-28: 25 mg via INTRAVENOUS
  Filled 2018-12-28: qty 1

## 2018-12-28 MED ORDER — HYDROMORPHONE HCL 2 MG/ML IJ SOLN
2.0000 mg | Freq: Once | INTRAMUSCULAR | Status: AC
  Administered 2018-12-28: 2 mg via INTRAVENOUS
  Filled 2018-12-28: qty 1

## 2018-12-28 MED ORDER — HYDROMORPHONE 1 MG/ML IV SOLN
INTRAVENOUS | Status: DC
  Filled 2018-12-28: qty 30

## 2018-12-28 MED ORDER — NALOXONE HCL 0.4 MG/ML IJ SOLN: 0.4000 mg | INTRAMUSCULAR | Status: DC | PRN

## 2018-12-28 MED ORDER — KETOROLAC TROMETHAMINE 15 MG/ML IJ SOLN
15.0000 mg | INTRAMUSCULAR | Status: AC
  Administered 2018-12-28: 15 mg via INTRAVENOUS
  Filled 2018-12-28: qty 1

## 2018-12-28 NOTE — ED Provider Notes (Signed)
[facility] DEPT Provider Note   CSN: [id number] Arrival date & time: 12/28/18  0736     History   Chief Complaint Chief Complaint  Patient presents with  . Sickle Cell Pain Crisis  . Chest Pain    HPI [name] is a 43 y.o. male. Patient is present today for chest pain and shortness of breath.  Patient states he has a history of chronic pain as well as acute chest syndrome and this feels similar degree of pain today.  Patient states pain is worse with deep inhalation and has been constant for 2 weeks and slowly worsening.  Patient takes MS Contin for pain typically but states it is not been helping.  Patient states last CT of his chest was done during last hospitalization in January.      HPI  Past Medical History:  Diagnosis Date  . Avascular necrosis of hip ([place])    bilateral  . Avascular necrosis of hip, left ([place]) 08/27/2011  . Blood transfusion   . Infection of bone, shoulder region ([place])    left shoulder  . Pneumonia   . Sickle cell crisis [facility])     Patient Active Problem List   Diagnosis Date Noted  . Sickle cell crisis acute chest syndrome ([place]) 04/29/2018  . History of avascular necrosis of capital femoral epiphysis 04/29/2018  . Chronic pain syndrome 04/29/2018  . Acute chest syndrome ([place])   . Sickle cell anemia with crisis ([place]) 01/22/2018  . Sickle cell crisis ([place]) 01/20/2017  . Sickle cell anemia ([place]) 08/13/2015  . Sickle cell disease, type [place] ([place]) 10/18/2012  . Sickle cell pain crisis ([place]) 10/13/2012  . History of tobacco abuse 10/13/2012  . Leukocytosis 05/02/2012  . Anemia of chronic disease 11/08/2011  . Avascular necrosis of hip, left ([place]) 08/27/2011    Past Surgical History:  Procedure Laterality Date  . BONE GRAFT HIP ILIAC CREST    . JOINT REPLACEMENT  2006   right total hip arthroplasty  . Orif right hip fracture  1995        Home Medications    Prior to Admission medications   Medication  Sig Start Date End Date Taking? Authorizing Provider  cyclobenzaprine (FLEXERIL) 10 MG tablet Take 10 mg by mouth at bedtime.    [provider]  folic acid (FOLVITE) 1 MG tablet Take 1 mg by mouth every evening.     [provider]  hydroxyurea (HYDREA) 500 MG capsule Take 500 mg by mouth 2 (two) times daily. May take with food to minimize GI side effects.    [provider]  ibuprofen (ADVIL,MOTRIN) 800 MG tablet Take 800 mg by mouth every 8 (eight) hours as needed for moderate pain.  09/07/16   [provider]  morphine (MS CONTIN) 60 MG 12 hr tablet Take 60 mg by mouth 2 (two) times daily. 12/29/16   [provider]  morphine (MSIR) 30 MG tablet Take 1 tablet (30 mg total) by mouth daily as needed for severe pain (pain). Resume after completing dilaudid. 02/17/16   Leana Gamer, MD  MOVANTIK 25 MG TABS tablet Take 25 mg by mouth daily. 12/17/17   [provider]  NARCAN 4 MG/0.1ML LIQD nasal spray kit Place 4 mg into the nose as needed (overdose).  12/16/17   [provider]  promethazine (PHENERGAN) 25 MG tablet Take 25 mg by mouth every 6 (six) hours as needed for nausea (nausea).  [provider]    Family History Family History  Adopted: Yes    Social History Social History   Tobacco Use  . Smoking status: Current Some Day Smoker    Packs/day: 0.50    Types: Cigarettes    Last attempt to quit: 01/27/2012    Years since quitting: 6.9  . Smokeless tobacco: Never Used  Substance Use Topics  . Alcohol use: No  . Drug use: No     Allergies   Patient has no known allergies.   Review of Systems Review of Systems  Constitutional: Negative for chills and fever.  HENT: Negative for congestion, hearing loss, sinus pressure and sore throat.   Eyes: Negative for pain and redness.  Respiratory: Positive for shortness of breath. Negative for cough.   Cardiovascular: Positive for chest pain. Negative for  palpitations.  Gastrointestinal: Negative for abdominal pain, diarrhea, nausea and vomiting.  Genitourinary: Negative for dysuria and hematuria.  Musculoskeletal: Negative for arthralgias.  Skin: Negative for rash.  Neurological: Positive for headaches. Negative for dizziness.     Physical Exam Updated Vital Signs BP 132/90 (BP Location: Right Arm)   Pulse 93   Temp 98.3 F (36.8 C) (Oral)   Resp 16   SpO2 98%   Physical Exam Constitutional:      Appearance: He is not diaphoretic.     Comments: Appears to be in discomfort  HENT:     Head: Normocephalic and atraumatic.     Nose: Nose normal.     Mouth/Throat:     Mouth: Mucous membranes are moist.  Eyes:     General: No scleral icterus. Neck:     Musculoskeletal: Normal range of motion.     Vascular: No JVD.  Cardiovascular:     Rate and Rhythm: Normal rate and regular rhythm.     Heart sounds: No murmur. No friction rub. No gallop.   Pulmonary:     Effort: Pulmonary effort is normal. No respiratory distress.     Breath sounds: Normal breath sounds. No stridor. No wheezing or rhonchi.  Chest:     Chest wall: No tenderness.  Abdominal:     General: Bowel sounds are normal.     Palpations: Abdomen is soft.     Tenderness: There is no abdominal tenderness. There is no guarding.  Musculoskeletal:     Right lower leg: No edema.     Left lower leg: No edema.  Skin:    General: Skin is warm and dry.  Neurological:     Mental Status: He is alert. Mental status is at baseline.  Psychiatric:        Mood and Affect: Mood normal.        Behavior: Behavior normal.      ED Treatments / Results  Labs (all labs ordered are listed, but only abnormal results are displayed) Labs Reviewed  BASIC METABOLIC PANEL  CBC  RETICULOCYTES  TROPONIN I (HIGH SENSITIVITY)  TROPONIN I (HIGH SENSITIVITY)    EKG None  Radiology Dg Chest 2 View  Result Date: 12/28/2018 CLINICAL DATA:  Chest pain, sickle crisis EXAM: CHEST - 2  VIEW COMPARISON:  04/29/2018 FINDINGS: The heart size and mediastinal contours are within normal limits. No change in somewhat coarse underlying interstitial opacity and scarring. No acute appearing airspace opacity. The visualized skeletal structures are unremarkable. IMPRESSION: No change in somewhat coarse underlying interstitial opacity and scarring. No acute appearing airspace opacity. Electronically Signed   By: Dorna Bloom.D.  On: 12/28/2018 09:13    Procedures Procedures (including critical care time)  Medications Ordered in ED Medications - No data to display   Initial Impression / Assessment and Plan / ED Course  I have reviewed the triage vital signs and the nursing notes.  Pertinent labs & imaging results that were available during my care of the patient were reviewed by me and considered in my medical decision making (see chart for details).   Patient summary: Patient is 43 year old male sickle cell patient presenting for chest pain and shortness of breath that started 2 weeks ago and has been worsening.   Patient presentation concerning for sickle crisis, acute chest syndrome, ACS, PE. Most likely exacerbation of sickle cell disease.  On chart review, last chest CTA was conducted 2 years ago (01/20/17) and showed no PE and emphysematous changes.  Patient has elevated white count at 13.8 today but no infectious symptoms most likely acute phase reactant.  Patient is also negative for COVID today.  Troponins normal, d-dimer is elevated pleuritic chest pain with negative pulmonary ROS, symptoms of bronchitis or costocondritis makes PE difficult to rule out.  Patient has elevated immature reticulocytes and is likely experiencing a sickle crisis.  Patient will be given pain medication and reassessed for pain control.  Patient has reassuringly stable hemoglobin compared to prior lab work.  Patient does not appear to be an acute distress at this time.  Will pain control in ED and reassess  for discharge or admission to sickle cell clinic.   CT PE protocol negative for pulmonary embolism.  Patient will be admitted to hospitalist for sickle crisis.  Patient given hydromorphone for pain during ED visit with Minimally improved symptoms    Clinical Course as of Dec 28 1607  Wed Dec 28, 2018  1017 EKG 12-Lead [WF]  1028 ED EKG [WF]  1247 Patient reassessed after 4 mg total of Dilaudid.  Patient states pain is unchanged.  Will order 2 additional milligrams of Dilaudid.  CTPA ordered due to elevated d-dimer and concern for pulmonary embolism.   [WF]  1451 Patient pain is unchanged prior to CT scanning.  Will discuss patient with [place] [facility] and seek admission.   [WF]    Clinical Course User Index [WF] Tedd Sias, [place]    Patient admitted by Dr. Roslynn Amble.      Final Clinical Impressions(s) / ED Diagnoses   Final diagnoses:  None    ED Discharge Orders    None       Tedd Sias, [place] 12/28/18 1615    Lucrezia Starch, MD 12/29/18 1057

## 2018-12-28 NOTE — ED Triage Notes (Signed)
Pt presents with c/o sickle cell pain and chest pain. Pt reports the pain has been present for approx 2 weeks. Pt also reports a cough for a week but no cough present or heard on assessment. Pt is afebrile.

## 2018-12-28 NOTE — ED Notes (Signed)
Patient transported to X-ray 

## 2018-12-28 NOTE — H&P (Signed)
H&P  Patient Demographics:  Romani Wilbon, is a 43 y.o. male  MRN: [id number]   DOB - 06-01-1975  Admit Date - 12/28/2018  Outpatient Primary MD for the patient is Nolene Ebbs, MD  Chief Complaint  Patient presents with  . Sickle Cell Pain Crisis  . Chest Pain      HPI:   Tarquin Welcher  is a 43 y.o. male with a medical history significant for sickle cell disease, chronic pain syndrome, avascular necrosis of left hip, s/p right hip replacement, opiate dependence, and tobacco dependence presented to the ER complaining of chest pain over the last several weeks.  Patient states that chest pain is consistent with previous sickle cell crisis.  Patient also endorses shortness of breath.  Patient also has a history of chronic pain and acute chest syndrome.  Patient states that he presented to ER because pain felt similar to previous acute chest syndrome episode.  Patient states the pain is worse with deep inhalation and has been consistent for 2 weeks and slowly worsening.  He has been taking MS Contin and oxycodone for pain but states that it has not been helping.  He says that he has been avoiding the ER over the last several weeks due to potential exposure to COVID-19.  Pain intensity 8/10 characterized as constant and sharp. Patient denies fever, chills, heart palpitations, dysuria, nausea, vomiting, or diarrhea.  ER course: WBCs 13.8, hemoglobin 9.0, and platelets 292.  Troponin 3, potassium 3.4 and creatinine 0.74.  CT angiogram shows no pulmonary emboli or acute abnormality.  Shows stable changes of COPD and chronic interstitial lung disease.  Stable mild mediastinal and bilateral hilar adenopathy, most likely reactive.  Stable diffuse patchy sclerosis of the bones, compatible with patient's sickle cell disease.  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  Mild atheromatous coronary artery calcifications. Patient afebrile and maintaining oxygen saturation above  90%. Pain persists despite multiple doses of IV Dilaudid, IV Toradol, and IV fluids.  Patient admitted to telemetry for management of pain crisis.   Review of systems:  In addition to the HPI above, patient reports Review of Systems  Constitutional: Negative.  Negative for chills, fever and weight loss.  HENT: Negative.   Eyes: Negative for blurred vision and double vision.  Respiratory: Positive for cough. Negative for sputum production and shortness of breath.   Cardiovascular: Positive for chest pain.  Gastrointestinal: Negative.   Genitourinary: Negative.   Musculoskeletal: Positive for back pain.  Skin: Negative.   Neurological: Negative.   Endo/Heme/Allergies: Negative.   Psychiatric/Behavioral: Negative.     A full 10 point Review of Systems was done, except as stated above, all other Review of Systems were negative.  With Past History of the following :   Past Medical History:  Diagnosis Date  . Avascular necrosis of hip ([place])    bilateral  . Avascular necrosis of hip, left ([place]) 08/27/2011  . Blood transfusion   . Infection of bone, shoulder region ([place])    left shoulder  . Pneumonia   . Sickle cell crisis [facility])       Past Surgical History:  Procedure Laterality Date  . BONE GRAFT HIP ILIAC CREST    . JOINT REPLACEMENT  2006   right total hip arthroplasty  . Orif right hip fracture  1995     Social History:   Social History   Tobacco Use  . Smoking status: Current Some Day Smoker    Packs/day:  0.50    Types: Cigarettes    Last attempt to quit: 01/27/2012    Years since quitting: 6.9  . Smokeless tobacco: Never Used  Substance Use Topics  . Alcohol use: No     Lives - At home   Family History :   Family History  Adopted: Yes     Home Medications:   Prior to Admission medications   Medication Sig Start Date End Date Taking? Authorizing Provider  cyclobenzaprine (FLEXERIL) 10 MG tablet Take 10 mg by mouth at bedtime.   Yes [provider]  folic acid (FOLVITE) 1 MG tablet Take 1 mg by mouth every evening.    Yes [provider]  hydroxyurea (HYDREA) 500 MG capsule Take 500 mg by mouth 2 (two) times daily. May take with food to minimize GI side effects.   Yes [provider]  ibuprofen (ADVIL,MOTRIN) 800 MG tablet Take 800 mg by mouth every 8 (eight) hours as needed for moderate pain.  09/07/16  Yes [provider]  morphine (MS CONTIN) 60 MG 12 hr tablet Take 60 mg by mouth 2 (two) times daily. 12/29/16  Yes [provider]  morphine (MSIR) 30 MG tablet Take 1 tablet (30 mg total) by mouth daily as needed for severe pain (pain). Resume after completing dilaudid. 02/17/16  Yes Leana Gamer, MD  MOVANTIK 25 MG TABS tablet Take 25 mg by mouth daily. 12/17/17  Yes [provider]  NARCAN 4 MG/0.1ML LIQD nasal spray kit Place 4 mg into the nose as needed (overdose).  12/16/17  Yes [provider]  promethazine (PHENERGAN) 25 MG tablet Take 25 mg by mouth every 6 (six) hours as needed for nausea (nausea).    Yes [provider]     Allergies:   No Known Allergies   Physical Exam:   Vitals:   Vitals:   12/28/18 1630 12/28/18 1659  BP: 116/76 132/84  Pulse: 69 72  Resp: 12 16  Temp:  98 F (36.7 C)  SpO2: 94% 98%   Physical Exam Constitutional:      Appearance: He is well-developed. He is not ill-appearing.  HENT:     Mouth/Throat:     Mouth: Mucous membranes are moist.  Eyes:     Pupils: Pupils are equal, round, and reactive to light.  Cardiovascular:     Rate and Rhythm: Normal rate and regular rhythm.     Heart sounds: Normal heart sounds.  Pulmonary:     Effort: Pulmonary effort is normal.     Breath sounds: Examination of the right-lower field reveals decreased breath sounds. Examination of the left-lower field reveals decreased breath sounds. Decreased breath sounds present.  Abdominal:     General: Bowel sounds are normal.   Musculoskeletal: Normal range of motion.  Skin:    General: Skin is warm.  Neurological:     General: No focal deficit present.     Mental Status: He is alert and oriented to person, place, and time.  Psychiatric:        Mood and Affect: Mood normal.        Behavior: Behavior normal.      Data Review:   CBC Recent Labs  Lab 12/28/18 0930  WBC 13.8*  HGB 9.0*  HCT 25.1*  PLT 292  MCV 91.9  MCH 33.0  MCHC 35.9  RDW 13.5  LYMPHSABS 3.4  MONOABS 1.6*  EOSABS 0.9*  BASOSABS 0.1   ------------------------------------------------------------------------------------------------------------------  Chemistries  Recent Labs  Lab 12/28/18 0855  NA 138  K 3.4*  CL 102  CO2 25  GLUCOSE 75  BUN 15  CREATININE 0.74  CALCIUM 9.3   ------------------------------------------------------------------------------------------------------------------ estimated creatinine clearance is 129.8 mL/min (by C-G formula based on SCr of 0.74 mg/dL). ------------------------------------------------------------------------------------------------------------------ No results for input(s): TSH, T4TOTAL, T3FREE, THYROIDAB in the last 72 hours.  Invalid input(s): FREET3  Coagulation profile No results for input(s): INR, PROTIME in the last 168 hours. ------------------------------------------------------------------------------------------------------------------- Recent Labs    12/28/18 1141  DDIMER 2.56*   -------------------------------------------------------------------------------------------------------------------  Cardiac Enzymes No results for input(s): CKMB, TROPONINI, MYOGLOBIN in the last 168 hours.  Invalid input(s): CK ------------------------------------------------------------------------------------------------------------------ No results found for:  BNP  ---------------------------------------------------------------------------------------------------------------  Urinalysis    Component Value Date/Time   COLORURINE YELLOW 12/28/2018 1831   APPEARANCEUR CLEAR 12/28/2018 1831   LABSPEC >1.046 (H) 12/28/2018 1831   PHURINE 5.0 12/28/2018 1831   GLUCOSEU NEGATIVE 12/28/2018 1831   HGBUR NEGATIVE 12/28/2018 1831   BILIRUBINUR NEGATIVE 12/28/2018 1831   KETONESUR NEGATIVE 12/28/2018 1831   PROTEINUR NEGATIVE 12/28/2018 1831   UROBILINOGEN 0.2 10/25/2013 0020   NITRITE NEGATIVE 12/28/2018 1831   LEUKOCYTESUR NEGATIVE 12/28/2018 1831    ----------------------------------------------------------------------------------------------------------------   Imaging Results:    Dg Chest 2 View  Result Date: 12/28/2018 CLINICAL DATA:  Chest pain, sickle crisis EXAM: CHEST - 2 VIEW COMPARISON:  04/29/2018 FINDINGS: The heart size and mediastinal contours are within normal limits. No change in somewhat coarse underlying interstitial opacity and scarring. No acute appearing airspace opacity. The visualized skeletal structures are unremarkable. IMPRESSION: No change in somewhat coarse underlying interstitial opacity and scarring. No acute appearing airspace opacity. Electronically Signed   By: Eddie Candle M.D.   On: 12/28/2018 09:13   Ct Angio Chest Pe W And/or Wo Contrast  Result Date: 12/28/2018 CLINICAL DATA:  Chest pain for the past 2 weeks. Sickle cell disease. Cough for the past week. Fever. Elevated D-dimer. EXAM: CT ANGIOGRAPHY CHEST WITH CONTRAST TECHNIQUE: Multidetector CT imaging of the chest was performed using the standard protocol during bolus administration of intravenous contrast. Multiplanar CT image reconstructions and MIPs were obtained to evaluate the vascular anatomy. CONTRAST:  18m OMNIPAQUE IOHEXOL 350 MG/ML SOLN COMPARISON:  01/20/2017. FINDINGS: Cardiovascular: The heart remains mildly enlarged. The ascending thoracic aorta  remains mildly dilated, continuing measure 3.9 cm in maximum diameter. Mild coronary artery calcifications are again noted. Normally opacified pulmonary arteries with no pulmonary arterial filling defects seen. Mediastinum/Nodes: No significant change in mildly prominent mediastinal and bilateral hilar lymph nodes. The largest is a right subcarinal node with a short axis diameter of 1.3 cm on image number 150 series 5, previously 1.2 cm. Lungs/Pleura: No significant change in multiple bilateral paraseptal bullae and mild prominence of the interstitial markings in the dependent portions of both lungs. Previously demonstrated areas of airspace consolidation in nodularity are no longer seen. No pleural fluid. Upper Abdomen: Unremarkable. Musculoskeletal: Diffuse patchy sclerosis of the bones has not changed significantly. 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 Review of the MIP images confirms the above findings. IMPRESSION: 1. No pulmonary emboli or acute abnormality. 2. Stable changes of COPD and chronic interstitial lung disease. 3. Stable mild mediastinal and bilateral hilar adenopathy, most likely reactive. 4. Stable diffuse patchy sclerosis of the bones, compatible with the patient's known sickle cell disease. 5. 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 6.  Mild atheromatous coronary artery calcifications. 7. Stable ectasia of the ascending thoracic aorta with a maximum diameter of 3.9 cm. Emphysema (ICD10-J43.9). Electronically Signed   By: Claudie Revering M.D.   On: 12/28/2018 15:12     Assessment & Plan:  Principal Problem:   Sickle cell pain crisis ([place]) Active Problems:   Anemia of chronic disease   Hypokalemia   History of tobacco abuse   Chronic pain syndrome  Sickle cell disease with pain crisis: Pain persists despite several rounds of IV  Dilaudid. Admit, start IVF, 0.45% saline at 100 mL/h Weight-based Dilaudid PCA started within 30 minutes of admission.  Settings of 0.6 mg, 10-minute lockout, and 4 mg/h. IV Toradol 15 mg every 6 hours Monitor vital signs very closely. Reevaluate pain intensity in context of functioning and relationship to baseline as his care progresses. 2 L of supplemental oxygen as needed.  Maintain oxygen saturation above 90%  Atypical chest pain: Suspected to be related to sickle cell pain crisis.  Chest x-ray shows no acute cardiopulmonary process.  CT angiogram shows no pulmonary emboli. EKG NSR Troponins negative Continue to monitor closely, telemetry for 24 hours  Sickle cell disease: Hemoglobin 9.0, consistent with patient's baseline.  No clinical indication for blood transfusion at this time.  Continue to monitor closely.  CBC in a.m.  Leukocytosis: WBCs 13.8.  Patient afebrile.  No signs of infection or inflammation.  No antibiotics warranted at this time.  Continue to monitor closely. CBC in a.m.  Chronic pain syndrome: Continue MS Contin every 12 hours Hold oxycodone, use PCA Dilaudid as substitute.  Tobacco dependence: Patient counseled at length about smoking cessation.  He is not ready to quit at this time.  Offered nicotine patch, patient declined.  Hypokalemia: Potassium 3.4, repeat BMP in a.m. No repletion at this time.  1. Hb Sickle Cell Disease with crisis: Admit, start IVF D5 .45% Saline @ 125 mls/hour, Weight based Dilaudid PCA started within 30 minutes of admission, IV Toradol 30 mg Q 6 H, Monitor vitals very closely, Re-evaluate pain scale regularly, 2 L of Oxygen by [place], Patient will be re-evaluated for pain in the context of function and relationship to baseline as care progresses. 2. Leukocytosis:  3. Sickle Cell Anemia:  4. Chronic pain Syndrome:  5. Medication non-compliance  DVT Prophylaxis: Subcut Lovenox   AM Labs Ordered, also please review Full  Orders  Family Communication: Admission, patient's condition and plan of care including tests being ordered have been discussed with the patient who indicate understanding and agree with the plan and Code Status.  Code Status: Full Code  Consults called: None    Admission status: Inpatient    Time spent in minutes : 50 minutes  [place], MSN, FNP-C [facility] [address] [place], [place] [address] [phone]  12/28/2018 at 8:29 PM

## 2018-12-28 NOTE — ED Notes (Signed)
CBC recollected, per lab request

## 2018-12-28 NOTE — ED Notes (Signed)
ED TO INPATIENT HANDOFF REPORT  Name/Age/Gender Cathe Mons 43 y.o. male  Code Status    Code Status Orders  (From admission, onward)         Start     Ordered   12/28/18 1604  Full code  Continuous     12/28/18 1603        Code Status History    Date Active Date Inactive Code Status Order ID Comments User Context   04/29/2018 1637 05/01/2018 1548 Full Code [id number]  Dorena Dew, [place] Inpatient   01/22/2018 1437 01/25/2018 1515 Full Code [id number]  Elwyn Reach, MD ED   01/20/2017 1833 01/23/2017 1601 Full Code [id number]  Robbie Lis, MD Inpatient   09/17/2016 1536 09/21/2016 1513 Full Code [id number]  Leana Gamer, MD Inpatient   08/13/2016 1700 08/17/2016 1901 Full Code [id number]  Debbe Odea, MD ED   04/21/2016 2057 04/24/2016 1632 Full Code [id number]  Janece Canterbury, MD Inpatient   03/22/2016 1412 03/26/2016 1747 Full Code [id number]  Elwyn Reach, MD Inpatient   02/11/2016 1654 02/17/2016 1610 Full Code [id number]  Leana Gamer, MD Inpatient   01/02/2016 1446 01/07/2016 1705 Full Code [id number]  Leana Gamer, MD ED   12/05/2015 1802 12/10/2015 1808 Full Code [id number]  Mariel Aloe, MD Inpatient   10/16/2015 1745 10/20/2015 1406 Full Code [id number]  Leana Gamer, MD Inpatient   07/14/2015 1315 07/19/2015 1815 Full Code [id number]  Elwyn Reach, MD ED   05/25/2015 1459 05/29/2015 1917 Full Code [id number]  Elwyn Reach, MD Inpatient   03/19/2015 0140 03/22/2015 1936 Full Code [id number]  Toy Baker, MD Inpatient   02/08/2015 2313 02/13/2015 1415 Full Code [id number]  Toy Baker, MD Inpatient   01/30/2015 0243 02/02/2015 1324 Full Code [id number]  Rise Patience, MD Inpatient   10/12/2014 1835 10/15/2014 1348 Full Code [id number]  Janece Canterbury, MD Inpatient   05/31/2014 1134 06/04/2014 1528 Full Code [id number]  Nita Sells, MD ED   05/06/2014 0117 05/09/2014 2038 Full Code [id number]  Toy Baker, MD  Inpatient   02/06/2014 2105 02/09/2014 1926 Full Code [id number]  Allyne Gee, MD Inpatient   01/25/2014 2101 01/27/2014 1640 Full Code [id number]  Etta Quill, DO ED   10/25/2013 0050 10/27/2013 1900 Full Code [id number]  Theressa Millard, MD Inpatient   05/25/2013 0206 05/28/2013 1512 Full Code [id number]  Kelvin Cellar, MD Inpatient   10/13/2012 1735 10/21/2012 1455 Full Code [id number]  Monika Salk, MD Inpatient   11/08/2011 0230 11/20/2011 1807 Full Code [id number]  Theressa Millard, MD ED   08/21/2011 0619 08/27/2011 1838 Full Code [id number]  Marion Downer, RN Inpatient   05/13/2011 0743 05/22/2011 1707 Full Code [id number]  McVey, Greer Ee, RN ED   Advance Care Planning Activity      Home/SNF/Other Home  Chief Complaint sickle cell / chest pain   Level of Care/Admitting Diagnosis ED Disposition    ED Disposition Condition [facility] Area: [place] [[id number]]  Level of Care: Telemetry [5]  Admit to tele based on following criteria: Complex arrhythmia (Bradycardia/Tachycardia)  Covid Evaluation: Asymptomatic Screening Protocol (No Symptoms)  Diagnosis: Sickle cell pain crisis [facility]) [[id number]]  Admitting Physician: Tresa Garter [[id number]]  Attending Physician: Tresa Garter [[id number]]  Estimated length of stay: past midnight tomorrow  Certification:: I certify this patient will need inpatient services for at least 2 midnights  PT Class (Do Not Modify):  Inpatient [101]  PT Acc Code (Do Not Modify): Private [1]       Medical History Past Medical History:  Diagnosis Date  . Avascular necrosis of hip (HCC)    bilateral  . Avascular necrosis of hip, left (HCC) 08/27/2011  . Blood transfusion   . Infection of bone, shoulder region (HCC)    left shoulder  . Pneumonia   . Sickle cell crisis (HCC)     Allergies No Known Allergies  IV Location/Drains/Wounds Patient Lines/Drains/Airways Status   Active Line/Drains/Airways     Name:   Placement date:   Placement time:   Site:   Days:   Peripheral IV 12/28/18 Left;Upper Arm   12/28/18    0931    Arm   less than 1          Labs/Imaging Results for orders placed or performed during the hospital encounter of 12/28/18 (from the past 48 hour(s))  Basic metabolic panel     Status: Abnormal   Collection Time: 12/28/18  8:55 AM  Result Value Ref Range   Sodium 138 135 - 145 mmol/L   Potassium 3.4 (L) 3.5 - 5.1 mmol/L   Chloride 102 98 - 111 mmol/L   CO2 25 22 - 32 mmol/L   Glucose, Bld 75 70 - 99 mg/dL   BUN 15 6 - 20 mg/dL   Creatinine, Ser 3.24 0.61 - 1.24 mg/dL   Calcium 9.3 8.9 - 40.1 mg/dL   GFR calc non Af Amer >60 >60 mL/min   GFR calc Af Amer >60 >60 mL/min   Anion gap 11 5 - 15    Comment: Performed at [facility], [address] [place], [place] [address]  Troponin I (High Sensitivity)     Status: None   Collection Time: 12/28/18  8:55 AM  Result Value Ref Range   Troponin I (High Sensitivity) 3 <18 ng/L    Comment: (NOTE) Elevated high sensitivity troponin I (hsTnI) values and significant  changes across serial measurements may suggest ACS but many other  chronic and acute conditions are known to elevate hsTnI results.  Refer to the "Links" section for chest pain algorithms and additional  guidance. Performed at [facility], [address], [place], [place] [address]   Troponin I (High Sensitivity)     Status: None   Collection Time: 12/28/18  9:30 AM  Result Value Ref Range   Troponin I (High Sensitivity) 2 <18 ng/L    Comment: (NOTE) Elevated high sensitivity troponin I (hsTnI) values and significant  changes across serial measurements may suggest ACS but many other  chronic and acute conditions are known to elevate hsTnI results.  Refer to the "Links" section for chest pain algorithms and additional  guidance. Performed at [facility], [address], [place], [place]  [address]   CBC     Status: Abnormal   Collection Time: 12/28/18  9:30 AM  Result Value Ref Range   WBC 13.8 (H) 4.0 - 10.5 K/uL   RBC 2.73 (L) 4.22 - 5.81 MIL/uL   Hemoglobin 9.0 (L) 13.0 - 17.0 g/dL   HCT 25.9 (L) 56.3 - 87.5 %   MCV 91.9 80.0 - 100.0 fL   MCH 33.0 26.0 - 34.0 pg   MCHC 35.9 30.0 - 36.0 g/dL   RDW 64.3 32.9 - 51.8 %   Platelets 292 150 - 400 K/uL   nRBC 0.1 0.0 - 0.2 %    Comment: Performed at  [facility], [address] [place], [place] [address]  Reticulocytes     Status: Abnormal   Collection Time: 12/28/18  9:30 AM  Result Value Ref Range   Retic Ct Pct 2.2 0.4 - 3.1 %   RBC. 2.73 (L) 4.22 - 5.81 MIL/uL   Retic Count, Absolute 59.8 19.0 - 186.0 K/uL   Immature Retic Fract 28.5 (H) 2.3 - 15.9 %    Comment: Performed at [facility], [address] [place], [place] [address]  Differential     Status: Abnormal   Collection Time: 12/28/18  9:30 AM  Result Value Ref Range   Neutrophils Relative % 56 %   Neutro Abs 7.5 1.7 - 7.7 K/uL   Lymphocytes Relative 25 %   Lymphs Abs 3.4 0.7 - 4.0 K/uL   Monocytes Relative 12 %   Monocytes Absolute 1.6 (H) 0.1 - 1.0 K/uL   Eosinophils Relative 6 %   Eosinophils Absolute 0.9 (H) 0.0 - 0.5 K/uL   Basophils Relative 1 %   Basophils Absolute 0.1 0.0 - 0.1 K/uL    Comment: Performed at [facility], [address] [place], [place] [address]  D-dimer, quantitative (not at [facility])     Status: Abnormal   Collection Time: 12/28/18 11:41 AM  Result Value Ref Range   D-Dimer, Quant 2.56 (H) 0.00 - 0.50 ug/mL-FEU    Comment: (NOTE) At the manufacturer cut-off of 0.50 ug/mL FEU, this assay has been documented to exclude PE with a sensitivity and negative predictive value of 97 to 99%.  At this time, this assay has not been approved by the FDA to exclude DVT/VTE. Results should be correlated with clinical presentation. Performed at [facility], [address] [place], [place] [address]   SARS Coronavirus 2 [facility](Hospital order, Performed in [facility] hospital lab) Nasopharyngeal Nasopharyngeal Swab     Status: None   Collection Time: 12/28/18 11:47 AM   Specimen: Nasopharyngeal Swab  Result Value Ref Range   SARS Coronavirus 2 NEGATIVE NEGATIVE    Comment: (NOTE) If result is NEGATIVE SARS-CoV-2 target nucleic acids are NOT DETECTED. The SARS-CoV-2 RNA is generally detectable in upper and lower  respiratory specimens during the acute phase of infection. The lowest  concentration of SARS-CoV-2 viral copies this assay can detect is 250  copies / mL. A negative result does not preclude SARS-CoV-2 infection  and should not be used as the sole basis for treatment or other  patient management decisions.  A negative result may occur with  improper specimen collection / handling, submission of specimen other  than nasopharyngeal swab, presence of viral mutation(s) within the  areas targeted by this assay, and inadequate number of viral copies  (<250 copies / mL). A negative result must be combined with clinical  observations, patient history, and epidemiological information. If result is POSITIVE SARS-CoV-2 target nucleic acids are DETECTED. The SARS-CoV-2 RNA is generally detectable in upper and lower  respiratory specimens dur ing the acute phase of infection.  Positive  results are indicative of active infection with SARS-CoV-2.  Clinical  correlation with patient history and other diagnostic information is  necessary to determine patient infection status.  Positive results do  not rule out bacterial infection or co-infection with other viruses. If result is PRESUMPTIVE POSTIVE SARS-CoV-2 nucleic acids MAY BE PRESENT.   A presumptive positive result was obtained on the submitted specimen  and confirmed on repeat testing.  While 2019 novel  coronavirus  (SARS-CoV-2) nucleic acids may be present in the submitted sample  additional  confirmatory testing may be necessary for epidemiological  and / or clinical management purposes  to differentiate between  SARS-CoV-2 and other Sarbecovirus currently known to infect humans.  If clinically indicated additional testing with an alternate test  methodology [phone] is advised. The SARS-CoV-2 RNA is generally  detectable in upper and lower respiratory sp ecimens during the acute  phase of infection. The expected result is Negative. Fact Sheet for Patients:  BoilerBrush.com.cyhttps://www.fda.gov/media/136312/download Fact Sheet for Healthcare Providers: https://pope.com/https://www.fda.gov/media/136313/download This test is not yet approved or cleared by the [place] FDA and has been authorized for detection and/or diagnosis of SARS-CoV-2 by FDA under an Emergency Use Authorization (EUA).  This EUA will remain in effect (meaning this test can be used) for the duration of the COVID-19 declaration under Section 564(b)(1) of the Act, 21 U.S.C. section 360bbb-3(b)(1), unless the authorization is terminated or revoked sooner. Performed at [facility], [address] [place], [place] [address]    Dg Chest 2 View  Result Date: 12/28/2018 CLINICAL DATA:  Chest pain, sickle crisis EXAM: CHEST - 2 VIEW COMPARISON:  04/29/2018 FINDINGS: The heart size and mediastinal contours are within normal limits. No change in somewhat coarse underlying interstitial opacity and scarring. No acute appearing airspace opacity. The visualized skeletal structures are unremarkable. IMPRESSION: No change in somewhat coarse underlying interstitial opacity and scarring. No acute appearing airspace opacity. Electronically Signed   By: Lauralyn PrimesAlex  Bibbey M.D.   On: 12/28/2018 09:13   Ct Angio Chest Pe W And/or Wo Contrast  Result Date: 12/28/2018 CLINICAL DATA:  Chest pain for the past 2 weeks. Sickle cell disease. Cough for the past week. Fever. Elevated D-dimer. EXAM: CT ANGIOGRAPHY CHEST WITH CONTRAST TECHNIQUE:  Multidetector CT imaging of the chest was performed using the standard protocol during bolus administration of intravenous contrast. Multiplanar CT image reconstructions and MIPs were obtained to evaluate the vascular anatomy. CONTRAST:  100mL OMNIPAQUE IOHEXOL 350 MG/ML SOLN COMPARISON:  01/20/2017. FINDINGS: Cardiovascular: The heart remains mildly enlarged. The ascending thoracic aorta remains mildly dilated, continuing measure 3.9 cm in maximum diameter. Mild coronary artery calcifications are again noted. Normally opacified pulmonary arteries with no pulmonary arterial filling defects seen. Mediastinum/Nodes: No significant change in mildly prominent mediastinal and bilateral hilar lymph nodes. The largest is a right subcarinal node with a short axis diameter of 1.3 cm on image number 150 series 5, previously 1.2 cm. Lungs/Pleura: No significant change in multiple bilateral paraseptal bullae and mild prominence of the interstitial markings in the dependent portions of both lungs. Previously demonstrated areas of airspace consolidation in nodularity are no longer seen. No pleural fluid. Upper Abdomen: Unremarkable. Musculoskeletal: Diffuse patchy sclerosis of the bones has not changed significantly. 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 Review of the MIP images confirms the above findings. IMPRESSION: 1. No pulmonary emboli or acute abnormality. 2. Stable changes of COPD and chronic interstitial lung disease. 3. Stable mild mediastinal and bilateral hilar adenopathy, most likely reactive. 4. Stable diffuse patchy sclerosis of the bones, compatible with the patient's known sickle cell disease. 5. 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 6. Mild atheromatous coronary artery calcifications. 7. Stable ectasia of the ascending thoracic aorta with a maximum diameter of 3.9  cm. Emphysema (ICD10-J43.9). Electronically Signed   By: Viviann SpareSteven  Azucena Kuba M.D.   On: 12/28/2018 15:12    Pending Labs Unresulted Labs (From admission, onward)    Start     Ordered   12/28/18 1604  Urine culture  Once,   STAT     12/28/18 1603   12/28/18 1604  Urinalysis, Routine w reflex microscopic  Once,   STAT     12/28/18 1603   Signed and Held  CBC  (enoxaparin (LOVENOX)    CrCl >/= 30 ml/min)  Once,   R    Comments: Baseline for enoxaparin therapy IF NOT ALREADY DRAWN.  Notify MD if PLT < 100 K.    Signed and Held   Signed and Held  Creatinine, serum  (enoxaparin (LOVENOX)    CrCl >/= 30 ml/min)  Once,   R    Comments: Baseline for enoxaparin therapy IF NOT ALREADY DRAWN.    Signed and Held   Signed and Held  Creatinine, serum  (enoxaparin (LOVENOX)    CrCl >/= 30 ml/min)  Weekly,   R    Comments: while on enoxaparin therapy    Signed and Held   Signed and Held  CBC with Differential/Platelet  Tomorrow morning,   R     Signed and Held          Vitals/Pain Today's Vitals   12/28/18 1533 12/28/18 1540 12/28/18 1608 12/28/18 1608  BP:   114/75   Pulse:  65 77   Resp: 15 11 20    Temp:      TempSrc:      SpO2:  97% 96%   Weight:      Height:      PainSc:    9     Isolation Precautions No active isolations  Medications Medications  ondansetron (ZOFRAN) injection 4 mg (4 mg Intravenous Given 12/28/18 1035)  folic acid (FOLVITE) tablet 1 mg (has no administration in time range)  ketorolac (TORADOL) 15 MG/ML injection 15 mg (has no administration in time range)  diphenhydrAMINE (BENADRYL) capsule 25 mg (has no administration in time range)    Or  diphenhydrAMINE (BENADRYL) 25 mg in sodium chloride 0.9 % 50 mL IVPB (has no administration in time range)  ketorolac (TORADOL) 15 MG/ML injection 15 mg (15 mg Intravenous Given 12/28/18 1035)  HYDROmorphone (DILAUDID) injection 2 mg (2 mg Intravenous Given 12/28/18 1035)    Or  HYDROmorphone (DILAUDID) injection 2 mg (  Subcutaneous See Alternative 12/28/18 1035)  HYDROmorphone (DILAUDID) injection 2 mg (2 mg Intravenous Given 12/28/18 1139)    Or  HYDROmorphone (DILAUDID) injection 2 mg ( Subcutaneous See Alternative 12/28/18 1139)  diphenhydrAMINE (BENADRYL) injection 25 mg (25 mg Intravenous Given 12/28/18 1035)  sodium chloride 0.45 % bolus 1,000 mL (0 mLs Intravenous Stopped 12/28/18 1142)  HYDROmorphone (DILAUDID) injection 2 mg (2 mg Intravenous Given 12/28/18 1311)  sodium chloride (PF) 0.9 % injection (  Given by Other 12/28/18 1534)  iohexol (OMNIPAQUE) 350 MG/ML injection 100 mL (100 mLs Intravenous Contrast Given 12/28/18 1441)    Mobility walks

## 2018-12-29 LAB — CBC WITH DIFFERENTIAL/PLATELET
Abs Immature Granulocytes: 0.07 10*3/uL (ref 0.00–0.07)
Basophils Absolute: 0.1 10*3/uL (ref 0.0–0.1)
Basophils Relative: 1 %
Eosinophils Absolute: 1 10*3/uL — ABNORMAL HIGH (ref 0.0–0.5)
Eosinophils Relative: 8 %
HCT: 24.3 % — ABNORMAL LOW (ref 39.0–52.0)
Hemoglobin: 8.8 g/dL — ABNORMAL LOW (ref 13.0–17.0)
Immature Granulocytes: 1 %
Lymphocytes Relative: 28 %
Lymphs Abs: 3.7 10*3/uL (ref 0.7–4.0)
MCH: 33.8 pg (ref 26.0–34.0)
MCHC: 36.2 g/dL — ABNORMAL HIGH (ref 30.0–36.0)
MCV: 93.5 fL (ref 80.0–100.0)
Monocytes Absolute: 1.2 10*3/uL — ABNORMAL HIGH (ref 0.1–1.0)
Monocytes Relative: 9 %
Neutro Abs: 7.1 10*3/uL (ref 1.7–7.7)
Neutrophils Relative %: 53 %
Platelets: 252 10*3/uL (ref 150–400)
RBC: 2.6 MIL/uL — ABNORMAL LOW (ref 4.22–5.81)
RDW: 14.1 % (ref 11.5–15.5)
WBC: 13.1 10*3/uL — ABNORMAL HIGH (ref 4.0–10.5)
nRBC: 0 % (ref 0.0–0.2)

## 2018-12-29 LAB — URINE CULTURE: Culture: NO GROWTH

## 2018-12-29 MED ORDER — MORPHINE SULFATE 15 MG PO TABS
15.0000 mg | ORAL_TABLET | ORAL | Status: DC | PRN
  Administered 2018-12-29 – 2018-12-30 (×3): 15 mg via ORAL
  Filled 2018-12-29 (×3): qty 1

## 2018-12-29 MED ORDER — HYDROMORPHONE 1 MG/ML IV SOLN
INTRAVENOUS | Status: DC
  Administered 2018-12-29: 5 mg via INTRAVENOUS
  Administered 2018-12-29: 30 mg via INTRAVENOUS
  Administered 2018-12-30: 11 mg via INTRAVENOUS
  Administered 2018-12-30: 0.5 mg via INTRAVENOUS
  Filled 2018-12-29: qty 30

## 2018-12-29 NOTE — Progress Notes (Signed)
Subjective: [name], 43 year old male with a medical history significant s/p right hip replacement, opiate dependence, tobacco dependence was admitted for sickle cell pain crisis. Patient states that pain intensity has improved minimally overnight.  Pain is localized to left chest and is characterized as nonradiating and occasionally sharp. Pain intensity 8/10.  Patient denies headache, palpitations, dysuria, nausea, vomiting, or diarrhea endorses constipation.  Objective:  Vital signs in last 24 hours:  Vitals:   12/29/18 0359 12/29/18 0456 12/29/18 0722 12/29/18 1023  BP:  130/85  136/83  Pulse:  73  (!) 58  Resp: 14 16 12    Temp:  98.1 F (36.7 C)  98 F (36.7 C)  TempSrc:  Oral  Oral  SpO2: 98% 90% 92% 93%  Weight:      Height:        Intake/Output from previous day:   Intake/Output Summary (Last 24 hours) at 12/29/2018 1101 Last data filed at 12/29/2018 0700 Gross per 24 hour  Intake 2190.6 ml  Output 500 ml  Net 1690.6 ml    Physical Exam: General: Alert, awake, oriented x3, in no acute distress.  HEENT: [place]/AT PEERL, EOMI Neck: Trachea midline,  no masses, no thyromegal,y no JVD, no carotid bruit OROPHARYNX:  Moist, No exudate/ erythema/lesions.  Heart: Regular rate and rhythm, without murmurs, rubs, gallops, PMI non-displaced, no heaves or thrills on palpation.  Lungs: Clear to auscultation, no wheezing or rhonchi noted. No increased vocal fremitus resonant to percussion  Abdomen: Soft, nontender, nondistended, positive bowel sounds, no masses no hepatosplenomegaly noted..  Neuro: No focal neurological deficits noted cranial nerves II through XII grossly intact. DTRs 2+ bilaterally upper and lower extremities. Strength 5 out of 5 in bilateral upper and lower extremities. Musculoskeletal: No warm swelling or erythema around joints, no spinal tenderness noted. Psychiatric: Patient alert and oriented x3, good insight and cognition, good recent to remote recall. Lymph  node survey: No cervical axillary or inguinal lymphadenopathy noted.  Lab Results:  Basic Metabolic Panel:    Component Value Date/Time   NA 138 12/28/2018 0855   K 3.4 (L) 12/28/2018 0855   CL 102 12/28/2018 0855   CO2 25 12/28/2018 0855   BUN 15 12/28/2018 0855   CREATININE 0.74 12/28/2018 0855   GLUCOSE 75 12/28/2018 0855   CALCIUM 9.3 12/28/2018 0855   CBC:    Component Value Date/Time   WBC 13.1 (H) 12/29/2018 0407   HGB 8.8 (L) 12/29/2018 0407   HCT 24.3 (L) 12/29/2018 0407   PLT 252 12/29/2018 0407   MCV 93.5 12/29/2018 0407   NEUTROABS 7.1 12/29/2018 0407   LYMPHSABS 3.7 12/29/2018 0407   MONOABS 1.2 (H) 12/29/2018 0407   EOSABS 1.0 (H) 12/29/2018 0407   BASOSABS 0.1 12/29/2018 0407    Recent Results (from the past 240 hour(s))  SARS Coronavirus 2 [facility] order, Performed in [facility] hospital lab) Nasopharyngeal Nasopharyngeal Swab     Status: None   Collection Time: 12/28/18 11:47 AM   Specimen: Nasopharyngeal Swab  Result Value Ref Range Status   SARS Coronavirus 2 NEGATIVE NEGATIVE Final    Comment: (NOTE) If result is NEGATIVE SARS-CoV-2 target nucleic acids are NOT DETECTED. The SARS-CoV-2 RNA is generally detectable in upper and lower  respiratory specimens during the acute phase of infection. The lowest  concentration of SARS-CoV-2 viral copies this assay can detect is 250  copies / mL. A negative result does not preclude SARS-CoV-2 infection  and should not be used as the sole basis for  treatment or other  patient management decisions.  A negative result may occur with  improper specimen collection / handling, submission of specimen other  than nasopharyngeal swab, presence of viral mutation(s) within the  areas targeted by this assay, and inadequate number of viral copies  (<250 copies / mL). A negative result must be combined with clinical  observations, patient history, and epidemiological information. If result is POSITIVE SARS-CoV-2 target  nucleic acids are DETECTED. The SARS-CoV-2 RNA is generally detectable in upper and lower  respiratory specimens dur ing the acute phase of infection.  Positive  results are indicative of active infection with SARS-CoV-2.  Clinical  correlation with patient history and other diagnostic information is  necessary to determine patient infection status.  Positive results do  not rule out bacterial infection or co-infection with other viruses. If result is PRESUMPTIVE POSTIVE SARS-CoV-2 nucleic acids MAY BE PRESENT.   A presumptive positive result was obtained on the submitted specimen  and confirmed on repeat testing.  While 2019 novel coronavirus  (SARS-CoV-2) nucleic acids may be present in the submitted sample  additional confirmatory testing may be necessary for epidemiological  and / or clinical management purposes  to differentiate between  SARS-CoV-2 and other Sarbecovirus currently known to infect humans.  If clinically indicated additional testing with an alternate test  methodology [phone] is advised. The SARS-CoV-2 RNA is generally  detectable in upper and lower respiratory sp ecimens during the acute  phase of infection. The expected result is Negative. Fact Sheet for Patients:  BoilerBrush.com.cyhttps://www.fda.gov/media/136312/download Fact Sheet for Healthcare Providers: https://pope.com/https://www.fda.gov/media/136313/download This test is not yet approved or cleared by the [place] FDA and has been authorized for detection and/or diagnosis of SARS-CoV-2 by FDA under an Emergency Use Authorization (EUA).  This EUA will remain in effect (meaning this test can be used) for the duration of the COVID-19 declaration under Section 564(b)(1) of the Act, 21 U.S.C. section 360bbb-3(b)(1), unless the authorization is terminated or revoked sooner. Performed at [facility], [address] [place], [place] [address]     Studies/Results: Dg Chest 2 View  Result Date:  12/28/2018 CLINICAL DATA:  Chest pain, sickle crisis EXAM: CHEST - 2 VIEW COMPARISON:  04/29/2018 FINDINGS: The heart size and mediastinal contours are within normal limits. No change in somewhat coarse underlying interstitial opacity and scarring. No acute appearing airspace opacity. The visualized skeletal structures are unremarkable. IMPRESSION: No change in somewhat coarse underlying interstitial opacity and scarring. No acute appearing airspace opacity. Electronically Signed   By: Lauralyn PrimesAlex  Bibbey M.D.   On: 12/28/2018 09:13   Ct Angio Chest Pe W And/or Wo Contrast  Result Date: 12/28/2018 CLINICAL DATA:  Chest pain for the past 2 weeks. Sickle cell disease. Cough for the past week. Fever. Elevated D-dimer. EXAM: CT ANGIOGRAPHY CHEST WITH CONTRAST TECHNIQUE: Multidetector CT imaging of the chest was performed using the standard protocol during bolus administration of intravenous contrast. Multiplanar CT image reconstructions and MIPs were obtained to evaluate the vascular anatomy. CONTRAST:  100mL OMNIPAQUE IOHEXOL 350 MG/ML SOLN COMPARISON:  01/20/2017. FINDINGS: Cardiovascular: The heart remains mildly enlarged. The ascending thoracic aorta remains mildly dilated, continuing measure 3.9 cm in maximum diameter. Mild coronary artery calcifications are again noted. Normally opacified pulmonary arteries with no pulmonary arterial filling defects seen. Mediastinum/Nodes: No significant change in mildly prominent mediastinal and bilateral hilar lymph nodes. The largest is a right subcarinal node with a short axis diameter of 1.3 cm on image number 150 series 5,  previously 1.2 cm. Lungs/Pleura: No significant change in multiple bilateral paraseptal bullae and mild prominence of the interstitial markings in the dependent portions of both lungs. Previously demonstrated areas of airspace consolidation in nodularity are no longer seen. No pleural fluid. Upper Abdomen: Unremarkable. Musculoskeletal: Diffuse patchy  sclerosis of the bones has not changed significantly. 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 Review of the MIP images confirms the above findings. IMPRESSION: 1. No pulmonary emboli or acute abnormality. 2. Stable changes of COPD and chronic interstitial lung disease. 3. Stable mild mediastinal and bilateral hilar adenopathy, most likely reactive. 4. Stable diffuse patchy sclerosis of the bones, compatible with the patient's known sickle cell disease. 5. 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 6. Mild atheromatous coronary artery calcifications. 7. Stable ectasia of the ascending thoracic aorta with a maximum diameter of 3.9 cm. Emphysema (ICD10-J43.9). Electronically Signed   By: Claudie Revering M.D.   On: 12/28/2018 15:12    Medications: Scheduled Meds: . enoxaparin (LOVENOX) injection  40 mg Subcutaneous Q24H  . folic acid  1 mg Oral QPM  . HYDROmorphone   Intravenous Q4H  . hydroxyurea  500 mg Oral BID  . ketorolac  15 mg Intravenous Q6H  . morphine  60 mg Oral BID  . naloxegol oxalate  25 mg Oral QHS  . senna-docusate  1 tablet Oral BID   Continuous Infusions: . sodium chloride 100 mL/hr at 12/29/18 0145  . diphenhydrAMINE     PRN Meds:.diphenhydrAMINE **OR** diphenhydrAMINE, morphine, naloxone **AND** sodium chloride flush, ondansetron, polyethylene glycol   Assessment/Plan: Principal Problem:   Sickle cell pain crisis (HCC) Active Problems:   Anemia of chronic disease   Hypokalemia   History of tobacco abuse   Chronic pain syndrome   Sickle cell disease with pain crisis: Weaning IV Dilaudid PCA, settings changed to 0.5 mg, 10-minute lockout, and 3 mg continue IV Toradol 15 mg every 6 hours MSIR 15 mg every 4 hours as needed for moderate to severe breakthrough pain monitor vital signs very closely 2 L supplemental oxygen as needed    Atypical chest pain: Improved, consistent with previous sickle cell pain crisis.  Sickle cell anemia: Hemoglobin 8.8, consistent with patient's baseline.  No indication for blood transfusion at this time.    Leukocytosis: Stable.  Patient afebrile.  No signs of infection or inflammation. Monitor closely.  Chronic pain syndrome: Stable.  Continue home medications.  Tobacco dependence: Patient counseled at length on admission.  Declined nicotine patch.     Code Status: Full Code Family Communication: N/A Disposition Plan: Not yet ready for discharge    [place], MSN, FNP-C Patient [facility] [address] [place], [place] [phone]   If 5PM-7AM, please contact night-coverage.  12/29/2018, 11:01 AM  LOS: 1 day

## 2018-12-30 LAB — BASIC METABOLIC PANEL
Anion gap: 10 (ref 5–15)
BUN: 13 mg/dL (ref 6–20)
CO2: 24 mmol/L (ref 22–32)
Calcium: 9.1 mg/dL (ref 8.9–10.3)
Chloride: 104 mmol/L (ref 98–111)
Creatinine, Ser: 0.7 mg/dL (ref 0.61–1.24)
GFR calc Af Amer: >60 mL/min (ref >60)
GFR calc non Af Amer: >60 mL/min (ref >60)
Glucose, Bld: 96 mg/dL (ref 70–99)
Potassium: 4.1 mmol/L (ref 3.5–5.1)
Sodium: 138 mmol/L (ref 135–145)

## 2018-12-30 LAB — CBC
HCT: 23.1 % — ABNORMAL LOW (ref 39.0–52.0)
Hemoglobin: 8.3 g/dL — ABNORMAL LOW (ref 13.0–17.0)
MCH: 33.2 pg (ref 26.0–34.0)
MCHC: 35.9 g/dL (ref 30.0–36.0)
MCV: 92.4 fL (ref 80.0–100.0)
Platelets: 241 10*3/uL (ref 150–400)
RBC: 2.5 MIL/uL — ABNORMAL LOW (ref 4.22–5.81)
RDW: 14.3 % (ref 11.5–15.5)
WBC: 13 10*3/uL — ABNORMAL HIGH (ref 4.0–10.5)
nRBC: 0 % (ref 0.0–0.2)

## 2018-12-30 MED ORDER — HYDROMORPHONE 1 MG/ML IV SOLN
INTRAVENOUS | Status: DC
  Administered 2018-12-30: 9.5 mg via INTRAVENOUS
  Administered 2018-12-30 – 2018-12-31 (×2): 30 mg via INTRAVENOUS
  Administered 2018-12-31: 2 mg via INTRAVENOUS
  Administered 2018-12-31: 4 mg via INTRAVENOUS
  Administered 2019-01-01: 2 mg via INTRAVENOUS
  Administered 2019-01-01: 0 mg via INTRAVENOUS
  Administered 2019-01-01: 6 mg via INTRAVENOUS
  Filled 2018-12-30 (×2): qty 30

## 2018-12-30 MED ORDER — HYDROMORPHONE HCL 1 MG/ML IJ SOLN
2.0000 mg | Freq: Once | INTRAMUSCULAR | Status: AC
  Administered 2018-12-30: 2 mg via SUBCUTANEOUS
  Filled 2018-12-30: qty 2

## 2018-12-30 NOTE — Progress Notes (Signed)
Subjective: [name], a 43 year old male with a medical history significant for sickle cell disease, chronic pain syndrome, opiate dependence, and anemia of chronic disease was admitted for sickle cell pain crisis.  Patient says that pain has improved minimally despite IV dilaudid PCA. Patient's IV was occluded overnight, pain was not controlled. Pain intensity is 7/10 primarily to left chest, back, and lower extremities.   Patient denies shortness of breath, dizziness, paresthesias, dysuria, nausea, vomiting, diarrhea, or constipation.   Objective:  Vital signs in last 24 hours:  Vitals:   12/31/18 0006 12/31/18 0400 12/31/18 0441 12/31/18 0531  BP: 127/82  (!) 137/93   Pulse: 69  72   Resp: 15 11 13 11   Temp: 98.3 F (36.8 C)  98.2 F (36.8 C)   TempSrc: Oral  Oral   SpO2: 93% 96% 94% 96%  Weight:      Height:        Intake/Output from previous day:   Intake/Output Summary (Last 24 hours) at 12/31/2018 0727 Last data filed at 12/31/2018 0544 Gross per 24 hour  Intake 962.4 ml  Output 1780 ml  Net -817.6 ml    Physical Exam: General: Alert, awake, oriented x3, in no acute distress.  HEENT: [place]/AT PEERL, EOMI Neck: Trachea midline,  no masses, no thyromegal,y no JVD, no carotid bruit OROPHARYNX:  Moist, No exudate/ erythema/lesions.  Heart: Regular rate and rhythm, without murmurs, rubs, gallops, PMI non-displaced, no heaves or thrills on palpation.  Lungs: Clear to auscultation, no wheezing or rhonchi noted. No increased vocal fremitus resonant to percussion  Abdomen: Soft, nontender, nondistended, positive bowel sounds, no masses no hepatosplenomegaly noted..  Neuro: No focal neurological deficits noted cranial nerves II through XII grossly intact. DTRs 2+ bilaterally upper and lower extremities. Strength 5 out of 5 in bilateral upper and lower extremities. Musculoskeletal: No warm swelling or erythema around joints, no spinal tenderness noted. Psychiatric: Patient alert  and oriented x3, good insight and cognition, good recent to remote recall. Lymph node survey: No cervical axillary or inguinal lymphadenopathy noted.  Lab Results:  Basic Metabolic Panel:    Component Value Date/Time   NA 138 12/30/2018 0355   K 4.1 12/30/2018 0355   CL 104 12/30/2018 0355   CO2 24 12/30/2018 0355   BUN 13 12/30/2018 0355   CREATININE 0.70 12/30/2018 0355   GLUCOSE 96 12/30/2018 0355   CALCIUM 9.1 12/30/2018 0355   CBC:    Component Value Date/Time   WBC 13.0 (H) 12/30/2018 0355   HGB 8.3 (L) 12/30/2018 0355   HCT 23.1 (L) 12/30/2018 0355   PLT 241 12/30/2018 0355   MCV 92.4 12/30/2018 0355   NEUTROABS 7.1 12/29/2018 0407   LYMPHSABS 3.7 12/29/2018 0407   MONOABS 1.2 (H) 12/29/2018 0407   EOSABS 1.0 (H) 12/29/2018 0407   BASOSABS 0.1 12/29/2018 0407    Recent Results (from the past 240 hour(s))  SARS Coronavirus 2 [facility] order, Performed in [facility] hospital lab) Nasopharyngeal Nasopharyngeal Swab     Status: None   Collection Time: 12/28/18 11:47 AM   Specimen: Nasopharyngeal Swab  Result Value Ref Range Status   SARS Coronavirus 2 NEGATIVE NEGATIVE Final    Comment: (NOTE) If result is NEGATIVE SARS-CoV-2 target nucleic acids are NOT DETECTED. The SARS-CoV-2 RNA is generally detectable in upper and lower  respiratory specimens during the acute phase of infection. The lowest  concentration of SARS-CoV-2 viral copies this assay can detect is 250  copies / mL. A  negative result does not preclude SARS-CoV-2 infection  and should not be used as the sole basis for treatment or other  patient management decisions.  A negative result may occur with  improper specimen collection / handling, submission of specimen other  than nasopharyngeal swab, presence of viral mutation(s) within the  areas targeted by this assay, and inadequate number of viral copies  (<250 copies / mL). A negative result must be combined with clinical  observations, patient  history, and epidemiological information. If result is POSITIVE SARS-CoV-2 target nucleic acids are DETECTED. The SARS-CoV-2 RNA is generally detectable in upper and lower  respiratory specimens dur ing the acute phase of infection.  Positive  results are indicative of active infection with SARS-CoV-2.  Clinical  correlation with patient history and other diagnostic information is  necessary to determine patient infection status.  Positive results do  not rule out bacterial infection or co-infection with other viruses. If result is PRESUMPTIVE POSTIVE SARS-CoV-2 nucleic acids MAY BE PRESENT.   A presumptive positive result was obtained on the submitted specimen  and confirmed on repeat testing.  While 2019 novel coronavirus  (SARS-CoV-2) nucleic acids may be present in the submitted sample  additional confirmatory testing may be necessary for epidemiological  and / or clinical management purposes  to differentiate between  SARS-CoV-2 and other Sarbecovirus currently known to infect humans.  If clinically indicated additional testing with an alternate test  methodology [phone]([id number]) is advised. The SARS-CoV-2 RNA is generally  detectable in upper and lower respiratory sp ecimens during the acute  phase of infection. The expected result is Negative. Fact Sheet for Patients:  BoilerBrush.com.cyhttps://www.fda.gov/media/136312/download Fact Sheet for Healthcare Providers: https://pope.com/https://www.fda.gov/media/136313/download This test is not yet approved or cleared by the [place] FDA and has been authorized for detection and/or diagnosis of SARS-CoV-2 by FDA under an Emergency Use Authorization (EUA).  This EUA will remain in effect (meaning this test can be used) for the duration of the COVID-19 declaration under Section 564(b)(1) of the Act, 21 U.S.C. section 360bbb-3(b)(1), unless the authorization is terminated or revoked sooner. Performed at [facility], [address] [place],  [place] [address]   Urine culture     Status: None   Collection Time: 12/28/18  6:31 PM   Specimen: Urine, Clean Catch  Result Value Ref Range Status   Specimen Description   Final    URINE, CLEAN CATCH Performed at [facility], [address] [place], [place] [address]    Special Requests   Final    NONE Performed at [facility], [address] [place], [place] [address]    Culture   Final    NO GROWTH Performed at [facility], [address] [place], [place] [address]    Report Status 12/29/2018 FINAL  Final    Studies/Results: No results found.  Medications: Scheduled Meds: . enoxaparin (LOVENOX) injection  40 mg Subcutaneous Q24H  . folic acid  1 mg Oral QPM  . HYDROmorphone   Intravenous Q4H  . hydroxyurea  500 mg Oral BID  . ketorolac  15 mg Intravenous Q6H  . morphine  60 mg Oral BID  . naloxegol oxalate  25 mg Oral QHS  . senna-docusate  1 tablet Oral BID   Continuous Infusions: . sodium chloride 10 mL/hr at 12/30/18 2159  . diphenhydrAMINE     PRN Meds:.diphenhydrAMINE **OR** diphenhydrAMINE, morphine, naloxone **AND** sodium chloride flush, ondansetron, polyethylene glycol   Assessment/Plan: Principal Problem:  Sickle cell pain crisis ([place]) Active Problems:   Anemia of chronic disease   Hypokalemia   History of tobacco abuse   Chronic pain syndrome  Sickle cell disease with pain crisis:  Weaning IV dilaudid PCA MSIR 15 mg every 4 hours for moderate to severe breakthrough pain Continue IV fluids at [facility] Toradol 15 mg every 6 hours 2 L supplemental oxygen as needed  Atypical chest pain:  Improved. No further workup and evaluation warranted at this juncture.   Sickle cell anemia:  Stable. Hemoglobin consistent with patient's baseline. No indication for blood transfusion at this time.   Leukocytosis:  Stable. Patient afebrile. No signs of infection or inflammation.  CBC in am  Chronic pain syndrome:   Continue home medications  Tobacco dependence:  Counseled at length on admission. Patient declined nicotine patch.    Code Status: Full Code Family Communication: N/A Disposition Plan: Not yet ready for discharge  [place], MSN, FNP-C Patient [facility] [address] [place], [place] [address] [phone]  If 5PM-7AM, please contact night-coverage.  12/31/2018, 7:27 AM  LOS: 3 days

## 2018-12-30 NOTE — Progress Notes (Signed)
Patient assessed this am. IV site CDI, beeping occluded. Attempted to flush site and unable. IV team to consult. Prior site inserted ultrasound.

## 2018-12-30 NOTE — Care Management Important Message (Signed)
Important Message  Patient Details IM Letter given to Cookie McGibboney RN to present to the Patient Name: [name] MRN: [id number] Date of Birth: [birth year]   Medicare Important Message Given:  Yes     Kerin Salen 12/30/2018, 11:08 AM

## 2018-12-31 DIAGNOSIS — D638 Anemia in other chronic diseases classified elsewhere: Secondary | ICD-10-CM

## 2018-12-31 DIAGNOSIS — G894 Chronic pain syndrome: Secondary | ICD-10-CM

## 2018-12-31 DIAGNOSIS — Z87891 Personal history of nicotine dependence: Secondary | ICD-10-CM

## 2018-12-31 DIAGNOSIS — E876 Hypokalemia: Secondary | ICD-10-CM

## 2018-12-31 LAB — CBC WITH DIFFERENTIAL/PLATELET
Abs Immature Granulocytes: 0.08 10*3/uL — ABNORMAL HIGH (ref 0.00–0.07)
Basophils Absolute: 0.1 10*3/uL (ref 0.0–0.1)
Basophils Relative: 1 %
Eosinophils Absolute: 0.9 10*3/uL — ABNORMAL HIGH (ref 0.0–0.5)
Eosinophils Relative: 8 %
HCT: 23.8 % — ABNORMAL LOW (ref 39.0–52.0)
Hemoglobin: 8.4 g/dL — ABNORMAL LOW (ref 13.0–17.0)
Immature Granulocytes: 1 %
Lymphocytes Relative: 26 %
Lymphs Abs: 2.9 10*3/uL (ref 0.7–4.0)
MCH: 32.7 pg (ref 26.0–34.0)
MCHC: 35.3 g/dL (ref 30.0–36.0)
MCV: 92.6 fL (ref 80.0–100.0)
Monocytes Absolute: 0.9 10*3/uL (ref 0.1–1.0)
Monocytes Relative: 8 %
Neutro Abs: 6.1 10*3/uL (ref 1.7–7.7)
Neutrophils Relative %: 56 %
Platelets: 261 10*3/uL (ref 150–400)
RBC: 2.57 MIL/uL — ABNORMAL LOW (ref 4.22–5.81)
RDW: 14.2 % (ref 11.5–15.5)
WBC: 10.9 10*3/uL — ABNORMAL HIGH (ref 4.0–10.5)
nRBC: 0.2 % (ref 0.0–0.2)

## 2018-12-31 MED ORDER — PROMETHAZINE HCL 25 MG PO TABS
25.0000 mg | ORAL_TABLET | Freq: Once | ORAL | Status: AC
  Administered 2018-12-31: 25 mg via ORAL
  Filled 2018-12-31: qty 1

## 2018-12-31 MED ORDER — LACTULOSE 10 GM/15ML PO SOLN
30.0000 g | Freq: Once | ORAL | Status: AC
  Administered 2018-12-31: 20 g via ORAL
  Filled 2018-12-31: qty 60

## 2018-12-31 NOTE — Progress Notes (Addendum)
Patient  complains of constipation and reports using Lactulose in the past.  Provider updated, V.O recieved to give Lactulose 30 gm once P.O. for constipation.

## 2018-12-31 NOTE — Progress Notes (Signed)
Patient ID: LANTZ HERMANN, male   DOB: July 23, 1975, 43 y.o.   MRN: [id number] Subjective: Brenen Beigel, a 43 year old male with a medical history significant for sickle cell disease, chronic pain syndrome, opiate dependence, and anemia of chronic disease was admitted for sickle cell pain crisis.   Patient says pain is improving, down to 6/10, goal is 3/10.  Pain is still localized to extremities and lower back.  Patient denies any fever, shortness of breath, dizziness, cough, chest pain, dysuria, nausea, vomiting, diarrhea or constipation.  Objective:  Vital signs in last 24 hours:  Vitals:   12/31/18 0441 12/31/18 0531 12/31/18 0822 12/31/18 1056  BP: (!) 137/93   138/81  Pulse: 72   (!) 59  Resp: 13 11 11 16   Temp: 98.2 F (36.8 C)   98 F (36.7 C)  TempSrc: Oral   Oral  SpO2: 94% 96% 99% 95%  Weight:      Height:        Intake/Output from previous day:   Intake/Output Summary (Last 24 hours) at 12/31/2018 1319 Last data filed at 12/31/2018 1145 Gross per 24 hour  Intake 662.4 ml  Output 2805 ml  Net -2142.6 ml    Physical Exam: General: Alert, awake, oriented x3, in no acute distress.  HEENT: [employer], EOMI Neck: Trachea midline,  no masses, no thyromegal,y no JVD, no carotid bruit OROPHARYNX:  Moist, No exudate/ erythema/lesions.  Heart: Regular rate and rhythm, without murmurs, rubs, gallops, PMI non-displaced, no heaves or thrills on palpation.  Lungs: Clear to auscultation, no wheezing or rhonchi noted. No increased vocal fremitus resonant to percussion  Abdomen: Soft, nontender, nondistended, positive bowel sounds, no masses no hepatosplenomegaly noted..  Neuro: No focal neurological deficits noted cranial nerves II through XII grossly intact. DTRs 2+ bilaterally upper and lower extremities. Strength 5 out of 5 in bilateral upper and lower extremities. Musculoskeletal: No warm swelling or erythema around joints, no spinal tenderness noted. Psychiatric: Patient alert  and oriented x3, good insight and cognition, good recent to remote recall. Lymph node survey: No cervical axillary or inguinal lymphadenopathy noted.  Lab Results:  Basic Metabolic Panel:    Component Value Date/Time   NA 138 12/30/2018 0355   K 4.1 12/30/2018 0355   CL 104 12/30/2018 0355   CO2 24 12/30/2018 0355   BUN 13 12/30/2018 0355   CREATININE 0.70 12/30/2018 0355   GLUCOSE 96 12/30/2018 0355   CALCIUM 9.1 12/30/2018 0355   CBC:    Component Value Date/Time   WBC 10.9 (H) 12/31/2018 0821   HGB 8.4 (L) 12/31/2018 0821   HCT 23.8 (L) 12/31/2018 0821   PLT 261 12/31/2018 0821   MCV 92.6 12/31/2018 0821   NEUTROABS 6.1 12/31/2018 0821   LYMPHSABS 2.9 12/31/2018 0821   MONOABS 0.9 12/31/2018 0821   EOSABS 0.9 (H) 12/31/2018 0821   BASOSABS 0.1 12/31/2018 8338    Recent Results (from the past 240 hour(s))  SARS Coronavirus 2 [facility] order, Performed in [facility] hospital lab) Nasopharyngeal Nasopharyngeal Swab     Status: None   Collection Time: 12/28/18 11:47 AM   Specimen: Nasopharyngeal Swab  Result Value Ref Range Status   SARS Coronavirus 2 NEGATIVE NEGATIVE Final    Comment: (NOTE) If result is NEGATIVE SARS-CoV-2 target nucleic acids are NOT DETECTED. The SARS-CoV-2 RNA is generally detectable in upper and lower  respiratory specimens during the acute phase of infection. The lowest  concentration of SARS-CoV-2 viral copies this assay can  detect is 250  copies / mL. A negative result does not preclude SARS-CoV-2 infection  and should not be used as the sole basis for treatment or other  patient management decisions.  A negative result may occur with  improper specimen collection / handling, submission of specimen other  than nasopharyngeal swab, presence of viral mutation(s) within the  areas targeted by this assay, and inadequate number of viral copies  (<250 copies / mL). A negative result must be combined with clinical  observations, patient history,  and epidemiological information. If result is POSITIVE SARS-CoV-2 target nucleic acids are DETECTED. The SARS-CoV-2 RNA is generally detectable in upper and lower  respiratory specimens dur ing the acute phase of infection.  Positive  results are indicative of active infection with SARS-CoV-2.  Clinical  correlation with patient history and other diagnostic information is  necessary to determine patient infection status.  Positive results do  not rule out bacterial infection or co-infection with other viruses. If result is PRESUMPTIVE POSTIVE SARS-CoV-2 nucleic acids MAY BE PRESENT.   A presumptive positive result was obtained on the submitted specimen  and confirmed on repeat testing.  While 2019 novel coronavirus  (SARS-CoV-2) nucleic acids may be present in the submitted sample  additional confirmatory testing may be necessary for epidemiological  and / or clinical management purposes  to differentiate between  SARS-CoV-2 and other Sarbecovirus currently known to infect humans.  If clinically indicated additional testing with an alternate test  methodology [phone]) is advised. The SARS-CoV-2 RNA is generally  detectable in upper and lower respiratory sp ecimens during the acute  phase of infection. The expected result is Negative. Fact Sheet for Patients:  BoilerBrush.com.cy Fact Sheet for Healthcare Providers: https://pope.com/ This test is not yet approved or cleared by the [place] FDA and has been authorized for detection and/or diagnosis of SARS-CoV-2 by FDA under an Emergency Use Authorization (EUA).  This EUA will remain in effect (meaning this test can be used) for the duration of the COVID-19 declaration under Section 564(b)(1) of the Act, 21 U.S.C. section 360bbb-3(b)(1), unless the authorization is terminated or revoked sooner. Performed at [facility], [address] [place], [place]  [address]   Urine culture     Status: None   Collection Time: 12/28/18  6:31 PM   Specimen: Urine, Clean Catch  Result Value Ref Range Status   Specimen Description   Final    URINE, CLEAN CATCH Performed at [facility], [address] [place], [place] [address]    Special Requests   Final    NONE Performed at [facility], [address], [place], [place] [address]    Culture   Final    NO GROWTH Performed at [facility], [address] [place], [place] [address]    Report Status 12/29/2018 FINAL  Final    Studies/Results: No results found.  Medications: Scheduled Meds: . enoxaparin (LOVENOX) injection  40 mg Subcutaneous Q24H  . folic acid  1 mg Oral QPM  . HYDROmorphone   Intravenous Q4H  . hydroxyurea  500 mg Oral BID  . ketorolac  15 mg Intravenous Q6H  . morphine  60 mg Oral BID  . naloxegol oxalate  25 mg Oral QHS  . promethazine  25 mg Oral Once  . senna-docusate  1 tablet Oral BID   Continuous Infusions: . sodium chloride 10 mL/hr at 12/30/18 2159  . diphenhydrAMINE     PRN Meds:.diphenhydrAMINE **  OR** diphenhydrAMINE, morphine, naloxone **AND** sodium chloride flush, ondansetron, polyethylene glycol  Assessment/Plan: Principal Problem:   Sickle cell pain crisis (HCC) Active Problems:   Anemia of chronic disease   Hypokalemia   History of tobacco abuse   Chronic pain syndrome  1. Hb Sickle Cell Disease with crisis: [facility], continue weight based Dilaudid PCA at current setting, continue IV Toradol 15 mg Q 6 H, Monitor vitals very closely, Re-evaluate pain scale regularly, 2 L of Oxygen by . 2. Leukocytosis: Stable. Patient is afebrile. No sign of infection or inflammation. We will continue to monitor without antibiotics. 3. Sickle Cell Anemia: Hemoglobin is stable at baseline.  No clinical indication for blood transfusion today. 4. Chronic pain Syndrome: Continue home pain medications. 5. Tobacco  dependence: Dimas AguasHoward was counseled on the dangers of tobacco use, and was advised to quit. Reviewed strategies to maximize success, including removing cigarettes and smoking materials from environment, stress management and support of family/friends.  Code Status: Full Code Family Communication: N/A Disposition Plan: Not yet ready for discharge  Asyia Hornung  If 7PM-7AM, please contact night-coverage.  12/31/2018, 1:19 PM  LOS: 3 days

## 2019-01-01 MED ORDER — HYDROMORPHONE HCL 1 MG/ML IJ SOLN: 2.0000 mg | Freq: Once | INTRAMUSCULAR | Status: DC

## 2019-01-01 NOTE — Progress Notes (Signed)
Patient transferred to 1329. 

## 2019-01-01 NOTE — Progress Notes (Signed)
Patient is a transfer from 8. Report received from [facility]. Patient is alert and verbally responsive and has Dilaudid PCA infusion.

## 2019-01-01 NOTE — Progress Notes (Signed)
Report give to Nadine Counts RN. Patient currently stable with no complaints.

## 2019-01-01 NOTE — Discharge Instructions (Signed)
Sickle Cell Anemia, Adult ° °Sickle cell anemia is a condition in which red blood cells have an abnormal “sickle” shape. Red blood cells carry oxygen through the body. Sickle-shaped red blood cells do not live as long as normal red blood cells. They also clump together and block blood from flowing through the blood vessels. This condition prevents the body from getting enough oxygen. Sickle cell anemia causes organ damage and pain. It also increases the risk of infection. °What are the causes? °This condition is caused by a gene that is passed from parent to child (inherited). Receiving two copies of the gene causes the disease. Receiving one copy causes the "trait," which means that symptoms are milder or not present. °What increases the risk? °This condition is more likely to develop if your ancestors were from [place], the Mediterranean, [place] or [place], the [place], [place], or the [place]. °What are the signs or symptoms? °Symptoms of this condition include: °· Episodes of pain (crises), especially in the hands and feet, joints, back, chest, or abdomen. The pain can be triggered by: °? An illness, especially if there is dehydration. °? Doing an activity with great effort (overexertion). °? Exposure to extreme temperature changes. °? High altitude. °· Fatigue. °· Shortness of breath or difficulty breathing. °· Dizziness. °· Pale skin or yellowed skin (jaundice). °· Frequent bacterial infections. °· Pain and swelling in the hands and feet (hand-food syndrome). °· Prolonged, painful erection of the penis (priapism). °· Acute chest syndrome. Symptoms of this include: °? Chest pain. °? Fever. °? Cough. °? Fast breathing. °· Stroke. °· Decreased activity. °· Loss of appetite. °· Change in behavior. °· Headaches. °· Seizures. °· Vision changes. °· Skin ulcers. °· Heart disease. °· High blood pressure. °· Gallstones. °· Liver and kidney problems. °How is this diagnosed? °This condition is diagnosed with  blood tests that check for the gene that causes this condition. °How is this treated? °There is no cure for most cases of this condition. Treatment focuses on managing your symptoms and preventing complications of the disease. Your health care provider will work with you to identify the best treatment options for you based on an assessment of your condition. Treatment may include: °· Medicines, including: °? Pain medicines. °? Antibiotic medicines for infection. °? Medicines to increase the production of a protein in red blood cells that helps carry oxygen in the body (hemoglobin). °· Fluids to treat pain and swelling. °· Oxygen to treat acute chest syndrome. °· Blood transfusions to treat symptoms such as fatigue, stroke, and acute chest syndrome. °· Massage and physical therapy for pain. °· Regular tests to monitor your condition, such as blood tests, X-rays, CT scans, MRI scans, ultrasounds, and lung function tests. These should be done every 3-12 months, depending on your age. °· Hematopoietic stem cell transplant. This is a procedure to replace abnormal stem cells with healthy stem cells from a donor's bone marrow. Stem cells are cells that can develop into blood cells, and bone marrow is the spongy tissue inside the bones. °Follow these instructions at home: °Medicines °· Take over-the-counter and prescription medicines only as told by your health care provider. °· If you were prescribed an antibiotic medicine, take it as told by your health care provider. Do not stop taking the antibiotic even if you start to feel better. °· If you develop a fever, do not take medicines to reduce the fever right away. This could cover up another problem. Notify your health care provider. °Managing   pain, stiffness, and swelling °· Try these methods to help ease your pain: °? Using a heating pad. °? Taking a warm bath. °? Distracting yourself, such as by watching TV. °Eating and drinking °· Drink enough fluid to keep your urine  clear or pale yellow. Drink more in hot weather and during exercise. °· Limit or avoid drinking alcohol. °· Eat a balanced and nutritious diet. Eat plenty of fruits, vegetables, whole grains, and lean protein. °· Take vitamins and supplements as directed by your health care provider. °Traveling °· When traveling, keep these with you: °? Your medical information. °? The names of your health care providers. °? Your medicines. °· If you have to travel by air, ask about precautions you should take. °Activity °· Get plenty of rest. °· Avoid activities that will lower your oxygen levels, such as exercising vigorously. °General instructions °· Do not use any products that contain nicotine or tobacco, such as cigarettes and e-cigarettes. They lower blood oxygen levels. If you need help quitting, ask your health care provider. °· Consider wearing a medical alert bracelet. °· Avoid high altitudes. °· Avoid extreme temperatures and extreme temperature changes. °· Keep all follow-up visits as told by your health care provider. This is important. °Contact a health care provider if: °· You develop joint pain. °· Your feet or hands swell or have pain. °· You have fatigue. °Get help right away if: °· You have symptoms of infection. These include: °? Fever. °? Chills. °? Extreme tiredness. °? Irritability. °? Poor eating. °? Vomiting. °· You feel dizzy or faint. °· You have new abdominal pain, especially on the left side near the stomach area. °· You develop priapism. °· You have numbness in your arms or legs or have trouble moving them. °· You have trouble talking. °· You develop pain that cannot be controlled with medicine. °· You become short of breath. °· You have rapid breathing. °· You have a persistent cough. °· You have pain in your chest. °· You develop a severe headache or stiff neck. °· You feel bloated without eating or after eating a small amount of food. °· Your skin is pale. °· You suddenly lose  vision. °Summary °· Sickle cell anemia is a condition in which red blood cells have an abnormal “sickle” shape. This disease can cause organ damage and chronic pain, and it can raise your risk of infection. °· Sickle cell anemia is a genetic disorder. °· Treatment focuses on managing your symptoms and preventing complications of the disease. °· Get medical help right away if you have any signs of infection, such as a fever. °This information is not intended to replace advice given to you by your health care provider. Make sure you discuss any questions you have with your health care provider. °Document Released: 07/22/2005 Document Revised: 08/05/2018 Document Reviewed: 05/19/2016 °[employer]vier Patient Education © 2020 [employer] ° °

## 2019-01-01 NOTE — Discharge Summary (Signed)
Physician Discharge Summary  [name] MHD:[id number] DOB: July 21, 1975 DOA: 12/28/2018  PCP: Nolene Ebbs, MD  Admit date: 12/28/2018  Discharge date: 01/01/2019  Discharge Diagnoses:  Principal Problem:   Sickle cell pain crisis ([place]) Active Problems:   Anemia of chronic disease   History of tobacco abuse   Chronic pain syndrome  Discharge Condition: Stable  Disposition:  Follow-up Information    Nolene Ebbs, MD. Go in 5 day(s).   Specialty: Internal Medicine Contact information: [address] [place] [address] [phone]          Pt is discharged home in good condition and is to follow up with Nolene Ebbs, MD this week to have labs evaluated. [name] is instructed to increase activity slowly and balance with rest for the next few days, and use prescribed medication to complete treatment of pain  Diet: Regular Wt Readings from Last 3 Encounters:  12/28/18 77.1 kg  04/29/18 78.2 kg  01/22/18 77.1 kg   History of present illness:  [name]  is a 43 y.o. male with a medical history significant for sickle cell disease, chronic pain syndrome, avascular necrosis of left hip, s/p right hip replacement, opiate dependence, and tobacco dependence presented to the ER complaining of chest pain over the last several weeks.  Patient states that chest pain is consistent with previous sickle cell crisis.  Patient also endorses shortness of breath.  Patient also has a history of chronic pain and acute chest syndrome.  Patient states that he presented to ER because pain felt similar to previous acute chest syndrome episode.  Patient states the pain is worse with deep inhalation and has been consistent for 2 weeks and slowly worsening.  He has been taking MS Contin and oxycodone for pain but states that it has not been helping.  He says that he has been avoiding the ER over the last several weeks due to potential exposure to COVID-19.  Pain intensity 8/10  characterized as constant and sharp. Patient denies fever, chills, heart palpitations, dysuria, nausea, vomiting, or diarrhea.  ER course: WBCs 13.8, hemoglobin 9.0, and platelets 292.  Troponin 3, potassium 3.4 and creatinine 0.74.  CT angiogram shows no pulmonary emboli or acute abnormality.  Shows stable changes of COPD and chronic interstitial lung disease.  Stable mild mediastinal and bilateral hilar adenopathy, most likely reactive.  Stable diffuse patchy sclerosis of the bones, compatible with patient's sickle cell disease.  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  Mild atheromatous coronary artery calcifications. Patient afebrile and maintaining oxygen saturation above 90%. Pain persists despite multiple doses of IV Dilaudid, IV Toradol, and IV fluids.  Patient admitted to telemetry for management of pain crisis.  Hospital Course:  Patient was admitted for sickle cell pain crisis and managed appropriately with IVF, IV Dilaudid via PCA and IV Toradol, as well as other adjunct therapies per sickle cell pain management protocols. Patient did well on this regimen, pain slowly returned to baseline of 3/10 from 9/10 on admission. His hemoglobin remained stable at baseline, patient did not require blood transfusion during this admission.  He was ambulating well with no significant pain, tolerating p.o. intake with no restrictions. Patient was discharged home today in a hemodynamically stable condition. Patient has appointment with PCP in 3 days and plans to keep the appointment.  He did not require any medication refill upon discharge.  Discharge Exam: Vitals:   01/01/19 0559 01/01/19 0800  BP: 131/87  Pulse: 74   Resp: 19 19  Temp: 98.1 F (36.7 C)   SpO2: 94% 95%   Vitals:   01/01/19 0052 01/01/19 0526 01/01/19 0559 01/01/19 0800  BP: (!) 144/97  131/87   Pulse: 79  74   Resp:  12 19 19   Temp: 98.7 F (37.1 C)  98.1 F (36.7 C)   TempSrc: Oral  Oral    SpO2: 100% 95% 94% 95%  Weight:      Height:       General appearance : Awake, alert, not in any distress. Speech Clear. Not toxic looking HEENT: Atraumatic and Normocephalic, pupils equally reactive to light and accomodation Neck: Supple, no JVD. No cervical lymphadenopathy.  Chest: Good air entry bilaterally, no added sounds  CVS: S1 S2 regular, no murmurs.  Abdomen: Bowel sounds present, Non tender and not distended with no gaurding, rigidity or rebound. Extremities: B/L Lower Ext shows no edema, both legs are warm to touch Neurology: Awake alert, and oriented X 3, CN II-XII intact, Non focal Skin: No Rash  Discharge Instructions  Discharge Instructions    Diet - low sodium heart healthy   Complete by: As directed    Increase activity slowly   Complete by: As directed      Allergies as of 01/01/2019   No Known Allergies     Medication List    TAKE these medications   cyclobenzaprine 10 MG tablet Commonly known as: FLEXERIL Take 10 mg by mouth at bedtime.   folic acid 1 MG tablet Commonly known as: FOLVITE Take 1 mg by mouth every evening.   hydroxyurea 500 MG capsule Commonly known as: HYDREA Take 500 mg by mouth 2 (two) times daily. May take with food to minimize GI side effects.   ibuprofen 800 MG tablet Commonly known as: ADVIL Take 800 mg by mouth every 8 (eight) hours as needed for moderate pain.   morphine 30 MG tablet Commonly known as: MSIR Take 1 tablet (30 mg total) by mouth daily as needed for severe pain (pain). Resume after completing dilaudid.   morphine 60 MG 12 hr tablet Commonly known as: MS CONTIN Take 60 mg by mouth 2 (two) times daily.   Movantik 25 MG Tabs tablet Generic drug: naloxegol oxalate Take 25 mg by mouth daily.   Narcan 4 MG/0.1ML Liqd nasal spray kit Generic drug: naloxone Place 4 mg into the nose as needed (overdose).   promethazine 25 MG tablet Commonly known as: PHENERGAN Take 25 mg by mouth every 6 (six) hours as  needed for nausea (nausea).       The results of significant diagnostics from this hospitalization (including imaging, microbiology, ancillary and laboratory) are listed below for reference.    Significant Diagnostic Studies: Dg Chest 2 View  Result Date: 12/28/2018 CLINICAL DATA:  Chest pain, sickle crisis EXAM: CHEST - 2 VIEW COMPARISON:  04/29/2018 FINDINGS: The heart size and mediastinal contours are within normal limits. No change in somewhat coarse underlying interstitial opacity and scarring. No acute appearing airspace opacity. The visualized skeletal structures are unremarkable. IMPRESSION: No change in somewhat coarse underlying interstitial opacity and scarring. No acute appearing airspace opacity. Electronically Signed   By: Eddie Candle M.D.   On: 12/28/2018 09:13   Ct Angio Chest Pe W And/or Wo Contrast  Result Date: 12/28/2018 CLINICAL DATA:  Chest pain for the past 2 weeks. Sickle cell disease. Cough for the past week. Fever. Elevated D-dimer. EXAM: CT ANGIOGRAPHY CHEST WITH CONTRAST TECHNIQUE: Multidetector  CT imaging of the chest was performed using the standard protocol during bolus administration of intravenous contrast. Multiplanar CT image reconstructions and MIPs were obtained to evaluate the vascular anatomy. CONTRAST:  132m OMNIPAQUE IOHEXOL 350 MG/ML SOLN COMPARISON:  01/20/2017. FINDINGS: Cardiovascular: The heart remains mildly enlarged. The ascending thoracic aorta remains mildly dilated, continuing measure 3.9 cm in maximum diameter. Mild coronary artery calcifications are again noted. Normally opacified pulmonary arteries with no pulmonary arterial filling defects seen. Mediastinum/Nodes: No significant change in mildly prominent mediastinal and bilateral hilar lymph nodes. The largest is a right subcarinal node with a short axis diameter of 1.3 cm on image number 150 series 5, previously 1.2 cm. Lungs/Pleura: No significant change in multiple bilateral paraseptal bullae  and mild prominence of the interstitial markings in the dependent portions of both lungs. Previously demonstrated areas of airspace consolidation in nodularity are no longer seen. No pleural fluid. Upper Abdomen: Unremarkable. Musculoskeletal: Diffuse patchy sclerosis of the bones has not changed significantly. 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 Review of the MIP images confirms the above findings. IMPRESSION: 1. No pulmonary emboli or acute abnormality. 2. Stable changes of COPD and chronic interstitial lung disease. 3. Stable mild mediastinal and bilateral hilar adenopathy, most likely reactive. 4. Stable diffuse patchy sclerosis of the bones, compatible with the patient's known sickle cell disease. 5. 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 6. Mild atheromatous coronary artery calcifications. 7. Stable ectasia of the ascending thoracic aorta with a maximum diameter of 3.9 cm. Emphysema (ICD10-J43.9). Electronically Signed   By: SClaudie ReveringM.D.   On: 12/28/2018 15:12    Microbiology: Recent Results (from the past 240 hour(s))  SARS Coronavirus 2 ([facility] Nasopharyngeal Nasopharyngeal Swab     Status: None   Collection Time: 12/28/18 11:47 AM   Specimen: Nasopharyngeal Swab  Result Value Ref Range Status   SARS Coronavirus 2 NEGATIVE NEGATIVE Final    Comment: (NOTE) If result is NEGATIVE SARS-CoV-2 target nucleic acids are NOT DETECTED. The SARS-CoV-2 RNA is generally detectable in upper and lower  respiratory specimens during the acute phase of infection. The lowest  concentration of SARS-CoV-2 viral copies this assay can detect is 250  copies / mL. A negative result does not preclude SARS-CoV-2 infection  and should not be used as the sole basis for treatment or other  patient management  decisions.  A negative result may occur with  improper specimen collection / handling, submission of specimen other  than nasopharyngeal swab, presence of viral mutation(s) within the  areas targeted by this assay, and inadequate number of viral copies  (<250 copies / mL). A negative result must be combined with clinical  observations, patient history, and epidemiological information. If result is POSITIVE SARS-CoV-2 target nucleic acids are DETECTED. The SARS-CoV-2 RNA is generally detectable in upper and lower  respiratory specimens dur ing the acute phase of infection.  Positive  results are indicative of active infection with SARS-CoV-2.  Clinical  correlation with patient history and other diagnostic information is  necessary to determine patient infection status.  Positive results do  not rule out bacterial infection or co-infection with other viruses. If result is PRESUMPTIVE POSTIVE SARS-CoV-2 nucleic acids MAY BE PRESENT.   A presumptive positive result was obtained on the submitted specimen  and confirmed on repeat testing.  While  2019 novel coronavirus  (SARS-CoV-2) nucleic acids may be present in the submitted sample  additional confirmatory testing may be necessary for epidemiological  and / or clinical management purposes  to differentiate between  SARS-CoV-2 and other Sarbecovirus currently known to infect humans.  If clinically indicated additional testing with an alternate test  methodology [phone]) is advised. The SARS-CoV-2 RNA is generally  detectable in upper and lower respiratory sp ecimens during the acute  phase of infection. The expected result is Negative. Fact Sheet for Patients:  StrictlyIdeas.no Fact Sheet for Healthcare Providers: BankingDealers.co.za This test is not yet approved or cleared by the [place] FDA and has been authorized for detection and/or diagnosis of SARS-CoV-2 by FDA under an  Emergency Use Authorization (EUA).  This EUA will remain in effect (meaning this test can be used) for the duration of the COVID-19 declaration under Section 564(b)(1) of the Act, 21 U.S.C. section 360bbb-3(b)(1), unless the authorization is terminated or revoked sooner. Performed at [facility], [place] [address], [place], [place] [address]   Urine culture     Status: None   Collection Time: 12/28/18  6:31 PM   Specimen: Urine, Clean Catch  Result Value Ref Range Status   Specimen Description   Final    URINE, CLEAN CATCH Performed at [facility], [place] [address] [place], [place] [address]    Special Requests   Final    NONE Performed at [facility], [place] [address] [place], [place] [address]    Culture   Final    NO GROWTH Performed at [facility], [place] [address], [place], [place] [address]    Report Status 12/29/2018 FINAL  Final     Labs: Basic Metabolic Panel: Recent Labs  Lab 12/28/18 0855 12/30/18 0355  NA 138 138  K 3.4* 4.1  CL 102 104  CO2 25 24  GLUCOSE 75 96  BUN 15 13  CREATININE 0.74 0.70  CALCIUM 9.3 9.1   Liver Function Tests: No results for input(s): AST, ALT, ALKPHOS, BILITOT, PROT, ALBUMIN in the last 168 hours. No results for input(s): LIPASE, AMYLASE in the last 168 hours. No results for input(s): AMMONIA in the last 168 hours. CBC: Recent Labs  Lab 12/28/18 0930 12/29/18 0407 12/30/18 0355 12/31/18 0821  WBC 13.8* 13.1* 13.0* 10.9*  NEUTROABS 7.5 7.1  --  6.1  HGB 9.0* 8.8* 8.3* 8.4*  HCT 25.1* 24.3* 23.1* 23.8*  MCV 91.9 93.5 92.4 92.6  PLT 292 252 241 261   Cardiac Enzymes: No results for input(s): CKTOTAL, CKMB, CKMBINDEX, TROPONINI in the last 168 hours. BNP: Invalid input(s): POCBNP CBG: No results for input(s): GLUCAP in the last 168 hours.  Time coordinating discharge: 50 minutes  Signed:  [facility] 01/01/2019, 11:35  AM

## 2019-12-22 ENCOUNTER — Other Ambulatory Visit: Payer: Self-pay | Admitting: Rheumatology

## 2019-12-22 DIAGNOSIS — M25512 Pain in left shoulder: Secondary | ICD-10-CM

## 2020-01-18 ENCOUNTER — Other Ambulatory Visit: Payer: Self-pay

## 2020-01-18 ENCOUNTER — Ambulatory Visit
Admission: RE | Admit: 2020-01-18 | Discharge: 2020-01-18 | Disposition: A | Discharge status: Home / Self Care | Payer: Medicare Other | Source: Ambulatory Visit | Attending: Rheumatology | Admitting: Rheumatology

## 2020-01-18 DIAGNOSIS — M25512 Pain in left shoulder: Secondary | ICD-10-CM

## 2020-01-18 MED ORDER — GADOBENATE DIMEGLUMINE 529 MG/ML IV SOLN
17.0000 mL | Freq: Once | INTRAVENOUS | Status: AC | PRN
  Administered 2020-01-18: 17 mL via INTRAVENOUS

## 2020-09-06 ENCOUNTER — Emergency Department (HOSPITAL_COMMUNITY): Payer: Medicare Other

## 2020-09-06 ENCOUNTER — Encounter (HOSPITAL_COMMUNITY): Payer: Self-pay

## 2020-09-06 ENCOUNTER — Other Ambulatory Visit: Payer: Self-pay

## 2020-09-06 ENCOUNTER — Inpatient Hospital Stay (HOSPITAL_COMMUNITY)
Admission: EM | Admit: 2020-09-06 | Discharge: 2020-09-09 | DRG: 812 | Disposition: A | Discharge status: Home / Self Care | Payer: Medicare Other | Attending: Family Medicine | Admitting: Family Medicine

## 2020-09-06 DIAGNOSIS — F17210 Nicotine dependence, cigarettes, uncomplicated: Secondary | ICD-10-CM | POA: Diagnosis present

## 2020-09-06 DIAGNOSIS — Z720 Tobacco use: Secondary | ICD-10-CM | POA: Diagnosis present

## 2020-09-06 DIAGNOSIS — Z20822 Contact with and (suspected) exposure to covid-19: Secondary | ICD-10-CM | POA: Diagnosis present

## 2020-09-06 DIAGNOSIS — D5700 Hb-SS disease with crisis, unspecified: Principal | ICD-10-CM | POA: Diagnosis present

## 2020-09-06 DIAGNOSIS — Z96641 Presence of right artificial hip joint: Secondary | ICD-10-CM | POA: Diagnosis present

## 2020-09-06 DIAGNOSIS — M879 Osteonecrosis, unspecified: Secondary | ICD-10-CM | POA: Diagnosis present

## 2020-09-06 DIAGNOSIS — Z79899 Other long term (current) drug therapy: Secondary | ICD-10-CM

## 2020-09-06 DIAGNOSIS — G894 Chronic pain syndrome: Secondary | ICD-10-CM | POA: Diagnosis present

## 2020-09-06 LAB — COMPREHENSIVE METABOLIC PANEL
ALT: 12 U/L (ref 0–44)
AST: 23 U/L (ref 15–41)
Albumin: 4.6 g/dL (ref 3.5–5.0)
Alkaline Phosphatase: 85 U/L (ref 38–126)
Anion gap: 6 (ref 5–15)
BUN: 12 mg/dL (ref 6–20)
CO2: 27 mmol/L (ref 22–32)
Calcium: 9.4 mg/dL (ref 8.9–10.3)
Chloride: 104 mmol/L (ref 98–111)
Creatinine, Ser: 0.75 mg/dL (ref 0.61–1.24)
GFR, Estimated: >60 mL/min (ref >60)
Glucose, Bld: 74 mg/dL (ref 70–99)
Potassium: 3.9 mmol/L (ref 3.5–5.1)
Sodium: 137 mmol/L (ref 135–145)
Total Bilirubin: 1.7 mg/dL — ABNORMAL HIGH (ref 0.3–1.2)
Total Protein: 8.3 g/dL — ABNORMAL HIGH (ref 6.5–8.1)

## 2020-09-06 LAB — CBC WITH DIFFERENTIAL/PLATELET
Abs Immature Granulocytes: 0.06 10*3/uL (ref 0.00–0.07)
Basophils Absolute: 0.1 10*3/uL (ref 0.0–0.1)
Basophils Relative: 1 %
Eosinophils Absolute: 0.4 10*3/uL (ref 0.0–0.5)
Eosinophils Relative: 3 %
HCT: 28.5 % — ABNORMAL LOW (ref 39.0–52.0)
Hemoglobin: 10.3 g/dL — ABNORMAL LOW (ref 13.0–17.0)
Immature Granulocytes: 1 %
Lymphocytes Relative: 18 %
Lymphs Abs: 2.1 10*3/uL (ref 0.7–4.0)
MCH: 33.7 pg (ref 26.0–34.0)
MCHC: 36.1 g/dL — ABNORMAL HIGH (ref 30.0–36.0)
MCV: 93.1 fL (ref 80.0–100.0)
Monocytes Absolute: 1 10*3/uL (ref 0.1–1.0)
Monocytes Relative: 9 %
Neutro Abs: 7.7 10*3/uL (ref 1.7–7.7)
Neutrophils Relative %: 68 %
Platelets: 341 10*3/uL (ref 150–400)
RBC: 3.06 MIL/uL — ABNORMAL LOW (ref 4.22–5.81)
RDW: 13.8 % (ref 11.5–15.5)
WBC: 11.3 10*3/uL — ABNORMAL HIGH (ref 4.0–10.5)
nRBC: 0 % (ref 0.0–0.2)

## 2020-09-06 LAB — D-DIMER, QUANTITATIVE: D-Dimer, Quant: 3.09 ug/mL-FEU — ABNORMAL HIGH (ref 0.00–0.50)

## 2020-09-06 LAB — CBG MONITORING, ED: Glucose-Capillary: 71 mg/dL (ref 70–99)

## 2020-09-06 LAB — RETICULOCYTES
Immature Retic Fract: 21.3 % — ABNORMAL HIGH (ref 2.3–15.9)
RBC.: 3.07 MIL/uL — ABNORMAL LOW (ref 4.22–5.81)
Retic Count, Absolute: 67.8 10*3/uL (ref 19.0–186.0)
Retic Ct Pct: 2.2 % (ref 0.4–3.1)

## 2020-09-06 LAB — RESP PANEL BY RT-PCR (FLU A&B, COVID) ARPGX2
Influenza A by PCR: NEGATIVE
Influenza B by PCR: NEGATIVE
SARS Coronavirus 2 by RT PCR: NEGATIVE

## 2020-09-06 MED ORDER — HYDROMORPHONE 1 MG/ML IV SOLN
INTRAVENOUS | Status: DC
  Administered 2020-09-06: 30 mg via INTRAVENOUS
  Administered 2020-09-07: 3.5 mg via INTRAVENOUS
  Administered 2020-09-07: 30 mg via INTRAVENOUS
  Administered 2020-09-07: 7 mg via INTRAVENOUS
  Administered 2020-09-07: 6.5 mg via INTRAVENOUS
  Administered 2020-09-07: 2 mg via INTRAVENOUS
  Administered 2020-09-07: 4.5 mg via INTRAVENOUS
  Administered 2020-09-07: 5.5 mg via INTRAVENOUS
  Administered 2020-09-07: 3.5 mg via INTRAVENOUS
  Administered 2020-09-08: 30 mg via INTRAVENOUS
  Administered 2020-09-08: 7 mg via INTRAVENOUS
  Administered 2020-09-08: 4.5 mg via INTRAVENOUS
  Administered 2020-09-08 (×2): 5 mg via INTRAVENOUS
  Administered 2020-09-08: 6 mg via INTRAVENOUS
  Administered 2020-09-09: 4.5 mg via INTRAVENOUS
  Administered 2020-09-09: 3.5 mg via INTRAVENOUS
  Administered 2020-09-09: 30 mg via INTRAVENOUS
  Filled 2020-09-06 (×4): qty 30

## 2020-09-06 MED ORDER — ONDANSETRON HCL 4 MG/2ML IJ SOLN
4.0000 mg | Freq: Four times a day (QID) | INTRAMUSCULAR | Status: DC | PRN
  Administered 2020-09-08 – 2020-09-09 (×2): 4 mg via INTRAVENOUS
  Filled 2020-09-06: qty 2

## 2020-09-06 MED ORDER — KETOROLAC TROMETHAMINE 15 MG/ML IJ SOLN
15.0000 mg | Freq: Four times a day (QID) | INTRAMUSCULAR | Status: DC
  Administered 2020-09-06 – 2020-09-09 (×11): 15 mg via INTRAVENOUS
  Filled 2020-09-06 (×11): qty 1

## 2020-09-06 MED ORDER — ENOXAPARIN SODIUM 40 MG/0.4ML IJ SOSY
40.0000 mg | PREFILLED_SYRINGE | INTRAMUSCULAR | Status: DC
  Administered 2020-09-06 – 2020-09-08 (×3): 40 mg via SUBCUTANEOUS
  Filled 2020-09-06 (×3): qty 0.4

## 2020-09-06 MED ORDER — HYDROMORPHONE HCL 2 MG/ML IJ SOLN
2.0000 mg | INTRAMUSCULAR | Status: AC
  Administered 2020-09-06: 2 mg via INTRAVENOUS
  Filled 2020-09-06: qty 1

## 2020-09-06 MED ORDER — DIPHENHYDRAMINE HCL 25 MG PO CAPS
25.0000 mg | ORAL_CAPSULE | ORAL | Status: DC | PRN
Start: 2020-09-06 — End: 2020-09-09
  Filled 2020-09-06: qty 1

## 2020-09-06 MED ORDER — ONDANSETRON HCL 4 MG/2ML IJ SOLN
4.0000 mg | INTRAMUSCULAR | Status: DC | PRN
  Administered 2020-09-06: 4 mg via INTRAVENOUS
  Filled 2020-09-06: qty 2

## 2020-09-06 MED ORDER — NALOXEGOL OXALATE 25 MG PO TABS
25.0000 mg | ORAL_TABLET | Freq: Every day | ORAL | Status: DC
  Administered 2020-09-07 – 2020-09-09 (×3): 25 mg via ORAL
  Filled 2020-09-06 (×3): qty 1

## 2020-09-06 MED ORDER — HYDROMORPHONE HCL 1 MG/ML IJ SOLN
1.0000 mg | Freq: Once | INTRAMUSCULAR | Status: AC
  Administered 2020-09-06: 1 mg via INTRAVENOUS
  Filled 2020-09-06: qty 1

## 2020-09-06 MED ORDER — HYDROXYUREA 500 MG PO CAPS
500.0000 mg | ORAL_CAPSULE | Freq: Two times a day (BID) | ORAL | Status: DC
  Administered 2020-09-06 – 2020-09-09 (×6): 500 mg via ORAL
  Filled 2020-09-06 (×6): qty 1

## 2020-09-06 MED ORDER — NALOXONE HCL 0.4 MG/ML IJ SOLN: 0.4000 mg | INTRAMUSCULAR | Status: DC | PRN

## 2020-09-06 MED ORDER — DEXTROSE-NACL 5-0.45 % IV SOLN: INTRAVENOUS | Status: DC

## 2020-09-06 MED ORDER — DIPHENHYDRAMINE HCL 50 MG/ML IJ SOLN
25.0000 mg | Freq: Once | INTRAMUSCULAR | Status: AC
  Administered 2020-09-06: 25 mg via INTRAVENOUS
  Filled 2020-09-06: qty 1

## 2020-09-06 MED ORDER — FOLIC ACID 1 MG PO TABS
1.0000 mg | ORAL_TABLET | Freq: Every day | ORAL | Status: DC
  Administered 2020-09-07 – 2020-09-09 (×3): 1 mg via ORAL
  Filled 2020-09-06 (×3): qty 1

## 2020-09-06 MED ORDER — POLYETHYLENE GLYCOL 3350 17 G PO PACK
17.0000 g | PACK | Freq: Every day | ORAL | Status: DC | PRN
  Administered 2020-09-07: 17 g via ORAL
  Filled 2020-09-06: qty 1

## 2020-09-06 MED ORDER — DIPHENHYDRAMINE HCL 25 MG PO TABS: 25.0000 mg | ORAL_TABLET | Freq: Four times a day (QID) | ORAL | Status: DC | PRN

## 2020-09-06 MED ORDER — SODIUM CHLORIDE 0.9% FLUSH: 9.0000 mL | INTRAVENOUS | Status: DC | PRN

## 2020-09-06 MED ORDER — HYDROMORPHONE HCL 2 MG/ML IJ SOLN
2.0000 mg | INTRAMUSCULAR | Status: AC
Start: 2020-09-06 — End: 2020-09-06
  Administered 2020-09-06: 2 mg via INTRAVENOUS
  Filled 2020-09-06: qty 1

## 2020-09-06 MED ORDER — DIPHENHYDRAMINE HCL 50 MG/ML IJ SOLN
25.0000 mg | INTRAMUSCULAR | Status: DC | PRN
  Administered 2020-09-07 (×2): 25 mg via INTRAVENOUS
  Filled 2020-09-06: qty 0.5
  Filled 2020-09-06: qty 25
  Filled 2020-09-06 (×2): qty 0.5

## 2020-09-06 MED ORDER — SENNOSIDES-DOCUSATE SODIUM 8.6-50 MG PO TABS
1.0000 | ORAL_TABLET | Freq: Two times a day (BID) | ORAL | Status: DC
  Administered 2020-09-06 – 2020-09-09 (×6): 1 via ORAL
  Filled 2020-09-06 (×6): qty 1

## 2020-09-06 MED ORDER — PROMETHAZINE HCL 25 MG PO TABS
25.0000 mg | ORAL_TABLET | Freq: Four times a day (QID) | ORAL | Status: DC | PRN
  Administered 2020-09-06 – 2020-09-07 (×3): 25 mg via ORAL
  Filled 2020-09-06 (×3): qty 1

## 2020-09-06 MED ORDER — NALOXONE HCL 4 MG/0.1ML NA LIQD: 4.0000 mg | Freq: Once | NASAL | Status: DC | PRN

## 2020-09-06 NOTE — ED Provider Notes (Signed)
[place] DEPT Provider Note   CSN: [id number] Arrival date & time: 09/06/20  1258     History Chief Complaint  Patient presents with  . Sickle Cell Pain Crisis    [name] is a 45 y.o. male.  Patient with sickle cell disease.  Patient with a complaint of right lateral chest pain now for about a week.  There is been trying to manage at home but it has not gotten under control.  He took his morphine Motrin Phenergan and Benadryl at home.  Denies any fevers.  States that the pain is typical for his sickle cell pain.  He has not had any's sugar and shortness of breath.  Oxygen sats here on room air 98% and he is not tachycardic.  No fevers.  Patient has not seen very frequently by [place] but when he is he is almost always admitted.  Last time seen was September 2020.        Past Medical History:  Diagnosis Date  . Avascular necrosis of hip ([place])    bilateral  . Avascular necrosis of hip, left ([place]) 08/27/2011  . Blood transfusion   . Infection of bone, shoulder region ([place])    left shoulder  . Pneumonia   . Sickle cell crisis [facility])     Patient Active Problem List   Diagnosis Date Noted  . Sickle cell crisis acute chest syndrome ([place]) 04/29/2018  . History of avascular necrosis of capital femoral epiphysis 04/29/2018  . Chronic pain syndrome 04/29/2018  . Acute chest syndrome ([place])   . Sickle cell anemia with crisis ([place]) 01/22/2018  . Sickle cell crisis ([place]) 01/20/2017  . Sickle cell anemia ([place]) 08/13/2015  . Sickle cell disease, type [place] ([place]) 10/18/2012  . Sickle cell pain crisis ([place]) 10/13/2012  . History of tobacco abuse 10/13/2012  . Leukocytosis 05/02/2012  . Anemia of chronic disease 11/08/2011  . Avascular necrosis of hip, left ([place]) 08/27/2011    Past Surgical History:  Procedure Laterality Date  . BONE GRAFT HIP ILIAC CREST    . JOINT REPLACEMENT  2006   right total hip arthroplasty  . Orif right hip fracture  1995        Family History  Adopted: Yes    Social History   Tobacco Use  . Smoking status: Current Some Day Smoker    Packs/day: 0.50    Types: Cigarettes    Last attempt to quit: 01/27/2012    Years since quitting: 8.6  . Smokeless tobacco: Never Used  Vaping Use  . Vaping Use: Never used  Substance Use Topics  . Alcohol use: No  . Drug use: No    Home Medications Prior to Admission medications   Medication Sig Start Date End Date Taking? Authorizing Provider  cyclobenzaprine (FLEXERIL) 10 MG tablet Take 10 mg by mouth at bedtime.    [provider]  folic acid (FOLVITE) 1 MG tablet Take 1 mg by mouth every evening.     [provider]  hydroxyurea (HYDREA) 500 MG capsule Take 500 mg by mouth 2 (two) times daily. May take with food to minimize GI side effects.    [provider]  ibuprofen (ADVIL,MOTRIN) 800 MG tablet Take 800 mg by mouth every 8 (eight) hours as needed for moderate pain.  09/07/16   [provider]  morphine (MS CONTIN) 60 MG 12 hr tablet Take 60 mg by mouth 2 (two) times daily. 12/29/16   [provider]  morphine (MSIR) 30 MG tablet Take 1 tablet (30 mg total) by mouth daily as needed for severe pain (pain). Resume after completing dilaudid. 02/17/16   Leana Gamer, MD  MOVANTIK 25 MG TABS tablet Take 25 mg by mouth daily. 12/17/17   [provider]  NARCAN 4 MG/0.1ML LIQD nasal spray kit Place 4 mg into the nose as needed (overdose).  12/16/17   [provider]  promethazine (PHENERGAN) 25 MG tablet Take 25 mg by mouth every 6 (six) hours as needed for nausea (nausea).     [provider]    Allergies    Patient has no known allergies.  Review of Systems   Review of Systems  Constitutional: Negative for chills and fever.  HENT: Negative for congestion, rhinorrhea and sore throat.   Eyes: Negative for visual disturbance.  Respiratory: Negative for cough and shortness of breath.    Cardiovascular: Positive for chest pain. Negative for leg swelling.  Gastrointestinal: Negative for abdominal pain, diarrhea, nausea and vomiting.  Genitourinary: Negative for dysuria.  Musculoskeletal: Negative for back pain and neck pain.  Skin: Negative for rash.  Neurological: Negative for dizziness, light-headedness and headaches.  Hematological: Does not bruise/bleed easily.  Psychiatric/Behavioral: Negative for confusion.    Physical Exam Updated Vital Signs BP 118/77   Pulse 64   Temp 98.7 F (37.1 C) (Oral)   Resp 18   SpO2 97%   Physical Exam Vitals and nursing note reviewed.  Constitutional:      Appearance: Normal appearance. He is well-developed.  HENT:     Head: Normocephalic and atraumatic.  Eyes:     Conjunctiva/sclera: Conjunctivae normal.     Pupils: Pupils are equal, round, and reactive to light.  Cardiovascular:     Rate and Rhythm: Normal rate and regular rhythm.     Heart sounds: No murmur heard.   Pulmonary:     Effort: Pulmonary effort is normal. No respiratory distress.     Breath sounds: Normal breath sounds.  Abdominal:     Palpations: Abdomen is soft.     Tenderness: There is no abdominal tenderness.  Musculoskeletal:        General: No swelling. Normal range of motion.     Cervical back: Normal range of motion and neck supple.  Skin:    General: Skin is warm and dry.  Neurological:     General: No focal deficit present.     Mental Status: He is alert and oriented to person, place, and time.     Cranial Nerves: No cranial nerve deficit.     Sensory: No sensory deficit.     Motor: No weakness.     ED Results / Procedures / Treatments   Labs (all labs ordered are listed, but only abnormal results are displayed) Labs Reviewed  CBC WITH DIFFERENTIAL/PLATELET - Abnormal; Notable for the following components:      Result Value   WBC 11.3 (*)    RBC 3.06 (*)    Hemoglobin 10.3 (*)    HCT 28.5 (*)    MCHC 36.1 (*)    All other  components within normal limits  RETICULOCYTES - Abnormal; Notable for the following components:   RBC. 3.07 (*)    Immature Retic Fract 21.3 (*)    All other components within normal limits  COMPREHENSIVE METABOLIC PANEL - Abnormal; Notable for the following components:   Total Protein 8.3 (*)    Total Bilirubin 1.7 (*)    All other components  within normal limits  RESP PANEL BY RT-PCR (FLU A&B, COVID) [id number]  URINALYSIS, ROUTINE W REFLEX MICROSCOPIC  D-DIMER, QUANTITATIVE  CBG MONITORING, ED    EKG EKG Interpretation  Date/Time:  Friday Sep 06 2020 14:40:00 EDT Ventricular Rate:  66 PR Interval:  153 QRS Duration: 92 QT Interval:  406 QTC Calculation: 426 R Axis:   29 Text Interpretation: Sinus rhythm Confirmed by Fredia Sorrow [phone]) on 09/06/2020 4:15:55 PM   Radiology DG Chest Port 1 View  Result Date: 09/06/2020 CLINICAL DATA:  Chest pain sickle cell EXAM: PORTABLE CHEST 1 VIEW COMPARISON:  12/28/2018, CT chest 12/28/2018 FINDINGS: The heart size and mediastinal contours are within normal limits. Both lungs are clear. The visualized skeletal structures are unremarkable. IMPRESSION: No active disease. Electronically Signed   By: Donavan Foil M.D.   On: 09/06/2020 15:21    Procedures Procedures   CRITICAL CARE Performed by: Fredia Sorrow Total critical care time: 45 minutes Critical care time was exclusive of separately billable procedures and treating other patients. Critical care was necessary to treat or prevent imminent or life-threatening deterioration. Critical care was time spent personally by me on the following activities: development of treatment plan with patient and/or surrogate as well as nursing, discussions with consultants, evaluation of patient's response to treatment, examination of patient, obtaining history from patient or surrogate, ordering and performing treatments and interventions, ordering and review of laboratory studies, ordering and  review of radiographic studies, pulse oximetry and re-evaluation of patient's condition.   Medications Ordered in ED Medications  dextrose 5 %-0.45 % sodium chloride infusion ( Intravenous New Bag/Given 09/06/20 1456)  ondansetron (ZOFRAN) injection 4 mg (4 mg Intravenous Given 09/06/20 1546)  HYDROmorphone (DILAUDID) injection 2 mg (2 mg Intravenous Given 09/06/20 1454)  HYDROmorphone (DILAUDID) injection 2 mg (2 mg Intravenous Given 09/06/20 1514)  diphenhydrAMINE (BENADRYL) injection 25 mg (25 mg Intravenous Given 09/06/20 1547)  HYDROmorphone (DILAUDID) injection 2 mg (2 mg Intravenous Given 09/06/20 1621)  HYDROmorphone (DILAUDID) injection 1 mg (1 mg Intravenous Given 09/06/20 1804)    ED Course  I have reviewed the triage vital signs and the nursing notes.  Pertinent labs & imaging results that were available during my care of the patient were reviewed by me and considered in my medical decision making (see chart for details).    MDM Rules/Calculators/A&P                          Difficulty getting patient's pain under control.  Still pretty much 8 out of 10.  Chest x-ray negative.  D-dimer elevated at 3.09.  But patient's had D-dimers in that range before with negative CT angios chest.  Due to the dye shortage.  And the fact that his not tachycardic not hypoxic we will hold on the CT angio at this point in time.  COVID testing negative.  Retaking here today.  Hematocrit down some.  But hemoglobin is up some.  May be hemoconcentrated.  Mild leukocytosis at 11.3.  Her lites without any significant abnormalities other than a total bili of 1.7.  Did do CT chest which showed no acute process.  This was due to the pain.  Portable chest x-ray was also negative.  EKG had no acute findings.  Discussed with hospitalist they will admit for sickle cell crisis.  In addition patient's COVID and influenza testing was negative.   Final Clinical Impression(s) / ED Diagnoses Final diagnoses:  Sickle  cell pain crisis (  [place])  Acute chest syndrome [facility])    Rx / DC Orders ED Discharge Orders    None       Fredia Sorrow, MD 09/06/20 2012

## 2020-09-06 NOTE — ED Triage Notes (Signed)
Coming from home, sickle cell pain in chest, ribs, back for 1 week, took morphine, motrin, phenergan and benadryl at home, minimal relief

## 2020-09-06 NOTE — H&P (Signed)
History and Physical   [name]:[id number] DOB: June 06, 1975 DOA: 09/06/2020  PCP: Nolene Ebbs, MD   Patient coming from: Home  Chief Complaint: Pain  HPI: [name] is a 45 y.o. male with medical history significant of sickle cell disease who presents with 1 week of pain.  Patient states that he has about 1 week of pain in his ribs and back similar to previous sickle cell crisis.  He has tried his home morphine, Motrin, Phenergan and Benadryl with only minimal relief.  Continues to have pain when seen despite pain medication in ED.  ED Course: Vital signs in ED were stable.  Lab work-up showed CMP with protein 8.3, T bili 1.7.  CBC with leukocytosis to 11.3 which is chronic and stable, anemia at 10.3 which is stable.  D-dimer 3.09, respiratory panel flu COVID-negative, reticulocyte count surprisingly not elevated.  Urinalysis pending.  Chest x-ray without acute abnormality, CT chest without acute abnormality but did show chronic scarring.  Patient received multiple doses of pain medication in the ED.  He also was started on IV fluids and given a dose of Benadryl and Zofran.  Review of Systems: As per HPI otherwise all other systems reviewed and are negative.  Past Medical History:  Diagnosis Date  . 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 Avascular necrosis of hip, left ([place]) 08/27/2011  . Blood transfusion   . Infection of bone, shoulder region ([place])    left shoulder  . Pneumonia   . Sickle cell crisis [name])     Past Surgical History:  Procedure Laterality Date  . BONE GRAFT HIP ILIAC CREST    . JOINT REPLACEMENT  2006   right total hip arthroplasty  . Orif right hip fracture  1995    Social History  reports that he has been smoking cigarettes. He has been smoking about 0.50 packs per day. He has never used smokeless tobacco. He reports that he does not drink alcohol and does not use drugs.  No Known Allergies  Family History  Adopted: Yes  Reviewed  on admission  Prior to Admission medications   Medication Sig Start Date End Date Taking? Authorizing Provider  diphenhydrAMINE (BENADRYL) 25 MG tablet Take 25 mg by mouth every 6 (six) hours as needed for itching.   Yes [provider]  folic acid (FOLVITE) 1 MG tablet Take 1 mg by mouth daily.   Yes [provider]  hydroxyurea (HYDREA) 500 MG capsule Take 500 mg by mouth 2 (two) times daily. May take with food to minimize GI side effects.   Yes [provider]  ibuprofen (ADVIL,MOTRIN) 800 MG tablet Take 800 mg by mouth every 8 (eight) hours as needed for moderate pain.  09/07/16  Yes [provider]  morphine (MSIR) 30 MG tablet Take 1 tablet (30 mg total) by mouth daily as needed for severe pain (pain). Resume after completing dilaudid. 02/17/16  Yes Leana Gamer, MD  Morphine Sulfate ER 15 MG TBEA Take 45 mg by mouth 2 (two) times daily as needed (pain).   Yes [provider]  MOVANTIK 25 MG TABS tablet Take 25 mg by mouth daily. 12/17/17  Yes [provider]  NARCAN 4 MG/0.1ML LIQD nasal spray kit Place 4 mg into the nose once as needed (overdose). 12/16/17  Yes [provider]  promethazine (PHENERGAN) 25 MG tablet Take 25 mg by mouth every 6 (six) hours as needed for nausea.  Yes [provider]  tiZANidine (ZANAFLEX) 4 MG tablet Take 4 mg by mouth daily as needed for muscle spasms. 09/05/20  Yes [provider]  Vitamin D, Ergocalciferol, (DRISDOL) 1.25 MG (50000 UNIT) CAPS capsule Take 1 capsule by mouth once a week. Sundays 07/31/20  Yes [provider]    Physical Exam: Vitals:   09/06/20 1730 09/06/20 1800 09/06/20 1855 09/06/20 1930  BP: 120/78 118/77 116/89 121/86  Pulse: 62 64 65 (!) 58  Resp: 14 18 17 14   Temp:      TempSrc:      SpO2: 97% 97% 95% 94%   Physical Exam Constitutional:      General: He is not in acute distress.    Appearance: Normal appearance.  HENT:     Head:  Normocephalic and atraumatic.     Mouth/Throat:     Mouth: Mucous membranes are moist.     Pharynx: Oropharynx is clear.  Eyes:     Extraocular Movements: Extraocular movements intact.     Pupils: Pupils are equal, round, and reactive to light.  Cardiovascular:     Rate and Rhythm: Normal rate and regular rhythm.     Pulses: Normal pulses.     Heart sounds: Normal heart sounds.  Pulmonary:     Effort: Pulmonary effort is normal. No respiratory distress.     Breath sounds: Normal breath sounds.  Abdominal:     General: Bowel sounds are normal. There is no distension.     Palpations: Abdomen is soft.     Tenderness: There is no abdominal tenderness.  Musculoskeletal:        General: No swelling or deformity.     Comments: Chest wall tenderness  Skin:    General: Skin is warm and dry.  Neurological:     General: No focal deficit present.     Mental Status: Mental status is at baseline.    Labs on Admission: I have personally reviewed following labs and imaging studies  CBC: Recent Labs  Lab 09/06/20 1422  WBC 11.3*  NEUTROABS 7.7  HGB 10.3*  HCT 28.5*  MCV 93.1  PLT 340    Basic Metabolic Panel: Recent Labs  Lab 09/06/20 1422  NA 137  K 3.9  CL 104  CO2 27  GLUCOSE 74  BUN 12  CREATININE 0.75  CALCIUM 9.4    GFR: CrCl cannot be calculated (Unknown ideal weight.).  Liver Function Tests: Recent Labs  Lab 09/06/20 1422  AST 23  ALT 12  ALKPHOS 85  BILITOT 1.7*  PROT 8.3*  ALBUMIN 4.6    Urine analysis:    Component Value Date/Time   COLORURINE YELLOW 12/28/2018 1831   APPEARANCEUR CLEAR 12/28/2018 1831   LABSPEC >1.046 (H) 12/28/2018 1831   PHURINE 5.0 12/28/2018 1831   GLUCOSEU NEGATIVE 12/28/2018 1831   HGBUR NEGATIVE 12/28/2018 1831   BILIRUBINUR NEGATIVE 12/28/2018 1831   KETONESUR NEGATIVE 12/28/2018 1831   PROTEINUR NEGATIVE 12/28/2018 1831   UROBILINOGEN 0.2 10/25/2013 0020   NITRITE NEGATIVE 12/28/2018 1831   LEUKOCYTESUR NEGATIVE  12/28/2018 1831    Radiological Exams on Admission: CT Chest Wo Contrast  Result Date: 09/06/2020 CLINICAL DATA:  Sickle cell crisis. EXAM: CT CHEST WITHOUT CONTRAST TECHNIQUE: Multidetector CT imaging of the chest was performed following the standard protocol without IV contrast. COMPARISON:  Chest x-ray from same day. CTA chest dated December 28, 2018. FINDINGS: Cardiovascular: No significant vascular findings. Normal heart size. No pericardial effusion. No thoracic aortic aneurysm. Mediastinum/Nodes:  Multiple chronic subcentimeter and borderline enlarged mediastinal lymph nodes, likely reactive. No enlarged axillary lymph nodes. The thyroid gland, trachea, and esophagus demonstrate no significant findings. Lungs/Pleura: No focal consolidation, pleural effusion, or pneumothorax. Chronic pleuroparenchymal scarring and architectural distortion in both lungs, worse in the right lower lobe, similar to prior study. Unchanged centrilobular and paraseptal emphysema. Upper Abdomen: No acute abnormality. Musculoskeletal: Patchy sclerosis of the visualized osseous structures, not significantly changed. Chronic bilateral humeral head avascular necrosis. IMPRESSION: 1. No acute intrathoracic process. Chronic post infectious/postinflammatory pulmonary scarring. 2. Unchanged patchy sclerosis of the visualized osseous structures, consistent with sickle cell disease. 3. Emphysema (ICD10-J43.9). Electronically Signed   By: Titus Dubin M.D.   On: 09/06/2020 19:21   DG Chest Port 1 View  Result Date: 09/06/2020 CLINICAL DATA:  Chest pain sickle cell EXAM: PORTABLE CHEST 1 VIEW COMPARISON:  12/28/2018, CT chest 12/28/2018 FINDINGS: The heart size and mediastinal contours are within normal limits. Both lungs are clear. The visualized skeletal structures are unremarkable. IMPRESSION: No active disease. Electronically Signed   By: Donavan Foil M.D.   On: 09/06/2020 15:21   EKG: Independently reviewed.  Sinus rhythm at  66 bpm.  Similar to previous.  Assessment/Plan Active Problems:   Sickle cell pain crisis (HCC)  Sickle cell pain crisis > Patient presenting with 1 week of pain worse in his right chest.  Similar to previous pain crisis. > Not responding to home morphine, Motrin, Phenergan, Benadryl > Chest x-ray without acute normality, CT chest without contrast without acute abnormality.  CTA chest not performed despite elevated D-dimer given history of similarly elevated D-dimer with normal CTA chest and contrast orders.  Also currently not hypoxic nor tachycardic. > Hemoglobin stable at 10.3.  Reticulocytes surprisingly not elevated.  Urinalysis pending. - Monitor on continuous pulse ox - Start PCA per protocol with loading dose of 1.5 mg, patient dose of 0.5 mg, max dose in 1 hour for milligrams and lockout period of 10 minutes. - Toradol every 6 hours - Continue continue with hydroxyurea - Phenergan and Benadryl - O2 if needed - Trend CBC  Chronic pain - Pain control as above  Leukocytosis > Mild leukocytosis to 11.3 which is chronic and stable for him.  No other evidence of infection. - Trend CBC  DVT prophylaxis: Lovenox  Code Status:   Full  Family Communication:  Not on admission  Disposition Plan:   Patient is from:  Home  Anticipated DC to:  Home  Anticipated DC date:  1 to 2 days  Anticipated DC barriers: None  Consults called:  None  Admission status:  Observation, MedSurg with continuous pulse ox   Severity of Illness: The appropriate patient status for this patient is OBSERVATION. Observation status is judged to be reasonable and necessary in order to provide the required intensity of service to ensure the patient's safety. The patient's presenting symptoms, physical exam findings, and initial radiographic and laboratory data in the context of their medical condition is felt to place them at decreased risk for further clinical deterioration. Furthermore, it is anticipated that  the patient will be medically stable for discharge from the [name] within 2 midnights of admission. The following factors support the patient status of observation.   " The patient's presenting symptoms include chest wall back pain. " The physical exam findings include chest wall and back tenderness. " The initial radiographic and laboratory data are hemoglobin 10.3, leukocytosis to 11.3, D-dimer 3.9, normal reticulocyte count, CT chest with no acute abnormality  and chronic scarring.   Marcelyn Bruins MD [facility]  How to contact the [facility] Attending or Consulting provider Lydia or covering provider during after hours [place], for this patient?   1. Check the care team in [facility] and look for a) attending/consulting TR[facility] provider listed and b) the [facility] team listed 2. Log into [email or web address] and use [place]'s universal password to access. If you do not have the password, please contact the [name] operator. 3. Locate the [employer] provider you are looking for under [facility] and page to a number that you can be directly reached. 4. If you still have difficulty reaching the provider, please page the [facility] (Director on Call) for the Hospitalists listed on amion for assistance.  09/06/2020, 8:15 PM

## 2020-09-07 DIAGNOSIS — M879 Osteonecrosis, unspecified: Secondary | ICD-10-CM | POA: Diagnosis present

## 2020-09-07 DIAGNOSIS — Z20822 Contact with and (suspected) exposure to covid-19: Secondary | ICD-10-CM | POA: Diagnosis present

## 2020-09-07 DIAGNOSIS — F17210 Nicotine dependence, cigarettes, uncomplicated: Secondary | ICD-10-CM | POA: Diagnosis present

## 2020-09-07 DIAGNOSIS — G894 Chronic pain syndrome: Secondary | ICD-10-CM | POA: Diagnosis present

## 2020-09-07 DIAGNOSIS — D5700 Hb-SS disease with crisis, unspecified: Secondary | ICD-10-CM | POA: Diagnosis present

## 2020-09-07 DIAGNOSIS — Z96641 Presence of right artificial hip joint: Secondary | ICD-10-CM | POA: Diagnosis present

## 2020-09-07 DIAGNOSIS — Z79899 Other long term (current) drug therapy: Secondary | ICD-10-CM | POA: Diagnosis not present

## 2020-09-07 MED ORDER — HYDROMORPHONE HCL 1 MG/ML IJ SOLN
0.5000 mg | Freq: Once | INTRAMUSCULAR | Status: AC
Start: 2020-09-07 — End: 2020-09-07
  Administered 2020-09-07: 0.5 mg via INTRAVENOUS
  Filled 2020-09-07: qty 0.5

## 2020-09-07 NOTE — Progress Notes (Signed)
[name]  DGL:[id number] DOB: 08-28-1975 DOA: 09/06/2020 PCP: Fleet Contras, MD   Brief Narrative:  This 45 years old male with PMH significant for sickle cell disease presents in the ED with 1 week history of pain in his ribs and back similar to previous sickle cell crisis episodes.  He has tried home morphine, Motrin, Phenergan and Benadryl with partial relief.  He continues to have pain despite pain medications in the ED. Work-up in the ED shows leukocytosis 11.3 which is chronic and stable, anemia 10.3 which is  stable D-dimer 3.09, COVID-negative. CT chest without any acute abnormality but did show chronic scarring.  Patient has received multiple doses of pain medications in the ED.  Patient is placed on morphine PCA for pain control.  Assessment & Plan:   Active Problems:   Sickle cell pain crisis (HCC)  Sickle cell crisis pain: Patient presented with 1 week history of worsening pain in his right chest similar to previous pain crisis. Not responding to home morphine, Motrin, Phenergan and Benadryl. Chest x-ray without any acute abnormality.  CT chest without contrast showed no acute abnormality. CTA chest not performed despite elevated D-dimer given history of similarly elevated D-dimer with normal CTA chest in the recent past and shortage of contrast . Patient is currently not hypoxic and not tachycardic. Monitor H&H,  reticulocyte not elevated. Patient is started on  PCA protocol with Dilaudid , states pain is better controlled. Toradol every 6 hours as needed,  continue hydroxyurea  Continue supplemental oxygen if needed.   DVT prophylaxis: Lovenox. Code Status: Full code. Family Communication: No family at bed side. Disposition Plan:   Status is: Inpatient  Remains inpatient appropriate because:Inpatient level of care appropriate due to severity of illness   Dispo: The patient is from: Home              Anticipated d/c is to: Home               Patient currently is not medically stable to d/c.   Difficult to place patient No   Consultants:   None.  Procedures:  Antimicrobials:  Anti-infectives (From admission, onward)   None     Subjective: Patient was seen and examined at bedside.  Overnight events noted.   Patient reports pain is better controlled with PCA.  Objective: Vitals:   09/07/20 0739 09/07/20 0852 09/07/20 1225 09/07/20 1400  BP:  106/74  113/70  Pulse:  (!) 57  60  Resp: 17 20 19 18   Temp:  98.4 F (36.9 C)  97.7 F (36.5 C)  TempSrc:  Oral  Oral  SpO2: 93% 98% 98% 97%  Weight:        Intake/Output Summary (Last 24 hours) at 09/07/2020 1404 Last data filed at 09/07/2020 1040 Gross per 24 hour  Intake 2296.36 ml  Output 1200 ml  Net 1096.36 ml   Filed Weights   09/06/20 2058  Weight: 74.7 kg    Examination:  General exam: Appears calm and comfortable , not in any acute distress. Respiratory system: Clear to auscultation. Respiratory effort normal. Cardiovascular system: S1 & S2 heard, RRR. No JVD, murmurs, rubs, gallops or clicks. No pedal edema. Gastrointestinal system: Abdomen is nondistended, soft and nontender. No organomegaly or masses felt. Normal bowel sounds heard. Central nervous system: Alert and oriented. No focal neurological deficits. Extremities: Symmetric 5 x 5 power.  No edema, no cyanosis, no clubbing. Skin: No rashes, lesions or ulcers Psychiatry:  Judgement and insight appear normal. Mood & affect appropriate.     Data Reviewed: I have personally reviewed following labs and imaging studies  CBC: Recent Labs  Lab 09/06/20 1422  WBC 11.3*  NEUTROABS 7.7  HGB 10.3*  HCT 28.5*  MCV 93.1  PLT 341   Basic Metabolic Panel: Recent Labs  Lab 09/06/20 1422  NA 137  K 3.9  CL 104  CO2 27  GLUCOSE 74  BUN 12  CREATININE 0.75  CALCIUM 9.4   GFR: CrCl cannot be calculated (Unknown ideal weight.). Liver Function Tests: Recent Labs  Lab 09/06/20 1422  AST 23   ALT 12  ALKPHOS 85  BILITOT 1.7*  PROT 8.3*  ALBUMIN 4.6   No results for input(s): LIPASE, AMYLASE in the last 168 hours. No results for input(s): AMMONIA in the last 168 hours. Coagulation Profile: No results for input(s): INR, PROTIME in the last 168 hours. Cardiac Enzymes: No results for input(s): CKTOTAL, CKMB, CKMBINDEX, TROPONINI in the last 168 hours. BNP (last 3 results) No results for input(s): PROBNP in the last 8760 hours. HbA1C: No results for input(s): HGBA1C in the last 72 hours. CBG: Recent Labs  Lab 09/06/20 1513  GLUCAP 71   Lipid Profile: No results for input(s): CHOL, HDL, LDLCALC, TRIG, CHOLHDL, LDLDIRECT in the last 72 hours. Thyroid Function Tests: No results for input(s): TSH, T4TOTAL, FREET4, T3FREE, THYROIDAB in the last 72 hours. Anemia Panel: Recent Labs    09/06/20 1422  RETICCTPCT 2.2   Sepsis Labs: No results for input(s): PROCALCITON, LATICACIDVEN in the last 168 hours.  Recent Results (from the past 240 hour(s))  Resp Panel by RT-PCR (Flu A&B, Covid) Nasopharyngeal Swab     Status: None   Collection Time: 09/06/20  3:48 PM   Specimen: Nasopharyngeal Swab; Nasopharyngeal(NP) swabs in vial transport medium  Result Value Ref Range Status   SARS Coronavirus 2 by RT PCR NEGATIVE NEGATIVE Final    Comment: (NOTE) SARS-CoV-2 target nucleic acids are NOT DETECTED.  The SARS-CoV-2 RNA is generally detectable in upper respiratory specimens during the acute phase of infection. The lowest concentration of SARS-CoV-2 viral copies this assay can detect is 138 copies/mL. A negative result does not preclude SARS-Cov-2 infection and should not be used as the sole basis for treatment or other patient management decisions. A negative result may occur with  improper specimen collection/handling, submission of specimen other than nasopharyngeal swab, presence of viral mutation(s) within the areas targeted by this assay, and inadequate number of  viral copies(<138 copies/mL). A negative result must be combined with clinical observations, patient history, and epidemiological information. The expected result is Negative.  Fact Sheet for Patients:  BloggerCourse.com  Fact Sheet for Healthcare Providers:  SeriousBroker.it  This test is no t yet approved or cleared by the [place] FDA and  has been authorized for detection and/or diagnosis of SARS-CoV-2 by FDA under an Emergency Use Authorization (EUA). This EUA will remain  in effect (meaning this test can be used) for the duration of the COVID-19 declaration under Section 564(b)(1) of the Act, 21 U.S.C.section 360bbb-3(b)(1), unless the authorization is terminated  or revoked sooner.       Influenza A by PCR NEGATIVE NEGATIVE Final   Influenza B by PCR NEGATIVE NEGATIVE Final    Comment: (NOTE) The Xpert Xpress SARS-CoV-2/FLU/RSV plus assay is intended as an aid in the diagnosis of influenza from Nasopharyngeal swab specimens and should not be used as a sole basis for treatment. Nasal  washings and aspirates are unacceptable for Xpert Xpress SARS-CoV-2/FLU/RSV testing.  Fact Sheet for Patients: BloggerCourse.com  Fact Sheet for Healthcare Providers: SeriousBroker.it  This test is not yet approved or cleared by the [place] FDA and has been authorized for detection and/or diagnosis of SARS-CoV-2 by FDA under an Emergency Use Authorization (EUA). This EUA will remain in effect (meaning this test can be used) for the duration of the COVID-19 declaration under Section 564(b)(1) of the Act, 21 U.S.C. section 360bbb-3(b)(1), unless the authorization is terminated or revoked.  Performed at [facility], [address] [place], [place] [address]     Radiology Studies: CT Chest Wo Contrast  Result Date: 09/06/2020 CLINICAL DATA:  Sickle cell  crisis. EXAM: CT CHEST WITHOUT CONTRAST TECHNIQUE: Multidetector CT imaging of the chest was performed following the standard protocol without IV contrast. COMPARISON:  Chest x-ray from same day. CTA chest dated December 28, 2018. FINDINGS: Cardiovascular: No significant vascular findings. Normal heart size. No pericardial effusion. No thoracic aortic aneurysm. Mediastinum/Nodes: Multiple chronic subcentimeter and borderline enlarged mediastinal lymph nodes, likely reactive. No enlarged axillary lymph nodes. The thyroid gland, trachea, and esophagus demonstrate no significant findings. Lungs/Pleura: No focal consolidation, pleural effusion, or pneumothorax. Chronic pleuroparenchymal scarring and architectural distortion in both lungs, worse in the right lower lobe, similar to prior study. Unchanged centrilobular and paraseptal emphysema. Upper Abdomen: No acute abnormality. Musculoskeletal: Patchy sclerosis of the visualized osseous structures, not significantly changed. Chronic bilateral humeral head avascular necrosis. IMPRESSION: 1. No acute intrathoracic process. Chronic post infectious/postinflammatory pulmonary scarring. 2. Unchanged patchy sclerosis of the visualized osseous structures, consistent with sickle cell disease. 3. Emphysema (ICD10-J43.9). Electronically Signed   By: Obie Dredge M.D.   On: 09/06/2020 19:21   DG Chest Port 1 View  Result Date: 09/06/2020 CLINICAL DATA:  Chest pain sickle cell EXAM: PORTABLE CHEST 1 VIEW COMPARISON:  12/28/2018, CT chest 12/28/2018 FINDINGS: The heart size and mediastinal contours are within normal limits. Both lungs are clear. The visualized skeletal structures are unremarkable. IMPRESSION: No active disease. Electronically Signed   By: Jasmine Pang M.D.   On: 09/06/2020 15:21   Scheduled Meds: . enoxaparin (LOVENOX) injection  40 mg Subcutaneous Q24H  . folic acid  1 mg Oral Daily  . HYDROmorphone   Intravenous Q4H  . hydroxyurea  500 mg Oral BID  .  ketorolac  15 mg Intravenous Q6H  . naloxegol oxalate  25 mg Oral Daily  . senna-docusate  1 tablet Oral BID   Continuous Infusions: . dextrose 5 % and 0.45% NaCl 100 mL/hr at 09/07/20 0852  . diphenhydrAMINE Stopped (09/07/20 0053)     LOS: 0 days    Time spent: 35 mins    Jayten Gabbard, MD [facility]   If 7PM-7AM, please contact night-coverage

## 2020-09-08 LAB — HIV ANTIBODY (ROUTINE TESTING W REFLEX): HIV Screen 4th Generation wRfx: NONREACTIVE

## 2020-09-08 NOTE — Progress Notes (Signed)
PROGRESS NOTE    [name]  IOX:[id number] DOB: 25-Oct-1975 DOA: 09/06/2020 PCP: Fleet Contras, MD   Brief Narrative:  This 45 years old male with PMH significant for sickle cell disease presents in the ED with 1 week history of pain in his ribs and back similar to previous sickle cell crisis episodes.  He has tried home morphine, Motrin, Phenergan and Benadryl with partial relief.  He continues to have pain despite pain medications in the ED. Work-up in the ED shows leukocytosis 11.3 which is chronic and stable, anemia 10.3 which is  stable D-dimer 3.09, COVID-negative. CT chest without any acute abnormality but did show chronic scarring.  Patient has received multiple doses of pain medications in the ED.  Patient is placed on Dilaudid PCA for pain control.  Assessment & Plan:   Active Problems:   Sickle cell pain crisis (HCC)  Sickle cell crisis pain: Patient presented with 1 week history of worsening pain in his right chest similar to previous pain crisis. Not responding to home morphine, Motrin, Phenergan and Benadryl. Chest x-ray without any acute abnormality.  CT chest without contrast showed no acute abnormality. CTA chest not performed despite elevated D-dimer given history of similarly elevated D-dimer with normal CTA chest in the recent past and shortage of contrast . Patient is currently not hypoxic and not tachycardic. Monitor H&H,  reticulocyte not elevated. Patient is started on PCA protocol with Dilaudid , states pain is better controlled. Toradol every 6 hours as needed,  continue hydroxyurea  Continue supplemental oxygen if needed. EKG normal sinus rhythm, no ST-T wave changes.   DVT prophylaxis: Lovenox. Code Status: Full code. Family Communication: No family at bed side. Disposition Plan:   Status is: Inpatient  Remains inpatient appropriate because:Inpatient level of care appropriate due to severity of illness   Dispo: The patient is from: Home               Anticipated d/c is to: Home              Patient currently is not medically stable to d/c.   Difficult to place patient No   Consultants:   None.  Procedures:  Antimicrobials:  Anti-infectives (From admission, onward)   None     Subjective: Patient was seen and examined at bedside.  Overnight events noted.  RN reported patient having right-sided chest pain last night EKG was completed which was normal sinus rhythm. Patient reports pain is better controlled with PCA.  Objective: Vitals:   09/08/20 0754 09/08/20 1018 09/08/20 1154 09/08/20 1430  BP:  125/62    Pulse:  (!) 53    Resp: 14 14 15 11   Temp:  97.7 F (36.5 C)    TempSrc:  Oral    SpO2: 100% 98% 96% 97%  Weight:        Intake/Output Summary (Last 24 hours) at 09/08/2020 1533 Last data filed at 09/08/2020 1018 Gross per 24 hour  Intake 1726.42 ml  Output 1825 ml  Net -98.58 ml   Filed Weights   09/06/20 2058 09/08/20 0544  Weight: 74.7 kg 81.2 kg    Examination:  General exam: Appears calm and comfortable , not in any acute distress. Respiratory system: Clear to auscultation. Respiratory effort normal. Cardiovascular system: S1 & S2 heard, RRR. No JVD, murmurs, rubs, gallops or clicks. No pedal edema. Gastrointestinal system: Abdomen is nondistended, soft and nontender. No organomegaly or masses felt. Normal bowel sounds heard. Central nervous system: Alert and oriented.  No focal neurological deficits. Extremities: Symmetric 5 x 5 power.  No edema, no cyanosis, no clubbing. Skin: No rashes, lesions or ulcers Psychiatry: Judgement and insight appear normal. Mood & affect appropriate.     Data Reviewed: I have personally reviewed following labs and imaging studies  CBC: Recent Labs  Lab 09/06/20 1422  WBC 11.3*  NEUTROABS 7.7  HGB 10.3*  HCT 28.5*  MCV 93.1  PLT 341   Basic Metabolic Panel: Recent Labs  Lab 09/06/20 1422  NA 137  K 3.9  CL 104  CO2 27  GLUCOSE 74  BUN 12   CREATININE 0.75  CALCIUM 9.4   GFR: CrCl cannot be calculated (Unknown ideal weight.). Liver Function Tests: Recent Labs  Lab 09/06/20 1422  AST 23  ALT 12  ALKPHOS 85  BILITOT 1.7*  PROT 8.3*  ALBUMIN 4.6   No results for input(s): LIPASE, AMYLASE in the last 168 hours. No results for input(s): AMMONIA in the last 168 hours. Coagulation Profile: No results for input(s): INR, PROTIME in the last 168 hours. Cardiac Enzymes: No results for input(s): CKTOTAL, CKMB, CKMBINDEX, TROPONINI in the last 168 hours. BNP (last 3 results) No results for input(s): PROBNP in the last 8760 hours. HbA1C: No results for input(s): HGBA1C in the last 72 hours. CBG: Recent Labs  Lab 09/06/20 1513  GLUCAP 71   Lipid Profile: No results for input(s): CHOL, HDL, LDLCALC, TRIG, CHOLHDL, LDLDIRECT in the last 72 hours. Thyroid Function Tests: No results for input(s): TSH, T4TOTAL, FREET4, T3FREE, THYROIDAB in the last 72 hours. Anemia Panel: Recent Labs    09/06/20 1422  RETICCTPCT 2.2   Sepsis Labs: No results for input(s): PROCALCITON, LATICACIDVEN in the last 168 hours.  Recent Results (from the past 240 hour(s))  Resp Panel by RT-PCR (Flu A&B, Covid) Nasopharyngeal Swab     Status: None   Collection Time: 09/06/20  3:48 PM   Specimen: Nasopharyngeal Swab; Nasopharyngeal(NP) swabs in vial transport medium  Result Value Ref Range Status   SARS Coronavirus 2 by RT PCR NEGATIVE NEGATIVE Final    Comment: (NOTE) SARS-CoV-2 target nucleic acids are NOT DETECTED.  The SARS-CoV-2 RNA is generally detectable in upper respiratory specimens during the acute phase of infection. The lowest concentration of SARS-CoV-2 viral copies this assay can detect is 138 copies/mL. A negative result does not preclude SARS-Cov-2 infection and should not be used as the sole basis for treatment or other patient management decisions. A negative result may occur with  improper specimen collection/handling,  submission of specimen other than nasopharyngeal swab, presence of viral mutation(s) within the areas targeted by this assay, and inadequate number of viral copies(<138 copies/mL). A negative result must be combined with clinical observations, patient history, and epidemiological information. The expected result is Negative.  Fact Sheet for Patients:  BloggerCourse.com  Fact Sheet for Healthcare Providers:  SeriousBroker.it  This test is no t yet approved or cleared by the [place] FDA and  has been authorized for detection and/or diagnosis of SARS-CoV-2 by FDA under an Emergency Use Authorization (EUA). This EUA will remain  in effect (meaning this test can be used) for the duration of the COVID-19 declaration under Section 564(b)(1) of the Act, 21 U.S.C.section 360bbb-3(b)(1), unless the authorization is terminated  or revoked sooner.       Influenza A by PCR NEGATIVE NEGATIVE Final   Influenza B by PCR NEGATIVE NEGATIVE Final    Comment: (NOTE) The Xpert Xpress SARS-CoV-2/FLU/RSV plus assay is intended  as an aid in the diagnosis of influenza from Nasopharyngeal swab specimens and should not be used as a sole basis for treatment. Nasal washings and aspirates are unacceptable for Xpert Xpress SARS-CoV-2/FLU/RSV testing.  Fact Sheet for Patients: BloggerCourse.com  Fact Sheet for Healthcare Providers: SeriousBroker.it  This test is not yet approved or cleared by the [place] FDA and has been authorized for detection and/or diagnosis of SARS-CoV-2 by FDA under an Emergency Use Authorization (EUA). This EUA will remain in effect (meaning this test can be used) for the duration of the COVID-19 declaration under Section 564(b)(1) of the Act, 21 U.S.C. section 360bbb-3(b)(1), unless the authorization is terminated or revoked.  Performed at [facility], [address] [place], [place] [address]     Radiology Studies: CT Chest Wo Contrast  Result Date: 09/06/2020 CLINICAL DATA:  Sickle cell crisis. EXAM: CT CHEST WITHOUT CONTRAST TECHNIQUE: Multidetector CT imaging of the chest was performed following the standard protocol without IV contrast. COMPARISON:  Chest x-ray from same day. CTA chest dated December 28, 2018. FINDINGS: Cardiovascular: No significant vascular findings. Normal heart size. No pericardial effusion. No thoracic aortic aneurysm. Mediastinum/Nodes: Multiple chronic subcentimeter and borderline enlarged mediastinal lymph nodes, likely reactive. No enlarged axillary lymph nodes. The thyroid gland, trachea, and esophagus demonstrate no significant findings. Lungs/Pleura: No focal consolidation, pleural effusion, or pneumothorax. Chronic pleuroparenchymal scarring and architectural distortion in both lungs, worse in the right lower lobe, similar to prior study. Unchanged centrilobular and paraseptal emphysema. Upper Abdomen: No acute abnormality. Musculoskeletal: Patchy sclerosis of the visualized osseous structures, not significantly changed. Chronic bilateral humeral head avascular necrosis. IMPRESSION: 1. No acute intrathoracic process. Chronic post infectious/postinflammatory pulmonary scarring. 2. Unchanged patchy sclerosis of the visualized osseous structures, consistent with sickle cell disease. 3. Emphysema (ICD10-J43.9). Electronically Signed   By: Obie Dredge M.D.   On: 09/06/2020 19:21   Scheduled Meds: . enoxaparin (LOVENOX) injection  40 mg Subcutaneous Q24H  . folic acid  1 mg Oral Daily  . HYDROmorphone   Intravenous Q4H  . hydroxyurea  500 mg Oral BID  . ketorolac  15 mg Intravenous Q6H  . naloxegol oxalate  25 mg Oral Daily  . senna-docusate  1 tablet Oral BID   Continuous Infusions: . dextrose 5 % and 0.45% NaCl 100 mL/hr at 09/08/20 0831  . diphenhydrAMINE Stopped (09/07/20 1818)     LOS: 1 day     Time spent: 25 mins    Glendi Mohiuddin, MD [facility]   If 7PM-7AM, please contact night-coverage

## 2020-09-08 NOTE — Progress Notes (Signed)
   09/08/20 2000  Assess: MEWS Score  Resp 12  Level of Consciousness Alert  SpO2 99 %  O2 Device Nasal Cannula  O2 Flow Rate (L/min) 2 L/min  Assess: MEWS Score  MEWS Temp 0  MEWS Systolic 0  MEWS Pulse 1  MEWS RR 1  MEWS LOC 0  MEWS Score 2  MEWS Score Color Yellow  Assess: if the MEWS score is Yellow or Red  Were vital signs taken at a resting state? Yes  Focused Assessment No change from prior assessment  Does the patient meet 2 or more of the SIRS criteria? No  MEWS guidelines implemented *See Row Information* No, vital signs rechecked  Treat  Pain Scale 0-10  Pain Score 8  Pain Type Acute pain  Pain Location Rib cage  Pain Orientation Right;Left;Mid  Pain Intervention(s) PCA encouraged  Assess: SIRS CRITERIA  SIRS Temperature  0  SIRS Pulse 0  SIRS Respirations  0  SIRS WBC 0  SIRS Score Sum  0  RR change noted on Tele, rechecked and pt stable. No distress noted, no need or change in MEWS. Will continue to monitor pt closely. Erle Crocker, RN

## 2020-09-09 DIAGNOSIS — D5700 Hb-SS disease with crisis, unspecified: Principal | ICD-10-CM

## 2020-09-09 LAB — CBC
HCT: 26.3 % — ABNORMAL LOW (ref 39.0–52.0)
Hemoglobin: 9.5 g/dL — ABNORMAL LOW (ref 13.0–17.0)
MCH: 34.2 pg — ABNORMAL HIGH (ref 26.0–34.0)
MCHC: 36.1 g/dL — ABNORMAL HIGH (ref 30.0–36.0)
MCV: 94.6 fL (ref 80.0–100.0)
Platelets: 283 10*3/uL (ref 150–400)
RBC: 2.78 MIL/uL — ABNORMAL LOW (ref 4.22–5.81)
RDW: 14.4 % (ref 11.5–15.5)
WBC: 11.9 10*3/uL — ABNORMAL HIGH (ref 4.0–10.5)
nRBC: 0 % (ref 0.0–0.2)

## 2020-09-09 NOTE — Plan of Care (Signed)
  Problem: Clinical Measurements: Goal: Ability to maintain clinical measurements within normal limits will improve Outcome: Progressing Goal: Respiratory complications will improve Outcome: Progressing Goal: Cardiovascular complication will be avoided Outcome: Progressing   Problem: Activity: Goal: Risk for activity intolerance will decrease Outcome: Progressing   

## 2020-09-09 NOTE — Discharge Summary (Signed)
Physician Discharge Summary  [name] WCH:[id number] DOB: [birth year] DOA: 09/06/2020  PCP: Nolene Ebbs, MD  Admit date: 09/06/2020  Discharge date: 09/09/2020  Discharge Diagnoses:  Principal Problem:   Sickle cell pain crisis ([place]) Active Problems:   Tobacco abuse   Chronic pain syndrome   Discharge Condition: Stable  Disposition:   Follow-up Information    Nolene Ebbs, MD Follow up.   Specialty: Internal Medicine Contact information: [place] [address] [phone]              Pt is discharged home in good condition and is to follow up with Nolene Ebbs, MD this week to have labs evaluated. [name] is instructed to increase activity slowly and balance with rest for the next few days, and use prescribed medication to complete treatment of pain  Diet: Regular Wt Readings from Last 3 Encounters:  09/09/20 80.5 kg  12/28/18 77.1 kg  04/29/18 78.2 kg    History of present illness:  [name] is a 45 year old male with a medical history significant for sickle cell disease that presents to the emergency department with pain that has been occurring for over a week.  Patient states that he has had about 1 week of pain in his ribs and back that similar previous sickle cell crisis.  He has attempted his home morphine, Motrin, Phenergan, and Benadryl with only minimal relief.  Continues to have pain despite pain medication in the ER.  ER course: Vital signs in ER were stable.  Lab work-up showed CMP with protein of 8.3, total bilirubin 1.7.  CBC with leukocytosis to 11.3, which is chronic and stable, anemia at 10.3, which is stable.  D-dimer 3.09.  Respiratory panel negative for flu and COVID-19.  Reticulocyte count surprisingly not elevated.  Urinalysis unremarkable.  Chest x-ray shows no acute abnormalities, CT of chest without acute abnormality but did show chronic scarring.  Patient received multiple doses of pain medication in the  ER.  He was also started on IV fluids and given a dose of Benadryl and Zofran.  Admitted to [facility] for further management of sickle cell crisis.  Hospital Course:  Sickle cell disease with pain crisis: Patient was admitted for sickle cell pain crisis and managed appropriately with IVF, IV Dilaudid via PCA and IV Toradol, as well as other adjunct therapies per sickle cell pain management protocols.  Home medications resumed.  Patient is followed by pain management, advised to follow-up as scheduled.  He states that he has home medications and feels that he can manage at home at this time.  His pain intensity is 4/10, which is much improved. Patient does not have any chest pains or shortness of breath.  He is afebrile. Patient will follow-up with PCP within 1 week to repeat CBC with differential and CMP. Patient is aware of all upcoming appointments. Patient was therefore discharged home today in a hemodynamically stable condition.   [name] will follow-up with PCP within 1 week of this discharge. [name] was counseled extensively about nonpharmacologic means of pain management, patient verbalized understanding and was appreciative of  the care received during this admission.   We discussed the need for good hydration, monitoring of hydration status, avoidance of heat, cold, stress, and infection triggers. We discussed the need to be adherent with taking Hydrea and other home medications. Patient was reminded of the need to seek medical attention immediately if any symptom of bleeding, anemia, or infection occurs.  Discharge Exam: Vitals:  09/09/20 0947 09/09/20 1222  BP: 128/74   Pulse: (!) 53   Resp: 14 16  Temp: 98.8 F (37.1 C)   SpO2: 99% 95%   Vitals:   09/09/20 0608 09/09/20 0856 09/09/20 0947 09/09/20 1222  BP: 127/78  128/74   Pulse: (!) 53  (!) 53   Resp: 16 15 14 16   Temp: 97.8 F (36.6 C)  98.8 F (37.1 C)   TempSrc: Oral  Oral   SpO2: 100% 94% 99% 95%  Weight: 80.5 kg        General appearance : Awake, alert, not in any distress. Speech Clear. Not toxic looking HEENT: Atraumatic and Normocephalic, pupils equally reactive to light and accomodation Neck: Supple, no JVD. No cervical lymphadenopathy.  Chest: Good air entry bilaterally, no added sounds  CVS: S1 S2 regular, no murmurs.  Abdomen: Bowel sounds present, Non tender and not distended with no guarding, rigidity or rebound. Extremities: B/L Lower Ext shows no edema, both legs are warm to touch Neurology: Awake alert, and oriented X 3, CN II-XII intact, Non focal Skin: No Rash  Discharge Instructions   Allergies as of 09/09/2020   No Known Allergies     Medication List    TAKE these medications   diphenhydrAMINE 25 MG tablet Commonly known as: BENADRYL Take 25 mg by mouth every 6 (six) hours as needed for itching.   folic acid 1 MG tablet Commonly known as: FOLVITE Take 1 mg by mouth daily.   hydroxyurea 500 MG capsule Commonly known as: HYDREA Take 500 mg by mouth 2 (two) times daily. May take with food to minimize GI side effects.   ibuprofen 800 MG tablet Commonly known as: ADVIL Take 800 mg by mouth every 8 (eight) hours as needed for moderate pain.   Morphine Sulfate ER 15 MG Tbea Take 45 mg by mouth 2 (two) times daily as needed (pain).   morphine 30 MG tablet Commonly known as: MSIR Take 1 tablet (30 mg total) by mouth daily as needed for severe pain (pain). Resume after completing dilaudid.   Movantik 25 MG Tabs tablet Generic drug: naloxegol oxalate Take 25 mg by mouth daily.   Narcan 4 MG/0.1ML Liqd nasal spray kit Generic drug: naloxone Place 4 mg into the nose once as needed (overdose).   promethazine 25 MG tablet Commonly known as: PHENERGAN Take 25 mg by mouth every 6 (six) hours as needed for nausea.   tiZANidine 4 MG tablet Commonly known as: ZANAFLEX Take 4 mg by mouth daily as needed for muscle spasms.   Vitamin D (Ergocalciferol) 1.25 MG (50000 UNIT)  Caps capsule Commonly known as: DRISDOL Take 1 capsule by mouth once a week. Sundays       The results of significant diagnostics from this hospitalization (including imaging, microbiology, ancillary and laboratory) are listed below for reference.    Significant Diagnostic Studies: CT Chest Wo Contrast  Result Date: 09/06/2020 CLINICAL DATA:  Sickle cell crisis. EXAM: CT CHEST WITHOUT CONTRAST TECHNIQUE: Multidetector CT imaging of the chest was performed following the standard protocol without IV contrast. COMPARISON:  Chest x-ray from same day. CTA chest dated December 28, 2018. FINDINGS: Cardiovascular: No significant vascular findings. Normal heart size. No pericardial effusion. No thoracic aortic aneurysm. Mediastinum/Nodes: Multiple chronic subcentimeter and borderline enlarged mediastinal lymph nodes, likely reactive. No enlarged axillary lymph nodes. The thyroid gland, trachea, and esophagus demonstrate no significant findings. Lungs/Pleura: No focal consolidation, pleural effusion, or pneumothorax. Chronic pleuroparenchymal scarring and architectural distortion in  both lungs, worse in the right lower lobe, similar to prior study. Unchanged centrilobular and paraseptal emphysema. Upper Abdomen: No acute abnormality. Musculoskeletal: Patchy sclerosis of the visualized osseous structures, not significantly changed. Chronic bilateral humeral head avascular necrosis. IMPRESSION: 1. No acute intrathoracic process. Chronic post infectious/postinflammatory pulmonary scarring. 2. Unchanged patchy sclerosis of the visualized osseous structures, consistent with sickle cell disease. 3. Emphysema (ICD10-J43.9). Electronically Signed   By: Titus Dubin M.D.   On: 09/06/2020 19:21   DG Chest Port 1 View  Result Date: 09/06/2020 CLINICAL DATA:  Chest pain sickle cell EXAM: PORTABLE CHEST 1 VIEW COMPARISON:  12/28/2018, CT chest 12/28/2018 FINDINGS: The heart size and mediastinal contours are within  normal limits. Both lungs are clear. The visualized skeletal structures are unremarkable. IMPRESSION: No active disease. Electronically Signed   By: Donavan Foil M.D.   On: 09/06/2020 15:21    Microbiology: Recent Results (from the past 240 hour(s))  Resp Panel by RT-PCR (Flu A&B, Covid) Nasopharyngeal Swab     Status: None   Collection Time: 09/06/20  3:48 PM   Specimen: Nasopharyngeal Swab; Nasopharyngeal(NP) swabs in vial transport medium  Result Value Ref Range Status   SARS Coronavirus 2 by RT PCR NEGATIVE NEGATIVE Final    Comment: (NOTE) SARS-CoV-2 target nucleic acids are NOT DETECTED.  The SARS-CoV-2 RNA is generally detectable in upper respiratory specimens during the acute phase of infection. The lowest concentration of SARS-CoV-2 viral copies this assay can detect is 138 copies/mL. A negative result does not preclude SARS-Cov-2 infection and should not be used as the sole basis for treatment or other patient management decisions. A negative result may occur with  improper specimen collection/handling, submission of specimen other than nasopharyngeal swab, presence of viral mutation(s) within the areas targeted by this assay, and inadequate number of viral copies(<138 copies/mL). A negative result must be combined with clinical observations, patient history, and epidemiological information. The expected result is Negative.  Fact Sheet for Patients:  EntrepreneurPulse.com.au  Fact Sheet for Healthcare Providers:  IncredibleEmployment.be  This test is no t yet approved or cleared by the [place] FDA and  has been authorized for detection and/or diagnosis of SARS-CoV-2 by FDA under an Emergency Use Authorization (EUA). This EUA will remain  in effect (meaning this test can be used) for the duration of the COVID-19 declaration under Section 564(b)(1) of the Act, 21 U.S.C.section 360bbb-3(b)(1), unless the authorization is terminated   or revoked sooner.       Influenza A by PCR NEGATIVE NEGATIVE Final   Influenza B by PCR NEGATIVE NEGATIVE Final    Comment: (NOTE) The Xpert Xpress SARS-CoV-2/FLU/RSV plus assay is intended as an aid in the diagnosis of influenza from Nasopharyngeal swab specimens and should not be used as a sole basis for treatment. Nasal washings and aspirates are unacceptable for Xpert Xpress SARS-CoV-2/FLU/RSV testing.  Fact Sheet for Patients: EntrepreneurPulse.com.au  Fact Sheet for Healthcare Providers: IncredibleEmployment.be  This test is not yet approved or cleared by the [place] FDA and has been authorized for detection and/or diagnosis of SARS-CoV-2 by FDA under an Emergency Use Authorization (EUA). This EUA will remain in effect (meaning this test can be used) for the duration of the COVID-19 declaration under Section 564(b)(1) of the Act, 21 U.S.C. section 360bbb-3(b)(1), unless the authorization is terminated or revoked.  Performed at [facility], [place] [address], [place],  [address]      Labs: Basic Metabolic Panel: Recent Labs  Lab 09/06/20  1422  NA 137  K 3.9  CL 104  CO2 27  GLUCOSE 74  BUN 12  CREATININE 0.75  CALCIUM 9.4   Liver Function Tests: Recent Labs  Lab 09/06/20 1422  AST 23  ALT 12  ALKPHOS 85  BILITOT 1.7*  PROT 8.3*  ALBUMIN 4.6   No results for input(s): LIPASE, AMYLASE in the last 168 hours. No results for input(s): AMMONIA in the last 168 hours. CBC: Recent Labs  Lab 09/06/20 1422 09/09/20 0500  WBC 11.3* 11.9*  NEUTROABS 7.7  --   HGB 10.3* 9.5*  HCT 28.5* 26.3*  MCV 93.1 94.6  PLT 341 283   Cardiac Enzymes: No results for input(s): CKTOTAL, CKMB, CKMBINDEX, TROPONINI in the last 168 hours. BNP: Invalid input(s): POCBNP CBG: Recent Labs  Lab 09/06/20 1513  GLUCAP 71    Time coordinating discharge: 35 minutes  Signed:  Donia Pounds   APRN, MSN, FNP-C [facility] [address] [place], [place] [address] [phone]  [facility] 09/09/2020, 5:31 PM

## 2020-09-09 NOTE — Discharge Instructions (Signed)
Sickle cell clinic M-F 8-4:30. [phone]   Sickle Cell Anemia, Adult  Sickle cell anemia is a condition where your red blood cells are shaped like sickles. Red blood cells carry oxygen through the body. Sickle-shaped cells do not live as long as normal red blood cells. They also clump together and block blood from flowing through the blood vessels. This prevents the body from getting enough oxygen. Sickle cell anemia causes organ damage and pain. It also increases the risk of infection. Follow these instructions at home: Medicines  Take over-the-counter and prescription medicines only as told by your doctor.  If you were prescribed an antibiotic medicine, take it as told by your doctor. Do not stop taking the antibiotic even if you start to feel better.  If you develop a fever, do not take medicines to lower the fever right away. Tell your doctor about the fever. Managing pain, stiffness, and swelling  Try these methods to help with pain: ? Use a heating pad. ? Take a warm bath. ? Distract yourself, such as by watching TV. Eating and drinking  Drink enough fluid to keep your pee (urine) clear or pale yellow. Drink more in hot weather and during exercise.  Limit or avoid alcohol.  Eat a healthy diet. Eat plenty of fruits, vegetables, whole grains, and lean protein.  Take vitamins and supplements as told by your doctor. Traveling  When traveling, keep these with you: ? Your medical information. ? The names of your doctors. ? Your medicines.  If you need to take an airplane, talk to your doctor first. Activity  Rest often.  Avoid exercises that make your heart beat much faster, such as jogging. General instructions  Do not use products that have nicotine or tobacco, such as cigarettes and e-cigarettes. If you need help quitting, ask your doctor.  Consider wearing a medical alert bracelet.  Avoid being in high places (high altitudes), such as mountains.  Avoid very  hot or cold temperatures.  Avoid places where the temperature changes a lot.  Keep all follow-up visits as told by your doctor. This is important. Contact a doctor if:  A joint hurts.  Your feet or hands hurt or swell.  You feel tired (fatigued). Get help right away if:  You have symptoms of infection. These include: ? Fever. ? Chills. ? Being very tired. ? Irritability. ? Poor eating. ? Throwing up (vomiting).  You feel dizzy or faint.  You have new stomach pain, especially on the left side.  You have a an erection (priapism) that lasts more than 4 hours.  You have numbness in your arms or legs.  You have a hard time moving your arms or legs.  You have trouble talking.  You have pain that does not go away when you take medicine.  You are short of breath.  You are breathing fast.  You have a long-term cough.  You have pain in your chest.  You have a bad headache.  You have a stiff neck.  Your stomach looks bloated even though you did not eat much.  Your skin is pale.  You suddenly cannot see well. Summary  Sickle cell anemia is a condition where your red blood cells are shaped like sickles.  Follow your doctor's advice on ways to manage pain, food to eat, activities to do, and steps to take for safe travel.  Get medical help right away if you have any signs of infection, such as a fever. This information is not  intended to replace advice given to you by your health care provider. Make sure you discuss any questions you have with your health care provider. Document Revised: 09/07/2019 Document Reviewed: 09/07/2019 Elsevier Patient Education  2021 [employer].

## 2020-10-07 ENCOUNTER — Encounter (HOSPITAL_COMMUNITY): Payer: Self-pay

## 2020-10-07 ENCOUNTER — Other Ambulatory Visit: Payer: Self-pay

## 2020-10-07 ENCOUNTER — Inpatient Hospital Stay (HOSPITAL_COMMUNITY)
Admission: EM | Admit: 2020-10-07 | Discharge: 2020-10-09 | DRG: 812 | Disposition: A | Discharge status: Home / Self Care | Payer: Medicare Other | Attending: Internal Medicine | Admitting: Internal Medicine

## 2020-10-07 DIAGNOSIS — D638 Anemia in other chronic diseases classified elsewhere: Secondary | ICD-10-CM | POA: Diagnosis present

## 2020-10-07 DIAGNOSIS — F1120 Opioid dependence, uncomplicated: Secondary | ICD-10-CM | POA: Diagnosis present

## 2020-10-07 DIAGNOSIS — Z87891 Personal history of nicotine dependence: Secondary | ICD-10-CM | POA: Diagnosis not present

## 2020-10-07 DIAGNOSIS — G894 Chronic pain syndrome: Secondary | ICD-10-CM | POA: Diagnosis not present

## 2020-10-07 DIAGNOSIS — D5700 Hb-SS disease with crisis, unspecified: Principal | ICD-10-CM | POA: Diagnosis present

## 2020-10-07 DIAGNOSIS — D72829 Elevated white blood cell count, unspecified: Secondary | ICD-10-CM | POA: Diagnosis present

## 2020-10-07 DIAGNOSIS — Z96641 Presence of right artificial hip joint: Secondary | ICD-10-CM | POA: Diagnosis present

## 2020-10-07 DIAGNOSIS — Z79899 Other long term (current) drug therapy: Secondary | ICD-10-CM

## 2020-10-07 DIAGNOSIS — Z720 Tobacco use: Secondary | ICD-10-CM | POA: Diagnosis present

## 2020-10-07 DIAGNOSIS — F17210 Nicotine dependence, cigarettes, uncomplicated: Secondary | ICD-10-CM | POA: Diagnosis present

## 2020-10-07 DIAGNOSIS — Z20822 Contact with and (suspected) exposure to covid-19: Secondary | ICD-10-CM | POA: Diagnosis present

## 2020-10-07 LAB — COMPREHENSIVE METABOLIC PANEL
ALT: 10 U/L (ref 0–44)
AST: 19 U/L (ref 15–41)
Albumin: 4.8 g/dL (ref 3.5–5.0)
Alkaline Phosphatase: 85 U/L (ref 38–126)
Anion gap: 8 (ref 5–15)
BUN: 13 mg/dL (ref 6–20)
CO2: 27 mmol/L (ref 22–32)
Calcium: 9.6 mg/dL (ref 8.9–10.3)
Chloride: 108 mmol/L (ref 98–111)
Creatinine, Ser: 0.92 mg/dL (ref 0.61–1.24)
GFR, Estimated: >60 mL/min (ref >60)
Glucose, Bld: 96 mg/dL (ref 70–99)
Potassium: 3.5 mmol/L (ref 3.5–5.1)
Sodium: 143 mmol/L (ref 135–145)
Total Bilirubin: 1.9 mg/dL — ABNORMAL HIGH (ref 0.3–1.2)
Total Protein: 8.9 g/dL — ABNORMAL HIGH (ref 6.5–8.1)

## 2020-10-07 LAB — CBC WITH DIFFERENTIAL/PLATELET
Abs Immature Granulocytes: 0.06 10*3/uL (ref 0.00–0.07)
Basophils Absolute: 0.1 10*3/uL (ref 0.0–0.1)
Basophils Relative: 1 %
Eosinophils Absolute: 0.9 10*3/uL — ABNORMAL HIGH (ref 0.0–0.5)
Eosinophils Relative: 6 %
HCT: 26 % — ABNORMAL LOW (ref 39.0–52.0)
Hemoglobin: 9.4 g/dL — ABNORMAL LOW (ref 13.0–17.0)
Immature Granulocytes: 0 %
Lymphocytes Relative: 25 %
Lymphs Abs: 3.6 10*3/uL (ref 0.7–4.0)
MCH: 33.3 pg (ref 26.0–34.0)
MCHC: 36.2 g/dL — ABNORMAL HIGH (ref 30.0–36.0)
MCV: 92.2 fL (ref 80.0–100.0)
Monocytes Absolute: 1.2 10*3/uL — ABNORMAL HIGH (ref 0.1–1.0)
Monocytes Relative: 8 %
Neutro Abs: 8.7 10*3/uL — ABNORMAL HIGH (ref 1.7–7.7)
Neutrophils Relative %: 60 %
Platelets: 331 10*3/uL (ref 150–400)
RBC: 2.82 MIL/uL — ABNORMAL LOW (ref 4.22–5.81)
RDW: 14.1 % (ref 11.5–15.5)
WBC: 14.5 10*3/uL — ABNORMAL HIGH (ref 4.0–10.5)
nRBC: 0.2 % (ref 0.0–0.2)

## 2020-10-07 LAB — RETICULOCYTES
Immature Retic Fract: 35.7 % — ABNORMAL HIGH (ref 2.3–15.9)
RBC.: 2.72 MIL/uL — ABNORMAL LOW (ref 4.22–5.81)
Retic Count, Absolute: 94.7 10*3/uL (ref 19.0–186.0)
Retic Ct Pct: 3.5 % — ABNORMAL HIGH (ref 0.4–3.1)

## 2020-10-07 LAB — RESP PANEL BY RT-PCR (FLU A&B, COVID) ARPGX2
Influenza A by PCR: NEGATIVE
Influenza B by PCR: NEGATIVE
SARS Coronavirus 2 by RT PCR: NEGATIVE

## 2020-10-07 MED ORDER — HYDROMORPHONE HCL 2 MG/ML IJ SOLN
2.0000 mg | INTRAMUSCULAR | Status: AC
Start: 2020-10-07 — End: 2020-10-07
  Administered 2020-10-07: 2 mg via INTRAVENOUS
  Filled 2020-10-07: qty 1

## 2020-10-07 MED ORDER — MORPHINE SULFATE 30 MG PO TABS
30.0000 mg | ORAL_TABLET | Freq: Every day | ORAL | Status: DC | PRN
Start: 2020-10-07 — End: 2020-10-09
  Administered 2020-10-08: 30 mg via ORAL
  Filled 2020-10-07: qty 1

## 2020-10-07 MED ORDER — KETOROLAC TROMETHAMINE 30 MG/ML IJ SOLN
15.0000 mg | Freq: Once | INTRAMUSCULAR | Status: AC
  Administered 2020-10-07: 15 mg via INTRAVENOUS
  Filled 2020-10-07: qty 1

## 2020-10-07 MED ORDER — NALOXONE HCL 0.4 MG/ML IJ SOLN
0.4000 mg | INTRAMUSCULAR | Status: DC | PRN
Start: 2020-10-07 — End: 2020-10-09

## 2020-10-07 MED ORDER — POLYETHYLENE GLYCOL 3350 17 G PO PACK: 17.0000 g | PACK | Freq: Every day | ORAL | Status: DC | PRN

## 2020-10-07 MED ORDER — SENNOSIDES-DOCUSATE SODIUM 8.6-50 MG PO TABS
1.0000 | ORAL_TABLET | Freq: Two times a day (BID) | ORAL | Status: DC
  Administered 2020-10-07 – 2020-10-09 (×4): 1 via ORAL
  Filled 2020-10-07 (×4): qty 1

## 2020-10-07 MED ORDER — SODIUM CHLORIDE 0.45 % IV SOLN: INTRAVENOUS | Status: DC

## 2020-10-07 MED ORDER — HYDROMORPHONE HCL 1 MG/ML IJ SOLN
1.0000 mg | INTRAMUSCULAR | Status: DC | PRN
  Administered 2020-10-07 – 2020-10-08 (×5): 1 mg via INTRAVENOUS
  Filled 2020-10-07 (×5): qty 1

## 2020-10-07 MED ORDER — ONDANSETRON HCL 4 MG/2ML IJ SOLN
4.0000 mg | INTRAMUSCULAR | Status: DC | PRN
  Administered 2020-10-07: 4 mg via INTRAVENOUS
  Filled 2020-10-07: qty 2

## 2020-10-07 MED ORDER — NICOTINE 21 MG/24HR TD PT24
21.0000 mg | MEDICATED_PATCH | Freq: Every day | TRANSDERMAL | Status: DC
  Filled 2020-10-07 (×3): qty 1

## 2020-10-07 MED ORDER — DIPHENHYDRAMINE HCL 50 MG/ML IJ SOLN
25.0000 mg | Freq: Once | INTRAMUSCULAR | Status: AC
  Administered 2020-10-07: 25 mg via INTRAVENOUS
  Filled 2020-10-07: qty 1

## 2020-10-07 MED ORDER — ONDANSETRON HCL 4 MG/2ML IJ SOLN
4.0000 mg | Freq: Four times a day (QID) | INTRAMUSCULAR | Status: DC | PRN
  Administered 2020-10-08 (×2): 4 mg via INTRAVENOUS
  Filled 2020-10-07 (×2): qty 2

## 2020-10-07 MED ORDER — KETOROLAC TROMETHAMINE 15 MG/ML IJ SOLN
15.0000 mg | Freq: Four times a day (QID) | INTRAMUSCULAR | Status: DC
  Administered 2020-10-07 – 2020-10-09 (×8): 15 mg via INTRAVENOUS
  Filled 2020-10-07 (×8): qty 1

## 2020-10-07 MED ORDER — FOLIC ACID 1 MG PO TABS
1.0000 mg | ORAL_TABLET | Freq: Every day | ORAL | Status: DC
  Filled 2020-10-07: qty 1

## 2020-10-07 MED ORDER — HYDROXYUREA 500 MG PO CAPS
500.0000 mg | ORAL_CAPSULE | Freq: Two times a day (BID) | ORAL | Status: DC
  Administered 2020-10-07 – 2020-10-09 (×4): 500 mg via ORAL
  Filled 2020-10-07 (×5): qty 1

## 2020-10-07 MED ORDER — FOLIC ACID 1 MG PO TABS
1.0000 mg | ORAL_TABLET | Freq: Every day | ORAL | Status: DC
  Administered 2020-10-08 – 2020-10-09 (×2): 1 mg via ORAL
  Filled 2020-10-07 (×2): qty 1

## 2020-10-07 MED ORDER — SODIUM CHLORIDE 0.45 % IV SOLN: INTRAVENOUS | Status: AC

## 2020-10-07 MED ORDER — MORPHINE SULFATE ER 15 MG PO TBCR
45.0000 mg | EXTENDED_RELEASE_TABLET | Freq: Two times a day (BID) | ORAL | Status: DC
  Administered 2020-10-07: 45 mg via ORAL
  Filled 2020-10-07: qty 1

## 2020-10-07 MED ORDER — HYDROMORPHONE 1 MG/ML IV SOLN
INTRAVENOUS | Status: DC
  Administered 2020-10-08: 3 mg via INTRAVENOUS
  Administered 2020-10-08: 30 mg via INTRAVENOUS
  Administered 2020-10-08: 4 mg via INTRAVENOUS
  Administered 2020-10-08: 7 mg via INTRAVENOUS
  Administered 2020-10-09: 3 mg via INTRAVENOUS
  Administered 2020-10-09: 3.5 mg via INTRAVENOUS
  Filled 2020-10-07: qty 30

## 2020-10-07 MED ORDER — ENOXAPARIN SODIUM 40 MG/0.4ML IJ SOSY
40.0000 mg | PREFILLED_SYRINGE | INTRAMUSCULAR | Status: DC
  Administered 2020-10-07 – 2020-10-08 (×2): 40 mg via SUBCUTANEOUS
  Filled 2020-10-07 (×2): qty 0.4

## 2020-10-07 MED ORDER — DIPHENHYDRAMINE HCL 25 MG PO CAPS: 25.0000 mg | ORAL_CAPSULE | ORAL | Status: DC | PRN

## 2020-10-07 MED ORDER — TIZANIDINE HCL 4 MG PO TABS: 4.0000 mg | ORAL_TABLET | Freq: Every day | ORAL | Status: DC | PRN

## 2020-10-07 MED ORDER — SODIUM CHLORIDE 0.9% FLUSH: 9.0000 mL | INTRAVENOUS | Status: DC | PRN

## 2020-10-07 NOTE — ED Triage Notes (Signed)
Patient c/o sickle cell pain in his right arm and right shoulder x 2-3 days ago.

## 2020-10-07 NOTE — ED Provider Notes (Signed)
[facility] DEPT Provider Note   CSN: [id number] Arrival date & time: 10/07/20  1040     History Chief Complaint  Patient presents with   Sickle Cell Pain Crisis    [name] is a 45 y.o. male.  45 year old male with history of sickle cell disease presents with pain to his right arm and shoulder.  Denies any fever or chills.  No cough or congestion.  Uses OxyContin at home which she has without relief.  No vomiting or diarrhea.  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   Chronic pain syndrome 04/29/2018   Acute chest syndrome ([place])    Sickle cell anemia with crisis ([place]) 01/22/2018   Sickle cell crisis ([place]) 01/20/2017   Sickle cell anemia ([place]) 08/13/2015   Sickle cell disease, type [place] ([place]) 10/18/2012   Sickle cell pain crisis ([place]) 10/13/2012   History of tobacco abuse 10/13/2012   Leukocytosis 05/02/2012   Anemia of chronic disease 11/08/2011   Avascular necrosis of hip, left ([place]) 08/27/2011   Tobacco abuse 05/15/2011    Past Surgical History:  Procedure Laterality Date   BONE GRAFT HIP ILIAC CREST     JOINT REPLACEMENT  2006   right total hip arthroplasty   Orif right hip fracture  1995       Family History  Adopted: Yes    Social History   Tobacco Use   Smoking status: Some Days    Packs/day: 0.50    Pack years: 0.00    Types: Cigarettes    Last attempt to quit: 01/27/2012    Years since quitting: 8.7   Smokeless tobacco: Never  Vaping Use   Vaping Use: Never used  Substance  Use Topics   Alcohol use: No   Drug use: No    Home Medications Prior to Admission medications   Medication Sig Start Date End Date Taking? Authorizing Provider  diphenhydrAMINE (BENADRYL) 25 MG tablet Take 25 mg by mouth every 6 (six) hours as needed for itching.    [provider]  folic acid (FOLVITE) 1 MG tablet Take 1 mg by mouth daily.    [provider]  hydroxyurea (HYDREA) 500 MG capsule Take 500 mg by mouth 2 (two) times daily. May take with food to minimize GI side effects.    [provider]  ibuprofen (ADVIL,MOTRIN) 800 MG tablet Take 800 mg by mouth every 8 (eight) hours as needed for moderate pain.  09/07/16   [provider]  morphine (MSIR) 30 MG tablet Take 1 tablet (30 mg total) by mouth daily as needed for severe pain (pain). Resume after completing dilaudid. 02/17/16   Leana Gamer, MD  Morphine Sulfate ER 15 MG TBEA Take 45 mg by mouth 2 (two) times daily as needed (pain).    [provider]  MOVANTIK 25 MG TABS tablet Take 25 mg by mouth daily. 12/17/17   [provider]  NARCAN 4 MG/0.1ML LIQD  nasal spray kit Place 4 mg into the nose once as needed (overdose). 12/16/17   [provider]  promethazine (PHENERGAN) 25 MG tablet Take 25 mg by mouth every 6 (six) hours as needed for nausea.    [provider]  tiZANidine (ZANAFLEX) 4 MG tablet Take 4 mg by mouth daily as needed for muscle spasms. 09/05/20   [provider]  Vitamin D, Ergocalciferol, (DRISDOL) 1.25 MG (50000 UNIT) CAPS capsule Take 1 capsule by mouth once a week. Sundays 07/31/20   [provider]    Allergies    Patient has no known allergies.  Review of Systems   Review of Systems  All other systems reviewed and are negative.  Physical Exam Updated Vital Signs BP 135/86   Pulse 68   Temp 98.1 F (36.7 C) (Oral)   Resp 18   Ht 1.88 m (6' 2")   Wt 79.4 kg   SpO2 96%   BMI 22.47 kg/m   Physical  Exam Vitals and nursing note reviewed.  Constitutional:      General: He is not in acute distress.    Appearance: Normal appearance. He is well-developed. He is not toxic-appearing.  HENT:     Head: Normocephalic and atraumatic.  Eyes:     General: Lids are normal.     Conjunctiva/sclera: Conjunctivae normal.     Pupils: Pupils are equal, round, and reactive to light.  Neck:     Thyroid: No thyroid mass.     Trachea: No tracheal deviation.  Cardiovascular:     Rate and Rhythm: Normal rate and regular rhythm.     Heart sounds: Normal heart sounds. No murmur heard.   No gallop.  Pulmonary:     Effort: Pulmonary effort is normal. No respiratory distress.     Breath sounds: Normal breath sounds. No stridor. No decreased breath sounds, wheezing, rhonchi or rales.  Abdominal:     General: There is no distension.     Palpations: Abdomen is soft.     Tenderness: There is no abdominal tenderness. There is no rebound.  Musculoskeletal:        General: No tenderness. Normal range of motion.     Cervical back: Normal range of motion and neck supple.     Comments: Full range of motion at the right arm and shoulder.  No warmness to the joint.  Neurovascular intact at right hand  Skin:    General: Skin is warm and dry.     Findings: No abrasion or rash.  Neurological:     Mental Status: He is alert and oriented to person, place, and time. Mental status is at baseline.     GCS: GCS eye subscore is 4. GCS verbal subscore is 5. GCS motor subscore is 6.     Cranial Nerves: Cranial nerves are intact. No cranial nerve deficit.     Sensory: No sensory deficit.     Motor: Motor function is intact.  Psychiatric:        Attention and Perception: Attention normal.        Speech: Speech normal.        Behavior: Behavior normal.    ED Results / Procedures / Treatments   Labs (all labs ordered are listed, but only abnormal results are displayed) Labs Reviewed  COMPREHENSIVE METABOLIC PANEL  CBC  WITH DIFFERENTIAL/PLATELET  RETICULOCYTES    EKG None  Radiology No results found.  Procedures Procedures   Medications Ordered in ED Medications  0.45 %   sodium chloride infusion (has no administration in time range)  HYDROmorphone (DILAUDID) injection 2 mg (has no administration in time range)  HYDROmorphone (DILAUDID) injection 2 mg (has no administration in time range)  ondansetron (ZOFRAN) injection 4 mg (has no administration in time range)  diphenhydrAMINE (BENADRYL) injection 25 mg (has no administration in time range)    ED Course  I have reviewed the triage vital signs and the nursing notes.  Pertinent labs & imaging results that were available during my care of the patient were reviewed by me and considered in my medical decision making (see chart for details).    MDM Rules/Calculators/A&P                          Patient medicated with IV Dilaudid x3 without improvement.  Will admit to the sickle cell service Final Clinical Impression(s) / ED Diagnoses Final diagnoses:  None    Rx / DC Orders ED Discharge Orders     None        Allen, Anthony, MD 10/07/20 1437  

## 2020-10-07 NOTE — H&P (Signed)
H&P  Patient Demographics:  [name], is a 45 y.o. male  MRN: [id number]   DOB - 21-Feb-1976  Admit Date - 10/07/2020  Outpatient Primary MD for the patient is Nolene Ebbs, MD  Chief Complaint  Patient presents with   Sickle Cell Pain Crisis      HPI:   [name]  is a 45 y.o. male is a 45 year old male with a medical history significant for sickle cell disease, chronic pain syndrome, opiate dependence and tolerance, and tobacco dependence that presents to the ER with complaints of right upper extremity pain, especially around the shoulder.  Pain intensity has been elevated over the past several days and is consistent with his previous sickle cell pain crises.  Patient has been taking MS Contin and MS IR consistently without much improvement.  Patient denies any recent injury.  He endorses some swelling to right hand.  He denies headache, fever, chills, shortness of breath, or dizziness.  No urinary symptoms, nausea, vomiting, or diarrhea.  No sick contact, recent travel, or exposure to COVID-19.  ER course: Patient is afebrile and oxygen saturation is 98% on RA.  Blood pressure 123/78, pulse 61, and temperature 98.1 F.  Hemoglobin 9.4, WBCs 14.5, complete blood count otherwise unremarkable.  Complete metabolic panel shows a bilirubin of 1.9, otherwise within normal limits.  COVID-19 test negative.  Patient's pain persists despite IV Dilaudid, IV Toradol, and IV fluids.  Patient admitted to [place] and observation for further management of sickle cell pain crisis.     Review of systems:  In addition to the HPI above, patient reports No fever or chills No Headache, No changes with vision or hearing No problems swallowing food or liquids No chest pain, cough or shortness of breath No abdominal pain, No nausea or vomiting, Bowel movements are regular No blood in stool or urine No dysuria No new skin rashes or bruises No new joints pains-aches No new weakness, tingling, numbness  in any extremity No recent weight gain or loss No polyuria, polydypsia or polyphagia No significant Mental Stressors   With Past History of the following :   Past Medical History:  Diagnosis Date   Avascular necrosis of hip ([place])    bilateral   Avascular necrosis of hip, left ([place]) 08/27/2011   Blood transfusion    Infection of bone, shoulder region ([place])    left shoulder   Pneumonia    Sickle cell crisis ([place])       Past Surgical History:  Procedure Laterality Date   BONE GRAFT HIP ILIAC CREST     JOINT REPLACEMENT  2006   right total hip arthroplasty   Orif right hip fracture  1995     Social History:   Social History   Tobacco Use   Smoking status: Some Days    Packs/day: 0.50    Pack years: 0.00    Types: Cigarettes    Last attempt to quit: 01/27/2012    Years since quitting: 8.7   Smokeless tobacco: Never  Substance Use Topics   Alcohol use: No     Lives - At home   Family History :   Family History  Adopted: Yes     Home Medications:   Prior to Admission medications   Medication Sig Start Date End Date Taking? Authorizing Provider  diphenhydrAMINE (BENADRYL) 25 MG tablet Take 25 mg by mouth every 6 (six) hours as needed for itching.    [provider]  folic acid (FOLVITE) 1 MG  tablet Take 1 mg by mouth daily.    [provider]  hydroxyurea (HYDREA) 500 MG capsule Take 500 mg by mouth 2 (two) times daily. May take with food to minimize GI side effects.    [provider]  ibuprofen (ADVIL,MOTRIN) 800 MG tablet Take 800 mg by mouth every 8 (eight) hours as needed for moderate pain.  09/07/16   [provider]  morphine (MS CONTIN) 15 MG 12 hr tablet Take 45 mg by mouth 2 (two) times daily. 09/11/20   [provider]  morphine (MSIR) 30 MG tablet Take 1 tablet (30 mg total) by mouth daily as needed for severe pain (pain). Resume after completing dilaudid. 02/17/16   Leana Gamer, MD  Morphine Sulfate ER  15 MG TBEA Take 45 mg by mouth 2 (two) times daily as needed (pain).    [provider]  MOVANTIK 25 MG TABS tablet Take 25 mg by mouth daily. 12/17/17   [provider]  NARCAN 4 MG/0.1ML LIQD nasal spray kit Place 4 mg into the nose once as needed (overdose). 12/16/17   [provider]  promethazine (PHENERGAN) 25 MG tablet Take 25 mg by mouth every 6 (six) hours as needed for nausea.    [provider]  tiZANidine (ZANAFLEX) 4 MG tablet Take 4 mg by mouth daily as needed for muscle spasms. 09/05/20   [provider]  Vitamin D, Ergocalciferol, (DRISDOL) 1.25 MG (50000 UNIT) CAPS capsule Take 1 capsule by mouth once a week. Sundays 07/31/20   [provider]     Allergies:   No Known Allergies   Physical Exam:   Vitals:   Vitals:   10/07/20 1400 10/07/20 1430  BP: 114/75 112/80  Pulse: (!) 59 (!) 55  Resp: 16 19  Temp:    SpO2: 96% 96%    Physical Exam: Constitutional: Patient appears well-developed and well-nourished. Not in obvious distress. HENT: Normocephalic, atraumatic, External right and left ear normal. Oropharynx is clear and moist.  Eyes: Conjunctivae and EOM are normal. PERRLA, no scleral icterus. Neck: Normal ROM. Neck supple. No JVD. No tracheal deviation. No thyromegaly. CVS: RRR, S1/S2 +, no murmurs, no gallops, no carotid bruit.  Pulmonary: Effort and breath sounds normal, no stridor, rhonchi, wheezes, rales.  Abdominal: Soft. BS +, no distension, tenderness, rebound or guarding.  Musculoskeletal: Normal range of motion. No edema and no tenderness.  Lymphadenopathy: No lymphadenopathy noted, cervical, inguinal or axillary Neuro: Alert. Normal reflexes, muscle tone coordination. No cranial nerve deficit. Skin: Skin is warm and dry. No rash noted. Not diaphoretic. No erythema. No pallor. Psychiatric: Normal mood and affect. Behavior, judgment, thought content normal.   Data Review:   CBC Recent Labs  Lab  10/07/20 1130  WBC 14.5*  HGB 9.4*  HCT 26.0*  PLT 331  MCV 92.2  MCH 33.3  MCHC 36.2*  RDW 14.1  LYMPHSABS 3.6  MONOABS 1.2*  EOSABS 0.9*  BASOSABS 0.1   ------------------------------------------------------------------------------------------------------------------  Chemistries  Recent Labs  Lab 10/07/20 1130  NA 143  K 3.5  CL 108  CO2 27  GLUCOSE 96  BUN 13  CREATININE 0.92  CALCIUM 9.6  AST 19  ALT 10  ALKPHOS 85  BILITOT 1.9*   ------------------------------------------------------------------------------------------------------------------ estimated creatinine clearance is 113.9 mL/min (by C-G formula based on SCr of 0.92 mg/dL). ------------------------------------------------------------------------------------------------------------------ No results for input(s): TSH, T4TOTAL, T3FREE, THYROIDAB in the last 72 hours.  Invalid input(s): FREET3  Coagulation profile No results for input(s): INR,  PROTIME in the last 168 hours. ------------------------------------------------------------------------------------------------------------------- No results for input(s): DDIMER in the last 72 hours. -------------------------------------------------------------------------------------------------------------------  Cardiac Enzymes No results for input(s): CKMB, TROPONINI, MYOGLOBIN in the last 168 hours.  Invalid input(s): CK ------------------------------------------------------------------------------------------------------------------ No results found for: BNP  ---------------------------------------------------------------------------------------------------------------  Urinalysis    Component Value Date/Time   COLORURINE YELLOW 12/28/2018 1831   APPEARANCEUR CLEAR 12/28/2018 1831   LABSPEC >1.046 (H) 12/28/2018 1831   PHURINE 5.0 12/28/2018 1831   GLUCOSEU NEGATIVE 12/28/2018 1831   HGBUR NEGATIVE 12/28/2018 1831   BILIRUBINUR NEGATIVE  12/28/2018 1831   KETONESUR NEGATIVE 12/28/2018 1831   PROTEINUR NEGATIVE 12/28/2018 1831   UROBILINOGEN 0.2 10/25/2013 0020   NITRITE NEGATIVE 12/28/2018 1831   LEUKOCYTESUR NEGATIVE 12/28/2018 1831    ----------------------------------------------------------------------------------------------------------------   Imaging Results:    No results found.   Assessment & Plan:  Principal Problem:   Sickle cell pain crisis (HCC) Active Problems:   Anemia of chronic disease   Leukocytosis   History of tobacco abuse   Chronic pain syndrome   Sickle cell disease with pain crisis: Pain persists despite treatment in ER, patient admitted to [place] for further management of sickle cell pain crisis. Initiate IV Dilaudid PCA with settings of 0.5 mg, 10-minute lockout, and 3 mg/h Toradol 15 mg every 6 hours for total of 5 days Continue MS Contin 15 mg every 12 hours Monitor vital signs very closely, reevaluate pain scale regularly, and supplemental oxygen as needed Patient will be reevaluated for pain in the context of function and relationship to baseline as care progresses  Leukocytosis: WBCs 14.5.  No signs of active infection.  Patient is afebrile.  COVID-19 negative.  More likely secondary to vaso-occlusive pain crisis.  Continue to monitor closely without antibiotics.  Repeat CBC in AM.  Anemia of chronic disease: Hemoglobin is 9.4, consistent with patient's baseline.  There is no clinical indication for blood transfusion at this time.  Continue to follow closely. Continue hydroxyurea and folic acid  Tobacco dependence: Patient counseled extensively about the dangers of smoking especially in the setting of sickle cell disease  Chronic pain syndrome: Continue home medications   DVT Prophylaxis: Subcut Lovenox   AM Labs Ordered, also please review Full Orders  Family Communication: Admission, patient's condition and plan of care including tests being ordered have been  discussed with the patient who indicate understanding and agree with the plan and Code Status.  Code Status: Full Code  Consults called: None    Admission status: Inpatient    Time spent in minutes : 35 minutes  [place], MSN, FNP-C [facility] [address] [place], [place] [address] [phone]  10/07/2020 at 2:47 PM

## 2020-10-08 DIAGNOSIS — Z79899 Other long term (current) drug therapy: Secondary | ICD-10-CM | POA: Diagnosis not present

## 2020-10-08 DIAGNOSIS — D72829 Elevated white blood cell count, unspecified: Secondary | ICD-10-CM | POA: Diagnosis present

## 2020-10-08 DIAGNOSIS — F1120 Opioid dependence, uncomplicated: Secondary | ICD-10-CM | POA: Diagnosis present

## 2020-10-08 DIAGNOSIS — D5700 Hb-SS disease with crisis, unspecified: Secondary | ICD-10-CM | POA: Diagnosis present

## 2020-10-08 DIAGNOSIS — F17210 Nicotine dependence, cigarettes, uncomplicated: Secondary | ICD-10-CM | POA: Diagnosis present

## 2020-10-08 DIAGNOSIS — G894 Chronic pain syndrome: Secondary | ICD-10-CM | POA: Diagnosis present

## 2020-10-08 DIAGNOSIS — Z96641 Presence of right artificial hip joint: Secondary | ICD-10-CM | POA: Diagnosis present

## 2020-10-08 DIAGNOSIS — Z20822 Contact with and (suspected) exposure to covid-19: Secondary | ICD-10-CM | POA: Diagnosis present

## 2020-10-08 LAB — CBC
HCT: 21.9 % — ABNORMAL LOW (ref 39.0–52.0)
Hemoglobin: 8 g/dL — ABNORMAL LOW (ref 13.0–17.0)
MCH: 34.2 pg — ABNORMAL HIGH (ref 26.0–34.0)
MCHC: 36.5 g/dL — ABNORMAL HIGH (ref 30.0–36.0)
MCV: 93.6 fL (ref 80.0–100.0)
Platelets: 259 10*3/uL (ref 150–400)
RBC: 2.34 MIL/uL — ABNORMAL LOW (ref 4.22–5.81)
RDW: 14.4 % (ref 11.5–15.5)
WBC: 9.5 10*3/uL (ref 4.0–10.5)
nRBC: 0 % (ref 0.0–0.2)

## 2020-10-08 MED ORDER — PROCHLORPERAZINE EDISYLATE 10 MG/2ML IJ SOLN
10.0000 mg | Freq: Four times a day (QID) | INTRAMUSCULAR | Status: DC | PRN
  Administered 2020-10-08 – 2020-10-09 (×2): 10 mg via INTRAVENOUS
  Filled 2020-10-08 (×2): qty 2

## 2020-10-08 NOTE — Progress Notes (Cosign Needed)
Subjective:  [name] is a 45 year old male with a medical history significant for sickle cell disease, chronic pain syndrome, opiate dependence and tolerance, and tobacco dependence was admitted for sickle cell pain crisis. Patient states that pain has improved some overnight.  He rates his pain as 7/10 primarily to right upper extremity.  He denies any fever, chills, headache, shortness of breath, chest pain, urinary symptoms, nausea, vomiting, or diarrhea. Objective:  Vital signs in last 24 hours:  Vitals:   10/08/20 0858 10/08/20 1135 10/08/20 1555 10/08/20 1558  BP:    113/75  Pulse:    74  Resp: 12 12 12    Temp:    98.2 F (36.8 C)  TempSrc:    Oral  SpO2: 98% 97% 96% 90%  Weight:      Height:        Intake/Output from previous day:  No intake or output data in the 24 hours ending 10/08/20 1848  Physical Exam: General: Alert, awake, oriented x3, in no acute distress.  HEENT: [place]/AT PEERL, EOMI Neck: Trachea midline,  no masses, no thyromegal,y no JVD, no carotid bruit OROPHARYNX:  Moist, No exudate/ erythema/lesions.  Heart: Regular rate and rhythm, without murmurs, rubs, gallops, PMI non-displaced, no heaves or thrills on palpation.  Lungs: Clear to auscultation, no wheezing or rhonchi noted. No increased vocal fremitus resonant to percussion  Abdomen: Soft, nontender, nondistended, positive bowel sounds, no masses no hepatosplenomegaly noted..  Neuro: No focal neurological deficits noted cranial nerves II through XII grossly intact. DTRs 2+ bilaterally upper and lower extremities. Strength 5 out of 5 in bilateral upper and lower extremities. Musculoskeletal: No warm swelling or erythema around joints, no spinal tenderness noted. Psychiatric: Patient alert and oriented x3, good insight and cognition, good recent to remote recall. Lymph node survey: No cervical axillary or inguinal lymphadenopathy noted.  Lab Results:  Basic Metabolic Panel:    Component Value  Date/Time   NA 143 10/07/2020 1130   K 3.5 10/07/2020 1130   CL 108 10/07/2020 1130   CO2 27 10/07/2020 1130   BUN 13 10/07/2020 1130   CREATININE 0.92 10/07/2020 1130   GLUCOSE 96 10/07/2020 1130   CALCIUM 9.6 10/07/2020 1130   CBC:    Component Value Date/Time   WBC 9.5 10/08/2020 0415   HGB 8.0 (L) 10/08/2020 0415   HCT 21.9 (L) 10/08/2020 0415   PLT 259 10/08/2020 0415   MCV 93.6 10/08/2020 0415   NEUTROABS 8.7 (H) 10/07/2020 1130   LYMPHSABS 3.6 10/07/2020 1130   MONOABS 1.2 (H) 10/07/2020 1130   EOSABS 0.9 (H) 10/07/2020 1130   BASOSABS 0.1 10/07/2020 1130    Recent Results (from the past 240 hour(s))  Resp Panel by RT-PCR (Flu A&B, Covid) Nasopharyngeal Swab     Status: None   Collection Time: 10/07/20  2:37 PM   Specimen: Nasopharyngeal Swab; Nasopharyngeal(NP) swabs in vial transport medium  Result Value Ref Range Status   SARS Coronavirus 2 by RT PCR NEGATIVE NEGATIVE Final    Comment: (NOTE) SARS-CoV-2 target nucleic acids are NOT DETECTED.  The SARS-CoV-2 RNA is generally detectable in upper respiratory specimens during the acute phase of infection. The lowest concentration of SARS-CoV-2 viral copies this assay can detect is 138 copies/mL. A negative result does not preclude SARS-Cov-2 infection and should not be used as the sole basis for treatment or other patient management decisions. A negative result may occur with  improper specimen collection/handling, submission of specimen other than nasopharyngeal swab, presence of  viral mutation(s) within the areas targeted by this assay, and inadequate number of viral copies(<138 copies/mL). A negative result must be combined with clinical observations, patient history, and epidemiological information. The expected result is Negative.  Fact Sheet for Patients:  BloggerCourse.com  Fact Sheet for Healthcare Providers:  SeriousBroker.it  This test is no t yet  approved or cleared by the [place] FDA and  has been authorized for detection and/or diagnosis of SARS-CoV-2 by FDA under an Emergency Use Authorization (EUA). This EUA will remain  in effect (meaning this test can be used) for the duration of the COVID-19 declaration under Section 564(b)(1) of the Act, 21 U.S.C.section 360bbb-3(b)(1), unless the authorization is terminated  or revoked sooner.       Influenza A by PCR NEGATIVE NEGATIVE Final   Influenza B by PCR NEGATIVE NEGATIVE Final    Comment: (NOTE) The Xpert Xpress SARS-CoV-2/FLU/RSV plus assay is intended as an aid in the diagnosis of influenza from Nasopharyngeal swab specimens and should not be used as a sole basis for treatment. Nasal washings and aspirates are unacceptable for Xpert Xpress SARS-CoV-2/FLU/RSV testing.  Fact Sheet for Patients: BloggerCourse.com  Fact Sheet for Healthcare Providers: SeriousBroker.it  This test is not yet approved or cleared by the [place] FDA and has been authorized for detection and/or diagnosis of SARS-CoV-2 by FDA under an Emergency Use Authorization (EUA). This EUA will remain in effect (meaning this test can be used) for the duration of the COVID-19 declaration under Section 564(b)(1) of the Act, 21 U.S.C. section 360bbb-3(b)(1), unless the authorization is terminated or revoked.  Performed at [facility], [address], [place], [place] [address]     Studies/Results: No results found.  Medications: Scheduled Meds:  enoxaparin (LOVENOX) injection  40 mg Subcutaneous Q24H   folic acid  1 mg Oral Daily   HYDROmorphone   Intravenous Q4H   hydroxyurea  500 mg Oral BID   ketorolac  15 mg Intravenous Q6H   nicotine  21 mg Transdermal Daily   senna-docusate  1 tablet Oral BID   Continuous Infusions: PRN Meds:.diphenhydrAMINE, morphine, naloxone **AND** sodium chloride flush, ondansetron  (ZOFRAN) IV, polyethylene glycol, tiZANidine  Consultants: None  Procedures: None  Antibiotics: None  Assessment/Plan: Principal Problem:   Sickle cell pain crisis (HCC) Active Problems:   Tobacco abuse   Anemia of chronic disease   Leukocytosis   History of tobacco abuse   Chronic pain syndrome  Sickle cell disease with pain crisis: Continue IV Dilaudid PCA without any changes in settings Continue IV fluids to [facility] Toradol 15 mg IV every 6 hours for total of 5 days Monitor vital signs very closely, reevaluate pain scale regularly, and supplemental oxygen as needed.  Leukocytosis: Stable.  No signs of active infection.  Patient continues to be afebrile.  COVID-19 negative. Likely secondary to vaso-occlusive crisis.  Continue to monitor closely without antibiotics.  Repeat CBC in AM.  Anemia of chronic disease: Hemoglobin is consistent with patient's baseline.  There is no clinical indication for blood transfusion at this time.  Continue to follow closely.  Tobacco dependence: Patient counseled extensively about the dangers of smoking.  Nicotine patch offered.  Chronic pain syndrome, Continue home medications Code Status: Full Code Family Communication: N/A Disposition Plan: Not yet ready for discharge  Julianne Handler  If 7PM-7AM, please contact night-coverage.  10/08/2020, 6:48 PM  LOS: 0 days

## 2020-10-09 DIAGNOSIS — D5700 Hb-SS disease with crisis, unspecified: Secondary | ICD-10-CM | POA: Diagnosis not present

## 2020-10-09 LAB — CBC
HCT: 24 % — ABNORMAL LOW (ref 39.0–52.0)
Hemoglobin: 8.6 g/dL — ABNORMAL LOW (ref 13.0–17.0)
MCH: 33.7 pg (ref 26.0–34.0)
MCHC: 35.8 g/dL (ref 30.0–36.0)
MCV: 94.1 fL (ref 80.0–100.0)
Platelets: 259 10*3/uL (ref 150–400)
RBC: 2.55 MIL/uL — ABNORMAL LOW (ref 4.22–5.81)
RDW: 14.4 % (ref 11.5–15.5)
WBC: 10.4 10*3/uL (ref 4.0–10.5)
nRBC: 0.2 % (ref 0.0–0.2)

## 2020-10-09 NOTE — Discharge Summary (Addendum)
Physician Discharge Summary  [name] HYW:[id number] DOB: 04/08/1976 DOA: 10/07/2020  PCP: Nolene Ebbs, MD  Admit date: 10/07/2020  Discharge date: 10/09/2020  Discharge Diagnoses:  Principal Problem:   Sickle cell pain crisis ([place]) Active Problems:   Tobacco abuse   Anemia of chronic disease   Leukocytosis   History of tobacco abuse   Chronic pain syndrome   Discharge Condition: Stable  Disposition:   Follow-up Information     Nolene Ebbs, MD Follow up.   Specialty: Internal Medicine Contact information: [place] [address] [phone]                Pt is discharged home in good condition and is to follow up with Nolene Ebbs, MD this week to have labs evaluated. [name] is instructed to increase activity slowly and balance with rest for the next few days, and use prescribed medication to complete treatment of pain  Diet: Regular Wt Readings from Last 3 Encounters:  10/07/20 79.4 kg  09/09/20 80.5 kg  12/28/18 77.1 kg    History of present illness:  [name]  is a 45 y.o. male is a 45 year old male with a medical history significant for sickle cell disease, chronic pain syndrome, opiate dependence and tolerance, and tobacco dependence that presents to the ER with complaints of right upper extremity pain, especially around the shoulder.  Pain intensity has been elevated over the past several days and is consistent with his previous sickle cell pain crises.  Patient has been taking MS Contin and MS IR consistently without much improvement.  Patient denies any recent injury.  He endorses some swelling to right hand.  He denies headache, fever, chills, shortness of breath, or dizziness.  No urinary symptoms, nausea, vomiting, or diarrhea.  No sick contact, recent travel, or exposure to COVID-19.  ER course: Patient is afebrile and oxygen saturation is 98% on RA.  Blood pressure 123/78, pulse 61, and temperature 98.1 F.   Hemoglobin 9.4, WBCs 14.5, complete blood count otherwise unremarkable.  Complete metabolic panel shows a bilirubin of 1.9, otherwise within normal limits.  COVID-19 test negative.  Patient's pain persists despite IV Dilaudid, IV Toradol, and IV fluids.  Patient admitted to [place] and observation for further management of sickle cell pain crisis.  Hospital Course:  Sickle cell pain crisis Patient was admitted for sickle cell pain crisis and managed appropriately with IVF, IV Dilaudid via PCA and IV Toradol, as well as other adjunct therapies per sickle cell pain management protocols.  IV Dilaudid PCA weaned appropriately.  Patient transition to home medications.  He is followed by pain management and has an appointment scheduled for 10/10/2020. His pain intensity has decreased to 4/10 and he is requesting discharge home.  Patient advised to follow-up with PCP for labs within 2 weeks.  At that time, repeat CBC with differential and CMP. Tobacco dependence: Patient is an everyday tobacco user.  He typically smokes 5-6 cigarettes/day.  He is not ready to quit.  Patient counseled extensively about the dangers of smoking especially in the setting of sickle cell disease.  He expressed understanding.  Patient is alert, oriented, and ambulating without any assistance.  Patient was therefore discharged home today in a hemodynamically stable condition.   [place] will follow-up with PCP within 1 week of this discharge. [name] was counseled extensively about nonpharmacologic means of pain management, patient verbalized understanding and was appreciative of  the care received during this admission.   We discussed  the need for good hydration, monitoring of hydration status, avoidance of heat, cold, stress, and infection triggers. We discussed the need to be adherent with taking Hydrea and other home medications. Patient was reminded of the need to seek medical attention immediately if any symptom of bleeding,  anemia, or infection occurs.  Discharge Exam: Vitals:   10/09/20 0951 10/09/20 1224  BP: 122/82   Pulse: 71   Resp: 17 15  Temp: 98.9 F (37.2 C)   SpO2: 98% 95%   Vitals:   10/09/20 0410 10/09/20 0835 10/09/20 0951 10/09/20 1224  BP:   122/82   Pulse:   71   Resp: _0 Temp:   98.9 F (37.2 C)   TempSrc:   Oral   SpO2: 95% 98% 98% 95%  Weight:      Height:        General appearance : Awake, alert, not in any distress. Speech Clear. Not toxic looking HEENT: Atraumatic and Normocephalic, pupils equally reactive to light and accomodation Neck: Supple, no JVD. No cervical lymphadenopathy.  Chest: Good air entry bilaterally, no added sounds  CVS: S1 S2 regular, no murmurs.  Abdomen: Bowel sounds present, Non tender and not distended with no guarding, rigidity or rebound. Extremities: B/L Lower Ext shows no edema, both legs are warm to touch Neurology: Awake alert, and oriented X 3, CN II-XII intact, Non focal Skin: No Rash  Discharge Instructions   Allergies as of 10/09/2020   No Known Allergies      Medication List     TAKE these medications    diphenhydrAMINE 25 MG tablet Commonly known as: BENADRYL Take 25 mg by mouth every 6 (six) hours as needed for itching.   folic acid 1 MG tablet Commonly known as: FOLVITE Take 1 mg by mouth daily.   hydroxyurea 500 MG capsule Commonly known as: HYDREA Take 500 mg by mouth 2 (two) times daily. May take with food to minimize GI side effects.   ibuprofen 800 MG tablet Commonly known as: ADVIL Take 800 mg by mouth every 8 (eight) hours as needed for moderate pain.   morphine 30 MG tablet Commonly known as: MSIR Take 1 tablet (30 mg total) by mouth daily as needed for severe pain (pain). Resume after completing dilaudid.   morphine 15 MG 12 hr tablet Commonly known as: MS CONTIN Take 45 mg by mouth 2 (two) times daily.   Movantik 25 MG Tabs tablet Generic drug: naloxegol oxalate Take 25 mg by mouth  daily.   Narcan 4 MG/0.1ML Liqd nasal spray kit Generic drug: naloxone Place 4 mg into the nose once as needed (overdose).   promethazine 25 MG tablet Commonly known as: PHENERGAN Take 25 mg by mouth every 6 (six) hours as needed for nausea.   tiZANidine 4 MG tablet Commonly known as: ZANAFLEX Take 4 mg by mouth daily as needed for muscle spasms.   Vitamin D (Ergocalciferol) 1.25 MG (50000 UNIT) Caps capsule Commonly known as: DRISDOL Take 1 capsule by mouth once a week. Sundays        The results of significant diagnostics from this hospitalization (including imaging, microbiology, ancillary and laboratory) are listed below for reference.    Significant Diagnostic Studies: No results found.  Microbiology: Recent Results (from the past 240 hour(s))  Resp Panel by RT-PCR (Flu A&B, Covid) Nasopharyngeal Swab     Status: None   Collection Time: 10/07/20  2:37 PM   Specimen: Nasopharyngeal Swab; Nasopharyngeal(NP) swabs in  vial transport medium  Result Value Ref Range Status   SARS Coronavirus 2 by RT PCR NEGATIVE NEGATIVE Final    Comment: (NOTE) SARS-CoV-2 target nucleic acids are NOT DETECTED.  The SARS-CoV-2 RNA is generally detectable in upper respiratory specimens during the acute phase of infection. The lowest concentration of SARS-CoV-2 viral copies this assay can detect is 138 copies/mL. A negative result does not preclude SARS-Cov-2 infection and should not be used as the sole basis for treatment or other patient management decisions. A negative result may occur with  improper specimen collection/handling, submission of specimen other than nasopharyngeal swab, presence of viral mutation(s) within the areas targeted by this assay, and inadequate number of viral copies(<138 copies/mL). A negative result must be combined with clinical observations, patient history, and epidemiological information. The expected result is Negative.  Fact Sheet for Patients:   EntrepreneurPulse.com.au  Fact Sheet for Healthcare Providers:  IncredibleEmployment.be  This test is no t yet approved or cleared by the [place] FDA and  has been authorized for detection and/or diagnosis of SARS-CoV-2 by FDA under an Emergency Use Authorization (EUA). This EUA will remain  in effect (meaning this test can be used) for the duration of the COVID-19 declaration under Section 564(b)(1) of the Act, 21 U.S.C.section 360bbb-3(b)(1), unless the authorization is terminated  or revoked sooner.       Influenza A by PCR NEGATIVE NEGATIVE Final   Influenza B by PCR NEGATIVE NEGATIVE Final    Comment: (NOTE) The Xpert Xpress SARS-CoV-2/FLU/RSV plus assay is intended as an aid in the diagnosis of influenza from Nasopharyngeal swab specimens and should not be used as a sole basis for treatment. Nasal washings and aspirates are unacceptable for Xpert Xpress SARS-CoV-2/FLU/RSV testing.  Fact Sheet for Patients: EntrepreneurPulse.com.au  Fact Sheet for Healthcare Providers: IncredibleEmployment.be  This test is not yet approved or cleared by the [place] FDA and has been authorized for detection and/or diagnosis of SARS-CoV-2 by FDA under an Emergency Use Authorization (EUA). This EUA will remain in effect (meaning this test can be used) for the duration of the COVID-19 declaration under Section 564(b)(1) of the Act, 21 U.S.C. section 360bbb-3(b)(1), unless the authorization is terminated or revoked.  Performed at [facility], [place] [address], [place], [place] [address]      Labs: Basic Metabolic Panel: Recent Labs  Lab 10/07/20 1130  NA 143  K 3.5  CL 108  CO2 27  GLUCOSE 96  BUN 13  CREATININE 0.92  CALCIUM 9.6   Liver Function Tests: Recent Labs  Lab 10/07/20 1130  AST 19  ALT 10  ALKPHOS 85  BILITOT 1.9*  PROT 8.9*  ALBUMIN 4.8   No results for  input(s): LIPASE, AMYLASE in the last 168 hours. No results for input(s): AMMONIA in the last 168 hours. CBC: Recent Labs  Lab 10/07/20 1130 10/08/20 0415 10/09/20 0559  WBC 14.5* 9.5 10.4  NEUTROABS 8.7*  --   --   HGB 9.4* 8.0* 8.6*  HCT 26.0* 21.9* 24.0*  MCV 92.2 93.6 94.1  PLT 331 259 259   Cardiac Enzymes: No results for input(s): CKTOTAL, CKMB, CKMBINDEX, TROPONINI in the last 168 hours. BNP: Invalid input(s): POCBNP CBG: No results for input(s): GLUCAP in the last 168 hours.  Time coordinating discharge: 35 minutes  Signed:  Donia Pounds  APRN, MSN, FNP-C [facility] [address] [place], [place] [address] [phone]  [facility] 10/10/2020, 6:08  PM

## 2020-10-09 NOTE — Plan of Care (Signed)
Pt had just one episode of nausea last night; well controlled with Rx; no s/s of acute distress or pain reported or observed; [place] pain well controlled with PCA pump; call light within reach and bed at lowest position for safety.

## 2021-05-13 ENCOUNTER — Other Ambulatory Visit: Payer: Self-pay

## 2021-05-13 ENCOUNTER — Encounter (HOSPITAL_COMMUNITY): Payer: Self-pay

## 2021-05-13 ENCOUNTER — Emergency Department (HOSPITAL_COMMUNITY): Payer: Medicare Other

## 2021-05-13 ENCOUNTER — Inpatient Hospital Stay (HOSPITAL_COMMUNITY)
Admission: EM | Admit: 2021-05-13 | Discharge: 2021-05-18 | DRG: 812 | Disposition: A | Discharge status: Home / Self Care | Payer: Medicare Other | Attending: Internal Medicine | Admitting: Internal Medicine

## 2021-05-13 DIAGNOSIS — D72829 Elevated white blood cell count, unspecified: Secondary | ICD-10-CM | POA: Diagnosis present

## 2021-05-13 DIAGNOSIS — F17210 Nicotine dependence, cigarettes, uncomplicated: Secondary | ICD-10-CM | POA: Diagnosis present

## 2021-05-13 DIAGNOSIS — Z79899 Other long term (current) drug therapy: Secondary | ICD-10-CM

## 2021-05-13 DIAGNOSIS — Z20822 Contact with and (suspected) exposure to covid-19: Secondary | ICD-10-CM | POA: Diagnosis present

## 2021-05-13 DIAGNOSIS — Z96641 Presence of right artificial hip joint: Secondary | ICD-10-CM | POA: Diagnosis present

## 2021-05-13 DIAGNOSIS — G894 Chronic pain syndrome: Secondary | ICD-10-CM | POA: Diagnosis present

## 2021-05-13 DIAGNOSIS — D5700 Hb-SS disease with crisis, unspecified: Secondary | ICD-10-CM | POA: Diagnosis not present

## 2021-05-13 DIAGNOSIS — F1120 Opioid dependence, uncomplicated: Secondary | ICD-10-CM | POA: Diagnosis present

## 2021-05-13 DIAGNOSIS — D638 Anemia in other chronic diseases classified elsewhere: Secondary | ICD-10-CM | POA: Diagnosis present

## 2021-05-13 DIAGNOSIS — Z87891 Personal history of nicotine dependence: Secondary | ICD-10-CM | POA: Diagnosis not present

## 2021-05-13 LAB — RESP PANEL BY RT-PCR (FLU A&B, COVID) ARPGX2
Influenza A by PCR: NEGATIVE
Influenza B by PCR: NEGATIVE
SARS Coronavirus 2 by RT PCR: NEGATIVE

## 2021-05-13 LAB — COMPREHENSIVE METABOLIC PANEL
ALT: 26 U/L (ref 0–44)
AST: 29 U/L (ref 15–41)
Albumin: 4.7 g/dL (ref 3.5–5.0)
Alkaline Phosphatase: 96 U/L (ref 38–126)
Anion gap: 5 (ref 5–15)
BUN: 17 mg/dL (ref 6–20)
CO2: 27 mmol/L (ref 22–32)
Calcium: 9.2 mg/dL (ref 8.9–10.3)
Chloride: 104 mmol/L (ref 98–111)
Creatinine, Ser: 1.05 mg/dL (ref 0.61–1.24)
GFR, Estimated: >60 mL/min (ref >60)
Glucose, Bld: 100 mg/dL — ABNORMAL HIGH (ref 70–99)
Potassium: 4.2 mmol/L (ref 3.5–5.1)
Sodium: 136 mmol/L (ref 135–145)
Total Bilirubin: 1.8 mg/dL — ABNORMAL HIGH (ref 0.3–1.2)
Total Protein: 8.7 g/dL — ABNORMAL HIGH (ref 6.5–8.1)

## 2021-05-13 LAB — CBC WITH DIFFERENTIAL/PLATELET
Abs Immature Granulocytes: 0.1 10*3/uL — ABNORMAL HIGH (ref 0.00–0.07)
Basophils Absolute: 0.1 10*3/uL (ref 0.0–0.1)
Basophils Relative: 1 %
Eosinophils Absolute: 0.7 10*3/uL — ABNORMAL HIGH (ref 0.0–0.5)
Eosinophils Relative: 5 %
HCT: 26.7 % — ABNORMAL LOW (ref 39.0–52.0)
Hemoglobin: 9.9 g/dL — ABNORMAL LOW (ref 13.0–17.0)
Immature Granulocytes: 1 %
Lymphocytes Relative: 18 %
Lymphs Abs: 2.2 10*3/uL (ref 0.7–4.0)
MCH: 34.1 pg — ABNORMAL HIGH (ref 26.0–34.0)
MCHC: 37.1 g/dL — ABNORMAL HIGH (ref 30.0–36.0)
MCV: 92.1 fL (ref 80.0–100.0)
Monocytes Absolute: 0.9 10*3/uL (ref 0.1–1.0)
Monocytes Relative: 7 %
Neutro Abs: 8.7 10*3/uL — ABNORMAL HIGH (ref 1.7–7.7)
Neutrophils Relative %: 68 %
Platelets: 343 10*3/uL (ref 150–400)
RBC: 2.9 MIL/uL — ABNORMAL LOW (ref 4.22–5.81)
RDW: 13.5 % (ref 11.5–15.5)
WBC: 12.7 10*3/uL — ABNORMAL HIGH (ref 4.0–10.5)
nRBC: 0.2 % (ref 0.0–0.2)

## 2021-05-13 LAB — RETICULOCYTES
Immature Retic Fract: 37.8 % — ABNORMAL HIGH (ref 2.3–15.9)
RBC.: 2.89 MIL/uL — ABNORMAL LOW (ref 4.22–5.81)
Retic Count, Absolute: 85.5 10*3/uL (ref 19.0–186.0)
Retic Ct Pct: 3 % (ref 0.4–3.1)

## 2021-05-13 MED ORDER — TIZANIDINE HCL 4 MG PO TABS: 4.0000 mg | ORAL_TABLET | Freq: Every day | ORAL | Status: DC | PRN

## 2021-05-13 MED ORDER — HYDROMORPHONE HCL 2 MG/ML IJ SOLN
2.0000 mg | INTRAMUSCULAR | Status: AC
  Administered 2021-05-13: 2 mg via INTRAVENOUS
  Filled 2021-05-13: qty 1

## 2021-05-13 MED ORDER — SENNOSIDES-DOCUSATE SODIUM 8.6-50 MG PO TABS
1.0000 | ORAL_TABLET | Freq: Two times a day (BID) | ORAL | Status: DC
  Administered 2021-05-13 – 2021-05-18 (×10): 1 via ORAL
  Filled 2021-05-13 (×10): qty 1

## 2021-05-13 MED ORDER — NALOXONE HCL 0.4 MG/ML IJ SOLN: 0.4000 mg | INTRAMUSCULAR | Status: DC | PRN

## 2021-05-13 MED ORDER — FOLIC ACID 1 MG PO TABS
1.0000 mg | ORAL_TABLET | Freq: Every day | ORAL | Status: DC
  Administered 2021-05-13 – 2021-05-18 (×6): 1 mg via ORAL
  Filled 2021-05-13 (×6): qty 1

## 2021-05-13 MED ORDER — POLYETHYLENE GLYCOL 3350 17 G PO PACK
17.0000 g | PACK | Freq: Every day | ORAL | Status: DC | PRN
  Administered 2021-05-16: 17 g via ORAL
  Filled 2021-05-13: qty 1

## 2021-05-13 MED ORDER — MORPHINE SULFATE ER 15 MG PO TBCR
45.0000 mg | EXTENDED_RELEASE_TABLET | Freq: Two times a day (BID) | ORAL | Status: DC
  Administered 2021-05-13 – 2021-05-18 (×10): 45 mg via ORAL
  Filled 2021-05-13 (×2): qty 3
  Filled 2021-05-13: qty 1
  Filled 2021-05-13: qty 3
  Filled 2021-05-13: qty 1
  Filled 2021-05-13 (×5): qty 3
  Filled 2021-05-13: qty 1

## 2021-05-13 MED ORDER — DIPHENHYDRAMINE HCL 25 MG PO CAPS
25.0000 mg | ORAL_CAPSULE | ORAL | Status: DC | PRN
  Administered 2021-05-13: 25 mg via ORAL
  Filled 2021-05-13: qty 1

## 2021-05-13 MED ORDER — SODIUM CHLORIDE 0.45 % IV SOLN: INTRAVENOUS | Status: DC

## 2021-05-13 MED ORDER — HYDROMORPHONE 1 MG/ML IV SOLN
INTRAVENOUS | Status: DC
  Administered 2021-05-14: 5.5 mg via INTRAVENOUS
  Administered 2021-05-14: 30 mg via INTRAVENOUS
  Administered 2021-05-15 (×2): 7.5 mg via INTRAVENOUS
  Administered 2021-05-15: 30 mg via INTRAVENOUS
  Administered 2021-05-15: 4 mg via INTRAVENOUS
  Administered 2021-05-16: 6.5 mg via INTRAVENOUS
  Administered 2021-05-16 (×2): 2.5 mg via INTRAVENOUS
  Administered 2021-05-16: 6 mg via INTRAVENOUS
  Filled 2021-05-13 (×2): qty 30

## 2021-05-13 MED ORDER — DIPHENHYDRAMINE HCL 25 MG PO CAPS
25.0000 mg | ORAL_CAPSULE | ORAL | Status: DC | PRN
  Administered 2021-05-13 – 2021-05-14 (×3): 25 mg via ORAL
  Filled 2021-05-13 (×3): qty 1

## 2021-05-13 MED ORDER — NALOXEGOL OXALATE 25 MG PO TABS
25.0000 mg | ORAL_TABLET | Freq: Every day | ORAL | Status: DC
  Administered 2021-05-14 – 2021-05-18 (×6): 25 mg via ORAL
  Filled 2021-05-13 (×6): qty 1

## 2021-05-13 MED ORDER — KETOROLAC TROMETHAMINE 15 MG/ML IJ SOLN
15.0000 mg | Freq: Four times a day (QID) | INTRAMUSCULAR | Status: AC
  Administered 2021-05-13 – 2021-05-18 (×20): 15 mg via INTRAVENOUS
  Filled 2021-05-13 (×20): qty 1

## 2021-05-13 MED ORDER — ENOXAPARIN SODIUM 40 MG/0.4ML IJ SOSY
40.0000 mg | PREFILLED_SYRINGE | INTRAMUSCULAR | Status: DC
  Administered 2021-05-13 – 2021-05-16 (×4): 40 mg via SUBCUTANEOUS
  Filled 2021-05-13 (×4): qty 0.4

## 2021-05-13 MED ORDER — HYDROMORPHONE HCL 1 MG/ML IJ SOLN
1.0000 mg | INTRAMUSCULAR | Status: DC | PRN
  Administered 2021-05-13 – 2021-05-15 (×12): 1 mg via INTRAVENOUS
  Filled 2021-05-13 (×13): qty 1

## 2021-05-13 MED ORDER — ONDANSETRON HCL 4 MG/2ML IJ SOLN
4.0000 mg | INTRAMUSCULAR | Status: DC | PRN
  Administered 2021-05-13 – 2021-05-17 (×12): 4 mg via INTRAVENOUS
  Filled 2021-05-13 (×12): qty 2

## 2021-05-13 MED ORDER — BUPROPION HCL ER (SR) 150 MG PO TB12
150.0000 mg | ORAL_TABLET | Freq: Two times a day (BID) | ORAL | Status: DC
  Administered 2021-05-13 – 2021-05-18 (×10): 150 mg via ORAL
  Filled 2021-05-13 (×10): qty 1

## 2021-05-13 MED ORDER — SODIUM CHLORIDE 0.9% FLUSH: 9.0000 mL | INTRAVENOUS | Status: DC | PRN

## 2021-05-13 MED ORDER — HYDROXYUREA 500 MG PO CAPS
500.0000 mg | ORAL_CAPSULE | Freq: Two times a day (BID) | ORAL | Status: DC
  Administered 2021-05-13 – 2021-05-18 (×10): 500 mg via ORAL
  Filled 2021-05-13 (×10): qty 1

## 2021-05-13 NOTE — ED Triage Notes (Signed)
Patient c/o sickle cell pain of the right mid back and right rib cage area x 3-4 days ago. Patient also c/o "A little bit of chest pain and SOB."

## 2021-05-13 NOTE — ED Provider Notes (Signed)
°[name]-EMERGENCY DEPT °Provider Note ° ° °CSN: [id number] °Arrival date & time: 05/13/21  1110 ° °  ° °History ° °Chief Complaint  °Patient presents with  ° Sickle Cell Pain Crisis  ° ° °[name] is a 46 y.o. male. ° °46-year-old male with history of sickle disease presents with several days of back pain consistent with his usual pain crisis.  Denies any urinary symptoms.  Mild cough but has not been short of breath.  No fever or chills.  No vomiting noted.  Denies any urinary symptoms.  Pain has been there unresponsive to his normal medications.  Is unsure of the trigger. ° ° °  ° °Home Medications °Prior to Admission medications   °Medication Sig Start Date End Date Taking? Authorizing Provider  °diphenhydrAMINE (BENADRYL) 25 MG tablet Take 25 mg by mouth every 6 (six) hours as needed for itching.    [provider]  °folic acid (FOLVITE) 1 MG tablet Take 1 mg by mouth daily.    [provider]  °hydroxyurea (HYDREA) 500 MG capsule Take 500 mg by mouth 2 (two) times daily. May take with food to minimize GI side effects.    [provider]  °ibuprofen (ADVIL,MOTRIN) 800 MG tablet Take 800 mg by mouth every 8 (eight) hours as needed for moderate pain.  09/07/16   [provider]  °morphine (MS CONTIN) 15 MG 12 hr tablet Take 45 mg by mouth 2 (two) times daily. 09/11/20   [provider]  °morphine (MSIR) 30 MG tablet Take 1 tablet (30 mg total) by mouth daily as needed for severe pain (pain). Resume after completing dilaudid. 02/17/16   [name], Michelle A, MD  °MOVANTIK 25 MG TABS tablet Take 25 mg by mouth daily. 12/17/17   [provider]  °NARCAN 4 MG/0.1ML LIQD nasal spray kit Place 4 mg into the nose once as needed (overdose). 12/16/17   [provider]  °promethazine (PHENERGAN) 25 MG tablet Take 25 mg by mouth every 6 (six) hours as needed for nausea.    [provider]  °tiZANidine (ZANAFLEX) 4 MG tablet Take  4 mg by mouth daily as needed for muscle spasms. 09/05/20   [provider]  °Vitamin D, Ergocalciferol, (DRISDOL) 1.25 MG (50000 UNIT) CAPS capsule Take 1 capsule by mouth once a week. Sundays 07/31/20   [provider]  °   ° °Allergies    °Patient has no known allergies.   ° °Review of Systems   °Review of Systems  °All other systems reviewed and are negative. ° °Physical Exam °Updated Vital Signs °BP 125/81 (BP Location: Right Arm)    Pulse 84    Temp 98.8 °F (37.1 °C) (Oral)    Resp 16    Ht 1.88 m (6' 2")    Wt 74.8 kg    SpO2 93%    BMI 21.18 kg/m²  °Physical Exam °Vitals and nursing note reviewed.  °Constitutional:   °   General: He is not in acute distress. °   Appearance: Normal appearance. He is well-developed. He is not toxic-appearing.  °HENT:  °   Head: Normocephalic and atraumatic.  °Eyes:  °   General: Lids are normal.  °   Conjunctiva/sclera: Conjunctivae normal.  °   Pupils: Pupils are equal, round, and reactive to light.  °Neck:  °   Thyroid: No thyroid mass.  °   Trachea: No tracheal deviation.  °Cardiovascular:  °   Rate and Rhythm:   Rhythm: Normal rate and regular rhythm.     Heart sounds: Normal heart sounds. No murmur heard.   No gallop.  Pulmonary:     Effort: Pulmonary effort is normal. No respiratory distress.     Breath sounds: Normal breath sounds. No stridor. No decreased breath sounds, wheezing, rhonchi or rales.  Abdominal:     General: There is no distension.     Palpations: Abdomen is soft.     Tenderness: There is no abdominal tenderness. There is no rebound.  Musculoskeletal:        General: No tenderness. Normal range of motion.     Cervical back: Normal range of motion and neck supple.  Skin:    General: Skin is warm and dry.     Findings: No abrasion or rash.  Neurological:     Mental Status: He is alert and oriented to person, place, and time. Mental status is at baseline.     GCS: GCS eye subscore is 4. GCS verbal subscore is 5. GCS motor subscore is  6.     Cranial Nerves: No cranial nerve deficit.     Sensory: No sensory deficit.     Motor: Motor function is intact.  Psychiatric:        Attention and Perception: Attention normal.        Speech: Speech normal.        Behavior: Behavior normal.    ED Results / Procedures / Treatments   Labs (all labs ordered are listed, but only abnormal results are displayed) Labs Reviewed  COMPREHENSIVE METABOLIC PANEL  CBC WITH DIFFERENTIAL/PLATELET  RETICULOCYTES    EKG None  Radiology No results found.  Procedures Procedures    Medications Ordered in ED Medications  0.45 % sodium chloride infusion (has no administration in time range)  HYDROmorphone (DILAUDID) injection 2 mg (has no administration in time range)  HYDROmorphone (DILAUDID) injection 2 mg (has no administration in time range)  diphenhydrAMINE (BENADRYL) capsule 25-50 mg (has no administration in time range)  ondansetron (ZOFRAN) injection 4 mg (has no administration in time range)    ED Course/ Medical Decision Making/ A&P                           Medical Decision Making Amount and/or Complexity of Data Reviewed Labs: ordered. Radiology: ordered.  Risk Prescription drug management.   Patient medicated with 2 rounds of IV hydromorphone here.  His pain is unchanged.  Patient has had a cough and concern for possible acute chest syndrome.  Chest x-ray was negative acute infiltrate.  He is afebrile.  Patient is not dyspneic.  Hemoglobin noted to be stable.  All records reviewed extensively.  Due to his ongoing pain, discussed with the sickle cell team and they will admit the patient        Final Clinical Impression(s) / ED Diagnoses Final diagnoses:  None    Rx / DC Orders ED Discharge Orders     None         Lacretia Leigh, MD 05/13/21 1416

## 2021-05-13 NOTE — H&P (Addendum)
H&P  Patient Demographics:  [name], is a 46 y.o. male  MRN: [id number]   DOB - [birth year]  Admit Date - 05/13/2021  Outpatient Primary MD for the patient is Nolene Ebbs, MD  Chief Complaint  Patient presents with   Sickle Cell Pain Crisis      HPI:   [name]  is a 46 y.o. male with a medical history significant for sickle cell disease, chronic pain syndrome, opiate dependence and tolerance, history of tobacco use, and history of anemia of chronic disease presents with complaints of generalized pain that is consistent with her typical pain crisis.  Patient states that pain intensity began escalating 4 days prior and has been mostly unrelieved by his home medications.  Patient endorses some chest discomfort that is consistent with previous pain crises.  Home medications consist of MS Contin and MSIR.  Patient has been taking these medications without sustained relief.  He attributes current pain crisis to changes in weather.  He denies any headache, dizziness, or shortness of breath.  No urinary symptoms, nausea, vomiting, or diarrhea.  No sick contacts, recent travel, or known exposure to COVID-19.   ER Course:  Vital signs show: BP 110/76    Pulse 72    Temp 98.8 F (37.1 C) (Oral)    Resp 18    Ht _0  (1.88 m)    Wt 74.8 kg    SpO2 96%    BMI 21.18 kg/m  Complete blood count shows WBCs 12.7, hemoglobin 9.9 g/dL, and platelets 343,000.  Complete metabolic panel unremarkable.  Chest x-ray shows no acute cardiopulmonary process.  Patient negative for COVID-19 and influenza.  His pain persists despite IV Dilaudid, IV Toradol, and IV fluids.  Patient will thereby be admitted to [place] for further management of sickle cell pain crisis.   Review of systems:  In addition to the HPI above, patient reports No fever or chills No Headache, No changes with vision or hearing No problems swallowing food or liquids No chest pain, cough or shortness of breath No abdominal pain, No nausea  or vomiting, Bowel movements are regular No blood in stool or urine No dysuria No new skin rashes or bruises No new joints pains-aches No new weakness, tingling, numbness in any extremity No recent weight gain or loss No polyuria, polydypsia or polyphagia No significant Mental Stressors  With Past History of the following :   Past Medical History:  Diagnosis Date   Avascular necrosis of hip ([place])    bilateral   Avascular necrosis of hip, left ([place]) 08/27/2011   Blood transfusion    Infection of bone, shoulder region [facility])    left shoulder   Pneumonia    Sickle cell crisis ([place])       Past Surgical History:  Procedure Laterality Date   BONE GRAFT HIP ILIAC CREST     JOINT REPLACEMENT  2006   right total hip arthroplasty   Orif right hip fracture  1995     Social History:   Social History   Tobacco Use   Smoking status: Some Days    Packs/day: 0.50    Types: Cigarettes    Last attempt to quit: 01/27/2012    Years since quitting: 9.2   Smokeless tobacco: Never  Substance Use Topics   Alcohol use: No     Lives - At home   Family History :   Family History  Adopted: Yes     Home Medications:   Prior to  Admission medications   Medication Sig Start Date End Date Taking? Authorizing Provider  buPROPion (ZYBAN) 150 MG 12 hr tablet Take 150 mg by mouth 2 (two) times daily. 05/04/21  Yes [provider]  diphenhydrAMINE (BENADRYL) 25 MG tablet Take 25 mg by mouth every 6 (six) hours as needed for itching.   Yes [provider]  folic acid (FOLVITE) 1 MG tablet Take 1 mg by mouth daily.   Yes [provider]  hydroxyurea (HYDREA) 500 MG capsule Take 500 mg by mouth 2 (two) times daily. May take with food to minimize GI side effects.   Yes [provider]  ibuprofen (ADVIL,MOTRIN) 800 MG tablet Take 800 mg by mouth every 8 (eight) hours as needed for moderate pain.  09/07/16  Yes [provider]  morphine (MS CONTIN) 15 MG 12  hr tablet Take 45 mg by mouth 2 (two) times daily. 09/11/20  Yes [provider]  morphine (MSIR) 30 MG tablet Take 1 tablet (30 mg total) by mouth daily as needed for severe pain (pain). Resume after completing dilaudid. 02/17/16  Yes Leana Gamer, MD  MOVANTIK 25 MG TABS tablet Take 25 mg by mouth daily. 12/17/17  Yes [provider]  NARCAN 4 MG/0.1ML LIQD nasal spray kit Place 4 mg into the nose once as needed (overdose). 12/16/17  Yes [provider]  promethazine (PHENERGAN) 25 MG tablet Take 25 mg by mouth every 6 (six) hours as needed for nausea.   Yes [provider]  tiZANidine (ZANAFLEX) 4 MG tablet Take 4 mg by mouth daily as needed for muscle spasms. 09/05/20  Yes [provider]  Vitamin D, Ergocalciferol, (DRISDOL) 1.25 MG (50000 UNIT) CAPS capsule Take 1 capsule by mouth once a week. Sundays 07/31/20  Yes [provider]     Allergies:   No Known Allergies   Physical Exam:   Vitals:   Vitals:   05/13/21 1406 05/13/21 1430  BP: 104/73 110/76  Pulse: 70 72  Resp: 18 18  Temp:    SpO2: 95% 96%    Physical Exam: Constitutional: Patient appears well-developed and well-nourished. Not in obvious distress. HENT: Normocephalic, atraumatic, External right and left ear normal. Oropharynx is clear and moist.  Eyes: Conjunctivae and EOM are normal. PERRLA, no scleral icterus. Neck: Normal ROM. Neck supple. No JVD. No tracheal deviation. No thyromegaly. CVS: RRR, S1/S2 +, no murmurs, no gallops, no carotid bruit.  Pulmonary: Effort and breath sounds normal, no stridor, rhonchi, wheezes, rales.  Abdominal: Soft. BS +, no distension, tenderness, rebound or guarding.  Musculoskeletal: Normal range of motion. No edema and no tenderness.  Lymphadenopathy: No lymphadenopathy noted, cervical, inguinal or axillary Neuro: Alert. Normal reflexes, muscle tone coordination. No cranial nerve deficit. Skin: Skin is warm and dry. No rash  noted. Not diaphoretic. No erythema. No pallor. Psychiatric: Normal mood and affect. Behavior, judgment, thought content normal.   Data Review:   CBC Recent Labs  Lab 05/13/21 1202  WBC 12.7*  HGB 9.9*  HCT 26.7*  PLT 343  MCV 92.1  MCH 34.1*  MCHC 37.1*  RDW 13.5  LYMPHSABS 2.2  MONOABS 0.9  EOSABS 0.7*  BASOSABS 0.1   ------------------------------------------------------------------------------------------------------------------  Chemistries  Recent Labs  Lab 05/13/21 1202  NA 136  K 4.2  CL 104  CO2 27  GLUCOSE 100*  BUN 17  CREATININE 1.05  CALCIUM 9.2  AST 29  ALT 26  ALKPHOS 96  BILITOT 1.8*   ------------------------------------------------------------------------------------------------------------------ estimated  creatinine clearance is 94 mL/min (by C-G formula based on SCr of 1.05 mg/dL). ------------------------------------------------------------------------------------------------------------------ No results for input(s): TSH, T4TOTAL, T3FREE, THYROIDAB in the last 72 hours.  Invalid input(s): FREET3  Coagulation profile No results for input(s): INR, PROTIME in the last 168 hours. ------------------------------------------------------------------------------------------------------------------- No results for input(s): DDIMER in the last 72 hours. -------------------------------------------------------------------------------------------------------------------  Cardiac Enzymes No results for input(s): CKMB, TROPONINI, MYOGLOBIN in the last 168 hours.  Invalid input(s): CK ------------------------------------------------------------------------------------------------------------------ No results found for: BNP  ---------------------------------------------------------------------------------------------------------------  Urinalysis    Component Value Date/Time   COLORURINE YELLOW 12/28/2018 1831   APPEARANCEUR CLEAR 12/28/2018 1831    LABSPEC >1.046 (H) 12/28/2018 1831   PHURINE 5.0 12/28/2018 1831   GLUCOSEU NEGATIVE 12/28/2018 1831   HGBUR NEGATIVE 12/28/2018 1831   BILIRUBINUR NEGATIVE 12/28/2018 1831   KETONESUR NEGATIVE 12/28/2018 1831   PROTEINUR NEGATIVE 12/28/2018 1831   UROBILINOGEN 0.2 10/25/2013 0020   NITRITE NEGATIVE 12/28/2018 1831   LEUKOCYTESUR NEGATIVE 12/28/2018 1831    ----------------------------------------------------------------------------------------------------------------   Imaging Results:    DG Chest Port 1 View  Result Date: 05/13/2021 CLINICAL DATA:  Cough EXAM: PORTABLE CHEST 1 VIEW COMPARISON:  09/06/2020 FINDINGS: Stable cardiomediastinal contours. Diffuse coarsened interstitial markings are identified bilaterally compatible with emphysema. Chronic bibasilar reticular interstitial markings, right greater than left. No superimposed airspace consolidation, atelectasis or pneumothorax. IMPRESSION: 1. Emphysema. 2. Chronic bibasilar reticular interstitial markings, right greater than left. Electronically Signed   By: Kerby Moors M.D.   On: 05/13/2021 13:13     Assessment & Plan:  Principal Problem:   Sickle cell pain crisis ([place]) Active Problems:   Anemia of chronic disease   History of tobacco abuse   Chronic pain syndrome  Sickle cell disease with pain crisis: Admit to MedSurg. Continue IV fluids, 0.45% saline at 50 mL/h Toradol 15 mg IV every 6 hours IV Dilaudid PCA per weight-based protocol MS Contin 15 mg every 12 hours Monitor vital signs very closely, reevaluate pain scale regularly, and supplemental oxygen as needed. Patient will be reevaluated for pain in the context of function and relationship to baseline as care progresses.  Leukocytosis: Mild leukocytosis.  More than likely reactive.  No apparent infection.  Continue to follow closely without antibiotics.  CBC in AM.  Chronic pain syndrome: Continue home medications  Anemia of chronic disease: Patient's  hemoglobin is 9.9 g/dL, which is consistent with patient's baseline.  There is no clinical indication for blood transfusion at this time.  Continue to monitor closely.  History of tobacco abuse: Patient will continue bupropion.  Counseled at length on the dangers of smoking especially in the settings of sickle cell disease.    DVT Prophylaxis: Subcut Lovenox and SCDs  AM Labs Ordered, also please review Full Orders  Family Communication: Admission, patient's condition and plan of care including tests being ordered have been discussed with the patient who indicate understanding and agree with the plan and Code Status.  Code Status: Full Code  Consults called: None    Admission status: Inpatient    Time spent in minutes : 30 minutes   Social Circle, MSN, FNP-C [facility] [address] [place], [place] [address] [phone]  05/13/2021 at 2:52 PM

## 2021-05-14 ENCOUNTER — Other Ambulatory Visit: Payer: Self-pay

## 2021-05-14 DIAGNOSIS — D5700 Hb-SS disease with crisis, unspecified: Secondary | ICD-10-CM | POA: Diagnosis not present

## 2021-05-14 LAB — BASIC METABOLIC PANEL
Anion gap: 7 (ref 5–15)
BUN: 25 mg/dL — ABNORMAL HIGH (ref 6–20)
CO2: 27 mmol/L (ref 22–32)
Calcium: 9.1 mg/dL (ref 8.9–10.3)
Chloride: 102 mmol/L (ref 98–111)
Creatinine, Ser: 1.19 mg/dL (ref 0.61–1.24)
GFR, Estimated: >60 mL/min (ref >60)
Glucose, Bld: 104 mg/dL — ABNORMAL HIGH (ref 70–99)
Potassium: 4.3 mmol/L (ref 3.5–5.1)
Sodium: 136 mmol/L (ref 135–145)

## 2021-05-14 LAB — CBC
HCT: 25.9 % — ABNORMAL LOW (ref 39.0–52.0)
Hemoglobin: 9.5 g/dL — ABNORMAL LOW (ref 13.0–17.0)
MCH: 34.3 pg — ABNORMAL HIGH (ref 26.0–34.0)
MCHC: 36.7 g/dL — ABNORMAL HIGH (ref 30.0–36.0)
MCV: 93.5 fL (ref 80.0–100.0)
Platelets: 300 10*3/uL (ref 150–400)
RBC: 2.77 MIL/uL — ABNORMAL LOW (ref 4.22–5.81)
RDW: 14 % (ref 11.5–15.5)
WBC: 17.4 10*3/uL — ABNORMAL HIGH (ref 4.0–10.5)
nRBC: 0.1 % (ref 0.0–0.2)

## 2021-05-14 MED ORDER — MORPHINE SULFATE 30 MG PO TABS: 30.0000 mg | ORAL_TABLET | ORAL | Status: DC | PRN

## 2021-05-14 NOTE — Plan of Care (Signed)

## 2021-05-14 NOTE — Progress Notes (Signed)
Subjective: [name] is a 46 year old male with a medical history significant for sickle cell disease, chronic pain syndrome, opiate dependence and tolerance, history of tobacco use, and history of anemia of chronic disease was admitted for sickle cell pain crisis.  Today, patient states that pain has improved some overnight.  Pain is primarily to central chest and low back.  He rates his pain as 8/10.  He denies any headache, dizziness, shortness of breath, urinary symptoms, nausea, vomiting, or diarrhea.  Objective:  Vital signs in last 24 hours:  Vitals:   05/14/21 0700 05/14/21 0800 05/14/21 1000 05/14/21 1100  BP: 105/61 (!) 102/55 (!) 94/57 (!) 96/54  Pulse: 67 63 61 61  Resp: 19 11 13 18   Temp:      TempSrc:      SpO2: 91% 92% 92% 95%  Weight:      Height:        Intake/Output from previous day:  No intake or output data in the 24 hours ending 05/14/21 1126  Physical Exam: General: Alert, awake, oriented x3, in no acute distress.  HEENT: [place]/AT PEERL, EOMI Neck: Trachea midline,  no masses, no thyromegal,y no JVD, no carotid bruit OROPHARYNX:  Moist, No exudate/ erythema/lesions.  Heart: Regular rate and rhythm, without murmurs, rubs, gallops, PMI non-displaced, no heaves or thrills on palpation.  Lungs: Clear to auscultation, no wheezing or rhonchi noted. No increased vocal fremitus resonant to percussion  Abdomen: Soft, nontender, nondistended, positive bowel sounds, no masses no hepatosplenomegaly noted..  Neuro: No focal neurological deficits noted cranial nerves II through XII grossly intact. DTRs 2+ bilaterally upper and lower extremities. Strength 5 out of 5 in bilateral upper and lower extremities. Musculoskeletal: No warm swelling or erythema around joints, no spinal tenderness noted. Psychiatric: Patient alert and oriented x3, good insight and cognition, good recent to remote recall. Lymph node survey: No cervical axillary or inguinal lymphadenopathy  noted.  Lab Results:  Basic Metabolic Panel:    Component Value Date/Time   NA 136 05/14/2021 0500   K 4.3 05/14/2021 0500   CL 102 05/14/2021 0500   CO2 27 05/14/2021 0500   BUN 25 (H) 05/14/2021 0500   CREATININE 1.19 05/14/2021 0500   GLUCOSE 104 (H) 05/14/2021 0500   CALCIUM 9.1 05/14/2021 0500   CBC:    Component Value Date/Time   WBC 17.4 (H) 05/14/2021 0500   HGB 9.5 (L) 05/14/2021 0500   HCT 25.9 (L) 05/14/2021 0500   PLT 300 05/14/2021 0500   MCV 93.5 05/14/2021 0500   NEUTROABS 8.7 (H) 05/13/2021 1202   LYMPHSABS 2.2 05/13/2021 1202   MONOABS 0.9 05/13/2021 1202   EOSABS 0.7 (H) 05/13/2021 1202   BASOSABS 0.1 05/13/2021 1202    Recent Results (from the past 240 hour(s))  Resp Panel by RT-PCR (Flu A&B, Covid) Nasopharyngeal Swab     Status: None   Collection Time: 05/13/21 12:39 PM   Specimen: Nasopharyngeal Swab; Nasopharyngeal(NP) swabs in vial transport medium  Result Value Ref Range Status   SARS Coronavirus 2 by RT PCR NEGATIVE NEGATIVE Final    Comment: (NOTE) SARS-CoV-2 target nucleic acids are NOT DETECTED.  The SARS-CoV-2 RNA is generally detectable in upper respiratory specimens during the acute phase of infection. The lowest concentration of SARS-CoV-2 viral copies this assay can detect is 138 copies/mL. A negative result does not preclude SARS-Cov-2 infection and should not be used as the sole basis for treatment or other patient management decisions. A negative result may occur with  improper specimen collection/handling, submission of specimen other than nasopharyngeal swab, presence of viral mutation(s) within the areas targeted by this assay, and inadequate number of viral copies(<138 copies/mL). A negative result must be combined with clinical observations, patient history, and epidemiological information. The expected result is Negative.  Fact Sheet for Patients:  BloggerCourse.com  Fact Sheet for Healthcare  Providers:  SeriousBroker.it  This test is no t yet approved or cleared by the [place] FDA and  has been authorized for detection and/or diagnosis of SARS-CoV-2 by FDA under an Emergency Use Authorization (EUA). This EUA will remain  in effect (meaning this test can be used) for the duration of the COVID-19 declaration under Section 564(b)(1) of the Act, 21 U.S.C.section 360bbb-3(b)(1), unless the authorization is terminated  or revoked sooner.       Influenza A by PCR NEGATIVE NEGATIVE Final   Influenza B by PCR NEGATIVE NEGATIVE Final    Comment: (NOTE) The Xpert Xpress SARS-CoV-2/FLU/RSV plus assay is intended as an aid in the diagnosis of influenza from Nasopharyngeal swab specimens and should not be used as a sole basis for treatment. Nasal washings and aspirates are unacceptable for Xpert Xpress SARS-CoV-2/FLU/RSV testing.  Fact Sheet for Patients: BloggerCourse.com  Fact Sheet for Healthcare Providers: SeriousBroker.it  This test is not yet approved or cleared by the [place] FDA and has been authorized for detection and/or diagnosis of SARS-CoV-2 by FDA under an Emergency Use Authorization (EUA). This EUA will remain in effect (meaning this test can be used) for the duration of the COVID-19 declaration under Section 564(b)(1) of the Act, 21 U.S.C. section 360bbb-3(b)(1), unless the authorization is terminated or revoked.  Performed at [facility], [address] [place], [place] [address]     Studies/Results: DG Chest Port 1 View  Result Date: 05/13/2021 CLINICAL DATA:  Cough EXAM: PORTABLE CHEST 1 VIEW COMPARISON:  09/06/2020 FINDINGS: Stable cardiomediastinal contours. Diffuse coarsened interstitial markings are identified bilaterally compatible with emphysema. Chronic bibasilar reticular interstitial markings, right greater than left. No superimposed  airspace consolidation, atelectasis or pneumothorax. IMPRESSION: 1. Emphysema. 2. Chronic bibasilar reticular interstitial markings, right greater than left. Electronically Signed   By: Signa Kell M.D.   On: 05/13/2021 13:13    Medications: Scheduled Meds:  buPROPion  150 mg Oral BID   enoxaparin (LOVENOX) injection  40 mg Subcutaneous Q24H   folic acid  1 mg Oral Daily   HYDROmorphone   Intravenous Q4H   hydroxyurea  500 mg Oral BID   ketorolac  15 mg Intravenous Q6H   morphine  45 mg Oral BID   naloxegol oxalate  25 mg Oral Daily   senna-docusate  1 tablet Oral BID   Continuous Infusions:  sodium chloride 50 mL/hr at 05/14/21 0145   PRN Meds:.diphenhydrAMINE, HYDROmorphone (DILAUDID) injection, morphine, naloxone **AND** sodium chloride flush, ondansetron, polyethylene glycol, tiZANidine  Consultants: none  Procedures: none  Antibiotics: none  Assessment/Plan: Principal Problem:   Sickle cell pain crisis ([facility]) Active Problems:   Anemia of chronic disease   History of tobacco abuse   Chronic pain syndrome  Sickle cell disease with pain crisis: Reduce IV fluids to KVO Continue IV Dilaudid PCA Toradol 15 mg IV every 6 hours MS Contin 15 mg every 12 hours MS IR 30 mg every 4 hours as needed for severe breakthrough pain Monitor vital signs very closely, reevaluate pain scale regularly, and supplemental oxygen as needed  Mild leukocytosis: Stable.  Patient afebrile.  No apparent infection.  Continue to follow closely.  Chronic pain syndrome: Continue home medications  Anemia of chronic disease: Hemoglobin is stable and consistent with patient's baseline.  There is no clinical indication for blood transfusion at this time.  Continue to monitor closely.  History of tobacco abuse: Continue bupropion.  Counseled at length on the dangers of smoking, especially in the setting of sickle cell disease.    Code Status: Full Code Family Communication: N/A Disposition  Plan: Not yet ready for discharge  [name] Rennis Petty  APRN, MSN, FNP-C [facility] [address] [place], [place] [address] [phone]  If 5PM-8AM, please contact night-coverage.  05/14/2021, 11:26 AM  LOS: 1 day

## 2021-05-14 NOTE — Progress Notes (Signed)
Received patient from ED via stretcher.  Patient is alert and oriented x 4.  Able to ambulate from stretcher to the bed independently.  Assisted in position of comfort in bed.  Oriented to room and unit routine.  Educated on plan of care.  Patient verbalized understanding.  Needs addressed, call bell within reach.

## 2021-05-15 DIAGNOSIS — D5700 Hb-SS disease with crisis, unspecified: Secondary | ICD-10-CM | POA: Diagnosis not present

## 2021-05-15 LAB — CBC
HCT: 22.6 % — ABNORMAL LOW (ref 39.0–52.0)
Hemoglobin: 8.2 g/dL — ABNORMAL LOW (ref 13.0–17.0)
MCH: 33.9 pg (ref 26.0–34.0)
MCHC: 36.3 g/dL — ABNORMAL HIGH (ref 30.0–36.0)
MCV: 93.4 fL (ref 80.0–100.0)
Platelets: 266 10*3/uL (ref 150–400)
RBC: 2.42 MIL/uL — ABNORMAL LOW (ref 4.22–5.81)
RDW: 14.2 % (ref 11.5–15.5)
WBC: 15 10*3/uL — ABNORMAL HIGH (ref 4.0–10.5)
nRBC: 0 % (ref 0.0–0.2)

## 2021-05-15 MED ORDER — ENSURE ENLIVE PO LIQD
237.0000 mL | Freq: Two times a day (BID) | ORAL | Status: DC
  Administered 2021-05-15 – 2021-05-18 (×4): 237 mL via ORAL

## 2021-05-15 NOTE — Plan of Care (Signed)

## 2021-05-15 NOTE — Progress Notes (Signed)
Subjective: [name] is a 46 year old male with a medical history significant for sickle cell disease, chronic pain syndrome, opiate dependence and tolerance, history of tobacco abuse and history of anemia of chronic disease that was admitted for sickle cell pain crisis.  Today, patient continues to complain of significant pain to chest, back, and lower extremities.  He rates his pain as 8/10.  Pain was essentially unchanged overnight.  He denies any headache, dizziness, shortness of breath, urinary symptoms, nausea, vomiting, or diarrhea.  Objective:  Vital signs in last 24 hours:  Vitals:   05/16/21 0852 05/16/21 0919 05/16/21 1203 05/16/21 1408  BP:  112/68    Pulse:  74    Resp: 18 17 18 14   Temp:  98.3 F (36.8 C)    TempSrc:  Oral    SpO2: 96% 98% 94% 96%  Weight:      Height:        Intake/Output from previous day:   Intake/Output Summary (Last 24 hours) at 05/16/2021 1532 Last data filed at 05/16/2021 1036 Gross per 24 hour  Intake 660.3 ml  Output 700 ml  Net -39.7 ml    Physical Exam: General: Alert, awake, oriented x3, in no acute distress.  HEENT: [place]/AT PEERL, EOMI Neck: Trachea midline,  no masses, no thyromegal,y no JVD, no carotid bruit OROPHARYNX:  Moist, No exudate/ erythema/lesions.  Heart: Regular rate and rhythm, without murmurs, rubs, gallops, PMI non-displaced, no heaves or thrills on palpation.  Lungs: Clear to auscultation, no wheezing or rhonchi noted. No increased vocal fremitus resonant to percussion  Abdomen: Soft, nontender, nondistended, positive bowel sounds, no masses no hepatosplenomegaly noted..  Neuro: No focal neurological deficits noted cranial nerves II through XII grossly intact. DTRs 2+ bilaterally upper and lower extremities. Strength 5 out of 5 in bilateral upper and lower extremities. Musculoskeletal: No warm swelling or erythema around joints, no spinal tenderness noted. Psychiatric: Patient alert and oriented x3, good insight  and cognition, good recent to remote recall. Lymph node survey: No cervical axillary or inguinal lymphadenopathy noted.  Lab Results:  Basic Metabolic Panel:    Component Value Date/Time   NA 136 05/14/2021 0500   K 4.3 05/14/2021 0500   CL 102 05/14/2021 0500   CO2 27 05/14/2021 0500   BUN 25 (H) 05/14/2021 0500   CREATININE 1.19 05/14/2021 0500   GLUCOSE 104 (H) 05/14/2021 0500   CALCIUM 9.1 05/14/2021 0500   CBC:    Component Value Date/Time   WBC 15.0 (H) 05/15/2021 0447   HGB 8.2 (L) 05/15/2021 0447   HCT 22.6 (L) 05/15/2021 0447   PLT 266 05/15/2021 0447   MCV 93.4 05/15/2021 0447   NEUTROABS 8.7 (H) 05/13/2021 1202   LYMPHSABS 2.2 05/13/2021 1202   MONOABS 0.9 05/13/2021 1202   EOSABS 0.7 (H) 05/13/2021 1202   BASOSABS 0.1 05/13/2021 1202    Recent Results (from the past 240 hour(s))  Resp Panel by RT-PCR (Flu A&B, Covid) Nasopharyngeal Swab     Status: None   Collection Time: 05/13/21 12:39 PM   Specimen: Nasopharyngeal Swab; Nasopharyngeal(NP) swabs in vial transport medium  Result Value Ref Range Status   SARS Coronavirus 2 by RT PCR NEGATIVE NEGATIVE Final    Comment: (NOTE) SARS-CoV-2 target nucleic acids are NOT DETECTED.  The SARS-CoV-2 RNA is generally detectable in upper respiratory specimens during the acute phase of infection. The lowest concentration of SARS-CoV-2 viral copies this assay can detect is 138 copies/mL. A negative result does not preclude SARS-Cov-2  infection and should not be used as the sole basis for treatment or other patient management decisions. A negative result may occur with  improper specimen collection/handling, submission of specimen other than nasopharyngeal swab, presence of viral mutation(s) within the areas targeted by this assay, and inadequate number of viral copies(<138 copies/mL). A negative result must be combined with clinical observations, patient history, and epidemiological information. The expected result is  Negative.  Fact Sheet for Patients:  BloggerCourse.comhttps://www.fda.gov/media/152166/download  Fact Sheet for Healthcare Providers:  SeriousBroker.ithttps://www.fda.gov/media/152162/download  This test is no t yet approved or cleared by the [place] FDA and  has been authorized for detection and/or diagnosis of SARS-CoV-2 by FDA under an Emergency Use Authorization (EUA). This EUA will remain  in effect (meaning this test can be used) for the duration of the COVID-19 declaration under Section 564(b)(1) of the Act, 21 U.S.C.section 360bbb-3(b)(1), unless the authorization is terminated  or revoked sooner.       Influenza A by PCR NEGATIVE NEGATIVE Final   Influenza B by PCR NEGATIVE NEGATIVE Final    Comment: (NOTE) The Xpert Xpress SARS-CoV-2/FLU/RSV plus assay is intended as an aid in the diagnosis of influenza from Nasopharyngeal swab specimens and should not be used as a sole basis for treatment. Nasal washings and aspirates are unacceptable for Xpert Xpress SARS-CoV-2/FLU/RSV testing.  Fact Sheet for Patients: BloggerCourse.comhttps://www.fda.gov/media/152166/download  Fact Sheet for Healthcare Providers: SeriousBroker.ithttps://www.fda.gov/media/152162/download  This test is not yet approved or cleared by the [place] FDA and has been authorized for detection and/or diagnosis of SARS-CoV-2 by FDA under an Emergency Use Authorization (EUA). This EUA will remain in effect (meaning this test can be used) for the duration of the COVID-19 declaration under Section 564(b)(1) of the Act, 21 U.S.C. section 360bbb-3(b)(1), unless the authorization is terminated or revoked.  Performed at [facility], [address] [place], [place] [address]     Studies/Results: No results found.  Medications: Scheduled Meds:  buPROPion  150 mg Oral BID   enoxaparin (LOVENOX) injection  40 mg Subcutaneous Q24H   feeding supplement  237 mL Oral BID BM   folic acid  1 mg Oral Daily   HYDROmorphone   Intravenous Q4H    hydroxyurea  500 mg Oral BID   ketorolac  15 mg Intravenous Q6H   morphine  45 mg Oral BID   naloxegol oxalate  25 mg Oral Daily   senna-docusate  1 tablet Oral BID   Continuous Infusions:  sodium chloride 10 mL/hr at 05/16/21 0349   PRN Meds:.diphenhydrAMINE, morphine, naloxone **AND** sodium chloride flush, ondansetron, polyethylene glycol, tiZANidine  Consultants: none  Procedures: none  Antibiotics: none  Assessment/Plan: Principal Problem:   Sickle cell pain crisis (HCC) Active Problems:   Anemia of chronic disease   History of tobacco abuse   Chronic pain syndrome  Sickle cell disease with pain crisis: Continue IV Dilaudid PCA without changes today Toradol 15 mg IV every 6 hours for total of 5 days Restart patient's home medications, MS IR 30 mg every 4 hours as needed for severe breakthrough pain Monitor vital signs very closely, reevaluate pain scale regularly, and supplemental oxygen as needed  Mild leukocytosis: Stable.  Patient afebrile.  No apparent infection.  Continue to follow closely.  Chronic pain syndrome: Continue home medications  Anemia of chronic disease: Patient's hemoglobin remained stable and consistent with his baseline.  There is no clinical indication for blood transfusion at this time.  Continue to follow closely.  Labs in AM.  History of tobacco abuse: Continue bupropion.  Patient counseled at length on admission.  Code Status: Full Code Family Communication: N/A Disposition Plan: Not yet ready for discharge  Erianna Jolly Rennis Petty  APRN, MSN, FNP-C [facility] [address] [place], [place] [address] [phone]  If 7PM-7AM, please contact night-coverage.  05/16/2021, 3:32 PM  LOS: 3 days

## 2021-05-15 NOTE — Progress Notes (Signed)
Initial Nutrition Assessment  INTERVENTION:   -Ensure Plus High Protein po BID, each supplement provides 350 kcal and 20 grams of protein.   NUTRITION DIAGNOSIS:   Increased nutrient needs related to chronic illness (sickle cell pain) as evidenced by estimated needs.  GOAL:   Patient will meet greater than or equal to 90% of their needs  MONITOR:   Labs, PO intake, Weight trends, Supplement acceptance, I & O's  REASON FOR ASSESSMENT:   Malnutrition Screening Tool    ASSESSMENT:   46 year old male with a medical history significant for sickle cell disease, chronic pain syndrome, opiate dependence and tolerance, history of tobacco use, and history of anemia of chronic disease was admitted for sickle cell pain crisis.  Patient in room. Pt reports he doesn't have a good appetite consistently. Pt does have Ensure supplements at home but doesn't drink them everyday. Pt had not eaten anything yet today. Actually started getting nauseous this morning and vomited. Suggested pt order some lunch and provided an Ensure for him to sip on.   Per weight records, pt has lost 12 lbs since 09/09/20 (6% wt loss x 8 months, insignificant for time frame).   Medications: folic acid, Senokot, Zofran  Labs reviewed.  NUTRITION - FOCUSED PHYSICAL EXAM:  Flowsheet Row Most Recent Value  Orbital Region No depletion  Upper Arm Region No depletion  Thoracic and Lumbar Region No depletion  Buccal Region No depletion  Temple Region No depletion  Clavicle Bone Region Mild depletion  Clavicle and Acromion Bone Region Mild depletion  Scapular Bone Region No depletion  Dorsal Hand No depletion  Patellar Region No depletion  Anterior Thigh Region No depletion  Posterior Calf Region Mild depletion  Edema (RD Assessment) None  Hair Reviewed  Eyes Reviewed  Mouth Reviewed  Skin Reviewed       Diet Order:   Diet Order             Diet regular Room service appropriate? Yes  Diet effective now                    EDUCATION NEEDS:   No education needs have been identified at this time  Skin:  Skin Assessment: Reviewed RN Assessment  Last BM:  1/17  Height:   Ht Readings from Last 1 Encounters:  05/13/21 6\' 2"  (1.88 m)    Weight:   Wt Readings from Last 1 Encounters:  05/13/21 74.8 kg    BMI:  Body mass index is 21.18 kg/m.  Estimated Nutritional Needs:   Kcal:  1850-2050  Protein:  90-105g  Fluid:  2L/day  05/15/21, MS, RD, LDN Inpatient Clinical Dietitian Contact information available via Amion

## 2021-05-16 DIAGNOSIS — D5700 Hb-SS disease with crisis, unspecified: Secondary | ICD-10-CM | POA: Diagnosis not present

## 2021-05-16 MED ORDER — HYDROMORPHONE 1 MG/ML IV SOLN
INTRAVENOUS | Status: DC
  Administered 2021-05-16: 2.5 mg via INTRAVENOUS
  Administered 2021-05-16: 5 mg via INTRAVENOUS
  Administered 2021-05-16: 30 mg via INTRAVENOUS
  Administered 2021-05-17: 4 mg via INTRAVENOUS
  Administered 2021-05-17: 2.5 mg via INTRAVENOUS
  Administered 2021-05-17: 2.78 mg via INTRAVENOUS
  Administered 2021-05-17: 4.5 mg via INTRAVENOUS
  Administered 2021-05-17: 6.5 mg via INTRAVENOUS
  Administered 2021-05-17: 5.5 mg via INTRAVENOUS
  Administered 2021-05-18: 2.5 mg via INTRAVENOUS
  Administered 2021-05-18: 0.5 mg via INTRAVENOUS
  Administered 2021-05-18: 4 mg via INTRAVENOUS
  Administered 2021-05-18: 2 mg via INTRAVENOUS
  Filled 2021-05-16 (×2): qty 30

## 2021-05-16 NOTE — Care Management Important Message (Signed)
Important Message  Patient Details IM Letter given to the Patient. Name: [name] MRN: [id number] Date of Birth: 1975/09/16   Medicare Important Message Given:  Yes     Caren Macadam 05/16/2021, 9:45 AM

## 2021-05-16 NOTE — Progress Notes (Signed)
°  Transition of Care (TOC) Screening Note   Patient Details  Name: [name] Date of Birth: 1975/06/28   Transition of Care [facility]) CM/SW Contact:    Kamali Sakata, Meriam Sprague, RN Phone Number: 05/16/2021, 2:47 PM    Transition of Care Department [facility]) has reviewed patient and no TOC needs have been identified at this time. We will continue to monitor patient advancement through interdisciplinary progression rounds. If new patient transition needs arise, please place a TOC consult.

## 2021-05-16 NOTE — Progress Notes (Signed)
Subjective: [name] is a 46 year old male with a medical history significant for sickle cell disease, chronic pain syndrome, opiate dependence and tolerance, history of tobacco abuse and history of anemia of chronic disease that was admitted for sickle cell pain crisis.  Today, patient continues to complain of significant pain to chest, back, and lower extremities.  He appears to be resting comfortably.  He rates his pain as 8/10. He denies any headache, dizziness, shortness of breath, urinary symptoms, nausea, vomiting, or diarrhea.  Objective:  Vital signs in last 24 hours:  Vitals:   05/16/21 0852 05/16/21 0919 05/16/21 1203 05/16/21 1408  BP:  112/68    Pulse:  74    Resp: 18 17 18 14   Temp:  98.3 F (36.8 C)    TempSrc:  Oral    SpO2: 96% 98% 94% 96%  Weight:      Height:        Intake/Output from previous day:   Intake/Output Summary (Last 24 hours) at 05/16/2021 1536 Last data filed at 05/16/2021 1036 Gross per 24 hour  Intake 660.3 ml  Output 700 ml  Net -39.7 ml    Physical Exam: General: Alert, awake, oriented x3, in no acute distress.  HEENT: [place]/AT PEERL, EOMI Neck: Trachea midline,  no masses, no thyromegal,y no JVD, no carotid bruit OROPHARYNX:  Moist, No exudate/ erythema/lesions.  Heart: Regular rate and rhythm, without murmurs, rubs, gallops, PMI non-displaced, no heaves or thrills on palpation.  Lungs: Clear to auscultation, no wheezing or rhonchi noted. No increased vocal fremitus resonant to percussion  Abdomen: Soft, nontender, nondistended, positive bowel sounds, no masses no hepatosplenomegaly noted..  Neuro: No focal neurological deficits noted cranial nerves II through XII grossly intact. DTRs 2+ bilaterally upper and lower extremities. Strength 5 out of 5 in bilateral upper and lower extremities. Musculoskeletal: No warm swelling or erythema around joints, no spinal tenderness noted. Psychiatric: Patient alert and oriented x3, good insight and  cognition, good recent to remote recall. Lymph node survey: No cervical axillary or inguinal lymphadenopathy noted.  Lab Results:  Basic Metabolic Panel:    Component Value Date/Time   NA 136 05/14/2021 0500   K 4.3 05/14/2021 0500   CL 102 05/14/2021 0500   CO2 27 05/14/2021 0500   BUN 25 (H) 05/14/2021 0500   CREATININE 1.19 05/14/2021 0500   GLUCOSE 104 (H) 05/14/2021 0500   CALCIUM 9.1 05/14/2021 0500   CBC:    Component Value Date/Time   WBC 15.0 (H) 05/15/2021 0447   HGB 8.2 (L) 05/15/2021 0447   HCT 22.6 (L) 05/15/2021 0447   PLT 266 05/15/2021 0447   MCV 93.4 05/15/2021 0447   NEUTROABS 8.7 (H) 05/13/2021 1202   LYMPHSABS 2.2 05/13/2021 1202   MONOABS 0.9 05/13/2021 1202   EOSABS 0.7 (H) 05/13/2021 1202   BASOSABS 0.1 05/13/2021 1202    Recent Results (from the past 240 hour(s))  Resp Panel by RT-PCR (Flu A&B, Covid) Nasopharyngeal Swab     Status: None   Collection Time: 05/13/21 12:39 PM   Specimen: Nasopharyngeal Swab; Nasopharyngeal(NP) swabs in vial transport medium  Result Value Ref Range Status   SARS Coronavirus 2 by RT PCR NEGATIVE NEGATIVE Final    Comment: (NOTE) SARS-CoV-2 target nucleic acids are NOT DETECTED.  The SARS-CoV-2 RNA is generally detectable in upper respiratory specimens during the acute phase of infection. The lowest concentration of SARS-CoV-2 viral copies this assay can detect is 138 copies/mL. A negative result does not preclude SARS-Cov-2  infection and should not be used as the sole basis for treatment or other patient management decisions. A negative result may occur with  improper specimen collection/handling, submission of specimen other than nasopharyngeal swab, presence of viral mutation(s) within the areas targeted by this assay, and inadequate number of viral copies(<138 copies/mL). A negative result must be combined with clinical observations, patient history, and epidemiological information. The expected result is  Negative.  Fact Sheet for Patients:  BloggerCourse.comhttps://www.fda.gov/media/152166/download  Fact Sheet for Healthcare Providers:  SeriousBroker.ithttps://www.fda.gov/media/152162/download  This test is no t yet approved or cleared by the [place] FDA and  has been authorized for detection and/or diagnosis of SARS-CoV-2 by FDA under an Emergency Use Authorization (EUA). This EUA will remain  in effect (meaning this test can be used) for the duration of the COVID-19 declaration under Section 564(b)(1) of the Act, 21 U.S.C.section 360bbb-3(b)(1), unless the authorization is terminated  or revoked sooner.       Influenza A by PCR NEGATIVE NEGATIVE Final   Influenza B by PCR NEGATIVE NEGATIVE Final    Comment: (NOTE) The Xpert Xpress SARS-CoV-2/FLU/RSV plus assay is intended as an aid in the diagnosis of influenza from Nasopharyngeal swab specimens and should not be used as a sole basis for treatment. Nasal washings and aspirates are unacceptable for Xpert Xpress SARS-CoV-2/FLU/RSV testing.  Fact Sheet for Patients: BloggerCourse.comhttps://www.fda.gov/media/152166/download  Fact Sheet for Healthcare Providers: SeriousBroker.ithttps://www.fda.gov/media/152162/download  This test is not yet approved or cleared by the [place] FDA and has been authorized for detection and/or diagnosis of SARS-CoV-2 by FDA under an Emergency Use Authorization (EUA). This EUA will remain in effect (meaning this test can be used) for the duration of the COVID-19 declaration under Section 564(b)(1) of the Act, 21 U.S.C. section 360bbb-3(b)(1), unless the authorization is terminated or revoked.  Performed at [facility], [address] [place], [place] [address]     Studies/Results: No results found.  Medications: Scheduled Meds:  buPROPion  150 mg Oral BID   enoxaparin (LOVENOX) injection  40 mg Subcutaneous Q24H   feeding supplement  237 mL Oral BID BM   folic acid  1 mg Oral Daily   HYDROmorphone   Intravenous Q4H    hydroxyurea  500 mg Oral BID   ketorolac  15 mg Intravenous Q6H   morphine  45 mg Oral BID   naloxegol oxalate  25 mg Oral Daily   senna-docusate  1 tablet Oral BID   Continuous Infusions:  sodium chloride 10 mL/hr at 05/16/21 0349   PRN Meds:.diphenhydrAMINE, morphine, naloxone **AND** sodium chloride flush, ondansetron, polyethylene glycol, tiZANidine  Consultants: none  Procedures: none  Antibiotics: none  Assessment/Plan: Principal Problem:   Sickle cell pain crisis (HCC) Active Problems:   Anemia of chronic disease   History of tobacco abuse   Chronic pain syndrome  Sickle cell disease with pain crisis: Continue IV Dilaudid PCA without changes today Toradol 15 mg IV every 6 hours for total of 5 days Continue MS IR 30 mg every 4 hours as needed for severe breakthrough pain Monitor vital signs very closely, reevaluate pain scale regularly, and supplemental oxygen as needed  Mild leukocytosis: Stable.  Patient afebrile.  No apparent infection.  Continue to follow closely.  Chronic pain syndrome: Continue home medications  Anemia of chronic disease: Patient's hemoglobin remained stable and consistent with his baseline.  There is no clinical indication for blood transfusion at this time.  Continue to follow closely.  Labs in AM.  History of  tobacco abuse: Continue bupropion.  Patient counseled at length on admission.  Code Status: Full Code Family Communication: N/A Disposition Plan: Not yet ready for discharge  Bruchy Mikel Rennis Petty  APRN, MSN, FNP-C [facility] [address] [place], [place] [address] [phone]  If 5PM-7AM, please contact night-coverage.  05/16/2021, 3:36 PM  LOS: 3 days

## 2021-05-17 DIAGNOSIS — G894 Chronic pain syndrome: Secondary | ICD-10-CM

## 2021-05-17 DIAGNOSIS — Z87891 Personal history of nicotine dependence: Secondary | ICD-10-CM | POA: Diagnosis not present

## 2021-05-17 DIAGNOSIS — D638 Anemia in other chronic diseases classified elsewhere: Secondary | ICD-10-CM | POA: Diagnosis not present

## 2021-05-17 DIAGNOSIS — D5700 Hb-SS disease with crisis, unspecified: Secondary | ICD-10-CM | POA: Diagnosis not present

## 2021-05-17 LAB — CBC
HCT: 21.6 % — ABNORMAL LOW (ref 39.0–52.0)
Hemoglobin: 8.1 g/dL — ABNORMAL LOW (ref 13.0–17.0)
MCH: 34.3 pg — ABNORMAL HIGH (ref 26.0–34.0)
MCHC: 37.5 g/dL — ABNORMAL HIGH (ref 30.0–36.0)
MCV: 91.5 fL (ref 80.0–100.0)
Platelets: 236 10*3/uL (ref 150–400)
RBC: 2.36 MIL/uL — ABNORMAL LOW (ref 4.22–5.81)
RDW: 14 % (ref 11.5–15.5)
WBC: 11.8 10*3/uL — ABNORMAL HIGH (ref 4.0–10.5)
nRBC: 0.2 % (ref 0.0–0.2)

## 2021-05-17 NOTE — Progress Notes (Signed)
Patient ID: [name], male   DOB: [birth year], 46 y.o.   MRN: [id number] Subjective: [name] is a 46 year old male with a medical history significant for sickle cell disease, chronic pain syndrome, opiate dependence and tolerance, history of tobacco abuse and history of anemia of chronic disease that was admitted for sickle cell pain crisis.  Patient is still in significant amount of pain today, he said his pain is between 6 and 7/10 as against his goal of below 4/10.  He is trying to ambulate but still has a little bit of pain in his lower back.  Chest pain has resolved.  He denies any fever, cough, chest pain, shortness of breath, nausea, vomiting or diarrhea.  Objective:  Vital signs in last 24 hours:  Vitals:   05/17/21 0435 05/17/21 0805 05/17/21 1041 05/17/21 1228  BP:   (!) 104/57   Pulse:   (!) 54   Resp: 18 16 14 16   Temp:   98 F (36.7 C)   TempSrc:   Oral   SpO2: 99% 99% 97% 98%  Weight:      Height:        Intake/Output from previous day:   Intake/Output Summary (Last 24 hours) at 05/17/2021 1340 Last data filed at 05/17/2021 4076 Gross per 24 hour  Intake 555.15 ml  Output 1900 ml  Net -1344.85 ml    Physical Exam: General: Alert, awake, oriented x3, in no acute distress.  HEENT: [place]/AT PEERL, EOMI Neck: Trachea midline,  no masses, no thyromegal,y no JVD, no carotid bruit OROPHARYNX:  Moist, No exudate/ erythema/lesions.  Heart: Regular rate and rhythm, without murmurs, rubs, gallops, PMI non-displaced, no heaves or thrills on palpation.  Lungs: Clear to auscultation, no wheezing or rhonchi noted. No increased vocal fremitus resonant to percussion  Abdomen: Soft, nontender, nondistended, positive bowel sounds, no masses no hepatosplenomegaly noted..  Neuro: No focal neurological deficits noted cranial nerves II through XII grossly intact. DTRs 2+ bilaterally upper and lower extremities. Strength 5 out of 5 in bilateral upper and lower  extremities. Musculoskeletal: No warm swelling or erythema around joints, no spinal tenderness noted. Psychiatric: Patient alert and oriented x3, good insight and cognition, good recent to remote recall. Lymph node survey: No cervical axillary or inguinal lymphadenopathy noted.  Lab Results:  Basic Metabolic Panel:    Component Value Date/Time   NA 136 05/14/2021 0500   K 4.3 05/14/2021 0500   CL 102 05/14/2021 0500   CO2 27 05/14/2021 0500   BUN 25 (H) 05/14/2021 0500   CREATININE 1.19 05/14/2021 0500   GLUCOSE 104 (H) 05/14/2021 0500   CALCIUM 9.1 05/14/2021 0500   CBC:    Component Value Date/Time   WBC 11.8 (H) 05/17/2021 0546   HGB 8.1 (L) 05/17/2021 0546   HCT 21.6 (L) 05/17/2021 0546   PLT 236 05/17/2021 0546   MCV 91.5 05/17/2021 0546   NEUTROABS 8.7 (H) 05/13/2021 1202   LYMPHSABS 2.2 05/13/2021 1202   MONOABS 0.9 05/13/2021 1202   EOSABS 0.7 (H) 05/13/2021 1202   BASOSABS 0.1 05/13/2021 1202    Recent Results (from the past 240 hour(s))  Resp Panel by RT-PCR (Flu A&B, Covid) Nasopharyngeal Swab     Status: None   Collection Time: 05/13/21 12:39 PM   Specimen: Nasopharyngeal Swab; Nasopharyngeal(NP) swabs in vial transport medium  Result Value Ref Range Status   SARS Coronavirus 2 by RT PCR NEGATIVE NEGATIVE Final    Comment: (NOTE) SARS-CoV-2 target nucleic acids are NOT  DETECTED.  The SARS-CoV-2 RNA is generally detectable in upper respiratory specimens during the acute phase of infection. The lowest concentration of SARS-CoV-2 viral copies this assay can detect is 138 copies/mL. A negative result does not preclude SARS-Cov-2 infection and should not be used as the sole basis for treatment or other patient management decisions. A negative result may occur with  improper specimen collection/handling, submission of specimen other than nasopharyngeal swab, presence of viral mutation(s) within the areas targeted by this assay, and inadequate number of  viral copies(<138 copies/mL). A negative result must be combined with clinical observations, patient history, and epidemiological information. The expected result is Negative.  Fact Sheet for Patients:  EntrepreneurPulse.com.au  Fact Sheet for Healthcare Providers:  IncredibleEmployment.be  This test is no t yet approved or cleared by the [place] FDA and  has been authorized for detection and/or diagnosis of SARS-CoV-2 by FDA under an Emergency Use Authorization (EUA). This EUA will remain  in effect (meaning this test can be used) for the duration of the COVID-19 declaration under Section 564(b)(1) of the Act, 21 U.S.C.section 360bbb-3(b)(1), unless the authorization is terminated  or revoked sooner.       Influenza A by PCR NEGATIVE NEGATIVE Final   Influenza B by PCR NEGATIVE NEGATIVE Final    Comment: (NOTE) The Xpert Xpress SARS-CoV-2/FLU/RSV plus assay is intended as an aid in the diagnosis of influenza from Nasopharyngeal swab specimens and should not be used as a sole basis for treatment. Nasal washings and aspirates are unacceptable for Xpert Xpress SARS-CoV-2/FLU/RSV testing.  Fact Sheet for Patients: EntrepreneurPulse.com.au  Fact Sheet for Healthcare Providers: IncredibleEmployment.be  This test is not yet approved or cleared by the [place] FDA and has been authorized for detection and/or diagnosis of SARS-CoV-2 by FDA under an Emergency Use Authorization (EUA). This EUA will remain in effect (meaning this test can be used) for the duration of the COVID-19 declaration under Section 564(b)(1) of the Act, 21 U.S.C. section 360bbb-3(b)(1), unless the authorization is terminated or revoked.  Performed at [facility], [place] [address], [place], [place] [address]     Studies/Results: No results found.  Medications: Scheduled Meds:  buPROPion  150 mg Oral BID    enoxaparin (LOVENOX) injection  40 mg Subcutaneous Q24H   feeding supplement  237 mL Oral BID BM   folic acid  1 mg Oral Daily   HYDROmorphone   Intravenous Q4H   hydroxyurea  500 mg Oral BID   ketorolac  15 mg Intravenous Q6H   morphine  45 mg Oral BID   naloxegol oxalate  25 mg Oral Daily   senna-docusate  1 tablet Oral BID   Continuous Infusions:  sodium chloride 10 mL/hr at 05/17/21 0331   PRN Meds:.diphenhydrAMINE, morphine, naloxone **AND** sodium chloride flush, ondansetron, polyethylene glycol, tiZANidine  Consultants: None  Procedures: None  Antibiotics: None  Assessment/Plan: Principal Problem:   Sickle cell pain crisis (HCC) Active Problems:   Anemia of chronic disease   History of tobacco abuse   Chronic pain syndrome  Hb Sickle Cell Disease with Pain crisis: Continue IVF at [facility], continue weight based Dilaudid PCA at current setting, continue IV Toradol 15 mg Q 6 H for total of 5 days, continue oral home pain medications as ordered.  Monitor vitals very closely, Re-evaluate pain scale regularly, 2 L of Oxygen by [place]. Leukocytosis: Significantly improved. Patient remains afebrile. We will continue to monitor closely. Anemia of Chronic Disease: Hemoglobin is stable at baseline  today.  There is no clinical indication for blood transfusion at this time.  We will continue to monitor closely and transfuse as appropriate. Chronic pain Syndrome: Continue home pain medications as ordered. Tobacco use disorder: [name] was counseled on the dangers of tobacco use, and was advised to quit. Reviewed strategies to maximize success, including removing cigarettes and smoking materials from environment, stress management and support of family/friends.   Code Status: Full Code Family Communication: N/A Disposition Plan: For possible discharge home tomorrow.  Hendrick Pavich  If 7PM-7AM, please contact night-coverage.  05/17/2021, 1:40 PM  LOS: 4 days

## 2021-05-18 DIAGNOSIS — D5700 Hb-SS disease with crisis, unspecified: Secondary | ICD-10-CM | POA: Diagnosis not present

## 2021-05-18 DIAGNOSIS — Z87891 Personal history of nicotine dependence: Secondary | ICD-10-CM | POA: Diagnosis not present

## 2021-05-18 DIAGNOSIS — D638 Anemia in other chronic diseases classified elsewhere: Secondary | ICD-10-CM | POA: Diagnosis not present

## 2021-05-18 DIAGNOSIS — G894 Chronic pain syndrome: Secondary | ICD-10-CM | POA: Diagnosis not present

## 2021-05-18 NOTE — Progress Notes (Signed)
Discharge instructions explained to [facility]. Next doses of medications due are written on medication sheet. No prescriptions needed. IV discontinued. Patient discharged via wheelchair, his dad was going to pick him up.

## 2021-05-18 NOTE — Discharge Summary (Signed)
Physician Discharge Summary  [name] VQM:[id number] DOB: 16-May-1975 DOA: 05/13/2021  PCP: Nolene Ebbs, MD  Admit date: 05/13/2021  Discharge date: 05/18/2021  Discharge Diagnoses:  Principal Problem:   Sickle cell pain crisis ([place]) Active Problems:   Anemia of chronic disease   History of tobacco abuse   Chronic pain syndrome   Discharge Condition: Stable  Disposition:   Follow-up Information     Nolene Ebbs, MD. Schedule an appointment as soon as possible for a visit in 1 week(s).   Specialty: Internal Medicine Contact information: [address]  [place] [address] [phone]                Pt is discharged home in good condition and is to follow up with Nolene Ebbs, MD this week to have labs evaluated. [name] is instructed to increase activity slowly and balance with rest for the next few days, and use prescribed medication to complete treatment of pain  Diet: Regular Wt Readings from Last 3 Encounters:  05/13/21 74.8 kg  10/07/20 79.4 kg  09/09/20 80.5 kg    History of present illness:  [name]  is a 46 y.o. male with a medical history significant for sickle cell disease, chronic pain syndrome, opiate dependence and tolerance, history of tobacco use, and history of anemia of chronic disease presents with complaints of generalized pain that is consistent with her typical pain crisis.  Patient states that pain intensity began escalating 4 days prior and has been mostly unrelieved by his home medications.  Patient endorses some chest discomfort that is consistent with previous pain crises.  Home medications consist of MS Contin and MSIR.  Patient has been taking these medications without sustained relief.  He attributes current pain crisis to changes in weather.  He denies any headache, dizziness, or shortness of breath.  No urinary symptoms, nausea, vomiting, or diarrhea.  No sick contacts, recent travel, or known exposure to  COVID-19.     ER Course:  Vital signs show: BP 110/76    Pulse 72    Temp 98.8 F (37.1 C) (Oral)    Resp 18    Ht 6' 2"  (1.88 m)    Wt 74.8 kg    SpO2 96%    BMI 21.18 kg/m  Complete blood count shows WBCs 12.7, hemoglobin 9.9 g/dL, and platelets 343,000.  Complete metabolic panel unremarkable.  Chest x-ray shows no acute cardiopulmonary process.  Patient negative for COVID-19 and influenza.  His pain persists despite IV Dilaudid, IV Toradol, and IV fluids.  Patient will thereby be admitted to [facility] for further management of sickle cell pain crisis.  Hospital Course:  Patient was admitted for sickle cell pain crisis and managed appropriately with IVF, IV Dilaudid via PCA and IV Toradol, as well as other adjunct therapies per sickle cell pain management protocols.  Patient did well on above regimen, hemoglobin remained stable at baseline and he did not require any blood transfusion during this admission.  Pain slowly returned to baseline.  White cell count improved to almost within normal limits.  As at today, patient is ambulating well with no significant pain and tolerating p.o. intake with no restrictions.  Patient desires to be discharged home today.  He does not need any pain medication refills. Patient was therefore discharged home today in a hemodynamically stable condition.   [name] will follow-up with PCP within 1 week of this discharge. [name] was counseled extensively about nonpharmacologic means of pain management, patient verbalized  understanding and was appreciative of  the care received during this admission.   We discussed the need for good hydration, monitoring of hydration status, avoidance of heat, cold, stress, and infection triggers. We discussed the need to be adherent with taking Hydrea and other home medications. Patient was reminded of the need to seek medical attention immediately if any symptom of bleeding, anemia, or infection occurs.  Discharge Exam: Vitals:    05/18/21 0801 05/18/21 1038  BP:  116/77  Pulse:  62  Resp: 14 12  Temp:  98 F (36.7 C)  SpO2: 98% 100%   Vitals:   05/18/21 0205 05/18/21 0400 05/18/21 0801 05/18/21 1038  BP: 113/71   116/77  Pulse: 73   62  Resp: 18 14 14 12   Temp: 97.9 F (36.6 C)   98 F (36.7 C)  TempSrc: Oral   Oral  SpO2: 97% 98% 98% 100%  Weight:      Height:        General appearance : Awake, alert, not in any distress. Speech Clear. Not toxic looking HEENT: Atraumatic and Normocephalic, pupils equally reactive to light and accomodation Neck: Supple, no JVD. No cervical lymphadenopathy.  Chest: Good air entry bilaterally, no added sounds  CVS: S1 S2 regular, no murmurs.  Abdomen: Bowel sounds present, Non tender and not distended with no gaurding, rigidity or rebound. Extremities: B/L Lower Ext shows no edema, both legs are warm to touch Neurology: Awake alert, and oriented X 3, CN II-XII intact, Non focal Skin: No Rash  Discharge Instructions  Discharge Instructions     Diet - low sodium heart healthy   Complete by: As directed    Increase activity slowly   Complete by: As directed       Allergies as of 05/18/2021   No Known Allergies      Medication List     TAKE these medications    buPROPion 150 MG 12 hr tablet Commonly known as: ZYBAN Take 150 mg by mouth 2 (two) times daily.   diphenhydrAMINE 25 MG tablet Commonly known as: BENADRYL Take 25 mg by mouth every 6 (six) hours as needed for itching.   folic acid 1 MG tablet Commonly known as: FOLVITE Take 1 mg by mouth daily.   hydroxyurea 500 MG capsule Commonly known as: HYDREA Take 500 mg by mouth 2 (two) times daily. May take with food to minimize GI side effects.   ibuprofen 800 MG tablet Commonly known as: ADVIL Take 800 mg by mouth every 8 (eight) hours as needed for moderate pain.   morphine 30 MG tablet Commonly known as: MSIR Take 1 tablet (30 mg total) by mouth daily as needed for severe pain (pain).  Resume after completing dilaudid.   morphine 15 MG 12 hr tablet Commonly known as: MS CONTIN Take 45 mg by mouth 2 (two) times daily.   Movantik 25 MG Tabs tablet Generic drug: naloxegol oxalate Take 25 mg by mouth daily.   Narcan 4 MG/0.1ML Liqd nasal spray kit Generic drug: naloxone Place 4 mg into the nose once as needed (overdose).   promethazine 25 MG tablet Commonly known as: PHENERGAN Take 25 mg by mouth every 6 (six) hours as needed for nausea.   tiZANidine 4 MG tablet Commonly known as: ZANAFLEX Take 4 mg by mouth daily as needed for muscle spasms.   Vitamin D (Ergocalciferol) 1.25 MG (50000 UNIT) Caps capsule Commonly known as: DRISDOL Take 1 capsule by mouth once a week. Sundays  The results of significant diagnostics from this hospitalization (including imaging, microbiology, ancillary and laboratory) are listed below for reference.    Significant Diagnostic Studies: DG Chest Port 1 View  Result Date: 05/13/2021 CLINICAL DATA:  Cough EXAM: PORTABLE CHEST 1 VIEW COMPARISON:  09/06/2020 FINDINGS: Stable cardiomediastinal contours. Diffuse coarsened interstitial markings are identified bilaterally compatible with emphysema. Chronic bibasilar reticular interstitial markings, right greater than left. No superimposed airspace consolidation, atelectasis or pneumothorax. IMPRESSION: 1. Emphysema. 2. Chronic bibasilar reticular interstitial markings, right greater than left. Electronically Signed   By: Kerby Moors M.D.   On: 05/13/2021 13:13    Microbiology: Recent Results (from the past 240 hour(s))  Resp Panel by RT-PCR (Flu A&B, Covid) Nasopharyngeal Swab     Status: None   Collection Time: 05/13/21 12:39 PM   Specimen: Nasopharyngeal Swab; Nasopharyngeal(NP) swabs in vial transport medium  Result Value Ref Range Status   SARS Coronavirus 2 by RT PCR NEGATIVE NEGATIVE Final    Comment: (NOTE) SARS-CoV-2 target nucleic acids are NOT DETECTED.  The  SARS-CoV-2 RNA is generally detectable in upper respiratory specimens during the acute phase of infection. The lowest concentration of SARS-CoV-2 viral copies this assay can detect is 138 copies/mL. A negative result does not preclude SARS-Cov-2 infection and should not be used as the sole basis for treatment or other patient management decisions. A negative result may occur with  improper specimen collection/handling, submission of specimen other than nasopharyngeal swab, presence of viral mutation(s) within the areas targeted by this assay, and inadequate number of viral copies(<138 copies/mL). A negative result must be combined with clinical observations, patient history, and epidemiological information. The expected result is Negative.  Fact Sheet for Patients:  EntrepreneurPulse.com.au  Fact Sheet for Healthcare Providers:  IncredibleEmployment.be  This test is no t yet approved or cleared by the [place] FDA and  has been authorized for detection and/or diagnosis of SARS-CoV-2 by FDA under an Emergency Use Authorization (EUA). This EUA will remain  in effect (meaning this test can be used) for the duration of the COVID-19 declaration under Section 564(b)(1) of the Act, 21 U.S.C.section 360bbb-3(b)(1), unless the authorization is terminated  or revoked sooner.       Influenza A by PCR NEGATIVE NEGATIVE Final   Influenza B by PCR NEGATIVE NEGATIVE Final    Comment: (NOTE) The Xpert Xpress SARS-CoV-2/FLU/RSV plus assay is intended as an aid in the diagnosis of influenza from Nasopharyngeal swab specimens and should not be used as a sole basis for treatment. Nasal washings and aspirates are unacceptable for Xpert Xpress SARS-CoV-2/FLU/RSV testing.  Fact Sheet for Patients: EntrepreneurPulse.com.au  Fact Sheet for Healthcare Providers: IncredibleEmployment.be  This test is not yet approved or  cleared by the [place] FDA and has been authorized for detection and/or diagnosis of SARS-CoV-2 by FDA under an Emergency Use Authorization (EUA). This EUA will remain in effect (meaning this test can be used) for the duration of the COVID-19 declaration under Section 564(b)(1) of the Act, 21 U.S.C. section 360bbb-3(b)(1), unless the authorization is terminated or revoked.  Performed at [facility], [place] [address] [place], [place] [address]      Labs: Basic Metabolic Panel: Recent Labs  Lab 05/13/21 1202 05/14/21 0500  NA 136 136  K 4.2 4.3  CL 104 102  CO2 27 27  GLUCOSE 100* 104*  BUN 17 25*  CREATININE 1.05 1.19  CALCIUM 9.2 9.1   Liver Function Tests: Recent Labs  Lab 05/13/21 1202  AST 29  ALT 26  ALKPHOS 96  BILITOT 1.8*  PROT 8.7*  ALBUMIN 4.7   No results for input(s): LIPASE, AMYLASE in the last 168 hours. No results for input(s): AMMONIA in the last 168 hours. CBC: Recent Labs  Lab 05/13/21 1202 05/14/21 0500 05/15/21 0447 05/17/21 0546  WBC 12.7* 17.4* 15.0* 11.8*  NEUTROABS 8.7*  --   --   --   HGB 9.9* 9.5* 8.2* 8.1*  HCT 26.7* 25.9* 22.6* 21.6*  MCV 92.1 93.5 93.4 91.5  PLT 343 300 266 236   Cardiac Enzymes: No results for input(s): CKTOTAL, CKMB, CKMBINDEX, TROPONINI in the last 168 hours. BNP: Invalid input(s): POCBNP CBG: No results for input(s): GLUCAP in the last 168 hours.  Time coordinating discharge: 50 minutes  Signed:  [facility] 05/18/2021, 12:34 PM

## 2021-10-15 ENCOUNTER — Encounter (HOSPITAL_COMMUNITY): Payer: Self-pay

## 2021-10-15 ENCOUNTER — Emergency Department (HOSPITAL_COMMUNITY): Payer: Medicare Other

## 2021-10-15 ENCOUNTER — Other Ambulatory Visit: Payer: Self-pay

## 2021-10-15 ENCOUNTER — Inpatient Hospital Stay (HOSPITAL_COMMUNITY)
Admission: EM | Admit: 2021-10-15 | Discharge: 2021-10-18 | DRG: 812 | Disposition: A | Discharge status: Home / Self Care | Payer: Medicare Other | Attending: Internal Medicine | Admitting: Internal Medicine

## 2021-10-15 DIAGNOSIS — G894 Chronic pain syndrome: Secondary | ICD-10-CM | POA: Diagnosis present

## 2021-10-15 DIAGNOSIS — F1120 Opioid dependence, uncomplicated: Secondary | ICD-10-CM | POA: Diagnosis present

## 2021-10-15 DIAGNOSIS — Z79899 Other long term (current) drug therapy: Secondary | ICD-10-CM | POA: Diagnosis not present

## 2021-10-15 DIAGNOSIS — D5700 Hb-SS disease with crisis, unspecified: Secondary | ICD-10-CM | POA: Diagnosis present

## 2021-10-15 DIAGNOSIS — Z96641 Presence of right artificial hip joint: Secondary | ICD-10-CM | POA: Diagnosis present

## 2021-10-15 DIAGNOSIS — D5701 Hb-SS disease with acute chest syndrome: Principal | ICD-10-CM | POA: Diagnosis present

## 2021-10-15 DIAGNOSIS — F17210 Nicotine dependence, cigarettes, uncomplicated: Secondary | ICD-10-CM | POA: Diagnosis present

## 2021-10-15 DIAGNOSIS — D638 Anemia in other chronic diseases classified elsewhere: Secondary | ICD-10-CM | POA: Diagnosis present

## 2021-10-15 DIAGNOSIS — D72829 Elevated white blood cell count, unspecified: Secondary | ICD-10-CM | POA: Diagnosis present

## 2021-10-15 DIAGNOSIS — Z87891 Personal history of nicotine dependence: Secondary | ICD-10-CM

## 2021-10-15 LAB — COMPREHENSIVE METABOLIC PANEL
ALT: 18 U/L (ref 0–44)
AST: 35 U/L (ref 15–41)
Albumin: 4.7 g/dL (ref 3.5–5.0)
Alkaline Phosphatase: 90 U/L (ref 38–126)
Anion gap: 10 (ref 5–15)
BUN: 22 mg/dL — ABNORMAL HIGH (ref 6–20)
CO2: 21 mmol/L — ABNORMAL LOW (ref 22–32)
Calcium: 9.4 mg/dL (ref 8.9–10.3)
Chloride: 107 mmol/L (ref 98–111)
Creatinine, Ser: 1.08 mg/dL (ref 0.61–1.24)
GFR, Estimated: >60 mL/min (ref >60)
Glucose, Bld: 82 mg/dL (ref 70–99)
Potassium: 4.4 mmol/L (ref 3.5–5.1)
Sodium: 138 mmol/L (ref 135–145)
Total Bilirubin: 1.8 mg/dL — ABNORMAL HIGH (ref 0.3–1.2)
Total Protein: 8.6 g/dL — ABNORMAL HIGH (ref 6.5–8.1)

## 2021-10-15 LAB — CBC WITH DIFFERENTIAL/PLATELET
Abs Immature Granulocytes: 0.08 10*3/uL — ABNORMAL HIGH (ref 0.00–0.07)
Basophils Absolute: 0.1 10*3/uL (ref 0.0–0.1)
Basophils Relative: 1 %
Eosinophils Absolute: 0.7 10*3/uL — ABNORMAL HIGH (ref 0.0–0.5)
Eosinophils Relative: 5 %
HCT: 25.1 % — ABNORMAL LOW (ref 39.0–52.0)
Hemoglobin: 9.3 g/dL — ABNORMAL LOW (ref 13.0–17.0)
Immature Granulocytes: 1 %
Lymphocytes Relative: 24 %
Lymphs Abs: 3.6 10*3/uL (ref 0.7–4.0)
MCH: 33.9 pg (ref 26.0–34.0)
MCHC: 37.1 g/dL — ABNORMAL HIGH (ref 30.0–36.0)
MCV: 91.6 fL (ref 80.0–100.0)
Monocytes Absolute: 1.4 10*3/uL — ABNORMAL HIGH (ref 0.1–1.0)
Monocytes Relative: 9 %
Neutro Abs: 9.1 10*3/uL — ABNORMAL HIGH (ref 1.7–7.7)
Neutrophils Relative %: 60 %
Platelets: 286 10*3/uL (ref 150–400)
RBC: 2.74 MIL/uL — ABNORMAL LOW (ref 4.22–5.81)
RDW: 13.4 % (ref 11.5–15.5)
WBC: 15.1 10*3/uL — ABNORMAL HIGH (ref 4.0–10.5)
nRBC: 0.1 % (ref 0.0–0.2)

## 2021-10-15 LAB — RETICULOCYTES
Immature Retic Fract: 25.4 % — ABNORMAL HIGH (ref 2.3–15.9)
RBC.: 2.74 MIL/uL — ABNORMAL LOW (ref 4.22–5.81)
Retic Count, Absolute: 62.7 10*3/uL (ref 19.0–186.0)
Retic Ct Pct: 2.3 % (ref 0.4–3.1)

## 2021-10-15 MED ORDER — ENOXAPARIN SODIUM 40 MG/0.4ML IJ SOSY
40.0000 mg | PREFILLED_SYRINGE | INTRAMUSCULAR | Status: DC
  Administered 2021-10-15 – 2021-10-16 (×2): 40 mg via SUBCUTANEOUS
  Filled 2021-10-15 (×3): qty 0.4

## 2021-10-15 MED ORDER — SODIUM CHLORIDE 0.45 % IV SOLN: INTRAVENOUS | Status: DC

## 2021-10-15 MED ORDER — KETOROLAC TROMETHAMINE 15 MG/ML IJ SOLN
15.0000 mg | Freq: Four times a day (QID) | INTRAMUSCULAR | Status: DC
  Administered 2021-10-15 – 2021-10-18 (×12): 15 mg via INTRAVENOUS
  Filled 2021-10-15 (×12): qty 1

## 2021-10-15 MED ORDER — ONDANSETRON HCL 4 MG/2ML IJ SOLN
4.0000 mg | Freq: Four times a day (QID) | INTRAMUSCULAR | Status: DC | PRN
  Filled 2021-10-15: qty 2

## 2021-10-15 MED ORDER — HYDROMORPHONE HCL 1 MG/ML IJ SOLN
1.0000 mg | INTRAMUSCULAR | Status: AC
  Administered 2021-10-15: 1 mg via INTRAVENOUS
  Filled 2021-10-15: qty 1

## 2021-10-15 MED ORDER — DIPHENHYDRAMINE HCL 25 MG PO CAPS
25.0000 mg | ORAL_CAPSULE | ORAL | Status: DC | PRN
  Administered 2021-10-15: 25 mg via ORAL
  Filled 2021-10-15: qty 1

## 2021-10-15 MED ORDER — HYDROMORPHONE HCL 1 MG/ML IJ SOLN
1.0000 mg | Freq: Once | INTRAMUSCULAR | Status: AC
  Administered 2021-10-15: 1 mg via INTRAVENOUS
  Filled 2021-10-15: qty 1

## 2021-10-15 MED ORDER — DIPHENHYDRAMINE HCL 50 MG/ML IJ SOLN
12.5000 mg | Freq: Once | INTRAMUSCULAR | Status: AC
Start: 2021-10-15 — End: 2021-10-15
  Administered 2021-10-15: 12.5 mg via INTRAVENOUS
  Filled 2021-10-15: qty 12.5

## 2021-10-15 MED ORDER — BUPROPION HCL ER (SMOKING DET) 150 MG PO TB12: 150.0000 mg | ORAL_TABLET | Freq: Two times a day (BID) | ORAL | Status: DC

## 2021-10-15 MED ORDER — SODIUM CHLORIDE 0.9% FLUSH: 9.0000 mL | INTRAVENOUS | Status: DC | PRN

## 2021-10-15 MED ORDER — SODIUM CHLORIDE 0.9 % IV SOLN
1.0000 g | Freq: Once | INTRAVENOUS | Status: AC
  Administered 2021-10-15: 1 g via INTRAVENOUS
  Filled 2021-10-15: qty 10

## 2021-10-15 MED ORDER — ONDANSETRON HCL 4 MG/2ML IJ SOLN
4.0000 mg | INTRAMUSCULAR | Status: DC | PRN
  Administered 2021-10-15 – 2021-10-18 (×11): 4 mg via INTRAVENOUS
  Filled 2021-10-15 (×11): qty 2

## 2021-10-15 MED ORDER — SODIUM CHLORIDE 0.9 % IV SOLN
1.0000 g | INTRAVENOUS | Status: DC
  Administered 2021-10-16 – 2021-10-18 (×3): 1 g via INTRAVENOUS
  Filled 2021-10-15 (×4): qty 10

## 2021-10-15 MED ORDER — FOLIC ACID 1 MG PO TABS
1.0000 mg | ORAL_TABLET | Freq: Every day | ORAL | Status: DC
  Administered 2021-10-15 – 2021-10-18 (×4): 1 mg via ORAL
  Filled 2021-10-15 (×4): qty 1

## 2021-10-15 MED ORDER — KETOROLAC TROMETHAMINE 15 MG/ML IJ SOLN
15.0000 mg | INTRAMUSCULAR | Status: AC
  Administered 2021-10-15: 15 mg via INTRAVENOUS
  Filled 2021-10-15: qty 1

## 2021-10-15 MED ORDER — MORPHINE SULFATE ER 15 MG PO TBCR
45.0000 mg | EXTENDED_RELEASE_TABLET | Freq: Two times a day (BID) | ORAL | Status: DC
  Administered 2021-10-15 – 2021-10-18 (×6): 45 mg via ORAL
  Filled 2021-10-15 (×6): qty 3

## 2021-10-15 MED ORDER — BUPROPION HCL ER (SR) 150 MG PO TB12
150.0000 mg | ORAL_TABLET | Freq: Two times a day (BID) | ORAL | Status: DC
  Administered 2021-10-15 – 2021-10-18 (×6): 150 mg via ORAL
  Filled 2021-10-15 (×6): qty 1

## 2021-10-15 MED ORDER — NALOXONE HCL 0.4 MG/ML IJ SOLN: 0.4000 mg | INTRAMUSCULAR | Status: DC | PRN

## 2021-10-15 MED ORDER — HYDROMORPHONE 1 MG/ML IV SOLN
INTRAVENOUS | Status: DC
  Administered 2021-10-15: 30 mg via INTRAVENOUS
  Administered 2021-10-15: 8 mg via INTRAVENOUS
  Administered 2021-10-16: 3.5 mg via INTRAVENOUS
  Administered 2021-10-16: 5.5 mg via INTRAVENOUS
  Administered 2021-10-16: 3 mg via INTRAVENOUS
  Administered 2021-10-16: 1.5 mg via INTRAVENOUS
  Administered 2021-10-16: 3 mg via INTRAVENOUS
  Administered 2021-10-16: 5.5 mg via INTRAVENOUS
  Administered 2021-10-16: 30 mg via INTRAVENOUS
  Administered 2021-10-16: 1.5 mg via INTRAVENOUS
  Administered 2021-10-17: 3.5 mg via INTRAVENOUS
  Administered 2021-10-17: 1 mg via INTRAVENOUS
  Filled 2021-10-15 (×3): qty 30

## 2021-10-15 MED ORDER — OXYCODONE HCL 5 MG PO TABS
15.0000 mg | ORAL_TABLET | Freq: Once | ORAL | Status: AC
  Administered 2021-10-15: 15 mg via ORAL
  Filled 2021-10-15: qty 3

## 2021-10-15 NOTE — ED Provider Notes (Signed)
[place] DEPT Provider Note   CSN: [id number] Arrival date & time: 10/15/21  0820     History  Chief Complaint  Patient presents with   Sickle Cell Pain Crisis    [name] is a 46 y.o. male.  HPI     46 year old male comes in with chief complaint of sickle cell pain. Patient has history of sickle cell anemia.  Indicates that his current pain started about 3 days ago.  Pain is located over his chest, back, hips.  The pain is similar to his previous sickle cell pain in terms of location and quality of the pain.  He denies any fevers, chills, nausea, vomiting, cough.  He thinks the weather change could have triggered his symptoms.  Patient has taken oral morphine for breakthrough pain without any relief.    Home Medications Prior to Admission medications   Medication Sig Start Date End Date Taking? Authorizing Provider  buPROPion (ZYBAN) 150 MG 12 hr tablet Take 150 mg by mouth 2 (two) times daily. 05/04/21  Yes [provider]  diphenhydrAMINE (BENADRYL) 25 MG tablet Take 25 mg by mouth every 6 (six) hours as needed for itching.   Yes [provider]  ibuprofen (ADVIL,MOTRIN) 800 MG tablet Take 800 mg by mouth every 8 (eight) hours as needed for moderate pain.  09/07/16  Yes [provider]  LINZESS 145 MCG CAPS capsule Take 145 mcg by mouth daily as needed for constipation. 09/28/21  Yes [provider]  morphine (MS CONTIN) 15 MG 12 hr tablet Take 45 mg by mouth 2 (two) times daily. 09/11/20  Yes [provider]  morphine (MSIR) 30 MG tablet Take 1 tablet (30 mg total) by mouth daily as needed for severe pain (pain). Resume after completing dilaudid. Patient taking differently: Take 30 mg by mouth 2 (two) times daily as needed for severe pain (pain). 02/17/16  Yes Leana Gamer, MD  NARCAN 4 MG/0.1ML LIQD nasal spray kit Place 4 mg into the nose once as needed (overdose). 12/16/17  Yes [provider]  promethazine (PHENERGAN) 25 MG tablet Take 25 mg by mouth every 6 (six) hours as needed for nausea.   Yes [provider]  tiZANidine (ZANAFLEX) 4 MG tablet Take 4 mg by mouth daily as needed for muscle spasms. 09/05/20  Yes [provider]      Allergies    Patient has no known allergies.    Review of Systems   Review of Systems  All other systems reviewed and are negative.   Physical Exam Updated Vital Signs BP 101/69   Pulse 64   Temp 98.9 F (37.2 C)   Resp 18   Ht 6' 2" (1.88 m)   Wt 78.5 kg   SpO2 94%   BMI 22.21 kg/m  Physical Exam Vitals and nursing note reviewed.  Constitutional:      Appearance: He is well-developed.  HENT:     Head: Atraumatic.  Eyes:     General: No scleral icterus. Cardiovascular:     Rate and Rhythm: Normal rate.  Pulmonary:     Effort: Pulmonary effort is normal.  Musculoskeletal:     Cervical back: Neck supple.  Skin:    General: Skin is warm.  Neurological:     Mental Status: He is alert and oriented to person, place, and time.     ED Results / Procedures / Treatments   Labs (all labs ordered are listed, but only  abnormal results are displayed) Labs Reviewed  RETICULOCYTES - Abnormal; Notable for the following components:      Result Value   RBC. 2.74 (*)    Immature Retic Fract 25.4 (*)    All other components within normal limits  COMPREHENSIVE METABOLIC PANEL - Abnormal; Notable for the following components:   CO2 21 (*)    BUN 22 (*)    Total Protein 8.6 (*)    Total Bilirubin 1.8 (*)    All other components within normal limits  CBC WITH DIFFERENTIAL/PLATELET - Abnormal; Notable for the following components:   WBC 15.1 (*)    RBC 2.74 (*)    Hemoglobin 9.3 (*)    HCT 25.1 (*)    MCHC 37.1 (*)    Neutro Abs 9.1 (*)    Monocytes Absolute 1.4 (*)    Eosinophils Absolute 0.7 (*)    Abs Immature Granulocytes 0.08 (*)    All other components within normal limits  CULTURE, BLOOD  (ROUTINE X 2)  CULTURE, BLOOD (ROUTINE X 2)    EKG None  Radiology DG Chest Port 1 View  Result Date: 10/15/2021 CLINICAL DATA:  Chest pain.  History of sickle cell disease. EXAM: PORTABLE CHEST 1 VIEW COMPARISON:  May 13, 2021. FINDINGS: The heart size and mediastinal contours are within normal limits. Increased bibasilar opacities are noted concerning for atelectasis or possibly infiltrates. Diffuse sclerosis of visualized skeleton is noted consistent with history of sickle cell disease. IMPRESSION: Increased bibasilar opacities concerning for atelectasis or possibly infiltrates. Electronically Signed   By: James  Green Jr M.D.   On: 10/15/2021 11:57    Procedures .Critical Care  Performed by: Nanavati, Ankit, MD Authorized by: Nanavati, Ankit, MD   Critical care provider statement:    Critical care time (minutes):  35   Critical care was necessary to treat or prevent imminent or life-threatening deterioration of the following conditions: acute chest syndrome.   Critical care was time spent personally by me on the following activities:  Development of treatment plan with patient or surrogate, discussions with consultants, evaluation of patient's response to treatment, examination of patient, ordering and review of laboratory studies, ordering and review of radiographic studies, ordering and performing treatments and interventions, pulse oximetry, re-evaluation of patient's condition and review of old charts     Medications Ordered in ED Medications  ondansetron (ZOFRAN) injection 4 mg (4 mg Intravenous Given 10/15/21 1023)  cefTRIAXone (ROCEPHIN) 1 g in sodium chloride 0.9 % 100 mL IVPB (has no administration in time range)  HYDROmorphone (DILAUDID) injection 1 mg (has no administration in time range)  HYDROmorphone (DILAUDID) injection 1 mg (1 mg Intravenous Given 10/15/21 1024)  HYDROmorphone (DILAUDID) injection 1 mg (1 mg Intravenous Given 10/15/21 1100)  HYDROmorphone  (DILAUDID) injection 1 mg (1 mg Intravenous Given 10/15/21 1133)  oxyCODONE (Oxy IR/ROXICODONE) immediate release tablet 15 mg (15 mg Oral Given 10/15/21 1133)  diphenhydrAMINE (BENADRYL) 12.5 mg in sodium chloride 0.9 % 50 mL IVPB (0 mg Intravenous Stopped 10/15/21 1204)  ketorolac (TORADOL) 15 MG/ML injection 15 mg (15 mg Intravenous Given 10/15/21 1024)    ED Course/ Medical Decision Making/ A&P Clinical Course as of 10/15/21 1257  Wed Oct 15, 2021  1257 Patient reassessed.  Continues to have chest pain.  X-ray visualized and interpreted independently.  There is clear increased opacification right worse than left.  X-ray compared to the x-ray that was done in January.  There is still a small concern for acute chest syndrome,   especially in the setting of chest pain with elevated white count.  We will start him on antibiotics, get blood cultures and admit him to medicine. [AN]    Clinical Course User Index [AN] Varney Biles, MD                           Medical Decision Making Amount and/or Complexity of Data Reviewed Labs: ordered. Radiology: ordered.  Risk Prescription drug management. Decision regarding hospitalization.   46 year old patient comes in with chief complaint of sickle cell pain.  Pain located over the torso, back and pelvis area.  Positive chest pain.  It is not pleuritic.  He has no cough.  He denies shortness of breath, nausea, vomiting, fevers.  At this time, suspicion is high for sickle cell pain.  Other possibilities considered include acute chest syndrome, PE, ACS.  With 3 days of pain that is not pleuritic and no hypoxia, the latter are thought to be less likely at this time, especially since patient states that he has had same pain with sickle cell in the past.  Plan will be to give him some IV pain medications and reassess.  Basic labs and chest x-ray, EKG ordered.   Final Clinical Impression(s) / ED Diagnoses Final diagnoses:  Acute chest syndrome ([place])   Sickle cell pain crisis [facility])    Rx / DC Orders ED Discharge Orders     None         Varney Biles, MD 10/15/21 1258

## 2021-10-15 NOTE — H&P (Incomplete)
H&P  Patient Demographics:  [name], is a 46 y.o. male  MRN: [id number]   DOB - 1975/11/05  Admit Date - 10/15/2021  Outpatient Primary MD for the patient is Nolene Ebbs, MD  Chief Complaint  Patient presents with   Sickle Cell Pain Crisis      HPI:   [name]  is a 46 y.o. male     ER course: Vital signs show: BP 109/78   Pulse 75   Temp 98.9 F (37.2 C)   Resp 18   Ht 6' 2"  (1.88 m)   Wt 78.5 kg   SpO2 95%   BMI 22.21 kg/m    Review of systems:  Review of Systems  Constitutional:  Positive for malaise/fatigue. Negative for chills and fever.  HENT: Negative.    Eyes: Negative.   Respiratory: Negative.    Cardiovascular:  Positive for chest pain.  Gastrointestinal: Negative.   Genitourinary: Negative.   Musculoskeletal:  Positive for back pain.  Neurological: Negative.     With Past History of the following :   Past Medical History:  Diagnosis Date   Avascular necrosis of hip ([place])    bilateral   Avascular necrosis of hip, left ([place]) 08/27/2011   Blood transfusion    Infection of bone, shoulder region [facility])    left shoulder   Pneumonia    Sickle cell crisis [facility])       Past Surgical History:  Procedure Laterality Date   BONE GRAFT HIP ILIAC CREST     JOINT REPLACEMENT  2006   right total hip arthroplasty   Orif right hip fracture  1995     Social History:   Social History   Tobacco Use   Smoking status: Some Days    Packs/day: 0.50    Types: Cigarettes    Last attempt to quit: 01/27/2012    Years since quitting: 9.7   Smokeless tobacco: Never  Substance Use Topics   Alcohol use: No     Lives - At home   Family History :   Family History  Adopted: Yes     Home Medications:   Prior to Admission medications   Medication Sig Start Date End Date Taking? Authorizing Provider  buPROPion (ZYBAN) 150 MG 12 hr tablet Take 150 mg by mouth 2 (two) times daily. 05/04/21  Yes [provider]  diphenhydrAMINE (BENADRYL) 25 MG  tablet Take 25 mg by mouth every 6 (six) hours as needed for itching.   Yes [provider]  ibuprofen (ADVIL,MOTRIN) 800 MG tablet Take 800 mg by mouth every 8 (eight) hours as needed for moderate pain.  09/07/16  Yes [provider]  LINZESS 145 MCG CAPS capsule Take 145 mcg by mouth daily as needed for constipation. 09/28/21  Yes [provider]  morphine (MS CONTIN) 15 MG 12 hr tablet Take 45 mg by mouth 2 (two) times daily. 09/11/20  Yes [provider]  morphine (MSIR) 30 MG tablet Take 1 tablet (30 mg total) by mouth daily as needed for severe pain (pain). Resume after completing dilaudid. Patient taking differently: Take 30 mg by mouth 2 (two) times daily as needed for severe pain (pain). 02/17/16  Yes Leana Gamer, MD  NARCAN 4 MG/0.1ML LIQD nasal spray kit Place 4 mg into the nose once as needed (overdose). 12/16/17  Yes [provider]  promethazine (PHENERGAN) 25 MG tablet Take 25 mg by mouth every 6 (six) hours as needed for nausea.  Yes [provider]  tiZANidine (ZANAFLEX) 4 MG tablet Take 4 mg by mouth daily as needed for muscle spasms. 09/05/20  Yes [provider]     Allergies:   No Known Allergies   Physical Exam:   Vitals:   Vitals:   10/15/21 1230 10/15/21 1300  BP: 101/69 109/78  Pulse: 64 75  Resp: 18 18  Temp:    SpO2: 94% 95%    Physical Exam: Constitutional: Patient appears well-developed and well-nourished. Not in obvious distress. HENT: Normocephalic, atraumatic, External right and left ear normal. Oropharynx is clear and moist.  Eyes: Conjunctivae and EOM are normal. PERRLA, no scleral icterus. Neck: Normal ROM. Neck supple. No JVD. No tracheal deviation. No thyromegaly. CVS: RRR, S1/S2 +, no murmurs, no gallops, no carotid bruit.  Pulmonary: Effort and breath sounds normal, no stridor, rhonchi, wheezes, rales.  Abdominal: Soft. BS +, no distension, tenderness, rebound or guarding.   Musculoskeletal: Normal range of motion. No edema and no tenderness.  Lymphadenopathy: No lymphadenopathy noted, cervical, inguinal or axillary Neuro: Alert. Normal reflexes, muscle tone coordination. No cranial nerve deficit. Skin: Skin is warm and dry. No rash noted. Not diaphoretic. No erythema. No pallor. Psychiatric: Normal mood and affect. Behavior, judgment, thought content normal.   Data Review:   CBC Recent Labs  Lab 10/15/21 0912  WBC 15.1*  HGB 9.3*  HCT 25.1*  PLT 286  MCV 91.6  MCH 33.9  MCHC 37.1*  RDW 13.4  LYMPHSABS 3.6  MONOABS 1.4*  EOSABS 0.7*  BASOSABS 0.1   ------------------------------------------------------------------------------------------------------------------  Chemistries  Recent Labs  Lab 10/15/21 0912  NA 138  K 4.4  CL 107  CO2 21*  GLUCOSE 82  BUN 22*  CREATININE 1.08  CALCIUM 9.4  AST 35  ALT 18  ALKPHOS 90  BILITOT 1.8*   ------------------------------------------------------------------------------------------------------------------ estimated creatinine clearance is 94.9 mL/min (by C-G formula based on SCr of 1.08 mg/dL). ------------------------------------------------------------------------------------------------------------------ No results for input(s): "TSH", "T4TOTAL", "T3FREE", "THYROIDAB" in the last 72 hours.  Invalid input(s): "FREET3"  Coagulation profile No results for input(s): "INR", "PROTIME" in the last 168 hours. ------------------------------------------------------------------------------------------------------------------- No results for input(s): "DDIMER" in the last 72 hours. -------------------------------------------------------------------------------------------------------------------  Cardiac Enzymes No results for input(s): "CKMB", "TROPONINI", "MYOGLOBIN" in the last 168 hours.  Invalid input(s):  "CK" ------------------------------------------------------------------------------------------------------------------ No results found for: "BNP"  ---------------------------------------------------------------------------------------------------------------  Urinalysis    Component Value Date/Time   COLORURINE YELLOW 12/28/2018 [place] 12/28/2018 1831   LABSPEC >1.046 (H) 12/28/2018 1831   PHURINE 5.0 12/28/2018 1831   GLUCOSEU NEGATIVE 12/28/2018 1831   HGBUR NEGATIVE 12/28/2018 1831   BILIRUBINUR NEGATIVE 12/28/2018 1831   KETONESUR NEGATIVE 12/28/2018 1831   PROTEINUR NEGATIVE 12/28/2018 1831   UROBILINOGEN 0.2 10/25/2013 0020   NITRITE NEGATIVE 12/28/2018 1831   LEUKOCYTESUR NEGATIVE 12/28/2018 1831    ----------------------------------------------------------------------------------------------------------------   Imaging Results:    DG Chest Port 1 View  Result Date: 10/15/2021 CLINICAL DATA:  Chest pain.  History of sickle cell disease. EXAM: PORTABLE CHEST 1 VIEW COMPARISON:  May 13, 2021. FINDINGS: The heart size and mediastinal contours are within normal limits. Increased bibasilar opacities are noted concerning for atelectasis or possibly infiltrates. Diffuse sclerosis of visualized skeleton is noted consistent with history of sickle cell disease. IMPRESSION: Increased bibasilar opacities concerning for atelectasis or possibly infiltrates. Electronically Signed   By: Marijo Conception M.D.   On: 10/15/2021 11:57     Assessment & Plan:  Principal Problem:   Sickle cell  disease with crisis [facility]) Active Problems:   Leukocytosis   History of tobacco abuse   Acute chest syndrome (HCC)   Hb Sickle Cell Disease with crisis: Admit patient, start IVF D5 .45% Saline @ 125 mls/hour, start weight based Dilaudid PCA, start IV Toradol 15 mg Q 6 H, Restart oral home pain medications, Monitor vitals very closely, Re-evaluate pain scale regularly, 2 L of  Oxygen by [place], Patient will be re-evaluated for pain in the context of function and relationship to baseline as care progresses. Leukocytosis:  Anemia of Chronic Disease:  Chronic pain Syndrome:    DVT Prophylaxis: Subcut Lovenox   AM Labs Ordered, also please review Full Orders  Family Communication: Admission, patient's condition and plan of care including tests being ordered have been discussed with the patient who indicate understanding and agree with the plan and Code Status.  Code Status: Full Code  Consults called: None    Admission status: Inpatient    Time spent in minutes : 50 minutes  [place], MSN, FNP-C [facility] [address] [place], [place] [address] [phone]  10/15/2021 at 1:07 PM

## 2021-10-15 NOTE — ED Triage Notes (Signed)
Pt c/o three days of worsening sickle cell pain "in the middle of my body". Pt states he is also having CP. Denies SOB, N/V. Pain uncontrolled by oral PRN medications at home.

## 2021-10-15 NOTE — Plan of Care (Signed)
  Problem: Education: Goal: Awareness of signs and symptoms of anemia will improve Outcome: Progressing   

## 2021-10-16 LAB — BASIC METABOLIC PANEL
Anion gap: 6 (ref 5–15)
BUN: 22 mg/dL — ABNORMAL HIGH (ref 6–20)
CO2: 26 mmol/L (ref 22–32)
Calcium: 9.5 mg/dL (ref 8.9–10.3)
Chloride: 108 mmol/L (ref 98–111)
Creatinine, Ser: 1.27 mg/dL — ABNORMAL HIGH (ref 0.61–1.24)
GFR, Estimated: >60 mL/min (ref >60)
Glucose, Bld: 115 mg/dL — ABNORMAL HIGH (ref 70–99)
Potassium: 4.6 mmol/L (ref 3.5–5.1)
Sodium: 140 mmol/L (ref 135–145)

## 2021-10-16 LAB — CBC
HCT: 24.4 % — ABNORMAL LOW (ref 39.0–52.0)
Hemoglobin: 8.9 g/dL — ABNORMAL LOW (ref 13.0–17.0)
MCH: 33 pg (ref 26.0–34.0)
MCHC: 36.5 g/dL — ABNORMAL HIGH (ref 30.0–36.0)
MCV: 90.4 fL (ref 80.0–100.0)
Platelets: 255 10*3/uL (ref 150–400)
RBC: 2.7 MIL/uL — ABNORMAL LOW (ref 4.22–5.81)
RDW: 14.4 % (ref 11.5–15.5)
WBC: 15.6 10*3/uL — ABNORMAL HIGH (ref 4.0–10.5)
nRBC: 0 % (ref 0.0–0.2)

## 2021-10-16 MED ORDER — LINACLOTIDE 145 MCG PO CAPS
145.0000 ug | ORAL_CAPSULE | Freq: Once | ORAL | Status: AC
  Administered 2021-10-16: 145 ug via ORAL
  Filled 2021-10-16: qty 1

## 2021-10-16 NOTE — Progress Notes (Signed)
  Transition of Care (TOC) Screening Note   Patient Details  Name: [name] Date of Birth: 1975-11-16   Transition of Care [facility]) CM/SW Contact:    Chawn Spraggins, Meriam Sprague, RN Phone Number: 10/16/2021, 1:28 PM    Transition of Care Department [facility]) has reviewed patient and no TOC needs have been identified at this time. We will continue to monitor patient advancement through interdisciplinary progression rounds. If new patient transition needs arise, please place a TOC consult.

## 2021-10-16 NOTE — Plan of Care (Signed)
  Problem: Self-Care: Goal: Ability to incorporate actions that prevent/reduce pain crisis will improve Outcome: Progressing   

## 2021-10-17 LAB — BASIC METABOLIC PANEL
Anion gap: 5 (ref 5–15)
BUN: 16 mg/dL (ref 6–20)
CO2: 27 mmol/L (ref 22–32)
Calcium: 9 mg/dL (ref 8.9–10.3)
Chloride: 109 mmol/L (ref 98–111)
Creatinine, Ser: 0.94 mg/dL (ref 0.61–1.24)
GFR, Estimated: >60 mL/min (ref >60)
Glucose, Bld: 80 mg/dL (ref 70–99)
Potassium: 4.3 mmol/L (ref 3.5–5.1)
Sodium: 141 mmol/L (ref 135–145)

## 2021-10-17 LAB — CBC
HCT: 22.3 % — ABNORMAL LOW (ref 39.0–52.0)
Hemoglobin: 8.1 g/dL — ABNORMAL LOW (ref 13.0–17.0)
MCH: 33.1 pg (ref 26.0–34.0)
MCHC: 36.3 g/dL — ABNORMAL HIGH (ref 30.0–36.0)
MCV: 91 fL (ref 80.0–100.0)
Platelets: 248 10*3/uL (ref 150–400)
RBC: 2.45 MIL/uL — ABNORMAL LOW (ref 4.22–5.81)
RDW: 14.6 % (ref 11.5–15.5)
WBC: 12.1 10*3/uL — ABNORMAL HIGH (ref 4.0–10.5)
nRBC: 0 % (ref 0.0–0.2)

## 2021-10-17 LAB — HIV ANTIBODY (ROUTINE TESTING W REFLEX): HIV Screen 4th Generation wRfx: NONREACTIVE

## 2021-10-17 MED ORDER — HYDROMORPHONE 1 MG/ML IV SOLN
INTRAVENOUS | Status: DC
  Administered 2021-10-17 (×2): 2.5 mg via INTRAVENOUS
  Administered 2021-10-18: 7.5 mg via INTRAVENOUS
  Administered 2021-10-18: 1 mg via INTRAVENOUS

## 2021-10-17 MED ORDER — MORPHINE SULFATE 30 MG PO TABS
30.0000 mg | ORAL_TABLET | Freq: Four times a day (QID) | ORAL | Status: DC | PRN
  Administered 2021-10-17: 30 mg via ORAL
  Filled 2021-10-17: qty 1

## 2021-10-17 NOTE — H&P (Signed)
Subjective: [name] is a 46 year old male with a medical history significant for sickle cell disease, chronic pain syndrome, opiate dependence and tolerance, and tobacco dependence that was admitted for acute chest syndrome in the setting of sickle cell pain crisis. Patient states that pain continues to his low back and lower extremities.  He rates pain as 7/10.  He denies any shortness of breath, chest pain, urinary symptoms, nausea, vomiting, or diarrhea.  Objective:  Vital signs in last 24 hours:  Vitals:   10/16/21 2146 10/16/21 2342 10/17/21 0244 10/17/21 0417  BP: 115/73  (!) 104/59   Pulse: 71  64   Resp: 18 12 18 11   Temp: 97.9 F (36.6 C)  98.3 F (36.8 C)   TempSrc: Oral  Oral   SpO2: 96% 95% 97% 98%  Weight:      Height:        Intake/Output from previous day:   Intake/Output Summary (Last 24 hours) at 10/17/2021 0715 Last data filed at 10/17/2021 0700 Gross per 24 hour  Intake 1930.71 ml  Output 1900 ml  Net 30.71 ml    Physical Exam: General: Alert, awake, oriented x3, in no acute distress.  HEENT: [place]/AT PEERL, EOMI Neck: Trachea midline,  no masses, no thyromegal,y no JVD, no carotid bruit OROPHARYNX:  Moist, No exudate/ erythema/lesions.  Heart: Regular rate and rhythm, without murmurs, rubs, gallops, PMI non-displaced, no heaves or thrills on palpation.  Lungs: Clear to auscultation, no wheezing or rhonchi noted. No increased vocal fremitus resonant to percussion  Abdomen: Soft, nontender, nondistended, positive bowel sounds, no masses no hepatosplenomegaly noted..  Neuro: No focal neurological deficits noted cranial nerves II through XII grossly intact. DTRs 2+ bilaterally upper and lower extremities. Strength 5 out of 5 in bilateral upper and lower extremities. Musculoskeletal: No warm swelling or erythema around joints, no spinal tenderness noted. Psychiatric: Patient alert and oriented x3, good insight and cognition, good recent to remote  recall. Lymph node survey: No cervical axillary or inguinal lymphadenopathy noted.  Lab Results:  Basic Metabolic Panel:    Component Value Date/Time   NA 140 10/16/2021 0613   K 4.6 10/16/2021 0613   CL 108 10/16/2021 0613   CO2 26 10/16/2021 0613   BUN 22 (H) 10/16/2021 0613   CREATININE 1.27 (H) 10/16/2021 0613   GLUCOSE 115 (H) 10/16/2021 0613   CALCIUM 9.5 10/16/2021 0613   CBC:    Component Value Date/Time   WBC 12.1 (H) 10/17/2021 0627   HGB 8.1 (L) 10/17/2021 0627   HCT 22.3 (L) 10/17/2021 0627   PLT 248 10/17/2021 0627   MCV 91.0 10/17/2021 0627   NEUTROABS 9.1 (H) 10/15/2021 0912   LYMPHSABS 3.6 10/15/2021 0912   MONOABS 1.4 (H) 10/15/2021 0912   EOSABS 0.7 (H) 10/15/2021 0912   BASOSABS 0.1 10/15/2021 0912    Recent Results (from the past 240 hour(s))  Blood culture (routine x 2)     Status: None (Preliminary result)   Collection Time: 10/15/21  2:49 PM   Specimen: BLOOD  Result Value Ref Range Status   Specimen Description   Final    BLOOD LEFT ANTECUBITAL Performed at [facility], [address] [place], [place] [address]    Special Requests   Final    IN PEDIATRIC BOTTLE Blood Culture adequate volume Performed at [facility], [address], [place], [place] [address]    Culture   Final    NO GROWTH 2 DAYS Performed at [facility]  [facility], [address] [place], [place] [address]    Report Status PENDING  Incomplete  Blood culture (routine x 2)     Status: None (Preliminary result)   Collection Time: 10/15/21  4:24 PM   Specimen: BLOOD  Result Value Ref Range Status   Specimen Description   Final    BLOOD BLOOD RIGHT HAND Performed at [facility], [address] [place], [place] [address]    Special Requests   Final    BOTTLES DRAWN AEROBIC ONLY Blood Culture adequate volume Performed at [facility], [address], [place], [place] [address]    Culture    Final    NO GROWTH 2 DAYS Performed at [facility], [address], [place], [place] [address]    Report Status PENDING  Incomplete    Studies/Results: DG Chest Port 1 View  Result Date: 10/15/2021 CLINICAL DATA:  Chest pain.  History of sickle cell disease. EXAM: PORTABLE CHEST 1 VIEW COMPARISON:  May 13, 2021. FINDINGS: The heart size and mediastinal contours are within normal limits. Increased bibasilar opacities are noted concerning for atelectasis or possibly infiltrates. Diffuse sclerosis of visualized skeleton is noted consistent with history of sickle cell disease. IMPRESSION: Increased bibasilar opacities concerning for atelectasis or possibly infiltrates. Electronically Signed   By: Lupita Raider M.D.   On: 10/15/2021 11:57    Medications: Scheduled Meds:  buPROPion ER  150 mg Oral BID   enoxaparin (LOVENOX) injection  40 mg Subcutaneous Q24H   folic acid  1 mg Oral Daily   HYDROmorphone   Intravenous Q4H   ketorolac  15 mg Intravenous Q6H   morphine  45 mg Oral BID   Continuous Infusions:  sodium chloride 50 mL/hr at 10/17/21 0618   cefTRIAXone (ROCEPHIN)  IV 1 g (10/16/21 1343)   PRN Meds:.diphenhydrAMINE, naloxone **AND** sodium chloride flush, ondansetron, ondansetron (ZOFRAN) IV  Consultants: none  Procedures: none  Antibiotics: IV ceftriaxone  Assessment/Plan: Principal Problem:   Sickle cell disease with crisis (HCC) Active Problems:   Leukocytosis   History of tobacco abuse   Acute chest syndrome (HCC)  Sickle cell disease with pain crisis: Continue IV Dilaudid PCA without changes in settings MS Contin 30 mg every 12 hours Toradol 15 mg IV every 6 hours Monitor vital signs very closely, reevaluate pain scale regularly, and supplemental oxygen as needed  Acute chest syndrome: Oxygen saturation improving.  Afebrile.  No exchange transfusion warranted at this time.  We will consider discontinuing antibiotics in AM.  Incentive  spirometer  Chronic pain syndrome: Continue home medications  Tobacco dependence: Patient counseled at length on admission.  Nicotine patches.  Code Status: Full Code Family Communication: N/A Disposition Plan: Not yet ready for discharge.  Discharge planned for 10/17/2021.  Nolon Nations  APRN, MSN, FNP-C Patient Care [facility] [address] [place], [place] [address] [phone]  If 7PM-7AM, please contact night-coverage.  10/17/2021, 7:15 AM  LOS: 2 days

## 2021-10-18 DIAGNOSIS — Z87891 Personal history of nicotine dependence: Secondary | ICD-10-CM

## 2021-10-18 DIAGNOSIS — D5701 Hb-SS disease with acute chest syndrome: Secondary | ICD-10-CM

## 2021-10-18 DIAGNOSIS — D5700 Hb-SS disease with crisis, unspecified: Secondary | ICD-10-CM | POA: Diagnosis not present

## 2021-10-18 DIAGNOSIS — D72829 Elevated white blood cell count, unspecified: Secondary | ICD-10-CM

## 2021-10-18 LAB — CBC
HCT: 21.1 % — ABNORMAL LOW (ref 39.0–52.0)
Hemoglobin: 7.7 g/dL — ABNORMAL LOW (ref 13.0–17.0)
MCH: 33.2 pg (ref 26.0–34.0)
MCHC: 36.5 g/dL — ABNORMAL HIGH (ref 30.0–36.0)
MCV: 90.9 fL (ref 80.0–100.0)
Platelets: 242 10*3/uL (ref 150–400)
RBC: 2.32 MIL/uL — ABNORMAL LOW (ref 4.22–5.81)
RDW: 14.8 % (ref 11.5–15.5)
WBC: 11.7 10*3/uL — ABNORMAL HIGH (ref 4.0–10.5)
nRBC: 0.2 % (ref 0.0–0.2)

## 2021-10-20 LAB — CULTURE, BLOOD (ROUTINE X 2)
Culture: NO GROWTH
Culture: NO GROWTH
Special Requests: ADEQUATE
Special Requests: ADEQUATE

## 2022-01-02 ENCOUNTER — Other Ambulatory Visit: Payer: Self-pay | Admitting: Internal Medicine

## 2022-01-03 LAB — COMPLETE METABOLIC PANEL WITH GFR
AG Ratio: 1.5 (calc) (ref 1.0–2.5)
ALT: 11 U/L (ref 9–46)
AST: 17 U/L (ref 10–40)
Albumin: 4.5 g/dL (ref 3.6–5.1)
Alkaline phosphatase (APISO): 85 U/L (ref 36–130)
BUN: 15 mg/dL (ref 7–25)
CO2: 22 mmol/L (ref 20–32)
Calcium: 9.2 mg/dL (ref 8.6–10.3)
Chloride: 108 mmol/L (ref 98–110)
Creat: 0.93 mg/dL (ref 0.60–1.29)
Globulin: 3.1 g/dL (calc) (ref 1.9–3.7)
Glucose, Bld: 77 mg/dL (ref 65–99)
Potassium: 3.9 mmol/L (ref 3.5–5.3)
Sodium: 140 mmol/L (ref 135–146)
Total Bilirubin: 1.7 mg/dL — ABNORMAL HIGH (ref 0.2–1.2)
Total Protein: 7.6 g/dL (ref 6.1–8.1)
eGFR: 103 mL/min/{1.73_m2} (ref >=60)

## 2022-01-03 LAB — CBC
HCT: 29 % — ABNORMAL LOW (ref 38.5–50.0)
Hemoglobin: 9.9 g/dL — ABNORMAL LOW (ref 13.2–17.1)
MCH: 33.7 pg — ABNORMAL HIGH (ref 27.0–33.0)
MCHC: 34.1 g/dL (ref 32.0–36.0)
MCV: 98.6 fL (ref 80.0–100.0)
MPV: 9.8 fL (ref 7.5–12.5)
Platelets: 291 10*3/uL (ref 140–400)
RBC: 2.94 10*6/uL — ABNORMAL LOW (ref 4.20–5.80)
RDW: 14 % (ref 11.0–15.0)
WBC: 12.1 10*3/uL — ABNORMAL HIGH (ref 3.8–10.8)

## 2022-01-03 LAB — PSA: PSA: 0.72 ng/mL (ref <=4.00)

## 2022-01-03 LAB — VITAMIN D 25 HYDROXY (VIT D DEFICIENCY, FRACTURES): Vit D, 25-Hydroxy: 32 ng/mL (ref 30–100)

## 2022-01-03 LAB — LIPID PANEL
Cholesterol: 164 mg/dL (ref <200)
HDL: 40 mg/dL (ref >=40)
LDL Cholesterol (Calc): 106 mg/dL (calc) — ABNORMAL HIGH
Non-HDL Cholesterol (Calc): 124 mg/dL (calc) (ref <130)
Total CHOL/HDL Ratio: 4.1 (calc) (ref <5.0)
Triglycerides: 88 mg/dL (ref <150)

## 2022-06-27 ENCOUNTER — Emergency Department (HOSPITAL_COMMUNITY): Payer: 59

## 2022-06-27 ENCOUNTER — Inpatient Hospital Stay (HOSPITAL_COMMUNITY)
Admission: EM | Admit: 2022-06-27 | Discharge: 2022-06-29 | DRG: 091 | Disposition: A | Discharge status: Home / Self Care | Payer: 59 | Attending: Internal Medicine | Admitting: Internal Medicine

## 2022-06-27 ENCOUNTER — Other Ambulatory Visit: Payer: Self-pay

## 2022-06-27 ENCOUNTER — Encounter (HOSPITAL_COMMUNITY): Payer: Self-pay

## 2022-06-27 DIAGNOSIS — F17210 Nicotine dependence, cigarettes, uncomplicated: Secondary | ICD-10-CM | POA: Diagnosis present

## 2022-06-27 DIAGNOSIS — Z79899 Other long term (current) drug therapy: Secondary | ICD-10-CM | POA: Diagnosis not present

## 2022-06-27 DIAGNOSIS — R911 Solitary pulmonary nodule: Secondary | ICD-10-CM | POA: Diagnosis present

## 2022-06-27 DIAGNOSIS — D5701 Hb-SS disease with acute chest syndrome: Secondary | ICD-10-CM | POA: Diagnosis present

## 2022-06-27 DIAGNOSIS — Z96641 Presence of right artificial hip joint: Secondary | ICD-10-CM | POA: Diagnosis present

## 2022-06-27 DIAGNOSIS — D638 Anemia in other chronic diseases classified elsewhere: Secondary | ICD-10-CM | POA: Diagnosis present

## 2022-06-27 DIAGNOSIS — G894 Chronic pain syndrome: Secondary | ICD-10-CM | POA: Diagnosis present

## 2022-06-27 DIAGNOSIS — D5700 Hb-SS disease with crisis, unspecified: Secondary | ICD-10-CM | POA: Diagnosis present

## 2022-06-27 DIAGNOSIS — J189 Pneumonia, unspecified organism: Secondary | ICD-10-CM | POA: Diagnosis not present

## 2022-06-27 DIAGNOSIS — D72829 Elevated white blood cell count, unspecified: Secondary | ICD-10-CM | POA: Diagnosis present

## 2022-06-27 LAB — LIPASE, BLOOD: Lipase: 33 U/L (ref 11–51)

## 2022-06-27 LAB — COMPREHENSIVE METABOLIC PANEL
ALT: 13 U/L (ref 0–44)
AST: 24 U/L (ref 15–41)
Albumin: 4.5 g/dL (ref 3.5–5.0)
Alkaline Phosphatase: 87 U/L (ref 38–126)
Anion gap: 11 (ref 5–15)
BUN: 17 mg/dL (ref 6–20)
CO2: 24 mmol/L (ref 22–32)
Calcium: 10 mg/dL (ref 8.9–10.3)
Chloride: 104 mmol/L (ref 98–111)
Creatinine, Ser: 1.02 mg/dL (ref 0.61–1.24)
GFR, Estimated: >60 mL/min (ref >60)
Glucose, Bld: 98 mg/dL (ref 70–99)
Potassium: 4.2 mmol/L (ref 3.5–5.1)
Sodium: 139 mmol/L (ref 135–145)
Total Bilirubin: 1.4 mg/dL — ABNORMAL HIGH (ref 0.3–1.2)
Total Protein: 9 g/dL — ABNORMAL HIGH (ref 6.5–8.1)

## 2022-06-27 LAB — RETICULOCYTES
Immature Retic Fract: 21 % — ABNORMAL HIGH (ref 2.3–15.9)
RBC.: 2.98 MIL/uL — ABNORMAL LOW (ref 4.22–5.81)
Retic Count, Absolute: 48.9 10*3/uL (ref 19.0–186.0)
Retic Ct Pct: 1.6 % (ref 0.4–3.1)

## 2022-06-27 LAB — CBC WITH DIFFERENTIAL/PLATELET
Abs Immature Granulocytes: 0 10*3/uL (ref 0.00–0.07)
Band Neutrophils: 0 %
Basophils Absolute: 0.2 10*3/uL — ABNORMAL HIGH (ref 0.0–0.1)
Basophils Relative: 1 %
Blasts: 0 %
Eosinophils Absolute: 0.8 10*3/uL — ABNORMAL HIGH (ref 0.0–0.5)
Eosinophils Relative: 5 %
HCT: 27.7 % — ABNORMAL LOW (ref 39.0–52.0)
Hemoglobin: 10 g/dL — ABNORMAL LOW (ref 13.0–17.0)
Lymphocytes Relative: 16 %
Lymphs Abs: 2.5 10*3/uL (ref 0.7–4.0)
MCH: 33.2 pg (ref 26.0–34.0)
MCHC: 36.1 g/dL — ABNORMAL HIGH (ref 30.0–36.0)
MCV: 92 fL (ref 80.0–100.0)
Metamyelocytes Relative: 0 %
Monocytes Absolute: 1.4 10*3/uL — ABNORMAL HIGH (ref 0.1–1.0)
Monocytes Relative: 9 %
Myelocytes: 0 %
Neutro Abs: 10.6 10*3/uL — ABNORMAL HIGH (ref 1.7–7.7)
Neutrophils Relative %: 69 %
Other: 0 %
Platelets: 362 10*3/uL (ref 150–400)
Promyelocytes Relative: 0 %
RBC: 3.01 MIL/uL — ABNORMAL LOW (ref 4.22–5.81)
RDW: 13.2 % (ref 11.5–15.5)
WBC: 15.5 10*3/uL — ABNORMAL HIGH (ref 4.0–10.5)
nRBC: 0 /100 WBC
nRBC: 0.2 % (ref 0.0–0.2)

## 2022-06-27 LAB — LACTATE DEHYDROGENASE: LDH: 156 U/L (ref 98–192)

## 2022-06-27 LAB — TROPONIN I (HIGH SENSITIVITY)
Troponin I (High Sensitivity): <2 ng/L (ref <18)
Troponin I (High Sensitivity): <2 ng/L (ref <18)

## 2022-06-27 LAB — LACTIC ACID, PLASMA: Lactic Acid, Venous: 0.8 mmol/L (ref 0.5–1.9)

## 2022-06-27 MED ORDER — HYDROMORPHONE HCL 2 MG/ML IJ SOLN
2.0000 mg | INTRAMUSCULAR | Status: AC
  Administered 2022-06-27: 2 mg via INTRAVENOUS
  Filled 2022-06-27: qty 1

## 2022-06-27 MED ORDER — DIPHENHYDRAMINE HCL 25 MG PO CAPS: 25.0000 mg | ORAL_CAPSULE | ORAL | Status: DC | PRN

## 2022-06-27 MED ORDER — HYDROMORPHONE HCL 1 MG/ML IJ SOLN: 0.5000 mg | INTRAMUSCULAR | Status: DC

## 2022-06-27 MED ORDER — SODIUM CHLORIDE (PF) 0.9 % IJ SOLN
INTRAMUSCULAR | Status: AC
  Filled 2022-06-27: qty 50

## 2022-06-27 MED ORDER — MORPHINE SULFATE 30 MG PO TABS
30.0000 mg | ORAL_TABLET | Freq: Every day | ORAL | Status: DC | PRN
  Administered 2022-06-29: 30 mg via ORAL
  Filled 2022-06-27: qty 1

## 2022-06-27 MED ORDER — ALBUTEROL SULFATE HFA 108 (90 BASE) MCG/ACT IN AERS
2.0000 | INHALATION_SPRAY | RESPIRATORY_TRACT | Status: DC | PRN
Start: 2022-06-27 — End: 2022-06-27

## 2022-06-27 MED ORDER — SODIUM CHLORIDE 0.9 % IV SOLN
12.5000 mg | Freq: Once | INTRAVENOUS | Status: AC
  Administered 2022-06-27: 12.5 mg via INTRAVENOUS
  Filled 2022-06-27: qty 12.5

## 2022-06-27 MED ORDER — SENNOSIDES-DOCUSATE SODIUM 8.6-50 MG PO TABS
1.0000 | ORAL_TABLET | Freq: Two times a day (BID) | ORAL | Status: DC
  Administered 2022-06-27 – 2022-06-29 (×5): 1 via ORAL
  Filled 2022-06-27 (×5): qty 1

## 2022-06-27 MED ORDER — ENOXAPARIN SODIUM 40 MG/0.4ML IJ SOSY
40.0000 mg | PREFILLED_SYRINGE | Freq: Every day | INTRAMUSCULAR | Status: DC
  Administered 2022-06-27 – 2022-06-29 (×3): 40 mg via SUBCUTANEOUS
  Filled 2022-06-27 (×3): qty 0.4

## 2022-06-27 MED ORDER — SODIUM CHLORIDE 0.45 % IV SOLN: INTRAVENOUS | Status: DC

## 2022-06-27 MED ORDER — ONDANSETRON HCL 4 MG/2ML IJ SOLN
4.0000 mg | Freq: Four times a day (QID) | INTRAMUSCULAR | Status: DC | PRN
  Administered 2022-06-28 – 2022-06-29 (×3): 4 mg via INTRAVENOUS
  Filled 2022-06-27 (×3): qty 2

## 2022-06-27 MED ORDER — MORPHINE SULFATE ER 15 MG PO TBCR
30.0000 mg | EXTENDED_RELEASE_TABLET | Freq: Two times a day (BID) | ORAL | Status: DC
  Administered 2022-06-27 – 2022-06-29 (×5): 30 mg via ORAL
  Filled 2022-06-27 (×5): qty 2

## 2022-06-27 MED ORDER — SODIUM CHLORIDE 0.9 % IV SOLN
1.0000 g | Freq: Every day | INTRAVENOUS | Status: DC
  Administered 2022-06-28 – 2022-06-29 (×2): 1 g via INTRAVENOUS
  Filled 2022-06-27 (×2): qty 10

## 2022-06-27 MED ORDER — HYDROMORPHONE HCL 2 MG/ML IJ SOLN
2.0000 mg | Freq: Once | INTRAMUSCULAR | Status: AC
  Administered 2022-06-27: 2 mg via INTRAVENOUS
  Filled 2022-06-27: qty 1

## 2022-06-27 MED ORDER — HYDROMORPHONE 1 MG/ML IV SOLN
INTRAVENOUS | Status: DC
  Administered 2022-06-27: 5 mg via INTRAVENOUS
  Administered 2022-06-27: 30 mg via INTRAVENOUS
  Administered 2022-06-27: 7 mg via INTRAVENOUS
  Administered 2022-06-27: 6 mg via INTRAVENOUS
  Administered 2022-06-28: 30 mg via INTRAVENOUS
  Administered 2022-06-28: 5.2 mg via INTRAVENOUS
  Administered 2022-06-28: 7.5 mg via INTRAVENOUS
  Administered 2022-06-28: 3.5 mg via INTRAVENOUS
  Administered 2022-06-28: 4 mg via INTRAVENOUS
  Administered 2022-06-29: 30 mg via INTRAVENOUS
  Administered 2022-06-29: 6.5 mg via INTRAVENOUS
  Administered 2022-06-29: 5.7 mg via INTRAVENOUS
  Filled 2022-06-27 (×3): qty 30

## 2022-06-27 MED ORDER — DOXYCYCLINE HYCLATE 100 MG PO TABS
100.0000 mg | ORAL_TABLET | Freq: Once | ORAL | Status: AC
  Administered 2022-06-27: 100 mg via ORAL
  Filled 2022-06-27: qty 1

## 2022-06-27 MED ORDER — ONDANSETRON HCL 4 MG/2ML IJ SOLN
4.0000 mg | INTRAMUSCULAR | Status: DC | PRN
  Administered 2022-06-27: 4 mg via INTRAVENOUS
  Filled 2022-06-27: qty 2

## 2022-06-27 MED ORDER — TIZANIDINE HCL 4 MG PO TABS: 4.0000 mg | ORAL_TABLET | Freq: Every day | ORAL | Status: DC | PRN

## 2022-06-27 MED ORDER — BUPROPION HCL ER (SMOKING DET) 150 MG PO TB12
150.0000 mg | ORAL_TABLET | Freq: Two times a day (BID) | ORAL | Status: DC
  Filled 2022-06-27: qty 1

## 2022-06-27 MED ORDER — IOHEXOL 350 MG/ML SOLN
75.0000 mL | Freq: Once | INTRAVENOUS | Status: AC | PRN
  Administered 2022-06-27: 75 mL via INTRAVENOUS

## 2022-06-27 MED ORDER — BUPROPION HCL ER (SR) 150 MG PO TB12
150.0000 mg | ORAL_TABLET | Freq: Two times a day (BID) | ORAL | Status: DC
  Administered 2022-06-27 – 2022-06-29 (×5): 150 mg via ORAL
  Filled 2022-06-27 (×6): qty 1

## 2022-06-27 MED ORDER — NALOXONE HCL 0.4 MG/ML IJ SOLN: 0.4000 mg | INTRAMUSCULAR | Status: DC | PRN

## 2022-06-27 MED ORDER — POLYETHYLENE GLYCOL 3350 17 G PO PACK: 17.0000 g | PACK | Freq: Every day | ORAL | Status: DC | PRN

## 2022-06-27 MED ORDER — KETOROLAC TROMETHAMINE 15 MG/ML IJ SOLN
15.0000 mg | Freq: Four times a day (QID) | INTRAMUSCULAR | Status: DC
  Administered 2022-06-27 – 2022-06-29 (×9): 15 mg via INTRAVENOUS
  Filled 2022-06-27 (×9): qty 1

## 2022-06-27 MED ORDER — SODIUM CHLORIDE 0.9 % IV SOLN
1.0000 g | Freq: Once | INTRAVENOUS | Status: AC
  Administered 2022-06-27: 1 g via INTRAVENOUS
  Filled 2022-06-27: qty 10

## 2022-06-27 MED ORDER — SODIUM CHLORIDE 0.9% FLUSH: 9.0000 mL | INTRAVENOUS | Status: DC | PRN

## 2022-06-27 MED ORDER — AZITHROMYCIN 250 MG PO TABS
500.0000 mg | ORAL_TABLET | Freq: Every day | ORAL | Status: DC
  Administered 2022-06-27 – 2022-06-29 (×3): 500 mg via ORAL
  Filled 2022-06-27 (×4): qty 2

## 2022-06-27 NOTE — ED Triage Notes (Signed)
Pt reports CP and SHOB since earlier tonight. Pain radiates around L side to back. Pt states that his sickle cell crisis feels like this.

## 2022-06-27 NOTE — ED Provider Notes (Signed)
Awaiting possible call from Dr. Doreene Burke, if not available admit to [facility]. Physical Exam  BP 117/71   Pulse 70   Temp 98.1 F (36.7 C)   Resp 12   Ht '6\' 2"'$  (1.88 m)   Wt 81.6 kg   SpO2 98%   BMI 23.11 kg/m   Physical Exam  Procedures  Procedures  ED Course / MDM    Medical Decision Making Amount and/or Complexity of Data Reviewed Labs: ordered. Radiology: ordered.  Risk Prescription drug management. Decision regarding hospitalization.          Charlesetta Shanks, MD 06/27/22 0800

## 2022-06-27 NOTE — ED Provider Notes (Signed)
[place] EMERGENCY DEPARTMENT AT [facility] Provider Note   CSN: [id number] Arrival date & time: 06/27/22  [id number]     History  Chief Complaint  Patient presents with   Shortness of Breath   Sickle Cell Pain Crisis    BLAKELEE FULCO is a 47 y.o. male.  The history is provided by the patient and medical records.  Shortness of Breath Sickle Cell Pain Crisis Associated symptoms: shortness of breath   KENSEI DESRUISSEAUX is a 47 y.o. male who presents to the Emergency Department complaining of chest pain.  He presents to the emergency department for evaluation of sharp, throbbing chest pain that is worse with breathing.  He had mild symptoms that started on Monday and they have been progressively worsening and became severe tonight.  No fever.  He has an occasional cough that is productive of clear mucus.  No abdominal pain, nausea, vomiting, leg swelling or pain.  He has a history of sickle cell anemia.  No prior similar pain.  No history of DVT/PE.    Home Medications Prior to Admission medications   Medication Sig Start Date End Date Taking? Authorizing Provider  buPROPion (ZYBAN) 150 MG 12 hr tablet Take 150 mg by mouth 2 (two) times daily. 05/04/21  Yes [provider]  diphenhydrAMINE (BENADRYL) 25 MG tablet Take 25 mg by mouth every 6 (six) hours as needed for itching.   Yes [provider]  ibuprofen (ADVIL) 200 MG tablet Take 200 mg by mouth every 6 (six) hours as needed for moderate pain.   Yes [provider]  morphine (MS CONTIN) 15 MG 12 hr tablet Take 30 mg by mouth 2 (two) times daily. 09/11/20  Yes [provider]  morphine (MSIR) 30 MG tablet Take 1 tablet (30 mg total) by mouth daily as needed for severe pain (pain). Resume after completing dilaudid. Patient taking differently: Take 30 mg by mouth 2 (two) times daily as needed for severe pain (pain). 02/17/16  Yes Leana Gamer, MD  NARCAN 4 MG/0.1ML LIQD nasal spray kit  Place 4 mg into the nose once as needed (overdose). 12/16/17  Yes [provider]  promethazine (PHENERGAN) 25 MG tablet Take 25 mg by mouth every 6 (six) hours as needed for nausea.   Yes [provider]  tiZANidine (ZANAFLEX) 4 MG tablet Take 4 mg by mouth daily as needed for muscle spasms. 09/05/20  Yes [provider]  ibuprofen (ADVIL,MOTRIN) 800 MG tablet Take 800 mg by mouth every 8 (eight) hours as needed for moderate pain.  Patient not taking: Reported on 06/27/2022 09/07/16   [provider]      Allergies    Patient has no known allergies.    Review of Systems   Review of Systems  Respiratory:  Positive for shortness of breath.   All other systems reviewed and are negative.   Physical Exam Updated Vital Signs BP 117/71   Pulse 70   Temp 98.1 F (36.7 C)   Resp 12   Ht '6\' 2"'$  (1.88 m)   Wt 81.6 kg   SpO2 98%   BMI 23.11 kg/m  Physical Exam Vitals and nursing note reviewed.  Constitutional:      Appearance: He is well-developed.     Comments: Appears uncomfortable  HENT:     Head: Normocephalic and atraumatic.  Cardiovascular:     Rate and Rhythm: Regular rhythm. Tachycardia present.     Heart sounds: No murmur heard.  Pulmonary:     Effort: Pulmonary effort is normal. No respiratory distress.     Breath sounds: Normal breath sounds.  Abdominal:     Palpations: Abdomen is soft.     Tenderness: There is no abdominal tenderness. There is no guarding or rebound.  Musculoskeletal:        General: No swelling or tenderness.  Skin:    General: Skin is warm and dry.  Neurological:     Mental Status: He is alert and oriented to person, place, and time.  Psychiatric:        Behavior: Behavior normal.     ED Results / Procedures / Treatments   Labs (all labs ordered are listed, but only abnormal results are displayed) Labs Reviewed  COMPREHENSIVE METABOLIC PANEL - Abnormal; Notable for the following components:      Result Value    Total Protein 9.0 (*)    Total Bilirubin 1.4 (*)    All other components within normal limits  CBC WITH DIFFERENTIAL/PLATELET - Abnormal; Notable for the following components:   WBC 15.5 (*)    RBC 3.01 (*)    Hemoglobin 10.0 (*)    HCT 27.7 (*)    MCHC 36.1 (*)    Neutro Abs 10.6 (*)    Monocytes Absolute 1.4 (*)    Eosinophils Absolute 0.8 (*)    Basophils Absolute 0.2 (*)    All other components within normal limits  RETICULOCYTES - Abnormal; Notable for the following components:   RBC. 2.98 (*)    Immature Retic Fract 21.0 (*)    All other components within normal limits  CULTURE, BLOOD (ROUTINE X 2)  CULTURE, BLOOD (ROUTINE X 2)  RESP PANEL BY RT-PCR (RSV, FLU A&B, COVID)  RVPGX2  LIPASE, BLOOD  LACTIC ACID, PLASMA  LACTIC ACID, PLASMA  TROPONIN I (HIGH SENSITIVITY)  TROPONIN I (HIGH SENSITIVITY)    EKG EKG Interpretation  Date/Time:  Saturday June 27 2022 02:11:05 EST Ventricular Rate:  100 PR Interval:  146 QRS Duration: 83 QT Interval:  331 QTC Calculation: 427 R Axis:   1 Text Interpretation: Sinus tachycardia Borderline T abnormalities, diffuse leads Confirmed by Quintella Reichert [phone]) on 06/27/2022 2:13:11 AM  Radiology CT Angio Chest PE W/Cm &/Or Wo Cm  Result Date: 06/27/2022 CLINICAL DATA:  Sickle cell pain crisis. Pulmonary embolism suspected. EXAM: CT ANGIOGRAPHY CHEST WITH CONTRAST TECHNIQUE: Multidetector CT imaging of the chest was performed using the standard protocol during bolus administration of intravenous contrast. Multiplanar CT image reconstructions and MIPs were obtained to evaluate the vascular anatomy. RADIATION DOSE REDUCTION: This exam was performed according to the departmental dose-optimization program which includes automated exposure control, adjustment of the mA and/or kV according to patient size and/or use of iterative reconstruction technique. CONTRAST:  67m OMNIPAQUE IOHEXOL 350 MG/ML SOLN COMPARISON:  None Available. FINDINGS:  Cardiovascular: The heart size is normal. No substantial pericardial effusion. No thoracic aortic aneurysm. No substantial atherosclerosis of the thoracic aorta. There is no filling defect within the opacified pulmonary arteries to suggest the presence of an acute pulmonary embolus. Mediastinum/Nodes: Upper normal to mild mediastinal lymphadenopathy evident. 10 mm short axis prevascular node on 117/5. 11 mm short axis precarinal node on 01/20 1/5. 13 mm short axis left hilar node on 157/5. The esophagus has normal imaging features. There is no axillary lymphadenopathy. Lungs/Pleura: Centrilobular and paraseptal emphysema evident. 2.8 cm peripheral left upper lobe nodular consolidative opacity noted on 86/6. There is bilateral lower lung collapse/consolidation, progressive in the  interval. 3.0 cm nodular consolidative opacity seen anterior right lower lobe adjacent to the hemidiaphragm on 96/6. No substantial pleural effusion. Upper Abdomen: Asymmetric elevation right hemidiaphragm with prominent stool volume noted in the visualized abdominal segments of the colon. Musculoskeletal: Diffuse bony changes compatible with reported clinical history of sickle cell disease. Review of the MIP images confirms the above findings. IMPRESSION: 1. No CT evidence for acute pulmonary embolus. 2. Bilateral lower lung collapse/consolidation, progressive in the interval. 3. 2.8 cm peripheral left upper lobe nodular consolidative opacity with 3.0 cm nodular consolidative opacity anterior right lower lobe adjacent to the hemidiaphragm. These are likely infectious/inflammatory, compatible with multifocal pneumonia. Septic emboli could have this appearance. 4. Upper normal to mild mediastinal and left hilar lymphadenopathy, likely reactive. Follow-up CT chest with contrast in 3 months recommended to ensure resolution. 5. Diffuse bony changes compatible with reported clinical history of sickle cell disease. 6. Asymmetric elevation right  hemidiaphragm. 7. Prominent stool volume in the visualized abdominal segments of the colon. Imaging features could be compatible with constipation in the appropriate clinical setting. 8.  Emphysema (ICD10-J43.9). Electronically Signed   By: Misty Stanley M.D.   On: 06/27/2022 06:51   DG Chest Port 1 View  Result Date: 06/27/2022 CLINICAL DATA:  Sickle cell crisis EXAM: PORTABLE CHEST 1 VIEW COMPARISON:  10/15/2021 FINDINGS: Cardiac shadow is stable. Persistent scarring is noted in the bases bilaterally stable from the prior exam. No new focal infiltrate is seen. No bony abnormality is noted. IMPRESSION: Stable scarring in the bases bilaterally. Electronically Signed   By: Inez Catalina M.D.   On: 06/27/2022 02:51    Procedures Procedures   CRITICAL CARE Performed by: Quintella Reichert   Total critical care time: 40 minutes  Critical care time was exclusive of separately billable procedures and treating other patients.  Critical care was necessary to treat or prevent imminent or life-threatening deterioration.  Critical care was time spent personally by me on the following activities: development of treatment plan with patient and/or surrogate as well as nursing, discussions with consultants, evaluation of patient's response to treatment, examination of patient, obtaining history from patient or surrogate, ordering and performing treatments and interventions, ordering and review of laboratory studies, ordering and review of radiographic studies, pulse oximetry and re-evaluation of patient's condition.  Medications Ordered in ED Medications  albuterol (VENTOLIN HFA) 108 (90 Base) MCG/ACT inhaler 2 puff (has no administration in time range)  0.45 % sodium chloride infusion ( Intravenous New Bag/Given 06/27/22 0237)  ondansetron (ZOFRAN) injection 4 mg (4 mg Intravenous Given 06/27/22 0234)  sodium chloride (PF) 0.9 % injection (has no administration in time range)  cefTRIAXone (ROCEPHIN) 1 g in sodium  chloride 0.9 % 100 mL IVPB (1 g Intravenous New Bag/Given 06/27/22 0722)  HYDROmorphone (DILAUDID) injection 2 mg (has no administration in time range)  diphenhydrAMINE (BENADRYL) 12.5 mg in sodium chloride 0.9 % 50 mL IVPB (0 mg Intravenous Stopped 06/27/22 0418)  HYDROmorphone (DILAUDID) injection 2 mg (2 mg Intravenous Given 06/27/22 0234)  HYDROmorphone (DILAUDID) injection 2 mg (2 mg Intravenous Given 06/27/22 0302)  HYDROmorphone (DILAUDID) injection 2 mg (2 mg Intravenous Given 06/27/22 0354)  HYDROmorphone (DILAUDID) injection 2 mg (2 mg Intravenous Given 06/27/22 0436)  iohexol (OMNIPAQUE) 350 MG/ML injection 75 mL (75 mLs Intravenous Contrast Given 06/27/22 0453)  doxycycline (VIBRA-TABS) tablet 100 mg (100 mg Oral Given 06/27/22 [id number])    ED Course/ Medical Decision Making/ A&P  Medical Decision Making Amount and/or Complexity of Data Reviewed Labs: ordered. Radiology: ordered.  Risk Prescription drug management. Decision regarding hospitalization.  Patient with history of sickle cell here for evaluation of chest pain that started on Monday.  Patient ill-appearing on evaluation with tachycardia, new oxygen requirement and appears uncomfortable.  He was treated with medications for pain control as well as IV fluid hydration.  Chest x-ray is negative for acute abnormality-images personally reviewed and interpreted..  Given degree of pain and patient symptoms concern for PE and a CTA was obtained.  CTA is concerning for multifocal pneumonia.  Will start on antibiotics.  Troponins are negative x 2.  His hemoglobin is near his baseline.  Given multifocal pneumonia, uncontrolled pain, concern for acute chest plan to admit for ongoing care.  Discussed with patient findings of studies and recommendation for admission and he is in agreement with treatment plan.  Hospitalist consulted for admission.        Final Clinical Impression(s) / ED Diagnoses Final diagnoses:   Sickle cell crisis [facility])  Multifocal pneumonia    Rx / DC Orders ED Discharge Orders     None         Quintella Reichert, MD 06/27/22 [phone]

## 2022-06-27 NOTE — ED Notes (Signed)
Patient transported to CT 

## 2022-06-27 NOTE — H&P (Signed)
H&P  Patient Demographics:  [name], is a 47 y.o. male  MRN: [id number]   DOB - [birth year]  Admit Date - 06/27/2022  Outpatient Primary MD for the patient is Nolene Ebbs, MD  Chief Complaint  Patient presents with   Shortness of Breath   Sickle Cell Pain Crisis      HPI:   [name]  is a 47 y.o. male with a medical history of sickle cell disease, chronic pain syndrome, opiate dependence and tolerance, anemia of chronic disease and tobacco dependence who came to the emergency room today with major complaints of throbbing chest pain, lower extremity pains, mild cough and some shortness of breath.  Chest pain is worse on deep breathing.  He took his home pain medications with no sustained relief.  His symptoms started on Monday and has gradually worsened until yesterday when he could not take his normal.  He had no fever.  He has occasional cough is productive of clear sputum.  He denies any abdominal pain, nausea, vomiting or diarrhea.  No urinary symptoms.  No joint swelling or redness.  ED course: Vital signs in the emergency room showed: BP 117/71  Pulse 70  Temp 98.1 F (36.7 C)  Resp 12  Ht '6\' 2"'$  (1.88 m)  Wt 81.6 kg  SpO2 98%  BMI 23.11 kg/m.  Comprehensive metabolic panel is unremarkable. Hemoglobin is stable at baseline of 10. Patient ruled out coronary artery disease. Lipase normal. Lactic acid level normal. LDH level 156. CT angiogram showed bilateral lower lung collapse/consolidation, progressive in the interval with 2.8 cm peripheral left upper lobe pulm nodule consolidative opacity, likely infectious or inflammatory compatible with multifocal pneumonia. This is concerning for acute chest syndrome versus multifocal pneumonia. Patient will be admitted to the hospital for sickle cell pain crisis and possible acute chest syndrome.   Review of systems:  In addition to the HPI above, patient reports No fever or chills No Headache, No changes with vision or hearing No  problems swallowing food or liquids No abdominal pain, No nausea or vomiting, Bowel movements are regular No blood in stool or urine No dysuria No new skin rashes or bruises No new joints pains-aches No new weakness, tingling, numbness in any extremity No recent weight gain or loss No polyuria, polydypsia or polyphagia No significant Mental Stressors  A full 10 point Review of Systems was done, except as stated above, all other Review of Systems were negative.  With Past History of the following :   Past Medical History:  Diagnosis Date   Avascular necrosis of hip ([place])    bilateral   Avascular necrosis of hip, left ([place]) 08/27/2011   Blood transfusion    Infection of bone, shoulder region [facility])    left shoulder   Pneumonia    Sickle cell crisis [facility])       Past Surgical History:  Procedure Laterality Date   BONE GRAFT HIP ILIAC CREST     JOINT REPLACEMENT  2006   right total hip arthroplasty   Orif right hip fracture  1995     Social History:   Social History   Tobacco Use   Smoking status: Some Days    Packs/day: 0.50    Types: Cigarettes    Last attempt to quit: 01/27/2012    Years since quitting: 10.4   Smokeless tobacco: Never  Substance Use Topics   Alcohol use: No     Lives - At home   Family History :  Family History  Adopted: Yes     Home Medications:   Prior to Admission medications   Medication Sig Start Date End Date Taking? Authorizing Provider  buPROPion (ZYBAN) 150 MG 12 hr tablet Take 150 mg by mouth 2 (two) times daily. 05/04/21  Yes [provider]  diphenhydrAMINE (BENADRYL) 25 MG tablet Take 25 mg by mouth every 6 (six) hours as needed for itching.   Yes [provider]  ibuprofen (ADVIL) 200 MG tablet Take 200 mg by mouth every 6 (six) hours as needed for moderate pain.   Yes [provider]  morphine (MS CONTIN) 15 MG 12 hr tablet Take 30 mg by mouth 2 (two) times daily. 09/11/20  Yes [provider]   morphine (MSIR) 30 MG tablet Take 1 tablet (30 mg total) by mouth daily as needed for severe pain (pain). Resume after completing dilaudid. Patient taking differently: Take 30 mg by mouth 2 (two) times daily as needed for severe pain (pain). 02/17/16  Yes Leana Gamer, MD  NARCAN 4 MG/0.1ML LIQD nasal spray kit Place 4 mg into the nose once as needed (overdose). 12/16/17  Yes [provider]  promethazine (PHENERGAN) 25 MG tablet Take 25 mg by mouth every 6 (six) hours as needed for nausea.   Yes [provider]  tiZANidine (ZANAFLEX) 4 MG tablet Take 4 mg by mouth daily as needed for muscle spasms. 09/05/20  Yes [provider]  ibuprofen (ADVIL,MOTRIN) 800 MG tablet Take 800 mg by mouth every 8 (eight) hours as needed for moderate pain.  Patient not taking: Reported on 06/27/2022 09/07/16   [provider]     Allergies:   No Known Allergies   Physical Exam:   Vitals:   Vitals:   06/27/22 0930 06/27/22 1000  BP: 98/65 102/64  Pulse: (!) 59 64  Resp: 13 13  Temp:    SpO2: 100% 99%    Physical Exam: Constitutional: Patient appears well-developed and well-nourished. Not in obvious distress. HENT: Normocephalic, atraumatic, External right and left ear normal. Oropharynx is clear and moist.  Eyes: Conjunctivae and EOM are normal. PERRLA, no scleral icterus. Neck: Normal ROM. Neck supple. No JVD. No tracheal deviation. No thyromegaly. CVS: RRR, S1/S2 +, no murmurs, no gallops, no carotid bruit.  Pulmonary: Effort and breath sounds normal, no stridor, rhonchi, wheezes, rales.  Abdominal: Soft. BS +, no distension, tenderness, rebound or guarding.  Musculoskeletal: Normal range of motion. No edema and no tenderness.  Lymphadenopathy: No lymphadenopathy noted, cervical, inguinal or axillary Neuro: Alert. Normal reflexes, muscle tone coordination. No cranial nerve deficit. Skin: Skin is warm and dry. No rash noted. Not diaphoretic. No erythema. No  pallor. Psychiatric: Normal mood and affect. Behavior, judgment, thought content normal.   Data Review:   CBC Recent Labs  Lab 06/27/22 0220  WBC 15.5*  HGB 10.0*  HCT 27.7*  PLT 362  MCV 92.0  MCH 33.2  MCHC 36.1*  RDW 13.2  LYMPHSABS 2.5  MONOABS 1.4*  EOSABS 0.8*  BASOSABS 0.2*   ------------------------------------------------------------------------------------------------------------------  Chemistries  Recent Labs  Lab 06/27/22 0220  NA 139  K 4.2  CL 104  CO2 24  GLUCOSE 98  BUN 17  CREATININE 1.02  CALCIUM 10.0  AST 24  ALT 13  ALKPHOS 87  BILITOT 1.4*   ------------------------------------------------------------------------------------------------------------------ estimated creatinine clearance is 103.3 mL/min (by C-G formula based on SCr of 1.02 mg/dL). ------------------------------------------------------------------------------------------------------------------ No results for input(s): "TSH", "T4TOTAL", "T3FREE", "THYROIDAB" in the last 72  hours.  Invalid input(s): "FREET3"  Coagulation profile No results for input(s): "INR", "PROTIME" in the last 168 hours. ------------------------------------------------------------------------------------------------------------------- No results for input(s): "DDIMER" in the last 72 hours. -------------------------------------------------------------------------------------------------------------------  Cardiac Enzymes No results for input(s): "CKMB", "TROPONINI", "MYOGLOBIN" in the last 168 hours.  Invalid input(s): "CK" ------------------------------------------------------------------------------------------------------------------ No results found for: "BNP"  ---------------------------------------------------------------------------------------------------------------  Urinalysis    Component Value Date/Time   COLORURINE YELLOW 12/28/2018 [place] 12/28/2018 1831    LABSPEC >1.046 (H) 12/28/2018 1831   PHURINE 5.0 12/28/2018 1831   GLUCOSEU NEGATIVE 12/28/2018 1831   HGBUR NEGATIVE 12/28/2018 1831   [place] 12/28/2018 1831   KETONESUR NEGATIVE 12/28/2018 1831   PROTEINUR NEGATIVE 12/28/2018 1831   UROBILINOGEN 0.2 10/25/2013 0020   NITRITE NEGATIVE 12/28/2018 1831   LEUKOCYTESUR NEGATIVE 12/28/2018 1831    ----------------------------------------------------------------------------------------------------------------   Imaging Results:    CT Angio Chest PE W/Cm &/Or Wo Cm  Result Date: 06/27/2022 CLINICAL DATA:  Sickle cell pain crisis. Pulmonary embolism suspected. EXAM: CT ANGIOGRAPHY CHEST WITH CONTRAST TECHNIQUE: Multidetector CT imaging of the chest was performed using the standard protocol during bolus administration of intravenous contrast. Multiplanar CT image reconstructions and MIPs were obtained to evaluate the vascular anatomy. RADIATION DOSE REDUCTION: This exam was performed according to the departmental dose-optimization program which includes automated exposure control, adjustment of the mA and/or kV according to patient size and/or use of iterative reconstruction technique. CONTRAST:  48m OMNIPAQUE IOHEXOL 350 MG/ML SOLN COMPARISON:  None Available. FINDINGS: Cardiovascular: The heart size is normal. No substantial pericardial effusion. No thoracic aortic aneurysm. No substantial atherosclerosis of the thoracic aorta. There is no filling defect within the opacified pulmonary arteries to suggest the presence of an acute pulmonary embolus. Mediastinum/Nodes: Upper normal to mild mediastinal lymphadenopathy evident. 10 mm short axis prevascular node on 117/5. 11 mm short axis precarinal node on 01/20 1/5. 13 mm short axis left hilar node on 157/5. The esophagus has normal imaging features. There is no axillary lymphadenopathy. Lungs/Pleura: Centrilobular and paraseptal emphysema evident. 2.8 cm peripheral left upper lobe nodular  consolidative opacity noted on 86/6. There is bilateral lower lung collapse/consolidation, progressive in the interval. 3.0 cm nodular consolidative opacity seen anterior right lower lobe adjacent to the hemidiaphragm on 96/6. No substantial pleural effusion. Upper Abdomen: Asymmetric elevation right hemidiaphragm with prominent stool volume noted in the visualized abdominal segments of the colon. Musculoskeletal: Diffuse bony changes compatible with reported clinical history of sickle cell disease. Review of the MIP images confirms the above findings. IMPRESSION: 1. No CT evidence for acute pulmonary embolus. 2. Bilateral lower lung collapse/consolidation, progressive in the interval. 3. 2.8 cm peripheral left upper lobe nodular consolidative opacity with 3.0 cm nodular consolidative opacity anterior right lower lobe adjacent to the hemidiaphragm. These are likely infectious/inflammatory, compatible with multifocal pneumonia. Septic emboli could have this appearance. 4. Upper normal to mild mediastinal and left hilar lymphadenopathy, likely reactive. Follow-up CT chest with contrast in 3 months recommended to ensure resolution. 5. Diffuse bony changes compatible with reported clinical history of sickle cell disease. 6. Asymmetric elevation right hemidiaphragm. 7. Prominent stool volume in the visualized abdominal segments of the colon. Imaging features could be compatible with constipation in the appropriate clinical setting. 8.  Emphysema (ICD10-J43.9). Electronically Signed   By: EMisty StanleyM.D.   On: 06/27/2022 06:51   DG Chest Port 1 View  Result Date: 06/27/2022 CLINICAL DATA:  Sickle cell crisis EXAM: PORTABLE CHEST 1 VIEW COMPARISON:  10/15/2021 FINDINGS: Cardiac  shadow is stable. Persistent scarring is noted in the bases bilaterally stable from the prior exam. No new focal infiltrate is seen. No bony abnormality is noted. IMPRESSION: Stable scarring in the bases bilaterally. Electronically Signed    By: Inez Catalina M.D.   On: 06/27/2022 02:51     Assessment & Plan:  Active Problems:   Anemia of chronic disease   Leukocytosis   Multifocal pneumonia   Sickle cell crisis acute chest syndrome (HCC)   Chronic pain syndrome   Acute chest syndrome (HCC)  Acute chest syndrome versus multifocal pneumonia: Start IV antibiotics ceftriaxone azithromycin.  IV fluid.  Incentive spirometry.  Cough expectorant.  Supplemental oxygen.  Monitor very closely. Hb Sickle Cell Disease with crisis: Admit patient, start IVF 0.45% Saline @ 100 mls/hour, start weight based Dilaudid PCA, start IV Toradol 15 mg Q 6 H, Restart oral home pain medications, Monitor vitals very closely, Re-evaluate pain scale regularly, 2 L of Oxygen by [place], Patient will be re-evaluated for pain in the context of function and relationship to baseline as care progresses. Leukocytosis: Most likely from multifocal pneumonia versus acute chest syndrome.  Patient started on antibiotics.  Will continue to monitor very closely. Anemia of Chronic Disease: Hemoglobin appears stable at baseline today.  There is no clinical indication for blood transfusion at this time. Patient is hemodynamically stable, there is no clinical indication for exchange transfusion but if symptoms worsens, will consider exchange blood transfusion. Will monitor closely and transfuse as appropriate. Chronic pain Syndrome: Restart and continue home pain medications. Tobacco dependence: [place] was counseled on the dangers of tobacco use, and was advised to quit. Reviewed strategies to maximize success, including removing cigarettes and smoking materials from environment, stress management and support of family/friends.   DVT Prophylaxis: Subcut Lovenox   AM Labs Ordered, also please review Full Orders  Family Communication: Admission, patient's condition and plan of care including tests being ordered have been discussed with the patient who indicate understanding and agree  with the plan and Code Status.  Code Status: Full Code  Consults called: None    Admission status: Inpatient    Time spent in minutes : 50 minutes  Angelica Chessman MD, MHA, CPE, FACP 06/27/2022 at 10:05 AM

## 2022-06-28 DIAGNOSIS — J189 Pneumonia, unspecified organism: Secondary | ICD-10-CM | POA: Diagnosis not present

## 2022-06-28 DIAGNOSIS — D5700 Hb-SS disease with crisis, unspecified: Secondary | ICD-10-CM | POA: Diagnosis not present

## 2022-06-28 LAB — CBC
HCT: 22.4 % — ABNORMAL LOW (ref 39.0–52.0)
Hemoglobin: 7.9 g/dL — ABNORMAL LOW (ref 13.0–17.0)
MCH: 33.5 pg (ref 26.0–34.0)
MCHC: 35.3 g/dL (ref 30.0–36.0)
MCV: 94.9 fL (ref 80.0–100.0)
Platelets: 295 10*3/uL (ref 150–400)
RBC: 2.36 MIL/uL — ABNORMAL LOW (ref 4.22–5.81)
RDW: 13.7 % (ref 11.5–15.5)
WBC: 13.6 10*3/uL — ABNORMAL HIGH (ref 4.0–10.5)
nRBC: 0.1 % (ref 0.0–0.2)

## 2022-06-28 NOTE — Progress Notes (Signed)
Patient ID: [name], male   DOB: 30-Mar-1976, 47 y.o.   MRN: [id number] Subjective: [name] is a 47 year old male with a medical history significant for sickle cell disease, chronic pain syndrome, opiate dependence and tolerance, history of tobacco abuse and history of anemia of chronic disease that was admitted for acute chest syndrome in the setting of sickle cell pain crisis.   Patient claims he is much improved this morning, saying his pain is down to about 7/10, his goal is below 5/10 pain intensity. Cough has subsided. No shortness of breath. He denies any fever, chest pain, headache, shortness of breath, or urinary symptoms. No nausea, vomiting or diarrhea.  Objective:  Vital signs in last 24 hours:  Vitals:   06/28/22 0503 06/28/22 0640 06/28/22 0943 06/28/22 1030  BP: 105/73  113/66   Pulse: 67  81   Resp: '14 16  17  '$ Temp: 98.2 F (36.8 C)  98.6 F (37 C)   TempSrc: Oral  Oral   SpO2: 93% 96% 92% 94%  Weight:      Height:        Intake/Output from previous day:   Intake/Output Summary (Last 24 hours) at 06/28/2022 1155 Last data filed at 06/28/2022 0507 Gross per 24 hour  Intake 2022.14 ml  Output 925 ml  Net 1097.14 ml    Physical Exam: General: Alert, awake, oriented x3, in no acute distress.  HEENT: [place]/AT PEERL, EOMI Neck: Trachea midline,  no masses, no thyromegal,y no JVD, no carotid bruit OROPHARYNX:  Moist, No exudate/ erythema/lesions.  Heart: Regular rate and rhythm, without murmurs, rubs, gallops, PMI non-displaced, no heaves or thrills on palpation.  Lungs: Clear to auscultation, no wheezing or rhonchi noted. No increased vocal fremitus resonant to percussion  Abdomen: Soft, nontender, nondistended, positive bowel sounds, no masses no hepatosplenomegaly noted..  Neuro: No focal neurological deficits noted cranial nerves II through XII grossly intact. DTRs 2+ bilaterally upper and lower extremities. Strength 5 out of 5 in bilateral upper and lower  extremities. Musculoskeletal: No warm swelling or erythema around joints, no spinal tenderness noted. Psychiatric: Patient alert and oriented x3, good insight and cognition, good recent to remote recall. Lymph node survey: No cervical axillary or inguinal lymphadenopathy noted.  Lab Results:  Basic Metabolic Panel:    Component Value Date/Time   NA 139 06/27/2022 0220   K 4.2 06/27/2022 0220   CL 104 06/27/2022 0220   CO2 24 06/27/2022 0220   BUN 17 06/27/2022 0220   CREATININE 1.02 06/27/2022 0220   CREATININE 0.93 01/02/2022 0000   GLUCOSE 98 06/27/2022 0220   CALCIUM 10.0 06/27/2022 0220   CBC:    Component Value Date/Time   WBC 13.6 (H) 06/28/2022 0716   HGB 7.9 (L) 06/28/2022 0716   HCT 22.4 (L) 06/28/2022 0716   PLT 295 06/28/2022 0716   MCV 94.9 06/28/2022 0716   NEUTROABS 10.6 (H) 06/27/2022 0220   LYMPHSABS 2.5 06/27/2022 0220   MONOABS 1.4 (H) 06/27/2022 0220   EOSABS 0.8 (H) 06/27/2022 0220   BASOSABS 0.2 (H) 06/27/2022 0220    Recent Results (from the past 240 hour(s))  Culture, blood (routine x 2)     Status: None (Preliminary result)   Collection Time: 06/27/22  7:18 AM   Specimen: BLOOD  Result Value Ref Range Status   Specimen Description   Final    BLOOD BLOOD RIGHT FOREARM Performed at [facility], [place] [address], [place],  [address]  Special Requests   Final    BOTTLES DRAWN AEROBIC AND ANAEROBIC Blood Culture adequate volume Performed at [facility] [address] [place], [place] [address]    Culture   Final    NO GROWTH < 24 HOURS Performed at [facility] [address] [place], [place] [address]    Report Status PENDING  Incomplete  Culture, blood (routine x 2)     Status: None (Preliminary result)   Collection Time: 06/27/22 10:44 AM   Specimen: BLOOD  Result Value Ref Range Status   Specimen Description   Final    BLOOD BLOOD LEFT ARM AEROBIC BOTTLE ONLY Performed at [facility] [address], [place], [place] [address]    Special Requests   Final    BOTTLES DRAWN AEROBIC ONLY Blood Culture adequate volume Performed at [facility] [address] [place], [place] [address]    Culture   Final    NO GROWTH < 24 HOURS Performed at [facility] [address], [place], [place] [address]    Report Status PENDING  Incomplete    Studies/Results: CT Angio Chest PE W/Cm &/Or Wo Cm  Result Date: 06/27/2022 CLINICAL DATA:  Sickle cell pain crisis. Pulmonary embolism suspected. EXAM: CT ANGIOGRAPHY CHEST WITH CONTRAST TECHNIQUE: Multidetector CT imaging of the chest was performed using the standard protocol during bolus administration of intravenous contrast. Multiplanar CT image reconstructions and MIPs were obtained to evaluate the vascular anatomy. RADIATION DOSE REDUCTION: This exam was performed according to the departmental dose-optimization program which includes automated exposure control, adjustment of the mA and/or kV according to patient size and/or use of iterative reconstruction technique. CONTRAST:  20m OMNIPAQUE IOHEXOL 350 MG/ML SOLN COMPARISON:  None Available. FINDINGS: Cardiovascular: The heart size is normal. No substantial pericardial effusion. No thoracic aortic aneurysm. No substantial atherosclerosis of the thoracic aorta. There is no filling defect within the opacified pulmonary arteries to suggest the presence of an acute pulmonary embolus. Mediastinum/Nodes: Upper normal to mild mediastinal lymphadenopathy evident. 10 mm short axis prevascular node on 117/5. 11 mm short axis precarinal node on 01/20 1/5. 13 mm short axis left hilar node on 157/5. The esophagus has normal imaging features. There is no axillary lymphadenopathy. Lungs/Pleura: Centrilobular and paraseptal emphysema evident. 2.8 cm peripheral left upper lobe nodular consolidative opacity noted on 86/6. There is bilateral lower lung  collapse/consolidation, progressive in the interval. 3.0 cm nodular consolidative opacity seen anterior right lower lobe adjacent to the hemidiaphragm on 96/6. No substantial pleural effusion. Upper Abdomen: Asymmetric elevation right hemidiaphragm with prominent stool volume noted in the visualized abdominal segments of the colon. Musculoskeletal: Diffuse bony changes compatible with reported clinical history of sickle cell disease. Review of the MIP images confirms the above findings. IMPRESSION: 1. No CT evidence for acute pulmonary embolus. 2. Bilateral lower lung collapse/consolidation, progressive in the interval. 3. 2.8 cm peripheral left upper lobe nodular consolidative opacity with 3.0 cm nodular consolidative opacity anterior right lower lobe adjacent to the hemidiaphragm. These are likely infectious/inflammatory, compatible with multifocal pneumonia. Septic emboli could have this appearance. 4. Upper normal to mild mediastinal and left hilar lymphadenopathy, likely reactive. Follow-up CT chest with contrast in 3 months recommended to ensure resolution. 5. Diffuse bony changes compatible with reported clinical history of sickle cell disease. 6. Asymmetric elevation right hemidiaphragm. 7. Prominent stool volume in the visualized abdominal segments of the colon. Imaging features could be compatible with constipation in  the appropriate clinical setting. 8.  Emphysema (ICD10-J43.9). Electronically Signed   By: Misty Stanley M.D.   On: 06/27/2022 06:51   DG Chest Port 1 View  Result Date: 06/27/2022 CLINICAL DATA:  Sickle cell crisis EXAM: PORTABLE CHEST 1 VIEW COMPARISON:  10/15/2021 FINDINGS: Cardiac shadow is stable. Persistent scarring is noted in the bases bilaterally stable from the prior exam. No new focal infiltrate is seen. No bony abnormality is noted. IMPRESSION: Stable scarring in the bases bilaterally. Electronically Signed   By: Inez Catalina M.D.   On: 06/27/2022 02:51     Medications: Scheduled Meds:  azithromycin  500 mg Oral Daily   buPROPion ER  150 mg Oral BID   enoxaparin (LOVENOX) injection  40 mg Subcutaneous Daily   HYDROmorphone   Intravenous Q4H   ketorolac  15 mg Intravenous Q6H   morphine  30 mg Oral BID   senna-docusate  1 tablet Oral BID   Continuous Infusions:  sodium chloride 125 mL/hr at 06/28/22 0247   cefTRIAXone (ROCEPHIN)  IV 1 g (06/28/22 1028)   PRN Meds:.diphenhydrAMINE, morphine, naloxone **AND** sodium chloride flush, ondansetron (ZOFRAN) IV, polyethylene glycol, tiZANidine  Consultants: None  Procedures: None  Antibiotics: IV ceftriaxone  Azithromycin p.o.  Assessment/Plan: Active Problems:   Anemia of chronic disease   Leukocytosis   Multifocal pneumonia   Sickle cell crisis acute chest syndrome (HCC)   Chronic pain syndrome   Acute chest syndrome (HCC)  Acute chest syndrome versus multifocal pneumonia: Improving. Continue IV ceftriaxone and p.o. azithromycin. Incentive spirometry. Supplemental oxygen. Hb Sickle Cell Disease with Pain crisis: Reduce IVF to [facility], continue weight based Dilaudid PCA at current dose setting, continue IV Toradol 15 mg Q 6 H for total of 5 days, continue oral home pain medications as ordered.  Monitor vitals very closely, Re-evaluate pain scale regularly, 2 L of Oxygen by [place]. Leukocytosis: Multifactorial, from pneumonia versus acute chest syndrome and sickle cell pain crisis.  Patient is on antibiotics.  Continue to monitor very closely. Anemia of Chronic Disease: Hemoglobin has dropped from 10.0 on admission to 7.9.  This is a significant drop but still not at threshold for blood transfusion.  Will monitor very closely and transfuse as appropriate.  Repeat labs in AM. Chronic pain Syndrome: Continue home medications. Tobacco use disorder: [place] was counseled on the dangers of tobacco use, and was advised to quit. Reviewed strategies to maximize success, including removing cigarettes  and smoking materials from environment, stress management and support of family/friends.   Code Status: Full Code Family Communication: N/A Disposition Plan: Not yet ready for discharge  Mishal Probert  If 7PM-7AM, please contact night-coverage.  06/28/2022, 11:55 AM  LOS: 1 day

## 2022-06-29 LAB — BASIC METABOLIC PANEL
Anion gap: 6 (ref 5–15)
BUN: 13 mg/dL (ref 6–20)
CO2: 26 mmol/L (ref 22–32)
Calcium: 8.6 mg/dL — ABNORMAL LOW (ref 8.9–10.3)
Chloride: 106 mmol/L (ref 98–111)
Creatinine, Ser: 0.86 mg/dL (ref 0.61–1.24)
GFR, Estimated: >60 mL/min (ref >60)
Glucose, Bld: 101 mg/dL — ABNORMAL HIGH (ref 70–99)
Potassium: 4 mmol/L (ref 3.5–5.1)
Sodium: 138 mmol/L (ref 135–145)

## 2022-06-29 LAB — CBC
HCT: 22.2 % — ABNORMAL LOW (ref 39.0–52.0)
Hemoglobin: 8 g/dL — ABNORMAL LOW (ref 13.0–17.0)
MCH: 33.8 pg (ref 26.0–34.0)
MCHC: 36 g/dL (ref 30.0–36.0)
MCV: 93.7 fL (ref 80.0–100.0)
Platelets: 297 10*3/uL (ref 150–400)
RBC: 2.37 MIL/uL — ABNORMAL LOW (ref 4.22–5.81)
RDW: 13.6 % (ref 11.5–15.5)
WBC: 11.3 10*3/uL — ABNORMAL HIGH (ref 4.0–10.5)
nRBC: 0 % (ref 0.0–0.2)

## 2022-06-29 NOTE — Discharge Summary (Signed)
Physician Discharge Summary  AMARIYON MAYNES RJJ:[id number] DOB: [birth year] DOA: 06/27/2022  PCP: Nolene Ebbs, MD  Admit date: 06/27/2022  Discharge date: 06/29/2022  Discharge Diagnoses:  Active Problems:   Anemia of chronic disease   Leukocytosis   Multifocal pneumonia   Sickle cell crisis acute chest syndrome (HCC)   Chronic pain syndrome   Acute chest syndrome [facility])   Discharge Condition: Stable  Disposition:   Follow-up Information     Nolene Ebbs, MD Follow up.   Specialty: Internal Medicine Why: [phone] Contact information: [address] [place]  [address] [phone]                Pt is discharged home in good condition and is to follow up with Nolene Ebbs, MD this week to have labs evaluated. GRACYN SANTILLANES is instructed to increase activity slowly and balance with rest for the next few days, and use prescribed medication to complete treatment of pain  Diet: Regular Wt Readings from Last 3 Encounters:  06/27/22 81.6 kg  10/15/21 78.5 kg  05/13/21 74.8 kg    History of present illness:  Rondey Fallen is a 47 year old male with a medical history significant for sickle cell disease, chronic pain syndrome, opiate dependence and tolerance, anemia of chronic disease and tobacco dependence who came to the emergency room today with major complaints of throbbing chest pain, lower extremity pains, mild cough, and some shortness of breath.  Chest pain is worse on deep breathing.  He took his home pain medications with no sustained relief.  His symptoms started on Monday and have gradually worsened until yesterday when he could not tolerate pain.  He had no fever.  He has occasional cough, which is productive of clear sputum.  He denies any abdominal pain, nausea, vomiting, or diarrhea.  No urinary symptoms.  No joint swelling or redness.  ED course: Vital signs in the emergency room showed: BP 117/71, pulse 70, temperature 98.1 F, respirations 12, and  oxygen saturation 98%.  Comprehensive metabolic panel was unremarkable.  Hemoglobin is stable at baseline of 10.  Patient ruled out CAD.  Lipase normal.  Lactic acid level normal.  LDH 156.  CT angiogram showed bilateral lower lung collapse/consolidation, progressive in the interval with 2.8 cm peripheral left upper lung pulmonary nodule consolidative opacity, likely infectious or inflammatory compatible with multifocal pneumonia.  This is concerning for acute chest syndrome versus multifocal pneumonia.  Patient will be admitted to the hospital for sickle cell pain crisis and possible acute chest syndrome  Hospital Course:  Sickle cell disease with pain crisis and possible acute chest syndrome: Patient was admitted for sickle cell pain crisis and managed appropriately with IVF, IV Dilaudid via PCA and IV Toradol, as well as other adjunct therapies per sickle cell pain management protocols.  IV Dilaudid PCA weaned appropriately. CT of chest was concerning for acute chest syndrome versus multifocal pneumonia.  Patient remained afebrile.  Oxygen saturation remained above 90% on RA throughout admission.  No acute chest syndrome.  Patient to resume home pain medication regimen.  Pain medications are managed by his PCP. Patient will follow-up with PCP in 1 week for medication management and to repeat CBC with differential and CMP. Patient is alert, oriented, and ambulating without assistance.  Vital signs are within normal ranges.  Patient is aware of all upcoming appointments. Smoking cessation recommended, patient expressed understanding. Patient is not on any disease modifying agents at this time.  Recommend follow-up with PCP for  possible referral to hematology.  Patient was therefore discharged home today in a hemodynamically stable condition.   Ardell will follow-up with PCP within 1 week of this discharge. Kellyn was counseled extensively about nonpharmacologic means of pain management, patient  verbalized understanding and was appreciative of  the care received during this admission.   We discussed the need for good hydration, monitoring of hydration status, avoidance of heat, cold, stress, and infection triggers. We discussed the need to be adherent with taking  home medications. Patient was reminded of the need to seek medical attention immediately if any symptom of bleeding, anemia, or infection occurs.  Discharge Exam: Vitals:   06/29/22 0931 06/29/22 0939  BP: 100/67 119/76  Pulse: 64 63  Resp: 16 16  Temp: 98.3 F (36.8 C) 98.4 F (36.9 C)  SpO2: 96% 97%   Vitals:   06/29/22 0546 06/29/22 0722 06/29/22 0931 06/29/22 0939  BP: 117/77  100/67 119/76  Pulse: 64  64 63  Resp: 14  16 16   Temp: 98.1 F (36.7 C)  98.3 F (36.8 C) 98.4 F (36.9 C)  TempSrc: Oral  Oral Oral  SpO2: 97% 96% 96% 97%  Weight:      Height:        General appearance : Awake, alert, not in any distress. Speech Clear. Not toxic looking HEENT: Atraumatic and Normocephalic, pupils equally reactive to light and accomodation Neck: Supple, no JVD. No cervical lymphadenopathy.  Chest: Good air entry bilaterally, no added sounds  CVS: S1 S2 regular, no murmurs.  Abdomen: Bowel sounds present, Non tender and not distended with no gaurding, rigidity or rebound. Extremities: B/L Lower Ext shows no edema, both legs are warm to touch Neurology: Awake alert, and oriented X 3, CN II-XII intact, Non focal Skin: No Rash  Discharge Instructions  Discharge Instructions     Discharge patient   Complete by: As directed    Discharge disposition: 01-Home or Self Care   Discharge patient date: 06/29/2022      Allergies as of 06/29/2022   No Known Allergies      Medication List     TAKE these medications    buPROPion 150 MG 12 hr tablet Commonly known as: ZYBAN Take 150 mg by mouth 2 (two) times daily.   diphenhydrAMINE 25 MG tablet Commonly known as: BENADRYL Take 25 mg by mouth every 6 (six)  hours as needed for itching.   ibuprofen 200 MG tablet Commonly known as: ADVIL Take 200 mg by mouth every 6 (six) hours as needed for moderate pain.   ibuprofen 800 MG tablet Commonly known as: ADVIL Take 800 mg by mouth every 8 (eight) hours as needed for moderate pain.   morphine 30 MG tablet Commonly known as: MSIR Take 1 tablet (30 mg total) by mouth daily as needed for severe pain (pain). Resume after completing dilaudid. What changed:  when to take this additional instructions   morphine 15 MG 12 hr tablet Commonly known as: MS CONTIN Take 30 mg by mouth 2 (two) times daily. What changed: Another medication with the same name was changed. Make sure you understand how and when to take each.   Narcan 4 MG/0.1ML Liqd nasal spray kit Generic drug: naloxone Place 4 mg into the nose once as needed (overdose).   promethazine 25 MG tablet Commonly known as: PHENERGAN Take 25 mg by mouth every 6 (six) hours as needed for nausea.   tiZANidine 4 MG tablet Commonly known as: ZANAFLEX Take 4  mg by mouth daily as needed for muscle spasms.        The results of significant diagnostics from this hospitalization (including imaging, microbiology, ancillary and laboratory) are listed below for reference.    Significant Diagnostic Studies: CT Angio Chest PE W/Cm &/Or Wo Cm  Result Date: 06/27/2022 CLINICAL DATA:  Sickle cell pain crisis. Pulmonary embolism suspected. EXAM: CT ANGIOGRAPHY CHEST WITH CONTRAST TECHNIQUE: Multidetector CT imaging of the chest was performed using the standard protocol during bolus administration of intravenous contrast. Multiplanar CT image reconstructions and MIPs were obtained to evaluate the vascular anatomy. RADIATION DOSE REDUCTION: This exam was performed according to the departmental dose-optimization program which includes automated exposure control, adjustment of the mA and/or kV according to patient size and/or use of iterative reconstruction  technique. CONTRAST:  63mL OMNIPAQUE IOHEXOL 350 MG/ML SOLN COMPARISON:  None Available. FINDINGS: Cardiovascular: The heart size is normal. No substantial pericardial effusion. No thoracic aortic aneurysm. No substantial atherosclerosis of the thoracic aorta. There is no filling defect within the opacified pulmonary arteries to suggest the presence of an acute pulmonary embolus. Mediastinum/Nodes: Upper normal to mild mediastinal lymphadenopathy evident. 10 mm short axis prevascular node on 117/5. 11 mm short axis precarinal node on 01/20 1/5. 13 mm short axis left hilar node on 157/5. The esophagus has normal imaging features. There is no axillary lymphadenopathy. Lungs/Pleura: Centrilobular and paraseptal emphysema evident. 2.8 cm peripheral left upper lobe nodular consolidative opacity noted on 86/6. There is bilateral lower lung collapse/consolidation, progressive in the interval. 3.0 cm nodular consolidative opacity seen anterior right lower lobe adjacent to the hemidiaphragm on 96/6. No substantial pleural effusion. Upper Abdomen: Asymmetric elevation right hemidiaphragm with prominent stool volume noted in the visualized abdominal segments of the colon. Musculoskeletal: Diffuse bony changes compatible with reported clinical history of sickle cell disease. Review of the MIP images confirms the above findings. IMPRESSION: 1. No CT evidence for acute pulmonary embolus. 2. Bilateral lower lung collapse/consolidation, progressive in the interval. 3. 2.8 cm peripheral left upper lobe nodular consolidative opacity with 3.0 cm nodular consolidative opacity anterior right lower lobe adjacent to the hemidiaphragm. These are likely infectious/inflammatory, compatible with multifocal pneumonia. Septic emboli could have this appearance. 4. Upper normal to mild mediastinal and left hilar lymphadenopathy, likely reactive. Follow-up CT chest with contrast in 3 months recommended to ensure resolution. 5. Diffuse bony changes  compatible with reported clinical history of sickle cell disease. 6. Asymmetric elevation right hemidiaphragm. 7. Prominent stool volume in the visualized abdominal segments of the colon. Imaging features could be compatible with constipation in the appropriate clinical setting. 8.  Emphysema (ICD10-J43.9). Electronically Signed   By: Misty Stanley M.D.   On: 06/27/2022 06:51   DG Chest Port 1 View  Result Date: 06/27/2022 CLINICAL DATA:  Sickle cell crisis EXAM: PORTABLE CHEST 1 VIEW COMPARISON:  10/15/2021 FINDINGS: Cardiac shadow is stable. Persistent scarring is noted in the bases bilaterally stable from the prior exam. No new focal infiltrate is seen. No bony abnormality is noted. IMPRESSION: Stable scarring in the bases bilaterally. Electronically Signed   By: Inez Catalina M.D.   On: 06/27/2022 02:51    Microbiology: Recent Results (from the past 240 hour(s))  Culture, blood (routine x 2)     Status: None   Collection Time: 06/27/22  7:18 AM   Specimen: BLOOD  Result Value Ref Range Status   Specimen Description   Final    BLOOD BLOOD RIGHT FOREARM Performed at [facility],  [place] [address], [place], [place] [address]    Special Requests   Final    BOTTLES DRAWN AEROBIC AND ANAEROBIC Blood Culture adequate volume Performed at [place] [address], [place], [place] [address]    Culture   Final    NO GROWTH 5 DAYS Performed at [facility], [place] [address] [place], [place] [address]    Report Status 07/02/2022 FINAL  Final  Culture, blood (routine x 2)     Status: None   Collection Time: 06/27/22 10:44 AM   Specimen: BLOOD  Result Value Ref Range Status   Specimen Description   Final    BLOOD BLOOD LEFT ARM AEROBIC BOTTLE ONLY Performed at [place] [address] [place], [place] [address]    Special Requests   Final    BOTTLES DRAWN AEROBIC ONLY Blood Culture adequate volume Performed at [place] [address], [place], [place] [address]    Culture   Final    NO GROWTH 5 DAYS Performed at [facility], [place] [address] [place], [place] [address]    Report Status 07/02/2022 FINAL  Final     Labs: Basic Metabolic Panel: Recent Labs  Lab 06/27/22 0220 06/29/22 0518  NA 139 138  K 4.2 4.0  CL 104 106  CO2 24 26  GLUCOSE 98 101*  BUN 17 13  CREATININE 1.02 0.86  CALCIUM 10.0 8.6*   Liver Function Tests: Recent Labs  Lab 06/27/22 0220  AST 24  ALT 13  ALKPHOS 87  BILITOT 1.4*  PROT 9.0*  ALBUMIN 4.5   Recent Labs  Lab 06/27/22 0220  LIPASE 33   No results for input(s): "AMMONIA" in the last 168 hours. CBC: Recent Labs  Lab 06/27/22 0220 06/28/22 0716 06/29/22 0518  WBC 15.5* 13.6* 11.3*  NEUTROABS 10.6*  --   --   HGB 10.0* 7.9* 8.0*  HCT 27.7* 22.4* 22.2*  MCV 92.0 94.9 93.7  PLT 362 295 297   Cardiac Enzymes: No results for input(s): "CKTOTAL", "CKMB", "CKMBINDEX", "TROPONINI" in the last 168 hours. BNP: Invalid input(s): "POCBNP" CBG: No results for input(s): "GLUCAP" in the last 168 hours.  Time coordinating discharge: 30 minutes  Signed:  Donia Pounds  APRN, MSN, FNP-C Patient [place],  [id number] [phone]  [facility] 07/02/2022, 6:01 PM

## 2022-07-02 LAB — CULTURE, BLOOD (ROUTINE X 2)
Culture: NO GROWTH
Culture: NO GROWTH
Special Requests: ADEQUATE
Special Requests: ADEQUATE

## 2022-07-31 ENCOUNTER — Ambulatory Visit
Admission: EM | Admit: 2022-07-31 | Discharge: 2022-07-31 | Disposition: A | Discharge status: Home / Self Care | Payer: 59 | Attending: Physician Assistant | Admitting: Physician Assistant

## 2022-07-31 DIAGNOSIS — K0889 Other specified disorders of teeth and supporting structures: Secondary | ICD-10-CM

## 2022-07-31 DIAGNOSIS — K047 Periapical abscess without sinus: Secondary | ICD-10-CM

## 2022-07-31 MED ORDER — NAPROXEN SODIUM 550 MG PO TABS: 550.0000 mg | ORAL_TABLET | Freq: Two times a day (BID) | ORAL | 0 refills | Status: DC

## 2022-07-31 MED ORDER — PENICILLIN V POTASSIUM 500 MG PO TABS: 500.0000 mg | ORAL_TABLET | Freq: Four times a day (QID) | ORAL | 0 refills | Status: AC

## 2022-07-31 NOTE — Discharge Instructions (Addendum)
Advised to take the Pen-Vee K 500 mg, 1 every 6 hours or 2 in the morning 2 in the evening on a regular basis till completed to treat infection. Advised take Anaprox DS 550 mg, 1 every 12 hours with food to help reduce pain and discomfort.  Refer to the reference sheet given for dentist so that she may be able to arrange an appointment to be seen and evaluated 1 within the next week to 10 days.  Advised follow-up PCP return to urgent care as needed.

## 2022-07-31 NOTE — ED Triage Notes (Signed)
Pt c/o left sided dental and jaw pain x 2 days Eating makes sx worse  Pt has taken ibuprofen to help with pain but it does not seem to help.

## 2022-07-31 NOTE — ED Provider Notes (Signed)
[facility]    CSN: [id number] Arrival date & time: 07/31/22  1017      History   Chief Complaint Chief Complaint  Patient presents with   Dental Pain    HPI [name] is a 47 y.o. male.   47 year old male presents with left lower tooth pain.  Patient indicates that he broke off a molar on the left lower side, and over the past 2 days he has been having increasing tooth pain, discomfort, and mild swelling of the jaw at the site.  He indicates he does not have fever or chills.  He has been using some OTC ibuprofen which has not relieved his pain.  Patient also indicates that he is taking morphine on a regular basis both long-acting and short but this also has not relieved his discomfort.  Patient indicates that he does not have a dentist at the present time.  He is tolerating fluids well.   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   Chronic pain syndrome 04/29/2018   Acute chest syndrome    Sickle cell anemia with crisis 01/22/2018   Sickle cell crisis 01/20/2017   Multifocal pneumonia 04/21/2016   Sickle cell anemia 08/13/2015   Sickle cell disease, type [place] 10/18/2012   Sickle cell pain crisis 10/13/2012   History of tobacco abuse 10/13/2012   Leukocytosis 05/02/2012   Anemia of chronic disease 11/08/2011   Avascular necrosis of hip, left (HCC) 08/27/2011   Tobacco abuse 05/15/2011    Past Surgical History:  Procedure Laterality Date   BONE GRAFT HIP ILIAC CREST     JOINT REPLACEMENT  2006   right total hip  arthroplasty   Orif right hip fracture  1995       Home Medications    Prior to Admission medications   Medication Sig Start Date End Date Taking? Authorizing Provider  naproxen sodium (ANAPROX DS) 550 MG tablet Take 1 tablet (550 mg total) by mouth 2 (two) times daily with a meal. 07/31/22  Yes Ellsworth Lennox, PA-C  penicillin v potassium (VEETID) 500 MG tablet Take 1 tablet (500 mg total) by mouth 4 (four) times daily for 10 days. 07/31/22 08/10/22 Yes Ellsworth Lennox, PA-C  buPROPion (ZYBAN) 150 MG 12 hr tablet Take 150 mg by mouth 2 (two) times daily. 05/04/21   [provider]  diphenhydrAMINE (BENADRYL) 25 MG tablet Take 25 mg by mouth every 6 (six) hours as needed for itching.    [provider]  morphine (MS CONTIN) 15 MG 12 hr tablet Take 30 mg by mouth 2 (two) times daily. 09/11/20   [provider]  morphine (MSIR) 30 MG tablet Take 1 tablet (30 mg total) by mouth daily as needed for severe pain (pain). Resume after completing dilaudid. Patient taking differently: Take 30 mg by mouth 2 (two) times daily as needed for severe pain (pain). 02/17/16   Altha Harm, MD  NARCAN 4 MG/0.1ML LIQD nasal spray kit Place 4  mg into the nose once as needed (overdose). 12/16/17   [provider]  promethazine (PHENERGAN) 25 MG tablet Take 25 mg by mouth every 6 (six) hours as needed for nausea.    [provider]  tiZANidine (ZANAFLEX) 4 MG tablet Take 4 mg by mouth daily as needed for muscle spasms. 09/05/20   [provider]    Family History Family History  Adopted: Yes    Social History Social History   Tobacco Use   Smoking status: Some Days    Packs/day: .5    Types: Cigarettes    Last attempt to quit: 01/27/2012    Years since quitting: 10.5   Smokeless tobacco: Never  Vaping Use   Vaping Use: Never used  Substance Use Topics   Alcohol use: No   Drug use: No     Allergies   Patient has no known allergies.   Review of  Systems Review of Systems  HENT:  Positive for dental problem (left lower molar).      Physical Exam Triage Vital Signs ED Triage Vitals  Enc Vitals Group     BP 07/31/22 1040 122/78     Pulse Rate 07/31/22 1040 74     Resp 07/31/22 1040 20     Temp 07/31/22 1040 98.4 F (36.9 C)     Temp Source 07/31/22 1040 Oral     SpO2 07/31/22 1040 93 %     Weight --      Height --      Head Circumference --      Peak Flow --      Pain Score 07/31/22 1048 10     Pain Loc --      Pain Edu? --      Excl. in GC? --    No data found.  Updated Vital Signs BP 122/78 (BP Location: Left Arm)   Pulse 74   Temp 98.4 F (36.9 C) (Oral)   Resp 20   SpO2 93%   Visual Acuity Right Eye Distance:   Left Eye Distance:   Bilateral Distance:    Right Eye Near:   Left Eye Near:    Bilateral Near:     Physical Exam Constitutional:      Appearance: Normal appearance.  HENT:     Mouth/Throat:      Comments: Mouth: There is a broken molar present, mild tenderness on palpation of the jawline at the site, minimal swelling is present, no redness, no drainage along the gumline. Neurological:     Mental Status: He is alert.      UC Treatments / Results  Labs (all labs ordered are listed, but only abnormal results are displayed) Labs Reviewed - No data to display  EKG   Radiology No results found.  Procedures Procedures (including critical care time)  Medications Ordered in UC Medications - No data to display  Initial Impression / Assessment and Plan / UC Course  I have reviewed the triage vital signs and the nursing notes.  Pertinent labs & imaging results that were available during my care of the patient were reviewed by me and considered in my medical decision making (see chart for details).    Plan: The diagnosis will be treated with the following: 1.  Tooth pain: A.  Anaprox DS 550 mg, 1 every 12 hours with food to help reduce pain. 2.  Tooth abscess: A.  Pen-Vee K  500 mg, 1 tablet every 6 hours or to the morning  2 in the evening to treat infection. 3.  Patient advised to see dentist to have the area evaluated and repaired.  Dental resource sheet has been given to the patient. 4.  Advised to return to urgent care as needed.  Final Clinical Impressions(s) / UC Diagnoses   Final diagnoses:  Tooth pain  Tooth abscess     Discharge Instructions      Advised to take the Pen-Vee K 500 mg, 1 every 6 hours or 2 in the morning 2 in the evening on a regular basis till completed to treat infection. Advised take Anaprox DS 550 mg, 1 every 12 hours with food to help reduce pain and discomfort.  Refer to the reference sheet given for dentist so that she may be able to arrange an appointment to be seen and evaluated 1 within the next week to 10 days.  Advised follow-up PCP return to urgent care as needed.    ED Prescriptions     Medication Sig Dispense Auth. Provider   penicillin v potassium (VEETID) 500 MG tablet Take 1 tablet (500 mg total) by mouth 4 (four) times daily for 10 days. 40 tablet Ellsworth LennoxJames, Laddie Naeem, PA-C   naproxen sodium (ANAPROX DS) 550 MG tablet Take 1 tablet (550 mg total) by mouth 2 (two) times daily with a meal. 20 tablet Ellsworth LennoxJames, Vilas Edgerly, PA-C      PDMP not reviewed this encounter.   Ellsworth LennoxJames, Ednamae Schiano, PA-C 07/31/22 1103

## 2022-08-22 ENCOUNTER — Ambulatory Visit
Admission: EM | Admit: 2022-08-22 | Discharge: 2022-08-22 | Disposition: A | Discharge status: Home / Self Care | Payer: 59 | Attending: Urgent Care | Admitting: Urgent Care

## 2022-08-22 DIAGNOSIS — G894 Chronic pain syndrome: Secondary | ICD-10-CM | POA: Diagnosis not present

## 2022-08-22 DIAGNOSIS — K047 Periapical abscess without sinus: Secondary | ICD-10-CM

## 2022-08-22 MED ORDER — AMOXICILLIN-POT CLAVULANATE 875-125 MG PO TABS: 1.0000 | ORAL_TABLET | Freq: Two times a day (BID) | ORAL | 0 refills | Status: AC

## 2022-08-22 MED ORDER — NAPROXEN 500 MG PO TABS: 500.0000 mg | ORAL_TABLET | Freq: Two times a day (BID) | ORAL | 0 refills | Status: AC

## 2022-08-22 NOTE — ED Provider Notes (Signed)
[facility]  Note:  This document was prepared using Conservation officer, historic buildings and may include unintentional dictation errors.  MRN: [id number] DOB: Apr 09, 1976  Subjective:   [name] is a 47 y.o. male presenting for 2-day history of recurrent persistent subacute left-sided lower dental pain, facial swelling.  Was seen earlier this month and given the penicillin injection.  Has not follow-up with a dentist.  Has a history of dental issues, dental work.  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  No current facility-administered medications for this encounter.  Current Outpatient Medications:    buPROPion (ZYBAN) 150 MG 12 hr tablet, Take 150 mg by mouth 2 (two) times daily., Disp: , Rfl:    diphenhydrAMINE (BENADRYL) 25 MG tablet, Take 25 mg by mouth every 6 (six) hours as needed for itching., Disp: , Rfl:    morphine (MS CONTIN) 15 MG 12 hr tablet, Take 30 mg by mouth 2 (two) times daily., Disp: , Rfl:    morphine (MSIR) 30 MG tablet, Take 1 tablet (30 mg total) by mouth daily as needed for severe pain (pain). Resume after completing dilaudid. (Patient taking differently: Take 30 mg by mouth 2 (two) times daily as needed for severe pain (pain).), Disp: 30 tablet, Rfl: 0   naproxen sodium (ANAPROX DS) 550 MG tablet, Take 1 tablet (550 mg total) by mouth 2 (two) times daily with a meal., Disp: 20 tablet, Rfl: 0   NARCAN 4 MG/0.1ML LIQD nasal spray kit, Place 4 mg into the nose once as needed (overdose)., Disp: , Rfl: 0   promethazine (PHENERGAN) 25 MG tablet, Take 25 mg by mouth every 6 (six) hours as needed for nausea., Disp: , Rfl:    tiZANidine (ZANAFLEX) 4 MG tablet, Take 4 mg by mouth daily as needed for muscle spasms., Disp: , Rfl:    No Known Allergies  Past Medical History:  Diagnosis Date   Avascular necrosis of hip (HCC)    bilateral   Avascular necrosis of hip, left (HCC) 08/27/2011   Blood transfusion    Infection of  bone, shoulder region [facility])    left shoulder   Pneumonia    Sickle cell crisis (HCC)      Past Surgical History:  Procedure Laterality Date   BONE GRAFT HIP ILIAC CREST     JOINT REPLACEMENT  2006   right total hip arthroplasty   Orif right hip fracture  1995    Family History  Adopted: Yes    Social History   Tobacco Use   Smoking status: Every Day    Types: Cigarettes   Smokeless tobacco: Never  Vaping Use   Vaping Use: Never used  Substance Use Topics   Alcohol use: No   Drug use: No    ROS   Objective:   Vitals: BP (!) 166/95 (BP Location: Right Arm)   Pulse 76   Temp 98.9 F (37.2 C) (Oral)   Resp 20   SpO2 95%   Physical Exam Constitutional:      General: He is not in acute distress.    Appearance: Normal appearance. He is well-developed and normal weight. He is not ill-appearing, toxic-appearing or diaphoretic.  HENT:     Head: Normocephalic and atraumatic.     Right Ear: External ear normal.     Left Ear: External ear normal.     Nose: Nose normal.     Mouth/Throat:     Pharynx: Oropharynx is clear.  Eyes:     General: No scleral icterus.       Right eye: No discharge.        Left eye: No discharge.     Extraocular Movements: Extraocular movements intact.  Cardiovascular:     Rate and Rhythm: Normal rate.  Pulmonary:     Effort: Pulmonary effort is normal.  Musculoskeletal:     Cervical back: Normal range of motion.  Neurological:     Mental Status: He is alert and oriented to person, place, and time.  Psychiatric:        Mood and Affect: Mood normal.        Behavior: Behavior normal.        Thought Content: Thought content normal.        Judgment: Judgment normal.     Assessment and Plan :   I have reviewed the PDMP during this encounter.  1. Dental abscess   2. Chronic pain syndrome     Start Augmentin for dental infection/abscess, use naproxen for pain and inflammation. Emphasized need for dental surgeon consult.  Counseled patient on potential for adverse effects with medications prescribed/recommended today, strict ER and return-to-clinic precautions discussed, patient verbalized understanding.    Wallis Bamberg, [place] 08/22/22 1456

## 2022-08-22 NOTE — Discharge Instructions (Addendum)
Make sure you schedule an appointment with a dentist/dental surgeon as soon as possible.  You may try some of the resources below.    [facility] in [place] [place] Address: [address], [place], [place] [address] Phone: [phone]  [facility] [phone] extension [phone]  Dr. Civils [phone] [address]  [facility] [phone] [address]  [facility] [phone] extension [phone] [place], [place], [address] First come first serve for the first 10 clients.  May do simple extractions only, no wisdom teeth or surgery.  You may try the second for Thursday of the month starting at 6:30 AM.  [employer] You may call the school to see if they are still helping to provide dental care for emergent cases.  

## 2022-08-22 NOTE — ED Triage Notes (Addendum)
Pt c/o left lower dental pain x 2 days-woke with am with swelling to left jaw-NAD-steady gait-pt seen at [facility] for same 4/5-pt states " I had a week or so where it wasn't hurting"

## 2023-06-04 ENCOUNTER — Inpatient Hospital Stay (HOSPITAL_COMMUNITY)
Admission: EM | Admit: 2023-06-04 | Discharge: 2023-06-09 | DRG: 812 | Disposition: A | Discharge status: Home / Self Care | Payer: 59 | Attending: Internal Medicine | Admitting: Internal Medicine

## 2023-06-04 ENCOUNTER — Encounter (HOSPITAL_COMMUNITY): Payer: Self-pay

## 2023-06-04 ENCOUNTER — Emergency Department (HOSPITAL_COMMUNITY): Payer: 59

## 2023-06-04 ENCOUNTER — Other Ambulatory Visit: Payer: Self-pay

## 2023-06-04 DIAGNOSIS — Z716 Tobacco abuse counseling: Secondary | ICD-10-CM

## 2023-06-04 DIAGNOSIS — Z79899 Other long term (current) drug therapy: Secondary | ICD-10-CM

## 2023-06-04 DIAGNOSIS — F17210 Nicotine dependence, cigarettes, uncomplicated: Secondary | ICD-10-CM | POA: Diagnosis present

## 2023-06-04 DIAGNOSIS — M545 Low back pain, unspecified: Secondary | ICD-10-CM

## 2023-06-04 DIAGNOSIS — D5700 Hb-SS disease with crisis, unspecified: Secondary | ICD-10-CM | POA: Diagnosis not present

## 2023-06-04 DIAGNOSIS — Z96641 Presence of right artificial hip joint: Secondary | ICD-10-CM | POA: Diagnosis present

## 2023-06-04 DIAGNOSIS — R0789 Other chest pain: Secondary | ICD-10-CM | POA: Diagnosis not present

## 2023-06-04 DIAGNOSIS — G894 Chronic pain syndrome: Secondary | ICD-10-CM | POA: Diagnosis present

## 2023-06-04 DIAGNOSIS — D638 Anemia in other chronic diseases classified elsewhere: Secondary | ICD-10-CM | POA: Diagnosis present

## 2023-06-04 DIAGNOSIS — Z720 Tobacco use: Secondary | ICD-10-CM | POA: Diagnosis present

## 2023-06-04 LAB — COMPREHENSIVE METABOLIC PANEL
ALT: 21 U/L (ref 0–44)
AST: 31 U/L (ref 15–41)
Albumin: 4.4 g/dL (ref 3.5–5.0)
Alkaline Phosphatase: 81 U/L (ref 38–126)
Anion gap: 9 (ref 5–15)
BUN: 16 mg/dL (ref 6–20)
CO2: 23 mmol/L (ref 22–32)
Calcium: 8.9 mg/dL (ref 8.9–10.3)
Chloride: 104 mmol/L (ref 98–111)
Creatinine, Ser: 0.96 mg/dL (ref 0.61–1.24)
GFR, Estimated: >60 mL/min (ref >60)
Glucose, Bld: 84 mg/dL (ref 70–99)
Potassium: 4.1 mmol/L (ref 3.5–5.1)
Sodium: 136 mmol/L (ref 135–145)
Total Bilirubin: 1.4 mg/dL — ABNORMAL HIGH (ref 0.0–1.2)
Total Protein: 8 g/dL (ref 6.5–8.1)

## 2023-06-04 LAB — RETICULOCYTES
Immature Retic Fract: 28.7 % — ABNORMAL HIGH (ref 2.3–15.9)
RBC.: 2.63 MIL/uL — ABNORMAL LOW (ref 4.22–5.81)
Retic Count, Absolute: 93.9 10*3/uL (ref 19.0–186.0)
Retic Ct Pct: 3.6 % — ABNORMAL HIGH (ref 0.4–3.1)

## 2023-06-04 LAB — CBC WITH DIFFERENTIAL/PLATELET
Abs Immature Granulocytes: 0.1 10*3/uL — ABNORMAL HIGH (ref 0.00–0.07)
Basophils Absolute: 0.1 10*3/uL (ref 0.0–0.1)
Basophils Relative: 1 %
Eosinophils Absolute: 0.8 10*3/uL — ABNORMAL HIGH (ref 0.0–0.5)
Eosinophils Relative: 7 %
HCT: 24.4 % — ABNORMAL LOW (ref 39.0–52.0)
Hemoglobin: 8.7 g/dL — ABNORMAL LOW (ref 13.0–17.0)
Immature Granulocytes: 1 %
Lymphocytes Relative: 29 %
Lymphs Abs: 3.4 10*3/uL (ref 0.7–4.0)
MCH: 33.2 pg (ref 26.0–34.0)
MCHC: 35.7 g/dL (ref 30.0–36.0)
MCV: 93.1 fL (ref 80.0–100.0)
Monocytes Absolute: 1.3 10*3/uL — ABNORMAL HIGH (ref 0.1–1.0)
Monocytes Relative: 11 %
Neutro Abs: 6 10*3/uL (ref 1.7–7.7)
Neutrophils Relative %: 51 %
Platelets: 302 10*3/uL (ref 150–400)
RBC: 2.62 MIL/uL — ABNORMAL LOW (ref 4.22–5.81)
RDW: 13.2 % (ref 11.5–15.5)
WBC: 11.7 10*3/uL — ABNORMAL HIGH (ref 4.0–10.5)
nRBC: 0.2 % (ref 0.0–0.2)

## 2023-06-04 MED ORDER — SODIUM CHLORIDE 0.9 % IV BOLUS
1000.0000 mL | Freq: Once | INTRAVENOUS | Status: AC
  Administered 2023-06-04: 1000 mL via INTRAVENOUS

## 2023-06-04 MED ORDER — HYDROMORPHONE HCL 1 MG/ML IJ SOLN
1.0000 mg | Freq: Once | INTRAMUSCULAR | Status: AC
  Administered 2023-06-04: 1 mg via INTRAVENOUS
  Filled 2023-06-04: qty 1

## 2023-06-04 MED ORDER — DIPHENHYDRAMINE HCL 50 MG/ML IJ SOLN
25.0000 mg | Freq: Once | INTRAMUSCULAR | Status: AC
  Administered 2023-06-04: 25 mg via INTRAVENOUS
  Filled 2023-06-04: qty 1

## 2023-06-04 MED ORDER — ONDANSETRON HCL 4 MG/2ML IJ SOLN
4.0000 mg | Freq: Once | INTRAMUSCULAR | Status: AC
  Administered 2023-06-04: 4 mg via INTRAVENOUS
  Filled 2023-06-04: qty 2

## 2023-06-04 MED ORDER — IOHEXOL 350 MG/ML SOLN
75.0000 mL | Freq: Once | INTRAVENOUS | Status: AC | PRN
  Administered 2023-06-04: 75 mL via INTRAVENOUS

## 2023-06-04 NOTE — ED Provider Notes (Signed)
 [place] EMERGENCY DEPARTMENT AT [facility] Provider Note   CSN: [id number] Arrival date & time: 06/04/23  1850     History  Chief Complaint  Patient presents with   Sickle Cell Pain Crisis   HPI [name]. is a 48 y.o. male with history of sickle cell disease presenting for pain.  Pain is located under the lower ribs bilaterally. Non radiating otherwise but made worse with coughing and deep breathing. Also located in all about the lower back.  States he has had a intermittent cough this week and did have a fever on Monday during his home health visit.  Has been taking his p.o. morphine  but has not helped.  Denies shortness of breath at this time. States the cough has been nonproductive. Denies nausea vomiting diarrhea and abdominal pain. States his pain today is similar to pain crises he has had in the past.  Patient states that the lower back pain is similar to when he is in pain crisis.   Sickle Cell Pain Crisis      Home Medications Prior to Admission medications   Medication Sig Start Date End Date Taking? Authorizing Provider  amoxicillin -clavulanate (AUGMENTIN ) 875-125 MG tablet Take 1 tablet by mouth 2 (two) times daily. 08/22/22   Christopher Savannah, PA-C  buPROPion  (ZYBAN ) 150 MG 12 hr tablet Take 150 mg by mouth 2 (two) times daily. 05/04/21   [provider]  diphenhydrAMINE  (BENADRYL ) 25 MG tablet Take 25 mg by mouth every 6 (six) hours as needed for itching.    [provider]  morphine  (MS CONTIN ) 15 MG 12 hr tablet Take 30 mg by mouth 2 (two) times daily. 09/11/20   [provider]  morphine  (MSIR) 30 MG tablet Take 1 tablet (30 mg total) by mouth daily as needed for severe pain (pain). Resume after completing dilaudid . Patient taking differently: Take 30 mg by mouth 2 (two) times daily as needed for severe pain (pain). 02/17/16   Alvia Rosaline LABOR, MD  naproxen  (NAPROSYN ) 500 MG tablet Take 1 tablet (500 mg total) by mouth 2  (two) times daily with a meal. 08/22/22   Christopher Savannah, PA-C  NARCAN  4 MG/0.1ML LIQD nasal spray kit Place 4 mg into the nose once as needed (overdose). 12/16/17   [provider]  promethazine  (PHENERGAN ) 25 MG tablet Take 25 mg by mouth every 6 (six) hours as needed for nausea.    [provider]  tiZANidine  (ZANAFLEX ) 4 MG tablet Take 4 mg by mouth daily as needed for muscle spasms. 09/05/20   [provider]      Allergies    Patient has no known allergies.    Review of Systems   See HPI for pertinent positives  Physical Exam Updated Vital Signs BP 108/72 (BP Location: Left Arm)   Pulse 68   Temp (!) 97.5 F (36.4 C) (Oral)   Resp 18   Ht 6' 2 (1.88 m)   Wt 83.9 kg   SpO2 99%   BMI 23.75 kg/m  Physical Exam Vitals and nursing note reviewed.  HENT:     Head: Normocephalic and atraumatic.     Mouth/Throat:     Mouth: Mucous membranes are moist.  Eyes:     General:        Right eye: No discharge.        Left eye: No discharge.     Conjunctiva/sclera: Conjunctivae normal.  Cardiovascular:     Rate and Rhythm: Normal  rate and regular rhythm.     Pulses: Normal pulses.     Heart sounds: Normal heart sounds.  Pulmonary:     Effort: Pulmonary effort is normal.     Breath sounds: Normal breath sounds and air entry. No wheezing, rhonchi or rales.  Abdominal:     General: Abdomen is flat.     Palpations: Abdomen is soft.  Skin:    General: Skin is warm and dry.  Neurological:     General: No focal deficit present.  Psychiatric:        Mood and Affect: Mood normal.     ED Results / Procedures / Treatments   Labs (all labs ordered are listed, but only abnormal results are displayed) Labs Reviewed  COMPREHENSIVE METABOLIC PANEL - Abnormal; Notable for the following components:      Result Value   Total Bilirubin 1.4 (*)    All other components within normal limits  CBC WITH DIFFERENTIAL/PLATELET - Abnormal; Notable for the following  components:   WBC 11.7 (*)    RBC 2.62 (*)    Hemoglobin 8.7 (*)    HCT 24.4 (*)    Monocytes Absolute 1.3 (*)    Eosinophils Absolute 0.8 (*)    Abs Immature Granulocytes 0.10 (*)    All other components within normal limits  RETICULOCYTES - Abnormal; Notable for the following components:   Retic Ct Pct 3.6 (*)    RBC. 2.63 (*)    Immature Retic Fract 28.7 (*)    All other components within normal limits  TROPONIN I (HIGH SENSITIVITY)    EKG None  Radiology DG Chest 2 View Result Date: 06/04/2023 CLINICAL DATA:  Sickle cell crisis pain involving the ribs and lower back. EXAM: CHEST - 2 VIEW COMPARISON:  June 27, 2022 FINDINGS: The heart size and mediastinal contours are within normal limits. Very mild bibasilar scarring and/or atelectasis is seen. There is no evidence of an acute infiltrate, pleural effusion or pneumothorax. The visualized skeletal structures are unremarkable. IMPRESSION: Very mild bibasilar scarring and/or atelectasis. Electronically Signed   By: Suzen Dials M.D.   On: 06/04/2023 22:14    Procedures Procedures    Medications Ordered in ED Medications  sodium chloride  0.9 % bolus 1,000 mL (1,000 mLs Intravenous New Bag/Given 06/04/23 2142)  HYDROmorphone  (DILAUDID ) injection 1 mg (1 mg Intravenous Given 06/04/23 2142)  ondansetron  (ZOFRAN ) injection 4 mg (4 mg Intravenous Given 06/04/23 2142)  HYDROmorphone  (DILAUDID ) injection 1 mg (1 mg Intravenous Given 06/04/23 2250)  diphenhydrAMINE  (BENADRYL ) injection 25 mg (25 mg Intravenous Given 06/04/23 2253)    ED Course/ Medical Decision Making/ A&P Clinical Course as of 06/04/23 2318  Fri Jun 04, 2023  2310 Patient endorsed some [JR]    Clinical Course User Index [JR] Lang Norleen POUR, PA-C                                 Medical Decision Making Amount and/or Complexity of Data Reviewed Labs: ordered. Radiology: ordered.  Risk Prescription drug management.   Initial Impression and Ddx 48 year old  well-appearing male presenting for chest pain and lower back pain with history of sickle cell disease.  Exam is unremarkable.  DDx includes acute chest syndrome, PE, sickle cell pain crisis, pneumonia, ACS, other. Patient PMH that increases complexity of ED encounter: Sickle cell disease, multiple admissions for acute chest syndrome.  Interpretation of Diagnostics - I independent reviewed and interpreted the  labs as followed: Leukocytosis  - I independently visualized the following imaging with scope of interpretation limited to determining acute life threatening conditions related to emergency care: CXR, which revealed very mild bibasilar scarring and/or atelectasis  - EKG is pending  Patient Reassessment and Ultimate Disposition/Management Per chart review, patient has had multiple admissions for acute chest syndrome.  Most recently in March 2024 presented with atypical chest pain that was worse with breathing initial x-ray at that time was unremarkable but was followed by chest CTA which revealed concerns for multifocal pneumonia.  Was subsequently admitted for acute syndrome after those findings.  On reassessment here, chest pain has been mildly improved with 1 mg of IV Dilaudid .  Ordered another IV dose of Dilaudid .  X-ray was unremarkable here but given his persistent chest pain and history of acute chest syndrome, felt to warranted further characterization with chest CTA also will help to evaluate for PE as well.  Signed out to PA [employer].  Plan at this time will be to reassess his pain and follow-up on chest CTA.  If chest CTA concerning, will likely need admission.  Patient management required discussion with the following services or consulting groups:  None  Complexity of Problems Addressed Acute uncomplicated illness or injury with no diagnostics  Additional Data Reviewed and Analyzed Further history obtained from: Past medical history and medications listed in the EMR and  Prior ED visit notes  Patient Encounter Risk Assessment Consideration of hospitalization         Final Clinical Impression(s) / ED Diagnoses Final diagnoses:  Atypical chest pain  Low back pain, unspecified back pain laterality, unspecified chronicity, unspecified whether sciatica present    Rx / DC Orders ED Discharge Orders     None         Lang Norleen POUR, PA-C 06/04/23 2334    Cottie Donnice PARAS, MD 06/04/23 [phone]

## 2023-06-04 NOTE — ED Triage Notes (Signed)
 Patient said he is having a sickle cell pain crisis for 4 days. Complaining of rib pain and lower back pain. Took 30mg  morphine  at 3pm.

## 2023-06-05 DIAGNOSIS — D5700 Hb-SS disease with crisis, unspecified: Secondary | ICD-10-CM

## 2023-06-05 DIAGNOSIS — Z79899 Other long term (current) drug therapy: Secondary | ICD-10-CM | POA: Diagnosis not present

## 2023-06-05 DIAGNOSIS — G894 Chronic pain syndrome: Secondary | ICD-10-CM | POA: Diagnosis present

## 2023-06-05 DIAGNOSIS — R0789 Other chest pain: Secondary | ICD-10-CM | POA: Diagnosis present

## 2023-06-05 DIAGNOSIS — Z96641 Presence of right artificial hip joint: Secondary | ICD-10-CM | POA: Diagnosis present

## 2023-06-05 DIAGNOSIS — Z716 Tobacco abuse counseling: Secondary | ICD-10-CM | POA: Diagnosis not present

## 2023-06-05 DIAGNOSIS — D638 Anemia in other chronic diseases classified elsewhere: Secondary | ICD-10-CM | POA: Diagnosis present

## 2023-06-05 DIAGNOSIS — F17210 Nicotine dependence, cigarettes, uncomplicated: Secondary | ICD-10-CM | POA: Diagnosis present

## 2023-06-05 LAB — CBC
HCT: 24 % — ABNORMAL LOW (ref 39.0–52.0)
Hemoglobin: 8.5 g/dL — ABNORMAL LOW (ref 13.0–17.0)
MCH: 32.8 pg (ref 26.0–34.0)
MCHC: 35.4 g/dL (ref 30.0–36.0)
MCV: 92.7 fL (ref 80.0–100.0)
Platelets: 276 10*3/uL (ref 150–400)
RBC: 2.59 MIL/uL — ABNORMAL LOW (ref 4.22–5.81)
RDW: 14.5 % (ref 11.5–15.5)
WBC: 12.8 10*3/uL — ABNORMAL HIGH (ref 4.0–10.5)
nRBC: 0.2 % (ref 0.0–0.2)

## 2023-06-05 LAB — TROPONIN I (HIGH SENSITIVITY): Troponin I (High Sensitivity): <2 ng/L (ref <18)

## 2023-06-05 LAB — CREATININE, SERUM
Creatinine, Ser: 1.03 mg/dL (ref 0.61–1.24)
GFR, Estimated: >60 mL/min (ref >60)

## 2023-06-05 LAB — HIV ANTIBODY (ROUTINE TESTING W REFLEX): HIV Screen 4th Generation wRfx: NONREACTIVE

## 2023-06-05 MED ORDER — MORPHINE SULFATE ER 15 MG PO TBCR
30.0000 mg | EXTENDED_RELEASE_TABLET | Freq: Two times a day (BID) | ORAL | Status: DC
  Administered 2023-06-05 – 2023-06-09 (×9): 30 mg via ORAL
  Filled 2023-06-05 (×9): qty 2

## 2023-06-05 MED ORDER — HYDROMORPHONE HCL 2 MG/ML IJ SOLN
2.0000 mg | Freq: Once | INTRAMUSCULAR | Status: AC
  Administered 2023-06-05: 2 mg via INTRAVENOUS
  Filled 2023-06-05: qty 1

## 2023-06-05 MED ORDER — SODIUM CHLORIDE 0.45 % IV SOLN: INTRAVENOUS | Status: AC

## 2023-06-05 MED ORDER — ONDANSETRON HCL 4 MG/2ML IJ SOLN
4.0000 mg | Freq: Four times a day (QID) | INTRAMUSCULAR | Status: DC | PRN
  Administered 2023-06-05 – 2023-06-09 (×12): 4 mg via INTRAVENOUS
  Filled 2023-06-05 (×12): qty 2

## 2023-06-05 MED ORDER — DIPHENHYDRAMINE HCL 50 MG/ML IJ SOLN
12.5000 mg | Freq: Four times a day (QID) | INTRAMUSCULAR | Status: DC | PRN
  Administered 2023-06-05 (×3): 12.5 mg via INTRAVENOUS
  Filled 2023-06-05 (×4): qty 1

## 2023-06-05 MED ORDER — ENOXAPARIN SODIUM 40 MG/0.4ML IJ SOSY
40.0000 mg | PREFILLED_SYRINGE | INTRAMUSCULAR | Status: DC
  Administered 2023-06-05 – 2023-06-08 (×4): 40 mg via SUBCUTANEOUS
  Filled 2023-06-05 (×4): qty 0.4

## 2023-06-05 MED ORDER — DIPHENHYDRAMINE HCL 25 MG PO CAPS: 25.0000 mg | ORAL_CAPSULE | ORAL | Status: DC | PRN

## 2023-06-05 MED ORDER — TIZANIDINE HCL 4 MG PO TABS
4.0000 mg | ORAL_TABLET | Freq: Every day | ORAL | Status: DC
  Administered 2023-06-05 – 2023-06-08 (×4): 4 mg via ORAL
  Filled 2023-06-05 (×4): qty 1

## 2023-06-05 MED ORDER — SODIUM CHLORIDE 0.9% FLUSH: 9.0000 mL | INTRAVENOUS | Status: DC | PRN

## 2023-06-05 MED ORDER — KETOROLAC TROMETHAMINE 30 MG/ML IJ SOLN
30.0000 mg | Freq: Four times a day (QID) | INTRAMUSCULAR | Status: DC
  Administered 2023-06-05 (×2): 30 mg via INTRAVENOUS
  Filled 2023-06-05 (×2): qty 1

## 2023-06-05 MED ORDER — PNEUMOCOCCAL 20-VAL CONJ VACC 0.5 ML IM SUSY
0.5000 mL | PREFILLED_SYRINGE | INTRAMUSCULAR | Status: DC
  Filled 2023-06-05: qty 0.5

## 2023-06-05 MED ORDER — POLYETHYLENE GLYCOL 3350 17 G PO PACK: 17.0000 g | PACK | Freq: Every day | ORAL | Status: DC | PRN

## 2023-06-05 MED ORDER — HYDROMORPHONE 1 MG/ML IV SOLN
INTRAVENOUS | Status: DC
  Administered 2023-06-05: 30 mg via INTRAVENOUS
  Administered 2023-06-05: 5 mL via INTRAVENOUS
  Administered 2023-06-05: 3 mL via INTRAVENOUS
  Administered 2023-06-06: 1.5 mg via INTRAVENOUS
  Administered 2023-06-06: 5 mL via INTRAVENOUS
  Administered 2023-06-06: 4.5 mg via INTRAVENOUS
  Administered 2023-06-06: 1 mg via INTRAVENOUS
  Administered 2023-06-06: 5.5 mg via INTRAVENOUS
  Administered 2023-06-07: 3 mg via INTRAVENOUS
  Administered 2023-06-07: 0.5 mg via INTRAVENOUS
  Administered 2023-06-07: 2 mg via INTRAVENOUS
  Administered 2023-06-07: 1 mg via INTRAVENOUS
  Administered 2023-06-07: 30 mL via INTRAVENOUS
  Administered 2023-06-07: 1 mg via INTRAVENOUS
  Administered 2023-06-07: 2.5 mg via INTRAVENOUS
  Administered 2023-06-08: 5.5 mg via INTRAVENOUS
  Administered 2023-06-08: 4 mg via INTRAVENOUS
  Administered 2023-06-08: 5.5 mg via INTRAVENOUS
  Administered 2023-06-08: 0.1 mg via INTRAVENOUS
  Administered 2023-06-08: 3 mg via INTRAVENOUS
  Administered 2023-06-08: 3.5 mg via INTRAVENOUS
  Administered 2023-06-08: 3 mg via INTRAVENOUS
  Administered 2023-06-09 (×2): 0.5 mg via INTRAVENOUS
  Administered 2023-06-09: 2 mg via INTRAVENOUS
  Filled 2023-06-05 (×3): qty 30

## 2023-06-05 MED ORDER — KETOROLAC TROMETHAMINE 15 MG/ML IJ SOLN
15.0000 mg | Freq: Four times a day (QID) | INTRAMUSCULAR | Status: DC
  Administered 2023-06-05 – 2023-06-09 (×16): 15 mg via INTRAVENOUS
  Filled 2023-06-05 (×16): qty 1

## 2023-06-05 MED ORDER — HYDROMORPHONE HCL 2 MG/ML IJ SOLN
2.0000 mg | INTRAMUSCULAR | Status: DC | PRN
  Administered 2023-06-05 (×4): 2 mg via INTRAVENOUS
  Filled 2023-06-05 (×4): qty 1

## 2023-06-05 MED ORDER — DEXTROSE-SODIUM CHLORIDE 5-0.45 % IV SOLN
INTRAVENOUS | Status: AC
  Administered 2023-06-06: 100 mL/h via INTRAVENOUS

## 2023-06-05 MED ORDER — NALOXONE HCL 0.4 MG/ML IJ SOLN
0.4000 mg | INTRAMUSCULAR | Status: DC | PRN
Start: 2023-06-05 — End: 2023-06-09

## 2023-06-05 MED ORDER — SENNOSIDES-DOCUSATE SODIUM 8.6-50 MG PO TABS
1.0000 | ORAL_TABLET | Freq: Two times a day (BID) | ORAL | Status: DC
  Administered 2023-06-05 – 2023-06-09 (×9): 1 via ORAL
  Filled 2023-06-05 (×9): qty 1

## 2023-06-05 NOTE — Plan of Care (Signed)

## 2023-06-05 NOTE — H&P (Signed)
 History and Physical    Patient: [name]. FMW:[id number] DOB: 07/06/1975 DOA: 06/04/2023 DOS: the patient was seen and examined on 06/05/2023 PCP: Shelda Atlas, MD  Patient coming from: Home  Chief Complaint:  Chief Complaint  Patient presents with   Sickle Cell Pain Crisis   HPI: [name]. is a 48 y.o. male with medical history significant of sickle cell disease, chronic pain syndrome, homelessness, avascular necrosis, polysubstance abuse including tobacco abuse who came to the ER complaining of pain in his back legs consistent with his typical sickle cell crisis.  Patient was seen in the ER.  Was treated with multiple doses of IV Dilaudid  but no relief.  He has his home regimen and he took it prior to come to the ER again no relief.  Patient is therefore being admitted to the hospital for sickle cell pain crisis.  He denied any fever or chills no nausea vomiting or diarrhea.  Workup in the ER showed no evidence of acute viral illness including COVID-19.  Patient's vital and H&H appeared to be within his baseline.  He is being admitted to the hospital for further evaluation and treatment.  Review of Systems: As mentioned in the history of present illness. All other systems reviewed and are negative. Past Medical History:  Diagnosis Date   Avascular necrosis of hip (HCC)    bilateral   Avascular necrosis of hip, left (HCC) 08/27/2011   Blood transfusion    Infection of bone, shoulder region [facility])    left shoulder   Pneumonia    Sickle cell crisis [facility])    Past Surgical History:  Procedure Laterality Date   BONE GRAFT HIP ILIAC CREST     JOINT REPLACEMENT  2006   right total hip arthroplasty   Orif right hip fracture  1995   Social History:  reports that he has been smoking cigarettes. He has never used smokeless tobacco. He reports that he does not drink alcohol and does not use drugs.  No Known Allergies  Family History  Adopted: Yes    Prior to Admission  medications   Medication Sig Start Date End Date Taking? Authorizing Provider  Ascorbic Acid (VITAMIN C GUMMIES PO) Take 1 Dose by mouth daily at 6 (six) AM.   Yes [provider]  buPROPion  (ZYBAN ) 150 MG 12 hr tablet Take 150 mg by mouth 2 (two) times daily. 05/04/21  Yes [provider]  diphenhydrAMINE  (BENADRYL ) 25 MG tablet Take 25 mg by mouth every 6 (six) hours as needed for itching.   Yes [provider]  ibuprofen  (ADVIL ) 800 MG tablet Take 1 tablet by mouth 3 (three) times daily as needed.   Yes [provider]  morphine  (MS CONTIN ) 15 MG 12 hr tablet Take 30 mg by mouth 2 (two) times daily. 09/11/20  Yes [provider]  morphine  (MSIR) 30 MG tablet Take 1 tablet (30 mg total) by mouth daily as needed for severe pain (pain). Resume after completing dilaudid . Patient taking differently: Take 30 mg by mouth 2 (two) times daily as needed for severe pain (pain score 7-10) (pain). 02/17/16  Yes Alvia Rosaline LABOR, MD  NARCAN  4 MG/0.1ML LIQD nasal spray kit Place 4 mg into the nose once as needed (overdose). 12/16/17  Yes [provider]  promethazine  (PHENERGAN ) 25 MG tablet Take 25 mg by mouth every 6 (six) hours as needed for nausea.   Yes [provider]  tiZANidine  (ZANAFLEX ) 4 MG tablet  Take 4 mg by mouth at bedtime. 09/05/20  Yes [provider]  VITAMIN D , CHOLECALCIFEROL, PO Take 1 tablet by mouth every other day.   Yes [provider]  amoxicillin -clavulanate (AUGMENTIN ) 875-125 MG tablet Take 1 tablet by mouth 2 (two) times daily. Patient not taking: Reported on 06/05/2023 08/22/22   Malik Savannah, PA-C  naproxen  (NAPROSYN ) 500 MG tablet Take 1 tablet (500 mg total) by mouth 2 (two) times daily with a meal. Patient not taking: Reported on 06/05/2023 08/22/22   Malik Savannah, PA-C    Physical Exam: Vitals:   06/05/23 0618 06/05/23 0701 06/05/23 0730 06/05/23 0830  BP: 110/84  106/83 105/74  Pulse: 68  68 65   Resp: 13  12 16   Temp:  98.3 F (36.8 C)    TempSrc:  Oral    SpO2: 94%   92%  Weight:      Height:       Constitutional: NAD, calm, comfortable Eyes: PERRL, lids and conjunctivae normal ENMT: Mucous membranes are moist. Posterior pharynx clear of any exudate or lesions.Normal dentition.  Neck: normal, supple, no masses, no thyromegaly Respiratory: clear to auscultation bilaterally, no wheezing, no crackles. Normal respiratory effort. No accessory muscle use.  Cardiovascular: Regular rate and rhythm, no murmurs / rubs / gallops. No extremity edema. 2+ pedal pulses. No carotid bruits.  Abdomen: no tenderness, no masses palpated. No hepatosplenomegaly. Bowel sounds positive.  Musculoskeletal: Good range of motion, no joint swelling or tenderness, Skin: no rashes, lesions, ulcers. No induration Neurologic: CN 2-12 grossly intact. Sensation intact, DTR normal. Strength 5/5 in all 4.  Psychiatric: Normal judgment and insight. Alert and oriented x 3. Normal mood  Data Reviewed:  Vital signs appeared to be within normal, total bilirubin 1.4.  White count 11.7 with hemoglobin of 8.7 reticulocyte count percent is 3.6.  Assessment and Plan:  #1 sickle cell pain crisis: Patient will be admitted.  Initiate Dilaudid  PCA, Toradol , IV fluids.  Will have D5 half-normal at 100 an hour.  Continue to reassess pain.  #2 anemia of chronic disease: H&H appears to be within normal.  Continue to monitor  #3 chronic pain syndrome: Confirm continue chronic home regimen  #4 history of polysubstance abuse: Counseled will be provided.   Advance Care Planning:   Code Status: Full Code   Consults: None  Family Communication: No family at bedside  Severity of Illness: The appropriate patient status for this patient is INPATIENT. Inpatient status is judged to be reasonable and necessary in order to provide the required intensity of service to ensure the patient's safety. The patient's presenting symptoms,  physical exam findings, and initial radiographic and laboratory data in the context of their chronic comorbidities is felt to place them at high risk for further clinical deterioration. Furthermore, it is not anticipated that the patient will be medically stable for discharge from the hospital within 2 midnights of admission.   * I certify that at the point of admission it is my clinical judgment that the patient will require inpatient hospital care spanning beyond 2 midnights from the point of admission due to high intensity of service, high risk for further deterioration and high frequency of surveillance required.*  AuthorBETHA SIM KNOLL, MD 06/05/2023 10:24 AM  For on call review [email or web address].

## 2023-06-05 NOTE — ED Notes (Signed)
 Pt ambulated to bathroom

## 2023-06-05 NOTE — ED Provider Notes (Signed)
 Physical Exam  BP 108/72 (BP Location: Left Arm)   Pulse 68   Temp (!) 97.5 F (36.4 C) (Oral)   Resp 18   Ht 6' 2 (1.88 m)   Wt 83.9 kg   SpO2 99%   BMI 23.75 kg/m   Physical Exam Vitals and nursing note reviewed.  HENT:     Head: Normocephalic and atraumatic.     Mouth/Throat:     Mouth: Mucous membranes are moist.     Pharynx: No oropharyngeal exudate or posterior oropharyngeal erythema.  Eyes:     General: No scleral icterus.       Right eye: No discharge.        Left eye: No discharge.     Conjunctiva/sclera: Conjunctivae normal.  Cardiovascular:     Rate and Rhythm: Normal rate and regular rhythm.     Pulses: Normal pulses.     Heart sounds: Normal heart sounds.  Pulmonary:     Effort: Pulmonary effort is normal. No respiratory distress.     Breath sounds: Normal breath sounds. No wheezing or rales.  Abdominal:     General: There is no distension.     Tenderness: There is no abdominal tenderness.  Musculoskeletal:        General: No deformity.     Cervical back: Neck supple.     Right lower leg: No edema.     Left lower leg: No edema.  Skin:    General: Skin is warm and dry.     Capillary Refill: Capillary refill takes less than 2 seconds.  Neurological:     General: No focal deficit present.     Mental Status: He is alert. Mental status is at baseline.  Psychiatric:        Mood and Affect: Mood normal.     Procedures  Procedures  ED Course / MDM   Clinical Course as of 06/05/23 0334  Fri Jun 04, 2023  2310 Patient endorsed some [JR]  Sat Jun 05, 2023  0330 Consult Dr. Laveda, hospitalist, who is agreeable to admitting this patient to her service. I appreciate her collaboration in the care of this patient.  [RS]    Clinical Course User Index [JR] Robinson, John K, PA-C [RS] Cici Rodriges, Pleasant SAUNDERS, PA-C   Medical Decision Making Amount and/or Complexity of Data Reviewed Labs: ordered. Radiology: ordered.  Risk Prescription drug  management. Decision regarding hospitalization.    Care of this patient assumed preceding ED provider Norleen Essex, PA-C demonstrations.  Please anticipate help take patient to court hearing. 48 year old male with SCC, time of shift change pending PE study, primary symptom is rib and low back pain.  Patient is infrequently in the emergency department for sickle cell pain crisis.  CT PE study with mild to moderate severity scarring versus atelectasis within the posterior upper middle right lung and lower lobes no acute infiltrate on CT per radiologist, images were not reviewed by this provider.  Patient reevaluated with persistent pain, no shortness of breath, no chest pain.  Clinical concern for acute chest syndrome is low at this juncture.  Patient will require admission to the hospital however for acute pain crisis persisting with tenderness and pain after 3 doses of IV analgesia.  Consult to hospitalist pending.  Zackory  voiced understanding of his medical evaluation and treatment plan. Each of their questions answered to their expressed satisfaction.  Return precautions were given.He is amenable to plan for admission at this time.   This chart was  dictated using voice recognition software, Dragon. Despite the best efforts of this provider to proofread and correct errors, errors may still occur which can change documentation meaning.       Morissa Obeirne R, PA-C 06/05/23 9665    Palumbo, April, MD 06/05/23 [phone]

## 2023-06-06 DIAGNOSIS — D5700 Hb-SS disease with crisis, unspecified: Secondary | ICD-10-CM | POA: Diagnosis not present

## 2023-06-06 LAB — COMPREHENSIVE METABOLIC PANEL
ALT: 17 U/L (ref 0–44)
AST: 26 U/L (ref 15–41)
Albumin: 3.8 g/dL (ref 3.5–5.0)
Alkaline Phosphatase: 79 U/L (ref 38–126)
Anion gap: 7 (ref 5–15)
BUN: 16 mg/dL (ref 6–20)
CO2: 27 mmol/L (ref 22–32)
Calcium: 8.7 mg/dL — ABNORMAL LOW (ref 8.9–10.3)
Chloride: 106 mmol/L (ref 98–111)
Creatinine, Ser: 1 mg/dL (ref 0.61–1.24)
GFR, Estimated: >60 mL/min (ref >60)
Glucose, Bld: 105 mg/dL — ABNORMAL HIGH (ref 70–99)
Potassium: 4.2 mmol/L (ref 3.5–5.1)
Sodium: 140 mmol/L (ref 135–145)
Total Bilirubin: 1.8 mg/dL — ABNORMAL HIGH (ref 0.0–1.2)
Total Protein: 7.2 g/dL (ref 6.5–8.1)

## 2023-06-06 LAB — CBC WITH DIFFERENTIAL/PLATELET
Abs Immature Granulocytes: 0.08 10*3/uL — ABNORMAL HIGH (ref 0.00–0.07)
Basophils Absolute: 0.1 10*3/uL (ref 0.0–0.1)
Basophils Relative: 1 %
Eosinophils Absolute: 1.1 10*3/uL — ABNORMAL HIGH (ref 0.0–0.5)
Eosinophils Relative: 8 %
HCT: 21.8 % — ABNORMAL LOW (ref 39.0–52.0)
Hemoglobin: 8 g/dL — ABNORMAL LOW (ref 13.0–17.0)
Immature Granulocytes: 1 %
Lymphocytes Relative: 25 %
Lymphs Abs: 3.3 10*3/uL (ref 0.7–4.0)
MCH: 34 pg (ref 26.0–34.0)
MCHC: 36.7 g/dL — ABNORMAL HIGH (ref 30.0–36.0)
MCV: 92.8 fL (ref 80.0–100.0)
Monocytes Absolute: 1.2 10*3/uL — ABNORMAL HIGH (ref 0.1–1.0)
Monocytes Relative: 9 %
Neutro Abs: 7.5 10*3/uL (ref 1.7–7.7)
Neutrophils Relative %: 56 %
Platelets: 247 10*3/uL (ref 150–400)
RBC: 2.35 MIL/uL — ABNORMAL LOW (ref 4.22–5.81)
RDW: 14.6 % (ref 11.5–15.5)
WBC: 13.2 10*3/uL — ABNORMAL HIGH (ref 4.0–10.5)
nRBC: 0.2 % (ref 0.0–0.2)

## 2023-06-06 MED ORDER — LINACLOTIDE 145 MCG PO CAPS
145.0000 ug | ORAL_CAPSULE | Freq: Every day | ORAL | Status: AC
  Administered 2023-06-06: 145 ug via ORAL
  Filled 2023-06-06: qty 1

## 2023-06-06 NOTE — Plan of Care (Signed)
  Problem: Education: Goal: Knowledge of General Education information will improve Description: Including pain rating scale, medication(s)/side effects and non-pharmacologic comfort measures Outcome: Progressing   Problem: Clinical Measurements: Goal: Will remain free from infection Outcome: Progressing Goal: Respiratory complications will improve Outcome: Progressing Goal: Cardiovascular complication will be avoided Outcome: Progressing   Problem: Coping: Goal: Level of anxiety will decrease Outcome: Progressing   Problem: Elimination: Goal: Will not experience complications related to urinary retention Outcome: Progressing   Problem: Education: Goal: Awareness of infection prevention will improve Outcome: Progressing   Problem: Self-Care: Goal: Ability to incorporate actions that prevent/reduce pain crisis will improve Outcome: Progressing

## 2023-06-06 NOTE — Progress Notes (Signed)
 SICKLE CELL SERVICE PROGRESS NOTE  [name] [name]. FMW:[id number] DOB: [birth year] DOA: 06/04/2023 PCP: Shelda Atlas, MD  Assessment/Plan: Principal Problem:   Sickle cell pain crisis (HCC) Active Problems:   Tobacco abuse   Anemia of chronic disease   Chronic pain syndrome  Sickle cell pain crisis: Patient on Dilaudid  PCA, Toradol , IV fluids.  Patient still in pain.  Continue treatment.  They pain is 9 out of 10. Anemia of chronic disease: H&H stable.  Continue to monitor Chronic pain syndrome: Patient maintained on long-acting home medications. Tobacco abuse: Counseling provided  Code Status: Full code Family Communication: No family at bedside Disposition Plan: Home  [facility]  Pager [phone]. If 7PM-7AM, please contact night-coverage.  06/06/2023, 7:24 AM  LOS: 1 day   Brief narrative:  [name]. is a 48 y.o. male with medical history significant of sickle cell disease, chronic pain syndrome, homelessness, avascular necrosis, polysubstance abuse including tobacco abuse who came to the ER complaining of pain in his back legs consistent with his typical sickle cell crisis.  Patient was seen in the ER.  Was treated with multiple doses of IV Dilaudid  but no relief.  He has his home regimen and he took it prior to come to the ER again no relief.  Patient is therefore being admitted to the hospital for sickle cell pain crisis.  He denied any fever or chills no nausea vomiting or diarrhea.  Workup in the ER showed no evidence of acute viral illness including COVID-19.  Patient's vital and H&H appeared to be within his baseline.  He is being admitted to the hospital for further evaluation and treatment.   Consultants: None  Procedures: Chest x-ray  Antibiotics: None  HPI/Subjective: Patient's pain is still at 8 out of 10.  No nausea vomiting or diarrhea.  Pain is in the lower legs.  Objective: Vitals:   06/05/23 2007 06/05/23 2322 06/05/23 2354 06/06/23 0402   BP:  112/71    Pulse:  70    Resp: 17 16 17 16   Temp:  98.1 F (36.7 C)    TempSrc:  Oral    SpO2: 96% 96% 95% 92%  Weight:      Height:       Weight change:   Intake/Output Summary (Last 24 hours) at 06/06/2023 0724 Last data filed at 06/06/2023 0300 Gross per 24 hour  Intake 1342.97 ml  Output 0 ml  Net 1342.97 ml    General: Alert, awake, oriented x3, in no acute distress.  HEENT: [place]/AT PEERL, EOMI Neck: Trachea midline,  no masses, no thyromegal,y no JVD, no carotid bruit OROPHARYNX:  Moist, No exudate/ erythema/lesions.  Heart: Regular rate and rhythm, without murmurs, rubs, gallops, PMI non-displaced, no heaves or thrills on palpation.  Lungs: Clear to auscultation, no wheezing or rhonchi noted. No increased vocal fremitus resonant to percussion  Abdomen: Soft, nontender, nondistended, positive bowel sounds, no masses no hepatosplenomegaly noted..  Neuro: No focal neurological deficits noted cranial nerves II through XII grossly intact. DTRs 2+ bilaterally upper and lower extremities. Strength 5 out of 5 in bilateral upper and lower extremities. Musculoskeletal: No warm swelling or erythema around joints, no spinal tenderness noted. Psychiatric: Patient alert and oriented x3, good insight and cognition, good recent to remote recall. Lymph node survey: No cervical axillary or inguinal lymphadenopathy noted.   Data Reviewed: Basic Metabolic Panel: Recent Labs  Lab 06/04/23 1948 06/05/23 1621  NA 136  --   K 4.1  --  CL 104  --   CO2 23  --   GLUCOSE 84  --   BUN 16  --   CREATININE 0.96 1.03  CALCIUM 8.9  --    Liver Function Tests: Recent Labs  Lab 06/04/23 1948  AST 31  ALT 21  ALKPHOS 81  BILITOT 1.4*  PROT 8.0  ALBUMIN 4.4   No results for input(s): LIPASE, AMYLASE in the last 168 hours. No results for input(s): AMMONIA in the last 168 hours. CBC: Recent Labs  Lab 06/04/23 1948 06/05/23 1621  WBC 11.7* 12.8*  NEUTROABS 6.0  --   HGB  8.7* 8.5*  HCT 24.4* 24.0*  MCV 93.1 92.7  PLT 302 276   Cardiac Enzymes: No results for input(s): CKTOTAL, CKMB, CKMBINDEX, TROPONINI in the last 168 hours. BNP (last 3 results) No results for input(s): BNP in the last 8760 hours.  ProBNP (last 3 results) No results for input(s): PROBNP in the last 8760 hours.  CBG: No results for input(s): GLUCAP in the last 168 hours.  No results found for this or any previous visit (from the past 240 hours).   Studies: CT Angio Chest PE W/Cm &/Or Wo Cm Result Date: 06/05/2023 CLINICAL DATA:  Suspected pulmonary embolism. EXAM: CT ANGIOGRAPHY CHEST WITH CONTRAST TECHNIQUE: Multidetector CT imaging of the chest was performed using the standard protocol during bolus administration of intravenous contrast. Multiplanar CT image reconstructions and MIPs were obtained to evaluate the vascular anatomy. RADIATION DOSE REDUCTION: This exam was performed according to the departmental dose-optimization program which includes automated exposure control, adjustment of the mA and/or kV according to patient size and/or use of iterative reconstruction technique. CONTRAST:  75mL OMNIPAQUE  IOHEXOL  350 MG/ML SOLN COMPARISON:  June 27, 2022 FINDINGS: Cardiovascular: Satisfactory opacification of the pulmonary arteries to the segmental level. No evidence of pulmonary embolism. Normal heart size. No pericardial effusion. Mediastinum/Nodes: There is mild AP window and mild pretracheal lymphadenopathy. Thyroid gland, trachea, and esophagus demonstrate no significant findings. Lungs/Pleura: There is evidence of centrilobular and paraseptal emphysematous lung disease. Mild to moderate severity scarring and/or atelectasis is seen within the posterior aspects of the right upper lobe, right middle lobe and bilateral lower lobes. Stable elevation of the right hemidiaphragm is seen. No pleural effusion or pneumothorax is identified. Upper Abdomen: No acute abnormality.  Musculoskeletal: No chest wall abnormality. No acute or significant osseous findings. Review of the MIP images confirms the above findings. IMPRESSION: 1. No evidence of pulmonary embolism. 2. Centrilobular and paraseptal emphysematous lung disease. 3. Mild to moderate severity scarring and/or atelectasis within the posterior aspects of the right upper lobe, right middle lobe and bilateral lower lobes. 4. Stable elevation of the right hemidiaphragm. Emphysema (ICD10-J43.9). Electronically Signed   By: Suzen Dials M.D.   On: 06/05/2023 00:20   DG Chest 2 View Result Date: 06/04/2023 CLINICAL DATA:  Sickle cell crisis pain involving the ribs and lower back. EXAM: CHEST - 2 VIEW COMPARISON:  June 27, 2022 FINDINGS: The heart size and mediastinal contours are within normal limits. Very mild bibasilar scarring and/or atelectasis is seen. There is no evidence of an acute infiltrate, pleural effusion or pneumothorax. The visualized skeletal structures are unremarkable. IMPRESSION: Very mild bibasilar scarring and/or atelectasis. Electronically Signed   By: Suzen Dials M.D.   On: 06/04/2023 22:14    Scheduled Meds:  enoxaparin  (LOVENOX ) injection  40 mg Subcutaneous Q24H   HYDROmorphone    Intravenous Q4H   ketorolac   15 mg Intravenous Q6H  linaclotide   145 mcg Oral QAC breakfast   morphine   30 mg Oral BID   pneumococcal 20-valent conjugate vaccine  0.5 mL Intramuscular Tomorrow-1000   senna-docusate  1 tablet Oral BID   tiZANidine   4 mg Oral QHS   Continuous Infusions:  dextrose  5 % and 0.45 % NaCl 100 mL/hr (06/06/23 9394)    Principal Problem:   Sickle cell pain crisis (HCC) Active Problems:   Tobacco abuse   Anemia of chronic disease   Chronic pain syndrome

## 2023-06-06 NOTE — Plan of Care (Signed)

## 2023-06-07 DIAGNOSIS — D5700 Hb-SS disease with crisis, unspecified: Secondary | ICD-10-CM | POA: Diagnosis not present

## 2023-06-07 MED ORDER — LACTULOSE 10 GM/15ML PO SOLN
10.0000 g | Freq: Once | ORAL | Status: AC
  Administered 2023-06-07: 10 g via ORAL
  Filled 2023-06-07: qty 15

## 2023-06-07 NOTE — Plan of Care (Signed)
  Problem: Health Behavior/Discharge Planning: Goal: Ability to manage health-related needs will improve Outcome: Progressing   Problem: Clinical Measurements: Goal: Ability to maintain clinical measurements within normal limits will improve Outcome: Progressing Goal: Will remain free from infection Outcome: Progressing Goal: Diagnostic test results will improve Outcome: Progressing Goal: Cardiovascular complication will be avoided Outcome: Progressing   Problem: Activity: Goal: Risk for activity intolerance will decrease Outcome: Progressing   Problem: Nutrition: Goal: Adequate nutrition will be maintained Outcome: Progressing   Problem: Elimination: Goal: Will not experience complications related to bowel motility Outcome: Progressing Goal: Will not experience complications related to urinary retention Outcome: Progressing   Problem: Pain Managment: Goal: General experience of comfort will improve and/or be controlled Outcome: Progressing   Problem: Safety: Goal: Ability to remain free from injury will improve Outcome: Progressing   Problem: Skin Integrity: Goal: Risk for impaired skin integrity will decrease Outcome: Progressing   Problem: Education: Goal: Awareness of infection prevention will improve Outcome: Progressing Goal: Awareness of signs and symptoms of anemia will improve Outcome: Progressing   Problem: Self-Care: Goal: Ability to incorporate actions that prevent/reduce pain crisis will improve Outcome: Progressing   Problem: Bowel/Gastric: Goal: Gut motility will be maintained Outcome: Progressing   Problem: Tissue Perfusion: Goal: Complications related to inadequate tissue perfusion will be avoided or minimized Outcome: Progressing   Problem: Respiratory: Goal: Pulmonary complications will be avoided or minimized Outcome: Progressing   Problem: Fluid Volume: Goal: Ability to maintain a balanced intake and output will improve Outcome:  Progressing   Problem: Sensory: Goal: Pain level will decrease with appropriate interventions Outcome: Progressing   Problem: Health Behavior: Goal: Postive changes in compliance with treatment and prescription regimens will improve Outcome: Progressing

## 2023-06-07 NOTE — Plan of Care (Signed)
  Problem: Clinical Measurements: Goal: Diagnostic test results will improve Outcome: Progressing Goal: Respiratory complications will improve Outcome: Progressing Goal: Cardiovascular complication will be avoided Outcome: Progressing   Problem: Coping: Goal: Level of anxiety will decrease Outcome: Progressing   Problem: Pain Managment: Goal: General experience of comfort will improve and/or be controlled Outcome: Progressing   Problem: Safety: Goal: Ability to remain free from injury will improve Outcome: Progressing   Problem: Self-Care: Goal: Ability to incorporate actions that prevent/reduce pain crisis will improve Outcome: Progressing   Problem: Respiratory: Goal: Pulmonary complications will be avoided or minimized Outcome: Progressing Goal: Acute Chest Syndrome will be identified early to prevent complications Outcome: Progressing

## 2023-06-07 NOTE — Plan of Care (Signed)
  Problem: Education: Goal: Knowledge of General Education information will improve Description: Including pain rating scale, medication(s)/side effects and non-pharmacologic comfort measures Outcome: Progressing   Problem: Health Behavior/Discharge Planning: Goal: Ability to manage health-related needs will improve Outcome: Progressing   Problem: Clinical Measurements: Goal: Ability to maintain clinical measurements within normal limits will improve Outcome: Progressing Goal: Will remain free from infection Outcome: Progressing Goal: Diagnostic test results will improve Outcome: Progressing Goal: Cardiovascular complication will be avoided Outcome: Progressing   Problem: Activity: Goal: Risk for activity intolerance will decrease Outcome: Progressing   Problem: Elimination: Goal: Will not experience complications related to bowel motility Outcome: Progressing   Problem: Pain Managment: Goal: General experience of comfort will improve and/or be controlled Outcome: Progressing   Problem: Safety: Goal: Ability to remain free from injury will improve Outcome: Progressing   Problem: Education: Goal: Awareness of signs and symptoms of anemia will improve Outcome: Progressing   Problem: Respiratory: Goal: Acute Chest Syndrome will be identified early to prevent complications Outcome: Progressing   Problem: Sensory: Goal: Pain level will decrease with appropriate interventions Outcome: Progressing

## 2023-06-07 NOTE — Progress Notes (Signed)
 SICKLE CELL SERVICE PROGRESS NOTE  [name]. ZOX:[id number] DOB: 1976/04/01 DOA: 06/04/2023 PCP: Charle Congo, MD  Assessment/Plan: Principal Problem:   Sickle cell pain crisis (HCC) Active Problems:   Tobacco abuse   Anemia of chronic disease   Chronic pain syndrome  Sickle cell pain crisis: Pain is progressively improving.  Patient on Dilaudid  PCA, Toradol , IV fluids.  Patient still in pain.  Continue treatment.  They pain is 7 out of 10. Anemia of chronic disease: H&H stable.  Continue to monitor Chronic pain syndrome: Patient maintained on long-acting home medications. Tobacco abuse: Counseling provided  Code Status: Full code Family Communication: No family at bedside Disposition Plan: Home  [facility]  Pager [phone]. If 7PM-7AM, please contact night-coverage.  06/07/2023, 9:30 PM  LOS: 2 days   Brief narrative:  Malik Drawhorn. is a 48 y.o. male with medical history significant of sickle cell disease, chronic pain syndrome, homelessness, avascular necrosis, polysubstance abuse including tobacco abuse who came to the ER complaining of pain in his back legs consistent with his typical sickle cell crisis.  Patient was seen in the ER.  Was treated with multiple doses of IV Dilaudid  but no relief.  He has his home regimen and he took it prior to come to the ER again no relief.  Patient is therefore being admitted to the hospital for sickle cell pain crisis.  He denied any fever or chills no nausea vomiting or diarrhea.  Workup in the ER showed no evidence of acute viral illness including COVID-19.  Patient's vital and H&H appeared to be within his baseline.  He is being admitted to the hospital for further evaluation and treatment.   Consultants: None  Procedures: Chest x-ray  Antibiotics: None  HPI/Subjective: Patient's pain is still at 7 out of 10.  No nausea vomiting or diarrhea.  Pain is in the lower legs.  Objective: Vitals:   06/07/23 1759 06/07/23  1816 06/07/23 2009 06/07/23 2128  BP:  (!) 102/57  121/81  Pulse:  64  76  Resp: 13 14 15 18   Temp:  98.6 F (37 C)  98.2 F (36.8 C)  TempSrc:  Oral  Oral  SpO2:  92% 92% 98%  Weight:      Height:       Weight change:   Intake/Output Summary (Last 24 hours) at 06/07/2023 2130 Last data filed at 06/07/2023 1425 Gross per 24 hour  Intake 1031.5 ml  Output 1940 ml  Net -908.5 ml    General: Alert, awake, oriented x3, in no acute distress.  HEENT: [place]/AT PEERL, EOMI Neck: Trachea midline,  no masses, no thyromegal,y no JVD, no carotid bruit OROPHARYNX:  Moist, No exudate/ erythema/lesions.  Heart: Regular rate and rhythm, without murmurs, rubs, gallops, PMI non-displaced, no heaves or thrills on palpation.  Lungs: Clear to auscultation, no wheezing or rhonchi noted. No increased vocal fremitus resonant to percussion  Abdomen: Soft, nontender, nondistended, positive bowel sounds, no masses no hepatosplenomegaly noted..  Neuro: No focal neurological deficits noted cranial nerves II through XII grossly intact. DTRs 2+ bilaterally upper and lower extremities. Strength 5 out of 5 in bilateral upper and lower extremities. Musculoskeletal: No warm swelling or erythema around joints, no spinal tenderness noted. Psychiatric: Patient alert and oriented x3, good insight and cognition, good recent to remote recall. Lymph node survey: No cervical axillary or inguinal lymphadenopathy noted.   Data Reviewed: Basic Metabolic Panel: Recent Labs  Lab 06/04/23 1948 06/05/23 1621 06/06/23 5621  NA 136  --  140  K 4.1  --  4.2  CL 104  --  106  CO2 23  --  27  GLUCOSE 84  --  105*  BUN 16  --  16  CREATININE 0.96 1.03 1.00  CALCIUM 8.9  --  8.7*   Liver Function Tests: Recent Labs  Lab 06/04/23 1948 06/06/23 0709  AST 31 26  ALT 21 17  ALKPHOS 81 79  BILITOT 1.4* 1.8*  PROT 8.0 7.2  ALBUMIN 4.4 3.8   No results for input(s): "LIPASE", "AMYLASE" in the last 168 hours. No results  for input(s): "AMMONIA" in the last 168 hours. CBC: Recent Labs  Lab 06/04/23 1948 06/05/23 1621 06/06/23 0709  WBC 11.7* 12.8* 13.2*  NEUTROABS 6.0  --  7.5  HGB 8.7* 8.5* 8.0*  HCT 24.4* 24.0* 21.8*  MCV 93.1 92.7 92.8  PLT 302 276 247   Cardiac Enzymes: No results for input(s): "CKTOTAL", "CKMB", "CKMBINDEX", "TROPONINI" in the last 168 hours. BNP (last 3 results) No results for input(s): "BNP" in the last 8760 hours.  ProBNP (last 3 results) No results for input(s): "PROBNP" in the last 8760 hours.  CBG: No results for input(s): "GLUCAP" in the last 168 hours.  No results found for this or any previous visit (from the past 240 hours).   Studies: CT Angio Chest PE W/Cm &/Or Wo Cm Result Date: 06/05/2023 CLINICAL DATA:  Suspected pulmonary embolism. EXAM: CT ANGIOGRAPHY CHEST WITH CONTRAST TECHNIQUE: Multidetector CT imaging of the chest was performed using the standard protocol during bolus administration of intravenous contrast. Multiplanar CT image reconstructions and MIPs were obtained to evaluate the vascular anatomy. RADIATION DOSE REDUCTION: This exam was performed according to the departmental dose-optimization program which includes automated exposure control, adjustment of the mA and/or kV according to patient size and/or use of iterative reconstruction technique. CONTRAST:  75mL OMNIPAQUE  IOHEXOL  350 MG/ML SOLN COMPARISON:  June 27, 2022 FINDINGS: Cardiovascular: Satisfactory opacification of the pulmonary arteries to the segmental level. No evidence of pulmonary embolism. Normal heart size. No pericardial effusion. Mediastinum/Nodes: There is mild AP window and mild pretracheal lymphadenopathy. Thyroid gland, trachea, and esophagus demonstrate no significant findings. Lungs/Pleura: There is evidence of centrilobular and paraseptal emphysematous lung disease. Mild to moderate severity scarring and/or atelectasis is seen within the posterior aspects of the right upper lobe,  right middle lobe and bilateral lower lobes. Stable elevation of the right hemidiaphragm is seen. No pleural effusion or pneumothorax is identified. Upper Abdomen: No acute abnormality. Musculoskeletal: No chest wall abnormality. No acute or significant osseous findings. Review of the MIP images confirms the above findings. IMPRESSION: 1. No evidence of pulmonary embolism. 2. Centrilobular and paraseptal emphysematous lung disease. 3. Mild to moderate severity scarring and/or atelectasis within the posterior aspects of the right upper lobe, right middle lobe and bilateral lower lobes. 4. Stable elevation of the right hemidiaphragm. Emphysema (ICD10-J43.9). Electronically Signed   By: Virgle Grime M.D.   On: 06/05/2023 00:20   DG Chest 2 View Result Date: 06/04/2023 CLINICAL DATA:  Sickle cell crisis pain involving the ribs and lower back. EXAM: CHEST - 2 VIEW COMPARISON:  June 27, 2022 FINDINGS: The heart size and mediastinal contours are within normal limits. Very mild bibasilar scarring and/or atelectasis is seen. There is no evidence of an acute infiltrate, pleural effusion or pneumothorax. The visualized skeletal structures are unremarkable. IMPRESSION: Very mild bibasilar scarring and/or atelectasis. Electronically Signed   By: Elizabeth Gulling.D.  On: 06/04/2023 22:14    Scheduled Meds:  enoxaparin  (LOVENOX ) injection  40 mg Subcutaneous Q24H   HYDROmorphone    Intravenous Q4H   ketorolac   15 mg Intravenous Q6H   morphine   30 mg Oral BID   pneumococcal 20-valent conjugate vaccine  0.5 mL Intramuscular Tomorrow-1000   senna-docusate  1 tablet Oral BID   tiZANidine   4 mg Oral QHS   Continuous Infusions:    Principal Problem:   Sickle cell pain crisis (HCC) Active Problems:   Tobacco abuse   Anemia of chronic disease   Chronic pain syndrome

## 2023-06-08 DIAGNOSIS — D5700 Hb-SS disease with crisis, unspecified: Secondary | ICD-10-CM | POA: Diagnosis not present

## 2023-06-08 MED ORDER — SODIUM CHLORIDE 0.9 % IV SOLN
12.5000 mg | Freq: Once | INTRAVENOUS | Status: AC
  Administered 2023-06-08: 12.5 mg via INTRAVENOUS
  Filled 2023-06-08: qty 12.5

## 2023-06-08 MED ORDER — PNEUMOCOCCAL 20-VAL CONJ VACC 0.5 ML IM SUSY: 0.5000 mL | PREFILLED_SYRINGE | INTRAMUSCULAR | Status: DC | PRN

## 2023-06-08 MED ORDER — ACETAMINOPHEN 325 MG PO TABS: 650.0000 mg | ORAL_TABLET | ORAL | Status: DC | PRN

## 2023-06-08 MED ORDER — LACTULOSE 10 GM/15ML PO SOLN
10.0000 g | Freq: Once | ORAL | Status: AC | PRN
  Administered 2023-06-08: 10 g via ORAL
  Filled 2023-06-08: qty 15

## 2023-06-08 NOTE — Plan of Care (Signed)
  Problem: Health Behavior/Discharge Planning: Goal: Ability to manage health-related needs will improve Outcome: Progressing   Problem: Clinical Measurements: Goal: Ability to maintain clinical measurements within normal limits will improve Outcome: Progressing Goal: Will remain free from infection Outcome: Progressing Goal: Diagnostic test results will improve Outcome: Progressing Goal: Cardiovascular complication will be avoided Outcome: Progressing   Problem: Activity: Goal: Risk for activity intolerance will decrease Outcome: Progressing   Problem: Nutrition: Goal: Adequate nutrition will be maintained Outcome: Progressing   Problem: Elimination: Goal: Will not experience complications related to urinary retention Outcome: Progressing   Problem: Pain Managment: Goal: General experience of comfort will improve and/or be controlled Outcome: Progressing   Problem: Safety: Goal: Ability to remain free from injury will improve Outcome: Progressing   Problem: Skin Integrity: Goal: Risk for impaired skin integrity will decrease Outcome: Progressing   Problem: Education: Goal: Awareness of infection prevention will improve Outcome: Progressing Goal: Awareness of signs and symptoms of anemia will improve Outcome: Progressing   Problem: Self-Care: Goal: Ability to incorporate actions that prevent/reduce pain crisis will improve Outcome: Progressing   Problem: Tissue Perfusion: Goal: Complications related to inadequate tissue perfusion will be avoided or minimized Outcome: Progressing   Problem: Respiratory: Goal: Pulmonary complications will be avoided or minimized Outcome: Progressing   Problem: Fluid Volume: Goal: Ability to maintain a balanced intake and output will improve Outcome: Progressing   Problem: Sensory: Goal: Pain level will decrease with appropriate interventions Outcome: Progressing   Problem: Health Behavior: Goal: Postive changes in  compliance with treatment and prescription regimens will improve Outcome: Progressing

## 2023-06-08 NOTE — Progress Notes (Cosign Needed Addendum)
Subjective: [name] is a 48 year old male with a medical history significant for sickle cell disease, chronic pain syndrome, opiate dependence and tolerance, history of polysubstance abuse, and history of anemia of chronic disease was admitted for sickle cell pain crisis. Patient states that pain intensity has improved some overnight.  He rates pain as 7/10 mainly to chest and low back.  He denies any headache, shortness of breath, urinary symptoms, nausea, vomiting, or diarrhea.  Objective:  Vital signs in last 24 hours:  Vitals:   06/08/23 0133 06/08/23 0426 06/08/23 0608 06/08/23 0734  BP: 124/72  (!) 108/59   Pulse: 66  65   Resp: 20 18 20 14   Temp: 98.7 F (37.1 C)  98.7 F (37.1 C)   TempSrc: Oral  Oral   SpO2: 93% 93% 94% 96%  Weight:      Height:        Intake/Output from previous day:   Intake/Output Summary (Last 24 hours) at 06/08/2023 1041 Last data filed at 06/07/2023 2009 Gross per 24 hour  Intake 740 ml  Output 350 ml  Net 390 ml    Physical Exam: General: Alert, awake, oriented x3, in no acute distress.  HEENT: [place]/AT PEERL, EOMI Neck: Trachea midline,  no masses, no thyromegal,y no JVD, no carotid bruit OROPHARYNX:  Moist, No exudate/ erythema/lesions.  Heart: Regular rate and rhythm, without murmurs, rubs, gallops, PMI non-displaced, no heaves or thrills on palpation.  Lungs: Clear to auscultation, no wheezing or rhonchi noted. No increased vocal fremitus resonant to percussion  Abdomen: Soft, nontender, nondistended, positive bowel sounds, no masses no hepatosplenomegaly noted..  Neuro: No focal neurological deficits noted cranial nerves II through XII grossly intact. DTRs 2+ bilaterally upper and lower extremities. Strength 5 out of 5 in bilateral upper and lower extremities. Musculoskeletal: No warm swelling or erythema around joints, no spinal tenderness noted. Psychiatric: Patient alert and oriented x3, good insight and cognition, good recent to  remote recall. Lymph node survey: No cervical axillary or inguinal lymphadenopathy noted.  Lab Results:  Basic Metabolic Panel:    Component Value Date/Time   NA 140 06/06/2023 0709   K 4.2 06/06/2023 0709   CL 106 06/06/2023 0709   CO2 27 06/06/2023 0709   BUN 16 06/06/2023 0709   CREATININE 1.00 06/06/2023 0709   CREATININE 0.93 01/02/2022 0000   GLUCOSE 105 (H) 06/06/2023 0709   CALCIUM 8.7 (L) 06/06/2023 0709   CBC:    Component Value Date/Time   WBC 13.2 (H) 06/06/2023 0709   HGB 8.0 (L) 06/06/2023 0709   HCT 21.8 (L) 06/06/2023 0709   PLT 247 06/06/2023 0709   MCV 92.8 06/06/2023 0709   NEUTROABS 7.5 06/06/2023 0709   LYMPHSABS 3.3 06/06/2023 0709   MONOABS 1.2 (H) 06/06/2023 0709   EOSABS 1.1 (H) 06/06/2023 0709   BASOSABS 0.1 06/06/2023 0709    No results found for this or any previous visit (from the past 240 hours).  Studies/Results: No results found.  Medications: Scheduled Meds:  enoxaparin (LOVENOX) injection  40 mg Subcutaneous Q24H   HYDROmorphone   Intravenous Q4H   ketorolac  15 mg Intravenous Q6H   morphine  30 mg Oral BID   senna-docusate  1 tablet Oral BID   tiZANidine  4 mg Oral QHS   Continuous Infusions: PRN Meds:.diphenhydrAMINE, diphenhydrAMINE, naloxone **AND** sodium chloride flush, ondansetron (ZOFRAN) IV, pneumococcal 20-valent conjugate vaccine, polyethylene glycol  Consultants: none  Procedures: none  Antibiotics: none  Assessment/Plan: Principal Problem:  Sickle cell pain crisis (HCC) Active Problems:   Tobacco abuse   Anemia of chronic disease   Chronic pain syndrome  Sickle cell disease with pain crisis: Pain intensity improving today.  Weaning IV Dilaudid PCA. Continue Toradol 15 mg IV every 6 hours Continue home medications Monitor vital signs very closely, reevaluate pain scale regularly, and supplemental oxygen as needed.  Anemia of chronic disease: Hemoglobin is stable and consistent with patient's  baseline.  There is no clinical indication for blood transfusion at this time.  Monitor closely.  Chronic pain syndrome: Continue home medication regimen  Tobacco dependence: Patient counseled at length on the dangers of smoking especially in the setting of sickle cell disease.   Code Status: Full Code Family Communication: N/A Disposition Plan: Not yet ready for discharge.  Discharge planned for 06/09/2023   Nolon Nations  DNP, APRN, FNP-C [facility] [address] [place], [place] [address] [phone]  If 7PM-7AM, please contact night-coverage.  06/08/2023, 10:41 AM  LOS: 3 days

## 2023-06-08 NOTE — Plan of Care (Signed)
  Problem: Education: Goal: Knowledge of General Education information will improve Description: Including pain rating scale, medication(s)/side effects and non-pharmacologic comfort measures Outcome: Progressing   Problem: Health Behavior/Discharge Planning: Goal: Ability to manage health-related needs will improve Outcome: Progressing   Problem: Clinical Measurements: Goal: Ability to maintain clinical measurements within normal limits will improve Outcome: Progressing Goal: Will remain free from infection Outcome: Progressing Goal: Diagnostic test results will improve Outcome: Progressing Goal: Cardiovascular complication will be avoided Outcome: Progressing   Problem: Activity: Goal: Risk for activity intolerance will decrease Outcome: Progressing   Problem: Nutrition: Goal: Adequate nutrition will be maintained Outcome: Progressing   Problem: Coping: Goal: Level of anxiety will decrease Outcome: Progressing   Problem: Elimination: Goal: Will not experience complications related to bowel motility Outcome: Progressing Goal: Will not experience complications related to urinary retention Outcome: Progressing   Problem: Pain Managment: Goal: General experience of comfort will improve and/or be controlled Outcome: Progressing   Problem: Safety: Goal: Ability to remain free from injury will improve Outcome: Progressing   Problem: Skin Integrity: Goal: Risk for impaired skin integrity will decrease Outcome: Progressing   Problem: Education: Goal: Knowledge of vaso-occlusive preventative measures will improve Outcome: Progressing Goal: Awareness of infection prevention will improve Outcome: Progressing Goal: Awareness of signs and symptoms of anemia will improve Outcome: Progressing Goal: Long-term complications will improve Outcome: Progressing   Problem: Self-Care: Goal: Ability to incorporate actions that prevent/reduce pain crisis will improve Outcome:  Progressing   Problem: Bowel/Gastric: Goal: Gut motility will be maintained Outcome: Progressing   Problem: Tissue Perfusion: Goal: Complications related to inadequate tissue perfusion will be avoided or minimized Outcome: Progressing   Problem: Respiratory: Goal: Pulmonary complications will be avoided or minimized Outcome: Progressing Goal: Acute Chest Syndrome will be identified early to prevent complications Outcome: Progressing   Problem: Fluid Volume: Goal: Ability to maintain a balanced intake and output will improve Outcome: Progressing   Problem: Sensory: Goal: Pain level will decrease with appropriate interventions Outcome: Progressing   Problem: Health Behavior: Goal: Postive changes in compliance with treatment and prescription regimens will improve Outcome: Progressing

## 2023-06-09 DIAGNOSIS — D5700 Hb-SS disease with crisis, unspecified: Secondary | ICD-10-CM | POA: Diagnosis not present

## 2023-06-09 LAB — CBC
HCT: 21.4 % — ABNORMAL LOW (ref 39.0–52.0)
Hemoglobin: 7.5 g/dL — ABNORMAL LOW (ref 13.0–17.0)
MCH: 32.6 pg (ref 26.0–34.0)
MCHC: 35 g/dL (ref 30.0–36.0)
MCV: 93 fL (ref 80.0–100.0)
Platelets: 253 10*3/uL (ref 150–400)
RBC: 2.3 MIL/uL — ABNORMAL LOW (ref 4.22–5.81)
RDW: 15.1 % (ref 11.5–15.5)
WBC: 12.7 10*3/uL — ABNORMAL HIGH (ref 4.0–10.5)
nRBC: 0.2 % (ref 0.0–0.2)

## 2023-06-09 LAB — BASIC METABOLIC PANEL
Anion gap: 7 (ref 5–15)
BUN: 14 mg/dL (ref 6–20)
CO2: 26 mmol/L (ref 22–32)
Calcium: 9 mg/dL (ref 8.9–10.3)
Chloride: 107 mmol/L (ref 98–111)
Creatinine, Ser: 1.06 mg/dL (ref 0.61–1.24)
GFR, Estimated: >60 mL/min (ref >60)
Glucose, Bld: 88 mg/dL (ref 70–99)
Potassium: 4 mmol/L (ref 3.5–5.1)
Sodium: 140 mmol/L (ref 135–145)

## 2023-06-09 NOTE — Plan of Care (Signed)
  Problem: Education: Goal: Knowledge of General Education information will improve Description: Including pain rating scale, medication(s)/side effects and non-pharmacologic comfort measures Outcome: Completed/Met   Problem: Health Behavior/Discharge Planning: Goal: Ability to manage health-related needs will improve Outcome: Completed/Met   Problem: Clinical Measurements: Goal: Ability to maintain clinical measurements within normal limits will improve Outcome: Completed/Met Goal: Will remain free from infection Outcome: Completed/Met Goal: Diagnostic test results will improve Outcome: Completed/Met Goal: Cardiovascular complication will be avoided Outcome: Completed/Met   Problem: Activity: Goal: Risk for activity intolerance will decrease Outcome: Completed/Met   Problem: Nutrition: Goal: Adequate nutrition will be maintained Outcome: Completed/Met   Problem: Coping: Goal: Level of anxiety will decrease Outcome: Completed/Met   Problem: Elimination: Goal: Will not experience complications related to bowel motility Outcome: Completed/Met Goal: Will not experience complications related to urinary retention Outcome: Completed/Met   Problem: Pain Managment: Goal: General experience of comfort will improve and/or be controlled Outcome: Completed/Met   Problem: Safety: Goal: Ability to remain free from injury will improve Outcome: Completed/Met   Problem: Skin Integrity: Goal: Risk for impaired skin integrity will decrease Outcome: Completed/Met   Problem: Education: Goal: Knowledge of vaso-occlusive preventative measures will improve Outcome: Completed/Met Goal: Awareness of infection prevention will improve Outcome: Completed/Met Goal: Awareness of signs and symptoms of anemia will improve Outcome: Completed/Met Goal: Long-term complications will improve Outcome: Completed/Met   Problem: Self-Care: Goal: Ability to incorporate actions that prevent/reduce  pain crisis will improve Outcome: Completed/Met   Problem: Bowel/Gastric: Goal: Gut motility will be maintained Outcome: Completed/Met   Problem: Tissue Perfusion: Goal: Complications related to inadequate tissue perfusion will be avoided or minimized Outcome: Completed/Met   Problem: Respiratory: Goal: Pulmonary complications will be avoided or minimized Outcome: Completed/Met Goal: Acute Chest Syndrome will be identified early to prevent complications Outcome: Completed/Met   Problem: Fluid Volume: Goal: Ability to maintain a balanced intake and output will improve Outcome: Completed/Met   Problem: Sensory: Goal: Pain level will decrease with appropriate interventions Outcome: Completed/Met   Problem: Health Behavior: Goal: Postive changes in compliance with treatment and prescription regimens will improve Outcome: Completed/Met

## 2023-06-09 NOTE — Discharge Summary (Signed)
Physician Discharge Summary   Patient: [name]. MRN: [id number] DOB: 11/02/1975  Admit date:     06/04/2023  Discharge date: 06/09/2023  Discharge Physician: Lonia Blood   PCP: Fleet Contras, MD   Recommendations at discharge:   Patient discharged home to follow-up with PCP.  Treatment was done and pain is down to 4 out of 10  Discharge Diagnoses: Principal Problem:   Sickle cell pain crisis (HCC) Active Problems:   Tobacco abuse   Anemia of chronic disease   Chronic pain syndrome  Resolved Problems:   * No resolved hospital problems. [facility] Course: Patient was treated with IV Dilaudid PCA, Toradol, IV fluids.  Pain has improved from admission 9 out of 10 to 4 out of 10 at discharge.  He also has history of tobacco abuse which was consulted.  Was placed on nicotine patch.  Patient continues to do well at time of discharge.  Patient has no other skin complaint.  He has his home medications and encouraged to take them when he gets home.  Assessment and Plan: No notes have been filed under this hospital service. Service: Hospitalist        Consultants: None Procedures performed: Chest x-ray Disposition: Home Diet recommendation:  Discharge Diet Orders (From admission, onward)     Start     Ordered   06/09/23 0000  Diet - low sodium heart healthy        06/09/23 1216           Regular diet DISCHARGE MEDICATION: Allergies as of 06/09/2023   No Known Allergies      Medication List     TAKE these medications    amoxicillin-clavulanate 875-125 MG tablet Commonly known as: AUGMENTIN Take 1 tablet by mouth 2 (two) times daily.   buPROPion 150 MG 12 hr tablet Commonly known as: ZYBAN Take 150 mg by mouth 2 (two) times daily.   diphenhydrAMINE 25 MG tablet Commonly known as: BENADRYL Take 25 mg by mouth every 6 (six) hours as needed for itching.   ibuprofen 800 MG tablet Commonly known as: ADVIL Take 1 tablet by mouth 3 (three) times daily  as needed.   morphine 30 MG tablet Commonly known as: MSIR Take 1 tablet (30 mg total) by mouth daily as needed for severe pain (pain). Resume after completing dilaudid. What changed:  when to take this additional instructions   morphine 15 MG 12 hr tablet Commonly known as: MS CONTIN Take 30 mg by mouth 2 (two) times daily. What changed: Another medication with the same name was changed. Make sure you understand how and when to take each.   naproxen 500 MG tablet Commonly known as: NAPROSYN Take 1 tablet (500 mg total) by mouth 2 (two) times daily with a meal.   Narcan 4 MG/0.1ML Liqd nasal spray kit Generic drug: naloxone Place 4 mg into the nose once as needed (overdose).   promethazine 25 MG tablet Commonly known as: PHENERGAN Take 25 mg by mouth every 6 (six) hours as needed for nausea.   tiZANidine 4 MG tablet Commonly known as: ZANAFLEX Take 4 mg by mouth at bedtime.   VITAMIN C GUMMIES PO Take 1 Dose by mouth daily at 6 (six) AM.   VITAMIN D (CHOLECALCIFEROL) PO Take 1 tablet by mouth every other day.        Discharge Exam: Filed Weights   06/04/23 1854  Weight: 83.9 kg   Constitutional: NAD, calm, comfortable Eyes: PERRL, lids and  conjunctivae normal ENMT: Mucous membranes are moist. Posterior pharynx clear of any exudate or lesions.Normal dentition.  Neck: normal, supple, no masses, no thyromegaly Respiratory: clear to auscultation bilaterally, no wheezing, no crackles. Normal respiratory effort. No accessory muscle use.  Cardiovascular: Regular rate and rhythm, no murmurs / rubs / gallops. No extremity edema. 2+ pedal pulses. No carotid bruits.  Abdomen: no tenderness, no masses palpated. No hepatosplenomegaly. Bowel sounds positive.  Musculoskeletal: Good range of motion, no joint swelling or tenderness, Skin: no rashes, lesions, ulcers. No induration Neurologic: CN 2-12 grossly intact. Sensation intact, DTR normal. Strength 5/5 in all 4.   Psychiatric: Normal judgment and insight. Alert and oriented x 3. Normal mood   Condition at discharge: good  The results of significant diagnostics from this hospitalization (including imaging, microbiology, ancillary and laboratory) are listed below for reference.   Imaging Studies: CT Angio Chest PE W/Cm &/Or Wo Cm Result Date: 06/05/2023 CLINICAL DATA:  Suspected pulmonary embolism. EXAM: CT ANGIOGRAPHY CHEST WITH CONTRAST TECHNIQUE: Multidetector CT imaging of the chest was performed using the standard protocol during bolus administration of intravenous contrast. Multiplanar CT image reconstructions and MIPs were obtained to evaluate the vascular anatomy. RADIATION DOSE REDUCTION: This exam was performed according to the departmental dose-optimization program which includes automated exposure control, adjustment of the mA and/or kV according to patient size and/or use of iterative reconstruction technique. CONTRAST:  75mL OMNIPAQUE IOHEXOL 350 MG/ML SOLN COMPARISON:  June 27, 2022 FINDINGS: Cardiovascular: Satisfactory opacification of the pulmonary arteries to the segmental level. No evidence of pulmonary embolism. Normal heart size. No pericardial effusion. Mediastinum/Nodes: There is mild AP window and mild pretracheal lymphadenopathy. Thyroid gland, trachea, and esophagus demonstrate no significant findings. Lungs/Pleura: There is evidence of centrilobular and paraseptal emphysematous lung disease. Mild to moderate severity scarring and/or atelectasis is seen within the posterior aspects of the right upper lobe, right middle lobe and bilateral lower lobes. Stable elevation of the right hemidiaphragm is seen. No pleural effusion or pneumothorax is identified. Upper Abdomen: No acute abnormality. Musculoskeletal: No chest wall abnormality. No acute or significant osseous findings. Review of the MIP images confirms the above findings. IMPRESSION: 1. No evidence of pulmonary embolism. 2. Centrilobular  and paraseptal emphysematous lung disease. 3. Mild to moderate severity scarring and/or atelectasis within the posterior aspects of the right upper lobe, right middle lobe and bilateral lower lobes. 4. Stable elevation of the right hemidiaphragm. Emphysema (ICD10-J43.9). Electronically Signed   By: Aram Candela M.D.   On: 06/05/2023 00:20   DG Chest 2 View Result Date: 06/04/2023 CLINICAL DATA:  Sickle cell crisis pain involving the ribs and lower back. EXAM: CHEST - 2 VIEW COMPARISON:  June 27, 2022 FINDINGS: The heart size and mediastinal contours are within normal limits. Very mild bibasilar scarring and/or atelectasis is seen. There is no evidence of an acute infiltrate, pleural effusion or pneumothorax. The visualized skeletal structures are unremarkable. IMPRESSION: Very mild bibasilar scarring and/or atelectasis. Electronically Signed   By: Aram Candela M.D.   On: 06/04/2023 22:14    Microbiology: Results for orders placed or performed during the hospital encounter of 06/27/22  Culture, blood (routine x 2)     Status: None   Collection Time: 06/27/22  7:18 AM   Specimen: BLOOD  Result Value Ref Range Status   Specimen Description   Final    BLOOD BLOOD RIGHT FOREARM Performed at [facility], [address] [place], [place] [address]    Special Requests  Final    BOTTLES DRAWN AEROBIC AND ANAEROBIC Blood Culture adequate volume Performed at [facility], [address] [place], [place] [address]    Culture   Final    NO GROWTH 5 DAYS Performed at [facility], [address], [place], [place] [address]    Report Status 07/02/2022 FINAL  Final  Culture, blood (routine x 2)     Status: None   Collection Time: 06/27/22 10:44 AM   Specimen: BLOOD  Result Value Ref Range Status   Specimen Description   Final    BLOOD BLOOD LEFT ARM AEROBIC BOTTLE ONLY Performed at [facility], [address]  [place], [place] [address]    Special Requests   Final    BOTTLES DRAWN AEROBIC ONLY Blood Culture adequate volume Performed at [facility], [address], [place], [place] [address]    Culture   Final    NO GROWTH 5 DAYS Performed at [facility], [address], [place], [place] [address]    Report Status 07/02/2022 FINAL  Final    Labs: CBC: No results for input(s): "WBC", "NEUTROABS", "HGB", "HCT", "MCV", "PLT" in the last 168 hours.  Basic Metabolic Panel: No results for input(s): "NA", "K", "CL", "CO2", "GLUCOSE", "BUN", "CREATININE", "CALCIUM", "MG", "PHOS" in the last 168 hours.  Liver Function Tests: No results for input(s): "AST", "ALT", "ALKPHOS", "BILITOT", "PROT", "ALBUMIN" in the last 168 hours.  CBG: No results for input(s): "GLUCAP" in the last 168 hours.  Discharge time spent: greater than 30 minutes.  SignedLonia Blood, MD [facility] 06/17/2023

## 2023-06-09 NOTE — Progress Notes (Signed)
Messaged provider  Checking to see if patient can dc PCA has finished and didn't want to start another one if he will be going soon can manage pain with prn until then thanks  Awaiting response

## 2023-06-09 NOTE — Progress Notes (Signed)
Patient educated about to return to hospital via teach back method, verbalized s/s of impending crisis and home interventions as well as when to call MD for concerns, reports feeling better, pain level 5/10 which is "about as good as it gets" per patient, checked closets and outlets to ensure he had all belongings, and bags, escorted via transport chair to lobby, ride at door waiting, patient in no acute distress upon discharge

## 2023-06-17 NOTE — Hospital Course (Signed)
Patient was treated with IV Dilaudid PCA, Toradol, IV fluids.  Pain has improved from admission 9 out of 10 to 4 out of 10 at discharge.  He also has history of tobacco abuse which was consulted.  Was placed on nicotine patch.  Patient continues to do well at time of discharge.  Patient has no other skin complaint.  He has his home medications and encouraged to take them when he gets home.

## 2024-01-13 ENCOUNTER — Inpatient Hospital Stay (HOSPITAL_COMMUNITY)
Admission: EM | Admit: 2024-01-13 | Discharge: 2024-01-19 | DRG: 812 | Disposition: A | Discharge status: Home / Self Care | Attending: Internal Medicine | Admitting: Internal Medicine

## 2024-01-13 ENCOUNTER — Encounter (HOSPITAL_COMMUNITY): Payer: Self-pay | Admitting: Emergency Medicine

## 2024-01-13 ENCOUNTER — Other Ambulatory Visit: Payer: Self-pay

## 2024-01-13 DIAGNOSIS — Z720 Tobacco use: Secondary | ICD-10-CM | POA: Diagnosis present

## 2024-01-13 DIAGNOSIS — F17210 Nicotine dependence, cigarettes, uncomplicated: Secondary | ICD-10-CM | POA: Diagnosis present

## 2024-01-13 DIAGNOSIS — M87851 Other osteonecrosis, right femur: Secondary | ICD-10-CM | POA: Diagnosis present

## 2024-01-13 DIAGNOSIS — M87852 Other osteonecrosis, left femur: Secondary | ICD-10-CM | POA: Diagnosis present

## 2024-01-13 DIAGNOSIS — G894 Chronic pain syndrome: Secondary | ICD-10-CM | POA: Diagnosis present

## 2024-01-13 DIAGNOSIS — D72829 Elevated white blood cell count, unspecified: Secondary | ICD-10-CM | POA: Diagnosis present

## 2024-01-13 DIAGNOSIS — K5900 Constipation, unspecified: Secondary | ICD-10-CM | POA: Diagnosis present

## 2024-01-13 DIAGNOSIS — Z79899 Other long term (current) drug therapy: Secondary | ICD-10-CM

## 2024-01-13 DIAGNOSIS — D5700 Hb-SS disease with crisis, unspecified: Principal | ICD-10-CM | POA: Diagnosis present

## 2024-01-13 DIAGNOSIS — Z96641 Presence of right artificial hip joint: Secondary | ICD-10-CM | POA: Diagnosis present

## 2024-01-13 DIAGNOSIS — G8929 Other chronic pain: Secondary | ICD-10-CM

## 2024-01-13 DIAGNOSIS — D638 Anemia in other chronic diseases classified elsewhere: Secondary | ICD-10-CM | POA: Diagnosis present

## 2024-01-13 LAB — CBC WITH DIFFERENTIAL/PLATELET
Abs Immature Granulocytes: 0.08 K/uL — ABNORMAL HIGH (ref 0.00–0.07)
Basophils Absolute: 0.2 K/uL — ABNORMAL HIGH (ref 0.0–0.1)
Basophils Relative: 1 %
Eosinophils Absolute: 0.8 K/uL — ABNORMAL HIGH (ref 0.0–0.5)
Eosinophils Relative: 7 %
HCT: 25.8 % — ABNORMAL LOW (ref 39.0–52.0)
Hemoglobin: 9 g/dL — ABNORMAL LOW (ref 13.0–17.0)
Immature Granulocytes: 1 %
Lymphocytes Relative: 24 %
Lymphs Abs: 2.9 K/uL (ref 0.7–4.0)
MCH: 32.1 pg (ref 26.0–34.0)
MCHC: 34.9 g/dL (ref 30.0–36.0)
MCV: 92.1 fL (ref 80.0–100.0)
Monocytes Absolute: 1.2 K/uL — ABNORMAL HIGH (ref 0.1–1.0)
Monocytes Relative: 10 %
Neutro Abs: 6.6 K/uL (ref 1.7–7.7)
Neutrophils Relative %: 57 %
Platelets: 340 K/uL (ref 150–400)
RBC: 2.8 MIL/uL — ABNORMAL LOW (ref 4.22–5.81)
RDW: 13.9 % (ref 11.5–15.5)
WBC: 11.7 K/uL — ABNORMAL HIGH (ref 4.0–10.5)
nRBC: 0.2 % (ref 0.0–0.2)

## 2024-01-13 LAB — COMPREHENSIVE METABOLIC PANEL WITH GFR
ALT: 12 U/L (ref 0–44)
AST: 34 U/L (ref 15–41)
Albumin: 4.4 g/dL (ref 3.5–5.0)
Alkaline Phosphatase: 97 U/L (ref 38–126)
Anion gap: 14 (ref 5–15)
BUN: 13 mg/dL (ref 6–20)
CO2: 20 mmol/L — ABNORMAL LOW (ref 22–32)
Calcium: 9.6 mg/dL (ref 8.9–10.3)
Chloride: 105 mmol/L (ref 98–111)
Creatinine, Ser: 1 mg/dL (ref 0.61–1.24)
GFR, Estimated: >60 mL/min (ref >60)
Glucose, Bld: 99 mg/dL (ref 70–99)
Potassium: 4.4 mmol/L (ref 3.5–5.1)
Sodium: 139 mmol/L (ref 135–145)
Total Bilirubin: 1.3 mg/dL — ABNORMAL HIGH (ref 0.0–1.2)
Total Protein: 7.9 g/dL (ref 6.5–8.1)

## 2024-01-13 LAB — RETICULOCYTES
Immature Retic Fract: 27.7 % — ABNORMAL HIGH (ref 2.3–15.9)
RBC.: 2.73 MIL/uL — ABNORMAL LOW (ref 4.22–5.81)
Retic Count, Absolute: 74.3 K/uL (ref 19.0–186.0)
Retic Ct Pct: 2.7 % (ref 0.4–3.1)

## 2024-01-13 MED ORDER — HYDROMORPHONE 1 MG/ML IV SOLN
INTRAVENOUS | Status: DC
  Administered 2024-01-13: 30 mg via INTRAVENOUS
  Administered 2024-01-14: 7 mg via INTRAVENOUS
  Administered 2024-01-14: 8 mg via INTRAVENOUS
  Filled 2024-01-13: qty 30

## 2024-01-13 MED ORDER — DIPHENHYDRAMINE HCL 25 MG PO CAPS
25.0000 mg | ORAL_CAPSULE | ORAL | Status: DC | PRN
  Administered 2024-01-13: 50 mg via ORAL
  Administered 2024-01-14: 25 mg via ORAL
  Filled 2024-01-13: qty 1
  Filled 2024-01-13: qty 2

## 2024-01-13 MED ORDER — HYDROMORPHONE HCL 1 MG/ML IJ SOLN: 2.0000 mg | INTRAMUSCULAR | Status: DC | PRN

## 2024-01-13 MED ORDER — NALOXONE HCL 0.4 MG/ML IJ SOLN: 0.4000 mg | INTRAMUSCULAR | Status: DC | PRN

## 2024-01-13 MED ORDER — ONDANSETRON HCL 4 MG/2ML IJ SOLN
4.0000 mg | Freq: Four times a day (QID) | INTRAMUSCULAR | Status: DC | PRN
  Administered 2024-01-14 – 2024-01-18 (×15): 4 mg via INTRAVENOUS
  Filled 2024-01-13 (×16): qty 2

## 2024-01-13 MED ORDER — TIZANIDINE HCL 4 MG PO TABS
4.0000 mg | ORAL_TABLET | Freq: Every day | ORAL | Status: DC
  Administered 2024-01-13 – 2024-01-18 (×6): 4 mg via ORAL
  Filled 2024-01-13 (×6): qty 1

## 2024-01-13 MED ORDER — ONDANSETRON 4 MG PO TBDP
8.0000 mg | ORAL_TABLET | Freq: Three times a day (TID) | ORAL | Status: DC | PRN
  Filled 2024-01-13: qty 1

## 2024-01-13 MED ORDER — HYDROMORPHONE HCL 1 MG/ML IJ SOLN
2.0000 mg | INTRAMUSCULAR | Status: AC
  Administered 2024-01-13: 2 mg via INTRAVENOUS
  Filled 2024-01-13: qty 2

## 2024-01-13 MED ORDER — HYDROMORPHONE HCL 1 MG/ML IJ SOLN: 2.0000 mg | INTRAMUSCULAR | Status: DC

## 2024-01-13 MED ORDER — MORPHINE SULFATE ER 15 MG PO TBCR
15.0000 mg | EXTENDED_RELEASE_TABLET | Freq: Three times a day (TID) | ORAL | Status: DC
  Administered 2024-01-14 – 2024-01-19 (×17): 15 mg via ORAL
  Filled 2024-01-13 (×17): qty 1

## 2024-01-13 MED ORDER — SODIUM CHLORIDE 0.9% FLUSH: 9.0000 mL | INTRAVENOUS | Status: DC | PRN

## 2024-01-13 MED ORDER — POLYETHYLENE GLYCOL 3350 17 G PO PACK
17.0000 g | PACK | Freq: Every day | ORAL | Status: DC | PRN
  Administered 2024-01-13: 17 g via ORAL
  Filled 2024-01-13: qty 1

## 2024-01-13 MED ORDER — BUPROPION HCL ER (SR) 150 MG PO TB12
150.0000 mg | ORAL_TABLET | Freq: Two times a day (BID) | ORAL | Status: DC
  Administered 2024-01-13 – 2024-01-19 (×12): 150 mg via ORAL
  Filled 2024-01-13 (×12): qty 1

## 2024-01-13 MED ORDER — GLYCERIN (LAXATIVE) 2 G RE SUPP: 1.0000 | Freq: Every day | RECTAL | Status: DC | PRN

## 2024-01-13 MED ORDER — KETOROLAC TROMETHAMINE 15 MG/ML IJ SOLN
15.0000 mg | INTRAMUSCULAR | Status: AC
  Administered 2024-01-13: 15 mg via INTRAVENOUS
  Filled 2024-01-13: qty 1

## 2024-01-13 MED ORDER — LINACLOTIDE 145 MCG PO CAPS
145.0000 ug | ORAL_CAPSULE | ORAL | Status: DC
  Filled 2024-01-13: qty 1

## 2024-01-13 MED ORDER — LINACLOTIDE 145 MCG PO CAPS
145.0000 ug | ORAL_CAPSULE | Freq: Every day | ORAL | Status: DC
  Administered 2024-01-14 – 2024-01-19 (×6): 145 ug via ORAL
  Filled 2024-01-13 (×6): qty 1

## 2024-01-13 MED ORDER — DEXTROSE-SODIUM CHLORIDE 5-0.45 % IV SOLN: INTRAVENOUS | Status: DC

## 2024-01-13 MED ORDER — ENOXAPARIN SODIUM 40 MG/0.4ML IJ SOSY
40.0000 mg | PREFILLED_SYRINGE | INTRAMUSCULAR | Status: DC
  Administered 2024-01-14 – 2024-01-19 (×6): 40 mg via SUBCUTANEOUS
  Filled 2024-01-13 (×6): qty 0.4

## 2024-01-13 MED ORDER — SENNOSIDES-DOCUSATE SODIUM 8.6-50 MG PO TABS
1.0000 | ORAL_TABLET | Freq: Two times a day (BID) | ORAL | Status: DC
  Administered 2024-01-13: 1 via ORAL
  Filled 2024-01-13: qty 1

## 2024-01-13 MED ORDER — KETOROLAC TROMETHAMINE 30 MG/ML IJ SOLN
15.0000 mg | Freq: Four times a day (QID) | INTRAMUSCULAR | Status: AC
  Administered 2024-01-13 – 2024-01-18 (×20): 15 mg via INTRAVENOUS
  Filled 2024-01-13 (×22): qty 1

## 2024-01-13 MED ORDER — SODIUM CHLORIDE 0.45 % IV SOLN: INTRAVENOUS | Status: DC

## 2024-01-13 NOTE — H&P (Signed)
 History and Physical    Patient: [name]. FMW:[id number] DOB: 10/18/1975 DOA: 01/13/2024 DOS: the patient was seen and examined on 01/13/2024 PCP: Shelda Atlas, MD  Patient coming from: Home  Chief Complaint:  Chief Complaint  Patient presents with   Sickle Cell Pain Crisis   HPI: [name]. is a 48 y.o. male with medical history significant sickle cell anemia who presents with pain in both shoulders.  The patient has had increased pain for over a week but has used his usual techniques of hot baths and pushing fluids and his as needed narcotic pain medication to try to stave it off at home.  Eventually he felt that everything he can do at home was just not working so he presented to the ED.  He denies any chest pain he denies any shortness of breath he denies any fever or diarrhea or cold symptoms.  His last hospitalization for sickle cell crisis was 7 months ago. In the emergency department he received several doses of IV Dilaudid  and standard care and was not feeling significantly better so he will be admitted to the sickle cell service. Usually his crises involve extra pain in his ribs and back he says it is a little unusual for the pain to be so severe in his shoulders.    Review of Systems: As mentioned in the history of present illness. All other systems reviewed and are negative. Past Medical History:  Diagnosis Date   Avascular necrosis of hip (HCC)    bilateral   Avascular necrosis of hip, left (HCC) 08/27/2011   Blood transfusion    Infection of bone, shoulder region [facility])    left shoulder   Pneumonia    Sickle cell crisis [facility])    Past Surgical History:  Procedure Laterality Date   BONE GRAFT HIP ILIAC CREST     JOINT REPLACEMENT  2006   right total hip arthroplasty   Orif right hip fracture  1995   Social History:  reports that he has been smoking cigarettes. He has never used smokeless tobacco. He reports that he does not drink alcohol and does not  use drugs.  No Known Allergies  Family History  Adopted: Yes    Prior to Admission medications   Medication Sig Start Date End Date Taking? Authorizing Provider  Ascorbic Acid (VITAMIN C GUMMIES PO) Take 1 tablet by mouth See admin instructions. Chew 1 gummie by mouth once a day   Yes [provider]  buPROPion  (ZYBAN ) 150 MG 12 hr tablet Take 150 mg by mouth 2 (two) times daily. 05/04/21  Yes [provider]  Cholecalciferol (VITAMIN D3 PO) Take 1 capsule by mouth daily.   Yes [provider]  diphenhydrAMINE  (BENADRYL ) 25 MG tablet Take 25 mg by mouth every 6 (six) hours as needed for itching.   Yes [provider]  ibuprofen  (ADVIL ) 800 MG tablet Take 800 mg by mouth 3 (three) times daily as needed for mild pain (pain score 1-3).   Yes [provider]  LINZESS  145 MCG CAPS capsule Take 145 mcg by mouth every other day.   Yes [provider]  morphine  (MS CONTIN ) 15 MG 12 hr tablet Take 15 mg by mouth every 8 (eight) hours. 09/11/20  Yes [provider]  morphine  (MSIR) 30 MG tablet Take 1 tablet (30 mg total) by mouth daily as needed for severe pain (pain). Resume after completing dilaudid . Patient taking differently: Take 30 mg by mouth  2 (two) times daily. 02/17/16  Yes Alvia Rosaline LABOR, MD  NARCAN  4 MG/0.1ML LIQD nasal spray kit Place 4 mg into the nose once as needed (for an accidental overdose). 12/16/17  Yes [provider]  NON FORMULARY Place 3 drops under the tongue See admin instructions. Carlyle Lugols Iodine 2 Percent Potassium Iodide and Iodine Solution 2% Liquid Drops - Place 3 drops under the tongue once a day   Yes [provider]  promethazine  (PHENERGAN ) 25 MG tablet Take 25 mg by mouth See admin instructions. Take 25 mg by mouth with every dose of morphine  that is taken   Yes [provider]  tiZANidine  (ZANAFLEX ) 4 MG tablet Take 4 mg by mouth at bedtime. 09/05/20  Yes [provider]  amoxicillin -clavulanate (AUGMENTIN ) 875-125 MG tablet Take 1 tablet by mouth 2 (two) times daily. Patient not taking: Reported on 01/13/2024 08/22/22   Christopher Savannah, PA-C  naproxen  (NAPROSYN ) 500 MG tablet Take 1 tablet (500 mg total) by mouth 2 (two) times daily with a meal. Patient not taking: Reported on 01/13/2024 08/22/22   Christopher Savannah, PA-C    Physical Exam: Vitals:   01/13/24 1924 01/13/24 2246 01/13/24 2250 01/13/24 2314  BP: 117/75 112/86 112/86   Pulse: 100 60 60   Resp: 18 17 17 16   Temp: 98 F (36.7 C) 97.8 F (36.6 C) 97.8 F (36.6 C)   TempSrc:   Oral   SpO2: 97% 94%    Weight:   79.4 kg   Height:   6' 2 (1.88 m)    Physical Exam:  General: No acute distress, well developed, well nourished HEENT: Normocephalic, atraumatic, PERRL Cardiovascular: Normal rate and rhythm. Distal pulses intact. Pulmonary: Normal pulmonary effort, normal breath sounds Gastrointestinal: Nondistended abdomen, soft, non-tender, normoactive bowel sounds Musculoskeletal:Normal ROM, no lower ext edema Lymphadenopathy: No cervical LAD. Skin: Skin is warm and dry. Neuro: No focal deficits noted, AAOx3. PSYCH: Attentive and cooperative  Data Reviewed:  Results for orders placed or performed during the hospital encounter of 01/13/24 (from the past 24 hours)  Comprehensive metabolic panel     Status: Abnormal   Collection Time: 01/13/24  5:11 PM  Result Value Ref Range   Sodium 139 135 - 145 mmol/L   Potassium 4.4 3.5 - 5.1 mmol/L   Chloride 105 98 - 111 mmol/L   CO2 20 (L) 22 - 32 mmol/L   Glucose, Bld 99 70 - 99 mg/dL   BUN 13 6 - 20 mg/dL   Creatinine, Ser 8.99 0.61 - 1.24 mg/dL   Calcium 9.6 8.9 - 89.6 mg/dL   Total Protein 7.9 6.5 - 8.1 g/dL   Albumin 4.4 3.5 - 5.0 g/dL   AST 34 15 - 41 U/L   ALT 12 0 - 44 U/L   Alkaline Phosphatase 97 38 - 126 U/L   Total Bilirubin 1.3 (H) 0.0 - 1.2 mg/dL   GFR, Estimated >39 >39 mL/min   Anion gap 14 5 - 15  CBC with  Differential     Status: Abnormal   Collection Time: 01/13/24  5:11 PM  Result Value Ref Range   WBC 11.7 (H) 4.0 - 10.5 K/uL   RBC 2.80 (L) 4.22 - 5.81 MIL/uL   Hemoglobin 9.0 (L) 13.0 - 17.0 g/dL   HCT 74.1 (L) 60.9 - 47.9 %   MCV 92.1 80.0 - 100.0 fL   MCH 32.1 26.0 - 34.0 pg   MCHC 34.9 30.0 - 36.0 g/dL   RDW  13.9 11.5 - 15.5 %   Platelets 340 150 - 400 K/uL   nRBC 0.2 0.0 - 0.2 %   Neutrophils Relative % 57 %   Neutro Abs 6.6 1.7 - 7.7 K/uL   Lymphocytes Relative 24 %   Lymphs Abs 2.9 0.7 - 4.0 K/uL   Monocytes Relative 10 %   Monocytes Absolute 1.2 (H) 0.1 - 1.0 K/uL   Eosinophils Relative 7 %   Eosinophils Absolute 0.8 (H) 0.0 - 0.5 K/uL   Basophils Relative 1 %   Basophils Absolute 0.2 (H) 0.0 - 0.1 K/uL   Immature Granulocytes 1 %   Abs Immature Granulocytes 0.08 (H) 0.00 - 0.07 K/uL  Reticulocytes     Status: Abnormal   Collection Time: 01/13/24  5:11 PM  Result Value Ref Range   Retic Ct Pct 2.7 0.4 - 3.1 %   RBC. 2.73 (L) 4.22 - 5.81 MIL/uL   Retic Count, Absolute 74.3 19.0 - 186.0 K/uL   Immature Retic Fract 27.7 (H) 2.3 - 15.9 %     Assessment and Plan: Sickle cell crisis - Dilaudid  PCA, scheduled nsaids - Will continue his outpatient MS Contin  if possible - IV fluids per protocol - Consider CT of shoulders to rule out AVN as this is not his usual sickle cell pain - The patient tells me he takes standard sickle cell medications but I do not see folic acid  or hydroxyurea  on his med list.  This needs to be verified.  2.  Constipation - the patient takes Linzess  but still feels constipated despite this.   - Will increase it to daily.    Advance Care Planning:   Code Status: Full Code the patient wants to be full code and names his father as a Runner, broadcasting/film/video.  Consults:  None Family Communication: None  Severity of Illness: The appropriate patient status for this patient is INPATIENT. Inpatient status is judged to be reasonable and necessary  in order to provide the required intensity of service to ensure the patient's safety. The patient's presenting symptoms, physical exam findings, and initial radiographic and laboratory data in the context of their chronic comorbidities is felt to place them at high risk for further clinical deterioration. Furthermore, it is not anticipated that the patient will be medically stable for discharge from the hospital within 2 midnights of admission.   * I certify that at the point of admission it is my clinical judgment that the patient will require inpatient hospital care spanning beyond 2 midnights from the point of admission due to high intensity of service, high risk for further deterioration and high frequency of surveillance required.*  Author: ARTHEA CHILD, MD 01/13/2024 11:35 PM  For on call review [email or web address].

## 2024-01-13 NOTE — ED Provider Notes (Signed)
 [place] EMERGENCY DEPARTMENT AT [facility] Provider Note   CSN: [id number] Arrival date & time: 01/13/24  1552     Patient presents with: Sickle Cell Pain Crisis   Malik Marland. is a 48 y.o. male.   The history is provided by the patient and medical records. No language interpreter was used.  Sickle Cell Pain Crisis    48 year old male with significant history of sickle cell anemia, chronic pain syndrome, presenting with complaint of sickle cell related pain.  Patient endorsed having pain across his upper back and shoulder blades ongoing for a little over a week.  Pain felt similar to prior sickle cell crisis but now not adequately controlled despite taking his home medications.  He also felt muscle twitching as the left side of his face.  He does not endorse any fever or chills no chest pain or shortness of breath no productive cough no nausea vomiting or diarrhea no abdominal pain.  He believes it could be temperature changes that may have triggered his crisis.  Prior to Admission medications   Medication Sig Start Date End Date Taking? Authorizing Provider  amoxicillin -clavulanate (AUGMENTIN ) 875-125 MG tablet Take 1 tablet by mouth 2 (two) times daily. Patient not taking: Reported on 06/05/2023 08/22/22   Christopher Savannah, PA-C  Ascorbic Acid (VITAMIN C GUMMIES PO) Take 1 Dose by mouth daily at 6 (six) AM.    [provider]  buPROPion  (ZYBAN ) 150 MG 12 hr tablet Take 150 mg by mouth 2 (two) times daily. 05/04/21   [provider]  diphenhydrAMINE  (BENADRYL ) 25 MG tablet Take 25 mg by mouth every 6 (six) hours as needed for itching.    [provider]  ibuprofen  (ADVIL ) 800 MG tablet Take 1 tablet by mouth 3 (three) times daily as needed.    [provider]  morphine  (MS CONTIN ) 15 MG 12 hr tablet Take 30 mg by mouth 2 (two) times daily. 09/11/20   [provider]  morphine  (MSIR) 30 MG tablet Take 1 tablet (30 mg total) by mouth  daily as needed for severe pain (pain). Resume after completing dilaudid . Patient taking differently: Take 30 mg by mouth 2 (two) times daily as needed for severe pain (pain score 7-10) (pain). 02/17/16   Alvia Rosaline LABOR, MD  naproxen  (NAPROSYN ) 500 MG tablet Take 1 tablet (500 mg total) by mouth 2 (two) times daily with a meal. Patient not taking: Reported on 06/05/2023 08/22/22   Christopher Savannah, PA-C  NARCAN  4 MG/0.1ML LIQD nasal spray kit Place 4 mg into the nose once as needed (overdose). 12/16/17   [provider]  promethazine  (PHENERGAN ) 25 MG tablet Take 25 mg by mouth every 6 (six) hours as needed for nausea.    [provider]  tiZANidine  (ZANAFLEX ) 4 MG tablet Take 4 mg by mouth at bedtime. 09/05/20   [provider]  VITAMIN D , CHOLECALCIFEROL, PO Take 1 tablet by mouth every other day.    [provider]    Allergies: Patient has no known allergies.    Review of Systems  All other systems reviewed and are negative.   Updated Vital Signs BP 138/82 (BP Location: Right Arm)   Pulse (!) 102   Temp 98 F (36.7 C) (Oral)   Resp 16   SpO2 96%   Physical Exam Constitutional:      General: He is not in acute distress.    Appearance: He is well-developed.  HENT:  Head: Atraumatic.  Eyes:     Conjunctiva/sclera: Conjunctivae normal.  Cardiovascular:     Rate and Rhythm: Normal rate and regular rhythm.     Pulses: Normal pulses.     Heart sounds: Normal heart sounds.  Pulmonary:     Effort: Pulmonary effort is normal.     Breath sounds: Normal breath sounds. No wheezing, rhonchi or rales.  Abdominal:     Palpations: Abdomen is soft.     Tenderness: There is no abdominal tenderness.  Musculoskeletal:        General: Normal range of motion.     Cervical back: Normal range of motion and neck supple. No rigidity or tenderness.  Skin:    Findings: No rash.  Neurological:     Mental Status: He is alert. Mental status is at baseline.      (all labs ordered are listed, but only abnormal results are displayed) Labs Reviewed  COMPREHENSIVE METABOLIC PANEL WITH GFR - Abnormal; Notable for the following components:      Result Value   CO2 20 (*)    Total Bilirubin 1.3 (*)    All other components within normal limits  CBC WITH DIFFERENTIAL/PLATELET - Abnormal; Notable for the following components:   WBC 11.7 (*)    RBC 2.80 (*)    Hemoglobin 9.0 (*)    HCT 25.8 (*)    Monocytes Absolute 1.2 (*)    Eosinophils Absolute 0.8 (*)    Basophils Absolute 0.2 (*)    Abs Immature Granulocytes 0.08 (*)    All other components within normal limits  RETICULOCYTES - Abnormal; Notable for the following components:   RBC. 2.73 (*)    Immature Retic Fract 27.7 (*)    All other components within normal limits    EKG: None  Radiology: No results found.   Procedures   Medications Ordered in the ED  dextrose  5 % and 0.45 % NaCl infusion ( Intravenous New Bag/Given 01/13/24 1954)  diphenhydrAMINE  (BENADRYL ) capsule 25-50 mg (50 mg Oral Given 01/13/24 1908)  ondansetron  (ZOFRAN -ODT) disintegrating tablet 8 mg (has no administration in time range)  HYDROmorphone  (DILAUDID ) injection 2 mg (has no administration in time range)  ketorolac  (TORADOL ) 15 MG/ML injection 15 mg (15 mg Intravenous Given 01/13/24 1909)  HYDROmorphone  (DILAUDID ) injection 2 mg (2 mg Intravenous Given 01/13/24 1909)  HYDROmorphone  (DILAUDID ) injection 2 mg (2 mg Intravenous Given 01/13/24 1948)  HYDROmorphone  (DILAUDID ) injection 2 mg (2 mg Intravenous Given 01/13/24 2045)                                    Medical Decision Making Amount and/or Complexity of Data Reviewed Labs: ordered.  Risk Prescription drug management.   BP 138/82 (BP Location: Right Arm)   Pulse (!) 102   Temp 98 F (36.7 C) (Oral)   Resp 16   SpO2 96%   70:96 PM  48 year old male with significant history of sickle cell anemia, chronic pain syndrome, presenting with  complaint of sickle cell related pain.  Patient endorsed having pain across his upper back and shoulder blades ongoing for a little over a week.  Pain felt similar to prior sickle cell crisis but now not adequately controlled despite taking his home medications.  He also felt muscle twitching as the left side of his face.  He does not endorse any fever or chills no chest pain or shortness of breath no productive  cough no nausea vomiting or diarrhea no abdominal pain.  He believes it could be temperature changes that may have triggered his crisis.  On exam patient is laying bed appears slightly uncomfortable.  No significant reproducible tenderness to his upper back or shoulder blades.  He does have some  muscle fasciculation to the left side of his face without any facial weakness or facial droop.  He is mentating appropriately, strength are equal throughout.  -Labs ordered, independently viewed and interpreted by me.  Labs remarkable for mildly elevated white count of 11.7 electrolyte panels are reassuring -The patient was maintained on a cardiac monitor.  I personally viewed and interpreted the cardiac monitored which showed an underlying rhythm of: Sinus tachycardia -Imaging not considered at this time -This patient presents to the ED for concern of sickle cell pain, this involves an extensive number of treatment options, and is a complaint that carries with it a high risk of complications and morbidity.  The differential diagnosis includes sickle cell pain, chronic pain syndrome, muscle skeletal pain -Co morbidities that complicate the patient evaluation includes sickle cell disease, chronic pain syndrome -Treatment includes Toradol , Dilaudid , IV fluid, antiemetic -Reevaluation of the patient after these medicines showed that the patient stayed the same -PCP office notes or outside notes reviewed -Discussion with specialist.[facility] Dr. Chapman who agrees to admit pt -Escalation to  admission/observation considered: pt agreeable with admission.       Final diagnoses:  Sickle cell pain crisis [facility])    ED Discharge Orders     None          Nivia Colon, PA-C 01/13/24 2147    Patt Alm Macho, MD 01/13/24 2203

## 2024-01-13 NOTE — ED Triage Notes (Signed)
 Pt reports bilateral shoulder pain and back pain that started 1 week ago. Pt reports this feels like his sickle cell crisis. Pt reports he has taken 15mg  extended release morphine  and 30mg  immediate release PTA.

## 2024-01-13 NOTE — ED Notes (Signed)
 Unsuccessful IV attempt x2.

## 2024-01-14 LAB — CBC
HCT: 24.5 % — ABNORMAL LOW (ref 39.0–52.0)
Hemoglobin: 8.4 g/dL — ABNORMAL LOW (ref 13.0–17.0)
MCH: 31.6 pg (ref 26.0–34.0)
MCHC: 34.3 g/dL (ref 30.0–36.0)
MCV: 92.1 fL (ref 80.0–100.0)
Platelets: 293 K/uL (ref 150–400)
RBC: 2.66 MIL/uL — ABNORMAL LOW (ref 4.22–5.81)
RDW: 14.2 % (ref 11.5–15.5)
WBC: 10.9 K/uL — ABNORMAL HIGH (ref 4.0–10.5)
nRBC: 0.2 % (ref 0.0–0.2)

## 2024-01-14 MED ORDER — SODIUM CHLORIDE 0.45 % IV SOLN: INTRAVENOUS | Status: AC

## 2024-01-14 MED ORDER — HYDROMORPHONE 1 MG/ML IV SOLN
INTRAVENOUS | Status: DC
  Administered 2024-01-14: 3 mg via INTRAVENOUS
  Administered 2024-01-14: 30 mg via INTRAVENOUS
  Administered 2024-01-14: 2 mg via INTRAVENOUS
  Administered 2024-01-15: 0.5 mg via INTRAVENOUS
  Administered 2024-01-15: 2.5 mg via INTRAVENOUS
  Administered 2024-01-15: 6.5 mL via INTRAVENOUS
  Administered 2024-01-15: 3.5 mg via INTRAVENOUS
  Administered 2024-01-16: 5.5 mg via INTRAVENOUS
  Administered 2024-01-16: 30 mg via INTRAVENOUS
  Administered 2024-01-16: 6.5 mg via INTRAVENOUS
  Administered 2024-01-16: 6 mg via INTRAVENOUS
  Administered 2024-01-17: 1.5 mg via INTRAVENOUS
  Administered 2024-01-17: 5 mg via INTRAVENOUS
  Administered 2024-01-17 – 2024-01-18 (×2): 30 mg via INTRAVENOUS
  Administered 2024-01-19: 6.08 mg via INTRAVENOUS
  Administered 2024-01-19: 1 mg via INTRAVENOUS
  Filled 2024-01-14 (×4): qty 30

## 2024-01-14 NOTE — Plan of Care (Signed)

## 2024-01-14 NOTE — Progress Notes (Signed)
 Patient ID: Malik FORBES [name] [name], male   DOB: [birth year], 48 y.o.   MRN: [id number] Subjective: [name], [name] is a 48 year old male with medical history significant for sickle cell anemia, chronic pain syndrome, presenting to the emergency department with sickle cell related pain crisis.  Patient endorsed having pain across his upper back and shoulder blades ongoing for a little over a week.  Pain is similar to prior sickle cell crisis but not adequately controlled with home medications.  Today patient continues to endorse pain of 8/9 with no significant improvement from 24 hours prior.  He has no new concerns.  Denies subjective fever, cough, headache, nausea, vomiting, diarrhea.  No urinary symptoms.   Objective:  Vital signs in last 24 hours:  Vitals:   01/14/24 1541 01/14/24 1553 01/14/24 1908 01/14/24 1935  BP:    (!) 101/59  Pulse:    61  Resp: 16 16 14 18   Temp:    98.9 F (37.2 C)  TempSrc:    Oral  SpO2: 90% 92% 92% 94%  Weight:      Height:        Intake/Output from previous day:   Intake/Output Summary (Last 24 hours) at 01/14/2024 2230 Last data filed at 01/14/2024 1800 Gross per 24 hour  Intake 1821.23 ml  Output --  Net 1821.23 ml    Physical Exam: General: Alert, awake, oriented x3, in no acute distress.  HEENT: [place]/AT PEERL, EOMI Neck: Trachea midline,  no masses, no thyromegal,y no JVD, no carotid bruit OROPHARYNX:  Moist, No exudate/ erythema/lesions.  Heart: Regular rate and rhythm, without murmurs, rubs, gallops, PMI non-displaced, no heaves or thrills on palpation.  Lungs: Clear to auscultation, no wheezing or rhonchi noted. No increased vocal fremitus resonant to percussion  Abdomen: Soft, nontender, nondistended, positive bowel sounds, no masses no hepatosplenomegaly noted..  Neuro: No focal neurological deficits noted cranial nerves II through XII grossly intact. DTRs 2+ bilaterally upper and lower extremities. Strength 5 out of 5 in bilateral upper  and lower extremities. Musculoskeletal: Bilateral shoulder blade tenderness.  Psychiatric: Patient alert and oriented x3, good insight and cognition, good recent to remote recall. Lymph node survey: No cervical axillary or inguinal lymphadenopathy noted.  Lab Results:  Basic Metabolic Panel:    Component Value Date/Time   NA 139 01/13/2024 1711   K 4.4 01/13/2024 1711   CL 105 01/13/2024 1711   CO2 20 (L) 01/13/2024 1711   BUN 13 01/13/2024 1711   CREATININE 1.00 01/13/2024 1711   CREATININE 0.93 01/02/2022 0000   GLUCOSE 99 01/13/2024 1711   CALCIUM 9.6 01/13/2024 1711   CBC:    Component Value Date/Time   WBC 10.9 (H) 01/14/2024 0418   HGB 8.4 (L) 01/14/2024 0418   HCT 24.5 (L) 01/14/2024 0418   PLT 293 01/14/2024 0418   MCV 92.1 01/14/2024 0418   NEUTROABS 6.6 01/13/2024 1711   LYMPHSABS 2.9 01/13/2024 1711   MONOABS 1.2 (H) 01/13/2024 1711   EOSABS 0.8 (H) 01/13/2024 1711   BASOSABS 0.2 (H) 01/13/2024 1711    No results found for this or any previous visit (from the past 240 hours).  Studies/Results: No results found.  Medications: Scheduled Meds:  buPROPion   150 mg Oral BID   enoxaparin  (LOVENOX ) injection  40 mg Subcutaneous Q24H   HYDROmorphone    Intravenous Q4H   ketorolac   15 mg Intravenous Q6H   linaclotide   145 mcg Oral QAC breakfast   morphine   15 mg Oral Q8H  tiZANidine   4 mg Oral QHS   Continuous Infusions:  sodium chloride  125 mL/hr at 01/14/24 2230   PRN Meds:.diphenhydrAMINE , Glycerin  (Adult), naloxone  **AND** sodium chloride  flush, ondansetron  (ZOFRAN ) IV, ondansetron , polyethylene glycol  Consultants: None  Procedures: None  Antibiotics: None  Assessment/Plan: Active Problems:   Tobacco abuse   Anemia of chronic disease   Leukocytosis   Sickle cell crisis (HCC)   Chronic pain syndrome   Hb Sickle Cell Disease with Pain crisis: Continue IVF 0.45% Saline @ KVO. continue weight based Dilaudid  PCA, IV Toradol  15 mg Q 6 H for a  total of 5 days, continue oral home pain medications as ordered. Monitor vitals very closely, Re-evaluate pain scale regularly, 2 L of Oxygen by [place]. Patient encouraged to ambulate on the hallway today.  Leukocytosis: Slightly elevated mostly due to vaso-occlusive crisis.  Will continue to monitor without antibiotic. Anemia of Chronic Disease: Hemoglobin at baseline. Chronic pain Syndrome: Continue oral home pain medication. Tobacco abuse: Education provided for tobacco cessation.  Code Status: Full Code Family Communication: N/A Disposition Plan: Not yet ready for discharge  Homer CHRISTELLA Cover NP  If 7PM-7AM, please contact night-coverage.  01/14/2024, 10:30 PM  LOS: 1 day

## 2024-01-15 DIAGNOSIS — D638 Anemia in other chronic diseases classified elsewhere: Secondary | ICD-10-CM

## 2024-01-15 DIAGNOSIS — G894 Chronic pain syndrome: Secondary | ICD-10-CM

## 2024-01-15 DIAGNOSIS — Z720 Tobacco use: Secondary | ICD-10-CM

## 2024-01-15 LAB — CBC
HCT: 20.6 % — ABNORMAL LOW (ref 39.0–52.0)
Hemoglobin: 7.5 g/dL — ABNORMAL LOW (ref 13.0–17.0)
MCH: 33.3 pg (ref 26.0–34.0)
MCHC: 36.4 g/dL — ABNORMAL HIGH (ref 30.0–36.0)
MCV: 91.6 fL (ref 80.0–100.0)
Platelets: 255 K/uL (ref 150–400)
RBC: 2.25 MIL/uL — ABNORMAL LOW (ref 4.22–5.81)
RDW: 15 % (ref 11.5–15.5)
WBC: 11.5 K/uL — ABNORMAL HIGH (ref 4.0–10.5)
nRBC: 0 % (ref 0.0–0.2)

## 2024-01-15 MED ORDER — LACTULOSE 10 GM/15ML PO SOLN
30.0000 g | Freq: Two times a day (BID) | ORAL | Status: DC | PRN
  Administered 2024-01-16: 30 g via ORAL
  Filled 2024-01-15: qty 45

## 2024-01-15 NOTE — Plan of Care (Signed)

## 2024-01-15 NOTE — Progress Notes (Signed)
 SICKLE CELL SERVICE PROGRESS NOTE  [name]. FMW:[id number] DOB: 1975/09/16 DOA: 01/13/2024 PCP: Shelda Atlas, MD  Assessment/Plan: Active Problems:   Tobacco abuse   Anemia of chronic disease   Leukocytosis   Sickle cell crisis (HCC)   Chronic pain syndrome  Sickle cell pain crisis: On Dilaudid  PCA, Toradol  and MS Contin .  Continue to adjust medications. Anemia of chronic disease: Continue to monitor H&H Tobacco abuse: Continue tobacco cessation counseling Chronic pain syndrome: Continue MS Contin .  Code Status: Full code Family Communication: No family at bedside Disposition Plan: Home when ready  [facility]  Pager [phone]. If 7PM-7AM, please contact night-coverage.  01/15/2024, 11:15 PM  LOS: 2 days   Brief narrative: Malik [name], [name]. is a 48 year old male with medical history significant for sickle cell anemia, chronic pain syndrome, presenting to the emergency department with sickle cell related pain crisis.  Patient endorsed having pain across his upper back and shoulder blades ongoing for a little over a week.  Pain is similar to prior sickle cell crisis but not adequately controlled with home medications.   Consultants: None  Procedures: Chest x-ray  Antibiotics: None  HPI/Subjective: Patient is complaining of pain still at 8 out of 10.  No fever chills or chills no nausea vomiting or diarrhea.  Objective: Vitals:   01/15/24 1713 01/15/24 1754 01/15/24 1932 01/15/24 1957  BP:  120/72  108/70  Pulse:  (!) 59  71  Resp: 16  17 18   Temp:  98.2 F (36.8 C)  98.4 F (36.9 C)  TempSrc:    Oral  SpO2: 95% 96%  98%  Weight:      Height:       Weight change:   Intake/Output Summary (Last 24 hours) at 01/15/2024 2315 Last data filed at 01/15/2024 0648 Gross per 24 hour  Intake 1551.03 ml  Output --  Net 1551.03 ml    General: Alert, awake, oriented x3, in no acute distress.  HEENT: [place]/AT [place], EOMI Neck: Trachea midline,  no masses,  no thyromegal,y no JVD, no carotid bruit OROPHARYNX:  Moist, No exudate/ erythema/lesions.  Heart: Regular rate and rhythm, without murmurs, rubs, gallops, PMI non-displaced, no heaves or thrills on palpation.  Lungs: Clear to auscultation, no wheezing or rhonchi noted. No increased vocal fremitus resonant to percussion  Abdomen: Soft, nontender, nondistended, positive bowel sounds, no masses no hepatosplenomegaly noted..  Neuro: No focal neurological deficits noted cranial nerves II through XII grossly intact. DTRs 2+ bilaterally upper and lower extremities. Strength 5 out of 5 in bilateral upper and lower extremities. Musculoskeletal: No warm swelling or erythema around joints, no spinal tenderness noted. Psychiatric: Patient alert and oriented x3, good insight and cognition, good recent to remote recall. Lymph node survey: No cervical axillary or inguinal lymphadenopathy noted.   Data Reviewed: Basic Metabolic Panel: Recent Labs  Lab 01/13/24 1711  NA 139  K 4.4  CL 105  CO2 20*  GLUCOSE 99  BUN 13  CREATININE 1.00  CALCIUM 9.6   Liver Function Tests: Recent Labs  Lab 01/13/24 1711  AST 34  ALT 12  ALKPHOS 97  BILITOT 1.3*  PROT 7.9  ALBUMIN 4.4   No results for input(s): LIPASE, AMYLASE in the last 168 hours. No results for input(s): AMMONIA in the last 168 hours. CBC: Recent Labs  Lab 01/13/24 1711 01/14/24 0418 01/15/24 0456  WBC 11.7* 10.9* 11.5*  NEUTROABS 6.6  --   --   HGB 9.0* 8.4* 7.5*  HCT 25.8* 24.5* 20.6*  MCV 92.1 92.1 91.6  PLT 340 293 255   Cardiac Enzymes: No results for input(s): CKTOTAL, CKMB, CKMBINDEX, TROPONINI in the last 168 hours. BNP (last 3 results) No results for input(s): BNP in the last 8760 hours.  ProBNP (last 3 results) No results for input(s): PROBNP in the last 8760 hours.  CBG: No results for input(s): GLUCAP in the last 168 hours.  No results found for this or any previous visit (from the past 240  hours).   Studies: No results found.  Scheduled Meds:  buPROPion   150 mg Oral BID   enoxaparin  (LOVENOX ) injection  40 mg Subcutaneous Q24H   HYDROmorphone    Intravenous Q4H   ketorolac   15 mg Intravenous Q6H   linaclotide   145 mcg Oral QAC breakfast   morphine   15 mg Oral Q8H   tiZANidine   4 mg Oral QHS   Continuous Infusions:  Active Problems:   Tobacco abuse   Anemia of chronic disease   Leukocytosis   Sickle cell crisis (HCC)   Chronic pain syndrome

## 2024-01-15 NOTE — Plan of Care (Signed)
  Problem: Education: Goal: Knowledge of vaso-occlusive preventative measures will improve Outcome: Progressing Goal: Awareness of infection prevention will improve Outcome: Progressing   Problem: Self-Care: Goal: Ability to incorporate actions that prevent/reduce pain crisis will improve Outcome: Progressing   Problem: Bowel/Gastric: Goal: Gut motility will be maintained Outcome: Progressing   Problem: Respiratory: Goal: Pulmonary complications will be avoided or minimized Outcome: Progressing Goal: Acute Chest Syndrome will be identified early to prevent complications Outcome: Progressing   Problem: Sensory: Goal: Pain level will decrease with appropriate interventions Outcome: Progressing   Problem: Activity: Goal: Risk for activity intolerance will decrease Outcome: Progressing   Problem: Nutrition: Goal: Adequate nutrition will be maintained Outcome: Progressing   Problem: Safety: Goal: Ability to remain free from injury will improve Outcome: Progressing

## 2024-01-16 DIAGNOSIS — D72829 Elevated white blood cell count, unspecified: Secondary | ICD-10-CM

## 2024-01-16 LAB — CBC
HCT: 21.4 % — ABNORMAL LOW (ref 39.0–52.0)
Hemoglobin: 7.6 g/dL — ABNORMAL LOW (ref 13.0–17.0)
MCH: 32.5 pg (ref 26.0–34.0)
MCHC: 35.5 g/dL (ref 30.0–36.0)
MCV: 91.5 fL (ref 80.0–100.0)
Platelets: 266 K/uL (ref 150–400)
RBC: 2.34 MIL/uL — ABNORMAL LOW (ref 4.22–5.81)
RDW: 15.2 % (ref 11.5–15.5)
WBC: 11.1 K/uL — ABNORMAL HIGH (ref 4.0–10.5)
nRBC: 0.3 % — ABNORMAL HIGH (ref 0.0–0.2)

## 2024-01-16 NOTE — Plan of Care (Signed)

## 2024-01-16 NOTE — Progress Notes (Signed)
 SICKLE CELL SERVICE PROGRESS NOTE  [name]. FMW:[id number] DOB: Jan 07, 1976 DOA: 01/13/2024 PCP: Shelda Atlas, MD  Assessment/Plan: Active Problems:   Tobacco abuse   Anemia of chronic disease   Leukocytosis   Sickle cell crisis (HCC)   Chronic pain syndrome  Sickle cell pain crisis: On Dilaudid  PCA, Toradol  and MS Contin .  Patient has not been using his medications as prescribed.  He has used only 24 mg of the Dilaudid  in 24 hours.  Counseled patient on using his medications as much as possible.  Per patient he feels nauseated when she places the PCA that is why he has been reluctant to do it.  Patient has medication for nausea and encouraged to ask for it.  Continue to adjust medications. Anemia of chronic disease: Continue to monitor H&H Tobacco abuse: Continue tobacco cessation counseling Chronic pain syndrome: Continue MS Contin .  Code Status: Full code Family Communication: No family at bedside Disposition Plan: Home when ready  [facility]  Pager [phone]. If 7PM-7AM, please contact night-coverage.  01/16/2024, 8:38 PM  LOS: 3 days   Brief narrative: [name], [name]. is a 48 year old male with medical history significant for sickle cell anemia, chronic pain syndrome, presenting to the emergency department with sickle cell related pain crisis.  Patient endorsed having pain across his upper back and shoulder blades ongoing for a little over a week.  Pain is similar to prior sickle cell crisis but not adequately controlled with home medications.   Consultants: None  Procedures: Chest x-ray  Antibiotics: None  HPI/Subjective: Patient is complaining of pain still at 7 out of 10.  Patient has not been using the Dilaudid  PCA adequately.  Only used 24 mg in 24 hours.  No fever chills or chills no nausea vomiting or diarrhea.  Objective: Vitals:   01/16/24 1407 01/16/24 1710 01/16/24 1937 01/16/24 2002  BP: 122/72   124/79  Pulse: 69   67  Resp: 16 18 17     Temp: 98 F (36.7 C)   98.8 F (37.1 C)  TempSrc: Oral   Oral  SpO2: 96% 93% 94% 94%  Weight:      Height:       Weight change:   Intake/Output Summary (Last 24 hours) at 01/16/2024 2038 Last data filed at 01/16/2024 0600 Gross per 24 hour  Intake 240 ml  Output 650 ml  Net -410 ml    General: Alert, awake, oriented x3, in no acute distress.  HEENT: [place]/AT [place], EOMI Neck: Trachea midline,  no masses, no thyromegal,y no JVD, no carotid bruit OROPHARYNX:  Moist, No exudate/ erythema/lesions.  Heart: Regular rate and rhythm, without murmurs, rubs, gallops, PMI non-displaced, no heaves or thrills on palpation.  Lungs: Clear to auscultation, no wheezing or rhonchi noted. No increased vocal fremitus resonant to percussion  Abdomen: Soft, nontender, nondistended, positive bowel sounds, no masses no hepatosplenomegaly noted..  Neuro: No focal neurological deficits noted cranial nerves II through XII grossly intact. DTRs 2+ bilaterally upper and lower extremities. Strength 5 out of 5 in bilateral upper and lower extremities. Musculoskeletal: No warm swelling or erythema around joints, no spinal tenderness noted. Psychiatric: Patient alert and oriented x3, good insight and cognition, good recent to remote recall. Lymph node survey: No cervical axillary or inguinal lymphadenopathy noted.   Data Reviewed: Basic Metabolic Panel: Recent Labs  Lab 01/13/24 1711  NA 139  K 4.4  CL 105  CO2 20*  GLUCOSE 99  BUN 13  CREATININE 1.00  CALCIUM 9.6   Liver Function Tests: Recent Labs  Lab 01/13/24 1711  AST 34  ALT 12  ALKPHOS 97  BILITOT 1.3*  PROT 7.9  ALBUMIN 4.4   No results for input(s): LIPASE, AMYLASE in the last 168 hours. No results for input(s): AMMONIA in the last 168 hours. CBC: Recent Labs  Lab 01/13/24 1711 01/14/24 0418 01/15/24 0456 01/16/24 0515  WBC 11.7* 10.9* 11.5* 11.1*  NEUTROABS 6.6  --   --   --   HGB 9.0* 8.4* 7.5* 7.6*  HCT 25.8* 24.5*  20.6* 21.4*  MCV 92.1 92.1 91.6 91.5  PLT 340 293 255 266   Cardiac Enzymes: No results for input(s): CKTOTAL, CKMB, CKMBINDEX, TROPONINI in the last 168 hours. BNP (last 3 results) No results for input(s): BNP in the last 8760 hours.  ProBNP (last 3 results) No results for input(s): PROBNP in the last 8760 hours.  CBG: No results for input(s): GLUCAP in the last 168 hours.  No results found for this or any previous visit (from the past 240 hours).   Studies: No results found.  Scheduled Meds:  buPROPion   150 mg Oral BID   enoxaparin  (LOVENOX ) injection  40 mg Subcutaneous Q24H   HYDROmorphone    Intravenous Q4H   ketorolac   15 mg Intravenous Q6H   linaclotide   145 mcg Oral QAC breakfast   morphine   15 mg Oral Q8H   tiZANidine   4 mg Oral QHS   Continuous Infusions:  Active Problems:   Tobacco abuse   Anemia of chronic disease   Leukocytosis   Sickle cell crisis (HCC)   Chronic pain syndrome

## 2024-01-16 NOTE — Plan of Care (Signed)
  Problem: Education: Goal: Knowledge of vaso-occlusive preventative measures will improve Outcome: Progressing Goal: Awareness of infection prevention will improve Outcome: Progressing   Problem: Respiratory: Goal: Pulmonary complications will be avoided or minimized Outcome: Progressing Goal: Acute Chest Syndrome will be identified early to prevent complications Outcome: Progressing   Problem: Pain Managment: Goal: General experience of comfort will improve and/or be controlled Outcome: Progressing

## 2024-01-17 LAB — CBC
HCT: 23 % — ABNORMAL LOW (ref 39.0–52.0)
Hemoglobin: 8.2 g/dL — ABNORMAL LOW (ref 13.0–17.0)
MCH: 33.1 pg (ref 26.0–34.0)
MCHC: 35.7 g/dL (ref 30.0–36.0)
MCV: 92.7 fL (ref 80.0–100.0)
Platelets: 275 K/uL (ref 150–400)
RBC: 2.48 MIL/uL — ABNORMAL LOW (ref 4.22–5.81)
RDW: 15.5 % (ref 11.5–15.5)
WBC: 14.5 K/uL — ABNORMAL HIGH (ref 4.0–10.5)
nRBC: 0.3 % — ABNORMAL HIGH (ref 0.0–0.2)

## 2024-01-17 LAB — URINALYSIS, COMPLETE (UACMP) WITH MICROSCOPIC
Bacteria, UA: NONE SEEN
Bilirubin Urine: NEGATIVE
Glucose, UA: NEGATIVE mg/dL
Hgb urine dipstick: NEGATIVE
Ketones, ur: NEGATIVE mg/dL
Leukocytes,Ua: NEGATIVE
Nitrite: NEGATIVE
Protein, ur: NEGATIVE mg/dL
Specific Gravity, Urine: 1.012 (ref 1.005–1.030)
pH: 5 (ref 5.0–8.0)

## 2024-01-17 LAB — LACTATE DEHYDROGENASE: LDH: 262 U/L — ABNORMAL HIGH (ref 98–192)

## 2024-01-17 LAB — LACTIC ACID, PLASMA: Lactic Acid, Venous: 0.7 mmol/L (ref 0.5–1.9)

## 2024-01-17 NOTE — Progress Notes (Cosign Needed)
 Patient ID: Malik FORBES [name], male   DOB: 11-12-1975, 48 y.o.   MRN: [id number] Subjective: Malik [name], [name] is a 48 year old male with medical history significant for sickle cell anemia, chronic pain syndrome, presenting to the emergency department with sickle cell related pain crisis.  Patient endorsed having pain across his upper back and shoulder blades ongoing for a little over a week.  Pain is similar to prior sickle cell crisis but not adequately controlled with home medications.  Today patient reports slight Improvement to pain at 7/9.  He has no new concerns.  Denies subjective fever, cough, headache, nausea, vomiting, diarrhea.  No urinary symptoms.   Objective:  Vital signs in last 24 hours:  Vitals:   01/17/24 0406 01/17/24 0516 01/17/24 0617 01/17/24 0807  BP:  114/80  129/76  Pulse:  66  62  Resp: 16  17 16   Temp:  98.5 F (36.9 C)  98.2 F (36.8 C)  TempSrc:  Oral    SpO2: 92% 94% 94% 95%  Weight:      Height:        Intake/Output from previous day:   Intake/Output Summary (Last 24 hours) at 01/17/2024 0942 Last data filed at 01/16/2024 2300 Gross per 24 hour  Intake 120 ml  Output 650 ml  Net -530 ml    Physical Exam: General: Alert, awake, oriented x3, in no acute distress.  HEENT: [place]/AT [place], EOMI Neck: Trachea midline,  no masses, no thyromegal,y no JVD, no carotid bruit OROPHARYNX:  Moist, No exudate/ erythema/lesions.  Heart: Regular rate and rhythm, without murmurs, rubs, gallops, PMI non-displaced, no heaves or thrills on palpation.  Lungs: Clear to auscultation, no wheezing or rhonchi noted. No increased vocal fremitus resonant to percussion  Abdomen: Soft, nontender, nondistended, positive bowel sounds, no masses no hepatosplenomegaly noted..  Neuro: No focal neurological deficits noted cranial nerves II through XII grossly intact. DTRs 2+ bilaterally upper and lower extremities. Strength 5 out of 5 in bilateral upper and lower  extremities. Musculoskeletal: Bilateral shoulder blade tenderness improving  Psychiatric: Patient alert and oriented x3, good insight and cognition, good recent to remote recall. Lymph node survey: No cervical axillary or inguinal lymphadenopathy noted.  Lab Results:  Basic Metabolic Panel:    Component Value Date/Time   NA 139 01/13/2024 1711   K 4.4 01/13/2024 1711   CL 105 01/13/2024 1711   CO2 20 (L) 01/13/2024 1711   BUN 13 01/13/2024 1711   CREATININE 1.00 01/13/2024 1711   CREATININE 0.93 01/02/2022 0000   GLUCOSE 99 01/13/2024 1711   CALCIUM 9.6 01/13/2024 1711   CBC:    Component Value Date/Time   WBC 14.5 (H) 01/17/2024 0719   HGB 8.2 (L) 01/17/2024 0719   HCT 23.0 (L) 01/17/2024 0719   PLT 275 01/17/2024 0719   MCV 92.7 01/17/2024 0719   NEUTROABS 6.6 01/13/2024 1711   LYMPHSABS 2.9 01/13/2024 1711   MONOABS 1.2 (H) 01/13/2024 1711   EOSABS 0.8 (H) 01/13/2024 1711   BASOSABS 0.2 (H) 01/13/2024 1711    No results found for this or any previous visit (from the past 240 hours).  Studies/Results: No results found.  Medications: Scheduled Meds:  buPROPion   150 mg Oral BID   enoxaparin  (LOVENOX ) injection  40 mg Subcutaneous Q24H   HYDROmorphone    Intravenous Q4H   ketorolac   15 mg Intravenous Q6H   linaclotide   145 mcg Oral QAC breakfast   morphine   15 mg Oral Q8H   tiZANidine   4 mg Oral QHS   Continuous Infusions:   PRN Meds:.diphenhydrAMINE , Glycerin  (Adult), lactulose , naloxone  **AND** sodium chloride  flush, ondansetron  (ZOFRAN ) IV, ondansetron   Consultants: None  Procedures: None  Antibiotics: None  Assessment/Plan: Active Problems:   Tobacco abuse   Anemia of chronic disease   Leukocytosis   Sickle cell crisis (HCC)   Chronic pain syndrome   Hb Sickle Cell Disease with Pain crisis: Continue IVF 0.45% Saline @ KVO. continue weight based Dilaudid  PCA, IV Toradol  15 mg Q 6 H for a total of 5 days, continue oral home pain medications as  ordered. Monitor vitals very closely, Re-evaluate pain scale regularly, 2 L of Oxygen by [place]. Patient encouraged to ambulate on the hallway today.  Leukocytosis: Slightly elevated mostly due to vaso-occlusive crisis.  Will continue to monitor without antibiotic. Anemia of Chronic Disease: Hemoglobin at baseline. Chronic pain Syndrome: Continue oral home pain medication. Tobacco abuse: smoking cessation provided  Code Status: Full Code Family Communication: N/A Disposition Plan: Not yet ready for discharge  Homer CHRISTELLA Cover NP  If 7PM-7AM, please contact night-coverage.  01/17/2024, 9:42 AM  LOS: 4 days

## 2024-01-17 NOTE — Plan of Care (Signed)
  Problem: Respiratory: Goal: Acute Chest Syndrome will be identified early to prevent complications Outcome: Progressing   Problem: Sensory: Goal: Pain level will decrease with appropriate interventions Outcome: Progressing   Problem: Health Behavior: Goal: Postive changes in compliance with treatment and prescription regimens will improve Outcome: Progressing   Problem: Clinical Measurements: Goal: Respiratory complications will improve Outcome: Progressing   Problem: Pain Managment: Goal: General experience of comfort will improve and/or be controlled Outcome: Progressing

## 2024-01-17 NOTE — Plan of Care (Signed)

## 2024-01-17 NOTE — Progress Notes (Signed)
   01/17/24 1657  TOC Brief Assessment  Insurance and Status Reviewed  Patient has primary care physician Yes  Home environment has been reviewed Yes  Prior level of function: Independent  Prior/Current Home Services No current home services  Social Drivers of Health Review SDOH reviewed no interventions necessary  Readmission risk has been reviewed Yes  Transition of care needs no transition of care needs at this time

## 2024-01-18 LAB — HEMOGLOBIN AND HEMATOCRIT, BLOOD
HCT: 26.5 % — ABNORMAL LOW (ref 39.0–52.0)
Hemoglobin: 9.3 g/dL — ABNORMAL LOW (ref 13.0–17.0)

## 2024-01-18 LAB — PREPARE RBC (CROSSMATCH)

## 2024-01-18 MED ORDER — SODIUM CHLORIDE 0.9% IV SOLUTION: Freq: Once | INTRAVENOUS | Status: AC

## 2024-01-18 MED ORDER — ACETAMINOPHEN 325 MG PO TABS
650.0000 mg | ORAL_TABLET | Freq: Once | ORAL | Status: AC
  Administered 2024-01-18: 650 mg via ORAL
  Filled 2024-01-18: qty 2

## 2024-01-18 MED ORDER — DIPHENHYDRAMINE HCL 25 MG PO CAPS
25.0000 mg | ORAL_CAPSULE | Freq: Once | ORAL | Status: AC
  Administered 2024-01-18: 25 mg via ORAL
  Filled 2024-01-18: qty 1

## 2024-01-18 NOTE — Plan of Care (Signed)

## 2024-01-18 NOTE — Plan of Care (Signed)
  Problem: Education: Goal: Knowledge of vaso-occlusive preventative measures will improve Outcome: Progressing   Problem: Self-Care: Goal: Ability to incorporate actions that prevent/reduce pain crisis will improve Outcome: Progressing   Problem: Bowel/Gastric: Goal: Gut motility will be maintained Outcome: Progressing   Problem: Respiratory: Goal: Pulmonary complications will be avoided or minimized Outcome: Progressing   Problem: Nutrition: Goal: Adequate nutrition will be maintained Outcome: Progressing   Problem: Elimination: Goal: Will not experience complications related to bowel motility Outcome: Progressing   Problem: Pain Managment: Goal: General experience of comfort will improve and/or be controlled Outcome: Progressing   Problem: Safety: Goal: Ability to remain free from injury will improve Outcome: Progressing

## 2024-01-18 NOTE — Progress Notes (Signed)
   01/18/24 1415 01/18/24 1421  Pre-Transfusion Documentation  Blood Consent Obtained Yes  --   Blood Consent Date 01/18/24  --   History of previous reaction? No  --   Pre-Meds Given? Yes  --   Patient education Done  --   Vitals  Vital Signs Type (Include Temp, Pulse, RR, and B/P) Pre-blood (within 30 minutes)  --   Temp 97.7 F (36.5 C)  --   Temp Source Oral  --   Pulse Rate 64  --   Resp 11  --   BP 124/85  --   Oxygen Therapy  SpO2 99 %  --   O2 Device Nasal Cannula  --   O2 Flow Rate (L/min) 2 L/min  --   Transfuse RBC  Blood Admin Supplies  --  Y set with filter

## 2024-01-18 NOTE — Progress Notes (Signed)
 Patient ID: Malik FORBES [name] [name], male   DOB: [birth year], 48 y.o.   MRN: [id number] Subjective: [name], [name] is a 48 year old male with medical history significant for sickle cell anemia, chronic pain syndrome, presenting to the emergency department with sickle cell related pain crisis.  Patient endorsed having pain across his upper back and shoulder blades ongoing for a little over a week.  Pain is similar to prior sickle cell crisis but not adequately controlled with home medications.  Patient no improvement to pain.  Patient rates pain at 8/10 in bilateral upper shoulder.  He has no new concerns.  Denies subjective fever, cough, headache, nausea, vomiting, diarrhea.  No urinary symptoms.   Objective:  Vital signs in last 24 hours:  Vitals:   01/18/24 0410 01/18/24 0742 01/18/24 0744 01/18/24 1152  BP:      Pulse:      Resp: 16 18 18 12   Temp:      TempSrc:      SpO2: 96%  97% 97%  Weight:      Height:        Intake/Output from previous day:   Intake/Output Summary (Last 24 hours) at 01/18/2024 1209 Last data filed at 01/17/2024 1700 Gross per 24 hour  Intake 240 ml  Output --  Net 240 ml    Physical Exam: General: Alert, awake, oriented x3, in no acute distress.  HEENT: [place]/AT [place]ER[place], EOMI Neck: Trachea midline,  no masses, no thyromegal,y no JVD, no carotid bruit OROPHARYNX:  Moist, No exudate/ erythema/lesions.  Heart: Regular rate and rhythm, without murmurs, rubs, gallops, PMI non-displaced, no heaves or thrills on palpation.  Lungs: Clear to auscultation, no wheezing or rhonchi noted. No increased vocal fremitus resonant to percussion  Abdomen: Soft, nontender, nondistended, positive bowel sounds, no masses no hepatosplenomegaly noted..  Neuro: No focal neurological deficits noted cranial nerves II through XII grossly intact. DTRs 2+ bilaterally upper and lower extremities. Strength 5 out of 5 in bilateral upper and lower extremities. Musculoskeletal: Bilateral  shoulder blade tenderness improving  Psychiatric: Patient alert and oriented x3, good insight and cognition, good recent to remote recall. Lymph node survey: No cervical axillary or inguinal lymphadenopathy noted.  Lab Results:  Basic Metabolic Panel:    Component Value Date/Time   NA 139 01/13/2024 1711   K 4.4 01/13/2024 1711   CL 105 01/13/2024 1711   CO2 20 (L) 01/13/2024 1711   BUN 13 01/13/2024 1711   CREATININE 1.00 01/13/2024 1711   CREATININE 0.93 01/02/2022 0000   GLUCOSE 99 01/13/2024 1711   CALCIUM 9.6 01/13/2024 1711   CBC:    Component Value Date/Time   WBC 14.5 (H) 01/17/2024 0719   HGB 8.2 (L) 01/17/2024 0719   HCT 23.0 (L) 01/17/2024 0719   PLT 275 01/17/2024 0719   MCV 92.7 01/17/2024 0719   NEUTROABS 6.6 01/13/2024 1711   LYMPHSABS 2.9 01/13/2024 1711   MONOABS 1.2 (H) 01/13/2024 1711   EOSABS 0.8 (H) 01/13/2024 1711   BASOSABS 0.2 (H) 01/13/2024 1711    No results found for this or any previous visit (from the past 240 hours).  Studies/Results: No results found.  Medications: Scheduled Meds:  sodium chloride    Intravenous Once   acetaminophen   650 mg Oral Once   buPROPion   150 mg Oral BID   diphenhydrAMINE   25 mg Oral Once   enoxaparin  (LOVENOX ) injection  40 mg Subcutaneous Q24H   HYDROmorphone    Intravenous Q4H   ketorolac   15 mg  Intravenous Q6H   linaclotide   145 mcg Oral QAC breakfast   morphine   15 mg Oral Q8H   tiZANidine   4 mg Oral QHS   Continuous Infusions:   PRN Meds:.diphenhydrAMINE , Glycerin  (Adult), lactulose , naloxone  **AND** sodium chloride  flush, ondansetron  (ZOFRAN ) IV, ondansetron   Consultants: None  Procedures: Exchange transfusion.  Antibiotics: None  Assessment/Plan: Active Problems:   Tobacco abuse   Anemia of chronic disease   Leukocytosis   Sickle cell crisis (HCC)   Chronic pain syndrome   Hb Sickle Cell Disease with Pain crisis: pain is not improving, therapeutic exchange blood transfusion.   Remove for 50 cc and transfuse 2 unit packed red blood cells.  Continue IVF 0.45% Saline @ KVO. continue weight based Dilaudid  PCA, IV Toradol  15 mg Q 6 H for a total of 5 days, continue oral home pain medications as ordered. Monitor vitals very closely, Re-evaluate pain scale regularly, 2 L of Oxygen by [place]. Patient encouraged to ambulate on the hallway today.  Leukocytosis: Slightly elevated, multifactorial.  All labs returned negative for infection..  Will continue to monitor without antibiotic. Anemia of Chronic Disease: Hemoglobin at baseline. Chronic pain Syndrome: Continue oral home pain medication. Tobacco abuse: smoking cessation provided  Code Status: Full Code Family Communication: N/A Disposition Plan: Not yet ready for discharge  Homer CHRISTELLA Cover NP  If 7PM-7AM, please contact night-coverage.  01/18/2024, 12:09 PM  LOS: 5 days

## 2024-01-18 NOTE — Progress Notes (Signed)
   01/18/24 1436  Vitals  Vital Signs Type (Include Temp, Pulse, RR, and B/P) 15 min. post blood start  Temp 98.1 F (36.7 C)  Temp Source Oral  Pulse Rate 66  Resp 14  BP 118/80  Oxygen Therapy  SpO2 99 %  O2 Device Nasal Cannula  O2 Flow Rate (L/min) 2 L/min  Patient Activity (if Appropriate) In bed  Pulse Oximetry Type Intermittent   Pt awake. No complaints

## 2024-01-19 LAB — BPAM RBC
Blood Product Expiration Date: 202510252359
Blood Product Expiration Date: 202510252359
ISSUE DATE / TIME: 202509231413
ISSUE DATE / TIME: 202509231656
Unit Type and Rh: 5100
Unit Type and Rh: 5100

## 2024-01-19 LAB — TYPE AND SCREEN
ABO/RH(D): O POS
Antibody Screen: NEGATIVE
Donor AG Type: NEGATIVE
Donor AG Type: NEGATIVE
Unit division: 0
Unit division: 0

## 2024-01-19 MED ORDER — MORPHINE SULFATE 15 MG PO TABS
30.0000 mg | ORAL_TABLET | Freq: Every day | ORAL | Status: DC | PRN
Start: 2024-01-19 — End: 2024-01-19

## 2024-01-19 NOTE — Plan of Care (Signed)
  Problem: Education: Goal: Knowledge of vaso-occlusive preventative measures will improve Outcome: Progressing   Problem: Education: Goal: Awareness of infection prevention will improve Outcome: Progressing

## 2024-01-19 NOTE — Progress Notes (Signed)
 Erroneous encounter

## 2024-01-19 NOTE — Discharge Summary (Cosign Needed)
 Physician Discharge Summary  [name]. FMW:[id number] DOB: [birth year] DOA: 01/13/2024  PCP: Shelda Atlas, MD  Admit date: 01/13/2024  Discharge date: 01/20/2024  Discharge Diagnoses:  Active Problems:   Tobacco abuse   Anemia of chronic disease   Leukocytosis   Sickle cell crisis (HCC)   Chronic pain syndrome   Discharge Condition: Stable  Disposition:  Pt is discharged home in good condition and is to follow up with Avbuere, Edwin, MD this week to have labs evaluated. [name]. is instructed to increase activity slowly and balance with rest for the next few days, and use prescribed medication to complete treatment of pain  Diet: Regular Wt Readings from Last 3 Encounters:  01/13/24 79.4 kg  06/04/23 83.9 kg  06/27/22 81.6 kg    History of present illness:  [name]. is a 48 y.o. male with medical history significant sickle cell anemia, chronic pain syndrome who presents with pain in both shoulders. The patient has had increased pain for over a week but has used his usual techniques of hot baths and pushing fluids and his as needed narcotic pain medication to try to control it at home.  Eventually he felt that everything he can do at home was just not working so he presented to the ED. He denies any chest pain he denies any shortness of breath he denies any fever or diarrhea or cold symptoms.  His last hospitalization for sickle cell crisis was 7 months ago.   ED Course:  Patient was treated with IV pain medicine, IV fluid with no resolution to pain symptoms.  Patient was admitted for ongoing sickle cell pain management. Blood pressure 117/75, pulse 100, respiration 18, temperature 98, SpO2 97%, weight 79.4, height 6'2' WBC 11.7, hemoglobin 9.0, immature Retic Fract 27.7  Hospital Course:  Patient was admitted for sickle cell pain crisis and managed appropriately with IVF, IV Dilaudid  via PCA and IV Toradol , as well as other adjunct therapies per sickle  cell pain management protocols.  Patient underwent simple exchange transfusion with 2 units of PRBCs transfused after removing 450 ml by therapeutic phlebotomy for uncontrolled pain. Patient tolerated procedure well. Today patient is reporting pain of 3/10 significant improvement from yesterday. He is ambulating without assistance or any significant pain and tolerating p.o. intake with no restriction.  Patient asked to be discharged today.  Patient was therefore discharged home today in a hemodynamically stable condition.   [name] will follow-up with PCP within 1 week of this discharge. [name] was counseled extensively about nonpharmacologic means of pain management, patient verbalized understanding and was appreciative of  the care received during this admission.   We discussed the need for good hydration, monitoring of hydration status, avoidance of heat, cold, stress, and infection triggers. We discussed the need to be adherent with taking home medications. Patient was reminded of the need to seek medical attention immediately if any symptom of bleeding, anemia, or infection occurs.  Discharge Exam: Vitals:   01/19/24 0453 01/19/24 0855  BP: 121/79   Pulse: (!) 57   Resp: 15 10  Temp: 97.8 F (36.6 C)   SpO2: 96% 95%   Vitals:   01/19/24 0003 01/19/24 0406 01/19/24 0453 01/19/24 0855  BP:   121/79   Pulse:   (!) 57   Resp: 11 15 15 10   Temp:   97.8 F (36.6 C)   TempSrc:      SpO2: 98% 96% 96% 95%  Weight:  Height:        General appearance : Awake, alert, not in any distress. Speech Clear. Not toxic looking HEENT: Atraumatic and Normocephalic, pupils equally reactive to light and accomodation Neck: Supple, no JVD. No cervical lymphadenopathy.  Chest: Good air entry bilaterally, no added sounds  CVS: S1 S2 regular, no murmurs.  Abdomen: Bowel sounds present, Non tender and not distended with no gaurding, rigidity or rebound. Extremities: B/L Lower Ext shows no edema, both  legs are warm to touch Neurology: Awake alert, and oriented X 3, CN II-XII intact, Non focal Skin: No Rash  Discharge Instructions  Discharge Instructions     Call MD for:  severe uncontrolled pain   Complete by: As directed    Call MD for:  temperature >100.4   Complete by: As directed    Diet - low sodium heart healthy   Complete by: As directed    Increase activity slowly   Complete by: As directed       Allergies as of 01/19/2024   No Known Allergies      Medication List     TAKE these medications    amoxicillin -clavulanate 875-125 MG tablet Commonly known as: AUGMENTIN  Take 1 tablet by mouth 2 (two) times daily.   buPROPion  150 MG 12 hr tablet Commonly known as: ZYBAN  Take 150 mg by mouth 2 (two) times daily.   diphenhydrAMINE  25 MG tablet Commonly known as: BENADRYL  Take 25 mg by mouth every 6 (six) hours as needed for itching.   ibuprofen  800 MG tablet Commonly known as: ADVIL  Take 800 mg by mouth 3 (three) times daily as needed for mild pain (pain score 1-3).   Linzess  145 MCG Caps capsule Generic drug: linaclotide  Take 145 mcg by mouth every other day.   morphine  30 MG tablet Commonly known as: MSIR Take 1 tablet (30 mg total) by mouth daily as needed for severe pain (pain). Resume after completing dilaudid . What changed:  when to take this additional instructions   morphine  15 MG 12 hr tablet Commonly known as: MS CONTIN  Take 15 mg by mouth every 8 (eight) hours. What changed: Another medication with the same name was changed. Make sure you understand how and when to take each.   naproxen  500 MG tablet Commonly known as: NAPROSYN  Take 1 tablet (500 mg total) by mouth 2 (two) times daily with a meal.   Narcan  4 MG/0.1ML Liqd nasal spray kit Generic drug: naloxone  Place 4 mg into the nose once as needed (for an accidental overdose).   NON FORMULARY Place 3 drops under the tongue See admin instructions. Carlyle Lugols Iodine 2 Percent  Potassium Iodide and Iodine Solution 2% Liquid Drops - Place 3 drops under the tongue once a day   promethazine  25 MG tablet Commonly known as: PHENERGAN  Take 25 mg by mouth See admin instructions. Take 25 mg by mouth with every dose of morphine  that is taken   tiZANidine  4 MG tablet Commonly known as: ZANAFLEX  Take 4 mg by mouth at bedtime.   VITAMIN C GUMMIES PO Take 1 tablet by mouth See admin instructions. Chew 1 gummie by mouth once a day   VITAMIN D3 PO Take 1 capsule by mouth daily.        The results of significant diagnostics from this hospitalization (including imaging, microbiology, ancillary and laboratory) are listed below for reference.    Significant Diagnostic Studies: No results found.  Microbiology: No results found for this or any previous visit (from the  past 240 hours).   Labs: Basic Metabolic Panel: Recent Labs  Lab 01/13/24 1711  NA 139  K 4.4  CL 105  CO2 20*  GLUCOSE 99  BUN 13  CREATININE 1.00  CALCIUM 9.6   Liver Function Tests: Recent Labs  Lab 01/13/24 1711  AST 34  ALT 12  ALKPHOS 97  BILITOT 1.3*  PROT 7.9  ALBUMIN 4.4   No results for input(s): LIPASE, AMYLASE in the last 168 hours. No results for input(s): AMMONIA in the last 168 hours. CBC: Recent Labs  Lab 01/13/24 1711 01/14/24 0418 01/15/24 0456 01/16/24 0515 01/17/24 0719 01/18/24 2058  WBC 11.7* 10.9* 11.5* 11.1* 14.5*  --   NEUTROABS 6.6  --   --   --   --   --   HGB 9.0* 8.4* 7.5* 7.6* 8.2* 9.3*  HCT 25.8* 24.5* 20.6* 21.4* 23.0* 26.5*  MCV 92.1 92.1 91.6 91.5 92.7  --   PLT 340 293 255 266 275  --    Cardiac Enzymes: No results for input(s): CKTOTAL, CKMB, CKMBINDEX, TROPONINI in the last 168 hours. BNP: Invalid input(s): POCBNP CBG: No results for input(s): GLUCAP in the last 168 hours.  Time coordinating discharge: 50 minutes  Signed:  Homer CHRISTELLA Cover NP  [facility] 01/20/2024, 2:11 PM  Evaluation and  management procedures were performed by the Advanced Practitioner under my supervision and collaboration. I have reviewed the Advanced Practitioner's note and chart, and I agree with the management and plan.   Olugbemiga Jegede, MD, MHA, CPE, GENI PERFECT [facility] [place], [place] [phone] 01/20/2024, 2:34 PM
